# Patient Record
Sex: Female | Born: 1965
Health system: Southern US, Community
[De-identification: ages and names within clinical notes are randomized; demographics above are authoritative.]

## PROBLEM LIST (undated history)

## (undated) DIAGNOSIS — N840 Polyp of corpus uteri: Secondary | ICD-10-CM

## (undated) DIAGNOSIS — N816 Rectocele: Secondary | ICD-10-CM

## (undated) DIAGNOSIS — I10 Essential (primary) hypertension: Secondary | ICD-10-CM

## (undated) DIAGNOSIS — I471 Supraventricular tachycardia: Secondary | ICD-10-CM

## (undated) DIAGNOSIS — T4145XA Adverse effect of unspecified anesthetic, initial encounter: Secondary | ICD-10-CM

## (undated) DIAGNOSIS — T8859XA Other complications of anesthesia, initial encounter: Secondary | ICD-10-CM

## (undated) DIAGNOSIS — I4719 Other supraventricular tachycardia: Secondary | ICD-10-CM

## (undated) DIAGNOSIS — E785 Hyperlipidemia, unspecified: Secondary | ICD-10-CM

## (undated) DIAGNOSIS — Z9889 Other specified postprocedural states: Secondary | ICD-10-CM

## (undated) DIAGNOSIS — R112 Nausea with vomiting, unspecified: Secondary | ICD-10-CM

## (undated) DIAGNOSIS — I4891 Unspecified atrial fibrillation: Secondary | ICD-10-CM

## (undated) DIAGNOSIS — I639 Cerebral infarction, unspecified: Secondary | ICD-10-CM

## (undated) DIAGNOSIS — C801 Malignant (primary) neoplasm, unspecified: Secondary | ICD-10-CM

## (undated) HISTORY — PX: COLONOSCOPY: SHX174

## (undated) HISTORY — DX: Other supraventricular tachycardia: I47.19

## (undated) HISTORY — DX: Unspecified atrial fibrillation: I48.91

## (undated) HISTORY — PX: LEEP: SHX91

## (undated) HISTORY — DX: Rectocele: N81.6

## (undated) HISTORY — DX: Cerebral infarction, unspecified: I63.9

## (undated) HISTORY — DX: Polyp of corpus uteri: N84.0

## (undated) HISTORY — DX: Essential (primary) hypertension: I10

## (undated) HISTORY — DX: Supraventricular tachycardia: I47.1

## (undated) HISTORY — PX: TUBAL LIGATION: SHX77

## (undated) HISTORY — PX: HYSTEROSCOPY: SHX211

## (undated) HISTORY — DX: Hyperlipidemia, unspecified: E78.5

## (undated) HISTORY — PX: OTHER SURGICAL HISTORY: SHX169

## (undated) HISTORY — PX: WISDOM TOOTH EXTRACTION: SHX21

---

## 1998-04-06 ENCOUNTER — Ambulatory Visit (HOSPITAL_COMMUNITY): Admission: RE | Admit: 1998-04-06 | Discharge: 1998-04-06 | Payer: Self-pay | Admitting: Emergency Medicine

## 1998-08-11 ENCOUNTER — Other Ambulatory Visit: Admission: RE | Admit: 1998-08-11 | Discharge: 1998-08-11 | Payer: Self-pay | Admitting: Obstetrics and Gynecology

## 1999-03-10 ENCOUNTER — Other Ambulatory Visit: Admission: RE | Admit: 1999-03-10 | Discharge: 1999-03-10 | Payer: Self-pay | Admitting: Obstetrics and Gynecology

## 1999-07-14 ENCOUNTER — Encounter: Admission: RE | Admit: 1999-07-14 | Discharge: 1999-10-12 | Payer: Self-pay | Admitting: Obstetrics and Gynecology

## 1999-09-23 ENCOUNTER — Inpatient Hospital Stay (HOSPITAL_COMMUNITY): Admission: AD | Admit: 1999-09-23 | Discharge: 1999-09-26 | Payer: Self-pay | Admitting: Obstetrics and Gynecology

## 1999-09-28 ENCOUNTER — Encounter: Admission: RE | Admit: 1999-09-28 | Discharge: 1999-12-27 | Payer: Self-pay | Admitting: Obstetrics and Gynecology

## 1999-11-13 ENCOUNTER — Other Ambulatory Visit: Admission: RE | Admit: 1999-11-13 | Discharge: 1999-11-13 | Payer: Self-pay | Admitting: Obstetrics and Gynecology

## 2000-12-15 ENCOUNTER — Other Ambulatory Visit: Admission: RE | Admit: 2000-12-15 | Discharge: 2000-12-15 | Payer: Self-pay | Admitting: Obstetrics and Gynecology

## 2002-01-23 ENCOUNTER — Other Ambulatory Visit: Admission: RE | Admit: 2002-01-23 | Discharge: 2002-01-23 | Payer: Self-pay | Admitting: Obstetrics and Gynecology

## 2002-04-28 ENCOUNTER — Emergency Department (HOSPITAL_COMMUNITY): Admission: EM | Admit: 2002-04-28 | Discharge: 2002-04-29 | Payer: Self-pay | Admitting: Emergency Medicine

## 2003-03-27 ENCOUNTER — Other Ambulatory Visit: Admission: RE | Admit: 2003-03-27 | Discharge: 2003-03-27 | Payer: Self-pay | Admitting: Obstetrics and Gynecology

## 2003-09-09 ENCOUNTER — Ambulatory Visit (HOSPITAL_COMMUNITY): Admission: RE | Admit: 2003-09-09 | Discharge: 2003-09-09 | Payer: Self-pay | Admitting: Orthopedic Surgery

## 2004-04-23 ENCOUNTER — Other Ambulatory Visit: Admission: RE | Admit: 2004-04-23 | Discharge: 2004-04-23 | Payer: Self-pay | Admitting: Obstetrics and Gynecology

## 2004-05-19 ENCOUNTER — Encounter: Admission: RE | Admit: 2004-05-19 | Discharge: 2004-05-19 | Payer: Self-pay | Admitting: Surgery

## 2005-05-27 ENCOUNTER — Inpatient Hospital Stay (HOSPITAL_COMMUNITY): Admission: EM | Admit: 2005-05-27 | Discharge: 2005-05-27 | Payer: Self-pay | Admitting: Emergency Medicine

## 2005-05-27 ENCOUNTER — Encounter (INDEPENDENT_AMBULATORY_CARE_PROVIDER_SITE_OTHER): Payer: Self-pay | Admitting: Cardiology

## 2005-06-09 ENCOUNTER — Other Ambulatory Visit: Admission: RE | Admit: 2005-06-09 | Discharge: 2005-06-09 | Payer: Self-pay | Admitting: Obstetrics and Gynecology

## 2005-09-29 ENCOUNTER — Ambulatory Visit (HOSPITAL_COMMUNITY): Admission: RE | Admit: 2005-09-29 | Discharge: 2005-09-29 | Payer: Self-pay | Admitting: Orthopedic Surgery

## 2007-02-12 ENCOUNTER — Emergency Department (HOSPITAL_COMMUNITY): Admission: EM | Admit: 2007-02-12 | Discharge: 2007-02-12 | Payer: Self-pay | Admitting: Emergency Medicine

## 2007-05-23 ENCOUNTER — Ambulatory Visit: Payer: Self-pay | Admitting: Internal Medicine

## 2007-05-23 LAB — CONVERTED CEMR LAB
Basophils Absolute: 0 10*3/uL (ref 0.0–0.1)
Chloride: 104 meq/L (ref 96–112)
GFR calc non Af Amer: 73 mL/min
INR: 1 (ref 0.8–1.0)
Lymphocytes Relative: 39.7 % (ref 12.0–46.0)
MCHC: 33 g/dL (ref 30.0–36.0)
MCV: 87.5 fL (ref 78.0–100.0)
Monocytes Absolute: 0.4 10*3/uL (ref 0.1–1.0)
Neutro Abs: 3.8 10*3/uL (ref 1.4–7.7)
Potassium: 3.5 meq/L (ref 3.5–5.1)
RBC: 4.52 M/uL (ref 3.87–5.11)
RDW: 12.6 % (ref 11.5–14.6)
Sodium: 139 meq/L (ref 135–145)

## 2007-05-30 ENCOUNTER — Ambulatory Visit: Payer: Self-pay | Admitting: Internal Medicine

## 2007-05-30 ENCOUNTER — Ambulatory Visit (HOSPITAL_COMMUNITY): Admission: RE | Admit: 2007-05-30 | Discharge: 2007-05-30 | Payer: Self-pay | Admitting: Internal Medicine

## 2007-06-23 ENCOUNTER — Ambulatory Visit: Payer: Self-pay | Admitting: Internal Medicine

## 2007-07-27 ENCOUNTER — Ambulatory Visit: Payer: Self-pay | Admitting: Internal Medicine

## 2008-02-05 ENCOUNTER — Emergency Department (HOSPITAL_COMMUNITY): Admission: EM | Admit: 2008-02-05 | Discharge: 2008-02-05 | Payer: Self-pay | Admitting: Family Medicine

## 2008-03-07 ENCOUNTER — Ambulatory Visit: Payer: Self-pay | Admitting: Internal Medicine

## 2008-06-20 ENCOUNTER — Encounter (INDEPENDENT_AMBULATORY_CARE_PROVIDER_SITE_OTHER): Payer: Self-pay | Admitting: Obstetrics and Gynecology

## 2008-06-20 ENCOUNTER — Ambulatory Visit (HOSPITAL_COMMUNITY): Admission: RE | Admit: 2008-06-20 | Discharge: 2008-06-21 | Payer: Self-pay | Admitting: Obstetrics and Gynecology

## 2008-10-20 DIAGNOSIS — N840 Polyp of corpus uteri: Secondary | ICD-10-CM | POA: Insufficient documentation

## 2008-10-20 DIAGNOSIS — I10 Essential (primary) hypertension: Secondary | ICD-10-CM | POA: Insufficient documentation

## 2008-10-20 DIAGNOSIS — N816 Rectocele: Secondary | ICD-10-CM | POA: Insufficient documentation

## 2008-10-20 DIAGNOSIS — I480 Paroxysmal atrial fibrillation: Secondary | ICD-10-CM | POA: Insufficient documentation

## 2008-10-25 ENCOUNTER — Ambulatory Visit: Payer: Self-pay | Admitting: Internal Medicine

## 2009-04-09 ENCOUNTER — Emergency Department (HOSPITAL_COMMUNITY): Admission: EM | Admit: 2009-04-09 | Discharge: 2009-04-09 | Payer: Self-pay | Admitting: Emergency Medicine

## 2009-05-21 ENCOUNTER — Ambulatory Visit: Payer: Self-pay | Admitting: Internal Medicine

## 2009-07-04 ENCOUNTER — Telehealth (INDEPENDENT_AMBULATORY_CARE_PROVIDER_SITE_OTHER): Payer: Self-pay | Admitting: *Deleted

## 2010-03-22 ENCOUNTER — Emergency Department (HOSPITAL_COMMUNITY)
Admission: EM | Admit: 2010-03-22 | Discharge: 2010-03-22 | Payer: Self-pay | Source: Home / Self Care | Admitting: Family Medicine

## 2010-03-24 NOTE — Assessment & Plan Note (Signed)
Summary: f1y per pt call/lg  Medications Added FUROSEMIDE 40 MG TABS (FUROSEMIDE) Take one tablet by mouth daily. FISH OIL   OIL (FISH OIL) 2 tabs by mouth once daily NIACIN CR 500 MG CR-CAPS (NIACIN) 2 tabs once daily GLUCOSAMINE-CHONDROITIN   CAPS (GLUCOSAMINE-CHONDROIT-VIT C-MN) once daily      Allergies Added: NKDA  History of Present Illness: [name] ([name]) returns today for follow up of her atrial fibrillation.  She has a h/o atrial tachycardia and one documented episode of atrial fibrillation.  She is frustrated by her inability to lose weight.  She is about to change from night shift to day shift and is getting remarried.  No c/p or sob.  She notes peripheral edema.  She admits to dietary indiscretion with sodium.  Current Medications (verified): 1)  Aspirin Ec 325 Mg Tbec (Aspirin) .... Take One Tablet By Mouth Daily 2)  Furosemide 40 Mg Tabs (Furosemide) .... Take One Tablet By Mouth Daily. 3)  Metoprolol Tartrate 50 Mg Tabs (Metoprolol Tartrate) .... Take One Tablet By Mouth Once Daily 4)  Flecainide Acetate 50 Mg Tabs (Flecainide Acetate) .... Take One Tablet By Mouth Once Daily 5)  Fish Oil   Oil (Fish Oil) .... 2 Tabs By Mouth Once Daily 6)  Multivitamins   Tabs (Multiple Vitamin) .... Take One Tablet By Mouth Once Daily. 7)  Niacin Cr 500 Mg Cr-Caps (Niacin) .... 2 Tabs Once Daily 8)  Xanax 0.25 Mg Tabs (Alprazolam) .... As Needed 9)  Glucosamine-Chondroitin   Caps (Glucosamine-Chondroit-Vit C-Mn) .... Once Daily  Allergies (verified): No Known Drug Allergies  Past History:  Past Medical History: Last updated: 10/20/2008 Current Problems:  ATRIAL FIBRILLATION, HX OF (ICD-V12.59) HYPERTENSION (ICD-401.9) ENDOMETRIAL POLYP (ICD-621.0) RECTOCELE WITHOUT MENTION OF UTERINE PROLAPSE (WJX-914.78)    Past Surgical History: Last updated: 10/20/2008  DATE OF PROCEDURE:  06/20/2008   OPERATIVE PROCEDURES:  Laparoscopic bilateral tubal fulguration.   Hysteroscopy  with resection of endometrial polyp along with endometrial   curettings.  Posterior repair.  Colpopexy using an extra vaginal   approach.  An insertion of mesh for repair of the posterior vaginal wall   defect using the Pinnacle system.     SURGEON:  Juluis Mire, MD    DATE OF PROCEDURE:  05/30/2007   PROCEDURE PERFORMED:  Electrophysiologic study utilizing isoproterenol.     PHYSICIAN:  Doylene Canning. Ladona Ridgel, MD  RESULTS:  This invasive electrophysiologic study utilizing isoproterenol   demonstrates no inducible SVT despite an aggressive pacing protocol   utilizing programmed atrial and ventricular stimulation as well as rapid   atrial and ventricular pacing.  The patient will be treated going   forward with medical therapy.         Review of Systems  The patient denies chest pain, syncope, dyspnea on exertion, and peripheral edema.    Vital Signs:  Patient profile:   45 year old female Height:      67 inches Weight:      248 pounds BMI:     38.98 Pulse rate:   60 / minute BP sitting:   120 / 82  (left arm)  Vitals Entered By: Laurance Flatten CMA (May 21, 2009 11:28 AM)  Physical Exam  General:  Obese middle aged woman NAD. Head:  normocephalic and atraumatic Eyes:  PERRLA/EOM intact; conjunctiva and lids normal. Mouth:  Teeth, gums and palate normal. Oral mucosa normal. Neck:  Neck supple, no JVD. No masses, thyromegaly or abnormal cervical nodes. Lungs:  Clear bilaterally to auscultation with no wheezes, rales, or rhonchi. Heart:  RRR with normal S1 and S2.  PMI is not enlarged or laterally displaced. No  murmurs. Abdomen:  Bowel sounds positive; abdomen soft and non-tender without masses, organomegaly, or hernias noted. No hepatosplenomegaly. Msk:  Back normal, normal gait. Muscle strength and tone normal. Pulses:  pulses normal in all 4 extremities Extremities:  1+ peripheral edema bilaterally. Neurologic:  Alert and oriented x 3.   EKG  Procedure date:   05/21/2009  Findings:      Normal sinus rhythm with rate of:  60.  Impression & Recommendations:  Problem # 1:  ATRIAL FIBRILLATION, HX OF (ICD-V12.59) She appears to be maintaining NSR.  I have asked that she continue her current meds.  Her updated medication list for this problem includes:    Aspirin Ec 325 Mg Tbec (Aspirin) .Marland Kitchen... Take one tablet by mouth daily    Metoprolol Tartrate 50 Mg Tabs (Metoprolol tartrate) .Marland Kitchen... Take one tablet by mouth once daily    Flecainide Acetate 50 Mg Tabs (Flecainide acetate) .Marland Kitchen... Take one tablet by mouth once daily  Problem # 2:  HYPERTENSION (ICD-401.9) Her blood pressure is well controlled today.  A low sodium diet is recommended. Her updated medication list for this problem includes:    Aspirin Ec 325 Mg Tbec (Aspirin) .Marland Kitchen... Take one tablet by mouth daily    Furosemide 40 Mg Tabs (Furosemide) .Marland Kitchen... Take one tablet by mouth daily.    Metoprolol Tartrate 50 Mg Tabs (Metoprolol tartrate) .Marland Kitchen... Take one tablet by mouth once daily  Patient Instructions: 1)  Your physician recommends that you schedule a follow-up appointment in: 6 months with Dr Ladona Ridgel

## 2010-03-24 NOTE — Progress Notes (Signed)
   Faxed 12 lead over to [facility] [phone] [facility]  Jul 04, 2009 9:52 AM

## 2010-06-03 LAB — HCG, SERUM, QUALITATIVE: Preg, Serum: NEGATIVE

## 2010-06-03 LAB — CBC
HCT: 37.4 % (ref 36.0–46.0)
MCHC: 34.3 g/dL (ref 30.0–36.0)
MCV: 89.7 fL (ref 78.0–100.0)
Platelets: 219 10*3/uL (ref 150–400)
RBC: 4.17 MIL/uL (ref 3.87–5.11)
RDW: 13.3 % (ref 11.5–15.5)

## 2010-06-03 LAB — BASIC METABOLIC PANEL
BUN: 13 mg/dL (ref 6–23)
CO2: 29 mEq/L (ref 19–32)
Calcium: 8.8 mg/dL (ref 8.4–10.5)
Chloride: 102 mEq/L (ref 96–112)
Creatinine, Ser: 0.67 mg/dL (ref 0.4–1.2)
Glucose, Bld: 107 mg/dL — ABNORMAL HIGH (ref 70–99)

## 2010-06-24 ENCOUNTER — Encounter: Payer: Self-pay | Admitting: Physician Assistant

## 2010-06-24 ENCOUNTER — Ambulatory Visit (INDEPENDENT_AMBULATORY_CARE_PROVIDER_SITE_OTHER): Payer: 59 | Admitting: Physician Assistant

## 2010-06-24 VITALS — BP 128/72 | HR 64 | Resp 18 | Ht 67.0 in | Wt 256.8 lb

## 2010-06-24 DIAGNOSIS — R079 Chest pain, unspecified: Secondary | ICD-10-CM

## 2010-06-24 DIAGNOSIS — Z8679 Personal history of other diseases of the circulatory system: Secondary | ICD-10-CM

## 2010-06-24 DIAGNOSIS — R0602 Shortness of breath: Secondary | ICD-10-CM

## 2010-06-24 MED ORDER — FAMOTIDINE 20 MG PO TABS: 20.0000 mg | ORAL_TABLET | Freq: Two times a day (BID) | ORAL | Status: DC

## 2010-06-24 NOTE — Progress Notes (Signed)
History of Present Illness: Primary Electrophysiologist:  Dr. Lewayne Bunting  Cindy Faulkner is a 45 y.o. female with a h/o atrial tachycardia and one documented episode of atrial fibrillation.  She presents today for evaluation of dyspnea with exertion.  Over the last several months she has noticed increased dyspnea with exertion.  She probably describes NYHA class IIb symptoms.  She denies true orthopnea.  However, she has had some feelings of shortness of breath with lying flat.  She denies PND.  She feels that she's had increased pedal edema.  She denies syncope.  She has occasional lightheadedness that seems to be associated with her PVCs.  She does note an uncomfortable feeling in her chest.  This comes and goes.  She does not really notice anything that makes it worse.  She seems to notice more at rest.  She does not really note chest discomfort with exertion.  She also notes a fullness in her throat.  This is fairly constant.  It seems to be worse in the mornings.  It is not necessarily associated with exertion.  She has occasional right and left arm pain, but this is not associated with her other symptoms.  Past Medical History  Diagnosis Date  . Atrial fibrillation     a. echo 4/07: EF 60%  . Hypertension   . Endometrial polyp   . Rectocele     WITHOUT MENTION OF UTERINE PROLAPSE  . Atrial tachycardia     a. s/p EPS 4/09: no inducible SVT - med Tx continued    Past Surgical History  Procedure Date  . Hysteroscopy     Current Outpatient Prescriptions  Medication Sig Dispense Refill  . ALPRAZolam (XANAX) 0.25 MG tablet Take 0.25 mg by mouth at bedtime as needed.        Marland Kitchen aspirin 325 MG tablet Take 325 mg by mouth daily.        . calcium carbonate (CALCIUM 600) 600 MG TABS Take 600 mg by mouth daily.        . flecainide (TAMBOCOR) 50 MG tablet Take 50 mg by mouth daily.        . furosemide (LASIX) 40 MG tablet Take 40 mg by mouth daily.        . Glucosamine-Chondroitin (GLUCOSAMINE  CHONDR COMPLEX PO) Take 1 capsule by mouth daily.        . metoprolol (LOPRESSOR) 50 MG tablet Take 50 mg by mouth daily.        . Multiple Vitamin (MULTIVITAMIN) tablet Take 1 tablet by mouth daily.        . niacin (NIASPAN) 500 MG CR tablet Take 1,000 mg by mouth at bedtime.        . Omega-3 Fatty Acids (FISH OIL) 1000 MG CAPS Take 2 capsules by mouth daily.          No Known Allergies  History  Substance Use Topics  . Smoking status: Former Smoker -- 10 years  . Smokeless tobacco: Never Used  . Alcohol Use: No    Family History  Problem Relation Age of Onset  . Hypertension Father   . Diabetes Father   . Stroke Father   . Hypertension Mother   . Stroke Mother     ROS:  Please see the history of present illness.  She does note a weight gain of about 10 pounds over the last several months.  Actually, her current weight is much greater than what she has been in the past.  She denies fevers or chills.  She denies cough.  She denies dysphagia.  She does note slightly increased dyspepsia recently.  This is promptly relieved by taking TUMS.  She denies melena or hematochezia.  She denies dysuria.  She denies vomiting or diarrhea.  She has had some constipation.  No skin or hair changes.  All other systems reviewed and negative.  Vital Signs: BP 128/72  Pulse 64  Resp 18  Ht 5\' 7"  (1.702 m)  Wt 256 lb 12.8 oz (116.484 kg)  BMI 40.22 kg/m2  PHYSICAL EXAM: Well nourished, well developed, in no acute distress HEENT: normal Neck: no JVD Endocrine: No thyromegaly Vascular: No carotid bruits Cardiac:  normal S1, S2; RRR; no murmur, No gallops Lungs:  clear to auscultation bilaterally, no wheezing, rhonchi or rales Abd: soft, nontender, no hepatomegaly, No splenomegaly Ext: Trace, nonpitting bilateral edema Skin: warm and dry Neuro:  CNs 2-12 intact, no focal abnormalities noted Psych: Normal affect  EKG:  Normal sinus rhythm, heart rate 64, normal axis, no ischemic  changes  ASSESSMENT AND PLAN:

## 2010-06-24 NOTE — Assessment & Plan Note (Addendum)
Etiology not entirely clear.  She does not look particularly volume overloaded.  Some of her symptoms are slightly suggestive of heart failure.  I will arrange a 2-D echocardiogram to assess her LV function and structure as well as to look at her right heart pressures.  I will obtain a basic metabolic panel and BNP as well as a CBC and TSH.  I will also have her undergo a stress Myoview to rule out ischemia.  She will follow up in one month with either Dr. Ladona Ridgel or myself.  If workup is negative, I suspect her symptoms can all be explained by obesity and deconditioning.

## 2010-06-24 NOTE — Assessment & Plan Note (Signed)
As noted, proceed with stress Myoview.  I have asked her to start taking Pepcid 20 mg twice a day as she has had increased dyspepsia.

## 2010-06-24 NOTE — Assessment & Plan Note (Signed)
Currently maintaining normal sinus rhythm.  If testing for ischemic heart disease is positive, her flecainide will need to be discontinued.

## 2010-06-24 NOTE — Patient Instructions (Addendum)
Your physician has recommended you make the following change in your medication: PEPCID 20 MG 1 TAB TWICE DAILY  Your physician has requested that you have en exercise stress Myoview 786.50, 786.05. For further information please visit [email or web address] Please follow instruction sheet, as given.  Your physician has requested that you have an echocardiogram 786.05. Echocardiography is a painless test that uses sound waves to create images of your heart. It provides your doctor with information about the size and shape of your heart and how well your heart's chambers and valves are working. This procedure takes approximately one hour. There are no restrictions for this procedure.    Your physician recommends that you schedule a follow-up appointment in: 1 MONTH WITH EITHER SCOTT WEAVER, PA-C SAME DAY DR. Ladona Ridgel IS IN THE OFFICE OR DR. Ladona Ridgel

## 2010-06-25 LAB — BASIC METABOLIC PANEL
BUN: 15 mg/dL (ref 6–23)
CO2: 27 mEq/L (ref 19–32)
Chloride: 100 mEq/L (ref 96–112)
Potassium: 4 mEq/L (ref 3.5–5.1)
Sodium: 135 mEq/L (ref 135–145)

## 2010-06-25 LAB — CBC WITH DIFFERENTIAL/PLATELET
Eosinophils Absolute: 0.2 10*3/uL (ref 0.0–0.7)
HCT: 37.2 % (ref 36.0–46.0)
Hemoglobin: 12.8 g/dL (ref 12.0–15.0)
MCHC: 34.4 g/dL (ref 30.0–36.0)
MCV: 88.2 fl (ref 78.0–100.0)
Monocytes Absolute: 0.6 10*3/uL (ref 0.1–1.0)
Monocytes Relative: 7.3 % (ref 3.0–12.0)
RDW: 13.4 % (ref 11.5–14.6)

## 2010-06-26 LAB — BRAIN NATRIURETIC PEPTIDE: Pro B Natriuretic peptide (BNP): 77 pg/mL (ref 0.0–100.0)

## 2010-07-01 ENCOUNTER — Ambulatory Visit (HOSPITAL_BASED_OUTPATIENT_CLINIC_OR_DEPARTMENT_OTHER): Payer: 59 | Admitting: Radiology

## 2010-07-01 ENCOUNTER — Ambulatory Visit (HOSPITAL_COMMUNITY): Payer: 59 | Attending: Internal Medicine | Admitting: Radiology

## 2010-07-01 DIAGNOSIS — R0602 Shortness of breath: Secondary | ICD-10-CM

## 2010-07-01 DIAGNOSIS — R0789 Other chest pain: Secondary | ICD-10-CM

## 2010-07-01 DIAGNOSIS — R079 Chest pain, unspecified: Secondary | ICD-10-CM

## 2010-07-01 DIAGNOSIS — I059 Rheumatic mitral valve disease, unspecified: Secondary | ICD-10-CM | POA: Insufficient documentation

## 2010-07-01 MED ORDER — TECHNETIUM TC 99M TETROFOSMIN IV KIT
10.3000 | PACK | Freq: Once | INTRAVENOUS | Status: AC | PRN
  Administered 2010-07-01: 10 via INTRAVENOUS

## 2010-07-01 MED ORDER — TECHNETIUM TC 99M TETROFOSMIN IV KIT
33.0000 | PACK | Freq: Once | INTRAVENOUS | Status: AC | PRN
  Administered 2010-07-01: 33 via INTRAVENOUS

## 2010-07-01 NOTE — Progress Notes (Signed)
[facility] SITE 3 NUCLEAR MED [address] [place] [address] [phone]  Cardiology Nuclear Med Study  Cindy Faulkner is a 45 y.o. female [id number] [birth year]   Nuclear Med Background Indication for Stress Test:  Evaluation for Ischemia History:  '07 Echo:EF=60% Cardiac Risk Factors: History of Smoking, Hypertension and Obesity  Symptoms:  Chest Pressure with fullness in throat.  (last episode of chest discomfort is now, 4/10), DOE, Fatigue, Light-Headedness and Palpitations   Nuclear Pre-Procedure Caffeine/Decaff Intake:  None NPO After: 9:00pm   Lungs:  Clear.  O2 sat 98% on RA. IV 0.9% NS with Angio Cath:  20g  IV Site: R Wrist  IV Started by:  Stanton Kidney, EMT-P  Chest Size (in):  42 Cup Size: DD  Height: 5\' 7"  (1.702 m)  Weight:  250 lb (113.399 kg)  BMI:  Body mass index is 39.16 kg/(m^2). Tech Comments:  Metoprolol held > 24 hours, per patient.    Nuclear Med Study 1 or 2 day study: 1 day  Stress Test Type:  Stress  Reading MD: Marca Ancona, MD  Order Authorizing Provider:  Lewayne Bunting, MD  Resting Radionuclide: Technetium 55m Tetrofosmin  Resting Radionuclide Dose: 10.3 mCi   Stress Radionuclide:  Technetium 64m Tetrofosmin  Stress Radionuclide Dose: 33.0 mCi           Stress Protocol Rest HR: 70 Stress HR: 157  Rest BP: 131/93 Stress BP: 217/66  Exercise Time (min): 6:00 METS: 7.2   Predicted Max HR: 174 bpm % Max HR: 90.23 bpm Rate Pressure Product: 21308   Dose of Adenosine (mg):  n/a Dose of Lexiscan: n/a mg  Dose of Atropine (mg): n/a Dose of Dobutamine: n/a mcg/kg/min (at max HR)  Stress Test Technologist: Smiley Houseman, CMA-N  Nuclear Technologist:  Domenic Polite, CNMT     Rest Procedure:  Myocardial perfusion imaging was performed at rest 45 minutes following the intravenous administration of Technetium 46m Tetrofosmin. Rest ECG: No acute changes.  Stress Procedure:  The patient exercised for six minutes on the  treadmill utilizing the Bruce protocol.  The patient stopped due to dyspnea.  She denied any chest pain, but did c/o throat pressure, 6/10.  There were nonspecific ST-T wave changes.  She had a mild hypertensive response to exercise, 217/66.  Technetium 12m Tetrofosmin was injected at peak exercise and myocardial perfusion imaging was performed after a brief delay. Stress ECG: Insignificant upsloping ST segment depression.  QPS Raw Data Images:  Normal; no motion artifact; normal heart/lung ratio. Stress Images:  Normal homogeneous uptake in all areas of the myocardium. Rest Images:  Normal homogeneous uptake in all areas of the myocardium. Subtraction (SDS):  There is no evidence of scar or ischemia. Transient Ischemic Dilatation (Normal <1.22):  0.92 Lung/Heart Ratio (Normal <0.45):  0.28  Quantitative Gated Spect Images QGS EDV:  101 ml QGS ESV:  28 ml QGS cine images:  Normal Wall Motion QGS EF: 72%  Impression Exercise Capacity:  Below average BP Response:  Hypertensive blood pressure response. Clinical Symptoms:  Short of breath, throat pressure with exertion.  ECG Impression:  Insignificant upsloping ST segment depression. Comparison with Prior Nuclear Study: No previous nuclear study performed  Overall Impression:  Normal stress nuclear study.  [name] [employer]

## 2010-07-02 ENCOUNTER — Telehealth: Payer: Self-pay | Admitting: Internal Medicine

## 2010-07-02 NOTE — Telephone Encounter (Signed)
Pt is at physical therapy but she will keep a eye out on her phone so she can answer

## 2010-07-02 NOTE — Progress Notes (Signed)
COPY ROUTED TO DR. TAYLOR.Cindy Faulkner ° °

## 2010-07-03 ENCOUNTER — Telehealth: Payer: Self-pay | Admitting: Physician Assistant

## 2010-07-03 NOTE — Telephone Encounter (Signed)
Stress test is normal

## 2010-07-06 ENCOUNTER — Telehealth: Payer: Self-pay | Admitting: *Deleted

## 2010-07-06 NOTE — Telephone Encounter (Signed)
See phone note. Danielle Rankin

## 2010-07-07 NOTE — H&P (Signed)
NAMEMIRZA, KIDNEY                ACCOUNT NO.:  [id number]   MEDICAL RECORD NO.:  [id number]          PATIENT TYPE:  AMB   LOCATION:  SDC                           FACILITY:  [facility]   PHYSICIAN:  Juluis Mire, M.D.   DATE OF BIRTH:  08/16/1965   DATE OF ADMISSION:  06/20/2008  DATE OF DISCHARGE:                              HISTORY & PHYSICAL   This is a 45 year old gravida 2, para 1, abortus 1 female who presents  for laparoscopic bilateral tubal ligation.  Hysteroscopy, resection of  polyp.  Posterior repair using Pinnacle system.   In relation to present admission, first the patient had been having some  abnormal uterine bleeding, underwent a saline infusion ultrasound with a  finding of a small endometrial polyp.  We are going to proceed with  hysteroscopic resection.  She had a symptomatic rectocele that has  become increasingly symptomatic for her.  We had discussed the options  at the present time.  She would like to have posterior repair.  We are  going to do the Pinnacle system to hopefully give her support to the  uterus.  She is desirous of permanent sterilization.  Alternatives for  birth control have been discussed.  The potential irreversibility of  sterilization explained.  Failure rate of 1 in 200 described, the  failure can be in the form of ectopic pregnancy requiring further  surgical management.   The patient had described stress incontinence.  We did urodynamic  testing, could never demonstrate any urinary leakage at the present  time, so we are not going to proceed with a mid urethral sling.   In terms of allergies, she has no known drug allergies.   MEDICATIONS:  She is on low-dose aspirin daily, Toprol-XL 100 mg daily,  hydrochlorothiazide 25 mg a day.  She is on some supplementations.  Uses  Xanax as needed.   PAST MEDICAL HISTORY:  Significant in that she does have a history of  atrial fibrillation under active management, also a history of  hypertension.   PAST SURGICAL HISTORY:  She had a pilonidal cyst removed in 81.  Otherwise, she has had one vaginal delivery.   SOCIAL HISTORY:  Reveals no tobacco or alcohol use.   FAMILY HISTORY:  Father with a history of diabetes as well as  hypertension.  Mother with a history of hypertension.   REVIEW OF SYSTEMS:  Noncontributory.   PHYSICAL EXAMINATION:  VITAL SIGNS:  The patient is afebrile, stable  vital signs.  HEENT:  The patient is normocephalic.  Pupils equal, round, and reactive  to light and accommodation.  Extraocular movements were intact.  Sclerae  and conjunctivae are clear.  Oropharynx clear.  NECK:  Without thyromegaly.  BREASTS:  No discrete masses.  LUNGS:  Clear.  CARDIOVASCULAR:  Regular rate without murmurs or gallops.  ABDOMEN:  Benign.  No mass, organomegaly, or tenderness.  PELVIC:  Normal external genitalia.  Vaginal mucosa reveals a very  prominent rectocele, mild cystocele.  Uterus is well supported.  Cervix  unremarkable.  Uterus was of normal size, shape, and contour.  Adnexa  free of masses or tenderness.  EXTREMITIES:  Trace edema.  NEUROLOGIC:  Grossly within normal limits.   IMPRESSION:  1. Symptomatic rectocele.  2. Desirous of permanent sterilization.  3. Endometrial polyp.  4. Hypertension.  5. History of atrial fibrillation.   PLAN:  The patient to undergo laparoscopic bilateral tubal ligation  followed by hysteroscopy and posterior repair.  Risks of surgery have  been discussed including the risk of infection.  The risk of hemorrhage  could require transfusion with the risk of AIDS or hepatitis.  Risk of  injury to adjacent organs including bladder, bowel, ureters that could  require further exploratory surgery.  Risk of deep venous thrombosis  with pulmonary embolus.  With the Pinnacle system, the mesh can erode  requiring further surgical management.  We are passing this through the  sacrospinous ligaments and could have problems  with lower back  discomfort on a chronic basis.  The recurrence of pelvic relaxation in  other areas have been discussed.  Potential worsening of stress  incontinence that could require further surgical management explained.  The patient does understand indications, risks, and other alternatives.      Juluis Mire, M.D.  Electronically Signed     JSM/MEDQ  D:  06/20/2008  T:  06/20/2008  Job:  [id number]

## 2010-07-07 NOTE — Letter (Signed)
May 23, 2007    Corky Crafts, MD  [address]  [place], [place]  [address]   RE:  ZARINAH, OVIATT  MRN:  [id number]  /  DOB:  1965/03/23   Dear Vonna Kotyk:   Upon your kind referral, I had the pleasure of evaluating your patient  and I am pleased to offer my findings.  I saw Laurence Folz in the office  today.  Enclosed is a copy of my progress note that details my findings  and recommendations.   Thank you for referring Aram Beecham for EP evaluation.  As you know she  is a very pleasant middle-aged woman with a history of  tachypalpitations.  She works as a Engineer, civil (consulting) in the [employer].  The patient had carries a diagnosis of atrial fibrillation,  though her symptoms certainly are more suggestive of nonsustained atrial  tachycardia with very frequent PACs.  The patient has nicely enough  obtained multiple recordings and is here today with them.  She has never  had syncope.  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  She notes these still  start and stop suddenly.  Sometimes they last for long periods.  Sometimes they are short lived.  She has never had frank syncope.  Her  exam has been well characterized.  Her EKG is interesting in that it  demonstrates runs of nonsustained atrial tachycardia originating in what  appears to be high in the right atrium based on the positive P-waves and  the negative P-waves and AVF.   I discussed the treatment options with the patient including the risks,  benefits, goals and expectations of catheter ablation, as well as an  alternative of initiating flecainide therapy in  conjunction with a beta blocker.  She is considering her options and  will call us if she plans to proceed with ablation.   Thanks again for referring Ms. Amick for EP evaluation.    Sincerely,      Doylene Canning.  Ladona Ridgel, MD  Electronically Signed    GWT/MedQ  DD: 05/23/2007  DT: 05/24/2007  Job #: [id number]

## 2010-07-07 NOTE — Discharge Summary (Signed)
[name], Cindy Faulkner                ACCOUNT NO.:  [id number]   MEDICAL RECORD NO.:  [id number]          PATIENT TYPE:  OIB   LOCATION:  9312                          FACILITY:  [facility]   PHYSICIAN:  Juluis Mire, M.D.   DATE OF BIRTH:  [birth year]   DATE OF ADMISSION:  06/20/2008  DATE OF DISCHARGE:  06/21/2008                               DISCHARGE SUMMARY   ADMITTING DIAGNOSES:  1. Multipara, desires sterility.  2. Endometrial polyp.  3. Rectocele.   DISCHARGE DIAGNOSES:  1. Multipara, desires sterility.  2. Endometrial polyp.  3. Rectocele.   OPERATING PROCEDURE:  Laparoscopic bilateral tubal ligation.  Hysteroscopy, resection of polyp.  Posterior repair using the Pinnacle  System.   For complete history and physical, see dictated note.   COURSE IN THE HOSPITAL:  The patient underwent the above-noted surgery.  She did excellent, was discharged home on her first postop day.  At that  time, the vaginal pack had been removed.  Her Foley was discontinued.  She had no active vaginal bleeding.  Her postop hemoglobin was 11.1.  She was tolerating the diet.  Had no nausea or vomiting.   In terms of complications, none were encountered during her stay in the  hospital.  The patient returned home in stable condition.   DISPOSITION:  Routine postop instructions were given.  She is to avoid  heavy lifting, vaginal entry, or driving a car.  She is to watch for  signs of infection, nausea, vomiting, or active vaginal bleeding.   INSTRUCTIONS:  Signs of deep venous thrombosis and pulmonary embolus.  Will be discharged home on Tylox as needed for pain.  Plan to follow up  in the office in 1 week.      Juluis Mire, M.D.  Electronically Signed     JSM/MEDQ  D:  06/21/2008  T:  06/21/2008  Job:  [id number]

## 2010-07-07 NOTE — Assessment & Plan Note (Signed)
[facility]                         ELECTROPHYSIOLOGY OFFICE NOTE   Cindy Faulkner, Cindy Faulkner                       MRN:          [id number]  DATE:05/23/2007                            DOB:          April 05, 1965    [name] is referred today by Dr. Everette Rank for evaluation of  tachypalpitations and documented runs of atrial tachycardia.  She  carries a diagnosis of atrial fibrillation, although I do not see any of  this on her EKGs today.  The patient states her episodes of palpitations  start and stop suddenly.  She has never had frank syncope.  She does  occasionally get dizzy and have to sit down.  She she that these  episodes have been present now for over a year, and she has had up  titration of her beta blockers during this time.   CURRENT MEDICATIONS:  1. Metoprolol 100 mg of the long-acting version daily.  2. Aspirin 325.  3. Fish oil supplements.  4. HCTZ 25 a day.  5. Lasix 40 a day p.r.n.   SOCIAL HISTORY:  The patient works as a Engineer, civil (consulting).  She is widowed.  She has  one child.   FAMILY HISTORY:  Notable for atrial fibrillation.   REVIEW OF SYSTEMS:  As noted in the HPI.  She does have very mild  dyspnea with exertion.  Otherwise, no specific symptoms except as noted  above.  She denies any arthritic complaints.  Otherwise, her review of  systems was negative.   PHYSICAL EXAMINATION:  GENERAL:  She is a pleasant, well-appearing, 45-  year-old woman in no acute distress.  VITAL SIGNS:  Blood pressure was 136/88, pulse was 50 and irregular,  respirations were 18, temperature 98.  HEENT:  Normocephalic and atraumatic.  Pupils were equal and round.  Oropharynx was moist.  Sclerae were anicteric.  NECK:  No jugular distention.  No thyromegaly.  Trachea is midline.  Carotids 2+ and symmetric.  LUNGS:  Clear bilaterally to auscultation.  No wheezes, rales or rhonchi  were present.  There was no increased work of breathing.  CARDIOVASCULAR:   Regular rate rhythm with normal S1 and S2.  There was  an occasional premature beat noted.  The PMI was not enlarged, nor was  it laterally displaced.  ABDOMEN:  Soft, nontender, nondistended.  There is no organomegaly.  The  bowel sounds were present.  There was no rebound or guarding.  EXTREMITIES:  No cyanosis, clubbing or edema.  Pulse were 2+ and  symmetric.  NEUROLOGIC:  Alert and x3.  Cranial nerves intact.  Strength was 5/5 and  symmetric.  The EKG demonstrates sinus rhythm with frequent PACs.   IMPRESSION:  1. Symptomatic atrial tachycardia.  2. May be a diagnosis of atrial fibrillation, although this has not      been corroborated.  3. Hypertension.   DISCUSSION:  I have discussed treatment options with the patient.  The  patient may well have an atrial opathy making catheter ablation less  likely to be effective.  She may also have a left atrial tachycardia  which would be  more risky for consideration for ablation.  I have  discussed treatment options, specifically catheter ablation versus  initiation of flecainide therapy.  The patient would like to proceed  with ablation if at all possible because she would like to come off some  of her beta blocker and is not interested in additional antiarrhythmic  drug therapy at the present time.  This will be scheduled as early as  possible at a convenient time.     Doylene Canning. Ladona Ridgel, MD  Electronically Signed    GWT/MedQ  DD: 05/23/2007  DT: 05/24/2007  Job #: [id number]   cc:   Corky Crafts, MD  Tally Joe, M.D.

## 2010-07-07 NOTE — Assessment & Plan Note (Signed)
[facility]                         ELECTROPHYSIOLOGY OFFICE NOTE   SINA, SUMPTER                       MRN:          [id number]  DATE:06/23/2007                            DOB:          [birth year]    [name] returns today for follow-up.  She is very pleasant 45 year old  woman with a history of tachy palpitations in atrial tachycardia who had  been on beta blockers and felt fatigued and weak and was referred for EP  testing where she had no inducible SVT despite utilizing a very  aggressive pacing protocol which included isoproterenol. She returns  today for follow-up.  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 She notes that she has had a couple episodes of  palpitations and SVT.  She has  caught some on the monitor and is here  today for additional evaluation. She also states problems with  peripheral edema, though this has been under fairly good control with  oral HCTZ.   CURRENT MEDICATIONS:  1. Aspirin 325 a day.  2. HCTZ 25 a day.  3. Calcium.  4. Fish oil supplement.  5. Niacin 500 twice a day.  6. Lopressor 50 twice a day.   PHYSICAL EXAM:  She is a pleasant, well-appearing, 45 year old woman in  no acute distress.  Blood pressure was 126/77, the pulse was 61 and  regular, the respirations were 18. The weight was 247 pounds.  NECK:  Revealed no jugular venous distention.  LUNGS:  Clear bilaterally to auscultation.  No wheezes, rales or rhonchi  were present.  CARDIOVASCULAR:  Regular rate and rhythm with normal S1  and S2.  ABDOMEN:  Soft and nontender.  EXTREMITIES:  Demonstrated no cyanosis, clubbing or edema.  Pulses were  2+ and symmetric.   Review of her telemetry strips from [facility] demonstrated  bursts of atrial tachycardia as well as atrial bigeminy.  The rates of  these are typically about 150 beats per minute when she goes into it.   IMPRESSION:  1. Paroxysmal atrial  tachycardia.  2. Moderate obesity.  3. Dyslipidemia.  4. Peripheral edema.   DISCUSSION:  The patient today is given a prescription for flecainide 50  mg twice daily.  My expectation is that we may require more than this to  help control her palpitations but we will see her low-dose works for  her.  I will plan to see the patient back in the office in 6 weeks.     Doylene Canning. Ladona Ridgel, MD  Electronically Signed    GWT/MedQ  DD: 06/23/2007  DT: 06/23/2007  Job #: [id number]   cc:   Corky Crafts, MD  Tally Joe, M.D.

## 2010-07-07 NOTE — Op Note (Signed)
NAMELONNETTE, SHRODE                ACCOUNT NO.:  [id number]   MEDICAL RECORD NO.:  [id number]          PATIENT TYPE:  OIB   LOCATION:  2852                         FACILITY:  [facility]   PHYSICIAN:  Doylene Canning. Ladona Ridgel, MD    DATE OF BIRTH:  11-24-1965   DATE OF PROCEDURE:  05/30/2007  DATE OF DISCHARGE:                               OPERATIVE REPORT   PROCEDURE PERFORMED:  Electrophysiologic study utilizing isoproterenol.   INDICATIONS:  Symptomatic SVT.   INTRODUCTION:  The patient is a 45 year old woman with a history of  tachy palpitations and documented atrial tachycardia.  These episodes  start and stop suddenly, typically are nonsustained though she has had  sustained periods of heart racing.  She has been on medical therapy for  these with beta blockers and calcium blockers but would like, if at all  possible, to get off these medications and is now referred for catheter  ablation.   PROCEDURE:  After informed consent was obtained, the patient taken to  the diagnostic EP lab in a fasting state.  After the usual preparation  and draping, intravenous fentanyl and midazolam were given for sedation.  A 6-French flexible catheter was inserted percutaneously into the right  jugular vein and advanced to the coronary sinus.  A 7-French 20-pole  halo catheter was inserted percutaneously in the right femoral vein and  advanced to the right atrium.  A 5-French quadripolar catheter was  inserted percutaneously in the right femoral vein and advanced to the  His bundle region.  After measuring the basic intervals, rapid atrial  pacing was carried out from the coronary sinus in the high right atrium  and stepwise decreased down to 410 milliseconds where AV Wenckebach was  observed.  Additional decrements were carried out down to 210  milliseconds.  There was no inducible SVT.  Next, programmed atrial  stimulation was carried out from the coronary sinus and high right  atrium with base/cycle  length of 500 milliseconds in the S1-S2 interval  stepwise decreased down to 360 milliseconds where the AV node ERP was  observed.  During programmed atrial stimulation, there no AH jumps and  no echo beats.  There was no inducible SVT.  Additional programmed  atrial stimulation was carried out down to 290 milliseconds where atrial  refractoriness was observed.  At this point, isoproterenol was infused  at rates between 1-4 mcg per minute.  Initially, the His bundle catheter  was advanced into the right ventricle and pacing was carried out from  the right ventricle.  The VA Wenckebach cycle length on isoproterenol  was 300 milliseconds, and during rapid ventricular pacing, the atrial  activation was midline and decremental.  Programmed ventricular  stimulation was then carried out from the right ventricular apex at  base/cycle length of 500 milliseconds with the S1-S2 interval stepwise  decreased down to 270 milliseconds where ventricular refractoriness was  observed.  During programmed ventricular stimulation, there was no  inducible SVT and the atrial activation was again midline and  decremental.  Next, isoproterenol was continued and additional rapid  atrial pacing was  carried out and down to 200 milliseconds.  There were  no inducible arrhythmias.  There were rare PVC noted.  At this point,  the isoproterenol was discontinued and additional rapid atrial pacing  and programmed atrial stimulation was carried out, but despite an  aggressive pacing protocol, there was no inducible SVT.  At this point,  the catheters were removed, hemostasis was assured, and the patient was  returned to her room in satisfactory condition.   COMPLICATIONS:  There were no immediate procedure complications.   RESULTS:  This invasive electrophysiologic study utilizing isoproterenol  demonstrates no inducible SVT despite an aggressive pacing protocol  utilizing programmed atrial and ventricular stimulation as  well as rapid  atrial and ventricular pacing.  The patient will be treated going  forward with medical therapy.      Doylene Canning. Ladona Ridgel, MD  Electronically Signed     GWT/MEDQ  D:  05/30/2007  T:  05/30/2007  Job:  [id number]   cc:   Corky Crafts, MD

## 2010-07-07 NOTE — Op Note (Signed)
NAMETIJAH, HANE                ACCOUNT NO.:  [id number]   MEDICAL RECORD NO.:  [id number]          PATIENT TYPE:  OIB   LOCATION:  9312                          FACILITY:  [facility]   PHYSICIAN:  Juluis Mire, M.D.   DATE OF BIRTH:  07-27-1965   DATE OF PROCEDURE:  06/20/2008  DATE OF DISCHARGE:  06/21/2008                               OPERATIVE REPORT   PREOPERATIVE DIAGNOSES:  1. Multiparity, desires sterility.  2. Endometrial polyp.  3. Symptomatic rectocele.   POSTOPERATIVE DIAGNOSES:  1. Multiparity, desires sterility.  2. Endometrial polyp.  3. Symptomatic rectocele.   OPERATIVE PROCEDURES:  Laparoscopic bilateral tubal fulguration.  Hysteroscopy with resection of endometrial polyp along with endometrial  curettings.  Posterior repair.  Colpopexy using an extra vaginal  approach.  An insertion of mesh for repair of the posterior vaginal wall  defect using the Pinnacle system.   SURGEON:  Juluis Mire, MD   ANESTHESIA:  General endotracheal.   ESTIMATED BLOOD LOSS:  100-200 mL.   PACKS:  Vaginal pack.   DRAINS:  Urethral Foley.   INTRAOPERATIVE BLOOD PLACED:  None.   COMPLICATIONS:  None.   INDICATIONS:  Dictated in the history and physical.   PROCEDURE IN DETAIL:  The patient was taken to the OR, placed supine  position.  After satisfactory level of general endotracheal anesthesia  was obtained, the patient was placed in a dorsal lithotomy position  using the Allen stirrups.  The abdomen, perineum, and vagina were  prepped out of Betadine.  Bladder with in-and-out catheterization.  A  Hulka tenaculum was put in place and secured.  The patient was draped in  sterile field.  A subumbilical incision was made with a knife.  The  Veress needle was inserted to the abdominal cavity without difficulty.  Abdomen was insufflated with approximately 3 L of carbon dioxide.  The  operating laparoscope was introduced.  Visualization revealed no  evidence of injury to  adjacent organs.  Upper abdomen including the  liver tip and the gallbladder were clear.  I could not see the appendix.  I could see the cecum.  The appendix may have been retrocecal.  There is  no other abnormalities noted.  The uterus was upper limits of normal  size, irregular.  Adnexa unremarkable.  We did have one implant of  endometriosis in the cul-de-sac area.  Using the bipolar, both tubes  were coagulated for approximately a distance of 3 cm on each side.  Coagulation was continued until the resistance read 0.  We then re-  coagulated the same second segment of tube completely desiccating the  tube.  Coagulation did experienced out to the mesosalpinx.  At this  point in time, both tubes were adequately coagulated.  The abdomen was  deinflated with carbon dioxide.  Laparoscope was removed.  Subumbilical  incision was closed with interrupted subcuticular of 4-0 Vicryl.   The patient's legs were repositioned and the Hulka tenaculum was then  taken out.  The speculum was placed in the vaginal vault.  Cervix was  grasped with single-tooth tenaculum.  The  uterus sounded approximately 9  cm.  Cervix was serially dilated to a size 31 Pratt dilator.  Operative  hysteroscope was introduced.  Intrauterine cavity was distended using  sorbitol.  A left and posterior wall polyp was noted.  This was resected  and sent for pathological review.  Multiple endometrial biopsies were  also obtained along with curettings.  Total deficit was 25 mL.  No signs  of perforation or active vaginal bleeding.  The hysteroscope and single-  tooth tenaculum were then taken out.   At this point in time, the incision was made in the perineum and the  skin was dissected up to the vaginal opening and excised.  We then did  hydrodissection with 1% Xylocaine with epinephrine under the vaginal  mucosa posteriorly.  We then developed the space between the vaginal  mucosa and underlying rectum and perirectal fascia.   We dissected out to  the sacrospinous ligaments on each side.  I did not make any further  incisions in the vaginal mucosa in order not to expose much of the mesh.  At this point in time, the Pinnacle was brought in place.  We attached  the arms to the Capio system.  We identified the right sacrospinous  ligament.  We were able to using the Capio to insert the arm and bring  it through on that side on the left side.  Again, we identified the  sacrospinous ligament, inserted the mesh with the Capio.  However, the  suture pulled through the plastic dilator.  We were unable to advance  the mesh arm on that side.  We clipped off the needles on each side and  removed the Pinnacle system intact.  We did not have another Pinnacle  system, but they had the anterior mesh system that was brought in.  We  trimmed this up to look like the Pinnacle system and removed the lower  mesh arms.  We then again using the Capio, we were able to pass the  needle through the right and left sacrospinous ligaments and we were  able to advance the dilators in the mesh arms.  We then identified the  cervical ring.  We inserted 3 sutures of 0 Vicryl and these were  attached to the distal portion of the mesh and tied down.  We then  tightened up the arms with getting good elevation of the vaginal cuff as  well as the uterus.  We then trimmed the mesh at the vaginal opening,  secured it to the perineal body and we put sutures out laterally to make  sure the mesh laid flat.  It was in there nicely.  There was no active  bleeding.  We closed the remaining vaginal mucosa up to the introitus.  We rebuilt the perineal body with interrupted sutures of 2-0 Vicryl.  The skin was closed with running subcuticular of 2-0 Vicryl.  Vaginal  pack was put in place along with Foley, adequate clear urine output was  obtained.  The patient was taken out the dorsal lithotomy position.  Once alert and extubated, transferred to recovery  room in good  condition.  Sponge, instrument, and needle count was correct by  circulating nurse x2.      Juluis Mire, M.D.  Electronically Signed     JSM/MEDQ  D:  06/21/2008  T:  06/21/2008  Job:  [id number]

## 2010-07-07 NOTE — Assessment & Plan Note (Signed)
[facility]                         ELECTROPHYSIOLOGY OFFICE NOTE   KELLY, RANIERI                       MRN:          [id number]  DATE:03/07/2008                            DOB:          10-27-1965    [name] returns today for followup.  She is a very pleasant middle-  aged woman with a history of symptomatic palpitations and nonsustained  atrial tachycardia, which has been fairly severely symptomatic.  She  underwent EP study several months ago.  At that time, had no inducible-  sustained SVT.  We placed her on low-dose flecainide and her  palpitations are much improved.  She also continues on her beta-blocker.  She states that in the last 6 months, she has scarcely felt the [place] or  PVC.  She denies chest pain.  She is anxious today because her father is  ill and is in the hospital.   MEDICATIONS:  1. Aspirin 325 a day.  2. HCTZ 25 a day.  3. Calcium 600 a day.  4. Glucosamine twice daily.  5. Fish oil 1200 mg twice daily.  6. Multivitamin.  7. Niacin 500 twice a day.  8. Lopressor 50 twice daily.  9. Flecainide 50 a day.   PHYSICAL EXAMINATION:  GENERAL:  She is a pleasant well-appearing woman,  in no distress.  VITAL SIGNS:  Blood pressure today was 110/70, the pulse was 54 and  regular, the respirations were 18, and her weight was 229 pounds, which  is down 15 pounds from her visit back in June.  NECK:  No jugular venous distention.  LUNGS:  Clear bilaterally to auscultation.  No wheezes, rales, or  rhonchi are present.  CARDIOVASCULAR:  Regular rate and rhythm.  Normal S1 and S2.  ABDOMEN:  Soft, nontender.  EXTREMITIES:  No edema.   EKG demonstrates sinus bradycardia.   IMPRESSION:  1. Symptomatic premature atrial contractions and nonsustained atrial      tachycardia.  2. Hypertension.  3. Sinus bradycardia (asymptomatic).   DISCUSSION:  Overall, the patient is stable.  She is tolerating  combination of flecainide and  beta-blocker therapy very nicely.  She is  basically asymptomatic at this point.  I have  encouraged her to continue working on trying to lose weight and I will  see the patient back for EP followup rather in 1 year.     Doylene Canning. Ladona Ridgel, MD  Electronically Signed    GWT/MedQ  DD: 03/07/2008  DT: 03/08/2008  Job #: [id number]

## 2010-07-07 NOTE — Assessment & Plan Note (Signed)
[facility]                         ELECTROPHYSIOLOGY OFFICE NOTE   Cindy Faulkner, Cindy Faulkner                       MRN:          [id number]  DATE:07/27/2007                            DOB:          [birth year]    [name] is a 45 year old woman with a history of tachy palpitations  and atrial tachycardia which has been nonsustained but very symptomatic  who underwent EP study several months ago with the time she had no  inducible SVT.  The patient was seen by me back in early May, and  because of continued palpitations, she was placed on low-dose  flecainide.  Initially on 50 twice a day, she felt fatigued when taken  in conjunction with the Lopressor 50 twice a day, and we decreased  flecainide to 1 tablet daily.  She returns today for follow-up.  She has  had very little in way of palpitations since then.  She denies chest  pain.  She has very minimal dyspnea with exertion.  She does have  fatigue.   MEDICATIONS:  1. Aspirin 325 a day.  2. Hydrochlorothiazide 25 a day.  3. Calcium 600 twice daily.  4. Fish oil supplement.  5. Multiple vitamin.  6. Niacin 500 twice a day.  7. Lopressor 50 twice a day.  8. Flecainide 50 mg daily.   On exam, she is a pleasant well-appearing middle-aged woman in no  distress.  Blood pressure was 104/68, pulse 68 and regular, respirations  were 18.  Weight was 243 pounds.  NECK:  Revealed no jugular venous distention.  LUNGS:  Were clear bilaterally to auscultation.  No wheezes, rales or  rhonchi were present.  CARDIOVASCULAR EXAM:  Revealed a regular rate and rhythm.  Normal S1-S2.  EXTREMITIES:  Demonstrate no edema.   IMPRESSION:  1. Paroxysmal atrial tachycardia.  2. Hypertension.  3. Dyslipidemia.  4. Obesity.   DISCUSSION:  [name] appears to be stable with her present medical  regimen for her atrial tachycardia.  I have recommended that we continue  to watch her and see her back in the office in  several months.     Doylene Canning. Ladona Ridgel, MD  Electronically Signed   GWT/MedQ  DD: 07/27/2007  DT: 07/27/2007  Job #: [id number]

## 2010-07-10 NOTE — Consult Note (Signed)
Cindy Faulkner, Cindy Faulkner                ACCOUNT NO.:  [id number]   MEDICAL RECORD NO.:  [id number]          PATIENT TYPE:  INP   LOCATION:  1426                         FACILITY:  [facility]   PHYSICIAN:  Corky Crafts, MDDATE OF BIRTH:  21-Oct-1965   DATE OF CONSULTATION:  05/27/2005  DATE OF DISCHARGE:                                   CONSULTATION   PRIMARY CARE PHYSICIAN:  Tally Joe, M.D.   REASON FOR CONSULTATION:  Atrial fibrillation.   HISTORY OF PRESENT ILLNESS:  The patient is a 45 year old woman who had been  treated for PVCs in the past with a beta-blocker.  Last night, she noticed  an irregular heart beat.  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  She  tried to rest at home for a few hours, but the irregular rhythm would not go  away.  Her heart rate remained in the 80s to 100s.  She came to the  emergency room and received Cardizem IV.  She converted four hours after  that back to normal sinus rhythm.  She did not have any chest pain or  shortness of breath.  Currently, she is feeling well and in normal sinus  rhythm.   PAST MEDICAL HISTORY:  1.  Gestational diabetes that has completely resolved after pregnancy.  2.  Pregnancy-related hypertension which was related to her weight.  Once      she lost the weight, her blood pressure has normalized.   PAST SURGICAL HISTORY:  Pilonidal cyst removal.   CURRENT MEDICATIONS:  1.  Atenolol/hydrochlorothiazide 50/25 mg daily.  2.  Fish oil.  3.  Aspirin 81 mg daily.   ALLERGIES:  NO KNOWN DRUG ALLERGIES.   SOCIAL HISTORY:  The patient does not smoke.  She does not drink.  She does  not use any illegal drugs.  She works as a Engineer, civil (consulting), typically working night  shifts.  She only rarely drinks caffeine.   FAMILY HISTORY:  Mother had a CVA.  Multiple people have had atrial  fibrillation in her family.  Few have had congestive heart failure as well.   REVIEW OF SYSTEMS:  No recent fevers or chills.  No chest pain, no shortness  of breath.  No recent alcohol or caffeine use.  No rash.  No nausea or  vomiting.  No focal weakness.  Review of systems significant only for  palpitations, as described above.   PHYSICAL EXAMINATION:  VITAL SIGNS:  Heart rate 72 and regular, blood  pressure 136/79, and she is afebrile.  GENERAL:  The patient is awake and alert and in no apparent distress.  HEENT:  Head:  Normocephalic, atraumatic.  Eyes:  Extraocular movements  intact.  Mouth:  Oropharynx is clear.  NECK:  Supple.  No JVD, no carotid bruits.  CARDIOVASCULAR:  Regular rate and rhythm.  S1 and S2.  Soft early systolic  murmur.  LUNGS:  Clear to auscultation bilaterally.  ABDOMEN:  Soft, nontender, nondistended.  EXTREMITIES:  No pretibial pitting edema.  NEUROLOGIC:  No focal motor or sensory deficits.  SKIN:  No rash.  BACK:  No kyphosis, scoliosis.  PSYCHIATRIC:  Normal mood and affect.   LABORATORY DATA:  Potassium 3.2, creatinine 0.7.  Hematocrit 38.3.  TSH is  pending.  Cardiac markers negative at this point.  Troponin all less than  0.05.   EKG done last night in the emergency room showed atrial fibrillation with a  heart rate of 85.  No pathologic Q waves.  No ST-T wave changes.  Preliminary echocardiogram results showed normal left ventricular function.  Full results to follow.   MEDICAL DECISION MAKING:  A 45 year old with new onset atrial fibrillation.   PLAN:  1.  Cardiac.  She has converted spontaneously back to normal sinus rhythm.      I recommended that she continue her atenolol and hydrochlorothiazide at      current dosage.  The hydrochlorothiazide is because she has significant      lower extremity swelling when she is on her feet.  The atenolol was      initially intended for treatment of her PVCs.  She states that her blood      pressure has significantly improved since loosing weight.  She required      medications  at the time of her pregnancy but after loosing weight did      not require any medications.  2.  I discussed with her that the second issue regarding atrial fibrillation      is stroke prevention.  Given that she has had this borderline      hypertension, we did consider Coumadin.  The patient does prefer to use      aspirin 325 mg p.o. daily at this time.  I did explain to her that if      her atrial fibrillation recurs or becomes more frequent or if she does      develop diabetes or any type of left ventricular systolic dysfunction,      the urgency for Coumadin would be greater.  We will follow her blood      pressure closely.  If her blood pressure is in fact elevated      persistently, we will have to reconsider starting Coumadin.  3.  I will follow up with the patient in the office.  4.  We will also evaluate her cardiac structure with saline contrast study      to evaluate for any intracardiac shunting.      Corky Crafts, MD  Electronically Signed     JSV/MEDQ  D:  05/27/2005  T:  05/28/2005  Job:  [id number]

## 2010-07-10 NOTE — H&P (Signed)
Cindy Faulkner, Cindy Faulkner                ACCOUNT NO.:  [id number]   MEDICAL RECORD NO.:  [id number]          PATIENT TYPE:  INP   LOCATION:  0102                         [facility]ACILITY:  [facility]   PHYSICIAN:  Jackie Plum, M.D.DATE OF BIRTH:  June 13, 1965   DATE OF ADMISSION:  05/27/2005  DATE OF DISCHARGE:                                HISTORY & PHYSICAL   CHIEF COMPLAINT:  Palpitations.   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  She presents with a 1-day  history of palpitations.  She therefore came to the ED for further  evaluation.  At the emergency room, the patient was noted to be in atrial  fibrillation.  Cardizem drip was initiated and she subsequently converted to  sinus rhythm and we have been involved in her management and care in  consultation of workup and management of her new-onset atrial fibrillation.   According to the patient, she had been in her usual state of health until  sometime this evening.  Actually, she had gotten up and had gone to the gym  to do some elliptical exercises.  At the beginning of exercise, she felt a  little bit short-winded, but this was very transient.  Post exercise, later  in the afternoon, she went on a bike ride with her family and did not have  any other problems, except for continued  palpitations.  She is a Engineer, civil (consulting) and  she palpated her pulse and she felt that she was likely in atrial  fibrillation, because of irregularity of her rhythm, and therefore came to  the ED.  The patient denies any history of true chest pain.  She has some  fullness in her neck.  No dizziness.  No PND.  No orthopnea.  No syncope or  presyncope.  No other abdominal pain, nausea, vomiting or diaphoresis.   PAST MEDICAL HISTORY:  History of hypertension.  She had had some problems  with PVCs recently for which her primary Eldin Bonsell started her on atenolol  with significant improvement.  She  denies any previous history of heart  disease, diabetes or dyslipidemia.   FAMILY HISTORY:  Family history is positive for atrial fibrillation and  heart disease.   SOCIAL HISTORY:  The patient does not smoke cigarettes nor drink alcohol.   REVIEW OF SYSTEMS:  Review of systems as noted above, otherwise  unremarkable.   PHYSICAL EXAMINATION:  VITAL SIGNS:  BP 123/77, pulse 69, respirations 18,  temperature 98.1 degrees Fahrenheit.  GENERAL:  The patient was comfortable-looking, not in any acute  cardiopulmonary distress.  HEENT:  Normocephalic, atraumatic.  Pupils were equal, round and reactive.  __________ .  Oropharynx moist.  NECK:  Supple.  No JVD.  LUNGS:  Clear to auscultation.  CARDIAC:  Regular rate and rhythm.  No gallop and no murmur appreciated.  ABDOMEN:  Full, soft and nontender.  Bowel sounds present.  EXTREMITIES:  No cyanosis.  No edema.   LABORATORY AND ACCESSORY CLINICAL DATA:  Twelve-lead EKG has shows atrial  fibrillation.  WBC count 9.0, hemoglobin 12.9, hematocrit 38.3, MCV 86.5, platelet count  320,000.  Point-of-care myoglobin 92.6, CK-MB 3.2, point-of-care troponin I  less than 0.05.  Sodium 137, potassium 3.2.  CO2 26, glucose 104, BUN 16,  creatinine 0.7, calcium 8.6, total protein 6.3, albumin 3.7, AST 27, ALT 18,  alkaline phosphatase 40, total bilirubin 0.7.   X-ray of the chest:  No cardiomegaly.  No acute infiltrate.   IMPRESSION:  New-onset atrial fibrillation.  The patient has converted to  sinus rhythm.  The cause of her atrial fibrillation is unclear.  The patient  denies any previous medical/cardiovascular history.  She does not have any  symptoms of thyroid disease.  She does not smoke cigarettes.   PLAN:  Plan is to admit the patient to a telemetry bed.  She is currently in  sinus rhythm.  Her heart rate is about 69 on Cardizem 5 mg/hr and we will  switch her to p.o. Cardizem.  We will start her on aspirin and get a 2-D  echocardiogram  as well as TSH and cycle her enzymes to completion.  The  patient will be evaluated in the morning by Cardiology and a decision made  as to whether to do any further cardiac workup, i.e., stress test, if deemed  appropriate, as well as evaluation of her cause of the atrial fibrillation.  I will not anticoagulate this patient at this time.      Jackie Plum, M.D.  Electronically Signed     GO/MEDQ  D:  05/27/2005  T:  05/27/2005  Job:  [id number]

## 2010-07-29 ENCOUNTER — Ambulatory Visit (INDEPENDENT_AMBULATORY_CARE_PROVIDER_SITE_OTHER): Payer: 59 | Admitting: Internal Medicine

## 2010-07-29 ENCOUNTER — Encounter: Payer: Self-pay | Admitting: Internal Medicine

## 2010-07-29 DIAGNOSIS — R0602 Shortness of breath: Secondary | ICD-10-CM

## 2010-07-29 DIAGNOSIS — I10 Essential (primary) hypertension: Secondary | ICD-10-CM

## 2010-07-29 NOTE — Assessment & Plan Note (Signed)
Her symptoms have improved. Her echo and stress test are unremarkable. The patient is deconditioned and obese. I've asked the patient start walking on a daily basis. She states that she will try this.

## 2010-07-29 NOTE — Progress Notes (Signed)
HPI Cindy Faulkner returns today for followup. She is a 44 year old woman with a history of atrial arrhythmias and one documented episode of atrial fibrillation. She has hypertension. She's been controlled from her arrhythmia perspective with low-dose flecainide. The patient has unfortunately had problems with her weight. Over the last 5 years she has continued to gain weight on regular basis. She is now morbidly obese. Despite this she is able to get around and has tried to start walking more frequently. She recently was seen by Mr. Alben Spittle in our practice and had complaints of shortness of breath and a stress test and a echocardiogram were obtained demonstrating no evidence of ischemia with normal perfusion and normal left ventricular systolic function. No Known Allergies   Current Outpatient Prescriptions  Medication Sig Dispense Refill  . ALPRAZolam (XANAX) 0.25 MG tablet Take 0.25 mg by mouth at bedtime as needed.        Marland Kitchen aspirin 325 MG tablet Take 325 mg by mouth daily.        . calcium carbonate (CALCIUM 600) 600 MG TABS Take 600 mg by mouth daily.        . famotidine (PEPCID) 20 MG tablet Take 1 tablet (20 mg total) by mouth 2 (two) times daily.  60 tablet  11  . flecainide (TAMBOCOR) 50 MG tablet Take 50 mg by mouth daily.        . furosemide (LASIX) 40 MG tablet Take 40 mg by mouth daily.        . Glucosamine-Chondroitin (GLUCOSAMINE CHONDR COMPLEX PO) Take 1 capsule by mouth daily.        . LevOCARNitine (CARNITINE, L,) POWD by Does not apply route.        . metoprolol (LOPRESSOR) 50 MG tablet Take 50 mg by mouth daily.        . Multiple Vitamin (MULTIVITAMIN) tablet Take 1 tablet by mouth daily.        . niacin (NIASPAN) 500 MG CR tablet Take 1,000 mg by mouth at bedtime.        . Omega-3 Fatty Acids (FISH OIL) 1000 MG CAPS Take 2 capsules by mouth daily.           Past Medical History  Diagnosis Date  . Atrial fibrillation     a. echo 4/07: EF 60%  . Hypertension   . Endometrial  polyp   . Rectocele     WITHOUT MENTION OF UTERINE PROLAPSE  . Atrial tachycardia     a. s/p EPS 4/09: no inducible SVT - med Tx continued    ROS:   All systems reviewed and negative except as noted in the HPI.   Past Surgical History  Procedure Date  . Hysteroscopy      Family History  Problem Relation Age of Onset  . Hypertension Father   . Diabetes Father   . Stroke Father   . Hypertension Mother   . Stroke Mother      History   Social History  . Marital Status: Married    Spouse Name: N/A    Number of Children: N/A  . Years of Education: N/A   Occupational History  . Not on file.   Social History Main Topics  . Smoking status: Former Smoker -- 10 years  . Smokeless tobacco: Never Used  . Alcohol Use: No  . Drug Use: No  . Sexually Active: Not on file   Other Topics Concern  . Not on file   Social History Narrative  .  No narrative on file     BP 146/78  Pulse 75  Resp 18  Ht 5\' 7"  (1.702 m)  Wt 256 lb (116.121 kg)  BMI 40.10 kg/m2  Physical Exam:  Obese, well appearing NAD HEENT: Unremarkable Neck:  No JVD, no thyromegally Lymphatics:  No adenopathy Back:  No CVA tenderness Lungs:  Clear HEART:  Regular rate rhythm, no murmurs, no rubs, no clicks Abd:  positive bowel sounds, no organomegally, no rebound, no guarding Ext:  2 plus pulses, no edema, no cyanosis, no clubbing Skin:  No rashes no nodules Neuro:  CN II through XII intact, motor grossly intact  Assess/Plan:

## 2010-07-29 NOTE — Patient Instructions (Signed)
Your physician wants you to follow-up in: 12 months with Dr. Taylor. You will receive a reminder letter in the mail two months in advance. If you don't receive a letter, please call our office to schedule the follow-up appointment.    

## 2010-07-29 NOTE — Assessment & Plan Note (Signed)
Her blood pressure appears to be elevated today. I've asked that she reduce her sodium intake and reduce her weight and exercise daily.

## 2010-09-12 ENCOUNTER — Other Ambulatory Visit: Payer: Self-pay | Admitting: Internal Medicine

## 2010-11-27 ENCOUNTER — Emergency Department (HOSPITAL_COMMUNITY): Payer: 59

## 2010-11-27 ENCOUNTER — Emergency Department (HOSPITAL_COMMUNITY)
Admission: EM | Admit: 2010-11-27 | Discharge: 2010-11-27 | Disposition: A | Discharge status: Home / Self Care | Payer: 59 | Attending: Emergency Medicine | Admitting: Emergency Medicine

## 2010-11-27 DIAGNOSIS — R51 Headache: Secondary | ICD-10-CM | POA: Insufficient documentation

## 2010-11-27 DIAGNOSIS — I10 Essential (primary) hypertension: Secondary | ICD-10-CM | POA: Insufficient documentation

## 2010-11-27 DIAGNOSIS — M7989 Other specified soft tissue disorders: Secondary | ICD-10-CM | POA: Insufficient documentation

## 2010-11-27 DIAGNOSIS — Z79899 Other long term (current) drug therapy: Secondary | ICD-10-CM | POA: Insufficient documentation

## 2010-11-27 DIAGNOSIS — R609 Edema, unspecified: Secondary | ICD-10-CM | POA: Insufficient documentation

## 2010-11-27 LAB — COMPREHENSIVE METABOLIC PANEL
AST: 29 U/L (ref 0–37)
Alkaline Phosphatase: 52 U/L (ref 39–117)
CO2: 26 mEq/L (ref 19–32)
Chloride: 103 mEq/L (ref 96–112)
Creatinine, Ser: 0.56 mg/dL (ref 0.50–1.10)
GFR calc non Af Amer: >90 mL/min (ref >90)
Potassium: 3.9 mEq/L (ref 3.5–5.1)
Total Bilirubin: 0.3 mg/dL (ref 0.3–1.2)

## 2010-11-27 LAB — CBC
HCT: 37.1 % (ref 36.0–46.0)
Hemoglobin: 12.6 g/dL (ref 12.0–15.0)
MCHC: 34 g/dL (ref 30.0–36.0)
MCV: 85.1 fL (ref 78.0–100.0)
RDW: 13.5 % (ref 11.5–15.5)
WBC: 8.7 10*3/uL (ref 4.0–10.5)

## 2010-11-27 LAB — URINALYSIS, ROUTINE W REFLEX MICROSCOPIC
Bilirubin Urine: NEGATIVE
Glucose, UA: NEGATIVE mg/dL
Ketones, ur: NEGATIVE mg/dL
Protein, ur: NEGATIVE mg/dL
Urobilinogen, UA: 0.2 mg/dL (ref 0.0–1.0)

## 2010-11-27 LAB — POCT I-STAT TROPONIN I: Troponin i, poc: 0 ng/mL (ref 0.00–0.08)

## 2010-11-27 LAB — PRO B NATRIURETIC PEPTIDE: Pro B Natriuretic peptide (BNP): 109.6 pg/mL (ref 0–125)

## 2010-11-27 LAB — DIFFERENTIAL
Eosinophils Relative: 2 % (ref 0–5)
Lymphocytes Relative: 28 % (ref 12–46)
Lymphs Abs: 2.5 10*3/uL (ref 0.7–4.0)
Monocytes Absolute: 0.7 10*3/uL (ref 0.1–1.0)
Neutro Abs: 5.4 10*3/uL (ref 1.7–7.7)

## 2010-11-27 LAB — URINE MICROSCOPIC-ADD ON

## 2010-11-29 LAB — URINE CULTURE
Colony Count: >=100000
Culture  Setup Time: 201210052150

## 2010-11-30 ENCOUNTER — Telehealth: Payer: Self-pay | Admitting: Internal Medicine

## 2010-11-30 NOTE — Telephone Encounter (Signed)
For about 1 1/2 weeks she has been having girls take her pressure She went to [facility] ER  198/108 and they gave her Furosemide 40mg  and gave her Reglan 10mg IV and Toridol 30mg  IV and Benadryl 25mg  IV 160/80  She was told to call us She has been watching Sodium over the weekend and today her pressure is 148/80  They increased her Furosemide to 40mg  bid  She is going to continue to monitor her BP and I will discuss with Dr Ladona Ridgel on Thurs  If it starts to climb back up again I will have her come in and see the PA

## 2010-11-30 NOTE — Telephone Encounter (Signed)
Pt called. She said her BP has been elevated for about a week 150-180/88-102 She went to er 198/108 [place] and it was 160/80 before she left. She still had headache and nausea please call and let her know if she needs additional meds or what she needs to do

## 2010-12-08 ENCOUNTER — Other Ambulatory Visit: Payer: Self-pay | Admitting: Internal Medicine

## 2010-12-14 ENCOUNTER — Telehealth: Payer: Self-pay | Admitting: Internal Medicine

## 2010-12-14 NOTE — Telephone Encounter (Signed)
Pt needs refill of furosimide 40 mg, to take bid 90 day supply, [facility] [place]

## 2010-12-16 MED ORDER — FUROSEMIDE 40 MG PO TABS: 40.0000 mg | ORAL_TABLET | Freq: Two times a day (BID) | ORAL | Status: DC

## 2010-12-28 ENCOUNTER — Telehealth: Payer: Self-pay | Admitting: Internal Medicine

## 2010-12-28 NOTE — Telephone Encounter (Signed)
It had been put on hold and will refill for her

## 2010-12-28 NOTE — Telephone Encounter (Signed)
New message Pt said she has called [facility] about lasix rx  And they have told her it is not there.please call her back

## 2011-02-13 ENCOUNTER — Other Ambulatory Visit: Payer: Self-pay | Admitting: Internal Medicine

## 2011-04-19 ENCOUNTER — Other Ambulatory Visit: Payer: Self-pay

## 2011-04-19 ENCOUNTER — Other Ambulatory Visit: Payer: Self-pay | Admitting: Internal Medicine

## 2011-04-19 MED ORDER — FLECAINIDE ACETATE 50 MG PO TABS: 50.0000 mg | ORAL_TABLET | Freq: Every day | ORAL | Status: DC

## 2011-08-20 ENCOUNTER — Ambulatory Visit: Payer: 59 | Admitting: Internal Medicine

## 2011-09-25 ENCOUNTER — Encounter (HOSPITAL_COMMUNITY): Payer: Self-pay | Admitting: Pharmacist

## 2011-10-03 NOTE — H&P (Signed)
  Patient name  Cindy Faulkner, Cindy Faulkner DICTATION#  [id number] CSN# [id number]  [facility], MD 10/03/2011 11:01 AM

## 2011-10-04 ENCOUNTER — Encounter (HOSPITAL_COMMUNITY): Payer: Self-pay

## 2011-10-04 ENCOUNTER — Other Ambulatory Visit: Payer: Self-pay | Admitting: Internal Medicine

## 2011-10-04 ENCOUNTER — Encounter (HOSPITAL_COMMUNITY)
Admission: RE | Admit: 2011-10-04 | Discharge: 2011-10-04 | Disposition: A | Discharge status: Home / Self Care | Payer: 59 | Source: Ambulatory Visit | Attending: Obstetrics and Gynecology | Admitting: Obstetrics and Gynecology

## 2011-10-04 ENCOUNTER — Encounter: Payer: Self-pay | Admitting: *Deleted

## 2011-10-04 ENCOUNTER — Ambulatory Visit (INDEPENDENT_AMBULATORY_CARE_PROVIDER_SITE_OTHER): Payer: 59 | Admitting: Internal Medicine

## 2011-10-04 ENCOUNTER — Encounter: Payer: Self-pay | Admitting: Internal Medicine

## 2011-10-04 VITALS — BP 134/82 | HR 61 | Ht 67.0 in | Wt 250.8 lb

## 2011-10-04 DIAGNOSIS — Z8679 Personal history of other diseases of the circulatory system: Secondary | ICD-10-CM

## 2011-10-04 DIAGNOSIS — I10 Essential (primary) hypertension: Secondary | ICD-10-CM

## 2011-10-04 DIAGNOSIS — Z01818 Encounter for other preprocedural examination: Secondary | ICD-10-CM

## 2011-10-04 HISTORY — DX: Other complications of anesthesia, initial encounter: T88.59XA

## 2011-10-04 HISTORY — DX: Adverse effect of unspecified anesthetic, initial encounter: T41.45XA

## 2011-10-04 LAB — BASIC METABOLIC PANEL
BUN: 15 mg/dL (ref 6–23)
Creatinine, Ser: 0.62 mg/dL (ref 0.50–1.10)
GFR calc Af Amer: >90 mL/min (ref >90)
GFR calc non Af Amer: >90 mL/min (ref >90)
Glucose, Bld: 109 mg/dL — ABNORMAL HIGH (ref 70–99)
Potassium: 3.9 mEq/L (ref 3.5–5.1)

## 2011-10-04 LAB — CBC
HCT: 38.2 % (ref 36.0–46.0)
Hemoglobin: 12.7 g/dL (ref 12.0–15.0)
MCH: 29 pg (ref 26.0–34.0)
MCHC: 33.2 g/dL (ref 30.0–36.0)
MCV: 87.2 fL (ref 78.0–100.0)
RDW: 13.9 % (ref 11.5–15.5)

## 2011-10-04 NOTE — Progress Notes (Signed)
HPI Cindy Faulkner returns today for followup. She is a 47 year old woman with a history of hypertension and paroxysmal atrial fibrillation. She is continued to have problems with obesity. Her weight is down approximately 6 pounds since her visit a year ago. In the interim, she denies chest pain or shortness of breath. She does have intermittent palpitations. She denies peripheral edema. She is considering bariatric surgery. She is pending a gynecologic procedure. No Known Allergies   Current Outpatient Prescriptions  Medication Sig Dispense Refill  . ALPRAZolam (XANAX) 0.25 MG tablet Take 0.25 mg by mouth at bedtime as needed.        Marland Kitchen aspirin 325 MG tablet Take 325 mg by mouth daily.        . calcium carbonate (CALCIUM 600) 600 MG TABS Take 600 mg by mouth daily.        . Carnitine 250 MG CAPS Take 1 capsule by mouth daily.      . famotidine (PEPCID) 20 MG tablet Take 20 mg by mouth 2 (two) times daily as needed. For heartburn      . flecainide (TAMBOCOR) 50 MG tablet TAKE ONE TABLET BY MOUTH EVERY DAY  30 tablet  6  . furosemide (LASIX) 40 MG tablet Take 1 tablet (40 mg total) by mouth 2 (two) times daily.  180 tablet  1  . Glucosamine-Chondroitin (GLUCOSAMINE CHONDR COMPLEX PO) Take 1 capsule by mouth daily.       . metoprolol (LOPRESSOR) 50 MG tablet TAKE ONE TABLET BY MOUTH EVERY DAY  90 tablet  2  . Multiple Vitamin (MULTIVITAMIN) tablet Take 1 tablet by mouth daily.        . niacin (NIASPAN) 500 MG CR tablet Take 1,000 mg by mouth at bedtime.        . Omega-3 Fatty Acids (FISH OIL) 1000 MG CAPS Take 2 capsules by mouth daily.        . valACYclovir (VALTREX) 1000 MG tablet Take 1,000 mg by mouth 2 (two) times daily as needed. Take one tablet by mouth twice daily for three days for fever blisters      . DISCONTD: flecainide (TAMBOCOR) 50 MG tablet Take 1 tablet (50 mg total) by mouth daily.  30 tablet  4     Past Medical History  Diagnosis Date  . Atrial fibrillation     a. echo 4/07: EF  60%  . Hypertension   . Endometrial polyp   . Rectocele     WITHOUT MENTION OF UTERINE PROLAPSE  . Atrial tachycardia     a. s/p EPS 4/09: no inducible SVT - med Tx continued  . Complication of anesthesia     age 58yo, woke during procedure with GA, paralysed     ROS:   All systems reviewed and negative except as noted in the HPI.   Past Surgical History  Procedure Date  . Hysteroscopy   . Pylonidal cystect   . Leep      Family History  Problem Relation Age of Onset  . Hypertension Father   . Diabetes Father   . Stroke Father   . Hypertension Mother   . Stroke Mother      History   Social History  . Marital Status: Married    Spouse Name: N/A    Number of Children: N/A  . Years of Education: N/A   Occupational History  . Not on file.   Social History Main Topics  . Smoking status: Former Smoker -- 10 years  .  Smokeless tobacco: Never Used  . Alcohol Use: No  . Drug Use: No  . Sexually Active: Not on file   Other Topics Concern  . Not on file   Social History Narrative  . No narrative on file     BP 134/82  Pulse 61  Ht 5\' 7"  (1.702 m)  Wt 250 lb 12.8 oz (113.762 kg)  BMI 39.28 kg/m2  LMP 09/05/2011  Physical Exam:  Well appearing obese woman, NAD HEENT: Unremarkable Neck:  No JVD, no thyromegally Lungs:  Clear with no wheezes, rales, or rhonchi. HEART:  Regular rate rhythm, no murmurs, no rubs, no clicks Abd:  soft, positive bowel sounds, no organomegally, no rebound, no guarding Ext:  2 plus pulses, no edema, no cyanosis, no clubbing Skin:  No rashes no nodules Neuro:  CN II through XII intact, motor grossly intact  EKG Normal sinus rhythm normal axis and intervals.   Assess/Plan:

## 2011-10-04 NOTE — H&P (Signed)
NAME:  Cindy Faulkner, Cindy Faulkner                      ACCOUNT NO.:  MEDICAL RECORD NO.:  [id number]  LOCATION:                                 FACILITY:  PHYSICIAN:  Juluis Mire, M.D.   DATE OF BIRTH:  [birth year]  DATE OF ADMISSION: DATE OF DISCHARGE:                             HISTORY & PHYSICAL   Date of her surgery is October 08, 2011, at [facility].  HISTORY OF PRESENT ILLNESS:  The patient is a 46 year old, gravida 2, para 1 female, presents for hysteroscopic evaluation with planned resection of a polyp as well as dilation and curettage.  In relation to the present admission, the patient's cycles become very irregular every 21-33 days.  She would occasionally skip a period. Occasionally would have a heavy period with resultant cramping.  She has had a past history of a previous hysteroscopic resection of the polyp, underwent saline infusion ultrasound with finding of recurrent endometrial polyp and now presents for hysteroscopic resection.  ALLERGIES:  No known drug allergies.  MEDICATIONS:  Include Lasix 40 mg daily, flecainide 50 mg daily, Lopressor 50 mg daily, and aspirin supplementation.  PAST MEDICAL HISTORY:  She is being managed for hypertension on medication as noted.  Otherwise, usual childhood diseases.  Also, past history of atrial fibrillation under present management.  PAST SURGICAL HISTORY:  She had a pilonidal cyst removed in 1981.  She has had one vaginal delivery.  She had a previous laparoscopic bilateral tubal ligation with hysteroscopic resection of a polyp and posterior repair.  SOCIAL HISTORY:  Reveals no tobacco or alcohol use.  FAMILY HISTORY:  Father with a history of diabetes as well as hypertension.  Mother with a history of hypertension.  REVIEW OF SYSTEMS:  Noncontributory.  PHYSICAL EXAMINATION:  VITAL SIGNS:  The patient is afebrile with stable vital signs. HEENT:  The patient is normocephalic.  Pupils are equal, round, and reactive to  light and accommodation.  Extraocular movements are intact. Sclerae and conjunctivae are clear.  Oropharynx is clear. NECK:  Without thyromegaly. BREAST:  Not examined. LUNGS:  Clear. CARDIOVASCULAR:  Regular rhythm and rate without murmurs or gallops. ABDOMEN:  Benign.  No masses, organomegaly, or tenderness. PELVIC:  Normal external genitalia.  Vaginal mucosa is clear.  Cervix unremarkable.  Uterus normal size, shape, and contour.  Adnexa free of masses or tenderness. EXTREMITIES:  Trace edema. NEUROLOGIC:  Grossly normal limits.  IMPRESSION:  Endometrial polyp.  PLAN:  The patient will undergo cervical dilatation with hysteroscopic resection of the endometrial polyp.  We will also do endometrial curettings.  Ashby Dawes of the surgery and risk have been discussed including the risk of infection.  The risk of hemorrhage that could require transfusion with the risk of AIDS or hepatitis.  Excessive bleeding could require hysterectomy.  There is a risk of perforation leading to injury to adjacent organs, requiring further exploratory surgery.  Risk of deep venous thrombosis and pulmonary embolus.  The patient expressed to understand the potential risks and complications.     Juluis Mire, M.D.     JSM/MEDQ  D:  10/03/2011  T:  10/04/2011  Job:  [id number]

## 2011-10-04 NOTE — Patient Instructions (Addendum)
[address]  10/04/2011   Your procedure is scheduled on:  10/08/11  Enter through the Main Entrance of [facility] at 6 AM.  Pick up the phone at the desk and dial [phone].   Call this number if you have problems the morning of surgery: [phone]   Remember:   Do not eat food:After Midnight.  Do not drink clear liquids: After Midnight.  Take these medicines the morning of surgery with A SIP OF WATER: Blood Pressure Med., Cardiac med, and Pepcid   Do not wear jewelry, make-up or nail polish.  Do not wear lotions, powders, or perfumes. You may wear deodorant.  Do not shave 48 hours prior to surgery.  Do not bring valuables to the hospital.  Contacts, dentures or bridgework may not be worn into surgery.  Leave suitcase in the car. After surgery it may be brought to your room.  For patients admitted to the hospital, checkout time is 11:00 AM the day of discharge.   Patients discharged the day of surgery will not be allowed to drive home.  Name and phone number of your driver: husband   Dannielle Huh  Special Instructions: CHG wash as directed   Please read over the following fact sheets that you were given: Surgical Site Infection Prevention

## 2011-10-04 NOTE — Patient Instructions (Addendum)
Your physician wants you to follow-up in: 12 months with Dr. Taylor. You will receive a reminder letter in the mail two months in advance. If you don't receive a letter, please call our office to schedule the follow-up appointment.    

## 2011-10-04 NOTE — Assessment & Plan Note (Signed)
Her preoperative risk is quite low. I've encouraged her to proceed with surgery.

## 2011-10-04 NOTE — Assessment & Plan Note (Signed)
She is maintaining sinus rhythm very nicely on low-dose flecainide. No change in medical therapy.

## 2011-10-04 NOTE — Assessment & Plan Note (Signed)
Her blood pressure is fairly well controlled. She will maintain a low-sodium diet. She will continue her current medical therapy. I've encouraged her to continue to try and lose weight.

## 2011-10-04 NOTE — Pre-Procedure Instructions (Signed)
History and past cardiac testing results reviewed by Dr. Cristela Blue. No Anes.interview required. Patient is seeing Sharrell Ku today for routine EKG and office visit. Pt to ask him to send [place] copy of EKG and cardiac clearance.

## 2011-10-08 ENCOUNTER — Encounter (HOSPITAL_COMMUNITY): Payer: Self-pay | Admitting: *Deleted

## 2011-10-08 ENCOUNTER — Encounter (HOSPITAL_COMMUNITY)
Admission: RE | Disposition: A | Discharge status: Home / Self Care | Payer: Self-pay | Source: Ambulatory Visit | Attending: Obstetrics and Gynecology

## 2011-10-08 ENCOUNTER — Encounter (HOSPITAL_COMMUNITY): Payer: Self-pay | Admitting: Anesthesiology

## 2011-10-08 ENCOUNTER — Ambulatory Visit (HOSPITAL_COMMUNITY): Payer: 59 | Admitting: Anesthesiology

## 2011-10-08 ENCOUNTER — Ambulatory Visit (HOSPITAL_COMMUNITY)
Admission: RE | Admit: 2011-10-08 | Discharge: 2011-10-08 | Disposition: A | Discharge status: Home / Self Care | Payer: 59 | Source: Ambulatory Visit | Attending: Obstetrics and Gynecology | Admitting: Obstetrics and Gynecology

## 2011-10-08 DIAGNOSIS — Z01812 Encounter for preprocedural laboratory examination: Secondary | ICD-10-CM | POA: Insufficient documentation

## 2011-10-08 DIAGNOSIS — N840 Polyp of corpus uteri: Secondary | ICD-10-CM

## 2011-10-08 DIAGNOSIS — Z01818 Encounter for other preprocedural examination: Secondary | ICD-10-CM | POA: Insufficient documentation

## 2011-10-08 HISTORY — DX: Other specified postprocedural states: Z98.890

## 2011-10-08 HISTORY — DX: Other specified postprocedural states: R11.2

## 2011-10-08 LAB — HCG, SERUM, QUALITATIVE: Preg, Serum: NEGATIVE

## 2011-10-08 SURGERY — DILATATION & CURETTAGE/HYSTEROSCOPY WITH RESECTOCOPE
Anesthesia: General | Site: Uterus | Wound class: Clean Contaminated

## 2011-10-08 MED ORDER — SCOPOLAMINE 1 MG/3DAYS TD PT72
1.0000 | MEDICATED_PATCH | Freq: Once | TRANSDERMAL | Status: AC
  Administered 2011-10-08: 1.5 mg via TRANSDERMAL
  Administered 2011-10-08: 1 via TRANSDERMAL

## 2011-10-08 MED ORDER — LIDOCAINE-EPINEPHRINE (PF) 1 %-1:200000 IJ SOLN
INTRAMUSCULAR | Status: AC
  Filled 2011-10-08: qty 10

## 2011-10-08 MED ORDER — FENTANYL CITRATE 0.05 MG/ML IJ SOLN
INTRAMUSCULAR | Status: AC
  Filled 2011-10-08: qty 2

## 2011-10-08 MED ORDER — PROPOFOL 10 MG/ML IV EMUL
INTRAVENOUS | Status: AC
  Filled 2011-10-08: qty 20

## 2011-10-08 MED ORDER — PROMETHAZINE HCL 25 MG/ML IJ SOLN
6.2500 mg | INTRAMUSCULAR | Status: DC | PRN
  Administered 2011-10-08: 6.25 mg via INTRAVENOUS

## 2011-10-08 MED ORDER — LIDOCAINE HCL (CARDIAC) 20 MG/ML IV SOLN
INTRAVENOUS | Status: AC
  Filled 2011-10-08: qty 5

## 2011-10-08 MED ORDER — PANTOPRAZOLE SODIUM 40 MG PO TBEC
DELAYED_RELEASE_TABLET | ORAL | Status: AC
  Administered 2011-10-08: 40 mg via ORAL
  Filled 2011-10-08: qty 1

## 2011-10-08 MED ORDER — LACTATED RINGERS IV SOLN
INTRAVENOUS | Status: DC
  Administered 2011-10-08: 08:00:00 via INTRAVENOUS
  Administered 2011-10-08: 125 mL/h via INTRAVENOUS

## 2011-10-08 MED ORDER — DEXAMETHASONE SODIUM PHOSPHATE 4 MG/ML IJ SOLN
INTRAMUSCULAR | Status: DC | PRN
  Administered 2011-10-08: 4 mg via INTRAVENOUS

## 2011-10-08 MED ORDER — MIDAZOLAM HCL 2 MG/2ML IJ SOLN
INTRAMUSCULAR | Status: AC
  Filled 2011-10-08: qty 2

## 2011-10-08 MED ORDER — SCOPOLAMINE 1 MG/3DAYS TD PT72
MEDICATED_PATCH | TRANSDERMAL | Status: AC
  Administered 2011-10-08: 1.5 mg via TRANSDERMAL
  Filled 2011-10-08: qty 1

## 2011-10-08 MED ORDER — PHENYLEPHRINE HCL 10 MG/ML IJ SOLN
INTRAMUSCULAR | Status: DC | PRN
  Administered 2011-10-08 (×2): 80 ug via INTRAVENOUS

## 2011-10-08 MED ORDER — GLYCINE 1.5 % IR SOLN
Status: DC | PRN
  Administered 2011-10-08 (×2): 3000 mL

## 2011-10-08 MED ORDER — LIDOCAINE-EPINEPHRINE (PF) 1 %-1:200000 IJ SOLN
INTRAMUSCULAR | Status: DC | PRN
  Administered 2011-10-08: 30 mL

## 2011-10-08 MED ORDER — MIDAZOLAM HCL 5 MG/5ML IJ SOLN
INTRAMUSCULAR | Status: DC | PRN
  Administered 2011-10-08: 2 mg via INTRAVENOUS

## 2011-10-08 MED ORDER — DEXAMETHASONE SODIUM PHOSPHATE 10 MG/ML IJ SOLN
INTRAMUSCULAR | Status: AC
  Filled 2011-10-08: qty 1

## 2011-10-08 MED ORDER — GLYCOPYRROLATE 0.2 MG/ML IJ SOLN
INTRAMUSCULAR | Status: AC
  Filled 2011-10-08: qty 1

## 2011-10-08 MED ORDER — KETOROLAC TROMETHAMINE 30 MG/ML IJ SOLN: 15.0000 mg | Freq: Once | INTRAMUSCULAR | Status: DC | PRN

## 2011-10-08 MED ORDER — PROMETHAZINE HCL 25 MG/ML IJ SOLN
INTRAMUSCULAR | Status: AC
  Administered 2011-10-08: 6.25 mg via INTRAVENOUS
  Filled 2011-10-08: qty 1

## 2011-10-08 MED ORDER — PANTOPRAZOLE SODIUM 40 MG PO TBEC
40.0000 mg | DELAYED_RELEASE_TABLET | Freq: Once | ORAL | Status: AC
  Administered 2011-10-08: 40 mg via ORAL

## 2011-10-08 MED ORDER — LIDOCAINE HCL (CARDIAC) 20 MG/ML IV SOLN
INTRAVENOUS | Status: DC | PRN
  Administered 2011-10-08: 100 mg via INTRAVENOUS

## 2011-10-08 MED ORDER — ONDANSETRON HCL 4 MG/2ML IJ SOLN
INTRAMUSCULAR | Status: AC
  Filled 2011-10-08: qty 2

## 2011-10-08 MED ORDER — MEPERIDINE HCL 25 MG/ML IJ SOLN: 6.2500 mg | INTRAMUSCULAR | Status: DC | PRN

## 2011-10-08 MED ORDER — KETOROLAC TROMETHAMINE 30 MG/ML IJ SOLN
INTRAMUSCULAR | Status: AC
  Filled 2011-10-08: qty 1

## 2011-10-08 MED ORDER — ONDANSETRON HCL 4 MG/2ML IJ SOLN
INTRAMUSCULAR | Status: DC | PRN
  Administered 2011-10-08: 4 mg via INTRAVENOUS

## 2011-10-08 MED ORDER — PROPOFOL 10 MG/ML IV EMUL
INTRAVENOUS | Status: DC | PRN
  Administered 2011-10-08: 200 mg via INTRAVENOUS

## 2011-10-08 MED ORDER — GLYCOPYRROLATE 0.2 MG/ML IJ SOLN
INTRAMUSCULAR | Status: DC | PRN
  Administered 2011-10-08: 0.2 mg via INTRAVENOUS
  Administered 2011-10-08: 0.1 mg via INTRAVENOUS

## 2011-10-08 MED ORDER — KETOROLAC TROMETHAMINE 30 MG/ML IJ SOLN
INTRAMUSCULAR | Status: DC | PRN
  Administered 2011-10-08: 30 mg via INTRAVENOUS

## 2011-10-08 MED ORDER — FENTANYL CITRATE 0.05 MG/ML IJ SOLN: 25.0000 ug | INTRAMUSCULAR | Status: DC | PRN

## 2011-10-08 MED ORDER — FENTANYL CITRATE 0.05 MG/ML IJ SOLN
INTRAMUSCULAR | Status: DC | PRN
  Administered 2011-10-08: 50 ug via INTRAVENOUS
  Administered 2011-10-08: 25 ug via INTRAVENOUS
  Administered 2011-10-08: 50 ug via INTRAVENOUS

## 2011-10-08 MED ORDER — MIDAZOLAM HCL 2 MG/2ML IJ SOLN: 0.5000 mg | Freq: Once | INTRAMUSCULAR | Status: DC | PRN

## 2011-10-08 SURGICAL SUPPLY — 15 items
CANISTER SUCTION 2500CC (MISCELLANEOUS) ×2 IMPLANT
CATH ROBINSON RED A/P 16FR (CATHETERS) ×2 IMPLANT
CLOTH BEACON ORANGE TIMEOUT ST (SAFETY) ×2 IMPLANT
CONTAINER PREFILL 10% NBF 60ML (FORM) ×4 IMPLANT
DILATOR CANAL MILEX (MISCELLANEOUS) ×2 IMPLANT
ELECT REM PT RETURN 9FT ADLT (ELECTROSURGICAL) ×2
ELECTRODE REM PT RTRN 9FT ADLT (ELECTROSURGICAL) ×1 IMPLANT
GLOVE BIO SURGEON STRL SZ7 (GLOVE) ×4 IMPLANT
GOWN PREVENTION PLUS LG XLONG (DISPOSABLE) ×2 IMPLANT
GOWN STRL REIN XL XLG (GOWN DISPOSABLE) ×2 IMPLANT
LOOP ANGLED CUTTING 22FR (CUTTING LOOP) ×2 IMPLANT
NEEDLE SPNL 20GX3.5 QUINCKE YW (NEEDLE) ×2 IMPLANT
PACK HYSTEROSCOPY LF (CUSTOM PROCEDURE TRAY) ×2 IMPLANT
TOWEL OR 17X24 6PK STRL BLUE (TOWEL DISPOSABLE) ×4 IMPLANT
WATER STERILE IRR 1000ML POUR (IV SOLUTION) ×2 IMPLANT

## 2011-10-08 NOTE — H&P (Signed)
  History and physical exam unchanged 

## 2011-10-08 NOTE — Op Note (Signed)
Patient name   Cindy Faulkner, Cindy Faulkner DICTATION#  [id number] CSN# [id number]  [facility], MD 10/08/2011 7:59 AM

## 2011-10-08 NOTE — Anesthesia Postprocedure Evaluation (Signed)
  Anesthesia Post-op Note  Patient: Cindy Faulkner  Procedure(s) Performed: Procedure(s) (LRB): DILATATION & CURETTAGE/HYSTEROSCOPY WITH RESECTOCOPE (N/A)  Patient is awake and responsive. Pain and nausea are reasonably well controlled. Vital signs are stable and clinically acceptable. Oxygen saturation is clinically acceptable. There are no apparent anesthetic complications at this time. Patient is ready for discharge.

## 2011-10-08 NOTE — Transfer of Care (Signed)
Immediate Anesthesia Transfer of Care Note  Patient: Cindy Faulkner  Procedure(s) Performed: Procedure(s) (LRB): DILATATION & CURETTAGE/HYSTEROSCOPY WITH RESECTOCOPE (N/A)  Patient Location: PACU  Anesthesia Type: General  Level of Consciousness: awake, alert  and oriented  Airway & Oxygen Therapy: Patient Spontanous Breathing and Patient connected to nasal cannula oxygen  Post-op Assessment: Report given to PACU RN and Post -op Vital signs reviewed and stable  Post vital signs: Reviewed and stable  Complications: No apparent anesthesia complications

## 2011-10-08 NOTE — Brief Op Note (Signed)
10/08/2011  7:57 AM  PATIENT:  Laurell Roof  46 y.o. female  PRE-OPERATIVE DIAGNOSIS:  POLYP  POST-OPERATIVE DIAGNOSIS:  polyp  PROCEDURE:  Procedure(s) (LRB): DILATATION & CURETTAGE/HYSTEROSCOPY WITH RESECTOCOPE (N/A)  SURGEON:  Surgeon(s) and Role:    * Juluis Mire, MD - Primary  PHYSICIAN ASSISTANT:   ASSISTANTS: none   ANESTHESIA:   general and paracervical block  EBL:  Total I/O In: 1000 [I.V.:1000] Out: 50 [Urine:50]  BLOOD ADMINISTERED:none  DRAINS: none   LOCAL MEDICATIONS USED:  XYLOCAINE  and Amount: 20  ml  SPECIMEN:  Source of Specimen:  curretting and endometrial recections  DISPOSITION OF SPECIMEN:  PATHOLOGY  COUNTS:  YES  TOURNIQUET:  * No tourniquets in log *  DICTATION: .Other Dictation: Dictation Number [id number]  PLAN OF CARE: Discharge to home after PACU  PATIENT DISPOSITION:  PACU - hemodynamically stable.   Delay start of Pharmacological VTE agent (>24hrs) due to surgical blood loss or risk of bleeding: no

## 2011-10-08 NOTE — Anesthesia Preprocedure Evaluation (Signed)
Anesthesia Evaluation  Patient identified by MRN, date of birth, ID band Patient awake    Reviewed: Allergy & Precautions, H&P , Patient's Chart, lab work & pertinent test results, reviewed documented beta blocker date and time   History of Anesthesia Complications (+) PONV  Airway Mallampati: III TM Distance: >3 FB Neck ROM: full    Dental No notable dental hx.    Pulmonary neg pulmonary ROS, shortness of breath,  breath sounds clear to auscultation  Pulmonary exam normal       Cardiovascular Exercise Tolerance: Good hypertension, negative cardio ROS  + dysrhythmias Rhythm:regular Rate:Normal     Neuro/Psych negative neurological ROS  negative psych ROS   GI/Hepatic negative GI ROS, Neg liver ROS,   Endo/Other  negative endocrine ROSMorbid obesity  Renal/GU negative Renal ROS     Musculoskeletal   Abdominal   Peds  Hematology negative hematology ROS (+)   Anesthesia Other Findings PONV (postoperative nausea and vomiting)     Atrial fibrillation   a. echo 4/07: EF 60%    Hypertension     Endometrial polyp        Rectocele   WITHOUT MENTION OF UTERINE PROLAPSE Atrial tachycardia   a. s/p EPS 4/09: no inducible SVT - med Tx continued    Complication of anesthesia   age 46yo, woke during procedure with GA, paralysed  SVD (spontaneous vaginal delivery)      Reproductive/Obstetrics negative OB ROS                           Anesthesia Physical Anesthesia Plan  ASA: III  Anesthesia Plan: General LMA   Post-op Pain Management:    Induction:   Airway Management Planned:   Additional Equipment:   Intra-op Plan:   Post-operative Plan:   Informed Consent: I have reviewed the patients History and Physical, chart, labs and discussed the procedure including the risks, benefits and alternatives for the proposed anesthesia with the patient or authorized representative who has indicated  his/her understanding and acceptance.   Dental Advisory Given  Plan Discussed with: CRNA, Surgeon and Anesthesiologist  Anesthesia Plan Comments:         Anesthesia Quick Evaluation

## 2011-10-09 NOTE — Op Note (Signed)
Cindy Faulkner, Cindy Faulkner                 ACCOUNT NO.:  [id number]  MEDICAL RECORD NO.:  [id number]  LOCATION:  [facility]                          FACILITY:  [facility]  PHYSICIAN:  Juluis Mire, M.D.   DATE OF BIRTH:  1965/05/03  DATE OF PROCEDURE: DATE OF DISCHARGE:                              OPERATIVE REPORT   PREOPERATIVE DIAGNOSIS:  Endometrial polyp.  POSTOPERATIVE DIAGNOSIS:  Endometrial polyp.  PROCEDURE:  Paracervical block.  Cervical dilatation and curettage. Hysteroscopy, resection of endometrium.  SURGEON:  Juluis Mire, MD  ANESTHESIA:  Paracervical block and general anesthesia.  BLOOD LOSS:  Minimal.  PACKS AND DRAINS:  None.  INTRAOPERATIVE BLOOD PLACED:  None.  COMPLICATIONS:  None.  INDICATION:  Dictated in history and physical.  PROCEDURE:  The patient was taken to OR and placed in supine position. After satisfactory level of general anesthesia was obtained, the patient was placed in dorsal lithotomy position using Allen stirrups.  Perineum and vagina prepped out with Betadine and draped in sterile field.  A speculum was placed in the vaginal vault.  Cervix grasped with single- tooth tenaculum.  Uterus sounded to 9 cm.  Cervix serially dilated to a size 31 Pratt dilator.  Operative hysteroscope was introduced and intrauterine cavity was distended using glycine.  She was on __________. She had markedly shaggy endometrium. I could not delineate a distinct polyp; it may have been disrupted from the dilation.  We did multiple resections along the uterine wall.  These were all sent for pathological review.  At the end of procedure, we had no active bleeding.  No signs of perforation.  We then removed the hysteroscope, had endometrial curettings that were also sent to pathology.  At this point in time, speculum and single-tooth tenaculum removed.  The patient was taken out of the dorsal lithotomy position. Once alert, transferred to the recovery room in good condition.   Sponge, instrument, and needle count was correct by circulating nurse.     Juluis Mire, M.D.     JSM/MEDQ  D:  10/08/2011  T:  10/08/2011  Job:  [id number]

## 2011-11-16 ENCOUNTER — Other Ambulatory Visit: Payer: Self-pay | Admitting: Internal Medicine

## 2012-02-18 ENCOUNTER — Emergency Department (INDEPENDENT_AMBULATORY_CARE_PROVIDER_SITE_OTHER): Payer: 59

## 2012-02-18 ENCOUNTER — Emergency Department (HOSPITAL_COMMUNITY)
Admission: EM | Admit: 2012-02-18 | Discharge: 2012-02-18 | Disposition: A | Discharge status: Home / Self Care | Payer: 59 | Source: Home / Self Care

## 2012-02-18 ENCOUNTER — Encounter (HOSPITAL_COMMUNITY): Payer: Self-pay | Admitting: *Deleted

## 2012-02-18 DIAGNOSIS — J40 Bronchitis, not specified as acute or chronic: Secondary | ICD-10-CM

## 2012-02-18 MED ORDER — PREDNISONE 50 MG PO TABS: 50.0000 mg | ORAL_TABLET | Freq: Every day | ORAL | Status: DC

## 2012-02-18 MED ORDER — GUAIFENESIN-CODEINE 100-10 MG/5ML PO SYRP: 5.0000 mL | ORAL_SOLUTION | Freq: Three times a day (TID) | ORAL | Status: DC | PRN

## 2012-02-18 MED ORDER — ALBUTEROL SULFATE HFA 108 (90 BASE) MCG/ACT IN AERS: 2.0000 | INHALATION_SPRAY | Freq: Four times a day (QID) | RESPIRATORY_TRACT | Status: DC | PRN

## 2012-02-18 NOTE — ED Notes (Signed)
Started not feeling well this past Sunday; on Monday started with dry cough.  On Tues started with body aches, low-grade fevers, wheezing.  As of yesterday has had deep cough with green sputum.  Expiratory wheezing noted in left.

## 2012-02-18 NOTE — ED Provider Notes (Signed)
Cindy Faulkner is a 46 y.o. female who presents to Urgent Care today for cough, congestion, shortness of breath, and wheezing. Her symptoms started Sunday and have worsened.  She notes that the cough is productive.  She notes fevers and chills and muscle soreness earlier in the week. She's tried over-the-counter cough and cold medicine which have not helped much. Additionally she tried using some of her father-in-law's albuterol which has helped significantly. She has been unable to work for the last several days. 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      Patient did not take her losartan today  PMH reviewed. Atrial fibrillation, currently in normal sinus rhythm and hypertension no history of COPD or asthma History  Substance Use Topics  . Smoking status: Former Smoker -- 0.5 packs/day for 10 years    Types: Cigarettes    Quit date: 02/22/1998  . Smokeless tobacco: Never Used  . Alcohol Use: No   ROS as above Medications reviewed. No current facility-administered medications for this encounter.   Current Outpatient Prescriptions  Medication Sig Dispense Refill  . aspirin 325 MG tablet Take 325 mg by mouth daily.        . flecainide (TAMBOCOR) 50 MG tablet TAKE ONE TABLET BY MOUTH EVERY DAY  30 tablet  6  . furosemide (LASIX) 40 MG tablet Take 1 tablet (40 mg total) by mouth 2 (two) times daily.  180 tablet  1  . Glucosamine-Chondroitin (GLUCOSAMINE CHONDR COMPLEX PO) Take 1 capsule by mouth daily.       . metoprolol (LOPRESSOR) 50 MG tablet TAKE ONE TABLET BY MOUTH EVERY DAY  90 tablet  3  . Multiple Vitamin (MULTIVITAMIN) tablet Take 1 tablet by mouth daily.        Marland Kitchen albuterol (PROVENTIL HFA;VENTOLIN HFA) 108 (90 BASE) MCG/ACT inhaler Inhale 2 puffs into the lungs every 6 (six) hours as needed for wheezing.  1 Inhaler  2  . ALPRAZolam (XANAX) 0.25 MG tablet Take 0.25 mg by mouth at bedtime as needed.        . calcium carbonate (CALCIUM 600)  600 MG TABS Take 600 mg by mouth daily.        . Carnitine 250 MG CAPS Take 1 capsule by mouth daily.      . famotidine (PEPCID) 20 MG tablet Take 20 mg by mouth 2 (two) times daily as needed. For heartburn      . guaiFENesin-codeine (ROBITUSSIN AC) 100-10 MG/5ML syrup Take 5 mLs by mouth 3 (three) times daily as needed for cough.  120 mL  0  . niacin (NIASPAN) 500 MG CR tablet Take 1,000 mg by mouth at bedtime.        . Omega-3 Fatty Acids (FISH OIL) 1000 MG CAPS Take 2 capsules by mouth daily.        . predniSONE (DELTASONE) 50 MG tablet Take 1 tablet (50 mg total) by mouth daily.  5 tablet  0  . valACYclovir (VALTREX) 1000 MG tablet Take 1,000 mg by mouth 2 (two) times daily as needed. Take one tablet by mouth twice daily for three days for fever blisters        Exam:  BP 169/110  Pulse 76  Temp 98.2 F (36.8 C) (Oral)  Resp 20  SpO2 100%  LMP 01/31/2012 Gen: Well NAD HEENT: EOMI,  MMM Lungs: NL WOB. Ronchi BL. Wheeze BL. No rales.  Heart: RRR no MRG  Abd: NABS, NT, ND Exts: Non edematous BL  LE, warm and well perfused.   No results found for this or any previous visit (from the past 24 hour(s)). Dg Chest 2 View  02/18/2012  *RADIOLOGY REPORT*  Clinical Data: Cough and fever.  CHEST - 2 VIEW  Comparison: Chest x-ray 11/27/2010.  Findings: Lung volumes are normal.  No consolidative airspace disease.  No pleural effusions.  No pneumothorax.  No pulmonary nodule or mass noted.  Pulmonary vasculature and the cardiomediastinal silhouette are within normal limits.  IMPRESSION: 1. No radiographic evidence of acute cardiopulmonary disease.   Original Report Authenticated By: Trudie Reed, M.D.     Assessment and Plan: 46 y.o. female with viral bronchitis.  X-ray and vital signs are within normal limits with the exception of elevated blood pressure secondary to not taking her blood pressure medication today.  As she is wheezing and has benefited from albuterol I feel is reasonable to  treat like COPD exacerbation with prednisone, and albuterol. Additionally we'll treat symptomatic cough with codeine containing cough medication.   Discussed warning signs or symptoms. Please see discharge instructions. Patient expresses understanding.  Recommend patient resume her blood pressure medications.  Additionally recommended she followup with her primary care Dr. in the next several days especially if worsening.      Rodolph Bong, MD 02/18/12 [phone]

## 2012-02-18 NOTE — ED Provider Notes (Signed)
Medical screening examination/treatment/procedure(s) were performed by a resident physician and as supervising physician I was immediately available for consultation/collaboration.  Larena Ohnemus, M.D.   Mariaclara Spear C Mykiah Schmuck, MD 02/18/12 2146 

## 2012-02-22 ENCOUNTER — Other Ambulatory Visit: Payer: Self-pay | Admitting: Internal Medicine

## 2012-03-02 ENCOUNTER — Emergency Department (HOSPITAL_COMMUNITY)
Admission: EM | Admit: 2012-03-02 | Discharge: 2012-03-02 | Disposition: A | Discharge status: Home / Self Care | Payer: 59 | Source: Home / Self Care

## 2012-03-02 ENCOUNTER — Encounter (HOSPITAL_COMMUNITY): Payer: Self-pay | Admitting: *Deleted

## 2012-03-02 DIAGNOSIS — J069 Acute upper respiratory infection, unspecified: Secondary | ICD-10-CM

## 2012-03-02 LAB — POCT RAPID STREP A: Streptococcus, Group A Screen (Direct): NEGATIVE

## 2012-03-02 MED ORDER — ONDANSETRON HCL 4 MG PO TABS: 4.0000 mg | ORAL_TABLET | Freq: Four times a day (QID) | ORAL | Status: DC

## 2012-03-02 NOTE — ED Provider Notes (Signed)
History     CSN: [id number]  Arrival date & time 03/02/12  1027   None     Chief Complaint  Patient presents with  . 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 She is complaining of bodyaches, malaise nasal congestion, sore throat and cough. She has had intermittent symptoms since the week of Christmas. She denies fever. She has had waves of nausea but not currently. When she was in the urgent care during the week of Christmas she was administered prednisone and albuterol she states her breathing is much improved in the symptoms have essentially abated. She went to work this morning thinking that she would feel better after being up for a while but she continued to have malaise, cough, PND and bodyaches. Still no documented fever.   Past Medical History  Diagnosis Date  . Atrial fibrillation     a. echo 4/07: EF 60%  . Hypertension   . Endometrial polyp   . Rectocele     WITHOUT MENTION OF UTERINE PROLAPSE  . Atrial tachycardia     a. s/p EPS 4/09: no inducible SVT - med Tx continued  . Complication of anesthesia     age 60yo, woke during procedure with GA, paralysed   . PONV (postoperative nausea and vomiting)   . SVD (spontaneous vaginal delivery)     x 1    Past Surgical History  Procedure Date  . Hysteroscopy   . Pylonidal cystect   . Leep   . Wisdom tooth extraction   . Colonoscopy   . Tubal ligation     Family History  Problem Relation Age of Onset  . Hypertension Father   . Diabetes Father   . Stroke Father   . Hypertension Mother   . Stroke Mother     History  Substance Use Topics  . Smoking status: Former Smoker -- 0.5 packs/day for 10 years    Types: Cigarettes    Quit date: 02/22/1998  . Smokeless tobacco: Never Used  . Alcohol Use: No     OB History    Grav Para Term Preterm Abortions TAB SAB Ect Mult Living                  Review of Systems  Constitutional: Positive for activity change and fatigue. Negative for fever, chills and appetite change.  HENT: Positive for congestion, sore throat, rhinorrhea and postnasal drip. Negative for ear pain, facial swelling, neck pain and neck stiffness.   Eyes: Negative.   Respiratory: Negative.   Cardiovascular: Negative.   Gastrointestinal: Positive for nausea. Negative for vomiting.  Genitourinary: Negative.   Skin: Negative for pallor and rash.  Neurological: Negative.     Allergies  Review of patient's allergies indicates no known allergies.  Home Medications   Current Outpatient Rx  Name  Route  Sig  Dispense  Refill  . ALBUTEROL SULFATE HFA 108 (90 BASE) MCG/ACT IN AERS   Inhalation   Inhale 2 puffs into the lungs every 6 (six) hours as needed for wheezing.   1 Inhaler   2   . ALPRAZOLAM 0.25 MG PO TABS   Oral   Take 0.25 mg by mouth at bedtime as needed.           . ASPIRIN 325 MG PO TABS   Oral   Take 325 mg by  mouth daily.           Marland Kitchen CALCIUM CARBONATE 600 MG PO TABS   Oral   Take 600 mg by mouth daily.           Marland Kitchen CARNITINE 250 MG PO CAPS   Oral   Take 1 capsule by mouth daily.         Marland Kitchen FAMOTIDINE 20 MG PO TABS   Oral   Take 20 mg by mouth 2 (two) times daily as needed. For heartburn         . FLECAINIDE ACETATE 50 MG PO TABS      TAKE ONE TABLET BY MOUTH EVERY DAY   30 tablet   6   . FUROSEMIDE 40 MG PO TABS      TAKE ONE TABLET BY MOUTH TWICE DAILY   180 tablet   0   . GLUCOSAMINE CHONDR COMPLEX PO   Oral   Take 1 capsule by mouth daily.          . GUAIFENESIN-CODEINE 100-10 MG/5ML PO SYRP   Oral   Take 5 mLs by mouth 3 (three) times daily as needed for cough.   120 mL   0   . METOPROLOL TARTRATE 50 MG PO TABS      TAKE ONE TABLET BY MOUTH EVERY DAY   90 tablet   3   . ONE-DAILY MULTI VITAMINS PO TABS    Oral   Take 1 tablet by mouth daily.           Marland Kitchen NIACIN ER (ANTIHYPERLIPIDEMIC) 500 MG PO TBCR   Oral   Take 1,000 mg by mouth at bedtime.           Marland Kitchen FISH OIL 1000 MG PO CAPS   Oral   Take 2 capsules by mouth daily.           Marland Kitchen ONDANSETRON HCL 4 MG PO TABS   Oral   Take 1 tablet (4 mg total) by mouth every 6 (six) hours.   12 tablet   0   . PREDNISONE 50 MG PO TABS   Oral   Take 1 tablet (50 mg total) by mouth daily.   5 tablet   0   . VALACYCLOVIR HCL 1 G PO TABS   Oral   Take 1,000 mg by mouth 2 (two) times daily as needed. Take one tablet by mouth twice daily for three days for fever blisters           BP 143/73  Pulse 57  Temp 98.6 F (37 C) (Oral)  Resp 16  SpO2 100%  LMP 02/12/2012  Physical Exam  Constitutional: She is oriented to person, place, and time. She appears well-developed and well-nourished. No distress.  HENT:       Bilateral TMs are normal Oropharynx with minor erythema and clear PND but no exudates.  Neck: Normal range of motion. Neck supple.  Cardiovascular: Normal rate and regular rhythm.   Pulmonary/Chest: Effort normal and breath sounds normal. No respiratory distress. She has no wheezes. She has no rales.  Musculoskeletal: Normal range of motion. She exhibits no edema.  Lymphadenopathy:    She has no cervical adenopathy.  Neurological: She is alert and oriented to person, place, and time. She exhibits normal muscle tone.  Skin: Skin is warm and dry. No rash noted.  Psychiatric: She has a normal mood and affect.    ED Course  Procedures (including critical care time)   Labs  Reviewed  POCT RAPID STREP A ([facility] ONLY)   No results found.   1. URI (upper respiratory infection)       MDM  Instructions for URI Zofran 4 mg by mouth every 4-6 hours as needed for nausea. Home and rest. May take OTC medications for symptom relief. She is having no fever and primary symptoms are URI type symptoms with drainage and  congestion and malaise. 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 It is doubtful that she has the flu but certainly not impossible. Since she has had the symptoms for several days so likely Tamiflu would be of any benefit and she is not wanting to take it anyway. For any new symptoms problems or worsening, trouble breathing fevers increased malaise or vomiting and unable to hold down liquids may return. Results for orders placed during the hospital encounter of 03/02/12  POCT RAPID STREP A ([facility] ONLY)      Component Value Range   Streptococcus, Group A Screen (Direct) NEGATIVE  NEGATIVE           Hayden Rasmussen, NP 03/02/12 1127

## 2012-03-02 NOTE — ED Notes (Signed)
Pt  Seen  sev  Weeks  Ago ucc forURI     rx  Prednisone and  Albuterol  -  Continues  To  Have lingering  Symptoms     Now  Has   Body a aches  And  sorethroat

## 2012-03-02 NOTE — ED Provider Notes (Signed)
Medical screening examination/treatment/procedure(s) were performed by non-physician practitioner and as supervising physician I was immediately available for consultation/collaboration.  Leslee Home, M.D.   Reuben Likes, MD 03/02/12 [phone]

## 2012-06-24 ENCOUNTER — Other Ambulatory Visit: Payer: Self-pay | Admitting: Internal Medicine

## 2012-07-25 ENCOUNTER — Other Ambulatory Visit: Payer: Self-pay | Admitting: Internal Medicine

## 2012-07-31 ENCOUNTER — Other Ambulatory Visit: Payer: Self-pay

## 2012-07-31 MED ORDER — FLECAINIDE ACETATE 50 MG PO TABS: ORAL_TABLET | ORAL | Status: DC

## 2012-07-31 NOTE — Telephone Encounter (Signed)
..   Requested Prescriptions   Signed Prescriptions Disp Refills  . flecainide (TAMBOCOR) 50 MG tablet 30 tablet 3    Sig: TAKE ONE TABLET BY MOUTH ONCE DAILY    Authorizing Provider: Marinus Maw    Ordering User: Christella Hartigan, Hessie Varone Judie Petit

## 2012-10-04 ENCOUNTER — Encounter: Payer: Self-pay | Admitting: Internal Medicine

## 2012-10-04 ENCOUNTER — Ambulatory Visit (INDEPENDENT_AMBULATORY_CARE_PROVIDER_SITE_OTHER): Payer: 59 | Admitting: Internal Medicine

## 2012-10-04 VITALS — BP 152/96 | HR 59 | Ht 67.0 in | Wt 257.4 lb

## 2012-10-04 DIAGNOSIS — I10 Essential (primary) hypertension: Secondary | ICD-10-CM

## 2012-10-04 DIAGNOSIS — Z8679 Personal history of other diseases of the circulatory system: Secondary | ICD-10-CM

## 2012-10-04 DIAGNOSIS — R079 Chest pain, unspecified: Secondary | ICD-10-CM

## 2012-10-04 MED ORDER — METOPROLOL TARTRATE 50 MG PO TABS: 50.0000 mg | ORAL_TABLET | Freq: Every day | ORAL | Status: DC

## 2012-10-04 MED ORDER — FLECAINIDE ACETATE 50 MG PO TABS: ORAL_TABLET | ORAL | Status: DC

## 2012-10-04 MED ORDER — FUROSEMIDE 40 MG PO TABS: 40.0000 mg | ORAL_TABLET | Freq: Two times a day (BID) | ORAL | Status: DC

## 2012-10-04 NOTE — Assessment & Plan Note (Signed)
She is maintaining sinus rhythm very nicely.no change in medical therapy.

## 2012-10-04 NOTE — Assessment & Plan Note (Signed)
Her blood pressure is elevated today. She admits to dietary indiscretion. I've asked her to reduce her salt intake, and start exercising.

## 2012-10-04 NOTE — Progress Notes (Signed)
HPI Cindy Faulkner returns today for followup. She is a very pleasant 47 year old woman with a history of hypertension, obesity, paroxysmal atrial fibrillation and atrial tachycardia, and dyslipidemia. In the interim she has been stable. When I saw her last, she was considering having bariatric surgery. She has decided not to undergo this procedure. The patient is not to lose weight. She notes that her father recently passed away, increasing her stress. The patient's atrial arrhythmias have been well-controlled with very low-dose flecainide. No Known Allergies   Current Outpatient Prescriptions  Medication Sig Dispense Refill  . ALPRAZolam (XANAX) 0.25 MG tablet Take 0.25 mg by mouth at bedtime as needed.        Marland Kitchen aspirin 325 MG tablet Take 325 mg by mouth daily.        . famotidine (PEPCID) 20 MG tablet Take 20 mg by mouth 2 (two) times daily as needed. For heartburn      . flecainide (TAMBOCOR) 50 MG tablet TAKE ONE TABLET BY MOUTH ONCE DAILY  30 tablet  3  . furosemide (LASIX) 40 MG tablet TAKE ONE TABLET BY MOUTH TWICE DAILY  180 tablet  0  . Glucosamine-Chondroitin (GLUCOSAMINE CHONDR COMPLEX PO) Take 1 capsule by mouth daily.       . metoprolol (LOPRESSOR) 50 MG tablet TAKE ONE TABLET BY MOUTH EVERY DAY  90 tablet  3  . Multiple Vitamin (MULTIVITAMIN) tablet Take 1 tablet by mouth daily.        . niacin (NIASPAN) 500 MG CR tablet Take 1,000 mg by mouth at bedtime.        . Omega-3 Fatty Acids (FISH OIL) 1000 MG CAPS Take 2 capsules by mouth daily.        . valACYclovir (VALTREX) 1000 MG tablet Take 1,000 mg by mouth 2 (two) times daily as needed. Take one tablet by mouth twice daily for three days for fever blisters       No current facility-administered medications for this visit.     Past Medical History  Diagnosis Date  . Atrial fibrillation     a. echo 4/07: EF 60%  . Hypertension   . Endometrial polyp   . Rectocele     WITHOUT MENTION OF UTERINE PROLAPSE  . Atrial tachycardia     a. s/p EPS 4/09: no inducible SVT - med Tx continued  . Complication of anesthesia     age 62yo, woke during procedure with GA, paralysed   . PONV (postoperative nausea and vomiting)   . SVD (spontaneous vaginal delivery)     x 1    ROS:   All systems reviewed and negative except as noted in the HPI.   Past Surgical History  Procedure Laterality Date  . Hysteroscopy    . Pylonidal cystect    . Leep    . Wisdom tooth extraction    . Colonoscopy    . Tubal ligation       Family History  Problem Relation Age of Onset  . Hypertension Father   . Diabetes Father   . Stroke Father   . Hypertension Mother   . Stroke Mother      History   Social History  . Marital Status: Married    Spouse Name: N/A    Number of Children: N/A  . Years of Education: N/A   Occupational History  . Not on file.   Social History Main Topics  . Smoking status: Former Smoker -- 0.50 packs/day for 10 years  Types: Cigarettes    Quit date: 02/22/1998  . Smokeless tobacco: Never Used  . Alcohol Use: No  . Drug Use: No  . Sexual Activity: Not on file   Other Topics Concern  . Not on file   Social History Narrative  . No narrative on file     BP 152/96  Pulse 59  Ht 5\' 7"  (1.702 m)  Wt 257 lb 6.4 oz (116.756 kg)  BMI 40.31 kg/m2  Physical Exam:  Well appearing obese, middle-age woman, NAD HEENT: Unremarkable Neck:  7 cm JVD, no thyromegally Back:  No CVA tenderness Lungs:  Clear with no wheezes, rales, or rhonchi. HEART:  Regular rate rhythm, no murmurs, no rubs, no clicks Abd:  soft, positive bowel sounds, no organomegally, no rebound, no guarding Ext:  2 plus pulses, no edema, no cyanosis, no clubbing Skin:  No rashes no nodules Neuro:  CN II through XII intact, motor grossly intact  EKG - normal sinus rhythm   Assess/Plan:

## 2012-10-04 NOTE — Patient Instructions (Addendum)
Your physician wants you to follow-up in: 12 months with Dr. Taylor. You will receive a reminder letter in the mail two months in advance. If you don't receive a letter, please call our office to schedule the follow-up appointment.    

## 2012-10-19 ENCOUNTER — Other Ambulatory Visit: Payer: Self-pay | Admitting: Gastroenterology

## 2012-12-20 ENCOUNTER — Encounter: Payer: 59 | Attending: Physician Assistant | Admitting: *Deleted

## 2012-12-20 ENCOUNTER — Encounter: Payer: Self-pay | Admitting: *Deleted

## 2012-12-20 VITALS — Ht 67.0 in | Wt 257.0 lb

## 2012-12-20 DIAGNOSIS — E669 Obesity, unspecified: Secondary | ICD-10-CM | POA: Insufficient documentation

## 2012-12-20 DIAGNOSIS — Z713 Dietary counseling and surveillance: Secondary | ICD-10-CM | POA: Insufficient documentation

## 2012-12-20 NOTE — Progress Notes (Signed)
Medical Nutrition Therapy:  Appt start time: 0930 end time:  1030.  Assessment:  Primary concerns today: Cindy Faulkner is here for nutrition counseling pertaining to obesity.  She has yo-yoed with her weight her entire adult life.  She can gain and lose 30-50 pounds.  She goes on diets on and off and she is struggling with losing weight this time.  She isn't as physically active due to knee pain.  She has a strong family history of diabetes and heart disease.  Her cholesterol is slightly elevated and her HbA1c is normal.   She feels like this is the worst her weight has ever been.  She has central adiposity.  Her lowest wight was 186 through Weight Wat[employer] and her highest weight is current.   She does the food shopping and she shares cooking responsibility with her husband.  Her schedule has gotten off kilter lately.  They bake most of the time and steamed vegetables.  She skips meals: breakfast and she can go without lunch as well.  She feels out of control with her eating sometimes, but denies binging.  She admits to emotional eating.  She eats at the kitchen table each night.  They do watch tv while eating.  She would eat at the table in the break room if she's at work.  She eats while distracted.  She admits to eating very quickly.She has participated in [employer] and exercise programs, complimentary medicine supplements including metamucil.  She feels like she is eating less after taking the metamucil  Preferred Learning Style:   Visual  Hands on  Learning Readiness:   Ready  MEDICATIONS: see list   DIETARY INTAKE:  Usual eating pattern includes 1-3 meals and 1 snacks per day.  Everyday foods include protein, starch, sometimes fruits and vegetables.  Avoided foods include chips and soda.    24-hr recall:  B ( AM): protein drink with soy milk and banana.  Or protein bar .  Skips on days off Snk ( AM): not usually  L ( PM): deli sandwich or grilled chicken sandwich.  Sometimes salads.   Gets fries usally Snk ( PM): not usually D ( PM): chick-fil-a;  Usually meat, starch, and vegetable.  Sometimes hamburgers with pinto beans or salad Snk ( PM): maybe peanut brittle or pumpkin pie Beverages: stopped drinking soda 3 weeks.  Water with lemon; rarely tea.  Sometimes lemonade  Usual physical activity: none outside of ADLs  Estimated energy needs: 1800 calories 200 g carbohydrates 135 g protein 50 g fat  Nutritional Diagnosis:  [place]-3.4 Unintentional weight gain As related to mindless eating and limited physical activity.  As evidenced by BMI.    Intervention:  Nutrition counseling provided.  Encouraged patient to reject traditional diet mentality of "good" vs "bad" foods.  There are no good and bad foods, but rather food is fuel that we needs for our bodies.  When we don't get enough fuel, our bodies suffer the metabolic consequences.  Encouraged patient to eat whatever foods will satisfy them, regardless of their nutritional value.  We will discuss nutritional values of foods at a subsequent appointment.  Encouraged patient to honor their body's internal hunger and fullness cues.  Throughout the day, check in mentally and rate hunger.  Try not to eat when ravenous, but instead when slightly hungry.  Then choose food(s) that will be satisfying regardless of nutritional content.  Sit down to enjoy those foods.  Minimize distractions: turn off tv, put away books, work,  magazine,etc.  Make the meal last at least 20 minutes in order to give time to experience and register satiety.  Stop eating when full regardless of how much food is left on the plate.  Get more if still hungry.  The key is to honor fullness so throughout the meal, rate fullness factor and stop when comfortably full, but not stuffed.  Reminded patient that they can have any food they want, whenever they want, and however much they want.  Eventually the novelty will wear out and each food will be equal in terms of its emotional  appeal.  This will be a learning process and some days more food will be eaten, some days less.  The key is to honor hunger and fullness without any feelings of guilt.  Pay attention to what the internal cues are, rather than any external factors.   Teaching Method Utilized:  Visual Auditory   Barriers to learning/adherence to lifestyle change: none  Demonstrated degree of understanding via:  Teach Back   Monitoring/Evaluation:  Dietary intake, exercise, and body weight in 1 month(s).

## 2012-12-29 ENCOUNTER — Other Ambulatory Visit: Payer: Self-pay

## 2012-12-29 DIAGNOSIS — Z8679 Personal history of other diseases of the circulatory system: Secondary | ICD-10-CM

## 2012-12-29 MED ORDER — FLECAINIDE ACETATE 50 MG PO TABS: ORAL_TABLET | ORAL | Status: DC

## 2013-01-25 ENCOUNTER — Ambulatory Visit: Payer: 59 | Admitting: *Deleted

## 2013-02-07 ENCOUNTER — Ambulatory Visit: Payer: 59 | Admitting: *Deleted

## 2013-03-08 ENCOUNTER — Emergency Department (HOSPITAL_COMMUNITY)
Admission: EM | Admit: 2013-03-08 | Discharge: 2013-03-08 | Disposition: A | Discharge status: Home / Self Care | Payer: 59 | Source: Home / Self Care

## 2013-03-21 ENCOUNTER — Encounter: Payer: 59 | Attending: Physician Assistant | Admitting: *Deleted

## 2013-03-21 ENCOUNTER — Encounter: Payer: Self-pay | Admitting: *Deleted

## 2013-03-21 DIAGNOSIS — Z713 Dietary counseling and surveillance: Secondary | ICD-10-CM | POA: Insufficient documentation

## 2013-03-21 DIAGNOSIS — E669 Obesity, unspecified: Secondary | ICD-10-CM | POA: Insufficient documentation

## 2013-03-21 NOTE — Progress Notes (Signed)
Medical Nutrition Therapy:  Appt start time: 1230 end time:  1300.  Assessment:  Primary concerns today: Cindy Faulkner is here for nutrition care counseling pertaining to obesity.  She was seen in our clinic in October and is here to follow up.   We discussed intuitive eating last visit.   However, Cindy Faulkner has not been able to make much progress towards mindful eating.  She still skips meals, delays meals, and eats while distracted   Preferred Learning Style:   Visual   Learning Readiness:   Contemplating.  She wants to make changes, but making it actually happen has been difficult  MEDICATIONS: see list   DIETARY INTAKE:  Usual eating pattern includes 2-3 meals and 1-3 snacks per day.  Everyday foods include proteins, starches.  Avoided foods include none.    24-hr recall:  B ( AM): protein bar on way to work.  Will skip if not working Snk ( AM): cup of oatmeal  L ( PM): grilled cheese at home and some chips; at work might have grilled [place] sandwich or something from the cafeteria Snk ( PM): at work gets PB crackers.  Might get cookie and lemonade.  Might have yogurt or peanuts from the vending machine.  If at home: might get another sandwich D ( PM): fast food; meat and 2 sides; spaghetti; rotisserie chicken with salad or mashed potatoes and green beans; hamburger patties with steak fries Snk ( PM): occasionally might have danish; ice cream Beverages: 1 soda/day; water; sweet tea when she goes out to eat  Usual physical activity: none  Estimated energy needs: 1800 calories 200 g carbohydrates 135 g protein 50 g fat   Nutritional Diagnosis:  [place]-1.5 Disordered eating pattern As related to meal skipping and mindless eating.  As evidenced by dietary recall.    Intervention: Encouraged patient to reject traditional diet mentality of "good" vs "bad" foods.  There are no good and bad foods, but rather food is fuel that we needs for our bodies.  When we don't get enough fuel, our bodies  suffer the metabolic consequences.  Encouraged patient to eat whatever foods will satisfy them, regardless of their nutritional value.  We will discuss nutritional values of foods at a subsequent appointment.  Encouraged patient to honor their body's internal hunger and fullness cues.  Throughout the day, check in mentally and rate hunger.  Try not to eat when ravenous, but instead when slightly hungry.  Then choose food(s) that will be satisfying regardless of nutritional content.  Sit down to enjoy those foods.  Minimize distractions: turn off tv, put away books, work, Brewing technologist.  Make the meal last at least 20 minutes in order to give time to experience and register satiety.  Stop eating when full regardless of how much food is left on the plate.  Get more if still hungry.  The key is to honor fullness so throughout the meal, rate fullness factor and stop when comfortably full, but not stuffed.  Reminded patient that they can have any food they want, whenever they want, and however much they want.  Eventually the novelty will wear out and each food will be equal in terms of its emotional appeal.  This will be a learning process and some days more food will be eaten, some days less.  The key is to honor hunger and fullness without any feelings of guilt.  Pay attention to what the internal cues are, rather than any external factors.  Goals: take 30 minutes on  days off for self- walk, take bath, read, listen to music, etc.  Make self a priority On work days, don't work during lunch.  Take a break  Teaching Method Utilized: Auditory: she took notes   Barriers to learning/adherence to lifestyle change: making self a priority   Demonstrated degree of understanding via:  Teach Back   Monitoring/Evaluation:  Dietary intake, exercise, and body weight in 1 month(s).

## 2013-10-09 ENCOUNTER — Ambulatory Visit (INDEPENDENT_AMBULATORY_CARE_PROVIDER_SITE_OTHER): Payer: Commercial Managed Care - PPO | Admitting: General Surgery

## 2013-10-09 ENCOUNTER — Encounter (INDEPENDENT_AMBULATORY_CARE_PROVIDER_SITE_OTHER): Payer: Self-pay | Admitting: General Surgery

## 2013-10-09 VITALS — BP 126/76 | HR 80 | Temp 97.0°F | Ht 67.0 in | Wt 250.0 lb

## 2013-10-09 DIAGNOSIS — IMO0002 Reserved for concepts with insufficient information to code with codable children: Secondary | ICD-10-CM

## 2013-10-09 DIAGNOSIS — L02411 Cutaneous abscess of right axilla: Secondary | ICD-10-CM

## 2013-10-09 MED ORDER — MUPIROCIN CALCIUM 2 % EX CREA: 1.0000 "application " | TOPICAL_CREAM | Freq: Two times a day (BID) | CUTANEOUS | Status: DC

## 2013-10-09 NOTE — Progress Notes (Signed)
Patient ID: Cindy Faulkner, female   DOB: 1965-05-02, 48 y.o.   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 She has no prior history of soft tissue infections. She is referred by Dr. Kelton Pillar because of recurrent MRSA abscess right axilla. 6 weeks ago she developed 3 small boils in her right axilla but did not drain. Reportedly there was MRSA cultured from this. She took 2 weeks of Septra and they resolved. She now presents with her second episode of swelling and redness and tenderness. She says it was very painful yesterday. She was started on doxycycline yesterday. It drained some overnight she feels better today. Dr. Laurann Montana referred her for possible incision and drainage  Past history reveals atrial fibrillation followed Lovena Le. Hypertension. Mild obesity.Endometrial polyps. Medications include Valtrex, fish oil, Niaspan, Lopressor, glucosamine, Lasix, flecainide, Pepcid, aspirin, alprazolam.  10 system review of systems is performed and is noncontributory except as described above  Exam:  pleasant patient. No distress. Daughter is with her Gait normal. Head and neck reveal no acute inflammatory skin lesions. Normocephalic. Chest wall: Right axilla reveals some erythema and 3 small punctate wounds but no purulence or undrained abscess. There's little bit of induration. I probed this area there was no undrained pus. It was only slightly tender.  Assessment: Recurrent MRSA abscess right axilla, and significantly improved over last 24 hours. No evidence of undrained abscess at this point in time. Hopefully this will respond to local care and antibiotics  Plan: Continue doxycycline. She has a 2 week supply Warm soaks 3 times a day Dry gauze bandage Return to see me in 2 weeks. If this has not completely healed I may extend her antibiotics another 2 weeks She knows that if she develops swelling and abscess to call me and we will consider  incision and drainage. Hopefully this will not  be necessary. I did call in a prescription for Bactroban  ointment, applied to the nares twice a day to hopefully reduce the carrier state    [facility]. Dalbert Batman, M.D., [facility] General and Minimally invasive Surgery Breast and Colorectal Surgery Office:   [phone] Pager:   [phone]

## 2013-10-09 NOTE — Patient Instructions (Signed)
The recurrent abscess in your right axilla seems better today. There does not appear to be any undrained abscess.  Most likely, this will respond to antibiotics. There is a small chance that it could recur and require surgical intervention.  Apply warm soaks to this area 2 or 3 times a day. Where a dry gauze bandage to collect the drainage. Continue the doxycycline for 2 weeks.  Bactroban ointment. Apply a thin application to both nares twice a day. We sent this prescription to your pharmacy  Return to see Dr. Dalbert Batman in 2 weeks to make sure that this is getting better.

## 2013-10-18 ENCOUNTER — Telehealth (INDEPENDENT_AMBULATORY_CARE_PROVIDER_SITE_OTHER): Payer: Self-pay

## 2013-10-18 NOTE — Telephone Encounter (Signed)
V/M appt with DR. Dalbert Batman 10/22/13 5p

## 2013-10-22 ENCOUNTER — Encounter (INDEPENDENT_AMBULATORY_CARE_PROVIDER_SITE_OTHER): Payer: Commercial Managed Care - PPO | Admitting: General Surgery

## 2013-10-31 ENCOUNTER — Other Ambulatory Visit: Payer: Self-pay | Admitting: Internal Medicine

## 2013-10-31 ENCOUNTER — Encounter (INDEPENDENT_AMBULATORY_CARE_PROVIDER_SITE_OTHER): Payer: Commercial Managed Care - PPO | Admitting: General Surgery

## 2013-11-02 ENCOUNTER — Other Ambulatory Visit: Payer: Self-pay | Admitting: *Deleted

## 2013-11-02 DIAGNOSIS — I10 Essential (primary) hypertension: Secondary | ICD-10-CM

## 2013-11-02 MED ORDER — METOPROLOL TARTRATE 50 MG PO TABS: 50.0000 mg | ORAL_TABLET | Freq: Every day | ORAL | Status: DC

## 2013-11-09 ENCOUNTER — Encounter (INDEPENDENT_AMBULATORY_CARE_PROVIDER_SITE_OTHER): Payer: Commercial Managed Care - PPO | Admitting: General Surgery

## 2014-01-02 ENCOUNTER — Other Ambulatory Visit: Payer: Self-pay

## 2014-01-03 ENCOUNTER — Other Ambulatory Visit: Payer: Self-pay

## 2014-01-03 MED ORDER — FLECAINIDE ACETATE 50 MG PO TABS: ORAL_TABLET | ORAL | Status: DC

## 2014-01-04 ENCOUNTER — Other Ambulatory Visit: Payer: Self-pay | Admitting: *Deleted

## 2014-01-04 MED ORDER — FUROSEMIDE 40 MG PO TABS: 40.0000 mg | ORAL_TABLET | Freq: Two times a day (BID) | ORAL | Status: DC

## 2014-01-28 ENCOUNTER — Other Ambulatory Visit: Payer: Self-pay | Admitting: Internal Medicine

## 2014-02-06 ENCOUNTER — Other Ambulatory Visit: Payer: Self-pay

## 2014-02-06 MED ORDER — METOPROLOL TARTRATE 50 MG PO TABS: 50.0000 mg | ORAL_TABLET | Freq: Every day | ORAL | Status: DC

## 2014-02-08 ENCOUNTER — Encounter: Payer: Self-pay | Admitting: Physician Assistant

## 2014-02-08 MED ORDER — METOPROLOL TARTRATE 50 MG PO TABS: 50.0000 mg | ORAL_TABLET | Freq: Every day | ORAL | Status: DC

## 2014-02-12 ENCOUNTER — Encounter: Payer: Self-pay | Admitting: Internal Medicine

## 2014-03-06 ENCOUNTER — Other Ambulatory Visit: Payer: Self-pay | Admitting: Internal Medicine

## 2014-03-22 ENCOUNTER — Encounter: Payer: Self-pay | Admitting: Internal Medicine

## 2014-03-22 ENCOUNTER — Ambulatory Visit (INDEPENDENT_AMBULATORY_CARE_PROVIDER_SITE_OTHER): Payer: 59 | Admitting: Internal Medicine

## 2014-03-22 DIAGNOSIS — I471 Supraventricular tachycardia: Secondary | ICD-10-CM | POA: Insufficient documentation

## 2014-03-22 DIAGNOSIS — I4719 Other supraventricular tachycardia: Secondary | ICD-10-CM

## 2014-03-22 DIAGNOSIS — I10 Essential (primary) hypertension: Secondary | ICD-10-CM

## 2014-03-22 MED ORDER — FLECAINIDE ACETATE 50 MG PO TABS: ORAL_TABLET | ORAL | Status: DC

## 2014-03-22 MED ORDER — METOPROLOL TARTRATE 50 MG PO TABS: ORAL_TABLET | ORAL | Status: DC

## 2014-03-22 NOTE — Progress Notes (Signed)
HPI [name] returns today for followup. She is a very pleasant 49 year old woman with a history of hypertension, obesity, paroxysmal atrial fibrillation and atrial tachycardia, and dyslipidemia. In the interim she has been stable. When I saw her last, she was considering having bariatric surgery. However she reports that she has lost over 40 lbs on weight watchers. The patient's atrial arrhythmias have been well-controlled with very low-dose flecainide though off of her flecainide in the last week she may have had some increase in her palpitations. No Known Allergies   Current Outpatient Prescriptions  Medication Sig Dispense Refill  . ALPRAZolam (XANAX) 0.25 MG tablet Take 0.25 mg by mouth at bedtime as needed for sleep.     Marland Kitchen aspirin 325 MG tablet Take 325 mg by mouth daily.      . flecainide (TAMBOCOR) 50 MG tablet TAKE 1 TABLET BY MOUTH ONCE DAILY. NEED APPOINTMENT. (Patient taking differently: TAKE 1 TABLET BY MOUTH ONCE DAILY.) 15 tablet 0  . furosemide (LASIX) 40 MG tablet Take 1 tablet (40 mg total) by mouth 2 (two) times daily. 180 tablet 0  . Glucosamine-Chondroitin (GLUCOSAMINE CHONDR COMPLEX PO) Take 1 capsule by mouth daily.     . metoprolol (LOPRESSOR) 50 MG tablet TAKE 1 TABLET BY MOUTH ONCE DAILY. PATIENT NEEDS AN APPOINTMENT FOR FURTHER REFILLS (Patient taking differently: TAKE 1 TABLET BY MOUTH ONCE DAILY.) 15 tablet 0  . Multiple Vitamin (MULTIVITAMIN) tablet Take 1 tablet by mouth daily.      . mupirocin cream (BACTROBAN) 2 % Apply 1 application topically 2 (two) times daily. Apply both nares BID until gone 15 g 1  . Omega-3 Fatty Acids (FISH OIL) 1000 MG CAPS Take 2 capsules by mouth daily.      . pantoprazole (PROTONIX) 40 MG tablet Take 40 mg by mouth daily.    . valACYclovir (VALTREX) 1000 MG tablet Take 1,000 mg by mouth 2 (two) times daily as needed. Take one tablet by mouth twice daily for three days for fever blisters     No current facility-administered medications  for this visit.     Past Medical History  Diagnosis Date  . Atrial fibrillation     a. echo 4/07: EF 60%  . Hypertension   . Endometrial polyp   . Rectocele     WITHOUT MENTION OF UTERINE PROLAPSE  . Atrial tachycardia     a. s/p EPS 4/09: no inducible SVT - med Tx continued  . Complication of anesthesia     age 58yo, woke during procedure with GA, paralysed   . PONV (postoperative nausea and vomiting)   . SVD (spontaneous vaginal delivery)     x 1    ROS:   All systems reviewed and negative except as noted in the HPI.   Past Surgical History  Procedure Laterality Date  . Hysteroscopy    . Pylonidal cystect    . Leep    . Wisdom tooth extraction    . Colonoscopy    . Tubal ligation       Family History  Problem Relation Age of Onset  . Hypertension Father   . Diabetes Father   . Stroke Father   . Hypertension Mother   . Stroke Mother      History   Social History  . Marital Status: Married    Spouse Name: N/A    Number of Children: N/A  . Years of Education: N/A   Occupational History  . Not on file.   Social  History Main Topics  . Smoking status: Former Smoker -- 0.50 packs/day for 10 years    Types: Cigarettes    Quit date: 02/22/1998  . Smokeless tobacco: Never Used  . Alcohol Use: No  . Drug Use: No  . Sexual Activity: Not on file   Other Topics Concern  . Not on file   Social History Narrative     BP 156/90 mmHg  Pulse 79  Ht 5\' 7"  (1.702 m)  Wt 229 lb 2 oz (103.93 kg)  BMI 35.88 kg/m2  Physical Exam:  Well appearing obese, middle-age woman, NAD HEENT: Unremarkable Neck:  7 cm JVD, no thyromegally Back:  No CVA tenderness Lungs:  Clear with no wheezes, rales, or rhonchi. HEART:  Regular rate rhythm, no murmurs, no rubs, no clicks Abd:  soft, positive bowel sounds, no organomegally, no rebound, no guarding Ext:  2 plus pulses, no edema, no cyanosis, no clubbing Skin:  No rashes no nodules Neuro:  CN II through XII intact,  motor grossly intact  EKG - normal sinus rhythm   Assess/Plan:

## 2014-03-22 NOTE — Assessment & Plan Note (Signed)
Her symptoms remain mostly controlled on Flecainide. She will continue her current meds.

## 2014-03-22 NOTE — Patient Instructions (Signed)
Your physician has recommended you make the following change in your medication:  1) Restart metoprolol tartrate 50 mg one tablet by mouth once daily 2) Restart flecainide 50 mg one tablet by mouth once daily ** both refills have been sent to the pharmacy today  Your physician wants you to follow-up in: 1 year with Dr. Lovena Le. You will receive a reminder letter in the mail two months in advance. If you don't receive a letter, please call our office to schedule the follow-up appointment.

## 2014-03-22 NOTE — Assessment & Plan Note (Signed)
Her blood pressure is elevated today but she has been off of her meds for a week. We will restart her meds back. She will hopefully be able to lose more weight.

## 2014-03-22 NOTE — Assessment & Plan Note (Signed)
She has lost over 40 lbs and hopes to lose another 40. She will continue with her weight watchers which is working for her nicely.

## 2014-05-15 ENCOUNTER — Ambulatory Visit: Payer: 59 | Attending: Orthopedic Surgery

## 2014-05-15 DIAGNOSIS — M79672 Pain in left foot: Secondary | ICD-10-CM | POA: Diagnosis present

## 2014-05-15 DIAGNOSIS — R609 Edema, unspecified: Secondary | ICD-10-CM | POA: Insufficient documentation

## 2014-05-15 NOTE — Patient Instructions (Signed)

## 2014-05-15 NOTE — Therapy (Signed)
[place], [place], [address] Phone: [phone]   Fax:  [phone]  Physical Therapy Evaluation  Patient Details  Name: Cindy Faulkner MRN: [id number] Date of Birth: [birth year] Referring Provider:  Wylene Simmer, MD  Encounter Date: 05/15/2014      PT End of Session - 05/15/14 1454    Visit Number 1   Number of Visits 6   Date for PT Re-Evaluation 05/31/14   PT Start Time 0215   PT Stop Time 0254   PT Time Calculation (min) 39 min   Activity Tolerance Patient tolerated treatment well   Behavior During Therapy [facility] for tasks assessed/performed      Past Medical History  Diagnosis Date  . Atrial fibrillation     a. echo 4/07: EF 60%  . Hypertension   . Endometrial polyp   . Rectocele     WITHOUT MENTION OF UTERINE PROLAPSE  . Atrial tachycardia     a. s/p EPS 4/09: no inducible SVT - med Tx continued  . Complication of anesthesia     age 49yo, woke during procedure with GA, paralysed   . PONV (postoperative nausea and vomiting)   . SVD (spontaneous vaginal delivery)     x 1    Past Surgical History  Procedure Laterality Date  . Hysteroscopy    . Pylonidal cystect    . Leep    . Wisdom tooth extraction    . Colonoscopy    . Tubal ligation      There were no vitals filed for this visit.  Visit Diagnosis:  Pain in left foot - Plan: PT plan of care cert/re-cert  Edema - Plan: PT plan of care cert/re-cert      Subjective Assessment - 05/15/14 1416    Symptoms Burning and pins and needle. She receives laer monthly at chiropractor. X ray fine. Pulling into eversion gives pain   Pertinent History She reports no injury. She bega to ahve pain on dorsum of LT foot   Limitations Walking;Standing   How long can you sit comfortably? As needed   How long can you stand comfortably? varies   How long can you walk comfortably? varies   Diagnostic tests x ray negative   Patient Stated Goals  Eliminate pain.    Currently in Pain? No/denies   Multiple Pain Sites No            [facility] PT Assessment - 05/15/14 1421    Assessment   Medical Diagnosis acute Lt foot pain , anterior tibial pain   Onset Date --  > 6 months ago   Precautions   Precautions None   Restrictions   Weight Bearing Restrictions No   Balance Screen   Has the patient fallen in the past 6 months No   Has the patient had a decrease in activity level because of a fear of falling?  No   Prior Function   Level of Independence Independent with basic ADLs   ROM / Strength   AROM / PROM / Strength AROM;Strength   AROM   AROM Assessment Site Ankle   Right/Left Ankle Left;Right   Right Ankle Dorsiflexion 20   Right Ankle Plantar Flexion 60   Right Ankle Inversion 40   Right Ankle Eversion 20   Left Ankle Dorsiflexion 20   Left Ankle Plantar Flexion 60   Left Ankle Inversion 40   Strength   Strength Assessment Site Ankle   Right/Left Ankle Left;Right  Right Ankle Dorsiflexion 5/5   Right Ankle Plantar Flexion 5/5   Right Ankle Inversion 5/5   Right Ankle Eversion 5/5   Left Ankle Dorsiflexion 5/5   Left Ankle Plantar Flexion 5/5   Left Ankle Inversion 5/5   Left Ankle Eversion 5/5   Ambulation/Gait   Gait Comments Normal pattern and pace/speed. no antalgic gait        Ionto applied to dorsum of LT foot 1 cc of 4mg  /ml dexamethasone                    PT Education - 05/15/14 1454    Education provided Yes   Education Details POC, ionto precautions   Person(s) Educated Patient   Methods Explanation   Comprehension Verbalized understanding             PT Long Term Goals - 05/15/14 1457    PT LONG TERM GOAL #1   Title She will report none to max 1-2/10 pain in 10 days with walking at work   Time 3   Period Weeks   Status New               Plan - 05/15/14 1458    PT Treatment/Interventions Cryotherapy;Ultrasound;Patient/family education;Manual techniques          Problem List Patient Active Problem List   Diagnosis Date Noted  . Atrial tachycardia 03/22/2014  . Morbid obesity 03/22/2014  . Abscess of axilla, right 10/09/2013  . Endometrial polyp 10/08/2011    Class: Present on Admission  . Preoperative evaluation to rule out surgical contraindication 10/04/2011  . Chest pain, unspecified 06/24/2010  . Shortness of breath 06/24/2010  . Essential hypertension 10/20/2008  . RECTOCELE WITHOUT MENTION OF UTERINE PROLAPSE 10/20/2008  . ENDOMETRIAL POLYP 10/20/2008  . ATRIAL FIBRILLATION, HX OF 10/20/2008    Darrel Hoover PT 05/15/2014, 3:00 PM  [facility] [address] [place], [place], [address] Phone: [phone]   Fax:  [phone]

## 2014-05-21 ENCOUNTER — Ambulatory Visit: Payer: 59

## 2014-05-21 DIAGNOSIS — M79672 Pain in left foot: Secondary | ICD-10-CM | POA: Diagnosis not present

## 2014-05-21 NOTE — Telephone Encounter (Signed)
This encounter was created in error - please disregard.

## 2014-05-21 NOTE — Therapy (Signed)
[place], [place], [address] Phone: [phone]   Fax:  [phone]  Physical Therapy Treatment  Patient Details  Name: Cindy Faulkner MRN: [id number] Date of Birth: 1965/11/09 Referring Provider:  Lennie Odor, PA-C  Encounter Date: 05/21/2014      PT End of Session - 05/21/14 0751    Visit Number 2   Number of Visits 6   Date for PT Re-Evaluation 05/31/14   PT Start Time 0700   PT Stop Time 0710   PT Time Calculation (min) 10 min   Activity Tolerance Patient tolerated treatment well   Behavior During Therapy [facility] for tasks assessed/performed      Past Medical History  Diagnosis Date  . Atrial fibrillation     a. echo 4/07: EF 60%  . Hypertension   . Endometrial polyp   . Rectocele     WITHOUT MENTION OF UTERINE PROLAPSE  . Atrial tachycardia     a. s/p EPS 4/09: no inducible SVT - med Tx continued  . Complication of anesthesia     age 49yo, woke during procedure with GA, paralysed   . PONV (postoperative nausea and vomiting)   . SVD (spontaneous vaginal delivery)     x 1    Past Surgical History  Procedure Laterality Date  . Hysteroscopy    . Pylonidal cystect    . Leep    . Wisdom tooth extraction    . Colonoscopy    . Tubal ligation      There were no vitals filed for this visit.  Visit Diagnosis:  Pain in left foot      Subjective Assessment - 05/21/14 0715    Symptoms Did well with patch with 2 small spots skin irritation   Currently in Pain? Yes   Pain Score 2    Pain Location Foot   Pain Orientation Right   Pain Descriptors / Indicators Tender   Pain Type Acute pain   Pain Onset In the past 7 days   Pain Frequency Intermittent   Aggravating Factors  Walking   Effect of Pain on Daily Activities None   Multiple Pain Sites No                       [facility] Adult PT Treatment/Exercise - 05/21/14 0717    Modalities   Modalities Iontophoresis   Iontophoresis   Type of Iontophoresis Dexamethasone   Location foot   Dose 1cc   Time 4 hours with patch                     PT Long Term Goals - 05/15/14 1457    PT LONG TERM GOAL #1   Title She will report none to max 1-2/10 pain in 10 days with walking at work   Time 3   Period Weeks   Status New               Plan - 05/21/14 0752    Clinical Impression Statement Continue next 2 days with patch as long as skin in good shape   PT Next Visit Plan ionto   Consulted and Agree with Plan of Care Patient        Problem List Patient Active Problem List   Diagnosis Date Noted  . Atrial tachycardia 03/22/2014  . Morbid obesity 03/22/2014  . Abscess of axilla, right 10/09/2013  . Endometrial polyp 10/08/2011    Class: Present  on Admission  . Preoperative evaluation to rule out surgical contraindication 10/04/2011  . Chest pain, unspecified 06/24/2010  . Shortness of breath 06/24/2010  . Essential hypertension 10/20/2008  . RECTOCELE WITHOUT MENTION OF UTERINE PROLAPSE 10/20/2008  . ENDOMETRIAL POLYP 10/20/2008  . ATRIAL FIBRILLATION, HX OF 10/20/2008    Darrel Hoover PT 05/21/2014, 7:53 AM  [facility] [address] [place], [place], [address] Phone: [phone]   Fax:  [phone]

## 2014-05-21 NOTE — Patient Instructions (Signed)
Reviewed removal if irritation or in 4 hours and not to do long walk with patch in place

## 2014-05-22 ENCOUNTER — Ambulatory Visit: Payer: 59

## 2014-05-22 DIAGNOSIS — M79672 Pain in left foot: Secondary | ICD-10-CM

## 2014-05-22 NOTE — Therapy (Signed)
[place], [place], [address] Phone: [phone]   Fax:  [phone]  Physical Therapy Treatment  Patient Details  Name: Cindy Faulkner MRN: [id number] Date of Birth: 01/01/1966 Referring Provider:  Lennie Odor, PA-C  Encounter Date: 05/22/2014      PT End of Session - 05/22/14 0714    Visit Number 3   Number of Visits 6   Date for PT Re-Evaluation 05/31/14   PT Start Time 0700   PT Stop Time 0712   PT Time Calculation (min) 12 min   Activity Tolerance Patient tolerated treatment well   Behavior During Therapy [facility] for tasks assessed/performed      Past Medical History  Diagnosis Date  . Atrial fibrillation     a. echo 4/07: EF 60%  . Hypertension   . Endometrial polyp   . Rectocele     WITHOUT MENTION OF UTERINE PROLAPSE  . Atrial tachycardia     a. s/p EPS 4/09: no inducible SVT - med Tx continued  . Complication of anesthesia     age 49yo, woke during procedure with GA, paralysed   . PONV (postoperative nausea and vomiting)   . SVD (spontaneous vaginal delivery)     x 1    Past Surgical History  Procedure Laterality Date  . Hysteroscopy    . Pylonidal cystect    . Leep    . Wisdom tooth extraction    . Colonoscopy    . Tubal ligation      There were no vitals filed for this visit.  Visit Diagnosis:  Pain in left foot      Subjective Assessment - 05/22/14 0713    Symptoms Feesl great today . Even did a 50 min poer walk yesterday and was on feet for most of day   Pain Score 0-No pain   Multiple Pain Sites No                       [facility] Adult PT Treatment/Exercise - 05/22/14 0714    Iontophoresis   Type of Iontophoresis Dexamethasone   Location foot   Dose 1cc   Time 4 hours with patch                     PT Long Term Goals - 05/15/14 1457    PT LONG TERM GOAL #1   Title She will report none to max 1-2/10 pain in 10 days with walking at  work   Time 3   Period Weeks   Status New               Plan - 05/22/14 0714    Clinical Impression Statement Continue with ionto tomorrow nad 2-3x next week   PT Next Visit Plan ionto   Consulted and Agree with Plan of Care Patient        Problem List Patient Active Problem List   Diagnosis Date Noted  . Atrial tachycardia 03/22/2014  . Morbid obesity 03/22/2014  . Abscess of axilla, right 10/09/2013  . Endometrial polyp 10/08/2011    Class: Present on Admission  . Preoperative evaluation to rule out surgical contraindication 10/04/2011  . Chest pain, unspecified 06/24/2010  . Shortness of breath 06/24/2010  . Essential hypertension 10/20/2008  . RECTOCELE WITHOUT MENTION OF UTERINE PROLAPSE 10/20/2008  . ENDOMETRIAL POLYP 10/20/2008  . ATRIAL FIBRILLATION, HX OF 10/20/2008    Darrel Hoover PT 05/22/2014, 7:16  AM  [facility] [address] [place], [place], [address] Phone: [phone]   Fax:  [phone]

## 2014-05-23 ENCOUNTER — Ambulatory Visit: Payer: 59

## 2014-05-23 DIAGNOSIS — M79672 Pain in left foot: Secondary | ICD-10-CM | POA: Diagnosis not present

## 2014-05-23 NOTE — Patient Instructions (Signed)
Remove patch if irritating. 

## 2014-05-23 NOTE — Therapy (Signed)
[place], [place], [address] Phone: [phone]   Fax:  [phone]  Physical Therapy Treatment  Patient Details  Name: Cindy Faulkner MRN: [id number] Date of Birth: 15-Apr-1965 Referring Provider:  Lennie Odor, PA-C  Encounter Date: 05/23/2014      PT End of Session - 05/23/14 0707    Visit Number 4   Number of Visits 6   Date for PT Re-Evaluation 05/31/14   PT Start Time 0657   PT Stop Time 0708   PT Time Calculation (min) 11 min   Activity Tolerance Patient tolerated treatment well   Behavior During Therapy [facility] for tasks assessed/performed      Past Medical History  Diagnosis Date  . Atrial fibrillation     a. echo 4/07: EF 60%  . Hypertension   . Endometrial polyp   . Rectocele     WITHOUT MENTION OF UTERINE PROLAPSE  . Atrial tachycardia     a. s/p EPS 4/09: no inducible SVT - med Tx continued  . Complication of anesthesia     age 49yo, woke during procedure with GA, paralysed   . PONV (postoperative nausea and vomiting)   . SVD (spontaneous vaginal delivery)     x 1    Past Surgical History  Procedure Laterality Date  . Hysteroscopy    . Pylonidal cystect    . Leep    . Wisdom tooth extraction    . Colonoscopy    . Tubal ligation      There were no vitals filed for this visit.  Visit Diagnosis:  Pain in left foot      Subjective Assessment - 05/23/14 0707    Symptoms Skin has been fine(looks good without irritatiion)                       [facility] Adult PT Treatment/Exercise - 05/23/14 0705    Iontophoresis   Type of Iontophoresis Dexamethasone   Location foot   Dose 1cc   Time 4 hours with patch                     PT Long Term Goals - 05/15/14 1457    PT LONG TERM GOAL #1   Title She will report none to max 1-2/10 pain in 10 days with walking at work   Time 3   Period Weeks   Status New               Plan - 05/23/14 0708     Clinical Impression Statement If she continues to have progress with pian will discharge after next 2 visits, if not will add [place] and cont ionto four extra visits   PT Next Visit Plan ionto        Problem List Patient Active Problem List   Diagnosis Date Noted  . Atrial tachycardia 03/22/2014  . Morbid obesity 03/22/2014  . Abscess of axilla, right 10/09/2013  . Endometrial polyp 10/08/2011    Class: Present on Admission  . Preoperative evaluation to rule out surgical contraindication 10/04/2011  . Chest pain, unspecified 06/24/2010  . Shortness of breath 06/24/2010  . Essential hypertension 10/20/2008  . RECTOCELE WITHOUT MENTION OF UTERINE PROLAPSE 10/20/2008  . ENDOMETRIAL POLYP 10/20/2008  . ATRIAL FIBRILLATION, HX OF 10/20/2008    Darrel Hoover PT 05/23/2014, 7:10 AM  [facility] [address] [place], [place], [address] Phone: [phone]  Fax:  [phone]

## 2014-05-28 ENCOUNTER — Ambulatory Visit: Payer: 59

## 2014-05-29 ENCOUNTER — Ambulatory Visit: Payer: 59 | Attending: Orthopedic Surgery

## 2014-05-29 DIAGNOSIS — R609 Edema, unspecified: Secondary | ICD-10-CM | POA: Diagnosis not present

## 2014-05-29 DIAGNOSIS — M79672 Pain in left foot: Secondary | ICD-10-CM | POA: Diagnosis present

## 2014-05-29 NOTE — Patient Instructions (Signed)
Removal of patch if irritating or painful

## 2014-05-29 NOTE — Therapy (Signed)
[place], [place], [address] Phone: [phone]   Fax:  [phone]  Physical Therapy Treatment  Patient Details  Name: Cindy Faulkner MRN: [id number] Date of Birth: 1965/12/19 Referring Provider:  Lennie Odor, PA-C  Encounter Date: 05/29/2014      PT End of Session - 05/29/14 0723    Visit Number 5   Number of Visits 6   Date for PT Re-Evaluation 05/31/14   PT Start Time 0706   PT Stop Time 0716   PT Time Calculation (min) 10 min   Activity Tolerance Patient tolerated treatment well   Behavior During Therapy [facility] for tasks assessed/performed      Past Medical History  Diagnosis Date  . Atrial fibrillation     a. echo 4/07: EF 60%  . Hypertension   . Endometrial polyp   . Rectocele     WITHOUT MENTION OF UTERINE PROLAPSE  . Atrial tachycardia     a. s/p EPS 4/09: no inducible SVT - med Tx continued  . Complication of anesthesia     age 49yo, woke during procedure with GA, paralysed   . PONV (postoperative nausea and vomiting)   . SVD (spontaneous vaginal delivery)     x 1    Past Surgical History  Procedure Laterality Date  . Hysteroscopy    . Pylonidal cystect    . Leep    . Wisdom tooth extraction    . Colonoscopy    . Tubal ligation      There were no vitals filed for this visit.  Visit Diagnosis:  Pain in left foot      Subjective Assessment - 05/29/14 0722    Subjective Tender from work all day yesterday but still better                       [facility] Adult PT Treatment/Exercise - 05/29/14 0711    Iontophoresis   Type of Iontophoresis Dexamethasone   Location foot   Dose 1cc   Time 4 hours with patch                     PT Long Term Goals - 05/15/14 1457    PT LONG TERM GOAL #1   Title She will report none to max 1-2/10 pain in 10 days with walking at work   Time 3   Period Weeks   Status New               Plan - 05/29/14  0723    Clinical Impression Statement finish ionto tomorrow and assess benefit   PT Next Visit Plan ionto   Consulted and Agree with Plan of Care Patient        Problem List Patient Active Problem List   Diagnosis Date Noted  . Atrial tachycardia 03/22/2014  . Morbid obesity 03/22/2014  . Abscess of axilla, right 10/09/2013  . Endometrial polyp 10/08/2011    Class: Present on Admission  . Preoperative evaluation to rule out surgical contraindication 10/04/2011  . Chest pain, unspecified 06/24/2010  . Shortness of breath 06/24/2010  . Essential hypertension 10/20/2008  . RECTOCELE WITHOUT MENTION OF UTERINE PROLAPSE 10/20/2008  . ENDOMETRIAL POLYP 10/20/2008  . ATRIAL FIBRILLATION, HX OF 10/20/2008    Darrel Hoover PT 05/29/2014, 7:24 AM  [facility] [address] [place], [place], [address] Phone: [phone]   Fax:  [phone]

## 2014-05-30 ENCOUNTER — Ambulatory Visit: Payer: 59

## 2014-06-05 ENCOUNTER — Ambulatory Visit: Payer: 59

## 2014-06-05 DIAGNOSIS — M79672 Pain in left foot: Secondary | ICD-10-CM

## 2014-06-05 NOTE — Therapy (Signed)
[place], [place], [address] Phone: [phone]   Fax:  [phone]  Physical Therapy Treatment  Patient Details  Name: Cindy Faulkner MRN: [id number] Date of Birth: Dec 08, 1965 Referring Provider:  Lennie Odor, PA-C  Encounter Date: 06/05/2014      PT End of Session - 06/05/14 0752    Visit Number 6   Number of Visits 7   Date for PT Re-Evaluation 06/07/14   PT Start Time 0715   PT Stop Time 0725   PT Time Calculation (min) 10 min   Activity Tolerance Patient tolerated treatment well   Behavior During Therapy [facility] for tasks assessed/performed      Past Medical History  Diagnosis Date  . Atrial fibrillation     a. echo 4/07: EF 60%  . Hypertension   . Endometrial polyp   . Rectocele     WITHOUT MENTION OF UTERINE PROLAPSE  . Atrial tachycardia     a. s/p EPS 4/09: no inducible SVT - med Tx continued  . Complication of anesthesia     age 49yo, woke during procedure with GA, paralysed   . PONV (postoperative nausea and vomiting)   . SVD (spontaneous vaginal delivery)     x 1    Past Surgical History  Procedure Laterality Date  . Hysteroscopy    . Pylonidal cystect    . Leep    . Wisdom tooth extraction    . Colonoscopy    . Tubal ligation      There were no vitals filed for this visit.  Visit Diagnosis:  Pain in left foot      Subjective Assessment - 06/05/14 0749    Subjective PAin bad yesterday with work . Thing its from pressure of TEDS and shoes because when not working foot feels fine.    Currently in Pain? Yes   Pain Score 2    Multiple Pain Sites No                       [facility] Adult PT Treatment/Exercise - 06/05/14 0751    Iontophoresis   Type of Iontophoresis Dexamethasone   Location foot   Dose 1cc   Time 4 hours with patch                     PT Long Term Goals - 05/15/14 1457    PT LONG TERM GOAL #1   Title She will report none  to max 1-2/10 pain in 10 days with walking at work   Time 3   Period Weeks   Status New               Plan - 06/05/14 0753    Clinical Impression Statement Continue ionto x1 then return to MD. as she s doing better but still with times of significant pain at work   PT Next Visit Plan ionto   Consulted and Agree with Plan of Care Patient        Problem List Patient Active Problem List   Diagnosis Date Noted  . Atrial tachycardia 03/22/2014  . Morbid obesity 03/22/2014  . Abscess of axilla, right 10/09/2013  . Endometrial polyp 10/08/2011    Class: Present on Admission  . Preoperative evaluation to rule out surgical contraindication 10/04/2011  . Chest pain, unspecified 06/24/2010  . Shortness of breath 06/24/2010  . Essential hypertension 10/20/2008  . RECTOCELE WITHOUT MENTION OF UTERINE PROLAPSE 10/20/2008  .  ENDOMETRIAL POLYP 10/20/2008  . ATRIAL FIBRILLATION, HX OF 10/20/2008    Darrel Hoover PT 06/05/2014, 7:54 AM  [facility] [address] [place], [place], [address] Phone: [phone]   Fax:  [phone]

## 2014-06-05 NOTE — Patient Instructions (Signed)
Suggested she  Go to [employer] for assessment of gait  and footwear suggestions to support foot with high arch.

## 2014-06-07 ENCOUNTER — Ambulatory Visit: Payer: 59

## 2014-06-07 DIAGNOSIS — M79672 Pain in left foot: Secondary | ICD-10-CM | POA: Diagnosis not present

## 2014-06-07 NOTE — Therapy (Addendum)
[place], [place], [address] Phone: [phone]   Fax:  [phone]  Physical Therapy Treatment  Patient Details  Name: Cindy Faulkner MRN: [id number] Date of Birth: [birth year] Referring Provider:  Lennie Odor, PA-C  Encounter Date: 06/07/2014      PT End of Session - 06/07/14 0739    Visit Number 7   Number of Visits 7   PT Start Time 0700   PT Stop Time 0726   PT Time Calculation (min) 26 min   Activity Tolerance Patient tolerated treatment well   Behavior During Therapy [facility] for tasks assessed/performed      Past Medical History  Diagnosis Date  . Atrial fibrillation     a. echo 4/07: EF 60%  . Hypertension   . Endometrial polyp   . Rectocele     WITHOUT MENTION OF UTERINE PROLAPSE  . Atrial tachycardia     a. s/p EPS 4/09: no inducible SVT - med Tx continued  . Complication of anesthesia     age 49yo, woke during procedure with GA, paralysed   . PONV (postoperative nausea and vomiting)   . SVD (spontaneous vaginal delivery)     x 1    Past Surgical History  Procedure Laterality Date  . Hysteroscopy    . Pylonidal cystect    . Leep    . Wisdom tooth extraction    . Colonoscopy    . Tubal ligation      There were no vitals filed for this visit.  Visit Diagnosis:  Pain in left foot      Subjective Assessment - 06/07/14 0734    Subjective Pain still primarily at work. No pain today.    Currently in Pain? No/denies   Multiple Pain Sites No        Kineseotape for lift of medial foot. Ionto patch 1cc for application later. Discussed POC and use of tape. We agreed to be on hold for now and she will contact me if tape helpful.  She will see MD in next week or 2.                        PT Education - 06/07/14 0739    Education provided Yes   Education Details taping, ionto removal   Person(s) Educated Patient   Methods Explanation;Demonstration;Tactile  cues;Verbal cues   Comprehension Verbalized understanding             PT Long Term Goals - 06/07/14 0741    PT LONG TERM GOAL #1   Title She will report none to max 1-2/10 pain in 10 days with walking at work   Status Not Met               Plan - 06/07/14 0740    Clinical Impression Statement She will try tape, change of lacing and last ionto for next week and see MD. She will contact me if she feels tape of benefit. Keep on hold for now   Consulted and Agree with Plan of Care Patient        Problem List Patient Active Problem List   Diagnosis Date Noted  . Atrial tachycardia 03/22/2014  . Morbid obesity 03/22/2014  . Abscess of axilla, right 10/09/2013  . Endometrial polyp 10/08/2011    Class: Present on Admission  . Preoperative evaluation to rule out surgical contraindication 10/04/2011  . Chest pain, unspecified 06/24/2010  . Shortness of  breath 06/24/2010  . Essential hypertension 10/20/2008  . RECTOCELE WITHOUT MENTION OF UTERINE PROLAPSE 10/20/2008  . ENDOMETRIAL POLYP 10/20/2008  . ATRIAL FIBRILLATION, HX OF 10/20/2008    Darrel Hoover PT 06/07/2014, 7:41 AM  [facility] [address] [place], [place], [address] Phone: [phone]   Fax:  [phone]     PHYSICAL THERAPY DISCHARGE SUMMARY  Visits from Start of Care: 7  Current functional level related to goals / functional outcomes: She was having pain with prolonged time on feet at work She was to speak with MD and did not return   Remaining deficits: Pain with prolonged time on feet at work   Education / Equipment: HEP Plan: Patient agrees to discharge.  Patient goals were partially met. Patient is being discharged due to the physician's request.  ?????  She did not return after MD visit Darrel Hoover ,PT  10/28/13          4:26 PM

## 2014-06-07 NOTE — Patient Instructions (Signed)
Cindy Faulkner was instructed in application of kineseotape with lift of metatarsal with spiral pull medial to lateral with DF . She was issued extra pieces of tape to apply in next few days . I changed the lacing of LT shoe to avoid direct pressure to area of complaint.   She was issued the ionto patch to apply tonight with medication patch and saline in container to apply tonight.

## 2014-08-09 ENCOUNTER — Other Ambulatory Visit: Payer: Self-pay | Admitting: Obstetrics and Gynecology

## 2014-08-12 LAB — CYTOLOGY - PAP

## 2015-01-02 ENCOUNTER — Other Ambulatory Visit (HOSPITAL_COMMUNITY): Payer: Self-pay | Admitting: Specialist

## 2015-01-02 DIAGNOSIS — M7541 Impingement syndrome of right shoulder: Secondary | ICD-10-CM

## 2015-01-03 ENCOUNTER — Ambulatory Visit (HOSPITAL_COMMUNITY)
Admission: RE | Admit: 2015-01-03 | Discharge: 2015-01-03 | Disposition: A | Discharge status: Home / Self Care | Payer: 59 | Source: Ambulatory Visit | Attending: Specialist | Admitting: Specialist

## 2015-01-03 DIAGNOSIS — M7541 Impingement syndrome of right shoulder: Secondary | ICD-10-CM

## 2015-01-03 DIAGNOSIS — M25511 Pain in right shoulder: Secondary | ICD-10-CM | POA: Insufficient documentation

## 2015-01-03 DIAGNOSIS — M75101 Unspecified rotator cuff tear or rupture of right shoulder, not specified as traumatic: Secondary | ICD-10-CM | POA: Diagnosis not present

## 2015-01-13 ENCOUNTER — Other Ambulatory Visit (HOSPITAL_COMMUNITY): Payer: 59

## 2015-02-10 ENCOUNTER — Other Ambulatory Visit: Payer: Self-pay | Admitting: Internal Medicine

## 2015-03-18 ENCOUNTER — Ambulatory Visit (HOSPITAL_BASED_OUTPATIENT_CLINIC_OR_DEPARTMENT_OTHER): Payer: 59 | Attending: Family Medicine | Admitting: Radiology

## 2015-03-18 VITALS — Ht 67.0 in | Wt 227.0 lb

## 2015-03-18 DIAGNOSIS — R5383 Other fatigue: Secondary | ICD-10-CM | POA: Insufficient documentation

## 2015-03-18 DIAGNOSIS — R0683 Snoring: Secondary | ICD-10-CM | POA: Diagnosis not present

## 2015-03-18 DIAGNOSIS — I10 Essential (primary) hypertension: Secondary | ICD-10-CM | POA: Insufficient documentation

## 2015-03-18 DIAGNOSIS — G4733 Obstructive sleep apnea (adult) (pediatric): Secondary | ICD-10-CM | POA: Diagnosis not present

## 2015-03-18 DIAGNOSIS — G4719 Other hypersomnia: Secondary | ICD-10-CM | POA: Diagnosis not present

## 2015-03-22 DIAGNOSIS — G4733 Obstructive sleep apnea (adult) (pediatric): Secondary | ICD-10-CM | POA: Diagnosis not present

## 2015-03-22 NOTE — Progress Notes (Signed)
   Patient Name: Cindy Faulkner, Cindy Faulkner Date: 03/18/2015 Gender: Female D.O.B: 08/14/1965 Age (years): 49 Referring Provider: Kelton Pillar Height (inches): 76 Interpreting Physician: Baird Lyons MD, ABSM Weight (lbs): 227 RPSGT: Carolin Coy BMI: 36 MRN: [id number] Neck Size: 14.50 CLINICAL INFORMATION Sleep Study Type: NPSG Indication for sleep study: Excessive Daytime Sleepiness, Fatigue, Hypertension, Snoring Epworth Sleepiness Score: 16  SLEEP STUDY TECHNIQUE As per the AASM Manual for the Scoring of Sleep and Associated Events v2.3 (April 2016) with a hypopnea requiring 4% desaturations. The channels recorded and monitored were frontal, central and occipital EEG, electrooculogram (EOG), submentalis EMG (chin), nasal and oral airflow, thoracic and abdominal wall motion, anterior tibialis EMG, snore microphone, electrocardiogram, and pulse oximetry.  MEDICATIONS Patient's medications include: charted for review. Medications self-administered by patient during sleep study : No sleep medicine administered.  SLEEP ARCHITECTURE The study was initiated at 10:22:12 PM and ended at 5:03:01 AM. Sleep onset time was 6.5 minutes and the sleep efficiency was 89.6%. The total sleep time was 359.0 minutes. Stage REM latency was 68.5 minutes. The patient spent 10.03% of the night in stage N1 sleep, 74.37% in stage N2 sleep, 0.00% in stage N3 and 15.60% in REM. Alpha intrusion was absent. Supine sleep was 12.57%. Wake after sleep onset 35.4 minutes  RESPIRATORY PARAMETERS The overall apnea/hypopnea index (AHI) was 10.9 per hour. There were 1 total apneas, including 1 obstructive, 0 central and 0 mixed apneas. There were 64 hypopneas and 35 RERAs. The AHI during Stage REM sleep was 2.1 per hour. AHI while supine was 38.6 per hour. The mean oxygen saturation was 95.26%. The minimum SpO2 during sleep was 86.00%. Moderate snoring was noted during this study.  CARDIAC DATA The 2 lead  EKG demonstrated sinus rhythm. The mean heart rate was 58.81 beats per minute. Other EKG findings include: None.  LEG MOVEMENT DATA The total PLMS were 85 with a resulting PLMS index of 14.21. Associated arousal with leg movement index was 0.5   IMPRESSIONS - Mild obstructive sleep apnea occurred during this study (AHI = 10.9/h). - No significant central sleep apnea occurred during this study (CAI = 0.0/h). - Mild oxygen desaturation was noted during this study (Min O2 = 86.00%). - The patient snored with Moderate snoring volume. - No cardiac abnormalities were noted during this study. - Mild periodic limb movements of sleep occurred during the study. No significant associated arousals.  DIAGNOSIS - Obstructive Sleep Apnea (327.23 [G47.33 ICD-10])  RECOMMENDATIONS - Therapeutic CPAP titration to determine optimal pressure required to alleviate sleep disordered breathing. An oral appliance might be an alternative. - Positional therapy avoiding supine position during sleep. - Avoid alcohol, sedatives and other CNS depressants that may worsen sleep apnea and disrupt normal sleep architecture. - Sleep hygiene should be reviewed to assess factors that may improve sleep quality. - Weight management and regular exercise should be initiated or continued if appropriate.  Deneise Lever Diplomate, American Board of Sleep Medicine  ELECTRONICALLY SIGNED ON:  03/22/2015, 11:51 AM [place] PH: [phone]   FX: [phone] [place]

## 2015-04-23 ENCOUNTER — Ambulatory Visit: Payer: 59 | Admitting: Internal Medicine

## 2015-05-07 DIAGNOSIS — Z1231 Encounter for screening mammogram for malignant neoplasm of breast: Secondary | ICD-10-CM | POA: Diagnosis not present

## 2015-05-20 ENCOUNTER — Other Ambulatory Visit: Payer: Self-pay | Admitting: Internal Medicine

## 2015-05-21 MED FILL — METOPROLOL TARTRATE 50 MG T: 50 | 90 days supply | Qty: 90 | Fill #0

## 2015-05-21 MED FILL — FLECAINIDE ACETATE 50 MG TA: 50 | 90 days supply | Qty: 90 | Fill #0

## 2015-05-23 DIAGNOSIS — M25561 Pain in right knee: Secondary | ICD-10-CM | POA: Diagnosis not present

## 2015-05-23 DIAGNOSIS — M25562 Pain in left knee: Secondary | ICD-10-CM | POA: Diagnosis not present

## 2015-05-23 DIAGNOSIS — M170 Bilateral primary osteoarthritis of knee: Secondary | ICD-10-CM | POA: Diagnosis not present

## 2015-05-28 ENCOUNTER — Encounter: Payer: Self-pay | Admitting: Internal Medicine

## 2015-05-28 ENCOUNTER — Ambulatory Visit (INDEPENDENT_AMBULATORY_CARE_PROVIDER_SITE_OTHER): Payer: 59 | Admitting: Internal Medicine

## 2015-05-28 VITALS — BP 146/84 | HR 54 | Ht 67.0 in | Wt 244.2 lb

## 2015-05-28 DIAGNOSIS — I471 Supraventricular tachycardia: Secondary | ICD-10-CM | POA: Diagnosis not present

## 2015-05-28 NOTE — Patient Instructions (Signed)
Medication Instructions:  Your physician recommends that you continue on your current medications as directed. Please refer to the Current Medication list given to you today.   Labwork: None ordered   Testing/Procedures: None ordered   Follow-Up: None ordered Your physician wants you to follow-up in: 12 months with Dr Knox Saliva will receive a reminder letter in the mail two months in advance. If you don't receive a letter, please call our office to schedule the follow-up appointment.   Any Other Special Instructions Will Be Listed Below (If Applicable).     If you need a refill on your cardiac medications before your next appointment, please call your pharmacy.

## 2015-05-28 NOTE — Progress Notes (Signed)
HPI Cindy Faulkner returns today for followup. She is a very pleasant 50 year old woman with a history of hypertension, obesity, paroxysmal atrial fibrillation and atrial tachycardia, and dyslipidemia. In the interim she has been stable. Unfortunately, she has gained much of her weight back and when she did her HTN and palpitations returned and she is now back on her flecainide and her beta blocker.  No Known Allergies   Current Outpatient Prescriptions  Medication Sig Dispense Refill  . ALPRAZolam (XANAX) 0.25 MG tablet Take 0.25 mg by mouth at bedtime as needed for sleep.     Marland Kitchen aspirin 325 MG tablet Take 325 mg by mouth daily.      . flecainide (TAMBOCOR) 50 MG tablet TAKE 1 TABLET BY MOUTH ONCE DAILY 90 tablet 3  . furosemide (LASIX) 40 MG tablet TAKE 1 TABLET BY MOUTH 2 TIMES DAILY. 180 tablet 0  . Glucosamine-Chondroitin (GLUCOSAMINE CHONDR COMPLEX PO) Take 1 capsule by mouth daily.     . metoprolol (LOPRESSOR) 50 MG tablet TAKE ONE TABLET BY MOUTH ONCE DAILY 90 tablet 3  . Multiple Vitamin (MULTIVITAMIN) tablet Take 1 tablet by mouth daily.      . Omega-3 Fatty Acids (FISH OIL) 1000 MG CAPS Take 2 capsules by mouth daily.      . valACYclovir (VALTREX) 1000 MG tablet Take 1,000 mg by mouth 2 (two) times daily as needed. Take one tablet by mouth twice daily for three days for fever blisters     No current facility-administered medications for this visit.     Past Medical History  Diagnosis Date  . Atrial fibrillation ([place])     a. echo 4/07: EF 60%  . Hypertension   . Endometrial polyp   . Rectocele     WITHOUT MENTION OF UTERINE PROLAPSE  . Atrial tachycardia ([place])     a. s/p EPS 4/09: no inducible SVT - med Tx continued  . Complication of anesthesia     age 16yo, woke during procedure with GA, paralysed   . PONV (postoperative nausea and vomiting)   . SVD (spontaneous vaginal delivery)     x 1    ROS:   All systems reviewed and negative except as noted in the HPI.   Past  Surgical History  Procedure Laterality Date  . Hysteroscopy    . Pylonidal cystect    . Leep    . Wisdom tooth extraction    . Colonoscopy    . Tubal ligation       Family History  Problem Relation Age of Onset  . Hypertension Father   . Diabetes Father   . Stroke Father   . Hypertension Mother   . Stroke Mother      Social History   Social History  . Marital Status: Married    Spouse Name: N/A  . Number of Children: N/A  . Years of Education: N/A   Occupational History  . Not on file.   Social History Main Topics  . Smoking status: Former Smoker -- 0.50 packs/day for 10 years    Types: Cigarettes    Quit date: 02/22/1998  . Smokeless tobacco: Never Used  . Alcohol Use: No  . Drug Use: No  . Sexual Activity: Not on file   Other Topics Concern  . Not on file   Social History Narrative     BP 146/84 mmHg  Pulse 54  Ht 5\' 7"  (1.702 m)  Wt 244 lb 3.2 oz (110.768 kg)  BMI 38.24 kg/m2  Physical Exam:  Well appearing obese, middle-age woman, NAD HEENT: Unremarkable Neck:  7 cm JVD, no thyromegally Back:  No CVA tenderness Lungs:  Clear with no wheezes, rales, or rhonchi. HEART:  Regular rate rhythm, no murmurs, no rubs, no clicks Abd:  soft, positive bowel sounds, no organomegally, no rebound, no guarding Ext:  2 plus pulses, no edema, no cyanosis, no clubbing Skin:  No rashes no nodules Neuro:  CN II through XII intact, motor grossly intact  EKG - normal sinus rhythm   Assess/Plan: 1. Atrial tachy - she is doing well now that she is back on flecainide. She will continue her current meds and hopefully will be able to get off of her flecainide when she loses her weight. 2. HTN - her blood pressure has been reasonably well controlled. Hopefully she will come off of her metoprolol when she loses her weight 3. Obesity - we discussed the importance of weight loss. She has started back weight watchers.  Mikle Bosworth.D.

## 2015-07-10 DIAGNOSIS — H5213 Myopia, bilateral: Secondary | ICD-10-CM | POA: Diagnosis not present

## 2015-07-24 ENCOUNTER — Ambulatory Visit (INDEPENDENT_AMBULATORY_CARE_PROVIDER_SITE_OTHER): Payer: 59 | Admitting: Internal Medicine

## 2015-07-24 ENCOUNTER — Encounter: Payer: Self-pay | Admitting: Internal Medicine

## 2015-07-24 VITALS — BP 124/68 | HR 58 | Ht 67.5 in | Wt 247.8 lb

## 2015-07-24 DIAGNOSIS — I480 Paroxysmal atrial fibrillation: Secondary | ICD-10-CM

## 2015-07-24 DIAGNOSIS — G4733 Obstructive sleep apnea (adult) (pediatric): Secondary | ICD-10-CM | POA: Diagnosis not present

## 2015-07-24 MED ORDER — FLECAINIDE ACETATE 50 MG PO TABS: 50.0000 mg | ORAL_TABLET | Freq: Every day | ORAL | Status: DC

## 2015-07-24 NOTE — Patient Instructions (Signed)
Order- new DME, new CPAP,  Auto 5-15,  Mask of choice, humidifier, supplies, AirView    Dx OSA  Please call as needed

## 2015-07-24 NOTE — Assessment & Plan Note (Signed)
She describes significant weight change from year to year and may become a candidate for bariatric counseling.

## 2015-07-24 NOTE — Assessment & Plan Note (Signed)
We discussed the medical issues of untreated sleep apnea, importance of weight management, responsibility to drive sleep safely, basic sleep hygiene, treatment options. With her score, she could be appropriate for either CPAP or an oral appliance. She really wants to get control of the daytime tiredness and chooses to start with CPAP. Plan-new start CPAP auto 5-15

## 2015-07-24 NOTE — Progress Notes (Signed)
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 Complicating medical problems include HBP, A. fib, morbid obesity NPSG 03/18/15 for Dr Lady Deutscher- AHI 10.9/ hr, desaturation to 86%, body weight 227 pounds Anesthesia had told her that she desaturated significantly during the GYN procedure. She has been aware of loud snoring. Often wakes exhausted. Little caffeine. No ENT surgery. Weight fluctuates a lot, currently up 4050 pounds. Thyroid function okay. Episode of paroxysmal atrial fibrillation resolved with Cardizem and no recurrence on medication. ENT surgery-none. Some rhinorrhea and spring pollen season. Works dayshift  Prior to Admission medications   Medication Sig Start Date End Date Taking? Authorizing Provider  ALPRAZolam Duanne Moron) 0.25 MG tablet Take 0.25 mg by mouth at bedtime as needed for sleep.    Yes Historical Provider, MD  aspirin 325 MG tablet Take 325 mg by mouth daily.     Yes Historical Provider, MD  Biotin 1000 MCG tablet Take 1,000 mcg by mouth daily.   Yes Historical Provider, MD  cholecalciferol (VITAMIN D) 1000 units tablet Take 2,000 Units by mouth daily.   Yes Historical Provider, MD  furosemide (LASIX) 40 MG tablet TAKE 1 TABLET BY MOUTH 2 TIMES DAILY. Patient taking differently: TAKE 1 TABLET BY MOUTH daily 02/11/15  Yes Evans Lance, MD  Glucosamine-Chondroitin (GLUCOSAMINE CHONDR COMPLEX PO) Take 1 capsule by mouth daily.    Yes Historical Provider, MD  metoprolol (LOPRESSOR) 50 MG tablet TAKE ONE TABLET BY MOUTH ONCE DAILY 05/21/15  Yes Evans Lance, MD  Multiple Vitamin (MULTIVITAMIN) tablet Take 1 tablet by mouth daily.     Yes Historical Provider, MD  Omega-3 Fatty Acids (FISH OIL) 1000 MG CAPS Take 2 capsules by mouth daily.     Yes Historical Provider, MD  flecainide (TAMBOCOR) 50 MG tablet Take 1 tablet (50 mg total) by mouth daily. 07/24/15   Deneise Lever, MD  valACYclovir (VALTREX) 1000 MG  tablet Take 1,000 mg by mouth 2 (two) times daily as needed. Reported on 07/24/2015    Historical Provider, MD   Past Medical History  Diagnosis Date  . Atrial fibrillation ([place])     a. echo 4/07: EF 60%  . Hypertension   . Endometrial polyp   . Rectocele     WITHOUT MENTION OF UTERINE PROLAPSE  . Atrial tachycardia ([place])     a. s/p EPS 4/09: no inducible SVT - med Tx continued  . Complication of anesthesia     age 35yo, woke during procedure with GA, paralysed   . PONV (postoperative nausea and vomiting)   . SVD (spontaneous vaginal delivery)     x 1   Past Surgical History  Procedure Laterality Date  . Hysteroscopy    . Pylonidal cystect    . Leep    . Wisdom tooth extraction    . Colonoscopy    . Tubal ligation     Family History  Problem Relation Age of Onset  . Hypertension Father   . Diabetes Father   . Stroke Father   . Hypertension Mother   . Stroke Mother    Social History   Social History  . Marital Status: Married    Spouse Name: N/A  . Number of Children: N/A  . Years of Education: N/A   Occupational History  . Not on file.   Social History Main Topics  . Smoking status: Former Smoker -- 0.50 packs/day for 10 years    Types: Cigarettes  Quit date: 02/22/1998  . Smokeless tobacco: Never Used  . Alcohol Use: No  . Drug Use: No  . Sexual Activity: Not on file   Other Topics Concern  . Not on file   Social History Narrative   ROS-see HPI   Negative unless "+" Constitutional:    weight loss, night sweats, fevers, chills, fatigue, lassitude. HEENT:    headaches, difficulty swallowing, tooth/dental problems, sore throat,       sneezing, itching, ear ache, nasal congestion, post nasal drip, snoring CV:    chest pain, orthopnea, PND, swelling in lower extremities, anasarca,                                                    dizziness, palpitations Resp:   shortness of breath with exertion or at rest.                productive cough,    non-productive cough, coughing up of blood.              change in color of mucus.  wheezing.   Skin:    rash or lesions. GI:  No-   heartburn, indigestion, abdominal pain, nausea, vomiting, diarrhea,                 change in bowel habits, loss of appetite GU: dysuria, change in color of urine, no urgency or frequency.   flank pain. MS:   joint pain, stiffness, decreased range of motion, back pain. Neuro-     nothing unusual Psych:  change in mood or affect.  depression or anxiety.   memory loss.  OBJ- Physical Exam General- Alert, Oriented, Affect-appropriate, Distress- none acute, + Overweight Skin- rash-none, lesions- none, excoriation- none Lymphadenopathy- none Head- atraumatic            Eyes- Gross vision intact, PERRLA, conjunctivae and secretions clear            Ears- Hearing, canals-normal            Nose- Clear, no-Septal dev, mucus, polyps, erosion, perforation             Throat- Mallampati III , mucosa clear , drainage- none, tonsils- atrophic Neck- flexible , trachea midline, no stridor , thyroid nl, carotid no bruit Chest - symmetrical excursion , unlabored           Heart/CV- RRR , no murmur , no gallop  , no rub, nl s1 s2                           - JVD- none , edema- none, stasis changes- none, varices- none           Lung- clear to P&A, wheeze- none, cough- none , dullness-none, rub- none           Chest wall-  Abd-  Br/ Gen/ Rectal- Not done, not indicated Extrem- cyanosis- none, clubbing, none, atrophy- none, strength- nl Neuro- grossly intact to observation

## 2015-07-24 NOTE — Assessment & Plan Note (Signed)
Regular sinus rhythm by examination at this visit. Historically she has been well-controlled on maintenance medication. Common association of obstructive sleep apnea with atrial fibrillation is recognized.

## 2015-08-08 DIAGNOSIS — G4733 Obstructive sleep apnea (adult) (pediatric): Secondary | ICD-10-CM | POA: Diagnosis not present

## 2015-08-27 MED FILL — METOPROLOL TARTRATE 50 MG T: 50 | 90 days supply | Qty: 90 | Fill #1

## 2015-08-27 MED FILL — FLECAINIDE ACETATE 50 MG TA: 50 | 90 days supply | Qty: 90 | Fill #1

## 2015-09-07 DIAGNOSIS — G4733 Obstructive sleep apnea (adult) (pediatric): Secondary | ICD-10-CM | POA: Diagnosis not present

## 2015-10-01 DIAGNOSIS — Z01419 Encounter for gynecological examination (general) (routine) without abnormal findings: Secondary | ICD-10-CM | POA: Diagnosis not present

## 2015-10-01 DIAGNOSIS — Z6839 Body mass index (BMI) 39.0-39.9, adult: Secondary | ICD-10-CM | POA: Diagnosis not present

## 2015-10-01 DIAGNOSIS — N951 Menopausal and female climacteric states: Secondary | ICD-10-CM | POA: Diagnosis not present

## 2015-10-01 MED FILL — GABAPENTIN 100 MG CAPSULE: 100 | 30 days supply | Qty: 30 | Fill #0

## 2015-10-03 ENCOUNTER — Other Ambulatory Visit: Payer: Self-pay | Admitting: Internal Medicine

## 2015-10-03 ENCOUNTER — Other Ambulatory Visit: Payer: Self-pay | Admitting: *Deleted

## 2015-10-03 MED ORDER — FUROSEMIDE 40 MG PO TABS: 40.0000 mg | ORAL_TABLET | Freq: Two times a day (BID) | ORAL | 3 refills | Status: DC

## 2015-10-03 MED FILL — FUROSEMIDE 40 MG TABLET: 40 | 90 days supply | Qty: 180 | Fill #0

## 2015-10-03 MED FILL — ALPRAZolam 0.25 MG TABS: 0.25 | 10 days supply | Qty: 30 | Fill #0

## 2015-10-08 DIAGNOSIS — G4733 Obstructive sleep apnea (adult) (pediatric): Secondary | ICD-10-CM | POA: Diagnosis not present

## 2015-10-09 ENCOUNTER — Telehealth: Payer: Self-pay | Admitting: Internal Medicine

## 2015-10-09 NOTE — Telephone Encounter (Signed)
Spoke with Joellen Jersey to get a work in [employer]. Per Joellen Jersey can do 8/14 put in 11am slot but pt needs to arrive at 11:15. Called pt and appt scheduled. Nothing further needed

## 2015-10-15 DIAGNOSIS — M170 Bilateral primary osteoarthritis of knee: Secondary | ICD-10-CM | POA: Diagnosis not present

## 2015-10-15 DIAGNOSIS — M1711 Unilateral primary osteoarthritis, right knee: Secondary | ICD-10-CM | POA: Diagnosis not present

## 2015-10-15 DIAGNOSIS — M1712 Unilateral primary osteoarthritis, left knee: Secondary | ICD-10-CM | POA: Diagnosis not present

## 2015-10-21 MED FILL — FLUCONAZOLE 150 MG TABLET: 150 | 9 days supply | Qty: 6 | Fill #0

## 2015-10-21 MED FILL — NYSTATIN/TRIAMCINOLONE CRM: 100000-0.1 | 10 days supply | Qty: 30 | Fill #0

## 2015-10-29 ENCOUNTER — Ambulatory Visit: Payer: 59 | Admitting: Internal Medicine

## 2015-11-06 ENCOUNTER — Encounter: Payer: Self-pay | Admitting: Internal Medicine

## 2015-11-06 ENCOUNTER — Ambulatory Visit: Payer: 59 | Admitting: Internal Medicine

## 2015-11-07 ENCOUNTER — Encounter: Payer: Self-pay | Admitting: Internal Medicine

## 2015-11-07 ENCOUNTER — Ambulatory Visit (INDEPENDENT_AMBULATORY_CARE_PROVIDER_SITE_OTHER): Payer: 59 | Admitting: Internal Medicine

## 2015-11-07 ENCOUNTER — Telehealth: Payer: Self-pay | Admitting: Internal Medicine

## 2015-11-07 VITALS — BP 122/68 | HR 58 | Ht 67.5 in | Wt 257.4 lb

## 2015-11-07 DIAGNOSIS — I480 Paroxysmal atrial fibrillation: Secondary | ICD-10-CM | POA: Diagnosis not present

## 2015-11-07 DIAGNOSIS — G4733 Obstructive sleep apnea (adult) (pediatric): Secondary | ICD-10-CM

## 2015-11-07 NOTE — Patient Instructions (Signed)
Order- [facility]- continue CPAP auto -15, please help with mask fit. Consider smaller size, or try alternative design with chin strap (mask of choice), humidifier, supplies, AirView,    Dx OSA   Please call as needed

## 2015-11-07 NOTE — Assessment & Plan Note (Signed)
Download indicates good compliance and control at first follow-up. She is comfortable with pressure sore continue auto 5-15/[facility]. Quality of life is improving with better sleep. We need to emphasize mask comfort and we discussed how to work with DME company on this.

## 2015-11-07 NOTE — Assessment & Plan Note (Signed)
Sinus rhythm based on exam

## 2015-11-07 NOTE — Telephone Encounter (Signed)
Per CY, pt being added on to end of clinic today.  Pt aware, chan scheduling appt.  Nothing further needed.

## 2015-11-07 NOTE — Progress Notes (Signed)
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 Complicating medical problems include HBP, A. fib, morbid obesity NPSG 03/18/15 for Dr Lady Deutscher- AHI 10.9/ hr, desaturation to 86%, body weight 227 pounds Anesthesia had told her that she desaturated significantly during the GYN procedure. She has been aware of loud snoring. Often wakes exhausted. Little caffeine. No ENT surgery. Weight fluctuates a lot, currently up 4050 pounds. Thyroid function okay. Episode of paroxysmal atrial fibrillation resolved with Cardizem and no recurrence on medication. ENT surgery-none. Some rhinorrhea and spring pollen season. 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 DL attached. Pt states she wears CPAP nightly for about 6 hours. Pt has full face mask and having trouble with it-getting use to it. Otherwise sleeping much better.  CPAP auto 5-15 Download indicates 86% 4 hour compliance with AHI at 3.5. Pressures are comfortable. She is still getting use to keeping mask on and we discussed comfort issues. Sleep quality is getting better.  ROS-see HPI   Negative unless "+" Constitutional:    weight loss, night sweats, fevers, chills, fatigue, lassitude. HEENT:    headaches, difficulty swallowing, tooth/dental problems, sore throat,       sneezing, itching, ear ache, nasal congestion, post nasal drip, snoring CV:    chest pain, orthopnea, PND, swelling in lower extremities, anasarca,                                                    dizziness, palpitations Resp:   shortness of breath with exertion or at rest.                productive cough,   non-productive cough, coughing up of blood.              change in color of mucus.  wheezing.   Skin:    rash or lesions. GI:  No-   heartburn, indigestion, abdominal pain, nausea, vomiting, diarrhea,                 change in bowel habits, loss of  appetite GU: dysuria, change in color of urine, no urgency or frequency.  MS:   joint pain, stiffness, decreased range of motion, back pain. Neuro-     nothing unusual Psych:  change in mood or affect.  depression or anxiety.   memory loss.  OBJ- Physical Exam General- Alert, Oriented, Affect-appropriate, Distress- none acute, + Overweight Skin- rash-none, lesions- none, excoriation- none Lymphadenopathy- none Head- atraumatic            Eyes- Gross vision intact, PERRLA, conjunctivae and secretions clear            Ears- Hearing, canals-normal            Nose- Clear, no-Septal dev, mucus, polyps, erosion, perforation             Throat- Mallampati III , mucosa clear , drainage- none, tonsils- atrophic Neck- flexible , trachea midline, no stridor , thyroid nl, carotid no bruit Chest - symmetrical excursion , unlabored           Heart/CV- RRR , no murmur , no gallop  , no rub, nl s1 s2                           -  JVD- none , edema- none, stasis changes- none, varices- none           Lung- clear to P&A, wheeze- none, cough- none , dullness-none, rub- none           Chest wall-  Abd-  Br/ Gen/ Rectal- Not done, not indicated Extrem- cyanosis- none, clubbing, none, atrophy- none, strength- nl Neuro- grossly intact to observation

## 2015-11-08 DIAGNOSIS — G4733 Obstructive sleep apnea (adult) (pediatric): Secondary | ICD-10-CM | POA: Diagnosis not present

## 2015-11-12 DIAGNOSIS — N950 Postmenopausal bleeding: Secondary | ICD-10-CM | POA: Diagnosis not present

## 2015-12-02 MED FILL — FLECAINIDE ACETATE 50 MG TA: 50 | 90 days supply | Qty: 90 | Fill #2

## 2015-12-02 MED FILL — METOPROLOL TARTRATE 50 MG T: 50 | 90 days supply | Qty: 90 | Fill #2

## 2015-12-03 DIAGNOSIS — M170 Bilateral primary osteoarthritis of knee: Secondary | ICD-10-CM | POA: Diagnosis not present

## 2015-12-04 ENCOUNTER — Ambulatory Visit: Payer: 59 | Admitting: Internal Medicine

## 2015-12-05 ENCOUNTER — Encounter (HOSPITAL_COMMUNITY): Payer: Self-pay | Admitting: *Deleted

## 2015-12-05 ENCOUNTER — Inpatient Hospital Stay (HOSPITAL_COMMUNITY)
Admission: EM | Admit: 2015-12-05 | Discharge: 2015-12-06 | DRG: 064 | Disposition: A | Discharge status: Home / Self Care | Payer: 59 | Attending: Internal Medicine | Admitting: Internal Medicine

## 2015-12-05 ENCOUNTER — Emergency Department (HOSPITAL_COMMUNITY): Payer: 59

## 2015-12-05 DIAGNOSIS — Z79899 Other long term (current) drug therapy: Secondary | ICD-10-CM

## 2015-12-05 DIAGNOSIS — G9340 Encephalopathy, unspecified: Secondary | ICD-10-CM | POA: Diagnosis not present

## 2015-12-05 DIAGNOSIS — I6340 Cerebral infarction due to embolism of unspecified cerebral artery: Principal | ICD-10-CM | POA: Diagnosis present

## 2015-12-05 DIAGNOSIS — R29818 Other symptoms and signs involving the nervous system: Secondary | ICD-10-CM | POA: Diagnosis not present

## 2015-12-05 DIAGNOSIS — I081 Rheumatic disorders of both mitral and tricuspid valves: Secondary | ICD-10-CM | POA: Diagnosis present

## 2015-12-05 DIAGNOSIS — I6789 Other cerebrovascular disease: Secondary | ICD-10-CM | POA: Diagnosis not present

## 2015-12-05 DIAGNOSIS — Z833 Family history of diabetes mellitus: Secondary | ICD-10-CM

## 2015-12-05 DIAGNOSIS — Z8249 Family history of ischemic heart disease and other diseases of the circulatory system: Secondary | ICD-10-CM

## 2015-12-05 DIAGNOSIS — R410 Disorientation, unspecified: Secondary | ICD-10-CM | POA: Diagnosis present

## 2015-12-05 DIAGNOSIS — I10 Essential (primary) hypertension: Secondary | ICD-10-CM | POA: Diagnosis not present

## 2015-12-05 DIAGNOSIS — I6330 Cerebral infarction due to thrombosis of unspecified cerebral artery: Secondary | ICD-10-CM | POA: Insufficient documentation

## 2015-12-05 DIAGNOSIS — Z823 Family history of stroke: Secondary | ICD-10-CM | POA: Diagnosis not present

## 2015-12-05 DIAGNOSIS — G4733 Obstructive sleep apnea (adult) (pediatric): Secondary | ICD-10-CM | POA: Diagnosis present

## 2015-12-05 DIAGNOSIS — I471 Supraventricular tachycardia: Secondary | ICD-10-CM | POA: Diagnosis present

## 2015-12-05 DIAGNOSIS — I639 Cerebral infarction, unspecified: Secondary | ICD-10-CM | POA: Diagnosis present

## 2015-12-05 DIAGNOSIS — Z87891 Personal history of nicotine dependence: Secondary | ICD-10-CM

## 2015-12-05 DIAGNOSIS — R411 Anterograde amnesia: Secondary | ICD-10-CM

## 2015-12-05 DIAGNOSIS — Z7982 Long term (current) use of aspirin: Secondary | ICD-10-CM | POA: Diagnosis not present

## 2015-12-05 DIAGNOSIS — E785 Hyperlipidemia, unspecified: Secondary | ICD-10-CM | POA: Diagnosis present

## 2015-12-05 DIAGNOSIS — I63412 Cerebral infarction due to embolism of left middle cerebral artery: Secondary | ICD-10-CM | POA: Diagnosis not present

## 2015-12-05 DIAGNOSIS — I480 Paroxysmal atrial fibrillation: Secondary | ICD-10-CM | POA: Diagnosis present

## 2015-12-05 LAB — PROTIME-INR
INR: 0.88
Prothrombin Time: 11.9 seconds (ref 11.4–15.2)

## 2015-12-05 LAB — URINALYSIS, ROUTINE W REFLEX MICROSCOPIC
BILIRUBIN URINE: NEGATIVE
Glucose, UA: NEGATIVE mg/dL
KETONES UR: NEGATIVE mg/dL
Leukocytes, UA: NEGATIVE
NITRITE: NEGATIVE
PROTEIN: NEGATIVE mg/dL
Specific Gravity, Urine: 1.026 (ref 1.005–1.030)
pH: 6 (ref 5.0–8.0)

## 2015-12-05 LAB — DIFFERENTIAL
BASOS ABS: 0 10*3/uL (ref 0.0–0.1)
BASOS PCT: 0 %
Eosinophils Absolute: 0.2 10*3/uL (ref 0.0–0.7)
Eosinophils Relative: 2 %
LYMPHS PCT: 52 %
Lymphs Abs: 4 10*3/uL (ref 0.7–4.0)
MONO ABS: 0.8 10*3/uL (ref 0.1–1.0)
MONOS PCT: 11 %
NEUTROS ABS: 2.6 10*3/uL (ref 1.7–7.7)
Neutrophils Relative %: 35 %

## 2015-12-05 LAB — CBC
HEMATOCRIT: 40.1 % (ref 36.0–46.0)
Hemoglobin: 13.4 g/dL (ref 12.0–15.0)
MCH: 29.5 pg (ref 26.0–34.0)
MCHC: 33.4 g/dL (ref 30.0–36.0)
MCV: 88.3 fL (ref 78.0–100.0)
Platelets: 275 10*3/uL (ref 150–400)
RBC: 4.54 MIL/uL (ref 3.87–5.11)
RDW: 13.6 % (ref 11.5–15.5)
WBC: 7.6 10*3/uL (ref 4.0–10.5)

## 2015-12-05 LAB — I-STAT TROPONIN, ED: Troponin i, poc: 0.01 ng/mL (ref 0.00–0.08)

## 2015-12-05 LAB — I-STAT CHEM 8, ED
BUN: 24 mg/dL — AB (ref 6–20)
CALCIUM ION: 1.14 mmol/L — AB (ref 1.15–1.40)
CHLORIDE: 103 mmol/L (ref 101–111)
CREATININE: 0.8 mg/dL (ref 0.44–1.00)
GLUCOSE: 104 mg/dL — AB (ref 65–99)
HCT: 40 % (ref 36.0–46.0)
Hemoglobin: 13.6 g/dL (ref 12.0–15.0)
POTASSIUM: 4.3 mmol/L (ref 3.5–5.1)
Sodium: 140 mmol/L (ref 135–145)
TCO2: 30 mmol/L (ref 0–100)

## 2015-12-05 LAB — COMPREHENSIVE METABOLIC PANEL
ALK PHOS: 51 U/L (ref 38–126)
ALT: 42 U/L (ref 14–54)
AST: 34 U/L (ref 15–41)
Albumin: 4.3 g/dL (ref 3.5–5.0)
Anion gap: 7 (ref 5–15)
BUN: 20 mg/dL (ref 6–20)
CALCIUM: 9.2 mg/dL (ref 8.9–10.3)
CHLORIDE: 104 mmol/L (ref 101–111)
CO2: 26 mmol/L (ref 22–32)
CREATININE: 0.78 mg/dL (ref 0.44–1.00)
GFR calc non Af Amer: >60 mL/min (ref >60)
Glucose, Bld: 103 mg/dL — ABNORMAL HIGH (ref 65–99)
Potassium: 3.9 mmol/L (ref 3.5–5.1)
SODIUM: 137 mmol/L (ref 135–145)
Total Bilirubin: 0.6 mg/dL (ref 0.3–1.2)
Total Protein: 7.5 g/dL (ref 6.5–8.1)

## 2015-12-05 LAB — RAPID URINE DRUG SCREEN, HOSP PERFORMED
AMPHETAMINES: NOT DETECTED
BARBITURATES: NOT DETECTED
BENZODIAZEPINES: NOT DETECTED
Cocaine: NOT DETECTED
Opiates: NOT DETECTED
TETRAHYDROCANNABINOL: NOT DETECTED

## 2015-12-05 LAB — URINE MICROSCOPIC-ADD ON: WBC UA: NONE SEEN WBC/hpf (ref 0–5)

## 2015-12-05 LAB — ETHANOL: Alcohol, Ethyl (B): <5 mg/dL (ref <5)

## 2015-12-05 LAB — APTT: APTT: 28 s (ref 24–36)

## 2015-12-05 MED ORDER — STROKE: EARLY STAGES OF RECOVERY BOOK
Freq: Once | Status: AC
  Administered 2015-12-06
  Filled 2015-12-05: qty 1

## 2015-12-05 MED ORDER — SODIUM CHLORIDE 0.9 % IV SOLN
INTRAVENOUS | Status: DC
  Administered 2015-12-05: via INTRAVENOUS

## 2015-12-05 MED ORDER — ASPIRIN 300 MG RE SUPP: 300.0000 mg | Freq: Every day | RECTAL | Status: DC

## 2015-12-05 MED ORDER — ENOXAPARIN SODIUM 40 MG/0.4ML ~~LOC~~ SOLN
40.0000 mg | Freq: Every day | SUBCUTANEOUS | Status: DC
  Administered 2015-12-06: 40 mg via SUBCUTANEOUS
  Filled 2015-12-05: qty 0.4

## 2015-12-05 MED ORDER — ASPIRIN 325 MG PO TABS
325.0000 mg | ORAL_TABLET | Freq: Every day | ORAL | Status: DC
  Administered 2015-12-06: 325 mg via ORAL
  Filled 2015-12-05: qty 1

## 2015-12-05 MED ORDER — OMEGA-3-ACID ETHYL ESTERS 1 G PO CAPS
2000.0000 mg | ORAL_CAPSULE | Freq: Every day | ORAL | Status: DC
  Administered 2015-12-06: 2000 mg via ORAL
  Filled 2015-12-05: qty 2

## 2015-12-05 MED ORDER — ASPIRIN 81 MG PO CHEW
324.0000 mg | CHEWABLE_TABLET | Freq: Once | ORAL | Status: AC
  Administered 2015-12-05: 324 mg via ORAL
  Filled 2015-12-05: qty 4

## 2015-12-05 MED ORDER — ATORVASTATIN CALCIUM 80 MG PO TABS: 80.0000 mg | ORAL_TABLET | Freq: Every day | ORAL | Status: DC

## 2015-12-05 MED ORDER — METOPROLOL TARTRATE 25 MG PO TABS
50.0000 mg | ORAL_TABLET | Freq: Every day | ORAL | Status: DC
Start: 2015-12-06 — End: 2015-12-06
  Administered 2015-12-06: 50 mg via ORAL
  Filled 2015-12-05: qty 2

## 2015-12-05 MED ORDER — FLECAINIDE ACETATE 50 MG PO TABS
50.0000 mg | ORAL_TABLET | Freq: Every day | ORAL | Status: DC
  Administered 2015-12-06: 50 mg via ORAL
  Filled 2015-12-05: qty 1

## 2015-12-05 MED ORDER — STROKE: EARLY STAGES OF RECOVERY BOOK
Freq: Once | Status: AC
  Filled 2015-12-05: qty 1

## 2015-12-05 NOTE — ED Notes (Signed)
50 YO F with hx AFIB and HTN presents with memory loss, inappropriate behavior (laughing) and facial droop. LSW 1815. NIH 0, next neuro check due at 2130. Passed swallow screen.   [facility] RN [phone]

## 2015-12-05 NOTE — H&P (Signed)
History and Physical    Cindy Faulkner [id number] DOB: 02-03-1966 DOA: 12/05/2015  PCP: Lennie Odor PA-C  Patient coming from: [facility].  Chief Complaint: Confusion.  HPI: Cindy Faulkner is a 50 y.o. female with atrial tachycardia, hypertension, sleep apnea was noticed by patient's colleagues at work today that the patient was confused and talking inappropriately. Patient also was noticed to have periods of laughing. This was more pronounced around 6:20 PM and patient was taken to the ER and ER physician also in addition noticed that patient had mild left blurred vision. Patient was brought to the ER at [facility] as a code stroke. Neurologist Dr. Shon Hale evaluated the patient and since patient's NIH score was 0 TPA was not given. Patient's symptoms gradually resolved. Patient did not have any weakness of extremities or difficulty swallowing. Denies any chest pain shortness of breath. MRI brain done shows left internal capsule stroke and patient is being admitted for further management. Mild time I examined the patient patient has come back to baseline.  ED Course: MRI brain shows left internal capsule stroke and CT head was unremarkable. Patient passed swallow evaluation.  Review of Systems: As per HPI, rest all negative.   Past Medical History:  Diagnosis Date  . Atrial fibrillation ([place])    a. echo 4/07: EF 60%  . Atrial tachycardia ([place])    a. s/p EPS 4/09: no inducible SVT - med Tx continued  . Complication of anesthesia    age 18yo, woke during procedure with GA, paralysed   . Endometrial polyp   . Hypertension   . PONV (postoperative nausea and vomiting)   . Rectocele    WITHOUT MENTION OF UTERINE PROLAPSE  . SVD (spontaneous vaginal delivery)    x 1    Past Surgical History:  Procedure Laterality Date  . COLONOSCOPY    . HYSTEROSCOPY    . LEEP    . Pylonidal cystect    . TUBAL LIGATION    . WISDOM TOOTH EXTRACTION       reports that she  quit smoking about 17 years ago. Her smoking use included Cigarettes. She has a 5.00 pack-year smoking history. She has never used smokeless tobacco. She reports that she does not drink alcohol or use drugs.  No Known Allergies  Family History  Problem Relation Age of Onset  . Hypertension Father   . Diabetes Father   . Stroke Father   . Hypertension Mother   . Stroke Mother     Prior to Admission medications   Medication Sig Start Date End Date Taking? Authorizing Provider  ALPRAZolam Duanne Moron) 0.25 MG tablet Take 0.25 mg by mouth at bedtime as needed for sleep.    Yes Historical Provider, MD  aspirin 325 MG tablet Take 325 mg by mouth daily.     Yes Historical Provider, MD  Biotin 1000 MCG tablet Take 1,000 mcg by mouth daily.   Yes Historical Provider, MD  cholecalciferol (VITAMIN D) 1000 units tablet Take 2,000 Units by mouth daily.   Yes Historical Provider, MD  flecainide (TAMBOCOR) 50 MG tablet Take 1 tablet (50 mg total) by mouth daily. 07/24/15  Yes Deneise Lever, MD  furosemide (LASIX) 40 MG tablet Take 1 tablet (40 mg total) by mouth 2 (two) times daily. Patient taking differently: Take 40 mg by mouth daily.  10/03/15  Yes Evans Lance, MD  Glucosamine-Chondroitin (GLUCOSAMINE CHONDR COMPLEX PO) Take 2 capsules by mouth daily.    Yes  Historical Provider, MD  metoprolol (LOPRESSOR) 50 MG tablet TAKE ONE TABLET BY MOUTH ONCE DAILY 05/21/15  Yes Evans Lance, MD  Multiple Vitamin (MULTIVITAMIN) tablet Take 2 tablets by mouth daily.    Yes Historical Provider, MD  Omega-3 Fatty Acids (FISH OIL) 1000 MG CAPS Take 2 capsules by mouth daily.     Yes Historical Provider, MD  Turmeric (CURCUMIN 95) 500 MG CAPS Take 1 tablet by mouth daily.    Yes Historical Provider, MD    Physical Exam: Vitals:   12/05/15 2015 12/05/15 2045 12/05/15 2215 12/05/15 2300  BP: 161/77 153/77 176/79 172/81  Pulse:      Resp: 14 16 22 16   Temp:      SpO2:      Weight:      Height:           Constitutional: Moderately built and nourished. Vitals:   12/05/15 2015 12/05/15 2045 12/05/15 2215 12/05/15 2300  BP: 161/77 153/77 176/79 172/81  Pulse:      Resp: 14 16 22 16   Temp:      SpO2:      Weight:      Height:       Eyes: Anicteric. No pallor. ENMT: No discharge from the ears eyes nose or mouth. Neck: No mass felt. No neck rigidity. No carotid bruit. Respiratory: No rhonchi or crepitations. Cardiovascular: S1-S2 heard. No murmurs appreciated. Abdomen: Soft nontender bowel sounds present. Musculoskeletal: No edema. No joint effusion. Skin: No rash. Skin appears warm. Neurologic: Alert awake oriented to time place and person. Moves all extremities 5 x 5. No facial asymmetry tongue is midline, PERRLA positive. Psychiatric: Appears normal normal affect.   Labs on Admission: I have personally reviewed following labs and imaging studies  CBC:  Recent Labs Lab 12/05/15 1840 12/05/15 1852  WBC 7.6  --   NEUTROABS 2.6  --   HGB 13.4 13.6  HCT 40.1 40.0  MCV 88.3  --   PLT 275  --    Basic Metabolic Panel:  Recent Labs Lab 12/05/15 1840 12/05/15 1852  NA 137 140  K 3.9 4.3  CL 104 103  CO2 26  --   GLUCOSE 103* 104*  BUN 20 24*  CREATININE 0.78 0.80  CALCIUM 9.2  --    GFR: Estimated Creatinine Clearance: 107.8 mL/min (by C-G formula based on SCr of 0.8 mg/dL). Liver Function Tests:  Recent Labs Lab 12/05/15 1840  AST 34  ALT 42  ALKPHOS 51  BILITOT 0.6  PROT 7.5  ALBUMIN 4.3   No results for input(s): LIPASE, AMYLASE in the last 168 hours. No results for input(s): AMMONIA in the last 168 hours. Coagulation Profile:  Recent Labs Lab 12/05/15 1840  INR 0.88   Cardiac Enzymes: No results for input(s): CKTOTAL, CKMB, CKMBINDEX, TROPONINI in the last 168 hours. BNP (last 3 results) No results for input(s): PROBNP in the last 8760 hours. HbA1C: No results for input(s): HGBA1C in the last 72 hours. CBG: No results for input(s):  GLUCAP in the last 168 hours. Lipid Profile: No results for input(s): CHOL, HDL, LDLCALC, TRIG, CHOLHDL, LDLDIRECT in the last 72 hours. Thyroid Function Tests: No results for input(s): TSH, T4TOTAL, FREET4, T3FREE, THYROIDAB in the last 72 hours. Anemia Panel: No results for input(s): VITAMINB12, FOLATE, FERRITIN, TIBC, IRON, RETICCTPCT in the last 72 hours. Urine analysis:    Component Value Date/Time   COLORURINE YELLOW 12/05/2015 2033   APPEARANCEUR CLOUDY (A) 12/05/2015 2033  LABSPEC 1.026 12/05/2015 2033   PHURINE 6.0 12/05/2015 2033   GLUCOSEU NEGATIVE 12/05/2015 2033   HGBUR MODERATE (A) 12/05/2015 2033   BILIRUBINUR NEGATIVE 12/05/2015 2033   [place] 12/05/2015 2033   PROTEINUR NEGATIVE 12/05/2015 2033   UROBILINOGEN 0.2 11/27/2010 1122   NITRITE NEGATIVE 12/05/2015 2033   LEUKOCYTESUR NEGATIVE 12/05/2015 2033   Sepsis Labs: @LABRCNTIP (procalcitonin:4,lacticidven:4) )No results found for this or any previous visit (from the past 240 hour(s)).   Radiological Exams on Admission: Mr Brain Wo Contrast  Result Date: 12/05/2015 CLINICAL DATA:  Code stroke.  Altered mental status. EXAM: MRI HEAD WITHOUT CONTRAST MRA HEAD WITHOUT CONTRAST TECHNIQUE: Multiplanar, multiecho pulse sequences of the brain and surrounding structures were obtained without intravenous contrast. Angiographic images of the head were obtained using MRA technique without contrast. COMPARISON:  None. FINDINGS: MRI HEAD FINDINGS Brain: There is a small focus of diffusion restriction at the genu of the left internal capsule. The midline structures are normal. There is mild scattered hyperintense T2-weighted signal within the subcortical white matter, possibly indicating early chronic microvascular ischemia, although these findings may also be seen in normal patients of this age. No mass lesion or midline shift. No hydrocephalus or extra-axial fluid collection. Vascular: Major intracranial arterial and  venous sinus flow voids are preserved. No evidence of chronic microhemorrhage or amyloid angiopathy. Skull and upper cervical spine: The visualized skull base, calvarium, upper cervical spine and extracranial soft tissues are normal. Sinuses/Orbits: No fluid levels or advanced mucosal thickening. No mastoid effusion. Normal orbits. MRA HEAD FINDINGS Intracranial internal carotid arteries: Mild narrowing of the distal cervical left ICA. There is moderate stenosis of the left ICA terminus and mild narrowing of the proximal left MCA. The right ICA is normal. Anterior cerebral arteries: Absent left A1 segment, a congenital variant. Normal right ACA. Normal A2 segments bilaterally. Middle cerebral arteries: As above, mild narrowing of the proximal left MCA. Posterior communicating arteries: Present on the right. Absent on the left. Posterior cerebral arteries: Normal. Basilar artery: Normal. Vertebral arteries: Codominant. Normal. Superior cerebellar arteries: Normal. Anterior inferior cerebellar arteries: Not clearly visualized, which is not uncommon. Posterior inferior cerebellar arteries: Normal. IMPRESSION: 1. Small acute infarct of the genu of the left internal capsule. No associated hemorrhage or mass effect. 2. Mild-to-moderate narrowing of the left internal carotid artery clinoid segment and carotid terminus, with mild narrowing of the proximal right MCA. 3. Mild narrowing of the distal cervical left ICA. These results will be called to the ordering clinician or representative by the Radiologist Assistant, and communication documented in the PACS or zVision Dashboard. Electronically Signed   By: Ulyses Jarred M.D.   On: 12/05/2015 22:03   Mr Jodene Nam Headm  Result Date: 12/05/2015 CLINICAL DATA:  Code stroke.  Altered mental status. EXAM: MRI HEAD WITHOUT CONTRAST MRA HEAD WITHOUT CONTRAST TECHNIQUE: Multiplanar, multiecho pulse sequences of the brain and surrounding structures were obtained without intravenous  contrast. Angiographic images of the head were obtained using MRA technique without contrast. COMPARISON:  None. FINDINGS: MRI HEAD FINDINGS Brain: There is a small focus of diffusion restriction at the genu of the left internal capsule. The midline structures are normal. There is mild scattered hyperintense T2-weighted signal within the subcortical white matter, possibly indicating early chronic microvascular ischemia, although these findings may also be seen in normal patients of this age. No mass lesion or midline shift. No hydrocephalus or extra-axial fluid collection. Vascular: Major intracranial arterial and venous sinus flow voids are preserved. No evidence of  chronic microhemorrhage or amyloid angiopathy. Skull and upper cervical spine: The visualized skull base, calvarium, upper cervical spine and extracranial soft tissues are normal. Sinuses/Orbits: No fluid levels or advanced mucosal thickening. No mastoid effusion. Normal orbits. MRA HEAD FINDINGS Intracranial internal carotid arteries: Mild narrowing of the distal cervical left ICA. There is moderate stenosis of the left ICA terminus and mild narrowing of the proximal left MCA. The right ICA is normal. Anterior cerebral arteries: Absent left A1 segment, a congenital variant. Normal right ACA. Normal A2 segments bilaterally. Middle cerebral arteries: As above, mild narrowing of the proximal left MCA. Posterior communicating arteries: Present on the right. Absent on the left. Posterior cerebral arteries: Normal. Basilar artery: Normal. Vertebral arteries: Codominant. Normal. Superior cerebellar arteries: Normal. Anterior inferior cerebellar arteries: Not clearly visualized, which is not uncommon. Posterior inferior cerebellar arteries: Normal. IMPRESSION: 1. Small acute infarct of the genu of the left internal capsule. No associated hemorrhage or mass effect. 2. Mild-to-moderate narrowing of the left internal carotid artery clinoid segment and carotid  terminus, with mild narrowing of the proximal right MCA. 3. Mild narrowing of the distal cervical left ICA. These results will be called to the ordering clinician or representative by the Radiologist Assistant, and communication documented in the PACS or zVision Dashboard. Electronically Signed   By: Ulyses Jarred M.D.   On: 12/05/2015 22:03   Ct Head Code Stroke W/o Cm  Result Date: 12/05/2015 CLINICAL DATA:  Code stroke.  Sudden onset altered mental status. EXAM: CT HEAD WITHOUT CONTRAST TECHNIQUE: Contiguous axial images were obtained from the base of the skull through the vertex without intravenous contrast. COMPARISON:  None. FINDINGS: Brain: No mass lesion, intraparenchymal hemorrhage or extra-axial collection. No evidence of acute cortical infarct. Brain parenchyma and CSF-containing spaces are normal for age. Vascular: No hyperdense vessel or unexpected calcification. Skull: Normal visualized skull base, calvarium and extracranial soft tissues. Sinuses/Orbits: No sinus fluid levels or advanced mucosal thickening. No mastoid effusion. Normal orbits. ASPECTS [facility] Stroke Program Early CT Score) - Ganglionic level infarction (caudate, lentiform nuclei, internal capsule, insula, M1-M3 cortex): 7 - Supraganglionic infarction (M4-M6 cortex): 3 Total score (0-10 with 10 being normal): 10 IMPRESSION: 1. No acute intracranial abnormality. 2. ASPECTS is 10. These results were called by telephone at the time of interpretation on 12/05/2015 at 6:56 pm to Dr. Isla Pence , who verbally acknowledged these results. Electronically Signed   By: Ulyses Jarred M.D.   On: 12/05/2015 18:57    EKG: Independently reviewed. Normal sinus rhythm with atrial premature complexes.  Assessment/Plan Principal Problem:   Cerebral thrombosis with cerebral infarction Active Problems:   Essential hypertension   Atrial tachycardia (HCC)   Obstructive sleep apnea   Stroke (cerebrum) ([place])    1. Stroke involving the  left internal capsule - patient is back to baseline at this time. Appears weak but no focal deficits. Patient passed swallow. Discussed with Dr. Shon Hale, neurologist, patient will be placed on Lipitor 80 mg along with aspirin. Check hemoglobin A1c and lipid panel carried Doppler 2-D echo and closely monitored in telemetry for arrhythmias. Get physical therapy consult. 2. Hypertension - continue metoprolol and allow for permissive hypertension. 3. History of atrial tachycardia - continue flecainide and closely monitored in telemetry for arrhythmias. 4. Sleep apnea on CPAP.  Since patient has multiple comorbidities and generally appearing weak I think patient will require physical therapy consult and inpatient stay.   DVT prophylaxis: Lovenox. Code Status: Full code.  Family Communication: Patient's husband.  Disposition Plan: Home.  Consults called: Neurology.  Admission status: Inpatient. Likely stay 2 days.    Rise Patience MD [facility] Pager [phone].  If 7PM-7AM, please contact night-coverage [email or web address] Password [email or web address]  12/05/2015, 11:47 PM

## 2015-12-05 NOTE — ED Provider Notes (Signed)
[name] is a 50 year old female with past medical history significant for A. fib, hypertension, obstructive sleep apnea who presents for evaluation of possible stroke after transfer from [place] [facility].  She was having difficulty remembering things, word finding, and had new facial droop.  On arrival code stroke was initiated. CT  Head demonstrates no acute intracranial abnormalities. MRI Brain shows acute infarct of left internal capsule, mild to moderate narrowing of left internal carotid.  Stroke labs ordered, unremarkable.  Admitted to hospitalist with neurology following.   Elveria Rising, MD 12/06/15 [place] Liu, MD 12/06/15 1440

## 2015-12-05 NOTE — ED Provider Notes (Signed)
I saw and evaluated the patient, reviewed the resident's note and I agree with the findings and plan.   EKG Interpretation  Date/Time:  Friday December 05 2015 18:32:49 EDT Ventricular Rate:  63 PR Interval:    QRS Duration: 100 QT Interval:  419 QTC Calculation: 429 R Axis:   52 Text Interpretation:  Sinus rhythm Atrial premature complex Confirmed by HAVILAND MD, JULIE ([id number]) on 12/05/2015 7:00:01 PM       CRITICAL CARE Performed by: Forde Dandy   Total critical care time: 35 minutes  Critical care time was exclusive of separately billable procedures and treating other patients.  Critical care was necessary to treat or prevent imminent or life-threatening deterioration.  Critical care was time spent personally by me on the following activities: development of treatment plan with patient and/or surrogate as well as nursing, discussions with consultants, evaluation of patient's response to treatment, examination of patient, obtaining history from patient or surrogate, ordering and performing treatments and interventions, ordering and review of laboratory studies, ordering and review of radiographic studies, pulse oximetry and re-evaluation of patient's condition.   50 year old Female who presents as code stroke. History of atrial fibrillation and HTN. 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 The patient suddenly developed expressive aphasia, difficulty remembering things appropriately, and with left facial droop that was noticed by a coworker 30 minutes prior to initial ED evaluation at [facility] at about 6:20PM. Code stroke was activated there, and she initially had a negative CT head. ED provider at [facility] have spoken with Dr. Leonel Ramsay from neurology who recommended transfer to [facility] for stroke evaluation. She appeared to have some resolving deficits during this time. Spoke with Dr. Shon Hale on patient arrival. Patient with some memory deficit and inappropriate speech but no facial  droop. Remainder of her neurological exam is intact. Dr. Shon Hale requesting admission to hospitalist service for TIA workup vs other neuro process and MRI. Given resolving deficits did not think tpa warranted.  Admitted to hospitalist service.   Forde Dandy, MD 12/05/15 [phone]

## 2015-12-05 NOTE — Consult Note (Signed)
Neurology Consult Note  Reason for Consultation: CODE STROKE  Requesting provider: Isla Pence, MD  CC: "I can't remember"  HPI: This is a 50 year old right-handed woman who is transferred from [facility] emergency Department as a code stroke. History is obtained from the patient though she does have some difficulty recalling events of this evening. History is therefore supplemented by chart review and discussion with the transferring medics.  The patient is an ICU nurse at [employer]. She was at work today when she suddenly developed difficulty with her memory. The patient remembers being at work and working on the nursing assignments for the ICU tonight. After this, however, she has very patchy recollection of subsequent events. She remembers at one point trying to tell a family where their loved one had been transferred and having difficulty remembering and relating which room. At about 1820 her colleagues in the ICU noticed that she was having difficulty with her memory. They also reported that she was laughing inappropriately and noticed a left facial droop. She was taken to the emergency department at [facility] for evaluation. On her exam, the ED physician noted that she was slow to respond but able to answer questions correctly. It was also noted that she was moving all 4 extremities well with "a little blurry vision in her left eye." CODE STROKE was activated and the patient was taken for an emergent CT scan of the head which was interpreted as showing no acute abnormalities. She was then transferred to [facility] emergency department for neurologic evaluation as a CODE STROKE.   At this time, the patient reports that she feels better. She still notes that she is having some difficulty with her memory. She also reports a "pinching" sensation in the left cheek. She denies headache, double vision, blurred vision, vision loss, trouble swallowing, trouble talking, weakness, and  numbness. She continues to have limited recollection of the events of the afternoon. She has no prior history of stroke or transient neurologic deficits.  PMH: Reviewed with patient, as follows: Past Medical History:  Diagnosis Date  . Atrial fibrillation ([place])    a. echo 4/07: EF 60%  . Atrial tachycardia ([place])    a. s/p EPS 4/09: no inducible SVT - med Tx continued  . Complication of anesthesia    age 80yo, woke during procedure with GA, paralysed   . Endometrial polyp   . Hypertension   . PONV (postoperative nausea and vomiting)   . Rectocele    WITHOUT MENTION OF UTERINE PROLAPSE  . SVD (spontaneous vaginal delivery)    x 1    PSH:  Past Surgical History:  Procedure Laterality Date  . COLONOSCOPY    . HYSTEROSCOPY    . LEEP    . Pylonidal cystect    . TUBAL LIGATION    . WISDOM TOOTH EXTRACTION      Family history: Reviewed with patient, as follows: Family History  Problem Relation Age of Onset  . Hypertension Father   . Diabetes Father   . Stroke Father   . Hypertension Mother   . Stroke Mother   Of note, her mother stroke occurred at the age of 56.  Social history: Reviewed with patient, as noted: Social History   Social History  . Marital status: Married    Spouse name: N/A  . Number of children: N/A  . Years of education: N/A   Occupational History  . Not on file.   Social History Main Topics  .  Smoking status: Former Smoker    Packs/day: 0.50    Years: 10.00    Types: Cigarettes    Quit date: 02/22/1998  . Smokeless tobacco: Never Used  . Alcohol use No  . Drug use: No  . Sexual activity: Not on file   Other Topics Concern  . Not on file   Social History Narrative  . No narrative on file    Current inpatient meds:  Current Facility-Administered Medications  Medication Dose Route Frequency Provider Last Rate Last Dose  .  stroke: mapping our early stages of recovery book   Does not apply Once Darrel Reach, MD      . aspirin  chewable tablet 324 mg  324 mg Oral Once Isla Pence, MD      . aspirin suppository 300 mg  300 mg Rectal Daily Darrel Reach, MD       Or  . aspirin tablet 325 mg  325 mg Oral Daily Darrel Reach, MD       Current Outpatient Prescriptions  Medication Sig Dispense Refill  . ALPRAZolam (XANAX) 0.25 MG tablet Take 0.25 mg by mouth at bedtime as needed for sleep.     Marland Kitchen aspirin 325 MG tablet Take 325 mg by mouth daily.      . Biotin 1000 MCG tablet Take 1,000 mcg by mouth daily.    . cholecalciferol (VITAMIN D) 1000 units tablet Take 2,000 Units by mouth daily.    . flecainide (TAMBOCOR) 50 MG tablet Take 1 tablet (50 mg total) by mouth daily. 90 tablet 3  . furosemide (LASIX) 40 MG tablet Take 1 tablet (40 mg total) by mouth 2 (two) times daily. (Patient taking differently: Take 40 mg by mouth daily. ) 180 tablet 3  . Glucosamine-Chondroitin (GLUCOSAMINE CHONDR COMPLEX PO) Take 1 capsule by mouth daily.     . metoprolol (LOPRESSOR) 50 MG tablet TAKE ONE TABLET BY MOUTH ONCE DAILY 90 tablet 3  . Multiple Vitamin (MULTIVITAMIN) tablet Take 1 tablet by mouth daily.      . Omega-3 Fatty Acids (FISH OIL) 1000 MG CAPS Take 2 capsules by mouth daily.      . Turmeric (CURCUMIN 95) 500 MG CAPS Take by mouth.    . valACYclovir (VALTREX) 1000 MG tablet Take 1,000 mg by mouth 2 (two) times daily as needed. Reported on 07/24/2015      Allergies: No Known Allergies  ROS: As per HPI. A full 14-point review of systems was performed and is otherwise unremarkable.   PE:  BP 167/100 (BP Location: Left Arm)   Pulse 65   Temp 98.7 F (37.1 C)   Resp 13   Ht 5\' 7"  (1.702 m)   Wt 115.2 kg (254 lb)   SpO2 100%   BMI 39.78 kg/m   General: WDWN, no acute distress. AAO x4. Speech clear, no dysarthria. No aphasia. Follows commands briskly. She is able to spell "world" backwards without difficulty. She made two errors with serial 7s. Spontaneous recall is 0/3 at 3 minutes and got 2/3 with cues.  Spontaneous recall was 2/3 at 10 minutes and she got the third with a cue. Affect is bright with congruent mood. Comportment is normal.  HEENT: Normocephalic. Neck supple without LAD. MMM, OP clear. Dentition good. Sclerae anicteric. No conjunctival injection.  CV: Regular, no murmur. Carotid pulses full and symmetric, no bruits. Distal pulses 2+ and symmetric.  Lungs: CTAB.  Abdomen: Soft, non-distended, non-tender. Bowel sounds present x4.  Extremities: No  C/C/E. Neuro:  CN: Pupils are equal and round. They are symmetrically reactive from 3-->2 mm. EOMI without nystagmus. No reported diplopia. Facial sensation is intact to light touch. Face is symmetric at rest with normal strength and mobility. Hearing is intact to conversational voice. Palate elevates symmetrically and uvula is midline. Voice is normal in tone, pitch and quality. Bilateral SCM and trapezii are 5/5. Tongue is midline with normal bulk and mobility.  Motor: Normal bulk, tone, and strength. No tremor or other abnormal movements. No drift.  Sensation: Intact to light touch, pinprick.Vibration is mildly reduced at both great toes. Joint position is moderately reduced at both great toes.  DTRs: 2+, symmetric. Toes downgoing bilaterally. No pathologic reflexes.  Coordination: Finger-to-nose and heel-to-shin are without dysmetria. Finger taps are normal in amplitude and speed, no decrement.    Labs:  Lab Results  Component Value Date   WBC 7.6 12/05/2015   HGB 13.6 12/05/2015   HCT 40.0 12/05/2015   PLT 275 12/05/2015   GLUCOSE 104 (H) 12/05/2015   ALT 42 12/05/2015   AST 34 12/05/2015   NA 140 12/05/2015   K 4.3 12/05/2015   CL 103 12/05/2015   CREATININE 0.80 12/05/2015   BUN 24 (H) 12/05/2015   CO2 26 12/05/2015   TSH 0.59 06/24/2010   INR 0.88 12/05/2015   Troponin 0.01 Serum ethanol less than 5 mg/dL Urine drug screen pending Urinalysis pending  Imaging:  I have personally and independently reviewed the CT scan  of the head without contrast from today. There appears to be some subtle asymmetric hypodensity involving the right thalamus and hypothalamus, best seen on axial images 12-15. There appears to be some mild focal atrophy in the cerebellum.  Assessment and Plan:  1. Acute encephalopathy: Presentation consisted of some inappropriate laughter with impaired memory and possible left facial droop. On examination, she continues to have some problems with memory (both encoding and retrieval) and sustained attention. Memory is most affected. CT scan appears to show some subtle hypodensity in the right thalamus and hypothalamus which could explain these symptoms, but this could also represent artifact on the scan. The most likely consideration this case would be acute ischemia with other less likely considerations including seizure or occult mass lesion. Recommend MRI scan of the brain without contrast to further characterize. I will proceed with ischemic evaluation including MRI brain, MRA head, carotid Dopplers, fasting lipids, and hemoglobin A1c. She is taking aspirin 325 mg at home and this can be continued. Recommendations will depend upon results of the MRI scan. For now, allow permissive hypertension, treating only systolic blood pressures greater than [id number] or diastolic blood pressures greater than 110. Avoid sedating medications as much as possible.  This was discussed with the patient, her husband, and her daughter. They are in agreement with the plan as noted. They were given the opportunity to ask questions and these were addressed to their satisfaction.  My findings and impression were also discussed with the ED attending, Dr. Oleta Mouse, at the time of my consultation.

## 2015-12-05 NOTE — ED Triage Notes (Signed)
Pt at work in ICU developed facial droop, could not remember things that happened, when asked questions pt would just laugh, brought immediately to ED

## 2015-12-05 NOTE — ED Provider Notes (Signed)
[place] DEPT Provider Note   CSN: [id number] Arrival date & time: 12/05/15  1832     History   Chief Complaint Chief Complaint  Patient presents with  . Facial Droop    HPI Cindy Faulkner is a 50 y.o. female.  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  Her co-worker said that she noticed that pt had a facial droop and was not remembering things appropriately.  She could not remember where she was or who the president was.  Sx have improved now.      Past Medical History:  Diagnosis Date  . Atrial fibrillation ([place])    a. echo 4/07: EF 60%  . Atrial tachycardia ([place])    a. s/p EPS 4/09: no inducible SVT - med Tx continued  . Complication of anesthesia    age 52yo, woke during procedure with GA, paralysed   . Endometrial polyp   . Hypertension   . PONV (postoperative nausea and vomiting)   . Rectocele    WITHOUT MENTION OF UTERINE PROLAPSE  . SVD (spontaneous vaginal delivery)    x 1    Patient Active Problem List   Diagnosis Date Noted  . Obstructive sleep apnea 07/24/2015  . Atrial tachycardia ([place]) 03/22/2014  . Morbid obesity ([place]) 03/22/2014  . Abscess of axilla, right 10/09/2013  . Preoperative evaluation to rule out surgical contraindication 10/04/2011  . Chest pain, unspecified 06/24/2010  . Shortness of breath 06/24/2010  . Essential hypertension 10/20/2008  . RECTOCELE WITHOUT MENTION OF UTERINE PROLAPSE 10/20/2008  . ENDOMETRIAL POLYP 10/20/2008  . AF (paroxysmal atrial fibrillation) ([place]) 10/20/2008    Past Surgical History:  Procedure Laterality Date  . COLONOSCOPY    . HYSTEROSCOPY    . LEEP    . Pylonidal cystect    . TUBAL LIGATION    . WISDOM TOOTH EXTRACTION      OB History    No data available       Home Medications    Prior to Admission medications   Medication Sig Start Date End Date Taking? Authorizing Provider  ALPRAZolam Duanne Moron) 0.25 MG tablet Take 0.25 mg by mouth at bedtime as needed for sleep.     Historical  Provider, MD  aspirin 325 MG tablet Take 325 mg by mouth daily.      Historical Provider, MD  Biotin 1000 MCG tablet Take 1,000 mcg by mouth daily.    Historical Provider, MD  cholecalciferol (VITAMIN D) 1000 units tablet Take 2,000 Units by mouth daily.    Historical Provider, MD  flecainide (TAMBOCOR) 50 MG tablet Take 1 tablet (50 mg total) by mouth daily. 07/24/15   Deneise Lever, MD  furosemide (LASIX) 40 MG tablet Take 1 tablet (40 mg total) by mouth 2 (two) times daily. Patient taking differently: Take 40 mg by mouth daily.  10/03/15   Evans Lance, MD  Glucosamine-Chondroitin (GLUCOSAMINE CHONDR COMPLEX PO) Take 1 capsule by mouth daily.     Historical Provider, MD  metoprolol (LOPRESSOR) 50 MG tablet TAKE ONE TABLET BY MOUTH ONCE DAILY 05/21/15   Evans Lance, MD  Multiple Vitamin (MULTIVITAMIN) tablet Take 1 tablet by mouth daily.      Historical Provider, MD  Omega-3 Fatty Acids (FISH OIL) 1000 MG CAPS Take 2 capsules by mouth daily.      Historical Provider, MD  Turmeric (CURCUMIN 95) 500 MG CAPS Take by mouth.    Historical Provider, MD  valACYclovir (VALTREX) 1000 MG tablet Take  1,000 mg by mouth 2 (two) times daily as needed. Reported on 07/24/2015    Historical Provider, MD    Family History Family History  Problem Relation Age of Onset  . Hypertension Father   . Diabetes Father   . Stroke Father   . Hypertension Mother   . Stroke Mother     Social History Social History  Substance Use Topics  . Smoking status: Former Smoker    Packs/day: 0.50    Years: 10.00    Types: Cigarettes    Quit date: 02/22/1998  . Smokeless tobacco: Never Used  . Alcohol use No     Allergies   Review of patient's allergies indicates no known allergies.   Review of Systems Review of Systems  Neurological: Positive for facial asymmetry and speech difficulty.  All other systems reviewed and are negative.    Physical Exam Updated Vital Signs BP 171/73 (BP Location: Left Arm)    Pulse 72   Temp 98.7 F (37.1 C)   Resp 16   Ht 5\' 7"  (1.702 m)   Wt 254 lb (115.2 kg)   SpO2 98%   BMI 39.78 kg/m   Physical Exam  Constitutional: She appears well-developed and well-nourished.  HENT:  Head: Normocephalic and atraumatic.  Right Ear: External ear normal.  Left Ear: External ear normal.  Nose: Nose normal.  Mouth/Throat: Oropharynx is clear and moist.  Eyes: Conjunctivae and EOM are normal. Pupils are equal, round, and reactive to light.  Neck: Normal range of motion. Neck supple.  Cardiovascular: Normal rate, regular rhythm, normal heart sounds and intact distal pulses.   Pulmonary/Chest: Effort normal and breath sounds normal.  Abdominal: Soft. Bowel sounds are normal.  Musculoskeletal: Normal range of motion.  Neurological: She is alert.  Pt is slow to respond, but does answer questions correctly now.  She is moving all 4 extremities well.  She does have a little blurry vision in her left eye.  Skin: Skin is warm.  Psychiatric: She has a normal mood and affect. Her behavior is normal. Judgment and thought content normal.  Nursing note and vitals reviewed.    ED Treatments / Results  Labs (all labs ordered are listed, but only abnormal results are displayed) Labs Reviewed  I-STAT CHEM 8, ED - Abnormal; Notable for the following:       Result Value   BUN 24 (*)    Glucose, Bld 104 (*)    Calcium, Ion 1.14 (*)    All other components within normal limits  CBC  DIFFERENTIAL  ETHANOL  PROTIME-INR  APTT  COMPREHENSIVE METABOLIC PANEL  RAPID URINE DRUG SCREEN, HOSP PERFORMED  URINALYSIS, ROUTINE W REFLEX MICROSCOPIC (NOT AT [facility])  Randolm Idol, ED    EKG  EKG Interpretation  Date/Time:  Friday December 05 2015 18:32:49 EDT Ventricular Rate:  63 PR Interval:    QRS Duration: 100 QT Interval:  419 QTC Calculation: 429 R Axis:   52 Text Interpretation:  Sinus rhythm Atrial premature complex Confirmed by Izaah Westman MD, Tyris Eliot ([id number]) on  12/05/2015 7:00:01 PM       Radiology Ct Head Code Stroke W/o Cm  Result Date: 12/05/2015 CLINICAL DATA:  Code stroke.  Sudden onset altered mental status. EXAM: CT HEAD WITHOUT CONTRAST TECHNIQUE: Contiguous axial images were obtained from the base of the skull through the vertex without intravenous contrast. COMPARISON:  None. FINDINGS: Brain: No mass lesion, intraparenchymal hemorrhage or extra-axial collection. No evidence of acute cortical infarct. Brain parenchyma and CSF-containing  spaces are normal for age. Vascular: No hyperdense vessel or unexpected calcification. Skull: Normal visualized skull base, calvarium and extracranial soft tissues. Sinuses/Orbits: No sinus fluid levels or advanced mucosal thickening. No mastoid effusion. Normal orbits. ASPECTS [facility] Stroke Program Early CT Score) - Ganglionic level infarction (caudate, lentiform nuclei, internal capsule, insula, M1-M3 cortex): 7 - Supraganglionic infarction (M4-M6 cortex): 3 Total score (0-10 with 10 being normal): 10 IMPRESSION: 1. No acute intracranial abnormality. 2. ASPECTS is 10. These results were called by telephone at the time of interpretation on 12/05/2015 at 6:56 pm to Dr. Isla Pence , who verbally acknowledged these results. Electronically Signed   By: Ulyses Jarred M.D.   On: 12/05/2015 18:57    Procedures Procedures (including critical care time)  Medications Ordered in ED Medications - No data to display   Initial Impression / Assessment and Plan / ED Course  I have reviewed the triage vital signs and the nursing notes.  Pertinent labs & imaging results that were available during my care of the patient were reviewed by me and considered in my medical decision making (see chart for details).  Clinical Course   Code stroke was called due to initial sx.  Pt d/w Dr. Leonel Ramsay (neurology) who recommended pt be transferred to [facility] for a neurologist to evaluate.  Pt d/w Dr. Oleta Mouse in the ED who will accept pt  for transfer.    Final Clinical Impressions(s) / ED Diagnoses   Final diagnoses:  Cerebrovascular accident (CVA), unspecified mechanism ([place])    New Prescriptions New Prescriptions   No medications on file     Isla Pence, MD 12/06/15 Curly Rim

## 2015-12-05 NOTE — ED Notes (Signed)
Received report from [facility], neuro MD at bedside

## 2015-12-06 ENCOUNTER — Inpatient Hospital Stay (HOSPITAL_COMMUNITY): Payer: 59

## 2015-12-06 DIAGNOSIS — I63412 Cerebral infarction due to embolism of left middle cerebral artery: Secondary | ICD-10-CM

## 2015-12-06 DIAGNOSIS — I480 Paroxysmal atrial fibrillation: Secondary | ICD-10-CM

## 2015-12-06 DIAGNOSIS — I6789 Other cerebrovascular disease: Secondary | ICD-10-CM

## 2015-12-06 LAB — CBC
HEMATOCRIT: 37.8 % (ref 36.0–46.0)
HEMOGLOBIN: 12.5 g/dL (ref 12.0–15.0)
MCH: 28.9 pg (ref 26.0–34.0)
MCHC: 33.1 g/dL (ref 30.0–36.0)
MCV: 87.5 fL (ref 78.0–100.0)
Platelets: 198 10*3/uL (ref 150–400)
RBC: 4.32 MIL/uL (ref 3.87–5.11)
RDW: 13.2 % (ref 11.5–15.5)
WBC: 6.3 10*3/uL (ref 4.0–10.5)

## 2015-12-06 LAB — COMPREHENSIVE METABOLIC PANEL
ALBUMIN: 3.4 g/dL — AB (ref 3.5–5.0)
ALK PHOS: 37 U/L — AB (ref 38–126)
ALT: 34 U/L (ref 14–54)
ANION GAP: 6 (ref 5–15)
AST: 29 U/L (ref 15–41)
BILIRUBIN TOTAL: 0.5 mg/dL (ref 0.3–1.2)
BUN: 14 mg/dL (ref 6–20)
CALCIUM: 8.7 mg/dL — AB (ref 8.9–10.3)
CO2: 26 mmol/L (ref 22–32)
Chloride: 104 mmol/L (ref 101–111)
Creatinine, Ser: 0.73 mg/dL (ref 0.44–1.00)
GFR calc Af Amer: >60 mL/min (ref >60)
GFR calc non Af Amer: >60 mL/min (ref >60)
GLUCOSE: 110 mg/dL — AB (ref 65–99)
Potassium: 3.8 mmol/L (ref 3.5–5.1)
SODIUM: 136 mmol/L (ref 135–145)
Total Protein: 6 g/dL — ABNORMAL LOW (ref 6.5–8.1)

## 2015-12-06 LAB — LIPID PANEL
CHOL/HDL RATIO: 5.3 ratio
CHOLESTEROL: 224 mg/dL — AB (ref 0–200)
HDL: 42 mg/dL (ref >40)
LDL CALC: 159 mg/dL — AB (ref 0–99)
TRIGLYCERIDES: 116 mg/dL (ref <150)
VLDL: 23 mg/dL (ref 0–40)

## 2015-12-06 LAB — ECHOCARDIOGRAM COMPLETE
HEIGHTINCHES: 67 in
Weight: 4064 oz

## 2015-12-06 MED ORDER — ATORVASTATIN CALCIUM 40 MG PO TABS
40.0000 mg | ORAL_TABLET | Freq: Every day | ORAL | Status: DC
  Administered 2015-12-06: 40 mg via ORAL
  Filled 2015-12-06: qty 1

## 2015-12-06 MED ORDER — RIVAROXABAN 20 MG PO TABS
20.0000 mg | ORAL_TABLET | Freq: Every day | ORAL | Status: DC
  Administered 2015-12-06: 20 mg via ORAL
  Filled 2015-12-06: qty 1

## 2015-12-06 MED ORDER — ACETAMINOPHEN 325 MG PO TABS
650.0000 mg | ORAL_TABLET | Freq: Once | ORAL | Status: AC
  Administered 2015-12-06: 650 mg via ORAL

## 2015-12-06 MED ORDER — ATORVASTATIN CALCIUM 40 MG PO TABS: 40.0000 mg | ORAL_TABLET | Freq: Every day | ORAL | 0 refills | Status: DC

## 2015-12-06 MED ORDER — RIVAROXABAN (XARELTO) EDUCATION KIT FOR AFIB PATIENTS
PACK | Freq: Once | Status: AC
  Administered 2015-12-06: 17:00:00
  Filled 2015-12-06: qty 1

## 2015-12-06 MED ORDER — RIVAROXABAN 20 MG PO TABS: 20.0000 mg | ORAL_TABLET | Freq: Every day | ORAL | 0 refills | Status: DC

## 2015-12-06 MED ORDER — RIVAROXABAN (XARELTO) EDUCATION KIT FOR AFIB PATIENTS: 1.0000 | PACK | Freq: Once | 0 refills | Status: AC

## 2015-12-06 MED ORDER — INFLUENZA VAC SPLIT QUAD 0.5 ML IM SUSY: 0.5000 mL | PREFILLED_SYRINGE | INTRAMUSCULAR | Status: DC

## 2015-12-06 NOTE — Progress Notes (Signed)
VASCULAR LAB PRELIMINARY  PRELIMINARY  PRELIMINARY  PRELIMINARY  Carotid duplex completed.    Preliminary report:  1-39% ICA plaquing.  Vertebral artery flow is antegrade.  Cindy Faulkner, RVT 12/06/2015, 5:06 PM

## 2015-12-06 NOTE — Progress Notes (Signed)
STROKE TEAM PROGRESS NOTE   HISTORY OF PRESENT ILLNESS (per record) This is a 50 year old right-handed woman who is transferred from [facility] emergency Department as a code stroke. History is obtained from the patient though she does have some difficulty recalling events of this evening. History is therefore supplemented by chart review and discussion with the transferring medics.  The patient is an ICU nurse at [employer]. She was at work today when she suddenly developed difficulty with her memory. The patient remembers being at work and working on the nursing assignments for the ICU tonight. After this, however, she has very patchy recollection of subsequent events. She remembers at one point trying to tell a family where their loved one had been transferred and having difficulty remembering and relating which room. At about 1820 her colleagues in the ICU noticed that she was having difficulty with her memory. They also reported that she was laughing inappropriately and noticed a left facial droop. She was taken to the emergency department at [facility] for evaluation. On her exam, the ED physician noted that she was slow to respond but able to answer questions correctly. It was also noted that she was moving all 4 extremities well with "a little blurry vision in her left eye." CODE STROKE was activated and the patient was taken for an emergent CT scan of the head which was interpreted as showing no acute abnormalities. She was then transferred to [facility] emergency department for neurologic evaluation as a CODE STROKE.   At this time, the patient reports that she feels better. She still notes that she is having some difficulty with her memory. She also reports a "pinching" sensation in the left cheek. She denies headache, double vision, blurred vision, vision loss, trouble swallowing, trouble talking, weakness, and numbness. She continues to have limited recollection of the events of the  afternoon. She has no prior history of stroke or transient neurologic deficits.   SUBJECTIVE (INTERVAL HISTORY) Her mom and daughters are at the bedside.  Overall she feels her condition is completely resolved. She is ICU nurse and follows with Dr. Lovena Le for PAF and atrial tachycardia. Currently on ASA PTA. She felt her speech is at her baseline now.    OBJECTIVE Temp:  [97.5 F (36.4 C)-98.7 F (37.1 C)] 98.4 F (36.9 C) (10/14 1352) Pulse Rate:  [51-79] 54 (10/14 1352) Cardiac Rhythm: Normal sinus rhythm (10/14 0740) Resp:  [13-22] 20 (10/14 1352) BP: (153-186)/(67-100) 181/73 (10/14 1352) SpO2:  [96 %-100 %] 97 % (10/14 1352) Weight:  [115.2 kg (254 lb)] 115.2 kg (254 lb) (10/13 1832)  CBC:   Recent Labs Lab 12/05/15 1840 12/05/15 1852 12/06/15 0226  WBC 7.6  --  6.3  NEUTROABS 2.6  --   --   HGB 13.4 13.6 12.5  HCT 40.1 40.0 37.8  MCV 88.3  --  87.5  PLT 275  --  99991111    Basic Metabolic Panel:   Recent Labs Lab 12/05/15 1840 12/05/15 1852 12/06/15 0226  NA 137 140 136  K 3.9 4.3 3.8  CL 104 103 104  CO2 26  --  26  GLUCOSE 103* 104* 110*  BUN 20 24* 14  CREATININE 0.78 0.80 0.73  CALCIUM 9.2  --  8.7*    Lipid Panel:     Component Value Date/Time   CHOL 224 (H) 12/06/2015 0226   TRIG 116 12/06/2015 0226   HDL 42 12/06/2015 0226   CHOLHDL 5.3 12/06/2015 0226  VLDL 23 12/06/2015 0226   LDLCALC 159 (H) 12/06/2015 0226   HgbA1c: No results found for: HGBA1C Urine Drug Screen:     Component Value Date/Time   LABOPIA NONE DETECTED 12/05/2015 2033   COCAINSCRNUR NONE DETECTED 12/05/2015 2033   LABBENZ NONE DETECTED 12/05/2015 2033   AMPHETMU NONE DETECTED 12/05/2015 2033   THCU NONE DETECTED 12/05/2015 2033   LABBARB NONE DETECTED 12/05/2015 2033      IMAGING I have personally reviewed the radiological images below and agree with the radiology interpretations.  Mri and Mra Head 12/05/2015 1. Small acute infarct of the genu of the left  internal capsule. No associated hemorrhage or mass effect.  2. Mild-to-moderate narrowing of the left internal carotid artery clinoid segment and carotid terminus, with mild narrowing of the proximal right MCA.  3. Mild narrowing of the distal cervical left ICA.   Ct Head Code Stroke W/o Cm 12/05/2015 1. No acute intracranial abnormality.  2. ASPECTS is 10.   CUS pending  TTE pending   PHYSICAL EXAM  Temp:  [97.5 F (36.4 C)-99 F (37.2 C)] 99 F (37.2 C) (10/14 1500) Pulse Rate:  [51-79] 77 (10/14 1500) Resp:  [13-22] 20 (10/14 1352) BP: (153-186)/(66-100) 167/66 (10/14 1500) SpO2:  [96 %-100 %] 97 % (10/14 1500) Weight:  [254 lb (115.2 kg)] 254 lb (115.2 kg) (10/13 1832)  General - Well nourished, well developed, in no apparent distress.  Ophthalmologic - Sharp disc margins OU.   Cardiovascular - Regular rate and rhythm.  Mental Status -  Level of arousal and orientation to time, place, and person were intact. Language including expression, naming, repetition, comprehension was assessed and found intact. Occasional word finding difficulty. Fund of Knowledge was assessed and was intact.  Cranial Nerves II - XII - II - Visual field intact OU. III, IV, VI - Extraocular movements intact. V - Facial sensation intact bilaterally. VII - Facial movement intact bilaterally. VIII - Hearing & vestibular intact bilaterally. X - Palate elevates symmetrically. XI - Chin turning & shoulder shrug intact bilaterally. XII - Tongue protrusion intact.  Motor Strength - The patient's strength was normal in all extremities and pronator drift was absent.  Bulk was normal and fasciculations were absent.   Motor Tone - Muscle tone was assessed at the neck and appendages and was normal.  Reflexes - The patient's reflexes were 1+ in all extremities and she had no pathological reflexes.  Sensory - Light touch, temperature/pinprick were assessed and were symmetrical.    Coordination - The  patient had normal movements in the hands and feet with no ataxia or dysmetria.  Tremor was absent.  Gait and Station - deferred as she is in lunch.   ASSESSMENT/PLAN Ms. ARTINA SOSNA is a 50 y.o. female with history of paroxysmal atrial fibrillation, and hypertension presenting with memory problems, left facial droop, blurred vision in her left eye, and an abnormal sensation in her left cheek.  She did not receive IV t-PA due to mild deficits.  Stroke: left IC genu infarct - embolic secondary to atrial fibrillation not on AC.  Resultant  No deficit   MRI - Small acute infarct of the genu of the left internal capsule.  MRA - mild to moderate left MCA stenosis.  Carotid Doppler - pending  2D Echo - pending  LDL - 159  HgbA1c pending  VTE prophylaxis - Lovenox Diet Heart Room service appropriate? Yes; Fluid consistency: Thin  aspirin 325 mg daily prior to admission, now  on aspirin 325 mg daily. Patient is in agreement for DOAC. She prefer Xarelto due to once daily dosing. Due to the small size of the infarct, it is okay to start Xarelto today.  Patient counseled to be compliant with her antithrombotic medications  Ongoing aggressive stroke risk factor management  Therapy recommendations: No needs identified by PT or OT.  Disposition: Pending  Paroxysmal A. Fib  On aspirin PTA  Patient is in agreement for DOAC. She prefer Xarelto due to once daily dosing. Due to the small size of the infarct, it is okay to start Xarelto today.  On metoprolol for rate control  Continue follow-up with cardiology Dr. Lovena Le  Hypertension  Blood pressure runs high.  Permissive hypertension (OK if < 180/105) but gradually normalize in 5-7 days  Long-term BP goal normotensive  Hyperlipidemia  Home meds:  No lipid lowering medications prior to admission.  LDL 159, goal < 70  Now on Lipitor 40 mg daily  Continue statin at discharge  Other Stroke Risk Factors  Former smoker -  quit in 2000.  Obesity, Body mass index is 39.78 kg/m., recommend weight loss, diet and exercise as appropriate   Family hx stroke (mother and father)  OSA  Other [facility] day # 1  Rosalin Hawking, MD PhD Stroke Neurology 12/06/2015 4:33 PM   To contact Stroke Continuity provider, please refer to [email or web address] After hours, contact General Neurology

## 2015-12-06 NOTE — Progress Notes (Signed)
Cindy Faulkner is a transfer from [facility]. 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 While at work today her colleagues noted patient having difficulty with her memory. NIHSS 0. Pt to be admitted for TIA evaluation.

## 2015-12-06 NOTE — Progress Notes (Signed)
Occupational Therapy Evaluation Patient Details Name: Cindy Faulkner MRN: [id number] DOB: 1965-04-14 Today's Date: 12/06/2015    History of Present Illness 50 y.o. female with atrial tachycardia, hypertension, sleep apnea was noticed by patient's colleagues at work today that the patient was confused and talking inappropriately. Patient also was noticed to have periods of laughing. MRI brain shows left internal capsule stroke    Clinical Impression   Patient presents at independent level with ADLs, no OT needs, will sign off.    Follow Up Recommendations  No OT follow up    Equipment Recommendations  None recommended by OT    Recommendations for Other Services       Precautions / Restrictions Precautions Precautions: None Restrictions Weight Bearing Restrictions: No      Mobility Bed Mobility Overal bed mobility: Independent                Transfers Overall transfer level: Independent Equipment used: None             General transfer comment: ambulation in room to/from bathroom and sink    Balance Overall balance assessment: No apparent balance deficits (not formally assessed)                                          ADL Overall ADL's : Independent                                       General ADL Comments: Patient practiced don/doff gown and socks, ambulation to bathroom, groom at sink during evaluation at independent level.     Vision Vision Assessment?: No apparent visual deficits   Perception     Praxis      Pertinent Vitals/Pain Pain Assessment: No/denies pain     Hand Dominance Right   Extremity/Trunk Assessment Upper Extremity Assessment Upper Extremity Assessment: Overall WFL for tasks assessed   Lower Extremity Assessment Lower Extremity Assessment: Defer to PT evaluation       Communication Communication Communication: No difficulties   Cognition Arousal/Alertness:  Awake/alert Behavior During Therapy: WFL for tasks assessed/performed Overall Cognitive Status: Within Functional Limits for tasks assessed                     General Comments       Exercises       Shoulder Instructions      Home Living Family/patient expects to be discharged to:: Private residence Living Arrangements: Spouse/significant other;Children Available Help at Discharge: Family Type of Home: House Home Access: Stairs to enter Technical brewer of Steps: 6-8 Entrance Stairs-Rails: Right Home Layout: Two level;Able to live on main level with bedroom/bathroom     Bathroom Shower/Tub: Occupational psychologist: Standard (comfort height)     Home Equipment: None          Prior Functioning/Environment Level of Independence: Independent        Comments: works as Warden/ranger at [employer] List: Other (comment) (still feels her "thinking" is not as sharp as usual)   OT Treatment/Interventions:      OT Goals(Current goals can be found in the care plan section) Acute Rehab OT Goals Patient Stated Goal: none stated OT Goal Formulation: All assessment and education  complete, DC therapy  OT Frequency:     Barriers to D/C:            Co-evaluation              End of Session Nurse Communication: Mobility status  Activity Tolerance: Patient tolerated treatment well Patient left: in bed;with call bell/phone within reach;with bed alarm set;with family/visitor present   Time: [id number] OT Time Calculation (min): 15 min Charges:  OT General Charges $OT Visit: 1 Procedure OT Evaluation $OT Eval Low Complexity: 1 Procedure G-Codes:    Dayshon Roback A 12/27/15, 10:39 AM

## 2015-12-06 NOTE — Progress Notes (Signed)
Patient is being d/c home. D/c instructions given and patient verbalized understanding. Patient wheeled out of unit by staff.

## 2015-12-06 NOTE — Progress Notes (Signed)
  Echocardiogram 2D Echocardiogram has been performed.  Cindy Faulkner 12/06/2015, 3:41 PM

## 2015-12-06 NOTE — Discharge Summary (Signed)
Physician Discharge Summary  Cindy Faulkner ZLD:[id number] DOB: 22-Dec-1965 DOA: 12/05/2015  PCP: REDMON,NOELLE, PA-C  Admit date: 12/05/2015 Discharge date: 12/06/2015  Admitted From: home Disposition:  home  Recommendations for Outpatient Follow-up:  1. Follow up with PCP in 1-2 weeks 2. Patient is being discharged on Xarelto for secondary stroke prevention. 3. Follow up with neurology in 2 months.  Home Health:none Equipment/Devices:none   Discharge Condition:Fair CODE STATUS: Full code Diet recommendation: Heart Healthy    Discharge Diagnoses:  Principal Problem:   Cerebral thrombosis with cerebral infarction   Active Problems:   Essential hypertension   Atrial tachycardia (HCC)   Obstructive sleep apnea  Paroxysmal atrial fibrillation   Hyperlipidemia   Brief narrative/history of present illness 50 year old female with history of atrial tachycardia, paroxysmal A. fib on aspirin, obstructive sleep apnea, hypertension who was at work when her coworkers noticed patient to be confused and talking inappropriately with episodes of laughters. She was sent to the ED where she complained of mild left blurred vision. She was brought to [facility] ED as a code stroke. Neuro hospitalists examined and since NIH score was 0, no TPA was given. Symptoms gradually resolved. Stat head CT was negative for acute findings. MRI brain showed left internal capsule stroke and patient admitted for further management.  Hospital course Acute left internal capsule genuine infarct Suspect embolic secondary to atrial fibrillation. Patient on full dose aspirin as outpatient. No further residual symptoms. Reports strong family history of stroke. MRA shows mild to moderate left MCA stenosis. Carotid Doppler with nonsignificant stenosis on preliminary read (1-39%). 2-D echo shows normal EF with no wall motion abnormality. LDL of 159. A1c pending and should be followed as outpatient. Her CHADS2vasc  score now is 4. Anticoagulation option discussed by stroke team with patient and she  agrees to start Xarelto due to once a day dosing. Patient will be discharged on oral Xarelto 20 mg daily. Also started on Lipitor for hyperlipidemia. No PT/OT needs since patient is fully functional back to baseline..  Discussed extensively on diet adherence, weight loss and medication adherence including secondary stroke prevention. Risks and benefits of NOAC discussed. Patient should follow-up with neurology as outpatient. Ambulate a referral made. Plan discussed with Dr Erlinda Hong in detail.  Hyperlipidemia Start on Lipitor 40 mg daily.  Essential hypertension Blood pressure elevated, allow permissive hypertension. Resume home blood pressure medication. Adjust medications as outpatient.  Obstructive sleep apnea  Family communication: Husband and mother at bedside  Consults: Neurology  Disposition: Patient stable for discharge home with outpatient follow-up.  Discharge Instructions     Medication List    STOP taking these medications   aspirin 325 MG tablet     TAKE these medications   ALPRAZolam 0.25 MG tablet Commonly known as:  XANAX Take 0.25 mg by mouth at bedtime as needed for sleep.   atorvastatin 40 MG tablet Commonly known as:  LIPITOR Take 1 tablet (40 mg total) by mouth daily at 6 PM.   Biotin 1000 MCG tablet Take 1,000 mcg by mouth daily.   cholecalciferol 1000 units tablet Commonly known as:  VITAMIN D Take 2,000 Units by mouth daily.   CURCUMIN 95 500 MG Caps Generic drug:  Turmeric Take 1 tablet by mouth daily.   Fish Oil 1000 MG Caps Take 2 capsules by mouth daily.   flecainide 50 MG tablet Commonly known as:  TAMBOCOR Take 1 tablet (50 mg total) by mouth daily.   furosemide 40 MG tablet Commonly  known as:  LASIX Take 1 tablet (40 mg total) by mouth 2 (two) times daily. What changed:  when to take this   GLUCOSAMINE CHONDR COMPLEX PO Take 2 capsules by  mouth daily.   metoprolol 50 MG tablet Commonly known as:  LOPRESSOR TAKE ONE TABLET BY MOUTH ONCE DAILY   multivitamin tablet Take 2 tablets by mouth daily.   rivaroxaban 20 MG Tabs tablet Commonly known as:  XARELTO Take 1 tablet (20 mg total) by mouth daily.   rivaroxaban Kit Commonly known as:  XARELTO 1 kit by Does not apply route once.       No Known Allergies    Procedures/Studies: Mr Brain Wo Contrast  Result Date: 12/05/2015 CLINICAL DATA:  Code stroke.  Altered mental status. EXAM: MRI HEAD WITHOUT CONTRAST MRA HEAD WITHOUT CONTRAST TECHNIQUE: Multiplanar, multiecho pulse sequences of the brain and surrounding structures were obtained without intravenous contrast. Angiographic images of the head were obtained using MRA technique without contrast. COMPARISON:  None. FINDINGS: MRI HEAD FINDINGS Brain: There is a small focus of diffusion restriction at the genu of the left internal capsule. The midline structures are normal. There is mild scattered hyperintense T2-weighted signal within the subcortical white matter, possibly indicating early chronic microvascular ischemia, although these findings may also be seen in normal patients of this age. No mass lesion or midline shift. No hydrocephalus or extra-axial fluid collection. Vascular: Major intracranial arterial and venous sinus flow voids are preserved. No evidence of chronic microhemorrhage or amyloid angiopathy. Skull and upper cervical spine: The visualized skull base, calvarium, upper cervical spine and extracranial soft tissues are normal. Sinuses/Orbits: No fluid levels or advanced mucosal thickening. No mastoid effusion. Normal orbits. MRA HEAD FINDINGS Intracranial internal carotid arteries: Mild narrowing of the distal cervical left ICA. There is moderate stenosis of the left ICA terminus and mild narrowing of the proximal left MCA. The right ICA is normal. Anterior cerebral arteries: Absent left A1 segment, a congenital  variant. Normal right ACA. Normal A2 segments bilaterally. Middle cerebral arteries: As above, mild narrowing of the proximal left MCA. Posterior communicating arteries: Present on the right. Absent on the left. Posterior cerebral arteries: Normal. Basilar artery: Normal. Vertebral arteries: Codominant. Normal. Superior cerebellar arteries: Normal. Anterior inferior cerebellar arteries: Not clearly visualized, which is not uncommon. Posterior inferior cerebellar arteries: Normal. IMPRESSION: 1. Small acute infarct of the genu of the left internal capsule. No associated hemorrhage or mass effect. 2. Mild-to-moderate narrowing of the left internal carotid artery clinoid segment and carotid terminus, with mild narrowing of the proximal right MCA. 3. Mild narrowing of the distal cervical left ICA. These results will be called to the ordering clinician or representative by the Radiologist Assistant, and communication documented in the PACS or zVision Dashboard. Electronically Signed   By: Ulyses Jarred M.D.   On: 12/05/2015 22:03   Mr Jodene Nam Headm  Result Date: 12/05/2015 CLINICAL DATA:  Code stroke.  Altered mental status. EXAM: MRI HEAD WITHOUT CONTRAST MRA HEAD WITHOUT CONTRAST TECHNIQUE: Multiplanar, multiecho pulse sequences of the brain and surrounding structures were obtained without intravenous contrast. Angiographic images of the head were obtained using MRA technique without contrast. COMPARISON:  None. FINDINGS: MRI HEAD FINDINGS Brain: There is a small focus of diffusion restriction at the genu of the left internal capsule. The midline structures are normal. There is mild scattered hyperintense T2-weighted signal within the subcortical white matter, possibly indicating early chronic microvascular ischemia, although these findings may also be seen in normal  patients of this age. No mass lesion or midline shift. No hydrocephalus or extra-axial fluid collection. Vascular: Major intracranial arterial and venous  sinus flow voids are preserved. No evidence of chronic microhemorrhage or amyloid angiopathy. Skull and upper cervical spine: The visualized skull base, calvarium, upper cervical spine and extracranial soft tissues are normal. Sinuses/Orbits: No fluid levels or advanced mucosal thickening. No mastoid effusion. Normal orbits. MRA HEAD FINDINGS Intracranial internal carotid arteries: Mild narrowing of the distal cervical left ICA. There is moderate stenosis of the left ICA terminus and mild narrowing of the proximal left MCA. The right ICA is normal. Anterior cerebral arteries: Absent left A1 segment, a congenital variant. Normal right ACA. Normal A2 segments bilaterally. Middle cerebral arteries: As above, mild narrowing of the proximal left MCA. Posterior communicating arteries: Present on the right. Absent on the left. Posterior cerebral arteries: Normal. Basilar artery: Normal. Vertebral arteries: Codominant. Normal. Superior cerebellar arteries: Normal. Anterior inferior cerebellar arteries: Not clearly visualized, which is not uncommon. Posterior inferior cerebellar arteries: Normal. IMPRESSION: 1. Small acute infarct of the genu of the left internal capsule. No associated hemorrhage or mass effect. 2. Mild-to-moderate narrowing of the left internal carotid artery clinoid segment and carotid terminus, with mild narrowing of the proximal right MCA. 3. Mild narrowing of the distal cervical left ICA. These results will be called to the ordering clinician or representative by the Radiologist Assistant, and communication documented in the PACS or zVision Dashboard. Electronically Signed   By: Ulyses Jarred M.D.   On: 12/05/2015 22:03   Ct Head Code Stroke W/o Cm  Result Date: 12/05/2015 CLINICAL DATA:  Code stroke.  Sudden onset altered mental status. EXAM: CT HEAD WITHOUT CONTRAST TECHNIQUE: Contiguous axial images were obtained from the base of the skull through the vertex without intravenous contrast.  COMPARISON:  None. FINDINGS: Brain: No mass lesion, intraparenchymal hemorrhage or extra-axial collection. No evidence of acute cortical infarct. Brain parenchyma and CSF-containing spaces are normal for age. Vascular: No hyperdense vessel or unexpected calcification. Skull: Normal visualized skull base, calvarium and extracranial soft tissues. Sinuses/Orbits: No sinus fluid levels or advanced mucosal thickening. No mastoid effusion. Normal orbits. ASPECTS [facility] Stroke Program Early CT Score) - Ganglionic level infarction (caudate, lentiform nuclei, internal capsule, insula, M1-M3 cortex): 7 - Supraganglionic infarction (M4-M6 cortex): 3 Total score (0-10 with 10 being normal): 10 IMPRESSION: 1. No acute intracranial abnormality. 2. ASPECTS is 10. These results were called by telephone at the time of interpretation on 12/05/2015 at 6:56 pm to Dr. Isla Pence , who verbally acknowledged these results. Electronically Signed   By: Ulyses Jarred M.D.   On: 12/05/2015 18:57    2-D echo Study Conclusions  - Left ventricle: The cavity size was normal. Wall thickness was   normal. Systolic function was normal. The estimated ejection   fraction was in the range of 60% to 65%. - Mitral valve: There was mild regurgitation. - Left atrium: The atrium was moderately dilated. - Atrial septum: No defect or patent foramen ovale was identified. - Tricuspid valve: There was mild regurgitation.  Subjective: Denies further symptoms.  Discharge Exam: Vitals:   12/06/15 1352 12/06/15 1500  BP: (!) 181/73 (!) 167/66  Pulse: (!) 54 77  Resp: 20   Temp: 98.4 F (36.9 C) 99 F (37.2 C)   Vitals:   12/06/15 0953 12/06/15 1234 12/06/15 1352 12/06/15 1500  BP: (!) 169/78 (!) 158/78 (!) 181/73 (!) 167/66  Pulse: 60 (!) 59 (!) 54 77  Resp:  16  20   Temp: 98.1 F (36.7 C) 98 F (36.7 C) 98.4 F (36.9 C) 99 F (37.2 C)  TempSrc:  Oral Oral Oral  SpO2: 99% 96% 97% 97%  Weight:      Height:         General: Pt is alert, awake, not in acute distress Cardiovascular: RRR, S1/S2 +, no rubs, no gallops Respiratory: CTA bilaterally, no wheezing, no rhonchi Abdominal: Soft, NT, ND, bowel sounds + Extremities: no edema, no cyanosis    The results of significant diagnostics from this hospitalization (including imaging, microbiology, ancillary and laboratory) are listed below for reference.     Microbiology: No results found for this or any previous visit (from the past 240 hour(s)).   Labs: BNP (last 3 results) No results for input(s): BNP in the last 8760 hours. Basic Metabolic Panel:  Recent Labs Lab 12/05/15 1840 12/05/15 1852 12/06/15 0226  NA 137 140 136  K 3.9 4.3 3.8  CL 104 103 104  CO2 26  --  26  GLUCOSE 103* 104* 110*  BUN 20 24* 14  CREATININE 0.78 0.80 0.73  CALCIUM 9.2  --  8.7*   Liver Function Tests:  Recent Labs Lab 12/05/15 1840 12/06/15 0226  AST 34 29  ALT 42 34  ALKPHOS 51 37*  BILITOT 0.6 0.5  PROT 7.5 6.0*  ALBUMIN 4.3 3.4*   No results for input(s): LIPASE, AMYLASE in the last 168 hours. No results for input(s): AMMONIA in the last 168 hours. CBC:  Recent Labs Lab 12/05/15 1840 12/05/15 1852 12/06/15 0226  WBC 7.6  --  6.3  NEUTROABS 2.6  --   --   HGB 13.4 13.6 12.5  HCT 40.1 40.0 37.8  MCV 88.3  --  87.5  PLT 275  --  198   Cardiac Enzymes: No results for input(s): CKTOTAL, CKMB, CKMBINDEX, TROPONINI in the last 168 hours. BNP: Invalid input(s): POCBNP CBG: No results for input(s): GLUCAP in the last 168 hours. D-Dimer No results for input(s): DDIMER in the last 72 hours. Hgb A1c No results for input(s): HGBA1C in the last 72 hours. Lipid Profile  Recent Labs  12/06/15 0226  CHOL 224*  HDL 42  LDLCALC 159*  TRIG 116  CHOLHDL 5.3   Thyroid function studies No results for input(s): TSH, T4TOTAL, T3FREE, THYROIDAB in the last 72 hours.  Invalid input(s): FREET3 Anemia work up No results for input(s):  VITAMINB12, FOLATE, FERRITIN, TIBC, IRON, RETICCTPCT in the last 72 hours. Urinalysis    Component Value Date/Time   COLORURINE YELLOW 12/05/2015 2033   APPEARANCEUR CLOUDY (A) 12/05/2015 2033   LABSPEC 1.026 12/05/2015 2033   PHURINE 6.0 12/05/2015 2033   GLUCOSEU NEGATIVE 12/05/2015 2033   HGBUR MODERATE (A) 12/05/2015 2033   BILIRUBINUR NEGATIVE 12/05/2015 2033   [place] 12/05/2015 2033   PROTEINUR NEGATIVE 12/05/2015 2033   UROBILINOGEN 0.2 11/27/2010 1122   NITRITE NEGATIVE 12/05/2015 2033   LEUKOCYTESUR NEGATIVE 12/05/2015 2033   Sepsis Labs Invalid input(s): PROCALCITONIN,  WBC,  LACTICIDVEN Microbiology No results found for this or any previous visit (from the past 240 hour(s)).   Time coordinating discharge: <30 minutes  SIGNED:   Louellen Molder, MD  [facility] 12/06/2015, 5:23 PM Pager   If 7PM-7AM, please contact night-coverage [email or web address] Password [email or web address]

## 2015-12-06 NOTE — Discharge Instructions (Signed)
Stroke Prevention Some medical conditions and behaviors are associated with an increased chance of having a stroke. You may prevent a stroke by making healthy choices and managing medical conditions. HOW CAN I REDUCE MY RISK OF HAVING A STROKE?   Stay physically active. Get at least 30 minutes of activity on most or all days.  Do not smoke. It may also be helpful to avoid exposure to secondhand smoke.  Limit alcohol use. Moderate alcohol use is considered to be:  No more than 2 drinks per day for men.  No more than 1 drink per day for nonpregnant women.  Eat healthy foods. This involves:  Eating 5 or more servings of fruits and vegetables a day.  Making dietary changes that address high blood pressure (hypertension), high cholesterol, diabetes, or obesity.  Manage your cholesterol levels.  Making food choices that are high in fiber and low in saturated fat, trans fat, and cholesterol may control cholesterol levels.  Take any prescribed medicines to control cholesterol as directed by your health care provider.  Manage your diabetes.  Controlling your carbohydrate and sugar intake is recommended to manage diabetes.  Take any prescribed medicines to control diabetes as directed by your health care provider.  Control your hypertension.  Making food choices that are low in salt (sodium), saturated fat, trans fat, and cholesterol is recommended to manage hypertension.  Ask your health care provider if you need treatment to lower your blood pressure. Take any prescribed medicines to control hypertension as directed by your health care provider.  If you are 64-7 years of age, have your blood pressure checked every 3-5 years. If you are 41 years of age or older, have your blood pressure checked every year.  Maintain a healthy weight.  Reducing calorie intake and making food choices that are low in sodium, saturated fat, trans fat, and cholesterol are recommended to manage  weight.  Stop drug abuse.  Avoid taking birth control pills.  Talk to your health care provider about the risks of taking birth control pills if you are over 23 years old, smoke, get migraines, or have ever had a blood clot.  Get evaluated for sleep disorders (sleep apnea).  Talk to your health care provider about getting a sleep evaluation if you snore a lot or have excessive sleepiness.  Take medicines only as directed by your health care provider.  For some people, aspirin or blood thinners (anticoagulants) are helpful in reducing the risk of forming abnormal blood clots that can lead to stroke. If you have the irregular heart rhythm of atrial fibrillation, you should be on a blood thinner unless there is a good reason you cannot take them.  Understand all your medicine instructions.  Make sure that other conditions (such as anemia or atherosclerosis) are addressed. SEEK IMMEDIATE MEDICAL CARE IF:   You have sudden weakness or numbness of the face, arm, or leg, especially on one side of the body.  Your face or eyelid droops to one side.  You have sudden confusion.  You have trouble speaking (aphasia) or understanding.  You have sudden trouble seeing in one or both eyes.  You have sudden trouble walking.  You have dizziness.  You have a loss of balance or coordination.  You have a sudden, severe headache with no known cause.  You have new chest pain or an irregular heartbeat. Any of these symptoms may represent a serious problem that is an emergency. Do not wait to see if the symptoms will  go away. Get medical help at once. Call your local emergency services (911 in [place] Do not drive yourself to the hospital.   This information is not intended to replace advice given to you by your health care provider. Make sure you discuss any questions you have with your health care provider.   Document Released: 03/18/2004 Document Revised: 03/01/2014 Document Reviewed:  08/11/2012 Elsevier Interactive Patient Education 2016 Elsevier Inc.   Rivaroxaban oral tablets What is this medicine? RIVAROXABAN (ri va ROX a ban) is an anticoagulant (blood thinner). It is used to treat blood clots in the lungs or in the veins. It is also used after knee or hip surgeries to prevent blood clots. It is also used to lower the chance of stroke in people with a medical condition called atrial fibrillation. This medicine may be used for other purposes; ask your health care provider or pharmacist if you have questions. What should I tell my health care provider before I take this medicine? They need to know if you have any of these conditions: -bleeding disorders -bleeding in the brain -blood in your stools (black or tarry stools) or if you have blood in your vomit -history of stomach bleeding -kidney disease -liver disease -low blood counts, like low white cell, platelet, or red cell counts -recent or planned spinal or epidural procedure -take medicines that treat or prevent blood clots -an unusual or allergic reaction to rivaroxaban, other medicines, foods, dyes, or preservatives -pregnant or trying to get pregnant -breast-feeding How should I use this medicine? Take this medicine by mouth with a glass of water. Follow the directions on the prescription label. Take your medicine at regular intervals. Do not take it more often than directed. Do not stop taking except on your doctor's advice. Stopping this medicine may increase your risk of a blood clot. Be sure to refill your prescription before you run out of medicine. If you are taking this medicine after hip or knee replacement surgery, take it with or without food. If you are taking this medicine for atrial fibrillation, take it with your evening meal. If you are taking this medicine to treat blood clots, take it with food at the same time each day. If you are unable to swallow your tablet, you may crush the tablet and mix  it in applesauce. Then, immediately eat the applesauce. You should eat more food right after you eat the applesauce containing the crushed tablet. Talk to your pediatrician regarding the use of this medicine in children. Special care may be needed. Overdosage: If you think you have taken too much of this medicine contact a poison control center or emergency room at once. NOTE: This medicine is only for you. Do not share this medicine with others. What if I miss a dose? If you take your medicine once a day and miss a dose, take the missed dose as soon as you remember. If you take your medicine twice a day and miss a dose, take the missed dose immediately. In this instance, 2 tablets may be taken at the same time. The next day you should take 1 tablet twice a day as directed. What may interact with this medicine? -aspirin and aspirin-like medicines -certain antibiotics like erythromycin, azithromycin, and clarithromycin -certain medicines for fungal infections like ketoconazole and itraconazole -certain medicines for irregular heart beat like amiodarone, quinidine, dronedarone -certain medicines for seizures like carbamazepine, phenytoin -certain medicines that treat or prevent blood clots like warfarin, enoxaparin, and dalteparin -conivaptan -diltiazem -felodipine -  indinavir -lopinavir; ritonavir -NSAIDS, medicines for pain and inflammation, like ibuprofen or naproxen -ranolazine -rifampin -ritonavir -St. John's wort -verapamil This list may not describe all possible interactions. Give your health care provider a list of all the medicines, herbs, non-prescription drugs, or dietary supplements you use. Also tell them if you smoke, drink alcohol, or use illegal drugs. Some items may interact with your medicine. What should I watch for while using this medicine? Visit your doctor or health care professional for regular checks on your progress. Your condition will be monitored carefully while  you are receiving this medicine. Notify your doctor or health care professional and seek emergency treatment if you develop breathing problems; changes in vision; chest pain; severe, sudden headache; pain, swelling, warmth in the leg; trouble speaking; sudden numbness or weakness of the face, arm, or leg. These can be signs that your condition has gotten worse. If you are going to have surgery, tell your doctor or health care professional that you are taking this medicine. Tell your health care professional that you use this medicine before you have a spinal or epidural procedure. Sometimes people who take this medicine have bleeding problems around the spine when they have a spinal or epidural procedure. This bleeding is very rare. If you have a spinal or epidural procedure while on this medicine, call your health care professional immediately if you have back pain, numbness or tingling (especially in your legs and feet), muscle weakness, paralysis, or loss of bladder or bowel control. Avoid sports and activities that might cause injury while you are using this medicine. Severe falls or injuries can cause unseen bleeding. Be careful when using sharp tools or knives. Consider using an Copy. Take special care brushing or flossing your teeth. Report any injuries, bruising, or red spots on the skin to your doctor or health care professional. What side effects may I notice from receiving this medicine? Side effects that you should report to your doctor or health care professional as soon as possible: -allergic reactions like skin rash, itching or hives, swelling of the face, lips, or tongue -back pain -redness, blistering, peeling or loosening of the skin, including inside the mouth -signs and symptoms of bleeding such as bloody or black, tarry stools; red or dark-brown urine; spitting up blood or brown material that looks like coffee grounds; red spots on the skin; unusual bruising or bleeding from  the eye, gums, or nose Side effects that usually do not require medical attention (Report these to your doctor or health care professional if they continue or are bothersome.): -dizziness -muscle pain This list may not describe all possible side effects. Call your doctor for medical advice about side effects. You may report side effects to FDA at 1-800-FDA-1088. Where should I keep my medicine? Keep out of the reach of children. Store at room temperature between 15 and 30 degrees C (59 and 86 degrees F). Throw away any unused medicine after the expiration date. NOTE: This sheet is a summary. It may not cover all possible information. If you have questions about this medicine, talk to your doctor, pharmacist, or health care provider.    2016, Elsevier/Gold Standard. (2014-02-06 12:45:34)

## 2015-12-06 NOTE — Progress Notes (Signed)
PT Cancellation Note  Patient Details Name: KASELYNN BARG MRN: [id number] DOB: September 07, 1965   Cancelled Treatment:    Reason Eval/Treat Not Completed: PT screened, no needs identified, will sign off. Pt is independent with all functional mobility. She reports her cognitive and language skills are all that seem to be affected.    Lorriane Shire 12/06/2015, 11:31 AM

## 2015-12-07 ENCOUNTER — Other Ambulatory Visit: Payer: Self-pay | Admitting: Neurology

## 2015-12-07 DIAGNOSIS — I6330 Cerebral infarction due to thrombosis of unspecified cerebral artery: Secondary | ICD-10-CM

## 2015-12-07 LAB — HEMOGLOBIN A1C
HEMOGLOBIN A1C: 5.8 % — AB (ref 4.8–5.6)
Mean Plasma Glucose: 120 mg/dL

## 2015-12-08 DIAGNOSIS — G4733 Obstructive sleep apnea (adult) (pediatric): Secondary | ICD-10-CM | POA: Diagnosis not present

## 2015-12-08 LAB — VAS US CAROTID
LCCAPDIAS: 19 cm/s
LCCAPSYS: 122 cm/s
LEFT ECA DIAS: -5 cm/s
LEFT VERTEBRAL DIAS: 20 cm/s
Left CCA dist dias: -14 cm/s
Left CCA dist sys: -67 cm/s
Left ICA dist dias: -22 cm/s
Left ICA dist sys: -57 cm/s
Left ICA prox dias: -16 cm/s
Left ICA prox sys: -53 cm/s
RCCAPDIAS: 12 cm/s
RIGHT ECA DIAS: -16 cm/s
RIGHT VERTEBRAL DIAS: 12 cm/s
Right CCA prox sys: 81 cm/s
Right cca dist sys: -107 cm/s

## 2015-12-08 LAB — CBG MONITORING, ED: Glucose-Capillary: 105 mg/dL — ABNORMAL HIGH (ref 65–99)

## 2015-12-11 ENCOUNTER — Telehealth: Payer: Self-pay | Admitting: Internal Medicine

## 2015-12-11 DIAGNOSIS — G4730 Sleep apnea, unspecified: Secondary | ICD-10-CM | POA: Diagnosis not present

## 2015-12-11 DIAGNOSIS — I10 Essential (primary) hypertension: Secondary | ICD-10-CM | POA: Diagnosis not present

## 2015-12-11 DIAGNOSIS — I639 Cerebral infarction, unspecified: Secondary | ICD-10-CM | POA: Diagnosis not present

## 2015-12-11 DIAGNOSIS — E7800 Pure hypercholesterolemia, unspecified: Secondary | ICD-10-CM | POA: Diagnosis not present

## 2015-12-11 DIAGNOSIS — M1990 Unspecified osteoarthritis, unspecified site: Secondary | ICD-10-CM | POA: Diagnosis not present

## 2015-12-11 DIAGNOSIS — I480 Paroxysmal atrial fibrillation: Secondary | ICD-10-CM | POA: Diagnosis not present

## 2015-12-11 NOTE — Telephone Encounter (Signed)
Patient states that she is due for another colonoscopy and is requesting to see Dr. Carlean Purl. Records placed on Dr. Celesta Aver desk for review.

## 2015-12-17 NOTE — Telephone Encounter (Signed)
Dr. Carlean Purl reviewed records and has accepted patient. Recall Colon has been entered for 08/2019. Left message for patient to return my call.

## 2016-01-08 DIAGNOSIS — G4733 Obstructive sleep apnea (adult) (pediatric): Secondary | ICD-10-CM | POA: Diagnosis not present

## 2016-01-22 MED FILL — ATORVASTATIN 40 MG TABLET: 40 | 90 days supply | Qty: 90 | Fill #0

## 2016-01-23 MED FILL — XARELTO 20 MG TABLET: 20 | 90 days supply | Qty: 90 | Fill #0

## 2016-02-07 DIAGNOSIS — G4733 Obstructive sleep apnea (adult) (pediatric): Secondary | ICD-10-CM | POA: Diagnosis not present

## 2016-02-10 DIAGNOSIS — G4733 Obstructive sleep apnea (adult) (pediatric): Secondary | ICD-10-CM | POA: Diagnosis not present

## 2016-02-18 ENCOUNTER — Other Ambulatory Visit (INDEPENDENT_AMBULATORY_CARE_PROVIDER_SITE_OTHER): Payer: 59

## 2016-02-18 ENCOUNTER — Ambulatory Visit (INDEPENDENT_AMBULATORY_CARE_PROVIDER_SITE_OTHER): Payer: 59 | Admitting: Neurology

## 2016-02-18 ENCOUNTER — Encounter: Payer: Self-pay | Admitting: Neurology

## 2016-02-18 VITALS — BP 136/88 | HR 99 | Ht 67.0 in | Wt 272.6 lb

## 2016-02-18 DIAGNOSIS — R5383 Other fatigue: Secondary | ICD-10-CM

## 2016-02-18 DIAGNOSIS — F320 Major depressive disorder, single episode, mild: Secondary | ICD-10-CM

## 2016-02-18 DIAGNOSIS — I6310 Cerebral infarction due to embolism of unspecified precerebral artery: Secondary | ICD-10-CM

## 2016-02-18 LAB — TSH: TSH: 0.59 u[IU]/mL (ref 0.35–4.50)

## 2016-02-18 LAB — VITAMIN B12: VITAMIN B 12: 400 pg/mL (ref 211–911)

## 2016-02-18 LAB — VITAMIN D 25 HYDROXY (VIT D DEFICIENCY, FRACTURES): VITD: 30.02 ng/mL (ref 30.00–100.00)

## 2016-02-18 MED ORDER — CITALOPRAM HYDROBROMIDE 10 MG PO TABS: 10.0000 mg | ORAL_TABLET | Freq: Every day | ORAL | 11 refills | Status: DC

## 2016-02-18 MED FILL — CITALOPRAM HBR 10 MG TABLET: 10 | 30 days supply | Qty: 30 | Fill #0

## 2016-02-18 NOTE — Patient Instructions (Signed)
1. Bloodwork for TSH, B12, vitamin D 2. Start citalopram 10mg  daily 3. Continue all your other medications, it is very important to control BP, cholesterol 4. If symptoms change, go to ER immediately 5. Follow-up in 6 months, call for any changes

## 2016-02-18 NOTE — Progress Notes (Signed)
NEUROLOGY CONSULTATION NOTE  Cindy Faulkner MRN: [id number] DOB: [birth year]  Referring provider: Lennie Odor, PA-C Primary care provider: Lennie Odor, PA-C  Reason for consult:  Stroke in October 2017  Thank you for your kind referral of Cindy Faulkner for consultation of the above symptoms. Although her history is well known to you, please allow me to reiterate it for the purpose of our medical record. The patient was accompanied to the clinic by her daughter who also provides collateral information. Records and images were personally reviewed where available.  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 They noticed a left facial droop and convinced her to go to the ER. On exam, she was noted to be slow to respond but able to answer questions correctly. No focal weakness seen. She reported "a little blurry vision in her left eye." She was transferred to [facility] where MRI/MRA brain without contrast was done which I personally reviewed, there was a small acute infarct in the genu of the left internal capsule. There was mild to moderate narrowing of the left ICA clinoid segment and carotid terminus, with mild narrowing of the proximal right MCA, mild narrowing of the distal cervical left ICA. Echo showed normal EF 60-65%, left atrium moderately dilated. Carotid doppler showed intimal thickening CCA, mild soft plaque at the origin of bilateral ICA with 1-39% stenosis. Lipid panel showed an LDL of 159, total cholesterol 224. HbA1c 5.8. Stroke was felt to be embolic from atrial fibrillation, she was started on  Xarelto. Over the course of her stay, she started feeling better except for still some memory difficulties. She denied any associated headache, dizziness, diplopia, vision loss, dysarthria/dyspahgia, focal numbness/tingling/weakness.   The first week home she was very physically tired and wanted to sleep all the time. She started feeling better the second week, and was back to work the third week. She however has noticed that she is very tired once she gets home. Her daughter has noticed this as well. Her husband has told her of short term memory loss, she thinks she is okay. She denies any difficulties at work. She denies getting lost driving. She is slacking on bill payments which she is usually on top of, stating "if they get paid, they get paid, I just don't care." No missed medications. Her daughter has noticed similar changes at home as well, she can tell a big difference, stating her mother is "not the same person at all." She used to be very social, now she just sits and has people come talk to her. Her daughter would tell her something 2 days prior and she would have no recollection of this. She used to be very active on her day off, but now would stay all day instead of do errands. She states that she "does not care about anything." For instance, her family had to strongly encourage her to put up their tree, which she usually did without prompting. She only decorated it halfway through and "did not care to finish it." Recently she has been tossing and turning in sleep, feels rested with her CPAP but states  she is always tired. She has gained 80 lbs in the past 1.5 years. She has occasional dizziness with spinning sensation on head turn to the left when lying down. She usually waits a minute and the dizziness passes, no nausea/vomiting, vision changes, neck/back pain, bowel/bladder dysfunction. There is a family history of stroke in both parents. Her mother and 3 siblings had memory issues. She denies any  head injuries, tobacco or alcohol intake.   PAST MEDICAL HISTORY: Past Medical History:  Diagnosis Date  . Atrial fibrillation ([place])    a. echo 4/07: EF 60%  . Atrial tachycardia ([place])    a. s/p EPS 4/09: no inducible SVT - med Tx continued  . Complication of anesthesia    age 28yo, woke during procedure with GA, paralysed   . Endometrial polyp   . Hypertension   . PONV (postoperative nausea and vomiting)   . Rectocele    WITHOUT MENTION OF UTERINE PROLAPSE  . SVD (spontaneous vaginal delivery)    x 1    PAST SURGICAL HISTORY: Past Surgical History:  Procedure Laterality Date  . COLONOSCOPY    . HYSTEROSCOPY    . LEEP    . Pylonidal cystect    . TUBAL LIGATION    . WISDOM TOOTH EXTRACTION      MEDICATIONS: Current Outpatient Prescriptions on File Prior to Visit  Medication Sig Dispense Refill  . ALPRAZolam (XANAX) 0.25 MG tablet Take 0.25 mg by mouth at bedtime as needed for sleep.     Marland Kitchen atorvastatin (LIPITOR) 40 MG tablet Take 1 tablet (40 mg total) by mouth daily at 6 PM. 30 tablet 0  . Biotin 1000 MCG tablet Take 1,000 mcg by mouth daily.    . cholecalciferol (VITAMIN D) 1000 units tablet Take 2,000 Units by mouth daily.    . flecainide (TAMBOCOR) 50 MG tablet Take 1 tablet (50 mg total) by mouth daily. 90 tablet 3  . furosemide (LASIX) 40 MG tablet Take 1 tablet (40 mg total) by mouth 2 (two) times daily. (Patient taking differently: Take 40 mg by mouth daily. ) 180 tablet 3  . Glucosamine-Chondroitin (GLUCOSAMINE CHONDR COMPLEX PO) Take 2 capsules by mouth daily.     . metoprolol (LOPRESSOR) 50 MG tablet TAKE ONE TABLET BY MOUTH ONCE DAILY 90 tablet 3  . Multiple Vitamin (MULTIVITAMIN) tablet Take 2 tablets by mouth daily.     . Omega-3 Fatty Acids (FISH OIL) 1000 MG CAPS Take 2 capsules by mouth daily.      . rivaroxaban (XARELTO) 20 MG TABS tablet Take 1 tablet (20 mg total) by mouth daily. 30 tablet 0  . Turmeric (CURCUMIN 95) 500 MG CAPS Take 1 tablet by mouth  daily.      No current facility-administered medications on file prior to visit.     ALLERGIES: No Known Allergies  FAMILY HISTORY: Family History  Problem Relation Age of Onset  . Hypertension Father   . Diabetes Father   . Stroke Father   . Hypertension Mother   . Stroke Mother     SOCIAL HISTORY: Social History   Social History  . Marital status: Married    Spouse name: N/A  . Number of children: N/A  . Years of education: N/A   Occupational History  . Not on file.   Social History Main Topics  . Smoking status: Former Smoker    Packs/day: 0.50    Years: 10.00    Types: Cigarettes    Quit date: 02/22/1998  .  Smokeless tobacco: Never Used  . Alcohol use No  . Drug use: No  . Sexual activity: Not on file   Other Topics Concern  . Not on file   Social History Narrative  . No narrative on file    REVIEW OF SYSTEMS: Constitutional: No fevers, chills, or sweats, no generalized fatigue, change in appetite Eyes: No visual changes, double vision, eye pain Ear, nose and throat: No hearing loss, ear pain, nasal congestion, sore throat Cardiovascular: No chest pain, palpitations Respiratory:  No shortness of breath at rest or with exertion, wheezes GastrointestinaI: No nausea, vomiting, diarrhea, abdominal pain, fecal incontinence Genitourinary:  No dysuria, urinary retention or frequency Musculoskeletal:  No neck pain, back pain Integumentary: No rash, pruritus, skin lesions Neurological: as above Psychiatric: No depression, insomnia, anxiety Endocrine: No palpitations, fatigue, diaphoresis, mood swings, change in appetite, change in weight, increased thirst Hematologic/Lymphatic:  No anemia, purpura, petechiae. Allergic/Immunologic: no itchy/runny eyes, nasal congestion, recent allergic reactions, rashes  PHYSICAL EXAM: Vitals:   02/18/16 1043  BP: 136/88  Pulse: 99   General: No acute distress Head:  Normocephalic/atraumatic Eyes: Fundoscopic exam shows  bilateral sharp discs, no vessel changes, exudates, or hemorrhages Neck: supple, no paraspinal tenderness, full range of motion Back: No paraspinal tenderness Heart: regular rate and rhythm Lungs: Clear to auscultation bilaterally. Vascular: No carotid bruits. Skin/Extremities: No rash, no edema Neurological Exam: Mental status: alert and oriented to person, place, and time, no dysarthria or aphasia, Fund of knowledge is appropriate.  Remote memory intact.  Attention and concentration are normal.    Able to name objects and repeat phrases.  Montreal Cognitive Assessment  02/18/2016  Visuospatial/ Executive (0/5) 5  Naming (0/3) 3  Attention: Read list of digits (0/2) 2  Attention: Read list of letters (0/1) 1  Attention: Serial 7 subtraction starting at 100 (0/3) 3  Language: Repeat phrase (0/2) 2  Language : Fluency (0/1) 0  Abstraction (0/2) 2  Delayed Recall (0/5) 3  Orientation (0/6) 6  Total 27   Cranial nerves: CN I: not tested CN II: pupils equal, round and reactive to light, visual fields intact, fundi unremarkable. CN III, IV, VI:  full range of motion, no nystagmus, no ptosis CN V: facial sensation intact CN VII: upper and lower face symmetric CN VIII: hearing intact to finger rub CN IX, X: gag intact, uvula midline CN XI: sternocleidomastoid and trapezius muscles intact CN XII: tongue midline Bulk & Tone: normal, no fasciculations. Motor: 5/5 throughout with no pronator drift. Sensation: intact to light touch, cold, pin, vibration and joint position sense.  No extinction to double simultaneous stimulation.  Romberg test negative Deep Tendon Reflexes: +2 throughout, no ankle clonus Plantar responses: downgoing bilaterally Cerebellar: no incoordination on finger to nose, heel to shin. No dysdiadochokinesia Gait: narrow-based and steady, able to tandem walk adequately. Tremor: none  IMPRESSION: This is a pleasant 50 year old right-handed woman with a history of  hypertension, paroxysmal atrial fibrillation, in her usual state of health until 12/05/15 when she started acting oddly at work. She has minimal recollection of this. She was found to have a small acute infarct in the genu of the left internal capsule. There was mild to moderate narrowing of the left ICA clinoid segment and carotid terminus, with mild narrowing of the proximal right MCA, mild narrowing of the distal cervical left ICA. Echo showed normal EF 60-65%, left atrium moderately dilated. Carotid doppler showed intimal thickening CCA, mild soft plaque at the origin of  bilateral ICA with 1-39% stenosis. Stroke was felt to be embolic from atrial fibrillation, she is now on Xarelto. We discussed control of vascular risk factors. Her neurological exam today is normal, MOCA score normal 27/30. We discussed memory changes, as well as personality changes noted by her daughter. Symptoms suggestive of post-stroke depression, we discussed that a third of stroke patients can experience this. She is agreeable to starting citalopram 10mg  daily, side effects were discussed. She reports fatigue, check TSH, B12, vitamin D levels. She knows to go to the ER for any changes in symptoms and will follow-up in 6 months.   Thank you for allowing me to participate in the care of this patient. Please do not hesitate to call for any questions or concerns.   Ellouise Newer, M.D.  CC: Lennie Odor, PA-C

## 2016-02-26 ENCOUNTER — Encounter: Payer: Self-pay | Admitting: Neurology

## 2016-02-27 ENCOUNTER — Telehealth: Payer: Self-pay

## 2016-02-27 NOTE — Telephone Encounter (Signed)
Patient notified

## 2016-02-27 NOTE — Telephone Encounter (Signed)
-----   Message from Cameron Sprang, MD sent at 02/26/2016  1:12 PM EST ----- Pls let her know thyroid, B12, and vitamin D levels are normal. Thanks

## 2016-03-05 MED FILL — FLECAINIDE ACETATE 50 MG TA: 50 | 90 days supply | Qty: 90 | Fill #3

## 2016-03-05 MED FILL — METOPROLOL TARTRATE 50 MG T: 50 | 90 days supply | Qty: 90 | Fill #3

## 2016-03-17 DIAGNOSIS — Z8673 Personal history of transient ischemic attack (TIA), and cerebral infarction without residual deficits: Secondary | ICD-10-CM | POA: Diagnosis not present

## 2016-03-17 DIAGNOSIS — E7800 Pure hypercholesterolemia, unspecified: Secondary | ICD-10-CM | POA: Diagnosis not present

## 2016-03-17 DIAGNOSIS — I480 Paroxysmal atrial fibrillation: Secondary | ICD-10-CM | POA: Diagnosis not present

## 2016-03-17 DIAGNOSIS — M1990 Unspecified osteoarthritis, unspecified site: Secondary | ICD-10-CM | POA: Diagnosis not present

## 2016-03-17 DIAGNOSIS — I10 Essential (primary) hypertension: Secondary | ICD-10-CM | POA: Diagnosis not present

## 2016-03-17 DIAGNOSIS — F329 Major depressive disorder, single episode, unspecified: Secondary | ICD-10-CM | POA: Diagnosis not present

## 2016-03-17 MED FILL — CITALOPRAM HBR 20 MG TABLET: 20 | 90 days supply | Qty: 90 | Fill #0

## 2016-04-28 DIAGNOSIS — M170 Bilateral primary osteoarthritis of knee: Secondary | ICD-10-CM | POA: Diagnosis not present

## 2016-04-28 MED FILL — XARELTO 20 MG TABLET: 20 | 90 days supply | Qty: 90 | Fill #0

## 2016-04-28 MED FILL — ATORVASTATIN 40 MG TABLET: 40 | 90 days supply | Qty: 90 | Fill #0

## 2016-04-28 MED FILL — FUROSEMIDE 40 MG TABLET: 40 | 90 days supply | Qty: 180 | Fill #1

## 2016-05-05 ENCOUNTER — Ambulatory Visit: Payer: 59 | Admitting: Internal Medicine

## 2016-05-18 DIAGNOSIS — Z1231 Encounter for screening mammogram for malignant neoplasm of breast: Secondary | ICD-10-CM | POA: Diagnosis not present

## 2016-05-18 DIAGNOSIS — G4733 Obstructive sleep apnea (adult) (pediatric): Secondary | ICD-10-CM | POA: Diagnosis not present

## 2016-06-03 ENCOUNTER — Other Ambulatory Visit: Payer: Self-pay | Admitting: Internal Medicine

## 2016-06-04 ENCOUNTER — Other Ambulatory Visit: Payer: Self-pay | Admitting: Internal Medicine

## 2016-06-04 MED FILL — FLECAINIDE ACETATE 50 MG TA: 50 | 90 days supply | Qty: 90 | Fill #0

## 2016-06-04 MED FILL — METOPROLOL TARTRATE 50 MG T: 50 | 90 days supply | Qty: 90 | Fill #0

## 2016-06-21 MED FILL — CITALOPRAM HBR 20 MG TABLET: 20 | 90 days supply | Qty: 90 | Fill #1

## 2016-06-22 ENCOUNTER — Encounter: Payer: Self-pay | Admitting: Internal Medicine

## 2016-06-23 ENCOUNTER — Ambulatory Visit (INDEPENDENT_AMBULATORY_CARE_PROVIDER_SITE_OTHER): Payer: 59 | Admitting: Internal Medicine

## 2016-06-23 ENCOUNTER — Encounter: Payer: Self-pay | Admitting: Internal Medicine

## 2016-06-23 VITALS — BP 130/80 | HR 61 | Resp 16 | Ht 66.0 in | Wt 277.6 lb

## 2016-06-23 DIAGNOSIS — I480 Paroxysmal atrial fibrillation: Secondary | ICD-10-CM | POA: Diagnosis not present

## 2016-06-23 DIAGNOSIS — I6330 Cerebral infarction due to thrombosis of unspecified cerebral artery: Secondary | ICD-10-CM | POA: Diagnosis not present

## 2016-06-23 DIAGNOSIS — G4733 Obstructive sleep apnea (adult) (pediatric): Secondary | ICD-10-CM

## 2016-06-23 DIAGNOSIS — M170 Bilateral primary osteoarthritis of knee: Secondary | ICD-10-CM | POA: Diagnosis not present

## 2016-06-23 NOTE — Progress Notes (Signed)
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 DL attached. Pt states she wears CPAP nightly for about 6 hours. Pt has full face mask and having trouble with it-getting use to it. Otherwise sleeping much better.  CPAP auto 5-15 Download indicates 86% 4 hour compliance with AHI at 3.5. Pressures are comfortable. She is still getting use to keeping mask on and we discussed comfort issues. Sleep quality is getting better.  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 She is pleased with the way she feels with CPAP "definitely better off". Pressure is good. No residual after small CVA. We discussed possibility of a nasal mask with chinstrap.  ROS-see HPI   + = pos Constitutional:    weight loss, night sweats, fevers, chills, fatigue, lassitude. HEENT:    headaches, difficulty swallowing, tooth/dental problems, sore throat,       sneezing, itching, ear ache, nasal congestion, post nasal drip, snoring CV:    chest pain, orthopnea, PND, swelling in lower extremities, anasarca,                                                    dizziness, palpitations Resp:   shortness of breath with exertion or at rest.                productive cough,   non-productive cough, coughing up of blood.              change in color of mucus.  wheezing.   Skin:    rash or lesions. GI:  No-   heartburn, indigestion, abdominal pain, nausea, vomiting,  diarrhea,                 change in bowel habits, loss of appetite GU: dysuria, change in color of urine, no urgency or frequency.  MS:   joint pain, stiffness, decreased range of motion, back pain. Neuro-     nothing unusual Psych:  change in mood or affect.  depression or anxiety.   memory loss.  OBJ- Physical Exam General- Alert, Oriented, Affect-appropriate/cheerful, Distress- none acute, + Overweight Skin- rash-none, lesions- none, excoriation- none Lymphadenopathy- none Head- atraumatic            Eyes- Gross vision intact, PERRLA, conjunctivae and secretions clear            Ears- Hearing, canals-normal            Nose- Clear, no-Septal dev, mucus, polyps, erosion, perforation             Throat- Mallampati III , mucosa clear , drainage- none, tonsils- atrophic Neck- flexible , trachea midline, no stridor , thyroid nl, carotid no bruit Chest - symmetrical excursion , unlabored           Heart/CV- RRR , no murmur , no gallop  , no rub, nl s1 s2                           -  JVD- none , edema- none, stasis changes- none, varices- none           Lung- clear to P&A, wheeze- none, cough- none , dullness-none, rub- none           Chest wall-  Abd-  Br/ Gen/ Rectal- Not done, not indicated Extrem- cyanosis- none, clubbing, none, atrophy- none, strength- nl Neuro- grossly intact to observation

## 2016-06-23 NOTE — Patient Instructions (Signed)
Order- [facility]- ok to continue CPAP auto 5-15, mask of choice, humidifier, supplies, AirView   Dx OSA                         Add chin strap if needed   Please call if we can help

## 2016-06-27 NOTE — Assessment & Plan Note (Signed)
She continues to benefit and download confirms excellent compliance and control. She may want to try a nasal mask with chinstrap as an alternative as discussed.

## 2016-06-27 NOTE — Assessment & Plan Note (Signed)
Rhythm at this exam is regular consistent with normal sinus rhythm

## 2016-06-27 NOTE — Assessment & Plan Note (Signed)
She says there is now very little residual

## 2016-07-14 DIAGNOSIS — H5213 Myopia, bilateral: Secondary | ICD-10-CM | POA: Diagnosis not present

## 2016-08-02 MED FILL — ATORVASTATIN 40 MG TABLET: 40 | 90 days supply | Qty: 90 | Fill #1

## 2016-08-04 MED FILL — XARELTO 20 MG TABLET: 20 | 90 days supply | Qty: 90 | Fill #1

## 2016-08-18 ENCOUNTER — Ambulatory Visit (INDEPENDENT_AMBULATORY_CARE_PROVIDER_SITE_OTHER): Payer: 59 | Admitting: Neurology

## 2016-08-18 ENCOUNTER — Encounter: Payer: Self-pay | Admitting: Neurology

## 2016-08-18 VITALS — BP 136/80 | HR 70 | Ht 67.0 in | Wt 286.0 lb

## 2016-08-18 DIAGNOSIS — F320 Major depressive disorder, single episode, mild: Secondary | ICD-10-CM | POA: Diagnosis not present

## 2016-08-18 DIAGNOSIS — I6310 Cerebral infarction due to embolism of unspecified precerebral artery: Secondary | ICD-10-CM

## 2016-08-18 NOTE — Patient Instructions (Signed)
1. Continue all your medications 2. For any significant change in symptoms, go to ER immediately 3. Follow-up in 1 year, call for any changes

## 2016-08-18 NOTE — Progress Notes (Signed)
NEUROLOGY FOLLOW UP OFFICE NOTE  Cindy Faulkner [id number] 06/24/1965  HISTORY OF PRESENT ILLNESS: I had the pleasure of seeing Cindy Faulkner in follow-up in the neurology clinic on 08/18/2016.  The patient was last seen 6 months ago after a left internal capsule stroke in October 2017 felt due to atrial fibrillation, now on Xarelto. She was reporting memory and personality changes and was started on citalopram. She thinks she is back to baseline except her energy level is still not fully back. She wants to do more things. Citalopram dose was increased by her PCP last March. She is back to work without difficulties, memory is better. She denies any focal numbness/tingling/weakness. She has occasional minor headaches. She has vertigo when turning her head to the left in bed. She had some knee pain that is better after injections. She denies any falls.   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 They noticed a left facial droop and convinced her to go to the ER. On exam, she was noted to be slow to respond but able to answer questions correctly. No focal weakness seen. She reported "a little blurry vision in her left eye." She was transferred to [facility] where MRI/MRA brain without contrast was done which I personally reviewed, there was a small acute infarct in the genu of the left internal capsule. There was mild to moderate narrowing of the left ICA clinoid segment and carotid  terminus, with mild narrowing of the proximal right MCA, mild narrowing of the distal cervical left ICA. Echo showed normal EF 60-65%, left atrium moderately dilated. Carotid doppler showed intimal thickening CCA, mild soft plaque at the origin of bilateral ICA with 1-39% stenosis. Lipid panel showed an LDL of 159, total cholesterol 224. HbA1c 5.8. Stroke was felt to be embolic from atrial fibrillation, she was started on Xarelto. Over the course of her stay, she started feeling better except for still some memory difficulties. She denied any associated headache, dizziness, diplopia, vision loss, dysarthria/dyspahgia, focal numbness/tingling/weakness.   The first week home she was very physically tired and wanted to sleep all the time. She started feeling better the second week, and was back to work the third week. She however has noticed that she is very tired once she gets home. Her daughter has noticed this as well. Her husband has told her of short term memory loss, she thinks she is okay. She denies any difficulties at work. She denies getting lost driving. She is slacking on bill payments which she is usually on top of, stating "if they get paid, they get paid, I just don't care." No missed medications. Her daughter has noticed similar changes at home as well, she can tell a big difference, stating her mother is "not the same person at all." She used to be very social, now she just sits and has people come talk to her. Her daughter would tell her something 2 days prior and she  would have no recollection of this. She used to be very active on her day off, but now would stay all day instead of do errands. She states that she "does not care about anything." For instance, her family had to strongly encourage her to put up their tree, which she usually did without prompting. She only decorated it halfway through and "did not care to finish it." Recently she has been tossing and turning in sleep, feels rested  with her CPAP but states she is always tired. She has gained 80 lbs in the past 1.5 years. She has occasional dizziness with spinning sensation on head turn to the left when lying down. She usually waits a minute and the dizziness passes, no nausea/vomiting, vision changes, neck/back pain, bowel/bladder dysfunction. There is a family history of stroke in both parents. Her mother and 3 siblings had memory issues. She denies any head injuries, tobacco or alcohol intake.   PAST MEDICAL HISTORY: Past Medical History:  Diagnosis Date  . Atrial fibrillation ([place])    a. echo 4/07: EF 60%  . Atrial tachycardia ([place])    a. s/p EPS 4/09: no inducible SVT - med Tx continued  . Complication of anesthesia    age 51yo, woke during procedure with GA, paralysed   . Endometrial polyp   . Hypertension   . PONV (postoperative nausea and vomiting)   . Rectocele    WITHOUT MENTION OF UTERINE PROLAPSE  . SVD (spontaneous vaginal delivery)    x 1    MEDICATIONS: Current Outpatient Prescriptions on File Prior to Visit  Medication Sig Dispense Refill  . ALPRAZolam (XANAX) 0.25 MG tablet Take 0.25 mg by mouth at bedtime as needed for sleep.     Marland Kitchen atorvastatin (LIPITOR) 40 MG tablet Take 1 tablet (40 mg total) by mouth daily at 6 PM. 30 tablet 0  . Biotin 1000 MCG tablet Take 1,000 mcg by mouth daily.    . cholecalciferol (VITAMIN D) 1000 units tablet Take 2,000 Units by mouth daily.    . citalopram (CELEXA) 20 MG tablet Take 20 mg by mouth daily.    . flecainide (TAMBOCOR) 50 MG tablet Take 1 tablet (50 mg total) by mouth daily. 90 tablet 3  . furosemide (LASIX) 40 MG tablet Take 1 tablet (40 mg total) by mouth 2 (two) times daily. (Patient taking differently: Take 40 mg by mouth daily. ) 180 tablet 3  . Glucosamine-Chondroitin (GLUCOSAMINE CHONDR COMPLEX PO) Take 2 capsules by mouth daily.     . metoprolol (LOPRESSOR) 50 MG tablet Take 1 tablet (50 mg total) by mouth daily. *Please call and schedule a one year  follow up appointment* 90 tablet 0  . Multiple Vitamin (MULTIVITAMIN) tablet Take 2 tablets by mouth daily.     . Omega-3 Fatty Acids (FISH OIL) 1000 MG CAPS Take 2 capsules by mouth daily.      . rivaroxaban (XARELTO) 20 MG TABS tablet Take 1 tablet (20 mg total) by mouth daily. 30 tablet 0  . Turmeric (CURCUMIN 95) 500 MG CAPS Take 3 capsules by mouth daily.      No current facility-administered medications on file prior to visit.     ALLERGIES: No Known Allergies  FAMILY HISTORY: Family History  Problem Relation Age of Onset  . Hypertension Father   . Diabetes Father   . Stroke Father   . Hypertension Mother   . Stroke Mother     SOCIAL HISTORY: Social History   Social History  .  Marital status: Married    Spouse name: N/A  . Number of children: N/A  . Years of education: N/A   Occupational History  . Not on file.   Social History Main Topics  . Smoking status: Former Smoker    Packs/day: 0.50    Years: 10.00    Types: Cigarettes    Quit date: 02/22/1998  . Smokeless tobacco: Never Used  . Alcohol use No  . Drug use: No  . Sexual activity: Not on file   Other Topics Concern  . Not on file   Social History Narrative  . No narrative on file    REVIEW OF SYSTEMS: Constitutional: No fevers, chills, or sweats, no generalized fatigue, change in appetite Eyes: No visual changes, double vision, eye pain Ear, nose and throat: No hearing loss, ear pain, nasal congestion, sore throat Cardiovascular: No chest pain, palpitations Respiratory:  No shortness of breath at rest or with exertion, wheezes GastrointestinaI: No nausea, vomiting, diarrhea, abdominal pain, fecal incontinence Genitourinary:  No dysuria, urinary retention or frequency Musculoskeletal:  No neck pain, back pain Integumentary: No rash, pruritus, skin lesions Neurological: as above Psychiatric: No depression, insomnia, anxiety Endocrine: No palpitations, fatigue, diaphoresis, mood swings, change in  appetite, change in weight, increased thirst Hematologic/Lymphatic:  No anemia, purpura, petechiae. Allergic/Immunologic: no itchy/runny eyes, nasal congestion, recent allergic reactions, rashes  PHYSICAL EXAM: Vitals:   08/18/16 1001  BP: 136/80  Pulse: 70   General: No acute distress Head:  Normocephalic/atraumatic Neck: supple, no paraspinal tenderness, full range of motion Heart:  Regular rate and rhythm Lungs:  Clear to auscultation bilaterally Back: No paraspinal tenderness Skin/Extremities: No rash, no edema Neurological Exam: alert and oriented to person, place, and time. No aphasia or dysarthria. Fund of knowledge is appropriate.  Recent and remote memory are intact. 2/3 delayed recall.  Attention and concentration are normal.    Able to name objects and repeat phrases. Cranial nerves: Pupils equal, round, reactive to light.  Extraocular movements intact with no nystagmus. Visual fields full. Facial sensation intact. No facial asymmetry. Tongue, uvula, palate midline.  Motor: Bulk and tone normal, muscle strength 5/5 throughout with no pronator drift.  Sensation to light touch intact.  No extinction to double simultaneous stimulation.  Deep tendon reflexes 2+ throughout, toes downgoing.  Finger to nose testing intact.  Gait narrow-based and steady, able to tandem walk adequately.  Romberg negative.  IMPRESSION: This is a pleasant 51 yo RH woman with a history of hypertension, paroxysmal atrial fibrillation, in her usual state of health until 12/05/15 when she started acting oddly at work. She has minimal recollection of this. She was found to have a small acute infarct in the genu of the left internal capsule. There was mild to moderate narrowing of the left ICA clinoid segment and carotid terminus, with mild narrowing of the proximal right MCA, mild narrowing of the distal cervical left ICA. Echo showed normal EF 60-65%, left atrium moderately dilated. Carotid doppler showed intimal  thickening CCA, mild soft plaque at the origin of bilateral ICA with 1-39% stenosis. Stroke was felt to be embolic from atrial fibrillation, she is now on Xarelto. She had memory and cognitive changes suggestive of post-stroke depression, and is doing much better on citalopram 20mg  daily. Her neurological exam is normal. We again discussed secondary stroke prevention with control of vascular risk factors. She knows to go to the ER for any changes in symptoms and will follow-up in 1 year.  Thank you for  allowing me to participate in her care.  Please do not hesitate to call for any questions or concerns.  The duration of this appointment visit was 15 minutes of face-to-face time with the patient.  Greater than 50% of this time was spent in counseling, explanation of diagnosis, planning of further management, and coordination of care.   Ellouise Newer, M.D.   CC: Lennie Odor, PA-C

## 2016-08-23 DIAGNOSIS — F320 Major depressive disorder, single episode, mild: Secondary | ICD-10-CM | POA: Insufficient documentation

## 2016-08-30 DIAGNOSIS — G4733 Obstructive sleep apnea (adult) (pediatric): Secondary | ICD-10-CM | POA: Diagnosis not present

## 2016-09-03 ENCOUNTER — Other Ambulatory Visit: Payer: Self-pay | Admitting: Internal Medicine

## 2016-09-03 MED FILL — FLECAINIDE ACETATE 50 MG TA: 50 | 30 days supply | Qty: 30 | Fill #0

## 2016-09-03 MED FILL — METOPROLOL TARTRATE 50 MG T: 50 | 30 days supply | Qty: 30 | Fill #0

## 2016-09-14 ENCOUNTER — Ambulatory Visit (INDEPENDENT_AMBULATORY_CARE_PROVIDER_SITE_OTHER): Payer: 59 | Admitting: Internal Medicine

## 2016-09-14 ENCOUNTER — Encounter: Payer: Self-pay | Admitting: Internal Medicine

## 2016-09-14 VITALS — BP 132/86 | HR 56 | Ht 67.0 in | Wt 288.0 lb

## 2016-09-14 DIAGNOSIS — I471 Supraventricular tachycardia: Secondary | ICD-10-CM

## 2016-09-14 MED ORDER — FLECAINIDE ACETATE 50 MG PO TABS: 50.0000 mg | ORAL_TABLET | Freq: Every day | ORAL | 3 refills | Status: DC

## 2016-09-14 MED ORDER — METOPROLOL TARTRATE 50 MG PO TABS: 50.0000 mg | ORAL_TABLET | Freq: Every day | ORAL | 3 refills | Status: DC

## 2016-09-14 NOTE — Patient Instructions (Signed)
Medication Instructions:  Your physician recommends that you continue on your current medications as directed. Please refer to the Current Medication list given to you today.   Labwork: None ordered.   Testing/Procedures: None ordered.   Follow-Up: Your physician wants you to follow-up in: one year follow up with Dr. Lovena Le.   You will receive a reminder letter in the mail two months in advance. If you don't receive a letter, please call our office to schedule the follow-up appointment.   Any Other Special Instructions Will Be Listed Below (If Applicable).     If you need a refill on your cardiac medications before your next appointment, please call your pharmacy.

## 2016-09-14 NOTE — Progress Notes (Signed)
HPI Cindy Faulkner returns today for followup. She is a very pleasant 51 year old woman with a history of hypertension, obesity, paroxysmal atrial fibrillation and atrial tachycardia, and dyslipidemia. In the interim she has been stable. Unfortunately, she has gained much of her weight back and when she did her HTN and palpitations returned and she is now back on her flecainide and her beta blocker. She had a mild stroke with no residual several months ago. Her CHADSVASC score was one but now with weight gain and development of hypertension and stroke is 4 and she is on Xarelto.  She has had some anxiety and depression. No Known Allergies   Current Outpatient Prescriptions  Medication Sig Dispense Refill  . ALPRAZolam (XANAX) 0.25 MG tablet Take 0.25 mg by mouth at bedtime as needed for sleep.     Marland Kitchen atorvastatin (LIPITOR) 40 MG tablet Take 1 tablet (40 mg total) by mouth daily at 6 PM. 30 tablet 0  . Biotin 1000 MCG tablet Take 1,000 mcg by mouth daily.    . cholecalciferol (VITAMIN D) 1000 units tablet Take 2,000 Units by mouth daily.    . citalopram (CELEXA) 20 MG tablet Take 20 mg by mouth daily.    . flecainide (TAMBOCOR) 50 MG tablet Take 1 tablet (50 mg total) by mouth daily. 90 tablet 3  . furosemide (LASIX) 40 MG tablet Take 40 mg by mouth daily.    . Glucosamine-Chondroitin (GLUCOSAMINE CHONDR COMPLEX PO) Take 2 capsules by mouth daily.     . metoprolol tartrate (LOPRESSOR) 50 MG tablet Take 1 tablet (50 mg total) by mouth daily. 90 tablet 3  . Multiple Vitamin (MULTIVITAMIN) tablet Take 2 tablets by mouth daily.     . Omega-3 Fatty Acids (FISH OIL) 1000 MG CAPS Take 2 capsules by mouth daily.      . rivaroxaban (XARELTO) 20 MG TABS tablet Take 1 tablet (20 mg total) by mouth daily. 30 tablet 0  . Turmeric (CURCUMIN 95) 500 MG CAPS Take 3 capsules by mouth daily.      No current facility-administered medications for this visit.      Past Medical History:  Diagnosis Date  . Atrial  fibrillation ([place])    a. echo 4/07: EF 60%  . Atrial tachycardia ([place])    a. s/p EPS 4/09: no inducible SVT - med Tx continued  . Complication of anesthesia    age 58yo, woke during procedure with GA, paralysed   . Endometrial polyp   . Hypertension   . PONV (postoperative nausea and vomiting)   . Rectocele    WITHOUT MENTION OF UTERINE PROLAPSE  . SVD (spontaneous vaginal delivery)    x 1    ROS:   All systems reviewed and negative except as noted in the HPI.   Past Surgical History:  Procedure Laterality Date  . COLONOSCOPY    . HYSTEROSCOPY    . LEEP    . Pylonidal cystect    . TUBAL LIGATION    . WISDOM TOOTH EXTRACTION       Family History  Problem Relation Age of Onset  . Hypertension Father   . Diabetes Father   . Stroke Father   . Hypertension Mother   . Stroke Mother      Social History   Social History  . Marital status: Married    Spouse name: N/A  . Number of children: N/A  . Years of education: N/A   Occupational History  . Not on file.  Social History Main Topics  . Smoking status: Former Smoker    Packs/day: 0.50    Years: 10.00    Types: Cigarettes    Quit date: 02/22/1998  . Smokeless tobacco: Never Used  . Alcohol use No  . Drug use: No  . Sexual activity: Not on file   Other Topics Concern  . Not on file   Social History Narrative  . No narrative on file     BP 132/86   Pulse (!) 56   Ht 5\' 7"  (1.702 m)   Wt 288 lb (130.6 kg)   SpO2 97%   BMI 45.11 kg/m   Physical Exam:  Well appearing obese, middle-age woman, NAD HEENT: Unremarkable Neck:  7 cm JVD, no thyromegally Back:  No CVA tenderness Lungs:  Clear with no wheezes, rales, or rhonchi. HEART:  Regular rate rhythm, no murmurs, no rubs, no clicks Abd:  soft, positive bowel sounds, no organomegally, no rebound, no guarding Ext:  2 plus pulses, no edema, no cyanosis, no clubbing Skin:  No rashes no nodules Neuro:  CN II through XII intact, motor grossly  intact  EKG - normal sinus rhythm   Assess/Plan: 1. Atrial tachy/fibrillation - she is doing well now that she is back on flecainide. She will continue her current meds including xarelto. 2. HTN - her blood pressure has been reasonably well controlled. She will continue her current meds 3. Obesity - we discussed the importance of weight loss.   Cindy Faulkner.D.

## 2016-09-15 DIAGNOSIS — F329 Major depressive disorder, single episode, unspecified: Secondary | ICD-10-CM | POA: Diagnosis not present

## 2016-09-15 DIAGNOSIS — I10 Essential (primary) hypertension: Secondary | ICD-10-CM | POA: Diagnosis not present

## 2016-09-15 DIAGNOSIS — E669 Obesity, unspecified: Secondary | ICD-10-CM | POA: Diagnosis not present

## 2016-09-15 DIAGNOSIS — Z8673 Personal history of transient ischemic attack (TIA), and cerebral infarction without residual deficits: Secondary | ICD-10-CM | POA: Diagnosis not present

## 2016-09-15 DIAGNOSIS — E7800 Pure hypercholesterolemia, unspecified: Secondary | ICD-10-CM | POA: Diagnosis not present

## 2016-09-15 MED FILL — CITALOPRAM HBR 20 MG TABLET: 20 | 90 days supply | Qty: 90 | Fill #0

## 2016-09-27 MED FILL — FLECAINIDE ACETATE 50 MG TA: 50 | 90 days supply | Qty: 90 | Fill #0

## 2016-09-27 MED FILL — METOPROLOL TARTRATE 50 MG T: 50 | 90 days supply | Qty: 90 | Fill #0

## 2016-09-29 DIAGNOSIS — R7301 Impaired fasting glucose: Secondary | ICD-10-CM | POA: Diagnosis not present

## 2016-09-29 DIAGNOSIS — R739 Hyperglycemia, unspecified: Secondary | ICD-10-CM | POA: Diagnosis not present

## 2016-10-28 ENCOUNTER — Other Ambulatory Visit: Payer: Self-pay | Admitting: Internal Medicine

## 2016-10-28 DIAGNOSIS — Z01419 Encounter for gynecological examination (general) (routine) without abnormal findings: Secondary | ICD-10-CM | POA: Diagnosis not present

## 2016-10-28 DIAGNOSIS — Z6841 Body Mass Index (BMI) 40.0 and over, adult: Secondary | ICD-10-CM | POA: Diagnosis not present

## 2016-10-28 MED FILL — FUROSEMIDE 40 MG TAB: 40 | 90 days supply | Qty: 180 | Fill #0

## 2016-10-28 MED FILL — XARELTO 20 MG TABLET: 20 | 90 days supply | Qty: 90 | Fill #0

## 2016-10-28 MED FILL — ATORVASTATIN 40 MG TABLET: 40 | 90 days supply | Qty: 90 | Fill #0

## 2016-10-28 NOTE — Telephone Encounter (Signed)
Per patient she takes 40 mg furosemide by mouth daily.

## 2016-10-28 NOTE — Telephone Encounter (Signed)
Should this be qd or bid? Pharmacy is requesting bid but current med list has qd. Looks like the change on her med list occurred at her last office visit with Dr Lovena Le but I do not see where he ordered for her to reduce her dose. Please advise. Thanks, MI

## 2016-12-08 DIAGNOSIS — G4733 Obstructive sleep apnea (adult) (pediatric): Secondary | ICD-10-CM | POA: Diagnosis not present

## 2016-12-24 MED FILL — CITALOPRAM HBR 20 MG TABLET: 20 | 90 days supply | Qty: 90 | Fill #1

## 2016-12-29 MED FILL — METOPROLOL TARTRATE 50 MG T: 50 | 90 days supply | Qty: 90 | Fill #1

## 2016-12-29 MED FILL — FLECAINIDE ACETATE 50 MG TA: 50 | 90 days supply | Qty: 90 | Fill #1

## 2017-01-20 MED FILL — ATORVASTATIN 40 MG TABLET: 40 | 90 days supply | Qty: 90 | Fill #1

## 2017-01-20 MED FILL — XARELTO 20 MG TABLET: 20 | 90 days supply | Qty: 90 | Fill #1

## 2017-01-20 MED FILL — FUROSEMIDE 40 MG TABS: 40 | 90 days supply | Qty: 180 | Fill #1

## 2017-01-26 DIAGNOSIS — M545 Low back pain: Secondary | ICD-10-CM | POA: Diagnosis not present

## 2017-02-04 DIAGNOSIS — M5417 Radiculopathy, lumbosacral region: Secondary | ICD-10-CM | POA: Diagnosis not present

## 2017-02-04 DIAGNOSIS — M5137 Other intervertebral disc degeneration, lumbosacral region: Secondary | ICD-10-CM | POA: Diagnosis not present

## 2017-02-04 DIAGNOSIS — M5416 Radiculopathy, lumbar region: Secondary | ICD-10-CM | POA: Diagnosis not present

## 2017-02-04 DIAGNOSIS — M5136 Other intervertebral disc degeneration, lumbar region: Secondary | ICD-10-CM | POA: Diagnosis not present

## 2017-02-04 MED FILL — ALPRAZolam 0.25 MG TABS: 0.25 | 10 days supply | Qty: 30 | Fill #0

## 2017-02-09 DIAGNOSIS — M545 Low back pain: Secondary | ICD-10-CM | POA: Diagnosis not present

## 2017-02-09 MED FILL — predniSONE 50 MG TABS: 50 | 7 days supply | Qty: 7 | Fill #0

## 2017-02-09 MED FILL — OXYCOD/ACETAMINOPHEN 5-325M: 5-325 | 7 days supply | Qty: 21 | Fill #0

## 2017-02-12 DIAGNOSIS — M545 Low back pain: Secondary | ICD-10-CM | POA: Diagnosis not present

## 2017-02-18 DIAGNOSIS — M545 Low back pain: Secondary | ICD-10-CM | POA: Diagnosis not present

## 2017-02-18 MED FILL — OXYCOD/ACETAMINOPHEN 5-325M: 5-325 | 7 days supply | Qty: 21 | Fill #0

## 2017-02-23 MED FILL — OXYCOD/ACETAMINOPHEN 5-325M: 5-325 | 7 days supply | Qty: 28 | Fill #0

## 2017-02-23 MED FILL — ALPRAZolam 0.25 MG TABS: 0.25 | 10 days supply | Qty: 30 | Fill #1

## 2017-02-23 MED FILL — DEXAMETHASONE 4 MG TABLET: 4 | 7 days supply | Qty: 7 | Fill #0

## 2017-02-28 ENCOUNTER — Emergency Department (HOSPITAL_COMMUNITY): Payer: 59

## 2017-02-28 ENCOUNTER — Other Ambulatory Visit: Payer: Self-pay

## 2017-02-28 ENCOUNTER — Emergency Department (HOSPITAL_COMMUNITY)
Admission: EM | Admit: 2017-02-28 | Discharge: 2017-02-28 | Disposition: A | Discharge status: Home / Self Care | Payer: 59 | Attending: Emergency Medicine | Admitting: Emergency Medicine

## 2017-02-28 ENCOUNTER — Encounter (HOSPITAL_COMMUNITY): Payer: Self-pay | Admitting: Emergency Medicine

## 2017-02-28 DIAGNOSIS — Z87891 Personal history of nicotine dependence: Secondary | ICD-10-CM | POA: Diagnosis not present

## 2017-02-28 DIAGNOSIS — Z7901 Long term (current) use of anticoagulants: Secondary | ICD-10-CM | POA: Diagnosis not present

## 2017-02-28 DIAGNOSIS — Z79899 Other long term (current) drug therapy: Secondary | ICD-10-CM | POA: Diagnosis not present

## 2017-02-28 DIAGNOSIS — Z8673 Personal history of transient ischemic attack (TIA), and cerebral infarction without residual deficits: Secondary | ICD-10-CM | POA: Insufficient documentation

## 2017-02-28 DIAGNOSIS — I10 Essential (primary) hypertension: Secondary | ICD-10-CM | POA: Diagnosis not present

## 2017-02-28 DIAGNOSIS — C7951 Secondary malignant neoplasm of bone: Secondary | ICD-10-CM | POA: Insufficient documentation

## 2017-02-28 DIAGNOSIS — M5442 Lumbago with sciatica, left side: Secondary | ICD-10-CM | POA: Diagnosis not present

## 2017-02-28 DIAGNOSIS — M545 Low back pain: Secondary | ICD-10-CM | POA: Diagnosis present

## 2017-02-28 DIAGNOSIS — R0602 Shortness of breath: Secondary | ICD-10-CM | POA: Diagnosis not present

## 2017-02-28 DIAGNOSIS — R222 Localized swelling, mass and lump, trunk: Secondary | ICD-10-CM | POA: Diagnosis not present

## 2017-02-28 LAB — COMPREHENSIVE METABOLIC PANEL
ALBUMIN: 3.2 g/dL — AB (ref 3.5–5.0)
ALK PHOS: 87 U/L (ref 38–126)
ALT: 24 U/L (ref 14–54)
AST: 24 U/L (ref 15–41)
Anion gap: 11 (ref 5–15)
BUN: 16 mg/dL (ref 6–20)
CHLORIDE: 98 mmol/L — AB (ref 101–111)
CO2: 23 mmol/L (ref 22–32)
Calcium: 8.9 mg/dL (ref 8.9–10.3)
Creatinine, Ser: 0.76 mg/dL (ref 0.44–1.00)
GFR calc Af Amer: >60 mL/min (ref >60)
GFR calc non Af Amer: >60 mL/min (ref >60)
GLUCOSE: 266 mg/dL — AB (ref 65–99)
Potassium: 4.2 mmol/L (ref 3.5–5.1)
SODIUM: 132 mmol/L — AB (ref 135–145)
Total Bilirubin: 0.5 mg/dL (ref 0.3–1.2)
Total Protein: 7 g/dL (ref 6.5–8.1)

## 2017-02-28 LAB — CBC WITH DIFFERENTIAL/PLATELET
BASOS PCT: 0 %
Basophils Absolute: 0 10*3/uL (ref 0.0–0.1)
EOS ABS: 0 10*3/uL (ref 0.0–0.7)
EOS PCT: 0 %
HCT: 37.8 % (ref 36.0–46.0)
Hemoglobin: 12.2 g/dL (ref 12.0–15.0)
Lymphocytes Relative: 28 %
Lymphs Abs: 3.7 10*3/uL (ref 0.7–4.0)
MCH: 27.4 pg (ref 26.0–34.0)
MCHC: 32.3 g/dL (ref 30.0–36.0)
MCV: 84.8 fL (ref 78.0–100.0)
MONOS PCT: 9 %
Monocytes Absolute: 1.2 10*3/uL — ABNORMAL HIGH (ref 0.1–1.0)
NEUTROS PCT: 63 %
Neutro Abs: 8.2 10*3/uL — ABNORMAL HIGH (ref 1.7–7.7)
PLATELETS: 364 10*3/uL (ref 150–400)
RBC: 4.46 MIL/uL (ref 3.87–5.11)
RDW: 14 % (ref 11.5–15.5)
WBC: 13.1 10*3/uL — AB (ref 4.0–10.5)

## 2017-02-28 MED ORDER — OXYCODONE HCL 5 MG PO TABS: 5.0000 mg | ORAL_TABLET | Freq: Four times a day (QID) | ORAL | 0 refills | Status: DC | PRN

## 2017-02-28 MED ORDER — HYDROMORPHONE HCL 1 MG/ML IJ SOLN
1.0000 mg | Freq: Once | INTRAMUSCULAR | Status: AC
  Administered 2017-02-28: 1 mg via INTRAVENOUS
  Filled 2017-02-28: qty 1

## 2017-02-28 MED ORDER — KETOROLAC TROMETHAMINE 30 MG/ML IJ SOLN
30.0000 mg | Freq: Once | INTRAMUSCULAR | Status: AC
  Administered 2017-02-28: 30 mg via INTRAVENOUS
  Filled 2017-02-28: qty 1

## 2017-02-28 MED ORDER — DIAZEPAM 5 MG/ML IJ SOLN
5.0000 mg | Freq: Once | INTRAMUSCULAR | Status: AC
  Administered 2017-02-28: 5 mg via INTRAVENOUS
  Filled 2017-02-28: qty 2

## 2017-02-28 MED FILL — HYDROmorphone HCL 2 MG TABS: 2 | 10 days supply | Qty: 80 | Fill #0

## 2017-02-28 MED FILL — GABAPENTIN 300 MG CAPS: 300 | 30 days supply | Qty: 90 | Fill #0

## 2017-02-28 NOTE — Discharge Instructions (Signed)
Return to the ER if your symptoms worsen, your pain is uncontrolled, or you have difficulty urinating or incontinence.  Please take pain medication as directed.

## 2017-02-28 NOTE — ED Triage Notes (Signed)
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  Pt has an appointment today at 4 w/ a neuro Dr.

## 2017-02-28 NOTE — ED Provider Notes (Signed)
[name] EMERGENCY DEPARTMENT Provider Note   CSN: [id number] Arrival date & time: 02/28/17  0235     History   Chief Complaint Chief Complaint  Patient presents with  . Back Pain    HPI Cindy Faulkner is a 52 y.o. female.  Patient presents to the ED with a chief complaint of low back pain.  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  She has involvement of the S1 and S2 nerve roots.  She reports that she has been trying to cover her pain with percocet prescribed by her doctor.  She states that this is no longer cutting it.  She states that she is scheduled to see neurosurgery this afternoon.  Additionally, she reports that she is having some signs of urinary retention, in that she has to push harder to empty her bladder.  She also reports that it takes more effort to move her left leg.  The left leg weakness is new x 3 days.   The history is provided by the patient. No language interpreter was used.    Past Medical History:  Diagnosis Date  . Atrial fibrillation ([place])    a. echo 4/07: EF 60%  . Atrial tachycardia ([place])    a. s/p EPS 4/09: no inducible SVT - med Tx continued  . Complication of anesthesia    age 22yo, woke during procedure with GA, paralysed   . Endometrial polyp   . Hypertension   . PONV (postoperative nausea and vomiting)   . Rectocele    WITHOUT MENTION OF UTERINE PROLAPSE  . SVD (spontaneous vaginal delivery)    x 1    Patient Active Problem List   Diagnosis Date Noted  . Mild single current episode of major depressive disorder ([place]) 08/23/2016  . Cerebral thrombosis with cerebral infarction 12/05/2015  . Stroke (cerebrum) ([place]) 12/05/2015  . Obstructive sleep apnea 07/24/2015  . Atrial tachycardia ([place]) 03/22/2014  . Morbid obesity ([place]) 03/22/2014  . Abscess of axilla, right 10/09/2013  . Preoperative evaluation to rule out surgical contraindication 10/04/2011  . Chest pain,  unspecified 06/24/2010  . Shortness of breath 06/24/2010  . Essential hypertension 10/20/2008  . RECTOCELE WITHOUT MENTION OF UTERINE PROLAPSE 10/20/2008  . ENDOMETRIAL POLYP 10/20/2008  . AF (paroxysmal atrial fibrillation) ([place]) 10/20/2008    Past Surgical History:  Procedure Laterality Date  . COLONOSCOPY    . HYSTEROSCOPY    . LEEP    . Pylonidal cystect    . TUBAL LIGATION    . WISDOM TOOTH EXTRACTION      OB History    No data available       Home Medications    Prior to Admission medications   Medication Sig Start Date End Date Taking? Authorizing Provider  ALPRAZolam Duanne Moron) 0.25 MG tablet Take 0.25 mg by mouth at bedtime as needed for sleep.     [provider]  atorvastatin (LIPITOR) 40 MG tablet Take 1 tablet (40 mg total) by mouth daily at 6 PM. 12/06/15   Dhungel, Nishant, MD  Biotin 1000 MCG tablet Take 1,000 mcg by mouth daily.    [provider]  cholecalciferol (VITAMIN D) 1000 units tablet Take 2,000 Units by mouth daily.    [provider]  citalopram (CELEXA) 20 MG tablet Take 20 mg by mouth daily.    [provider]  flecainide (TAMBOCOR) 50 MG tablet Take 1 tablet (50 mg total) by mouth daily.  09/14/16   Evans Lance, MD  furosemide (LASIX) 40 MG tablet Take 40 mg by mouth daily.    [provider]  furosemide (LASIX) 40 MG tablet TAKE 1 TABLET BY MOUTH TWICE A DAY 10/28/16   Evans Lance, MD  Glucosamine-Chondroitin (GLUCOSAMINE CHONDR COMPLEX PO) Take 2 capsules by mouth daily.     [provider]  metoprolol tartrate (LOPRESSOR) 50 MG tablet Take 1 tablet (50 mg total) by mouth daily. 09/14/16   Evans Lance, MD  Multiple Vitamin (MULTIVITAMIN) tablet Take 2 tablets by mouth daily.     [provider]  Omega-3 Fatty Acids (FISH OIL) 1000 MG CAPS Take 2 capsules by mouth daily.      [provider]  rivaroxaban (XARELTO) 20 MG TABS tablet Take 1 tablet (20 mg total) by mouth  daily. 12/06/15   Dhungel, Nishant, MD  Turmeric (CURCUMIN 95) 500 MG CAPS Take 3 capsules by mouth daily.     [provider]    Family History Family History  Problem Relation Age of Onset  . Hypertension Father   . Diabetes Father   . Stroke Father   . Hypertension Mother   . Stroke Mother     Social History Social History   Tobacco Use  . Smoking status: Former Smoker    Packs/day: 0.50    Years: 10.00    Pack years: 5.00    Types: Cigarettes    Last attempt to quit: 02/22/1998    Years since quitting: 19.0  . Smokeless tobacco: Never Used  Substance Use Topics  . Alcohol use: No  . Drug use: No     Allergies   Patient has no known allergies.   Review of Systems Review of Systems  All other systems reviewed and are negative.    Physical Exam Updated Vital Signs BP (!) 192/106 (BP Location: Right Arm)   Pulse 87   Temp 98.7 F (37.1 C) (Oral)   Resp 16   Ht 5\' 7"  (1.702 m)   Wt 113.4 kg (250 lb)   SpO2 99%   BMI 39.16 kg/m   Physical Exam Physical Exam  Constitutional: Pt appears well-developed and well-nourished. No distress.  HENT:  Head: Normocephalic and atraumatic.  Mouth/Throat: Oropharynx is clear and moist. No oropharyngeal exudate.  Eyes: Conjunctivae are normal.  Neck: Normal range of motion. Neck supple.  No meningismus Cardiovascular: Normal rate, regular rhythm and intact distal pulses.   Pulmonary/Chest: Effort normal and breath sounds normal. No respiratory distress. Pt has no wheezes.  Abdominal: Pt exhibits no distension Musculoskeletal:  Moderate lumbar paraspinal muscle tenderness to palpation, no bony CTLS spine tenderness, deformity, step-off, or crepitus Lymphadenopathy: Pt has no cervical adenopathy.  Neurological: Pt is alert and oriented Speech is clear and goal oriented, follows commands Normal 5/5 strength in upper and lower extremities bilaterally including dorsiflexion and plantar flexion, strong and equal  grip strength Sensation intact Great toe extension intact Moves extremities without ataxia, coordination intact Ankle and knee jerk reflexes intact and symmetrical  Normal gait Normal balance No Clonus Skin: Skin is warm and dry. No rash noted. Pt is not diaphoretic. No erythema.  Psychiatric: Pt has a normal mood and affect. Behavior is normal.  Nursing note and vitals reviewed.   ED Treatments / Results  Labs (all labs ordered are listed, but only abnormal results are displayed) Labs Reviewed - No data to display  EKG  EKG Interpretation None  Radiology No results found.  Procedures Procedures (including critical care time)  Medications Ordered in ED Medications  HYDROmorphone (DILAUDID) injection 1 mg (not administered)     Initial Impression / Assessment and Plan / ED Course  I have reviewed the triage vital signs and the nursing notes.  Pertinent labs & imaging results that were available during my care of the patient were reviewed by me and considered in my medical decision making (see chart for details).     Patient with recently diagnosed metastatic lesion in sacrum.  She has follow-up with neurosurgery today.  She is here for pain control.    Seen by and discussed with Dr. Venora Maples.  No new symptoms today requiring emergent intervention.  Will attempt to achieve better pain control and plan to have patient follow-up with neurosurgery today.  4:54 AM 7/10 pain after (2) 1 mg rounds of dilaudid and 5 mg of valium.  5:51 AM Pain is now at a 4.  Patient feels comfortable with discharge.  She has ambulated in the ED.  Bladder scan was <250 ml, no evidence of retention.    Patient has follow-up with Dr. Ronnald Ramp from neurosurgery today.  She will return if her symptoms worsen.     Final Clinical Impressions(s) / ED Diagnoses   Final diagnoses:  Acute left-sided low back pain with left-sided sciatica    ED Discharge Orders        Ordered    oxyCODONE  (ROXICODONE) 5 MG immediate release tablet  Every 6 hours PRN     02/28/17 0551       Montine Circle, PA-C 02/28/17 4469    Jola Schmidt, MD 02/28/17 [phone]

## 2017-02-28 NOTE — ED Notes (Signed)
ED Provider at bedside. 

## 2017-03-01 ENCOUNTER — Telehealth: Payer: Self-pay | Admitting: Hematology and Oncology

## 2017-03-01 ENCOUNTER — Telehealth: Payer: Self-pay | Admitting: *Deleted

## 2017-03-01 NOTE — Telephone Encounter (Signed)
Left message to see if she can come see Dr Alvy Bimler on Wednesday @ 1100.

## 2017-03-01 NOTE — Telephone Encounter (Signed)
Spoke with patient regarding appointment for 03/02/17 per NG

## 2017-03-02 ENCOUNTER — Encounter: Payer: Self-pay | Admitting: Hematology and Oncology

## 2017-03-02 ENCOUNTER — Inpatient Hospital Stay: Payer: 59 | Attending: Hematology and Oncology | Admitting: Hematology and Oncology

## 2017-03-02 DIAGNOSIS — R1011 Right upper quadrant pain: Secondary | ICD-10-CM | POA: Diagnosis not present

## 2017-03-02 DIAGNOSIS — Z87891 Personal history of nicotine dependence: Secondary | ICD-10-CM | POA: Insufficient documentation

## 2017-03-02 DIAGNOSIS — Z801 Family history of malignant neoplasm of trachea, bronchus and lung: Secondary | ICD-10-CM | POA: Insufficient documentation

## 2017-03-02 DIAGNOSIS — C539 Malignant neoplasm of cervix uteri, unspecified: Secondary | ICD-10-CM | POA: Diagnosis not present

## 2017-03-02 DIAGNOSIS — I4891 Unspecified atrial fibrillation: Secondary | ICD-10-CM | POA: Diagnosis not present

## 2017-03-02 DIAGNOSIS — Z7901 Long term (current) use of anticoagulants: Secondary | ICD-10-CM | POA: Insufficient documentation

## 2017-03-02 DIAGNOSIS — G893 Neoplasm related pain (acute) (chronic): Secondary | ICD-10-CM | POA: Insufficient documentation

## 2017-03-02 DIAGNOSIS — C787 Secondary malignant neoplasm of liver and intrahepatic bile duct: Secondary | ICD-10-CM | POA: Insufficient documentation

## 2017-03-02 DIAGNOSIS — M533 Sacrococcygeal disorders, not elsewhere classified: Secondary | ICD-10-CM

## 2017-03-02 DIAGNOSIS — R1907 Generalized intra-abdominal and pelvic swelling, mass and lump: Secondary | ICD-10-CM | POA: Insufficient documentation

## 2017-03-02 DIAGNOSIS — R300 Dysuria: Secondary | ICD-10-CM | POA: Insufficient documentation

## 2017-03-02 DIAGNOSIS — R222 Localized swelling, mass and lump, trunk: Secondary | ICD-10-CM

## 2017-03-02 DIAGNOSIS — Z808 Family history of malignant neoplasm of other organs or systems: Secondary | ICD-10-CM | POA: Diagnosis not present

## 2017-03-02 DIAGNOSIS — R1900 Intra-abdominal and pelvic swelling, mass and lump, unspecified site: Secondary | ICD-10-CM | POA: Diagnosis not present

## 2017-03-02 MED ORDER — MORPHINE SULFATE ER 30 MG PO TBCR: 30.0000 mg | EXTENDED_RELEASE_TABLET | Freq: Three times a day (TID) | ORAL | 0 refills | Status: DC

## 2017-03-02 MED FILL — MORPHINE SULF ER 30 MG TAB: 30 | 30 days supply | Qty: 90 | Fill #0

## 2017-03-02 NOTE — Assessment & Plan Note (Signed)
She has severe, uncontrolled cancer pain. We discussed pain management I will start her on MS Contin 30 mg 3 times a day.  She would continue to use her oxycodone or Dilaudid for breakthrough pain medicine I warned her about risk of nausea, sedation and constipation. She can continue to take ibuprofen as needed

## 2017-03-02 NOTE — Progress Notes (Signed)
[facility] CONSULT NOTE  Patient Care Team: Cleda Mccreedy as PCP - General (Nurse Practitioner) Arvella Nigh, MD as Obstetrician (Obstetrics and Gynecology) Evans Lance, MD as Consulting Physician (Cardiology)  CHIEF COMPLAINTS/PURPOSE OF CONSULTATION:  Left presacral mass, worrisome for malignancy, uncontrolled cancer pain  HISTORY OF PRESENTING ILLNESS:  Cindy Faulkner 52 y.o. female is seen urgently due to newly diagnosed presacral mass Her husband and daughter are present. Her symptoms began approximately 6 weeks ago with lower sacroiliac pain radiating down to the left leg.  The pain is intermittent in nature but has gotten progressively more intense and more persistent.  At worst, she rated her pain at 10 out of 10. She was prescribed Percocet and Dilaudid that did not seem to help with the pain She needs to take multiple doses frequently to get the pain under reasonably controlled. She also had mild urinary retention. At first, she saw a chiropractor for approximately 1 month with no relief. She was subsequently referred to see an orthopedic surgeon who prescribed injection that did not help.  Ultimately, he prescribed 50 mg of prednisone for 5 days which also did not help. MRI was performed initially without contrast and subsequently with contrast which I have reviewed personally which showed large presacral mass with bony invasion. She has severe, uncontrolled pain culminating to an ER visit for pain management. She is being referred here urgently for further evaluation She complained of mild weakness and difficulties with walking because of this pain. She has tried ibuprofen and Flexeril along with heat pad and multiple other ways to try to help with the pain without relief The patient has history of abnormal Pap smears status post LEEP procedure She denies further abnormal Pap smear since then She has history of atrial fibrillation and was on medications  for it. A year ago, she developed acute confusion and dysarthria and was diagnosed with stroke without residual permanent deficit. She is on chronic anticoagulation therapy for this   She denies new lymphadenopathy.  Her appetite is stable, no abnormal weight loss She is up-to-date with screening modalities.  MEDICAL HISTORY:  Past Medical History:  Diagnosis Date  . Atrial fibrillation ([place])    a. echo 4/07: EF 60%  . Atrial tachycardia ([place])    a. s/p EPS 4/09: no inducible SVT - med Tx continued  . Complication of anesthesia    age 38yo, woke during procedure with GA, paralysed   . Endometrial polyp   . Hyperlipidemia   . Hypertension   . PONV (postoperative nausea and vomiting)   . Rectocele    WITHOUT MENTION OF UTERINE PROLAPSE  . Stroke ([place])   . SVD (spontaneous vaginal delivery)    x 1    SURGICAL HISTORY: Past Surgical History:  Procedure Laterality Date  . COLONOSCOPY    . HYSTEROSCOPY    . LEEP    . Pylonidal cystect    . TUBAL LIGATION    . WISDOM TOOTH EXTRACTION      SOCIAL HISTORY: Social History   Socioeconomic History  . Marital status: Married    Spouse name: Kasandra Knudsen  . Number of children: 1  . Years of education: Not on file  . Highest education level: Not on file  Social Needs  . Financial resource strain: Not on file  . Food insecurity - worry: Not on file  . Food insecurity - inability: Not on file  . Transportation needs - medical: Not on file  .  Transportation needs - non-medical: Not on file  Occupational History  . Occupation: nurse  Tobacco Use  . Smoking status: Former Smoker    Packs/day: 0.50    Years: 10.00    Pack years: 5.00    Types: Cigarettes    Last attempt to quit: 02/22/1998    Years since quitting: 19.0  . Smokeless tobacco: Never Used  Substance and Sexual Activity  . Alcohol use: No  . Drug use: No  . Sexual activity: Not on file  Other Topics Concern  . Not on file  Social History Narrative  . Not on file     FAMILY HISTORY: Family History  Problem Relation Age of Onset  . Hypertension Father   . Diabetes Father   . Stroke Father   . Hypertension Mother   . Stroke Mother   . Cancer Mother        cervical ca  . Cancer Sister 34       uterine ca  . Cancer Maternal Grandmother        lung ca    ALLERGIES:  has No Known Allergies.  MEDICATIONS:  Current Outpatient Medications  Medication Sig Dispense Refill  . gabapentin (NEURONTIN) 300 MG capsule Take 300 mg by mouth 3 (three) times daily.  1  . HYDROmorphone (DILAUDID) 2 MG tablet Take 1 tablet by mouth every 4 (four) hours as needed for pain.  0  . ALPRAZolam (XANAX) 0.25 MG tablet Take 0.25 mg by mouth at bedtime as needed for sleep.     Marland Kitchen atorvastatin (LIPITOR) 40 MG tablet Take 1 tablet (40 mg total) by mouth daily at 6 PM. 30 tablet 0  . Biotin 1000 MCG tablet Take 1,000 mcg by mouth daily.    . cholecalciferol (VITAMIN D) 1000 units tablet Take 2,000 Units by mouth daily.    . citalopram (CELEXA) 20 MG tablet Take 20 mg by mouth daily.    . flecainide (TAMBOCOR) 50 MG tablet Take 1 tablet (50 mg total) by mouth daily. 90 tablet 3  . furosemide (LASIX) 40 MG tablet Take 40 mg by mouth daily.    . Glucosamine-Chondroitin (GLUCOSAMINE CHONDR COMPLEX PO) Take 2 capsules by mouth daily.     . metoprolol tartrate (LOPRESSOR) 50 MG tablet Take 1 tablet (50 mg total) by mouth daily. 90 tablet 3  . morphine (MS CONTIN) 30 MG 12 hr tablet Take 1 tablet (30 mg total) by mouth every 8 (eight) hours. 90 tablet 0  . Multiple Vitamin (MULTIVITAMIN) tablet Take 2 tablets by mouth daily.     . Omega-3 Fatty Acids (FISH OIL) 1000 MG CAPS Take 2 capsules by mouth daily.      Marland Kitchen oxyCODONE (ROXICODONE) 5 MG immediate release tablet Take 1 tablet (5 mg total) by mouth every 6 (six) hours as needed for severe pain. 10 tablet 0  . rivaroxaban (XARELTO) 20 MG TABS tablet Take 1 tablet (20 mg total) by mouth daily. 30 tablet 0  . Turmeric (CURCUMIN  95) 500 MG CAPS Take 3 capsules by mouth daily.      No current facility-administered medications for this visit.     REVIEW OF SYSTEMS:   Constitutional: Denies fevers, chills or abnormal night sweats Eyes: Denies blurriness of vision, double vision or watery eyes Ears, nose, mouth, throat, and face: Denies mucositis or sore throat Respiratory: Denies cough, dyspnea or wheezes Cardiovascular: Denies palpitation, chest discomfort or lower extremity swelling Gastrointestinal:  Denies nausea, heartburn or change in bowel  habits Skin: Denies abnormal skin rashes Lymphatics: Denies new lymphadenopathy or easy bruising Behavioral/Psych: Mood is stable, no new changes  All other systems were reviewed with the patient and are negative.  PHYSICAL EXAMINATION: ECOG PERFORMANCE STATUS: 2 - Symptomatic, <50% confined to bed  Vitals:   03/02/17 1127  BP: 128/78  Pulse: 73  Resp: 18  Temp: 98 F (36.7 C)  SpO2: 97%   Filed Weights   03/02/17 1127  Weight: 278 lb 14.4 oz (126.5 kg)    GENERAL:alert, no distress and comfortable.  She is in obvious distress from pain SKIN: skin color, texture, turgor are normal, no rashes or significant lesions EYES: normal, conjunctiva are pink and non-injected, sclera clear OROPHARYNX:no exudate, no erythema and lips, buccal mucosa, and tongue normal  NECK: supple, thyroid normal size, non-tender, without nodularity LYMPH:  no palpable lymphadenopathy in the cervical, axillary or inguinal LUNGS: clear to auscultation and percussion with normal breathing effort HEART: regular rate & rhythm and no murmurs and no lower extremity edema ABDOMEN:abdomen soft, non-tender and normal bowel sounds Musculoskeletal:no cyanosis of digits and no clubbing.  She have limitation of movement on the left lower extremity due to pain.   PSYCH: alert & oriented x 3 with fluent speech NEURO: no focal motor/sensory deficits.  Full neurological exam is difficult due to pain.   Her gait is unstable due to pain  LABORATORY DATA:  I have reviewed the data as listed Lab Results  Component Value Date   WBC 13.1 (H) 02/28/2017   HGB 12.2 02/28/2017   HCT 37.8 02/28/2017   MCV 84.8 02/28/2017   PLT 364 02/28/2017   Recent Labs    02/28/17 0359  NA 132*  K 4.2  CL 98*  CO2 23  GLUCOSE 266*  BUN 16  CREATININE 0.76  CALCIUM 8.9  GFRNONAA >60  GFRAA >60  PROT 7.0  ALBUMIN 3.2*  AST 24  ALT 24  ALKPHOS 87  BILITOT 0.5    RADIOGRAPHIC STUDIES: I have personally reviewed her outside MRI I have personally reviewed the radiological images as listed and agreed with the findings in the report. Dg Chest 2 View  Result Date: 02/28/2017 CLINICAL DATA:  52 y/o  F; shortness of breath. EXAM: CHEST  2 VIEW COMPARISON:  02/18/2012 chest radiograph FINDINGS: Stable heart size and mediastinal contours are within normal limits. Minor atelectasis in lung bases. No consolidation. The visualized skeletal structures are unremarkable. IMPRESSION: Minor atelectasis in lung bases.  No consolidation. Electronically Signed   By: Kristine Garbe M.D.   On: 02/28/2017 04:39    ASSESSMENT & PLAN:  Sacral mass I have reviewed her MRI imaging myself. She has abnormal left pre-sacral mass with bony involvement of the left side. It is definitely encasing to the nerve root. Differential diagnosis could be possible diagnosis of lymphoma versus multiple myeloma or other potential causes. The patient has history of abnormal Pap smear.  Cervical cancer with localized disease is a possibility I recommend CT scan of the chest, abdomen and pelvis with contrast for further evaluation. If CT scan is unrevealing, PET/CT scan can be considered I would reviewed the imaging study with the radiologist to determine the best course of action and the best approach to get tissue biopsy We will bring her back next week for further management.  Cancer associated pain She has severe,  uncontrolled cancer pain. We discussed pain management I will start her on MS Contin 30 mg 3 times a day.  She  would continue to use her oxycodone or Dilaudid for breakthrough pain medicine I warned her about risk of nausea, sedation and constipation. She can continue to take ibuprofen as needed  Orders Placed This Encounter  Procedures  . CT ABDOMEN PELVIS W CONTRAST    Standing Status:   Future    Standing Expiration Date:   03/02/2018    Order Specific Question:   If indicated for the ordered procedure, I authorize the administration of contrast media per Radiology protocol    Answer:   Yes    Order Specific Question:   Preferred imaging location?    Answer:   [facility]    Order Specific Question:   Radiology Contrast Protocol - do NOT remove file path    Answer:   [email or web address]    Order Specific Question:   Reason for Exam additional comments    Answer:   pelvic mass    Order Specific Question:   Is patient pregnant?    Answer:   No  . CT CHEST W CONTRAST    Standing Status:   Future    Standing Expiration Date:   03/02/2018    Order Specific Question:   If indicated for the ordered procedure, I authorize the administration of contrast media per Radiology protocol    Answer:   Yes    Order Specific Question:   Preferred imaging location?    Answer:   [facility]    Order Specific Question:   Radiology Contrast Protocol - do NOT remove file path    Answer:   [email or web address]    Order Specific Question:   Is patient pregnant?    Answer:   No    All questions were answered. The patient knows to call the clinic with any problems, questions or concerns. I spent 60 minutes counseling the patient face to face. The total time spent in the appointment was 80 minutes and more than 50% was on counseling.     [name], MD 03/02/2017 3:15 PM

## 2017-03-02 NOTE — Assessment & Plan Note (Signed)
I have reviewed her MRI imaging myself. She has abnormal left pre-sacral mass with bony involvement of the left side. It is definitely encasing to the nerve root. Differential diagnosis could be possible diagnosis of lymphoma versus multiple myeloma or other potential causes. The patient has history of abnormal Pap smear.  Cervical cancer with localized disease is a possibility I recommend CT scan of the chest, abdomen and pelvis with contrast for further evaluation. If CT scan is unrevealing, PET/CT scan can be considered I would reviewed the imaging study with the radiologist to determine the best course of action and the best approach to get tissue biopsy We will bring her back next week for further management.

## 2017-03-03 ENCOUNTER — Telehealth: Payer: Self-pay

## 2017-03-03 ENCOUNTER — Other Ambulatory Visit: Payer: Self-pay | Admitting: Hematology and Oncology

## 2017-03-03 ENCOUNTER — Ambulatory Visit
Admission: RE | Admit: 2017-03-03 | Discharge: 2017-03-03 | Disposition: A | Discharge status: Home / Self Care | Payer: Self-pay | Source: Ambulatory Visit | Attending: Hematology and Oncology | Admitting: Hematology and Oncology

## 2017-03-03 ENCOUNTER — Ambulatory Visit (HOSPITAL_COMMUNITY)
Admission: RE | Admit: 2017-03-03 | Discharge: 2017-03-03 | Disposition: A | Discharge status: Home / Self Care | Payer: 59 | Source: Ambulatory Visit | Attending: Hematology and Oncology | Admitting: Hematology and Oncology

## 2017-03-03 DIAGNOSIS — C801 Malignant (primary) neoplasm, unspecified: Secondary | ICD-10-CM

## 2017-03-03 DIAGNOSIS — C787 Secondary malignant neoplasm of liver and intrahepatic bile duct: Secondary | ICD-10-CM | POA: Diagnosis not present

## 2017-03-03 DIAGNOSIS — R1907 Generalized intra-abdominal and pelvic swelling, mass and lump: Secondary | ICD-10-CM | POA: Insufficient documentation

## 2017-03-03 DIAGNOSIS — R918 Other nonspecific abnormal finding of lung field: Secondary | ICD-10-CM | POA: Diagnosis not present

## 2017-03-03 DIAGNOSIS — N200 Calculus of kidney: Secondary | ICD-10-CM | POA: Diagnosis not present

## 2017-03-03 MED ORDER — IOPAMIDOL (ISOVUE-300) INJECTION 61%
100.0000 mL | Freq: Once | INTRAVENOUS | Status: AC
  Administered 2017-03-03: 100 mL via INTRAVENOUS

## 2017-03-03 MED ORDER — IOPAMIDOL (ISOVUE-300) INJECTION 61%
INTRAVENOUS | Status: AC
  Filled 2017-03-03: qty 100

## 2017-03-03 NOTE — Telephone Encounter (Signed)
Called and left message asking if liver biopsy could be done earlier at [facility]. It is now scheduled for 1/21. Dr. Alvy Bimler mentioned this when she was in the office.

## 2017-03-03 NOTE — Telephone Encounter (Signed)
Called patient and informed that Dr. Lurlean Leyden ordered a ultrasound biopsy of the liver, expected date 03-07-17. Instructed to stop Xarelto 2 days prior to biopsy. May resume Xarelto the day after procedure. Instructed to call for questions.

## 2017-03-04 ENCOUNTER — Telehealth: Payer: Self-pay | Admitting: Hematology and Oncology

## 2017-03-04 NOTE — Telephone Encounter (Signed)
Scheduled appt per 1/09 los - Patient is aware of appt date and time.  

## 2017-03-07 MED FILL — ALPRAZolam 0.25 MG TABS: 0.25 | 10 days supply | Qty: 30 | Fill #2

## 2017-03-08 ENCOUNTER — Other Ambulatory Visit: Payer: Self-pay | Admitting: Radiology

## 2017-03-09 ENCOUNTER — Ambulatory Visit
Admission: RE | Admit: 2017-03-09 | Discharge: 2017-03-09 | Disposition: A | Discharge status: Home / Self Care | Payer: 59 | Source: Ambulatory Visit | Attending: Hematology and Oncology | Admitting: Hematology and Oncology

## 2017-03-09 DIAGNOSIS — C787 Secondary malignant neoplasm of liver and intrahepatic bile duct: Secondary | ICD-10-CM | POA: Diagnosis not present

## 2017-03-09 DIAGNOSIS — K7689 Other specified diseases of liver: Secondary | ICD-10-CM | POA: Diagnosis not present

## 2017-03-09 DIAGNOSIS — C801 Malignant (primary) neoplasm, unspecified: Secondary | ICD-10-CM | POA: Insufficient documentation

## 2017-03-09 LAB — APTT: APTT: 32 s (ref 24–36)

## 2017-03-09 LAB — PROTIME-INR
INR: 1.08
Prothrombin Time: 13.9 seconds (ref 11.4–15.2)

## 2017-03-09 LAB — CBC
HCT: 33.9 % — ABNORMAL LOW (ref 35.0–47.0)
Hemoglobin: 11.2 g/dL — ABNORMAL LOW (ref 12.0–16.0)
MCH: 27.6 pg (ref 26.0–34.0)
MCHC: 33 g/dL (ref 32.0–36.0)
MCV: 83.6 fL (ref 80.0–100.0)
PLATELETS: 362 10*3/uL (ref 150–440)
RBC: 4.06 MIL/uL (ref 3.80–5.20)
RDW: 14.6 % — AB (ref 11.5–14.5)
WBC: 12 10*3/uL — AB (ref 3.6–11.0)

## 2017-03-09 MED ORDER — ONDANSETRON HCL 4 MG/2ML IJ SOLN
4.0000 mg | Freq: Once | INTRAMUSCULAR | Status: AC
  Administered 2017-03-09: 4 mg via INTRAVENOUS
  Filled 2017-03-09: qty 2

## 2017-03-09 MED ORDER — MORPHINE SULFATE ER 30 MG PO TBCR
30.0000 mg | EXTENDED_RELEASE_TABLET | Freq: Once | ORAL | Status: AC
Start: 2017-03-09 — End: 2017-03-09
  Administered 2017-03-09: 30 mg via ORAL
  Filled 2017-03-09: qty 1

## 2017-03-09 MED ORDER — HYDROCODONE-ACETAMINOPHEN 5-325 MG PO TABS: 1.0000 | ORAL_TABLET | ORAL | Status: DC | PRN

## 2017-03-09 MED ORDER — ONDANSETRON HCL 4 MG/2ML IJ SOLN
INTRAMUSCULAR | Status: AC
  Filled 2017-03-09: qty 2

## 2017-03-09 MED ORDER — FENTANYL CITRATE (PF) 100 MCG/2ML IJ SOLN
INTRAMUSCULAR | Status: AC
  Filled 2017-03-09: qty 4

## 2017-03-09 MED ORDER — MIDAZOLAM HCL 5 MG/5ML IJ SOLN
INTRAMUSCULAR | Status: AC
  Filled 2017-03-09: qty 5

## 2017-03-09 MED ORDER — SODIUM CHLORIDE 0.9 % IV SOLN
INTRAVENOUS | Status: DC
  Administered 2017-03-09: 1000 mL via INTRAVENOUS

## 2017-03-09 MED ORDER — FENTANYL CITRATE (PF) 100 MCG/2ML IJ SOLN
INTRAMUSCULAR | Status: AC | PRN
  Administered 2017-03-09: 50 ug via INTRAVENOUS
  Administered 2017-03-09: 25 ug via INTRAVENOUS

## 2017-03-09 MED ORDER — MIDAZOLAM HCL 5 MG/5ML IJ SOLN
INTRAMUSCULAR | Status: AC | PRN
  Administered 2017-03-09 (×4): 0.5 mg via INTRAVENOUS

## 2017-03-09 NOTE — Procedures (Signed)
  Procedure: [place] core liver lesion R  18g x3 EBL:   minimal Complications:  none immediate  See full dictation in [facility].  Dillard Cannon MD Main # [phone] Pager  [phone]

## 2017-03-11 ENCOUNTER — Inpatient Hospital Stay: Payer: 59 | Admitting: Hematology and Oncology

## 2017-03-11 ENCOUNTER — Encounter: Payer: Self-pay | Admitting: Hematology and Oncology

## 2017-03-11 ENCOUNTER — Inpatient Hospital Stay: Payer: 59

## 2017-03-11 ENCOUNTER — Ambulatory Visit: Payer: 59 | Admitting: Hematology and Oncology

## 2017-03-11 ENCOUNTER — Other Ambulatory Visit: Payer: Self-pay | Admitting: *Deleted

## 2017-03-11 VITALS — BP 113/56 | HR 84 | Temp 98.3°F | Resp 18 | Ht 67.0 in | Wt 280.3 lb

## 2017-03-11 DIAGNOSIS — R1011 Right upper quadrant pain: Secondary | ICD-10-CM

## 2017-03-11 DIAGNOSIS — R1907 Generalized intra-abdominal and pelvic swelling, mass and lump: Secondary | ICD-10-CM | POA: Diagnosis not present

## 2017-03-11 DIAGNOSIS — C801 Malignant (primary) neoplasm, unspecified: Secondary | ICD-10-CM | POA: Insufficient documentation

## 2017-03-11 DIAGNOSIS — C539 Malignant neoplasm of cervix uteri, unspecified: Secondary | ICD-10-CM | POA: Diagnosis not present

## 2017-03-11 DIAGNOSIS — C787 Secondary malignant neoplasm of liver and intrahepatic bile duct: Secondary | ICD-10-CM | POA: Diagnosis not present

## 2017-03-11 DIAGNOSIS — R300 Dysuria: Secondary | ICD-10-CM

## 2017-03-11 DIAGNOSIS — R1900 Intra-abdominal and pelvic swelling, mass and lump, unspecified site: Secondary | ICD-10-CM | POA: Diagnosis not present

## 2017-03-11 DIAGNOSIS — G893 Neoplasm related pain (acute) (chronic): Secondary | ICD-10-CM

## 2017-03-11 DIAGNOSIS — I4891 Unspecified atrial fibrillation: Secondary | ICD-10-CM | POA: Diagnosis not present

## 2017-03-11 DIAGNOSIS — Z7901 Long term (current) use of anticoagulants: Secondary | ICD-10-CM | POA: Diagnosis not present

## 2017-03-11 DIAGNOSIS — B029 Zoster without complications: Secondary | ICD-10-CM | POA: Insufficient documentation

## 2017-03-11 MED ORDER — VALACYCLOVIR HCL 1 G PO TABS: 1000.0000 mg | ORAL_TABLET | Freq: Three times a day (TID) | ORAL | 0 refills | Status: DC

## 2017-03-11 MED ORDER — MORPHINE SULFATE ER 30 MG PO TBCR: 30.0000 mg | EXTENDED_RELEASE_TABLET | Freq: Three times a day (TID) | ORAL | 0 refills | Status: DC

## 2017-03-11 MED ORDER — HYDROMORPHONE HCL 4 MG PO TABS: 4.0000 mg | ORAL_TABLET | ORAL | 0 refills | Status: DC | PRN

## 2017-03-11 MED ORDER — CIPROFLOXACIN HCL 250 MG PO TABS: 250.0000 mg | ORAL_TABLET | Freq: Two times a day (BID) | ORAL | 0 refills | Status: DC

## 2017-03-11 MED FILL — HYDROmorphone HCL 4 MG TABS: 4 | 15 days supply | Qty: 90 | Fill #0

## 2017-03-11 MED FILL — CIPROFLOXACIN HCL 250 MG TA: 250 | 7 days supply | Qty: 14 | Fill #0

## 2017-03-11 MED FILL — valACYclovir HCL 1 GM TABS: 1 | 7 days supply | Qty: 21 | Fill #0

## 2017-03-11 NOTE — Assessment & Plan Note (Signed)
Preliminary biopsy confirmed squamous cell carcinoma, likely originating from probable cervical cancer, given prior history of abnormal Pap smear in the cone biopsy I have spoken with the pathologist to add additional tissue stains to confirmed GYN primary We discussed briefly about the plan of care for port placement in future infusional chemotherapy I have not finalized the chemotherapy plan until final pathology result is available. In the meantime, we will continue supportive care

## 2017-03-11 NOTE — Assessment & Plan Note (Signed)
She has right upper quadrant pain likely due to liver metastasis Her most recent sweats and fever could be related to tumor fever We will monitor closely

## 2017-03-11 NOTE — Progress Notes (Signed)
[place] OFFICE PROGRESS NOTE  Patient Care Team: Cleda Mccreedy as PCP - General (Nurse Practitioner) Arvella Nigh, MD as Obstetrician (Obstetrics and Gynecology) Evans Lance, MD as Consulting Physician (Cardiology)  SUMMARY OF ONCOLOGIC HISTORY:   Cervical cancer ([place])   03/03/2017 Imaging    1. Multifocal liver metastasis. 2. Left posterior pelvic mass extending into the obturator foramen measures up to 7 cm. Suspicious for malignancy. This should be easily amendable to percutaneous tissue sampling under image guidance. 3. No lytic bone lesions identified of myeloma.      03/09/2017 Procedure    Technically successful ultrasound-guided core liver lesion biopsy       INTERVAL HISTORY: Please see below for problem oriented charting. She returns to discuss recent test results In the meantime, she developed herpes zoster affecting the tip of her nose. Last night, she have significant night sweats and chills with temperature of 104.0 She has sensation with difficulties of urination She complained of right upper quadrant pain In general, her left pelvic pain has improved with current prescription MS Contin and Dilaudid as needed Family members noted that she is mildly sedated She denies constipation or nausea  REVIEW OF SYSTEMS:   Eyes: Denies blurriness of vision Ears, nose, mouth, throat, and face: Denies mucositis or sore throat Respiratory: Denies cough, dyspnea or wheezes Cardiovascular: Denies palpitation, chest discomfort or lower extremity swelling Gastrointestinal:  Denies nausea, heartburn or change in bowel habits Lymphatics: Denies new lymphadenopathy or easy bruising Neurological:Denies numbness, tingling or new weaknesses Behavioral/Psych: Mood is stable, no new changes  All other systems were reviewed with the patient and are negative.  I have reviewed the past medical history, past surgical history, social history and family history  with the patient and they are unchanged from previous note.  ALLERGIES:  has No Known Allergies.  MEDICATIONS:  Current Outpatient Medications  Medication Sig Dispense Refill  . ALPRAZolam (XANAX) 0.25 MG tablet Take 0.25 mg by mouth at bedtime as needed for sleep.     Marland Kitchen atorvastatin (LIPITOR) 40 MG tablet Take 1 tablet (40 mg total) by mouth daily at 6 PM. 30 tablet 0  . Biotin 1000 MCG tablet Take 1,000 mcg by mouth daily.    . cholecalciferol (VITAMIN D) 1000 units tablet Take 2,000 Units by mouth daily.    . ciprofloxacin (CIPRO) 250 MG tablet Take 1 tablet (250 mg total) by mouth 2 (two) times daily. 14 tablet 0  . citalopram (CELEXA) 20 MG tablet Take 20 mg by mouth daily.    . flecainide (TAMBOCOR) 50 MG tablet Take 1 tablet (50 mg total) by mouth daily. 90 tablet 3  . furosemide (LASIX) 40 MG tablet Take 40 mg by mouth daily.    Marland Kitchen gabapentin (NEURONTIN) 300 MG capsule Take 300 mg by mouth 3 (three) times daily.  1  . Glucosamine-Chondroitin (GLUCOSAMINE CHONDR COMPLEX PO) Take 2 capsules by mouth daily.     Marland Kitchen HYDROmorphone (DILAUDID) 4 MG tablet Take 1 tablet (4 mg total) by mouth every 4 (four) hours as needed. 90 tablet 0  . metoprolol tartrate (LOPRESSOR) 50 MG tablet Take 1 tablet (50 mg total) by mouth daily. 90 tablet 3  . morphine (MS CONTIN) 30 MG 12 hr tablet Take 1 tablet (30 mg total) by mouth every 8 (eight) hours. 90 tablet 0  . Multiple Vitamin (MULTIVITAMIN) tablet Take 2 tablets by mouth daily.     . Omega-3 Fatty Acids (FISH OIL) 1000 MG CAPS  Take 2 capsules by mouth daily.      Marland Kitchen oxyCODONE (ROXICODONE) 5 MG immediate release tablet Take 1 tablet (5 mg total) by mouth every 6 (six) hours as needed for severe pain. 10 tablet 0  . rivaroxaban (XARELTO) 20 MG TABS tablet Take 1 tablet (20 mg total) by mouth daily. 30 tablet 0  . Turmeric (CURCUMIN 95) 500 MG CAPS Take 3 capsules by mouth daily.     . valACYclovir (VALTREX) 1000 MG tablet Take 1 tablet (1,000 mg total)  by mouth 3 (three) times daily. 21 tablet 0   No current facility-administered medications for this visit.     PHYSICAL EXAMINATION: ECOG PERFORMANCE STATUS: 2 - Symptomatic, <50% confined to bed  Vitals:   03/11/17 1444  BP: (!) 113/56  Pulse: 84  Resp: 18  Temp: 98.3 F (36.8 C)  SpO2: 100%   Filed Weights   03/11/17 1444  Weight: 280 lb 4.8 oz (127.1 kg)    GENERAL:alert, no distress and comfortable SKIN: Noted skin breakout at the tip of her nose consistent with herpes zoster EYES: normal, Conjunctiva are pink and non-injected, sclera clear OROPHARYNX:no exudate, no erythema and lips, buccal mucosa, and tongue normal  NECK: supple, thyroid normal size, non-tender, without nodularity LYMPH:  no palpable lymphadenopathy in the cervical, axillary or inguinal LUNGS: clear to auscultation and percussion with normal breathing effort HEART: regular rate & rhythm and no murmurs and no lower extremity edema ABDOMEN:abdomen soft, non-tender and normal bowel sounds Musculoskeletal:no cyanosis of digits and no clubbing  NEURO: alert & oriented x 3 with fluent speech, no focal motor/sensory deficits  LABORATORY DATA:  I have reviewed the data as listed    Component Value Date/Time   NA 132 (L) 02/28/2017 0359   K 4.2 02/28/2017 0359   CL 98 (L) 02/28/2017 0359   CO2 23 02/28/2017 0359   GLUCOSE 266 (H) 02/28/2017 0359   BUN 16 02/28/2017 0359   CREATININE 0.76 02/28/2017 0359   CALCIUM 8.9 02/28/2017 0359   PROT 7.0 02/28/2017 0359   ALBUMIN 3.2 (L) 02/28/2017 0359   AST 24 02/28/2017 0359   ALT 24 02/28/2017 0359   ALKPHOS 87 02/28/2017 0359   BILITOT 0.5 02/28/2017 0359   GFRNONAA >60 02/28/2017 0359   GFRAA >60 02/28/2017 0359    No results found for: SPEP, UPEP  Lab Results  Component Value Date   WBC 12.0 (H) 03/09/2017   NEUTROABS 8.2 (H) 02/28/2017   HGB 11.2 (L) 03/09/2017   HCT 33.9 (L) 03/09/2017   MCV 83.6 03/09/2017   PLT 362 03/09/2017       Chemistry      Component Value Date/Time   NA 132 (L) 02/28/2017 0359   K 4.2 02/28/2017 0359   CL 98 (L) 02/28/2017 0359   CO2 23 02/28/2017 0359   BUN 16 02/28/2017 0359   CREATININE 0.76 02/28/2017 0359      Component Value Date/Time   CALCIUM 8.9 02/28/2017 0359   ALKPHOS 87 02/28/2017 0359   AST 24 02/28/2017 0359   ALT 24 02/28/2017 0359   BILITOT 0.5 02/28/2017 0359       RADIOGRAPHIC STUDIES: I have reviewed the imaging study with the patient and family I have personally reviewed the radiological images as listed and agreed with the findings in the report. Dg Chest 2 View  Result Date: 02/28/2017 CLINICAL DATA:  52 y/o  F; shortness of breath. EXAM: CHEST  2 VIEW COMPARISON:  02/18/2012 chest radiograph  FINDINGS: Stable heart size and mediastinal contours are within normal limits. Minor atelectasis in lung bases. No consolidation. The visualized skeletal structures are unremarkable. IMPRESSION: Minor atelectasis in lung bases.  No consolidation. Electronically Signed   By: Kristine Garbe M.D.   On: 02/28/2017 04:39   Ct Chest W Contrast  Result Date: 03/03/2017 CLINICAL DATA:  Generalized intra-abdominal and pelvic swelling. Mass and lump. EXAM: CT CHEST, ABDOMEN, AND PELVIS WITH CONTRAST TECHNIQUE: Multidetector CT imaging of the chest, abdomen and pelvis was performed following the standard protocol during bolus administration of intravenous contrast. CONTRAST:  <See Chart> ISOVUE-300 IOPAMIDOL (ISOVUE-300) INJECTION 61% COMPARISON:  None. FINDINGS: CT CHEST FINDINGS Cardiovascular: Normal heart size.  No pericardial effusion. Mediastinum/Nodes: The trachea is patent and midline. Normal appearance of the esophagus. No enlarged mediastinal or hilar lymph nodes. No axillary or supraclavicular adenopathy. Lungs/Pleura: No pleural effusions. Platelike atelectasis versus scar identified within the right middle lobe, right lower lobe and lingula. No suspicious pulmonary  nodules or masses. Musculoskeletal: Degenerative disc disease identified within the thoracic spine. CT ABDOMEN PELVIS FINDINGS Hepatobiliary: Diffuse hepatic steatosis. Peripherally enhancing lesion within the lateral segment of left lobe of liver measures 2.3 cm, image 56 of series 2. Anterior dome of liver lesion measures 1.8 cm, image 42 of series 2. There are at least scratch set multiple lesions within the right lobe of liver noted. The largest is in segment 7 measuring 10.3 by 5.2 x 5.9 cm, image 59 of series 2. The gallbladder appears normal. Pancreas: Unremarkable. No pancreatic ductal dilatation or surrounding inflammatory changes. Spleen: Normal in size without focal abnormality. Adrenals/Urinary Tract: The adrenal glands are normal. Small nonobstructing right renal calculi noted within the interpolar kidney. No mass or hydronephrosis. Urinary bladder appears normal. Stomach/Bowel: Stomach is within normal limits. Appendix appears normal. No evidence of bowel wall thickening, distention, or inflammatory changes. Vascular/Lymphatic: No significant vascular findings are present. No enlarged abdominal or pelvic lymph nodes. Reproductive: Uterus and bilateral adnexa are unremarkable. Other: Left posterior pelvic mass extending into the obturator foramen measures 7 by 4.9 cm, image 115 of series 2. No surrounding bone changes identified. Musculoskeletal: No aggressive lytic or sclerotic bone lesions. IMPRESSION: 1. Multifocal liver metastasis. 2. Left posterior pelvic mass extending into the obturator foramen measures up to 7 cm. Suspicious for malignancy. This should be easily amendable to percutaneous tissue sampling under image guidance. 3. No lytic bone lesions identified of myeloma. Electronically Signed   By: Kerby Moors M.D.   On: 03/03/2017 11:21   Ct Abdomen Pelvis W Contrast  Result Date: 03/03/2017 CLINICAL DATA:  Generalized intra-abdominal and pelvic swelling. Mass and lump. EXAM: CT CHEST,  ABDOMEN, AND PELVIS WITH CONTRAST TECHNIQUE: Multidetector CT imaging of the chest, abdomen and pelvis was performed following the standard protocol during bolus administration of intravenous contrast. CONTRAST:  <See Chart> ISOVUE-300 IOPAMIDOL (ISOVUE-300) INJECTION 61% COMPARISON:  None. FINDINGS: CT CHEST FINDINGS Cardiovascular: Normal heart size.  No pericardial effusion. Mediastinum/Nodes: The trachea is patent and midline. Normal appearance of the esophagus. No enlarged mediastinal or hilar lymph nodes. No axillary or supraclavicular adenopathy. Lungs/Pleura: No pleural effusions. Platelike atelectasis versus scar identified within the right middle lobe, right lower lobe and lingula. No suspicious pulmonary nodules or masses. Musculoskeletal: Degenerative disc disease identified within the thoracic spine. CT ABDOMEN PELVIS FINDINGS Hepatobiliary: Diffuse hepatic steatosis. Peripherally enhancing lesion within the lateral segment of left lobe of liver measures 2.3 cm, image 56 of series 2. Anterior dome of liver lesion measures  1.8 cm, image 42 of series 2. There are at least scratch set multiple lesions within the right lobe of liver noted. The largest is in segment 7 measuring 10.3 by 5.2 x 5.9 cm, image 59 of series 2. The gallbladder appears normal. Pancreas: Unremarkable. No pancreatic ductal dilatation or surrounding inflammatory changes. Spleen: Normal in size without focal abnormality. Adrenals/Urinary Tract: The adrenal glands are normal. Small nonobstructing right renal calculi noted within the interpolar kidney. No mass or hydronephrosis. Urinary bladder appears normal. Stomach/Bowel: Stomach is within normal limits. Appendix appears normal. No evidence of bowel wall thickening, distention, or inflammatory changes. Vascular/Lymphatic: No significant vascular findings are present. No enlarged abdominal or pelvic lymph nodes. Reproductive: Uterus and bilateral adnexa are unremarkable. Other: Left  posterior pelvic mass extending into the obturator foramen measures 7 by 4.9 cm, image 115 of series 2. No surrounding bone changes identified. Musculoskeletal: No aggressive lytic or sclerotic bone lesions. IMPRESSION: 1. Multifocal liver metastasis. 2. Left posterior pelvic mass extending into the obturator foramen measures up to 7 cm. Suspicious for malignancy. This should be easily amendable to percutaneous tissue sampling under image guidance. 3. No lytic bone lesions identified of myeloma. Electronically Signed   By: Kerby Moors M.D.   On: 03/03/2017 11:21   US Biopsy (liver)  Result Date: 03/09/2017 CLINICAL DATA:  Lytic presacral lesion. Multiple liver lesions suggesting metastatic disease. EXAM: ULTRASOUND GUIDED CORE BIOPSY OF LIVER LESION MEDICATIONS: Intravenous Fentanyl and Versed were administered as conscious sedation during continuous monitoring of the patient's level of consciousness and physiological / cardiorespiratory status by the radiology RN, with a total moderate sedation time of 13 minutes. PROCEDURE: The procedure, risks, benefits, and alternatives were explained to the patient. Questions regarding the procedure were encouraged and answered. The patient understands and consents to the procedure. Survey ultrasound of the liver was performed. A representative lesion corresponding to CT findings was localized. Skin entry site was determined and marked. The operative field was prepped with chlorhexidine in a sterile fashion, and a sterile drape was applied covering the operative field. A sterile gown and sterile gloves were used for the procedure. Local anesthesia was provided with 1% Lidocaine. Under real-time ultrasound guidance, a 17 gauge trocar needle was advanced to the margin of the lesion. Once needle tip position was confirmed, coaxial 18-gauge core biopsy samples were obtained, submitted in formalin to surgical pathology. The guide needle was removed. Postprocedure scans show  no hemorrhage or other apparent complication. The patient tolerated the procedure well. COMPLICATIONS: None. FINDINGS: Hypoechoic liver lesions corresponding to CT findings were localized. Representative core biopsy samples obtained as above. IMPRESSION: 1. Technically successful ultrasound-guided core liver lesion biopsy Electronically Signed   By: Lucrezia Europe M.D.   On: 03/09/2017 17:01   Mr [employer] Spine  Result Date: 03/03/2017 This examination belongs to an outside facility and is stored here for comparison purposes only.  Contact the originating outside institution for any associated report or interpretation.   ASSESSMENT & PLAN:  Cervical cancer ([place]) Preliminary biopsy confirmed squamous cell carcinoma, likely originating from probable cervical cancer, given prior history of abnormal Pap smear in the cone biopsy I have spoken with the pathologist to add additional tissue stains to confirmed GYN primary We discussed briefly about the plan of care for port placement in future infusional chemotherapy I have not finalized the chemotherapy plan until final pathology result is available. In the meantime, we will continue supportive care  Metastasis to liver [facility]) She has right  upper quadrant pain likely due to liver metastasis Her most recent sweats and fever could be related to tumor fever We will monitor closely  Dysuria She has mild fever, dysuria and chills I will order urinalysis and urine culture We will start her on ciprofloxacin for empiric antibiotic treatment  Cancer associated pain Her pain is well controlled but family members noted mild sedation I recommend reducing the frequency of MS Contin to twice a day and reduce the dose of Dilaudid  I would reassess pain control in her next visit  Herpes zoster She has new onset of shingles affecting the tips of her nose I will start her on Valtrex and reassess in the next visit   Orders Placed This Encounter  Procedures   . Urine Culture    Standing Status:   Future    Number of Occurrences:   1    Standing Expiration Date:   04/15/2018  . IR FLUORO GUIDE PORT INSERTION RIGHT    Standing Status:   Future    Standing Expiration Date:   05/10/2018    Order Specific Question:   Reason for Exam (SYMPTOM  OR DIAGNOSIS REQUIRED)    Answer:   need port for chemo    Order Specific Question:   Preferred Imaging Location?    Answer:   [facility]  . Urinalysis, Microscopic - [facility]    Standing Status:   Future    Number of Occurrences:   1    Standing Expiration Date:   04/15/2018   All questions were answered. The patient knows to call the clinic with any problems, questions or concerns. No barriers to learning was detected. I spent 25 minutes counseling the patient face to face. The total time spent in the appointment was 40 minutes and more than 50% was on counseling and review of test results     [name], MD 03/11/2017 7:15 PM

## 2017-03-11 NOTE — Assessment & Plan Note (Signed)
Her pain is well controlled but family members noted mild sedation I recommend reducing the frequency of MS Contin to twice a day and reduce the dose of Dilaudid  I would reassess pain control in her next visit

## 2017-03-11 NOTE — Assessment & Plan Note (Signed)
She has new onset of shingles affecting the tips of her nose I will start her on Valtrex and reassess in the next visit

## 2017-03-11 NOTE — Assessment & Plan Note (Signed)
She has mild fever, dysuria and chills I will order urinalysis and urine culture We will start her on ciprofloxacin for empiric antibiotic treatment

## 2017-03-12 LAB — URINE CULTURE: Culture: NO GROWTH

## 2017-03-14 ENCOUNTER — Telehealth: Payer: Self-pay | Admitting: Hematology and Oncology

## 2017-03-14 ENCOUNTER — Ambulatory Visit (HOSPITAL_COMMUNITY): Payer: 59

## 2017-03-14 ENCOUNTER — Telehealth: Payer: Self-pay | Admitting: *Deleted

## 2017-03-14 ENCOUNTER — Ambulatory Visit: Payer: 59 | Admitting: Hematology and Oncology

## 2017-03-14 ENCOUNTER — Other Ambulatory Visit: Payer: Self-pay | Admitting: Pathology

## 2017-03-14 LAB — SURGICAL PATHOLOGY

## 2017-03-14 NOTE — Telephone Encounter (Signed)
Returned call after voicemail was left - spoke with patient and confirmed appts.

## 2017-03-14 NOTE — Telephone Encounter (Signed)
-----   Message from [name], MD sent at 03/14/2017  8:12 AM EST ----- Regarding: urine culture no growth She can stop antibiotics Urine culture showed no growth ----- Message ----- From: Interface, Lab In [facility] Sent: 03/12/2017   2:55 PM To: [name], MD

## 2017-03-14 NOTE — Telephone Encounter (Signed)
Notified of message below

## 2017-03-15 ENCOUNTER — Telehealth: Payer: Self-pay

## 2017-03-15 ENCOUNTER — Other Ambulatory Visit: Payer: Self-pay | Admitting: Hematology and Oncology

## 2017-03-15 ENCOUNTER — Other Ambulatory Visit: Payer: 59

## 2017-03-15 NOTE — Telephone Encounter (Signed)
Called back per Dr. Alvy Bimler. Reminded to hold Xarelto for 24 hours with port placement. Verbalized understanding.   She sounded sleepy on the phone. Denies being sedated or sleepy with medications. Instructed to contact doctor who prescribed Xanax if she needs refill. Dr. Alvy Bimler will talk with her regarding Xanax Rx during her appt on 1/28.

## 2017-03-15 NOTE — Telephone Encounter (Signed)
Called and left message requesting Xanax Rx called into pharmacy. States she usually gets it from another doctor. Requesting 0.5 mg Xanax Rx.

## 2017-03-16 ENCOUNTER — Telehealth: Payer: Self-pay | Admitting: Hematology and Oncology

## 2017-03-16 ENCOUNTER — Telehealth: Payer: Self-pay | Admitting: Gynecologic Oncology

## 2017-03-16 NOTE — Telephone Encounter (Signed)
03/16/17 called and spoke with patient @ 4:05 pm and confirmed that FMLA paperwork was successfully faxed to [employer] @ [phone] on 03/10/17 @ 11:48 am.  Patient requested copy for personal records.

## 2017-03-16 NOTE — Telephone Encounter (Signed)
Spoke to patients daughter regarding upcoming January appointments per 1/23 sch message.

## 2017-03-16 NOTE — Telephone Encounter (Signed)
Called to inform patient of appt with Dr. Denman George this Friday at 9:30am.  Discussed what could be expected including a pelvic examination with possible biopsies if warranted.  Patient verbalizing understanding.  She states currently she is in moderate pain and is laying at home on a heating pad.  She is taking MS Contin and dilaudid during the day which offers some relief.  All questions answered.  Advised to call for any needs prior to her appt.

## 2017-03-18 ENCOUNTER — Other Ambulatory Visit: Payer: Self-pay | Admitting: Hematology and Oncology

## 2017-03-18 ENCOUNTER — Inpatient Hospital Stay (HOSPITAL_BASED_OUTPATIENT_CLINIC_OR_DEPARTMENT_OTHER): Payer: 59 | Admitting: Medical

## 2017-03-18 ENCOUNTER — Inpatient Hospital Stay: Payer: 59 | Admitting: Gynecologic Oncology

## 2017-03-18 ENCOUNTER — Encounter: Payer: Self-pay | Admitting: Gynecologic Oncology

## 2017-03-18 VITALS — BP 142/92 | HR 93 | Temp 100.2°F | Resp 20 | Ht 67.0 in | Wt 270.0 lb

## 2017-03-18 VITALS — BP 142/92 | HR 93 | Temp 100.2°F | Resp 20 | Wt 270.4 lb

## 2017-03-18 DIAGNOSIS — Z8742 Personal history of other diseases of the female genital tract: Secondary | ICD-10-CM | POA: Diagnosis not present

## 2017-03-18 DIAGNOSIS — G893 Neoplasm related pain (acute) (chronic): Secondary | ICD-10-CM | POA: Diagnosis not present

## 2017-03-18 DIAGNOSIS — Z7901 Long term (current) use of anticoagulants: Secondary | ICD-10-CM | POA: Diagnosis not present

## 2017-03-18 DIAGNOSIS — R300 Dysuria: Secondary | ICD-10-CM | POA: Diagnosis not present

## 2017-03-18 DIAGNOSIS — Z87898 Personal history of other specified conditions: Secondary | ICD-10-CM | POA: Diagnosis not present

## 2017-03-18 DIAGNOSIS — C787 Secondary malignant neoplasm of liver and intrahepatic bile duct: Secondary | ICD-10-CM | POA: Insufficient documentation

## 2017-03-18 DIAGNOSIS — C799 Secondary malignant neoplasm of unspecified site: Secondary | ICD-10-CM

## 2017-03-18 DIAGNOSIS — C801 Malignant (primary) neoplasm, unspecified: Secondary | ICD-10-CM

## 2017-03-18 DIAGNOSIS — R1900 Intra-abdominal and pelvic swelling, mass and lump, unspecified site: Secondary | ICD-10-CM | POA: Diagnosis not present

## 2017-03-18 DIAGNOSIS — C539 Malignant neoplasm of cervix uteri, unspecified: Secondary | ICD-10-CM

## 2017-03-18 DIAGNOSIS — R1011 Right upper quadrant pain: Secondary | ICD-10-CM | POA: Diagnosis not present

## 2017-03-18 DIAGNOSIS — N950 Postmenopausal bleeding: Secondary | ICD-10-CM | POA: Diagnosis not present

## 2017-03-18 DIAGNOSIS — I4891 Unspecified atrial fibrillation: Secondary | ICD-10-CM | POA: Diagnosis not present

## 2017-03-18 DIAGNOSIS — R1907 Generalized intra-abdominal and pelvic swelling, mass and lump: Secondary | ICD-10-CM | POA: Diagnosis not present

## 2017-03-18 DIAGNOSIS — IMO0002 Reserved for concepts with insufficient information to code with codable children: Secondary | ICD-10-CM

## 2017-03-18 MED ORDER — MORPHINE SULFATE ER 60 MG PO TBCR: 60.0000 mg | EXTENDED_RELEASE_TABLET | Freq: Three times a day (TID) | ORAL | 0 refills | Status: DC

## 2017-03-18 MED ORDER — HYDROMORPHONE HCL 4 MG PO TABS: 4.0000 mg | ORAL_TABLET | ORAL | 0 refills | Status: DC | PRN

## 2017-03-18 MED FILL — MORPHINE SULF 60 MG TAB SA: 60 | 30 days supply | Qty: 90 | Fill #0

## 2017-03-18 NOTE — Progress Notes (Signed)
Consult Note: Gyn-Onc  Consult was requested by Dr. Alvy Bimler for the evaluation of Cindy Faulkner 52 y.o. female  CC:  Chief Complaint  Patient presents with  . Metastatic squamous cell carcinoma (HCC)    Assessment/Plan:  Cindy Faulkner  is a 52 y.o.  year old with metastatic SCC of unknown origin.  Her cervix, vagina, anus and vulva are grossly normal. We will follow-up her biopsies to ensure there is no occult process, though I do not suspect this and informed the patient as such. If confirmed negative, this cancer should be described as of unknown origin and not ascribed to a gynecologic primary.  She has intractable pain originating from the left pelvic/obturator mass from likely nerve involvement. Refilled short acting pain meds but clearly needs optimization of regimen. Will send to Symptom management clinic today to address. Recommend considering palliative RT to pelvis to control symptoms.   No further follow-up with me necessary unless biopsies show occult gyn malignancy.  HPI: Cindy Faulkner is a 52 year old P1 who is seen in consultation at the request of Dr. Alvy Bimler for squamous cell carcinoma of unknown primary.  The patient has a 12-week history of back and pelvic pain.  She initially saw her chiropractor for this and then a neurosurgeon.  Imaging identified left obturator mass infiltrating the bone and this prompted a CT scan of the abdomen and pelvis which was performed on March 03, 2017 this revealed multifocal liver metastases with the largest measuring 10.3 cm.  There was also a left posterior pelvic mass extending into the obturator foramen measuring 7 x 4.9 cm.  The pelvic organs are grossly normal otherwise.  There was no clear source of primary.  The patient reports a remote history of an abnormal Pap smear approximately 20 years ago for which she was treated with a cone biopsy.  She had annual Pap smears following this which it will be normal.  She has had  approximately 3 episodes of postmenopausal bleeding the most recent of which was approximately 2 years ago.  At each time this was treated with a diagnostic hysteroscopy and sampling at all times confirmed benign polyp.  She is developing intractable pain in the pelvis and back and shooting down into the labia.  She is tearful from this she cannot sleep from this and is taking for Dilaudid every 4 hours to attempt to control the pain and MS Contin 30 mg 3 times a day.  She is also taking gabapentin for the pain.    Current Meds:  Outpatient Encounter Medications as of 03/18/2017  Medication Sig  . ALPRAZolam (XANAX) 0.25 MG tablet Take 0.25 mg by mouth 3 (three) times daily as needed for sleep.   Marland Kitchen atorvastatin (LIPITOR) 40 MG tablet Take 1 tablet (40 mg total) by mouth daily at 6 PM.  . Biotin 1000 MCG tablet Take 1,000 mcg by mouth daily.  . cholecalciferol (VITAMIN D) 1000 units tablet Take 2,000 Units by mouth daily.  . ciprofloxacin (CIPRO) 250 MG tablet Take 1 tablet (250 mg total) by mouth 2 (two) times daily.  . citalopram (CELEXA) 20 MG tablet Take 20 mg by mouth daily.  . flecainide (TAMBOCOR) 50 MG tablet Take 1 tablet (50 mg total) by mouth daily.  . furosemide (LASIX) 40 MG tablet Take 40 mg by mouth daily.  Marland Kitchen gabapentin (NEURONTIN) 300 MG capsule Take 300 mg by mouth 3 (three) times daily.  . Glucosamine-Chondroitin (GLUCOSAMINE CHONDR COMPLEX PO) Take 2  capsules by mouth daily.   Marland Kitchen HYDROmorphone (DILAUDID) 4 MG tablet Take 1 tablet (4 mg total) by mouth every 4 (four) hours as needed.  . metoprolol tartrate (LOPRESSOR) 50 MG tablet Take 1 tablet (50 mg total) by mouth daily.  Marland Kitchen morphine (MS CONTIN) 30 MG 12 hr tablet Take 1 tablet (30 mg total) by mouth every 8 (eight) hours.  . Multiple Vitamin (MULTIVITAMIN) tablet Take 2 tablets by mouth daily.   . Omega-3 Fatty Acids (FISH OIL) 1000 MG CAPS Take 2 capsules by mouth daily.    Marland Kitchen oxyCODONE (ROXICODONE) 5 MG immediate release  tablet Take 1 tablet (5 mg total) by mouth every 6 (six) hours as needed for severe pain.  . rivaroxaban (XARELTO) 20 MG TABS tablet Take 1 tablet (20 mg total) by mouth daily.  . Turmeric (CURCUMIN 95) 500 MG CAPS Take 3 capsules by mouth daily.   . valACYclovir (VALTREX) 1000 MG tablet Take 1 tablet (1,000 mg total) by mouth 3 (three) times daily.  . [DISCONTINUED] HYDROmorphone (DILAUDID) 4 MG tablet Take 1 tablet (4 mg total) by mouth every 4 (four) hours as needed.   No facility-administered encounter medications on file as of 03/18/2017.     Allergy: No Known Allergies  Social Hx:   Social History   Socioeconomic History  . Marital status: Married    Spouse name: Kasandra Knudsen  . Number of children: 1  . Years of education: Not on file  . Highest education level: Not on file  Social Needs  . Financial resource strain: Not on file  . Food insecurity - worry: Not on file  . Food insecurity - inability: Not on file  . Transportation needs - medical: Not on file  . Transportation needs - non-medical: Not on file  Occupational History  . Occupation: nurse  Tobacco Use  . Smoking status: Former Smoker    Packs/day: 0.50    Years: 10.00    Pack years: 5.00    Types: Cigarettes    Last attempt to quit: 02/22/1998    Years since quitting: 19.0  . Smokeless tobacco: Never Used  Substance and Sexual Activity  . Alcohol use: No  . Drug use: No  . Sexual activity: Not on file  Other Topics Concern  . Not on file  Social History Narrative  . Not on file    Past Surgical Hx:  Past Surgical History:  Procedure Laterality Date  . COLONOSCOPY    . HYSTEROSCOPY    . LEEP    . Pylonidal cystect    . TUBAL LIGATION    . WISDOM TOOTH EXTRACTION      Past Medical Hx:  Past Medical History:  Diagnosis Date  . Atrial fibrillation ([place])    a. echo 4/07: EF 60%  . Atrial tachycardia ([place])    a. s/p EPS 4/09: no inducible SVT - med Tx continued  . Complication of anesthesia    age  32yo, woke during procedure with GA, paralysed   . Endometrial polyp   . Hyperlipidemia   . Hypertension   . PONV (postoperative nausea and vomiting)   . Rectocele    WITHOUT MENTION OF UTERINE PROLAPSE  . Stroke ([place])   . SVD (spontaneous vaginal delivery)    x 1    Past Gynecological History:  SVD x 1. [facility] for abnormal pap 20 years ago with normal paps since. hysteroscopies nad D&C for postmenopausal bleeding x 3 (benign). Patient's last menstrual period was 02/12/2012.  Family Hx:  Family History  Problem Relation Age of Onset  . Hypertension Father   . Diabetes Father   . Stroke Father   . Hypertension Mother   . Stroke Mother   . Cancer Mother        cervical ca  . Cancer Sister 71       uterine ca  . Cancer Maternal Grandmother        lung ca    Review of Systems:  Constitutional  + severe  pain  ENT Normal appearing ears and nares bilaterally Skin/Breast  No rash, sores, jaundice, itching, dryness Cardiovascular  No chest pain, shortness of breath, or edema  Pulmonary  No cough or wheeze.  Gastro Intestinal  + severe abdominal and pelvic pain Genito Urinary  No frequency, urgency, dysuria, no bleeding Musculo Skeletal  No myalgia, arthralgia, joint swelling  Neurologic  + severe pain in back and neurologic shooting pain in to vulva. Psychology  +depression, + anxiety, + insomnia.   Vitals:  Blood pressure (!) 142/92, pulse 93, temperature 100.2 F (37.9 C), temperature source Oral, resp. rate 20, weight 270 lb 6.4 oz (122.7 kg), last menstrual period 02/12/2012, SpO2 95 %.  Physical Exam: WD in NAD Neck  Supple NROM, without any enlargements.  Lymph Node Survey No cervical supraclavicular or inguinal adenopathy Cardiovascular  Pulse normal rate, regularity and rhythm. S1 and S2 normal.  Lungs  Clear to auscultation bilateraly, without wheezes/crackles/rhonchi. Good air movement.  Skin  No rash/lesions/breakdown  Psychiatry  Alert and  oriented to person, place, and time  Abdomen  Normoactive bowel sounds, abdomen soft, non-tender and obese without evidence of hernia.  Back No CVA tenderness Genito Urinary  Vulva/vagina: Normal external female genitalia.  No lesions. No discharge or bleeding.  Bladder/urethra:  No lesions or masses, well supported bladder  Vagina: normal, no lesions  Cervix: Normal appearing, no lesions.  Uterus:  Small, mobile, no parametrial involvement or nodularity.  Adnexa: no masses. Rectal  Good tone, no masses no cul de sac nodularity. Anus visibly and palpably normal. Extremities  No bilateral cyanosis, clubbing or edema.  PROCEDURE NOTE: Preop Dx: SCC of unknown primary, hx of abnormal pap, hx of postmenopausal bleeding Postop Dx: same Procedure: endometrial, endocervical and cervical biopsy Surgeon: Everitt Amber Complications: none Specimens: 1/ endometrial bx, 2/ endocervical curette 3/ ectocervical biopsy Procedure: The patient provided verbal consent and timeout was performed the cervix was grasped with a tenaculum and the Allis finder was inserted into the cervix to dilate the cervix.  The endometrial Pipelle was then delivered to the uterine fundus and a scant amount of endometrial tissue was retrieved and sent for pathology.  The Kevorkian curette was then used to sample the endocervical canal and this was also sent for pathology.  A Kevorkian biopsy forcep was used to sample a random piece of ectocervix.  Patient tolerated procedure well.  EBL minimal.   Donaciano Eva, MD  03/18/2017, 10:36 AM

## 2017-03-18 NOTE — Progress Notes (Signed)
Pt arrived to [facility] from [place] [facility] for evaluation of increased lower back and left hip/thigh pain.  Pt had just completed getting cervical and vaginal biopsies and pain escalated during and after that.  Pt more comfortable after having rested a bit, but overall her pain has been getting difficult to manage. She is on MSContin 30 mg q8 hours and dilaudid 4 mg every 4 hours prn.  She is taking the dilaudid every 4 hours ATC.  Pt states that the pain was so bad yesterday that she was taking the MSContin more often than every 8 hrs-taking every 6 or even more often.  Sandi Mealy, PA made aware of pain control issues in the face of large tumor burden

## 2017-03-18 NOTE — Progress Notes (Signed)
START OFF PATHWAY REGIMEN - [Other Dx]   [id number]:Carboplatin + Paclitaxel (5/175) q21 Days:   A cycle is every 21 days:     Paclitaxel      Carboplatin   **Always confirm dose/schedule in your pharmacy ordering system**  Patient Characteristics: Intent of Therapy: Non-Curative / Palliative Intent, Discussed with Patient 

## 2017-03-21 ENCOUNTER — Ambulatory Visit
Admit: 2017-03-21 | Discharge: 2017-03-21 | Disposition: A | Discharge status: Home / Self Care | Payer: 59 | Attending: Radiation Oncology | Admitting: Radiation Oncology

## 2017-03-21 ENCOUNTER — Telehealth: Payer: Self-pay | Admitting: *Deleted

## 2017-03-21 ENCOUNTER — Other Ambulatory Visit: Payer: Self-pay

## 2017-03-21 ENCOUNTER — Inpatient Hospital Stay: Payer: 59

## 2017-03-21 ENCOUNTER — Ambulatory Visit: Payer: 59 | Admitting: Hematology and Oncology

## 2017-03-21 ENCOUNTER — Inpatient Hospital Stay (HOSPITAL_COMMUNITY)
Admission: AD | Admit: 2017-03-21 | Discharge: 2017-04-01 | DRG: 948 | Disposition: A | Discharge status: Home / Self Care | Payer: 59 | Source: Ambulatory Visit | Attending: Internal Medicine | Admitting: Internal Medicine

## 2017-03-21 ENCOUNTER — Encounter (HOSPITAL_COMMUNITY): Payer: Self-pay | Admitting: *Deleted

## 2017-03-21 ENCOUNTER — Inpatient Hospital Stay: Payer: 59 | Admitting: Hematology and Oncology

## 2017-03-21 DIAGNOSIS — Z515 Encounter for palliative care: Secondary | ICD-10-CM | POA: Diagnosis not present

## 2017-03-21 DIAGNOSIS — I471 Supraventricular tachycardia: Secondary | ICD-10-CM | POA: Diagnosis present

## 2017-03-21 DIAGNOSIS — I4891 Unspecified atrial fibrillation: Secondary | ICD-10-CM | POA: Diagnosis present

## 2017-03-21 DIAGNOSIS — C787 Secondary malignant neoplasm of liver and intrahepatic bile duct: Secondary | ICD-10-CM

## 2017-03-21 DIAGNOSIS — F329 Major depressive disorder, single episode, unspecified: Secondary | ICD-10-CM | POA: Diagnosis present

## 2017-03-21 DIAGNOSIS — G4733 Obstructive sleep apnea (adult) (pediatric): Secondary | ICD-10-CM | POA: Diagnosis present

## 2017-03-21 DIAGNOSIS — Z66 Do not resuscitate: Secondary | ICD-10-CM | POA: Diagnosis present

## 2017-03-21 DIAGNOSIS — Z87891 Personal history of nicotine dependence: Secondary | ICD-10-CM

## 2017-03-21 DIAGNOSIS — R269 Unspecified abnormalities of gait and mobility: Secondary | ICD-10-CM | POA: Diagnosis not present

## 2017-03-21 DIAGNOSIS — R1900 Intra-abdominal and pelvic swelling, mass and lump, unspecified site: Secondary | ICD-10-CM | POA: Diagnosis present

## 2017-03-21 DIAGNOSIS — I6330 Cerebral infarction due to thrombosis of unspecified cerebral artery: Secondary | ICD-10-CM

## 2017-03-21 DIAGNOSIS — T380X5A Adverse effect of glucocorticoids and synthetic analogues, initial encounter: Secondary | ICD-10-CM | POA: Diagnosis present

## 2017-03-21 DIAGNOSIS — R410 Disorientation, unspecified: Secondary | ICD-10-CM | POA: Diagnosis not present

## 2017-03-21 DIAGNOSIS — X16XXXA Contact with hot heating appliances, radiators and pipes, initial encounter: Secondary | ICD-10-CM | POA: Diagnosis present

## 2017-03-21 DIAGNOSIS — R918 Other nonspecific abnormal finding of lung field: Secondary | ICD-10-CM | POA: Diagnosis not present

## 2017-03-21 DIAGNOSIS — I10 Essential (primary) hypertension: Secondary | ICD-10-CM | POA: Diagnosis present

## 2017-03-21 DIAGNOSIS — G4700 Insomnia, unspecified: Secondary | ICD-10-CM | POA: Diagnosis not present

## 2017-03-21 DIAGNOSIS — C801 Malignant (primary) neoplasm, unspecified: Principal | ICD-10-CM

## 2017-03-21 DIAGNOSIS — M533 Sacrococcygeal disorders, not elsewhere classified: Secondary | ICD-10-CM

## 2017-03-21 DIAGNOSIS — Z7901 Long term (current) use of anticoagulants: Secondary | ICD-10-CM | POA: Diagnosis not present

## 2017-03-21 DIAGNOSIS — R222 Localized swelling, mass and lump, trunk: Secondary | ICD-10-CM | POA: Diagnosis not present

## 2017-03-21 DIAGNOSIS — D72829 Elevated white blood cell count, unspecified: Secondary | ICD-10-CM | POA: Diagnosis present

## 2017-03-21 DIAGNOSIS — F419 Anxiety disorder, unspecified: Secondary | ICD-10-CM | POA: Diagnosis present

## 2017-03-21 DIAGNOSIS — Z808 Family history of malignant neoplasm of other organs or systems: Secondary | ICD-10-CM | POA: Diagnosis not present

## 2017-03-21 DIAGNOSIS — Z6841 Body Mass Index (BMI) 40.0 and over, adult: Secondary | ICD-10-CM | POA: Diagnosis not present

## 2017-03-21 DIAGNOSIS — Z79899 Other long term (current) drug therapy: Secondary | ICD-10-CM

## 2017-03-21 DIAGNOSIS — G893 Neoplasm related pain (acute) (chronic): Principal | ICD-10-CM | POA: Diagnosis present

## 2017-03-21 DIAGNOSIS — Z923 Personal history of irradiation: Secondary | ICD-10-CM

## 2017-03-21 DIAGNOSIS — Z7189 Other specified counseling: Secondary | ICD-10-CM

## 2017-03-21 DIAGNOSIS — R443 Hallucinations, unspecified: Secondary | ICD-10-CM | POA: Diagnosis not present

## 2017-03-21 DIAGNOSIS — K5903 Drug induced constipation: Secondary | ICD-10-CM | POA: Diagnosis present

## 2017-03-21 DIAGNOSIS — T40605A Adverse effect of unspecified narcotics, initial encounter: Secondary | ICD-10-CM | POA: Diagnosis present

## 2017-03-21 DIAGNOSIS — T2125XA Burn of second degree of buttock, initial encounter: Secondary | ICD-10-CM | POA: Diagnosis present

## 2017-03-21 DIAGNOSIS — K5909 Other constipation: Secondary | ICD-10-CM | POA: Diagnosis not present

## 2017-03-21 DIAGNOSIS — R110 Nausea: Secondary | ICD-10-CM | POA: Diagnosis present

## 2017-03-21 DIAGNOSIS — C7951 Secondary malignant neoplasm of bone: Secondary | ICD-10-CM | POA: Diagnosis present

## 2017-03-21 DIAGNOSIS — C539 Malignant neoplasm of cervix uteri, unspecified: Secondary | ICD-10-CM | POA: Diagnosis not present

## 2017-03-21 DIAGNOSIS — R402414 Glasgow coma scale score 13-15, 24 hours or more after hospital admission: Secondary | ICD-10-CM | POA: Diagnosis present

## 2017-03-21 DIAGNOSIS — Z8673 Personal history of transient ischemic attack (TIA), and cerebral infarction without residual deficits: Secondary | ICD-10-CM

## 2017-03-21 DIAGNOSIS — R531 Weakness: Secondary | ICD-10-CM | POA: Diagnosis present

## 2017-03-21 DIAGNOSIS — E785 Hyperlipidemia, unspecified: Secondary | ICD-10-CM | POA: Diagnosis present

## 2017-03-21 DIAGNOSIS — C4492 Squamous cell carcinoma of skin, unspecified: Secondary | ICD-10-CM | POA: Diagnosis not present

## 2017-03-21 DIAGNOSIS — R001 Bradycardia, unspecified: Secondary | ICD-10-CM | POA: Diagnosis not present

## 2017-03-21 DIAGNOSIS — Z452 Encounter for adjustment and management of vascular access device: Secondary | ICD-10-CM | POA: Diagnosis not present

## 2017-03-21 DIAGNOSIS — R339 Retention of urine, unspecified: Secondary | ICD-10-CM | POA: Diagnosis present

## 2017-03-21 MED ORDER — ONDANSETRON HCL 4 MG/2ML IJ SOLN
4.0000 mg | Freq: Three times a day (TID) | INTRAMUSCULAR | Status: DC | PRN
  Administered 2017-03-25: 4 mg via INTRAVENOUS
  Filled 2017-03-21: qty 2

## 2017-03-21 MED ORDER — SENNA 8.6 MG PO TABS
1.0000 | ORAL_TABLET | Freq: Two times a day (BID) | ORAL | Status: DC
  Administered 2017-03-21 – 2017-03-22 (×2): 8.6 mg via ORAL
  Filled 2017-03-21 (×2): qty 1

## 2017-03-21 MED ORDER — SODIUM CHLORIDE 0.9 % IV SOLN
INTRAVENOUS | Status: DC
  Administered 2017-03-21 – 2017-04-01 (×9): via INTRAVENOUS

## 2017-03-21 MED ORDER — LIDOCAINE 5 % EX PTCH
1.0000 | MEDICATED_PATCH | CUTANEOUS | Status: DC
  Administered 2017-03-21 – 2017-03-31 (×11): 1 via TRANSDERMAL
  Filled 2017-03-21 (×12): qty 1

## 2017-03-21 MED ORDER — DIPHENHYDRAMINE HCL 50 MG/ML IJ SOLN: 12.5000 mg | Freq: Four times a day (QID) | INTRAMUSCULAR | Status: DC | PRN

## 2017-03-21 MED ORDER — ONDANSETRON HCL 4 MG PO TABS: 4.0000 mg | ORAL_TABLET | Freq: Three times a day (TID) | ORAL | Status: DC | PRN

## 2017-03-21 MED ORDER — ALPRAZOLAM 0.25 MG PO TABS
0.2500 mg | ORAL_TABLET | Freq: Three times a day (TID) | ORAL | Status: DC | PRN
  Administered 2017-03-21 – 2017-03-23 (×4): 0.25 mg via ORAL
  Filled 2017-03-21 (×4): qty 1

## 2017-03-21 MED ORDER — HYDROMORPHONE HCL 4 MG PO TABS
4.0000 mg | ORAL_TABLET | ORAL | Status: DC | PRN
  Administered 2017-03-21: 4 mg via ORAL
  Filled 2017-03-21: qty 1

## 2017-03-21 MED ORDER — SODIUM CHLORIDE 0.9% FLUSH: 9.0000 mL | INTRAVENOUS | Status: DC | PRN

## 2017-03-21 MED ORDER — ALPRAZOLAM 0.5 MG PO TABS: 0.5000 mg | ORAL_TABLET | Freq: Every evening | ORAL | Status: DC | PRN

## 2017-03-21 MED ORDER — DEXAMETHASONE 4 MG PO TABS
4.0000 mg | ORAL_TABLET | Freq: Three times a day (TID) | ORAL | Status: DC
  Administered 2017-03-21 – 2017-03-25 (×12): 4 mg via ORAL
  Filled 2017-03-21 (×13): qty 1

## 2017-03-21 MED ORDER — ADULT MULTIVITAMIN W/MINERALS CH
1.0000 | ORAL_TABLET | Freq: Every day | ORAL | Status: DC
  Administered 2017-03-21 – 2017-04-01 (×12): 1 via ORAL
  Filled 2017-03-21 (×12): qty 1

## 2017-03-21 MED ORDER — HYDROMORPHONE 1 MG/ML IV SOLN
Freq: Every day | INTRAVENOUS | Status: DC
  Administered 2017-03-21: 6 mg via INTRAVENOUS
  Administered 2017-03-21: 7.5 mg via INTRAVENOUS
  Administered 2017-03-21: 18:00:00 via INTRAVENOUS
  Administered 2017-03-22: 6 mg via INTRAVENOUS
  Filled 2017-03-21: qty 25

## 2017-03-21 MED ORDER — FLECAINIDE ACETATE 50 MG PO TABS
50.0000 mg | ORAL_TABLET | Freq: Every day | ORAL | Status: DC
  Administered 2017-03-21 – 2017-04-01 (×12): 50 mg via ORAL
  Filled 2017-03-21 (×12): qty 1

## 2017-03-21 MED ORDER — ONDANSETRON 4 MG PO TBDP: 4.0000 mg | ORAL_TABLET | Freq: Three times a day (TID) | ORAL | Status: DC | PRN

## 2017-03-21 MED ORDER — MORPHINE SULFATE (PF) 10 MG/ML IV SOLN: 10.0000 mg | INTRAVENOUS | Status: DC | PRN

## 2017-03-21 MED ORDER — SODIUM CHLORIDE 0.9 % IV SOLN
8.0000 mg | Freq: Three times a day (TID) | INTRAVENOUS | Status: DC | PRN
  Filled 2017-03-21: qty 4

## 2017-03-21 MED ORDER — POLYETHYLENE GLYCOL 3350 17 G PO PACK
17.0000 g | PACK | Freq: Every day | ORAL | Status: DC
  Administered 2017-03-21 – 2017-03-31 (×9): 17 g via ORAL
  Filled 2017-03-21 (×10): qty 1

## 2017-03-21 MED ORDER — RIVAROXABAN 20 MG PO TABS
20.0000 mg | ORAL_TABLET | Freq: Every day | ORAL | Status: DC
  Administered 2017-03-21: 20 mg via ORAL
  Filled 2017-03-21: qty 1

## 2017-03-21 MED ORDER — ONDANSETRON HCL 4 MG/2ML IJ SOLN: 4.0000 mg | Freq: Four times a day (QID) | INTRAMUSCULAR | Status: DC | PRN

## 2017-03-21 MED ORDER — GABAPENTIN 300 MG PO CAPS
300.0000 mg | ORAL_CAPSULE | Freq: Three times a day (TID) | ORAL | Status: DC
  Administered 2017-03-21 – 2017-03-30 (×29): 300 mg via ORAL
  Filled 2017-03-21 (×29): qty 1

## 2017-03-21 MED ORDER — ACETAMINOPHEN 325 MG PO TABS
650.0000 mg | ORAL_TABLET | ORAL | Status: DC | PRN
  Administered 2017-03-21 – 2017-03-27 (×2): 650 mg via ORAL
  Filled 2017-03-21 (×2): qty 2

## 2017-03-21 MED ORDER — MORPHINE SULFATE (PF) 10 MG/ML IV SOLN
10.0000 mg | INTRAVENOUS | Status: DC | PRN
  Administered 2017-03-21 (×2): 10 mg via INTRAVENOUS
  Filled 2017-03-21 (×2): qty 1

## 2017-03-21 MED ORDER — METOPROLOL TARTRATE 50 MG PO TABS
50.0000 mg | ORAL_TABLET | Freq: Every day | ORAL | Status: DC
  Administered 2017-03-21: 50 mg via ORAL
  Filled 2017-03-21: qty 1

## 2017-03-21 MED ORDER — DIPHENHYDRAMINE HCL 12.5 MG/5ML PO ELIX
12.5000 mg | ORAL_SOLUTION | Freq: Four times a day (QID) | ORAL | Status: DC | PRN
  Administered 2017-03-22: 12.5 mg via ORAL
  Filled 2017-03-21: qty 5

## 2017-03-21 MED ORDER — CITALOPRAM HYDROBROMIDE 20 MG PO TABS
20.0000 mg | ORAL_TABLET | Freq: Every day | ORAL | Status: DC
  Administered 2017-03-21 – 2017-04-01 (×12): 20 mg via ORAL
  Filled 2017-03-21 (×12): qty 1

## 2017-03-21 MED ORDER — NALOXONE HCL 0.4 MG/ML IJ SOLN: 0.4000 mg | INTRAMUSCULAR | Status: DC | PRN

## 2017-03-21 MED ORDER — HYDROMORPHONE HCL 1 MG/ML IJ SOLN
1.0000 mg | INTRAMUSCULAR | Status: DC | PRN
  Administered 2017-03-21 – 2017-03-23 (×9): 1 mg via INTRAVENOUS
  Filled 2017-03-21 (×5): qty 1

## 2017-03-21 MED ORDER — FUROSEMIDE 40 MG PO TABS
40.0000 mg | ORAL_TABLET | Freq: Every day | ORAL | Status: DC
  Administered 2017-03-22 – 2017-04-01 (×11): 40 mg via ORAL
  Filled 2017-03-21 (×12): qty 1

## 2017-03-21 MED ORDER — MORPHINE SULFATE ER 30 MG PO TBCR
60.0000 mg | EXTENDED_RELEASE_TABLET | Freq: Three times a day (TID) | ORAL | Status: DC
  Administered 2017-03-21 – 2017-03-23 (×6): 60 mg via ORAL
  Filled 2017-03-21 (×6): qty 2

## 2017-03-21 NOTE — Progress Notes (Signed)
[facility] Progress Note   Cindy Faulkner [id number] 27-Jan-1966 52 y.o.  Cindy Faulkner is managed by Dr. [name]  Actively treated with chemotherapy: no   Assessment: Plan:    Metastasis to liver of unknown origin [facility])  Neoplasm related pain - Plan: morphine (MS CONTIN) 60 MG 12 hr tablet   Metastasis to the liver of unknown origin: Patient has a follow-up appointment Dr. [name] on 03/21/2017.  Neoplastic-related pain: The patient is currently on MS Contin 30 mg p.o. 3 times daily and is taking Dilaudid 4 mg every 4 hours as needed for pain.  Additionally the patient is taking gabapentin 300 mg p.o. 3 times daily.  Despite this she continues to have significant abdominal pain.  Plans are to increase her MS Contin to 60 mg.  I have instructed the patient to attempt to decrease her Dilaudid for breakthrough pain.  I also expressed to them that I am hopeful that she can transition from 3 times daily dosing of MS Contin to twice daily dosing.  Please see After Visit Summary for patient specific instructions.  Future Appointments  Date Time Provider [place]  03/25/2017  7:30 AM [facility] ROOM [facility] None  03/25/2017  9:30 AM [facility] 1 [facility] [place]  08/18/2017 10:00 AM Cameron Sprang, MD [facility] None    No orders of the defined types were placed in this encounter.      Subjective:   Patient ID:  Cindy Faulkner is a 52 y.o. (DOB 1965-06-22) female.  Chief Complaint:  Chief Complaint  Patient presents with  . Pain    HPI Cindy Faulkner is a 52 year old female with a diagnosis of metastatic disease to the liver of unknown primary.  A preliminary biopsy confirmed that the patient has a squamous cell carcinoma likely originating from a probable cervical cancer.  The patient was last seen by Dr. [name] on 03/11/2017 and presents to the office today for pain management.  She was seen earlier today in Gyn-Onc.  She has been taking MS Contin 30 mg  every 8 hours and has been taking Dilaudid 4 mg every 4 hours for pain.  She has been taking both of these regularly.  Additionally she has been taking gabapentin 300 mg p.o. 3 times daily.  Despite this her pain is poorly controlled.  She has been having right upper abdominal pain and has right posterior hip pain.  She is agreeable to go up on her MS Contin dose and attempt to decrease her dosing from every 8 hours to every 12 hours as well as to attempt to decrease her use of Dilaudid.  She has urinary frequency and a sensation that she is not emptying her bladder completely secondary to pressure from a mass on her bladder.  She has to strain to urinate.  She denies dysuria, cloudy or foul-smelling urine.  She is scheduled to see Dr. Alvy Bimler on 03/21/2017.  She is using MiraLAX and stool softeners regularly.  Medications: I have reviewed the patient's current medications.  Allergies: No Known Allergies  Past Medical History:  Diagnosis Date  . Atrial fibrillation ([place])    a. echo 4/07: EF 60%  . Atrial tachycardia ([place])    a. s/p EPS 4/09: no inducible SVT - med Tx continued  . Complication of anesthesia    age 52yo, woke during procedure with GA, paralysed   . Endometrial polyp   . Hyperlipidemia   . Hypertension   .  PONV (postoperative nausea and vomiting)   . Rectocele    WITHOUT MENTION OF UTERINE PROLAPSE  . Stroke ([place])   . SVD (spontaneous vaginal delivery)    x 1    Past Surgical History:  Procedure Laterality Date  . COLONOSCOPY    . HYSTEROSCOPY    . LEEP    . Pylonidal cystect    . TUBAL LIGATION    . WISDOM TOOTH EXTRACTION      Family History  Problem Relation Age of Onset  . Hypertension Father   . Diabetes Father   . Stroke Father   . Hypertension Mother   . Stroke Mother   . Cancer Mother        cervical ca  . Cancer Sister 51       uterine ca  . Cancer Maternal Grandmother        lung ca    Social History   Socioeconomic History  . Marital  status: Married    Spouse name: Kasandra Knudsen  . Number of children: 1  . Years of education: Not on file  . Highest education level: Not on file  Social Needs  . Financial resource strain: Not on file  . Food insecurity - worry: Not on file  . Food insecurity - inability: Not on file  . Transportation needs - medical: Not on file  . Transportation needs - non-medical: Not on file  Occupational History  . Occupation: nurse  Tobacco Use  . Smoking status: Former Smoker    Packs/day: 0.50    Years: 10.00    Pack years: 5.00    Types: Cigarettes    Last attempt to quit: 02/22/1998    Years since quitting: 19.0  . Smokeless tobacco: Never Used  Substance and Sexual Activity  . Alcohol use: No  . Drug use: No  . Sexual activity: Not on file  Other Topics Concern  . Not on file  Social History Narrative  . Not on file    Past Medical History, Surgical history, Social history, and Family history were reviewed and updated as appropriate.   Please see review of systems for further details on the patient's review from today.   Review of Systems:  Review of Systems  Constitutional: Positive for activity change.  Gastrointestinal: Positive for abdominal pain. Negative for constipation, diarrhea, nausea and vomiting.  Genitourinary: Positive for decreased urine volume and frequency.  Musculoskeletal: Positive for arthralgias.    Objective:   Physical Exam:  BP (!) 142/92 (BP Location: Left Arm, Patient Position: Sitting)   Pulse 93   Temp 100.2 F (37.9 C)   Resp 20   Ht 5\' 7"  (1.702 m)   Wt 270 lb (122.5 kg)   LMP 02/12/2012   SpO2 95%   BMI 42.29 kg/m  ECOG: 1  Physical Exam  Constitutional: No distress.  HENT:  Head: Normocephalic and atraumatic.  Cardiovascular: Normal rate, regular rhythm and normal heart sounds. Exam reveals no gallop and no friction rub.  No murmur heard. Pulmonary/Chest: Effort normal and breath sounds normal. No respiratory distress. She has no  wheezes. She has no rales.  Abdominal: Soft. Bowel sounds are normal. She exhibits no distension. There is tenderness (Bilateral lower pelvis). There is no rebound and no guarding.  Musculoskeletal: She exhibits no tenderness or deformity.  Neurological: She is alert. Coordination normal.  Skin: Skin is warm and dry. She is not diaphoretic.    Lab Review:     Component Value Date/Time  NA 132 (L) 02/28/2017 0359   K 4.2 02/28/2017 0359   CL 98 (L) 02/28/2017 0359   CO2 23 02/28/2017 0359   GLUCOSE 266 (H) 02/28/2017 0359   BUN 16 02/28/2017 0359   CREATININE 0.76 02/28/2017 0359   CALCIUM 8.9 02/28/2017 0359   PROT 7.0 02/28/2017 0359   ALBUMIN 3.2 (L) 02/28/2017 0359   AST 24 02/28/2017 0359   ALT 24 02/28/2017 0359   ALKPHOS 87 02/28/2017 0359   BILITOT 0.5 02/28/2017 0359   GFRNONAA >60 02/28/2017 0359   GFRAA >60 02/28/2017 0359       Component Value Date/Time   WBC 12.0 (H) 03/09/2017 1034   RBC 4.06 03/09/2017 1034   HGB 11.2 (L) 03/09/2017 1034   HCT 33.9 (L) 03/09/2017 1034   PLT 362 03/09/2017 1034   MCV 83.6 03/09/2017 1034   MCH 27.6 03/09/2017 1034   MCHC 33.0 03/09/2017 1034   RDW 14.6 (H) 03/09/2017 1034   LYMPHSABS 3.7 02/28/2017 0359   MONOABS 1.2 (H) 02/28/2017 0359   EOSABS 0.0 02/28/2017 0359   BASOSABS 0.0 02/28/2017 0359   -------------------------------  Imaging from last 24 hours (if applicable):  Radiology interpretation: Dg Chest 2 View  Result Date: 02/28/2017 CLINICAL DATA:  52 y/o  F; shortness of breath. EXAM: CHEST  2 VIEW COMPARISON:  02/18/2012 chest radiograph FINDINGS: Stable heart size and mediastinal contours are within normal limits. Minor atelectasis in lung bases. No consolidation. The visualized skeletal structures are unremarkable. IMPRESSION: Minor atelectasis in lung bases.  No consolidation. Electronically Signed   By: Kristine Garbe M.D.   On: 02/28/2017 04:39   Ct Chest W Contrast  Result Date:  03/03/2017 CLINICAL DATA:  Generalized intra-abdominal and pelvic swelling. Mass and lump. EXAM: CT CHEST, ABDOMEN, AND PELVIS WITH CONTRAST TECHNIQUE: Multidetector CT imaging of the chest, abdomen and pelvis was performed following the standard protocol during bolus administration of intravenous contrast. CONTRAST:  <See Chart> ISOVUE-300 IOPAMIDOL (ISOVUE-300) INJECTION 61% COMPARISON:  None. FINDINGS: CT CHEST FINDINGS Cardiovascular: Normal heart size.  No pericardial effusion. Mediastinum/Nodes: The trachea is patent and midline. Normal appearance of the esophagus. No enlarged mediastinal or hilar lymph nodes. No axillary or supraclavicular adenopathy. Lungs/Pleura: No pleural effusions. Platelike atelectasis versus scar identified within the right middle lobe, right lower lobe and lingula. No suspicious pulmonary nodules or masses. Musculoskeletal: Degenerative disc disease identified within the thoracic spine. CT ABDOMEN PELVIS FINDINGS Hepatobiliary: Diffuse hepatic steatosis. Peripherally enhancing lesion within the lateral segment of left lobe of liver measures 2.3 cm, image 56 of series 2. Anterior dome of liver lesion measures 1.8 cm, image 42 of series 2. There are at least scratch set multiple lesions within the right lobe of liver noted. The largest is in segment 7 measuring 10.3 by 5.2 x 5.9 cm, image 59 of series 2. The gallbladder appears normal. Pancreas: Unremarkable. No pancreatic ductal dilatation or surrounding inflammatory changes. Spleen: Normal in size without focal abnormality. Adrenals/Urinary Tract: The adrenal glands are normal. Small nonobstructing right renal calculi noted within the interpolar kidney. No mass or hydronephrosis. Urinary bladder appears normal. Stomach/Bowel: Stomach is within normal limits. Appendix appears normal. No evidence of bowel wall thickening, distention, or inflammatory changes. Vascular/Lymphatic: No significant vascular findings are present. No enlarged  abdominal or pelvic lymph nodes. Reproductive: Uterus and bilateral adnexa are unremarkable. Other: Left posterior pelvic mass extending into the obturator foramen measures 7 by 4.9 cm, image 115 of series 2. No surrounding bone changes identified. Musculoskeletal:  No aggressive lytic or sclerotic bone lesions. IMPRESSION: 1. Multifocal liver metastasis. 2. Left posterior pelvic mass extending into the obturator foramen measures up to 7 cm. Suspicious for malignancy. This should be easily amendable to percutaneous tissue sampling under image guidance. 3. No lytic bone lesions identified of myeloma. Electronically Signed   By: Kerby Moors M.D.   On: 03/03/2017 11:21   Ct Abdomen Pelvis W Contrast  Result Date: 03/03/2017 CLINICAL DATA:  Generalized intra-abdominal and pelvic swelling. Mass and lump. EXAM: CT CHEST, ABDOMEN, AND PELVIS WITH CONTRAST TECHNIQUE: Multidetector CT imaging of the chest, abdomen and pelvis was performed following the standard protocol during bolus administration of intravenous contrast. CONTRAST:  <See Chart> ISOVUE-300 IOPAMIDOL (ISOVUE-300) INJECTION 61% COMPARISON:  None. FINDINGS: CT CHEST FINDINGS Cardiovascular: Normal heart size.  No pericardial effusion. Mediastinum/Nodes: The trachea is patent and midline. Normal appearance of the esophagus. No enlarged mediastinal or hilar lymph nodes. No axillary or supraclavicular adenopathy. Lungs/Pleura: No pleural effusions. Platelike atelectasis versus scar identified within the right middle lobe, right lower lobe and lingula. No suspicious pulmonary nodules or masses. Musculoskeletal: Degenerative disc disease identified within the thoracic spine. CT ABDOMEN PELVIS FINDINGS Hepatobiliary: Diffuse hepatic steatosis. Peripherally enhancing lesion within the lateral segment of left lobe of liver measures 2.3 cm, image 56 of series 2. Anterior dome of liver lesion measures 1.8 cm, image 42 of series 2. There are at least scratch set  multiple lesions within the right lobe of liver noted. The largest is in segment 7 measuring 10.3 by 5.2 x 5.9 cm, image 59 of series 2. The gallbladder appears normal. Pancreas: Unremarkable. No pancreatic ductal dilatation or surrounding inflammatory changes. Spleen: Normal in size without focal abnormality. Adrenals/Urinary Tract: The adrenal glands are normal. Small nonobstructing right renal calculi noted within the interpolar kidney. No mass or hydronephrosis. Urinary bladder appears normal. Stomach/Bowel: Stomach is within normal limits. Appendix appears normal. No evidence of bowel wall thickening, distention, or inflammatory changes. Vascular/Lymphatic: No significant vascular findings are present. No enlarged abdominal or pelvic lymph nodes. Reproductive: Uterus and bilateral adnexa are unremarkable. Other: Left posterior pelvic mass extending into the obturator foramen measures 7 by 4.9 cm, image 115 of series 2. No surrounding bone changes identified. Musculoskeletal: No aggressive lytic or sclerotic bone lesions. IMPRESSION: 1. Multifocal liver metastasis. 2. Left posterior pelvic mass extending into the obturator foramen measures up to 7 cm. Suspicious for malignancy. This should be easily amendable to percutaneous tissue sampling under image guidance. 3. No lytic bone lesions identified of myeloma. Electronically Signed   By: Kerby Moors M.D.   On: 03/03/2017 11:21   US Biopsy (liver)  Result Date: 03/09/2017 CLINICAL DATA:  Lytic presacral lesion. Multiple liver lesions suggesting metastatic disease. EXAM: ULTRASOUND GUIDED CORE BIOPSY OF LIVER LESION MEDICATIONS: Intravenous Fentanyl and Versed were administered as conscious sedation during continuous monitoring of the patient's level of consciousness and physiological / cardiorespiratory status by the radiology RN, with a total moderate sedation time of 13 minutes. PROCEDURE: The procedure, risks, benefits, and alternatives were explained to  the patient. Questions regarding the procedure were encouraged and answered. The patient understands and consents to the procedure. Survey ultrasound of the liver was performed. A representative lesion corresponding to CT findings was localized. Skin entry site was determined and marked. The operative field was prepped with chlorhexidine in a sterile fashion, and a sterile drape was applied covering the operative field. A sterile gown and sterile gloves were used for the procedure. Local  anesthesia was provided with 1% Lidocaine. Under real-time ultrasound guidance, a 17 gauge trocar needle was advanced to the margin of the lesion. Once needle tip position was confirmed, coaxial 18-gauge core biopsy samples were obtained, submitted in formalin to surgical pathology. The guide needle was removed. Postprocedure scans show no hemorrhage or other apparent complication. The patient tolerated the procedure well. COMPLICATIONS: None. FINDINGS: Hypoechoic liver lesions corresponding to CT findings were localized. Representative core biopsy samples obtained as above. IMPRESSION: 1. Technically successful ultrasound-guided core liver lesion biopsy Electronically Signed   By: Lucrezia Europe M.D.   On: 03/09/2017 17:01

## 2017-03-21 NOTE — Telephone Encounter (Signed)
[facility] pathology notified of request below. Need faxed form.

## 2017-03-21 NOTE — Progress Notes (Addendum)
  Radiation Oncology         [phone] ________________________________  Name: Cindy Faulkner MRN: [id number]  Date: 03/21/2017  DOB: December 16, 1965  SIMULATION AND TREATMENT PLANNING NOTE    ICD-10-CM   1. Metastasis to liver of unknown origin ([place]) C78.7    C80.1     DIAGNOSIS:  52 yo woman with painful left pelvic metastasis from unknown primary malignancy.    NARRATIVE:  The patient was brought to the [facility].  Identity was confirmed.  All relevant records and images related to the planned course of therapy were reviewed.  The patient freely provided informed written consent to proceed with treatment after reviewing the details related to the planned course of therapy. The consent form was witnessed and verified by the simulation staff.  Then, the patient was set-up in a stable reproducible  supine position for radiation therapy.  CT images were obtained.  Surface markings were placed.  The CT images were loaded into the planning software.  Then the target and avoidance structures were contoured.  Treatment planning then occurred.  The radiation prescription was entered and confirmed.  Then, I designed and supervised the construction of a total of 5 medically necessary complex treatment devices.  I have requested : Isodose Plan.    PLAN:  The patient will receive 30 Gy in 10 fractions.  ________________________________  Sheral Apley Tammi Klippel, M.D.

## 2017-03-21 NOTE — Telephone Encounter (Signed)
Order faxed to [facility] pathology

## 2017-03-21 NOTE — Progress Notes (Signed)
This encounter was created in error - please disregard.

## 2017-03-21 NOTE — Consult Note (Signed)
Consultation Note Date: 03/21/2017   Patient Name: Cindy Faulkner  DOB: 1965-03-20  MRN: [id number]  Age / Sex: 52 y.o., female  PCP: Lennie Odor, PA-C Referring Physician: [name], MD  Reason for Consultation: Pain control  HPI/Patient Profile: 51 y.o. female   admitted on 03/21/2017     Clinical Assessment and Goals of Care:  Ms Norrington is a 52 yo with newly diagnosed metastatic squamous cell carcinoma. Ms Titsworth has been having back and pelvic pain for the past 12 weeks, MRI showed a mass in pelvis, CT showing liver metastases, left posterior pelvic mass, liver biopsy with invasive squamous cell carcinoma. Patient has been admitted from medical oncology after her appointment for uncontrolled pain, fever, weakness, anxiety. A palliative consult has been requested for pain and non pain symptom management.   I met with Ms Skaff, I introduced myself and palliative care as follows: Palliative medicine is specialized medical care for people living with serious illness. It focuses on providing relief from the symptoms and stress of a serious illness. The goal is to improve quality of life for both the patient and the family.  We discussed about symptom relief in detail, offered active listening and supportive care see below, thank you for the consult   NEXT OF KIN  husband, mother, daughter, niece.   SUMMARY OF RECOMMENDATIONS    1. PAIN: Agree with current dose of scheduled MS Contin 60 mg PO TID+ PO PRN Dilaudid. For rescue/ break through, will opioid rotate from 10 mg IV Morphine PRN to Dilaudid IV PRN and monitor. Agree with Dr Alvy Bimler, low threshold to start bolus only Dilaudid PCA, should the patient require multiple dosages of break through IV Dilaudid. Agree with adjuvants such as prednisone and Neurontin. Add Lidoderm patch, add heating pad PRN and monitor. Appreciate rad onc input.   2. CONSTIPATION:  Agree with current bowel regimen: Miralax+Sennokot. Consider Dulcolax rectal suppository/enema if no results in the next 24 hours or so.   3. ANXIETY: Agree with Xanax and Celexa, add 0.5 mg PO scheduled Xanax HS and continue to monitor.   4. NAUSEA: continue Zofran made available both PO and IV PRN and monitor.   Thank you for the consult, PMT to closely follow along.   Code Status/Advance Care Planning:  Full code    Symptom Management:    as above  Palliative Prophylaxis:   Bowel Regimen  Additional Recommendations (Limitations, Scope, Preferences):  Full Scope Treatment  Psycho-social/Spiritual:   Desire for further Chaplaincy support:no  Additional Recommendations: Caregiving  Support/Resources  Prognosis:   Unable to determine  Discharge Planning: To Be Determined      Primary Diagnoses: Present on Admission: . Metastasis to liver ([place]) . Sacral mass . Cancer of unknown origin ([place]) . Cancer associated pain . Cervical cancer ([place])   I have reviewed the medical record, interviewed the patient and family, and examined the patient. The following aspects are pertinent.  Past Medical History:  Diagnosis Date  . Atrial fibrillation ([place])  a. echo 4/07: EF 60%  . Atrial tachycardia ([place])    a. s/p EPS 4/09: no inducible SVT - med Tx continued  . Complication of anesthesia    age 45yo, woke during procedure with GA, paralysed   . Endometrial polyp   . Hyperlipidemia   . Hypertension   . PONV (postoperative nausea and vomiting)   . Rectocele    WITHOUT MENTION OF UTERINE PROLAPSE  . Stroke ([place])   . SVD (spontaneous vaginal delivery)    x 1   Social History   Socioeconomic History  . Marital status: Married    Spouse name: Kasandra Knudsen  . Number of children: 1  . Years of education: None  . Highest education level: None  Social Needs  . Financial resource strain: None  . Food insecurity - worry: None  . Food insecurity - inability: None  .  Transportation needs - medical: None  . Transportation needs - non-medical: None  Occupational History  . Occupation: nurse  Tobacco Use  . Smoking status: Former Smoker    Packs/day: 0.50    Years: 10.00    Pack years: 5.00    Types: Cigarettes    Last attempt to quit: 02/22/1998    Years since quitting: 19.0  . Smokeless tobacco: Never Used  Substance and Sexual Activity  . Alcohol use: No  . Drug use: No  . Sexual activity: None  Other Topics Concern  . None  Social History Narrative  . None   Family History  Problem Relation Age of Onset  . Hypertension Father   . Diabetes Father   . Stroke Father   . Hypertension Mother   . Stroke Mother   . Cancer Mother        cervical ca  . Cancer Sister 36       uterine ca  . Cancer Maternal Grandmother        lung ca   Scheduled Meds: . citalopram  20 mg Oral Daily  . dexamethasone  4 mg Oral Q8H  . flecainide  50 mg Oral Daily  . furosemide  40 mg Oral Daily  . gabapentin  300 mg Oral TID  . lidocaine  1 patch Transdermal Q24H  . metoprolol tartrate  50 mg Oral Daily  . morphine  60 mg Oral Q8H  . multivitamin with minerals  1 tablet Oral Daily  . polyethylene glycol  17 g Oral Daily  . rivaroxaban  20 mg Oral Q breakfast  . senna  1 tablet Oral BID   Continuous Infusions: . sodium chloride 50 mL/hr at 03/21/17 1119  . ondansetron (ZOFRAN) IV     PRN Meds:.acetaminophen, ALPRAZolam, ALPRAZolam, HYDROmorphone (DILAUDID) injection, HYDROmorphone, ondansetron **OR** ondansetron **OR** ondansetron (ZOFRAN) IV **OR** ondansetron (ZOFRAN) IV Medications Prior to Admission:  Prior to Admission medications   Medication Sig Start Date End Date Taking? Authorizing Provider  acetaminophen (TYLENOL) 500 MG tablet Take 1,000 mg by mouth every 6 (six) hours as needed for mild pain.   Yes [provider]  ALPRAZolam (XANAX) 0.25 MG tablet Take 0.25 mg by mouth 3 (three) times daily as needed for sleep.    Yes [provider]  atorvastatin (LIPITOR) 40 MG tablet Take 1 tablet (40 mg total) by mouth daily at 6 PM. 12/06/15  Yes Dhungel, Nishant, MD  Biotin 1000 MCG tablet Take 1,000 mcg by mouth daily.   Yes [provider]  cholecalciferol (VITAMIN D) 1000 units tablet Take 2,000 Units by  mouth daily.   Yes [provider]  citalopram (CELEXA) 20 MG tablet Take 20 mg by mouth daily.   Yes [provider]  flecainide (TAMBOCOR) 50 MG tablet Take 1 tablet (50 mg total) by mouth daily. 09/14/16  Yes Evans Lance, MD  furosemide (LASIX) 40 MG tablet Take 40 mg by mouth daily.   Yes [provider]  gabapentin (NEURONTIN) 300 MG capsule Take 300 mg by mouth 3 (three) times daily. 02/28/17  Yes [provider]  Glucosamine-Chondroitin (GLUCOSAMINE CHONDR COMPLEX PO) Take 2 capsules by mouth daily.    Yes [provider]  HYDROmorphone (DILAUDID) 4 MG tablet Take 1 tablet (4 mg total) by mouth every 4 (four) hours as needed. 03/18/17  Yes Everitt Amber, MD  metoprolol tartrate (LOPRESSOR) 50 MG tablet Take 1 tablet (50 mg total) by mouth daily. 09/14/16  Yes Evans Lance, MD  morphine (MS CONTIN) 60 MG 12 hr tablet Take 1 tablet (60 mg total) by mouth every 8 (eight) hours. 03/18/17  Yes Harle Stanford., PA-C  Multiple Vitamin (MULTIVITAMIN) tablet Take 2 tablets by mouth daily.    Yes [provider]  Omega-3 Fatty Acids (FISH OIL) 1000 MG CAPS Take 2 capsules by mouth daily.     Yes [provider]  oxymetazoline (AFRIN) 0.05 % nasal spray Place 2 sprays into both nostrils 2 (two) times daily as needed for congestion.   Yes [provider]  rivaroxaban (XARELTO) 20 MG TABS tablet Take 1 tablet (20 mg total) by mouth daily. 12/06/15  Yes Dhungel, Nishant, MD  Turmeric (CURCUMIN 95) 500 MG CAPS Take 3 capsules by mouth daily.    Yes [provider]  valACYclovir (VALTREX) 1000 MG tablet Take 1 tablet (1,000 mg total) by mouth 3  (three) times daily. 03/11/17  Yes [name], MD   No Known Allergies Review of Systems +pain in lower back/sacral area  Physical Exam Awake alert resting in bed Feels sweaty, some what clammy, has had a fever for the past 2 days Clear breath sounds Abdomen soft, not tender Has pain in lower back, across sacral area No edema Awake alert non focal Moist oral mucosa  Vital Signs: BP (!) 148/77 (BP Location: Left Arm)   Pulse 81   Temp (!) 101.8 F (38.8 C) (Oral)   Resp 18   Ht 5' 7" (1.702 m)   Wt 122.5 kg (270 lb)   LMP 02/12/2012   SpO2 97%   BMI 42.29 kg/m      Pain Score: 4    SpO2: SpO2: 97 % O2 Device:SpO2: 97 % O2 Flow Rate: .   IO: Intake/output summary: No intake or output data in the 24 hours ending 03/21/17 1340  LBM:   2 days ago PPS 40% Baseline Weight: Weight: 122.5 kg (270 lb) Most recent weight: Weight: 122.5 kg (270 lb)     Palliative Assessment/Data:   Flowsheet Rows     Most Recent Value  Intake Tab  Referral Department  Hospitalist  Unit at Time of Referral  Oncology Unit  Palliative Care Primary Diagnosis  Cancer  Palliative Care Type  New Palliative care  Reason for referral  Pain  Date first seen by Palliative Care  03/21/17  Clinical Assessment  Palliative Performance Scale Score  40%  Pain Max last 24 hours  6  Pain Min Last 24 hours  4  Dyspnea Max Last 24 Hours  4  Dyspnea Min Last 24 hours  3  Nausea Max Last 24 Hours  6  Nausea Min Last 24 Hours  4  Psychosocial & Spiritual Assessment  Palliative Care Outcomes  Patient/Family meeting held?  Yes  Who was at the meeting?  patient, husband, daughter, niece, mother       Time In:  17 Time Out:  1500 Time Total:  26    Greater than 50%  of this time was spent counseling and coordinating care related to the above assessment and plan.  Signed by: Loistine Chance, MD  [phone]  Please contact Palliative Medicine Team phone at [phone] for questions and concerns.  For  individual provider: See Shea Evans

## 2017-03-21 NOTE — Progress Notes (Signed)
Phoned Irfa, RN caring for patient. Explained radiation transporters will be up at 1445 to transport patient down for a 1500 simulation. Irfa, RN committed to premedicating her with patient medication at 1430. Irfa, RN confirms the patient has no precautions and saline infusing.

## 2017-03-21 NOTE — H&P (Signed)
[facility] ADMISSION NOTE  Patient Care Team: Cleda Mccreedy as PCP - General (Nurse Practitioner) Arvella Nigh, MD as Obstetrician (Obstetrics and Gynecology) Evans Lance, MD as Consulting Physician (Cardiology)  CHIEF COMPLAINTS/PURPOSE OF ADMISSION Severe, uncontrolled cancer pain  HISTORY OF PRESENTING ILLNESS:  Cindy Faulkner 52 y.o. female is admitted for management of severe, uncontrolled cancer pain Summary of oncologic history as follows:   Cancer of unknown origin ([place])   03/03/2017 Imaging    1. Multifocal liver metastasis. 2. Left posterior pelvic mass extending into the obturator foramen measures up to 7 cm. Suspicious for malignancy. This should be easily amendable to percutaneous tissue sampling under image guidance. 3. No lytic bone lesions identified of myeloma.      03/09/2017 Procedure    Technically successful ultrasound-guided core liver lesion biopsy      03/09/2017 Pathology Results    A. LIVER LESION, RIGHT LOBE; ULTRASOUND-GUIDED BIOPSY:  - METASTATIC SQUAMOUS CELL CARCINOMA, SEE COMMENT.   Comment: Metastatic carcinoma is immunoreactive for p40, p16, p53 and pancytokeratin while CK7, CK20, and TTF1 are negative. Significance of p16 positivity depends on the site of origin and the pattern of immunoreactivity in this material is non-specific. Possible sites of origin include gynecologic, anal, lung, and head/neck. Correlation with physical exam and additional imaging is required.       The patient was seen by GYN oncologist last week for evaluation of anogenital region to determine the source of squamous cell carcinoma. She was noted to be in severe pain and was seen at symptom management clinic. The dose of MS Contin was increased to 60 mg 3 times a day along with Dilaudid for breakthrough According to the patient and family members, she needed to take multiple breakthrough pain medicine every 2-3 hours and still her pain is not  well controlled. This morning, her family members did not give her any pain medicine in anticipation for discussion about plan of care. They have concerns that she might be too sedated if she take her pain medicine. The patient also complained of urinary retention.  She had mild constipation, last bowel movement was 2 days ago.  She denies nausea.  Overall, the patient is very miserable with pain and was unable to sit still.  She rated her pain as severe, centered around her genital region radiating down to her leg.  She also have intermittent right upper quadrant pain.  MEDICAL HISTORY:  Past Medical History:  Diagnosis Date  . Atrial fibrillation ([place])    a. echo 4/07: EF 60%  . Atrial tachycardia ([place])    a. s/p EPS 4/09: no inducible SVT - med Tx continued  . Complication of anesthesia    age 39yo, woke during procedure with GA, paralysed   . Endometrial polyp   . Hyperlipidemia   . Hypertension   . PONV (postoperative nausea and vomiting)   . Rectocele    WITHOUT MENTION OF UTERINE PROLAPSE  . Stroke ([place])   . SVD (spontaneous vaginal delivery)    x 1    SURGICAL HISTORY: Past Surgical History:  Procedure Laterality Date  . COLONOSCOPY    . HYSTEROSCOPY    . LEEP    . Pylonidal cystect    . TUBAL LIGATION    . WISDOM TOOTH EXTRACTION      SOCIAL HISTORY: Social History   Socioeconomic History  . Marital status: Married    Spouse name: Kasandra Knudsen  . Number of children: 1  .  Years of education: Not on file  . Highest education level: Not on file  Social Needs  . Financial resource strain: Not on file  . Food insecurity - worry: Not on file  . Food insecurity - inability: Not on file  . Transportation needs - medical: Not on file  . Transportation needs - non-medical: Not on file  Occupational History  . Occupation: nurse  Tobacco Use  . Smoking status: Former Smoker    Packs/day: 0.50    Years: 10.00    Pack years: 5.00    Types: Cigarettes    Last attempt to  quit: 02/22/1998    Years since quitting: 19.0  . Smokeless tobacco: Never Used  Substance and Sexual Activity  . Alcohol use: No  . Drug use: No  . Sexual activity: Not on file  Other Topics Concern  . Not on file  Social History Narrative  . Not on file    FAMILY HISTORY: Family History  Problem Relation Age of Onset  . Hypertension Father   . Diabetes Father   . Stroke Father   . Hypertension Mother   . Stroke Mother   . Cancer Mother        cervical ca  . Cancer Sister 72       uterine ca  . Cancer Maternal Grandmother        lung ca    ALLERGIES:  has No Known Allergies.  MEDICATIONS:  Current Facility-Administered Medications  Medication Dose Route Frequency Provider Last Rate Last Dose  . 0.9 %  sodium chloride infusion   Intravenous Continuous Alvy Bimler, Quin Mcpherson, MD      . acetaminophen (TYLENOL) tablet 650 mg  650 mg Oral Q4H PRN Alvy Bimler, Mayfield Schoene, MD      . ALPRAZolam Duanne Moron) tablet 0.25 mg  0.25 mg Oral TID PRN Alvy Bimler, Joshuah Minella, MD      . citalopram (CELEXA) tablet 20 mg  20 mg Oral Daily Gabryella Murfin, MD      . flecainide (TAMBOCOR) tablet 50 mg  50 mg Oral Daily Kelise Kuch, MD      . furosemide (LASIX) tablet 40 mg  40 mg Oral Daily Valen Mascaro, MD      . gabapentin (NEURONTIN) capsule 300 mg  300 mg Oral TID Alvy Bimler, Errin Whitelaw, MD      . HYDROmorphone (DILAUDID) tablet 4 mg  4 mg Oral Q2H PRN Alvy Bimler, Milo Schreier, MD      . metoprolol tartrate (LOPRESSOR) tablet 50 mg  50 mg Oral Daily Abdi Husak, MD      . morphine (MS CONTIN) 12 hr tablet 60 mg  60 mg Oral Q8H Teniola Tseng, MD      . Morphine Sulfate (PF) SOLN 10 mg  10 mg Intravenous Q1H PRN Alvy Bimler, Dewight Catino, MD      . multivitamin tablet 1 tablet  1 tablet Oral Daily Yasmene Salomone, MD      . ondansetron (ZOFRAN) tablet 4-8 mg  4-8 mg Oral Q8H PRN Alvy Bimler, Kassius Battiste, MD       Or  . ondansetron (ZOFRAN-ODT) disintegrating tablet 4-8 mg  4-8 mg Oral Q8H PRN Alvy Bimler, Jannette Cotham, MD       Or  . ondansetron (ZOFRAN) injection 4 mg  4 mg Intravenous Q8H PRN  Oline Belk, MD       Or  . ondansetron (ZOFRAN) 8 mg in sodium chloride 0.9 % 50 mL IVPB  8 mg Intravenous Q8H PRN Alvy Bimler, Latanza Pfefferkorn, MD      . polyethylene  glycol (MIRALAX / GLYCOLAX) packet 17 g  17 g Oral Daily Alvy Bimler, Agustin Swatek, MD      . rivaroxaban (XARELTO) tablet 20 mg  20 mg Oral Daily Cordarrel Stiefel, MD      . senna (SENOKOT) tablet 8.6 mg  1 tablet Oral BID Alvy Bimler, Sandrina Heaton, MD        REVIEW OF SYSTEMS:   Constitutional: Denies fevers, chills or abnormal night sweats Eyes: Denies blurriness of vision, double vision or watery eyes Ears, nose, mouth, throat, and face: Denies mucositis or sore throat Respiratory: Denies cough, dyspnea or wheezes Cardiovascular: Denies palpitation, chest discomfort or lower extremity swelling Skin: Denies abnormal skin rashes Lymphatics: Denies new lymphadenopathy or easy bruising Neurological:Denies numbness, tingling or new weaknesses Behavioral/Psych: Mood is stable, no new changes  All other systems were reviewed with the patient and are negative.  PHYSICAL EXAMINATION: ECOG PERFORMANCE STATUS: 2 - Symptomatic, <50% confined to bed  Vitals:   03/21/17 1040  BP: (!) 148/77  Pulse: 81  Resp: 18  Temp: (!) 101.8 F (38.8 C)  SpO2: 97%   Filed Weights   03/21/17 1040  Weight: 270 lb (122.5 kg)    GENERAL:alert, in severe distress from uncontrolled pain. SKIN: skin color, texture, turgor are normal, no rashes or significant lesions EYES: normal, conjunctiva are pink and non-injected, sclera clear OROPHARYNX:no exudate, no erythema and lips, buccal mucosa, and tongue normal  NECK: supple, thyroid normal size, non-tender, without nodularity LYMPH:  no palpable lymphadenopathy in the cervical, axillary or inguinal LUNGS: clear to auscultation and percussion with normal breathing effort HEART: regular rate & rhythm and no murmurs and no lower extremity edema ABDOMEN:abdomen soft, non-tender and normal bowel sounds Musculoskeletal:no cyanosis of digits  and no clubbing  PSYCH: alert & oriented x 3 with fluent speech NEURO: no focal motor/sensory deficits  LABORATORY DATA:  I have reviewed the data as listed Lab Results  Component Value Date   WBC 12.0 (H) 03/09/2017   HGB 11.2 (L) 03/09/2017   HCT 33.9 (L) 03/09/2017   MCV 83.6 03/09/2017   PLT 362 03/09/2017   Recent Labs    02/28/17 0359  NA 132*  K 4.2  CL 98*  CO2 23  GLUCOSE 266*  BUN 16  CREATININE 0.76  CALCIUM 8.9  GFRNONAA >60  GFRAA >60  PROT 7.0  ALBUMIN 3.2*  AST 24  ALT 24  ALKPHOS 87  BILITOT 0.5    RADIOGRAPHIC STUDIES: I have personally reviewed the radiological images as listed and agreed with the findings in the report. Dg Chest 2 View  Result Date: 02/28/2017 CLINICAL DATA:  52 y/o  F; shortness of breath. EXAM: CHEST  2 VIEW COMPARISON:  02/18/2012 chest radiograph FINDINGS: Stable heart size and mediastinal contours are within normal limits. Minor atelectasis in lung bases. No consolidation. The visualized skeletal structures are unremarkable. IMPRESSION: Minor atelectasis in lung bases.  No consolidation. Electronically Signed   By: Kristine Garbe M.D.   On: 02/28/2017 04:39   Ct Chest W Contrast  Result Date: 03/03/2017 CLINICAL DATA:  Generalized intra-abdominal and pelvic swelling. Mass and lump. EXAM: CT CHEST, ABDOMEN, AND PELVIS WITH CONTRAST TECHNIQUE: Multidetector CT imaging of the chest, abdomen and pelvis was performed following the standard protocol during bolus administration of intravenous contrast. CONTRAST:  <See Chart> ISOVUE-300 IOPAMIDOL (ISOVUE-300) INJECTION 61% COMPARISON:  None. FINDINGS: CT CHEST FINDINGS Cardiovascular: Normal heart size.  No pericardial effusion. Mediastinum/Nodes: The trachea is patent and midline. Normal appearance of the esophagus.  No enlarged mediastinal or hilar lymph nodes. No axillary or supraclavicular adenopathy. Lungs/Pleura: No pleural effusions. Platelike atelectasis versus scar  identified within the right middle lobe, right lower lobe and lingula. No suspicious pulmonary nodules or masses. Musculoskeletal: Degenerative disc disease identified within the thoracic spine. CT ABDOMEN PELVIS FINDINGS Hepatobiliary: Diffuse hepatic steatosis. Peripherally enhancing lesion within the lateral segment of left lobe of liver measures 2.3 cm, image 56 of series 2. Anterior dome of liver lesion measures 1.8 cm, image 42 of series 2. There are at least scratch set multiple lesions within the right lobe of liver noted. The largest is in segment 7 measuring 10.3 by 5.2 x 5.9 cm, image 59 of series 2. The gallbladder appears normal. Pancreas: Unremarkable. No pancreatic ductal dilatation or surrounding inflammatory changes. Spleen: Normal in size without focal abnormality. Adrenals/Urinary Tract: The adrenal glands are normal. Small nonobstructing right renal calculi noted within the interpolar kidney. No mass or hydronephrosis. Urinary bladder appears normal. Stomach/Bowel: Stomach is within normal limits. Appendix appears normal. No evidence of bowel wall thickening, distention, or inflammatory changes. Vascular/Lymphatic: No significant vascular findings are present. No enlarged abdominal or pelvic lymph nodes. Reproductive: Uterus and bilateral adnexa are unremarkable. Other: Left posterior pelvic mass extending into the obturator foramen measures 7 by 4.9 cm, image 115 of series 2. No surrounding bone changes identified. Musculoskeletal: No aggressive lytic or sclerotic bone lesions. IMPRESSION: 1. Multifocal liver metastasis. 2. Left posterior pelvic mass extending into the obturator foramen measures up to 7 cm. Suspicious for malignancy. This should be easily amendable to percutaneous tissue sampling under image guidance. 3. No lytic bone lesions identified of myeloma. Electronically Signed   By: Kerby Moors M.D.   On: 03/03/2017 11:21   Ct Abdomen Pelvis W Contrast  Result Date:  03/03/2017 CLINICAL DATA:  Generalized intra-abdominal and pelvic swelling. Mass and lump. EXAM: CT CHEST, ABDOMEN, AND PELVIS WITH CONTRAST TECHNIQUE: Multidetector CT imaging of the chest, abdomen and pelvis was performed following the standard protocol during bolus administration of intravenous contrast. CONTRAST:  <See Chart> ISOVUE-300 IOPAMIDOL (ISOVUE-300) INJECTION 61% COMPARISON:  None. FINDINGS: CT CHEST FINDINGS Cardiovascular: Normal heart size.  No pericardial effusion. Mediastinum/Nodes: The trachea is patent and midline. Normal appearance of the esophagus. No enlarged mediastinal or hilar lymph nodes. No axillary or supraclavicular adenopathy. Lungs/Pleura: No pleural effusions. Platelike atelectasis versus scar identified within the right middle lobe, right lower lobe and lingula. No suspicious pulmonary nodules or masses. Musculoskeletal: Degenerative disc disease identified within the thoracic spine. CT ABDOMEN PELVIS FINDINGS Hepatobiliary: Diffuse hepatic steatosis. Peripherally enhancing lesion within the lateral segment of left lobe of liver measures 2.3 cm, image 56 of series 2. Anterior dome of liver lesion measures 1.8 cm, image 42 of series 2. There are at least scratch set multiple lesions within the right lobe of liver noted. The largest is in segment 7 measuring 10.3 by 5.2 x 5.9 cm, image 59 of series 2. The gallbladder appears normal. Pancreas: Unremarkable. No pancreatic ductal dilatation or surrounding inflammatory changes. Spleen: Normal in size without focal abnormality. Adrenals/Urinary Tract: The adrenal glands are normal. Small nonobstructing right renal calculi noted within the interpolar kidney. No mass or hydronephrosis. Urinary bladder appears normal. Stomach/Bowel: Stomach is within normal limits. Appendix appears normal. No evidence of bowel wall thickening, distention, or inflammatory changes. Vascular/Lymphatic: No significant vascular findings are present. No enlarged  abdominal or pelvic lymph nodes. Reproductive: Uterus and bilateral adnexa are unremarkable. Other: Left posterior pelvic mass extending into  the obturator foramen measures 7 by 4.9 cm, image 115 of series 2. No surrounding bone changes identified. Musculoskeletal: No aggressive lytic or sclerotic bone lesions. IMPRESSION: 1. Multifocal liver metastasis. 2. Left posterior pelvic mass extending into the obturator foramen measures up to 7 cm. Suspicious for malignancy. This should be easily amendable to percutaneous tissue sampling under image guidance. 3. No lytic bone lesions identified of myeloma. Electronically Signed   By: Kerby Moors M.D.   On: 03/03/2017 11:21   US Biopsy (liver)  Result Date: 03/09/2017 CLINICAL DATA:  Lytic presacral lesion. Multiple liver lesions suggesting metastatic disease. EXAM: ULTRASOUND GUIDED CORE BIOPSY OF LIVER LESION MEDICATIONS: Intravenous Fentanyl and Versed were administered as conscious sedation during continuous monitoring of the patient's level of consciousness and physiological / cardiorespiratory status by the radiology RN, with a total moderate sedation time of 13 minutes. PROCEDURE: The procedure, risks, benefits, and alternatives were explained to the patient. Questions regarding the procedure were encouraged and answered. The patient understands and consents to the procedure. Survey ultrasound of the liver was performed. A representative lesion corresponding to CT findings was localized. Skin entry site was determined and marked. The operative field was prepped with chlorhexidine in a sterile fashion, and a sterile drape was applied covering the operative field. A sterile gown and sterile gloves were used for the procedure. Local anesthesia was provided with 1% Lidocaine. Under real-time ultrasound guidance, a 17 gauge trocar needle was advanced to the margin of the lesion. Once needle tip position was confirmed, coaxial 18-gauge core biopsy samples were  obtained, submitted in formalin to surgical pathology. The guide needle was removed. Postprocedure scans show no hemorrhage or other apparent complication. The patient tolerated the procedure well. COMPLICATIONS: None. FINDINGS: Hypoechoic liver lesions corresponding to CT findings were localized. Representative core biopsy samples obtained as above. IMPRESSION: 1. Technically successful ultrasound-guided core liver lesion biopsy Electronically Signed   By: Lucrezia Europe M.D.   On: 03/09/2017 17:01    ASSESSMENT & PLAN:  Metastatic squamous cell carcinoma of unknown origin, with metastatic disease to the sacral region and liver We have brief discussion about plan of care. To treat her metastatic cancer, I plan to administer systemic chemotherapy with carboplatin and Taxol She is arrange for port placement this week I would cancel her chemo education class today I will consult pathologist to send her tissue to [facility] for further evaluation to determine the source of her cancer.  Severe, uncontrolled cancer pain I will initiate steroid treatment, keeping her morphine sulfate at 60 mg 3 times a day along with Dilaudid for breakthrough pain medicine I will also give her intravenous pain medicine I will consider starting her on patient-controlled analgesia if she needed multiple breakthrough pain medicine I will consult palliative care service for assistance in pain management I have consulted radiation oncologist for urgent evaluation for palliative radiation therapy  Opiate-induced constipation I will start her on regular dose  DVT prophylaxis She is currently on Xarelto We will hold Xarelto in anticipation for future port placement on Wednesday  CODE STATUS Full code  Goals of care discussion The primary goals of care is to get her pain well controlled in this hospitalization.  She has failed outpatient therapy  All questions were answered. The patient knows to call the clinic with  any problems, questions or concerns.   [name], MD 03/21/2017 11:06 AM

## 2017-03-21 NOTE — Consult Note (Addendum)
Radiation Oncology         [phone] ________________________________  Initial inpatient Consultation  Name: Cindy Faulkner MRN: [id number]  Date of Service: 03/21/2017 DOB: 1966-01-15  DP:OEUMPN, Noelle, PA-C  No ref. provider found   REFERRING PHYSICIAN: No ref. provider found  DIAGNOSIS: The primary encounter diagnosis was Sacral mass. Diagnoses of Cancer associated pain, Metastasis to liver [facility]), Cancer of unknown origin [facility]), and Other constipation were also pertinent to this visit.    ICD-10-CM   1. Sacral mass R22.2 ALPRAZolam (XANAX) tablet 0.25 mg    citalopram (CELEXA) tablet 20 mg    flecainide (TAMBOCOR) tablet 50 mg    furosemide (LASIX) tablet 40 mg    gabapentin (NEURONTIN) capsule 300 mg    HYDROmorphone (DILAUDID) tablet 4 mg    metoprolol tartrate (LOPRESSOR) tablet 50 mg    morphine (MS CONTIN) 12 hr tablet 60 mg    multivitamin with minerals tablet 1 tablet    rivaroxaban (XARELTO) tablet 20 mg    acetaminophen (TYLENOL) tablet 650 mg    ondansetron (ZOFRAN) tablet 4-8 mg    ondansetron (ZOFRAN-ODT) disintegrating tablet 4-8 mg    ondansetron (ZOFRAN) injection 4 mg    ondansetron (ZOFRAN) 8 mg in sodium chloride 0.9 % 50 mL IVPB    0.9 %  sodium chloride infusion    senna (SENOKOT) tablet 8.6 mg    DISCONTINUED: Morphine Sulfate (PF) SOLN 10 mg    DISCONTINUED: Morphine Sulfate (PF) SOLN 10 mg    DISCONTINUED: Morphine Sulfate (PF) SOLN 10 mg  2. Cancer associated pain G89.3 ALPRAZolam (XANAX) tablet 0.25 mg    citalopram (CELEXA) tablet 20 mg    flecainide (TAMBOCOR) tablet 50 mg    furosemide (LASIX) tablet 40 mg    gabapentin (NEURONTIN) capsule 300 mg    HYDROmorphone (DILAUDID) tablet 4 mg    metoprolol tartrate (LOPRESSOR) tablet 50 mg    morphine (MS CONTIN) 12 hr tablet 60 mg    multivitamin with minerals tablet 1 tablet    rivaroxaban (XARELTO) tablet 20 mg    acetaminophen (TYLENOL) tablet 650 mg    ondansetron (ZOFRAN) tablet 4-8 mg      ondansetron (ZOFRAN-ODT) disintegrating tablet 4-8 mg    ondansetron (ZOFRAN) injection 4 mg    ondansetron (ZOFRAN) 8 mg in sodium chloride 0.9 % 50 mL IVPB    0.9 %  sodium chloride infusion    senna (SENOKOT) tablet 8.6 mg    polyethylene glycol (MIRALAX / GLYCOLAX) packet 17 g    dexamethasone (DECADRON) tablet 4 mg    DISCONTINUED: Morphine Sulfate (PF) SOLN 10 mg    DISCONTINUED: Morphine Sulfate (PF) SOLN 10 mg    DISCONTINUED: Morphine Sulfate (PF) SOLN 10 mg  3. Metastasis to liver (HCC) C78.7 ALPRAZolam (XANAX) tablet 0.25 mg    citalopram (CELEXA) tablet 20 mg    flecainide (TAMBOCOR) tablet 50 mg    furosemide (LASIX) tablet 40 mg    gabapentin (NEURONTIN) capsule 300 mg    HYDROmorphone (DILAUDID) tablet 4 mg    metoprolol tartrate (LOPRESSOR) tablet 50 mg    morphine (MS CONTIN) 12 hr tablet 60 mg    multivitamin with minerals tablet 1 tablet    rivaroxaban (XARELTO) tablet 20 mg    acetaminophen (TYLENOL) tablet 650 mg    ondansetron (ZOFRAN) tablet 4-8 mg    ondansetron (ZOFRAN-ODT) disintegrating tablet 4-8 mg    ondansetron (ZOFRAN) injection 4 mg    ondansetron (ZOFRAN) 8 mg  in sodium chloride 0.9 % 50 mL IVPB    0.9 %  sodium chloride infusion    senna (SENOKOT) tablet 8.6 mg    polyethylene glycol (MIRALAX / GLYCOLAX) packet 17 g    DISCONTINUED: Morphine Sulfate (PF) SOLN 10 mg    DISCONTINUED: Morphine Sulfate (PF) SOLN 10 mg    DISCONTINUED: Morphine Sulfate (PF) SOLN 10 mg  4. Cancer of unknown origin ([place]) C80.1 ALPRAZolam (XANAX) tablet 0.25 mg    citalopram (CELEXA) tablet 20 mg    flecainide (TAMBOCOR) tablet 50 mg    furosemide (LASIX) tablet 40 mg    gabapentin (NEURONTIN) capsule 300 mg    HYDROmorphone (DILAUDID) tablet 4 mg    metoprolol tartrate (LOPRESSOR) tablet 50 mg    morphine (MS CONTIN) 12 hr tablet 60 mg    multivitamin with minerals tablet 1 tablet    rivaroxaban (XARELTO) tablet 20 mg    acetaminophen (TYLENOL) tablet 650 mg     ondansetron (ZOFRAN) tablet 4-8 mg    ondansetron (ZOFRAN-ODT) disintegrating tablet 4-8 mg    ondansetron (ZOFRAN) injection 4 mg    ondansetron (ZOFRAN) 8 mg in sodium chloride 0.9 % 50 mL IVPB    0.9 %  sodium chloride infusion    senna (SENOKOT) tablet 8.6 mg    polyethylene glycol (MIRALAX / GLYCOLAX) packet 17 g    DISCONTINUED: Morphine Sulfate (PF) SOLN 10 mg    DISCONTINUED: Morphine Sulfate (PF) SOLN 10 mg    DISCONTINUED: Morphine Sulfate (PF) SOLN 10 mg  5. Other constipation K59.09 polyethylene glycol (MIRALAX / GLYCOLAX) packet 17 g    HISTORY OF PRESENT ILLNESS: Cindy Faulkner is a 52 y.o. female seen at the request of Dr. Alvy Bimler for a newly diagnosed metastatic squamous cell carcinoma.  Patient presented initially after 12-week history of back and pelvic pain and was seen by chiropractic and then by neurosurgery.  An MRI identified a mass within the pelvis along the left obturator space infiltrating the bone, and a CT scan of the abdomen and pelvis on 03/03/2017 revealed multifocal liver metastases with the largest measuring 10.3 cm as well as a left posterior pelvic mass extending into the obturator foramen measuring 7 x 4.9 cm. She underwent CT chest that day as well  The remainder of the pelvic organs were grossly normal.  There was not a source revealing platelike atelectasis versus scar in the right middle lobe, right lower lobe, and lingula without pulmonary nodules or masses otherwise seen.  She did undergo a liver biopsy on 03/09/2017 that revealed invasive squamous cell carcinoma.She has undergone additional workup by GYN oncology, and ECC, cervical biopsy, and endometrial biopsy are all negative.  She was seen today for a work in visit due to progressive pain despite long-acting morphine and p.o. Dilaudid, she was admitted for pain control, and we are asked to see the patient to consider palliative radiotherapy to her pelvic mass.  PREVIOUS RADIATION THERAPY: No  PAST  MEDICAL HISTORY:  Past Medical History:  Diagnosis Date  . Atrial fibrillation ([place])    a. echo 4/07: EF 60%  . Atrial tachycardia ([place])    a. s/p EPS 4/09: no inducible SVT - med Tx continued  . Complication of anesthesia    age 37yo, woke during procedure with GA, paralysed   . Endometrial polyp   . Hyperlipidemia   . Hypertension   . PONV (postoperative nausea and vomiting)   . Rectocele    WITHOUT MENTION OF UTERINE  PROLAPSE  . Stroke ([place])   . SVD (spontaneous vaginal delivery)    x 1      PAST SURGICAL HISTORY: Past Surgical History:  Procedure Laterality Date  . COLONOSCOPY    . HYSTEROSCOPY    . LEEP    . Pylonidal cystect    . TUBAL LIGATION    . WISDOM TOOTH EXTRACTION      FAMILY HISTORY:  Family History  Problem Relation Age of Onset  . Hypertension Father   . Diabetes Father   . Stroke Father   . Hypertension Mother   . Stroke Mother   . Cancer Mother        cervical ca  . Cancer Sister 57       uterine ca  . Cancer Maternal Grandmother        lung ca    SOCIAL HISTORY:  Social History   Socioeconomic History  . Marital status: Married    Spouse name: Kasandra Knudsen  . Number of children: 1  . Years of education: Not on file  . Highest education level: Not on file  Social Needs  . Financial resource strain: Not on file  . Food insecurity - worry: Not on file  . Food insecurity - inability: Not on file  . Transportation needs - medical: Not on file  . Transportation needs - non-medical: Not on file  Occupational History  . Occupation: nurse  Tobacco Use  . Smoking status: Former Smoker    Packs/day: 0.50    Years: 10.00    Pack years: 5.00    Types: Cigarettes    Last attempt to quit: 02/22/1998    Years since quitting: 19.0  . Smokeless tobacco: Never Used  Substance and Sexual Activity  . Alcohol use: No  . Drug use: No  . Sexual activity: Not on file  Other Topics Concern  . Not on file  Social History Narrative  . Not on file     ALLERGIES: Patient has no known allergies.  MEDICATIONS:  Current Facility-Administered Medications  Medication Dose Route Frequency Provider Last Rate Last Dose  . 0.9 %  sodium chloride infusion   Intravenous Continuous Alvy Bimler, Ni, MD 50 mL/hr at 03/21/17 1119    . acetaminophen (TYLENOL) tablet 650 mg  650 mg Oral Q4H PRN [name], MD   650 mg at 03/21/17 1219  . ALPRAZolam (XANAX) tablet 0.25 mg  0.25 mg Oral TID PRN [name], MD      . ALPRAZolam Duanne Moron) tablet 0.5 mg  0.5 mg Oral QHS PRN Loistine Chance, MD      . citalopram (CELEXA) tablet 20 mg  20 mg Oral Daily Alvy Bimler, Ni, MD   20 mg at 03/21/17 1217  . dexamethasone (DECADRON) tablet 4 mg  4 mg Oral Q8H Gorsuch, Ni, MD      . flecainide (TAMBOCOR) tablet 50 mg  50 mg Oral Daily Gorsuch, Ni, MD      . furosemide (LASIX) tablet 40 mg  40 mg Oral Daily Gorsuch, Ni, MD      . gabapentin (NEURONTIN) capsule 300 mg  300 mg Oral TID Alvy Bimler, Ni, MD      . HYDROmorphone (DILAUDID) injection 1 mg  1 mg Intravenous Q1H PRN Loistine Chance, MD   1 mg at 03/21/17 1437  . HYDROmorphone (DILAUDID) tablet 4 mg  4 mg Oral Q2H PRN [name], MD   4 mg at 03/21/17 1218  . lidocaine (LIDODERM) 5 % 1 patch  1  patch Transdermal Q24H Loistine Chance, MD      . metoprolol tartrate (LOPRESSOR) tablet 50 mg  50 mg Oral Daily Alvy Bimler, Ni, MD   50 mg at 03/21/17 1217  . morphine (MS CONTIN) 12 hr tablet 60 mg  60 mg Oral Q8H Gorsuch, Ni, MD   60 mg at 03/21/17 1436  . multivitamin with minerals tablet 1 tablet  1 tablet Oral Daily [name], MD   1 tablet at 03/21/17 1217  . ondansetron (ZOFRAN) tablet 4-8 mg  4-8 mg Oral Q8H PRN Alvy Bimler, Ni, MD       Or  . ondansetron (ZOFRAN-ODT) disintegrating tablet 4-8 mg  4-8 mg Oral Q8H PRN Alvy Bimler, Ni, MD       Or  . ondansetron (ZOFRAN) injection 4 mg  4 mg Intravenous Q8H PRN Gorsuch, Ni, MD       Or  . ondansetron (ZOFRAN) 8 mg in sodium chloride 0.9 % 50 mL IVPB  8 mg Intravenous Q8H PRN Gorsuch, Ni, MD       . polyethylene glycol (MIRALAX / GLYCOLAX) packet 17 g  17 g Oral Daily Alvy Bimler, Ni, MD   17 g at 03/21/17 1217  . rivaroxaban (XARELTO) tablet 20 mg  20 mg Oral Q breakfast Alvy Bimler, Ni, MD   20 mg at 03/21/17 1218  . senna (SENOKOT) tablet 8.6 mg  1 tablet Oral BID Alvy Bimler, Ni, MD        REVIEW OF SYSTEMS:  On review of systems, the patient reports that she is doing well overall now that she's had pain medication. She denies any chest pain, shortness of breath, cough, fevers, chills, night sweats, unintended weight changes. She denies any bowel or bladder disturbances.  She has had several episodes of nausea without emesis.  She describes her pain is constant in the low left pelvis, and worsened by sitting or standing for long periods of time. She denies any new musculoskeletal or joint aches or pains. A complete review of systems is obtained and is otherwise negative.    PHYSICAL EXAM:  Wt Readings from Last 3 Encounters:  03/21/17 270 lb (122.5 kg)  03/21/17 270 lb 11.2 oz (122.8 kg)  03/18/17 270 lb (122.5 kg)   Temp Readings from Last 3 Encounters:  03/21/17 (!) 101.8 F (38.8 C) (Oral)  03/21/17 (!) 100.7 F (38.2 C) (Oral)  03/18/17 100.2 F (37.9 C)   BP Readings from Last 3 Encounters:  03/21/17 (!) 148/77  03/21/17 (!) 143/115  03/18/17 (!) 142/92   Pulse Readings from Last 3 Encounters:  03/21/17 81  03/21/17 95  03/18/17 93   Pain Assessment Pain Score: 4 /10  In general this is a well appearing Caucasian female in no acute distress. She is alert and oriented x4 and appropriate throughout the examination. HEENT reveals that the patient is normocephalic, atraumatic. EOMs are intact. PERRLA. Skin is intact without any evidence of gross lesions. Cardiopulmonary assessment is negative for acute distress and she exhibits normal effort.    KPS = 40  100 - Normal; no complaints; no evidence of disease. 90   - Able to carry on normal activity; minor signs or symptoms  of disease. 80   - Normal activity with effort; some signs or symptoms of disease. 61   - Cares for self; unable to carry on normal activity or to do active work. 60   - Requires occasional assistance, but is able to care for most of his personal needs. 50   -  Requires considerable assistance and frequent medical care. 27   - Disabled; requires special care and assistance. 72   - Severely disabled; hospital admission is indicated although death not imminent. 2   - Very sick; hospital admission necessary; active supportive treatment necessary. 10   - Moribund; fatal processes progressing rapidly. 0     - Dead  Karnofsky DA, Abelmann [place], Craver LS and Burchenal [facility] [phone]) The use of the nitrogen mustards in the palliative treatment of carcinoma: with particular reference to bronchogenic carcinoma Cancer 1 [id number]  LABORATORY DATA:  Lab Results  Component Value Date   WBC 12.0 (H) 03/09/2017   HGB 11.2 (L) 03/09/2017   HCT 33.9 (L) 03/09/2017   MCV 83.6 03/09/2017   PLT 362 03/09/2017   Lab Results  Component Value Date   NA 132 (L) 02/28/2017   K 4.2 02/28/2017   CL 98 (L) 02/28/2017   CO2 23 02/28/2017   Lab Results  Component Value Date   ALT 24 02/28/2017   AST 24 02/28/2017   ALKPHOS 87 02/28/2017   BILITOT 0.5 02/28/2017     RADIOGRAPHY: Dg Chest 2 View  Result Date: 02/28/2017 CLINICAL DATA:  52 y/o  F; shortness of breath. EXAM: CHEST  2 VIEW COMPARISON:  02/18/2012 chest radiograph FINDINGS: Stable heart size and mediastinal contours are within normal limits. Minor atelectasis in lung bases. No consolidation. The visualized skeletal structures are unremarkable. IMPRESSION: Minor atelectasis in lung bases.  No consolidation. Electronically Signed   By: Kristine Garbe M.D.   On: 02/28/2017 04:39   Ct Chest W Contrast  Result Date: 03/03/2017 CLINICAL DATA:  Generalized intra-abdominal and pelvic swelling. Mass and lump. EXAM: CT CHEST, ABDOMEN, AND PELVIS WITH  CONTRAST TECHNIQUE: Multidetector CT imaging of the chest, abdomen and pelvis was performed following the standard protocol during bolus administration of intravenous contrast. CONTRAST:  <See Chart> ISOVUE-300 IOPAMIDOL (ISOVUE-300) INJECTION 61% COMPARISON:  None. FINDINGS: CT CHEST FINDINGS Cardiovascular: Normal heart size.  No pericardial effusion. Mediastinum/Nodes: The trachea is patent and midline. Normal appearance of the esophagus. No enlarged mediastinal or hilar lymph nodes. No axillary or supraclavicular adenopathy. Lungs/Pleura: No pleural effusions. Platelike atelectasis versus scar identified within the right middle lobe, right lower lobe and lingula. No suspicious pulmonary nodules or masses. Musculoskeletal: Degenerative disc disease identified within the thoracic spine. CT ABDOMEN PELVIS FINDINGS Hepatobiliary: Diffuse hepatic steatosis. Peripherally enhancing lesion within the lateral segment of left lobe of liver measures 2.3 cm, image 56 of series 2. Anterior dome of liver lesion measures 1.8 cm, image 42 of series 2. There are at least scratch set multiple lesions within the right lobe of liver noted. The largest is in segment 7 measuring 10.3 by 5.2 x 5.9 cm, image 59 of series 2. The gallbladder appears normal. Pancreas: Unremarkable. No pancreatic ductal dilatation or surrounding inflammatory changes. Spleen: Normal in size without focal abnormality. Adrenals/Urinary Tract: The adrenal glands are normal. Small nonobstructing right renal calculi noted within the interpolar kidney. No mass or hydronephrosis. Urinary bladder appears normal. Stomach/Bowel: Stomach is within normal limits. Appendix appears normal. No evidence of bowel wall thickening, distention, or inflammatory changes. Vascular/Lymphatic: No significant vascular findings are present. No enlarged abdominal or pelvic lymph nodes. Reproductive: Uterus and bilateral adnexa are unremarkable. Other: Left posterior pelvic mass  extending into the obturator foramen measures 7 by 4.9 cm, image 115 of series 2. No surrounding bone changes identified. Musculoskeletal: No aggressive lytic or sclerotic bone lesions. IMPRESSION: 1. Multifocal liver  metastasis. 2. Left posterior pelvic mass extending into the obturator foramen measures up to 7 cm. Suspicious for malignancy. This should be easily amendable to percutaneous tissue sampling under image guidance. 3. No lytic bone lesions identified of myeloma. Electronically Signed   By: Kerby Moors M.D.   On: 03/03/2017 11:21   Ct Abdomen Pelvis W Contrast  Result Date: 03/03/2017 CLINICAL DATA:  Generalized intra-abdominal and pelvic swelling. Mass and lump. EXAM: CT CHEST, ABDOMEN, AND PELVIS WITH CONTRAST TECHNIQUE: Multidetector CT imaging of the chest, abdomen and pelvis was performed following the standard protocol during bolus administration of intravenous contrast. CONTRAST:  <See Chart> ISOVUE-300 IOPAMIDOL (ISOVUE-300) INJECTION 61% COMPARISON:  None. FINDINGS: CT CHEST FINDINGS Cardiovascular: Normal heart size.  No pericardial effusion. Mediastinum/Nodes: The trachea is patent and midline. Normal appearance of the esophagus. No enlarged mediastinal or hilar lymph nodes. No axillary or supraclavicular adenopathy. Lungs/Pleura: No pleural effusions. Platelike atelectasis versus scar identified within the right middle lobe, right lower lobe and lingula. No suspicious pulmonary nodules or masses. Musculoskeletal: Degenerative disc disease identified within the thoracic spine. CT ABDOMEN PELVIS FINDINGS Hepatobiliary: Diffuse hepatic steatosis. Peripherally enhancing lesion within the lateral segment of left lobe of liver measures 2.3 cm, image 56 of series 2. Anterior dome of liver lesion measures 1.8 cm, image 42 of series 2. There are at least scratch set multiple lesions within the right lobe of liver noted. The largest is in segment 7 measuring 10.3 by 5.2 x 5.9 cm, image 59 of  series 2. The gallbladder appears normal. Pancreas: Unremarkable. No pancreatic ductal dilatation or surrounding inflammatory changes. Spleen: Normal in size without focal abnormality. Adrenals/Urinary Tract: The adrenal glands are normal. Small nonobstructing right renal calculi noted within the interpolar kidney. No mass or hydronephrosis. Urinary bladder appears normal. Stomach/Bowel: Stomach is within normal limits. Appendix appears normal. No evidence of bowel wall thickening, distention, or inflammatory changes. Vascular/Lymphatic: No significant vascular findings are present. No enlarged abdominal or pelvic lymph nodes. Reproductive: Uterus and bilateral adnexa are unremarkable. Other: Left posterior pelvic mass extending into the obturator foramen measures 7 by 4.9 cm, image 115 of series 2. No surrounding bone changes identified. Musculoskeletal: No aggressive lytic or sclerotic bone lesions. IMPRESSION: 1. Multifocal liver metastasis. 2. Left posterior pelvic mass extending into the obturator foramen measures up to 7 cm. Suspicious for malignancy. This should be easily amendable to percutaneous tissue sampling under image guidance. 3. No lytic bone lesions identified of myeloma. Electronically Signed   By: Kerby Moors M.D.   On: 03/03/2017 11:21   US Biopsy (liver)  Result Date: 03/09/2017 CLINICAL DATA:  Lytic presacral lesion. Multiple liver lesions suggesting metastatic disease. EXAM: ULTRASOUND GUIDED CORE BIOPSY OF LIVER LESION MEDICATIONS: Intravenous Fentanyl and Versed were administered as conscious sedation during continuous monitoring of the patient's level of consciousness and physiological / cardiorespiratory status by the radiology RN, with a total moderate sedation time of 13 minutes. PROCEDURE: The procedure, risks, benefits, and alternatives were explained to the patient. Questions regarding the procedure were encouraged and answered. The patient understands and consents to the  procedure. Survey ultrasound of the liver was performed. A representative lesion corresponding to CT findings was localized. Skin entry site was determined and marked. The operative field was prepped with chlorhexidine in a sterile fashion, and a sterile drape was applied covering the operative field. A sterile gown and sterile gloves were used for the procedure. Local anesthesia was provided with 1% Lidocaine. Under real-time ultrasound guidance, a  35 gauge trocar needle was advanced to the margin of the lesion. Once needle tip position was confirmed, coaxial 18-gauge core biopsy samples were obtained, submitted in formalin to surgical pathology. The guide needle was removed. Postprocedure scans show no hemorrhage or other apparent complication. The patient tolerated the procedure well. COMPLICATIONS: None. FINDINGS: Hypoechoic liver lesions corresponding to CT findings were localized. Representative core biopsy samples obtained as above. IMPRESSION: 1. Technically successful ultrasound-guided core liver lesion biopsy Electronically Signed   By: Lucrezia Europe M.D.   On: 03/09/2017 17:01      IMPRESSION/PLAN: 1. 52 y.o. woman with squamous cell carcinoma of unknown primary.  Dr. Tammi Klippel has reviewed her imaging studies, and has recommended a course of palliative radiotherapy.  She is not experiencing continuous pain in the right upper quadrant and in fact is only had one episode of this.  We can consider treating the liver with palliative radiotherapy as well however will hold off on this site due to the infrequent nature of discomfort as well as wanting to avoid toxicity that could impact her ability to tolerate systemic therapy.  She is in agreement with this plan and we have discussed her 10 fractions to the left pelvis to total 30 Gy.  We will plan with simulation this afternoon.  Consent is obtained.  We anticipate beginning treatment on Wednesday this week.  She is also going to proceed with Taxol and  carboplatin once she is medically stabilized and pain under control.  In a visit lasting 70 minutes, greater than 50% of the time was spent face to face discussing her case and reviewing her films, and in floor time coordinating the patient's care.     Carola Rhine, [facility]   Page Me

## 2017-03-22 ENCOUNTER — Telehealth: Payer: Self-pay | Admitting: Hematology and Oncology

## 2017-03-22 DIAGNOSIS — Z515 Encounter for palliative care: Secondary | ICD-10-CM

## 2017-03-22 DIAGNOSIS — R410 Disorientation, unspecified: Secondary | ICD-10-CM

## 2017-03-22 DIAGNOSIS — G893 Neoplasm related pain (acute) (chronic): Principal | ICD-10-CM

## 2017-03-22 DIAGNOSIS — K5909 Other constipation: Secondary | ICD-10-CM

## 2017-03-22 LAB — CBC WITH DIFFERENTIAL/PLATELET
Basophils Absolute: 0 10*3/uL (ref 0.0–0.1)
Basophils Relative: 0 %
Eosinophils Absolute: 0 10*3/uL (ref 0.0–0.7)
Eosinophils Relative: 0 %
HCT: 29.4 % — ABNORMAL LOW (ref 36.0–46.0)
Hemoglobin: 9.8 g/dL — ABNORMAL LOW (ref 12.0–15.0)
Lymphocytes Relative: 19 %
Lymphs Abs: 2 10*3/uL (ref 0.7–4.0)
MCH: 27.7 pg (ref 26.0–34.0)
MCHC: 33.3 g/dL (ref 30.0–36.0)
MCV: 83.1 fL (ref 78.0–100.0)
Monocytes Absolute: 0.3 10*3/uL (ref 0.1–1.0)
Monocytes Relative: 3 %
Neutro Abs: 8 10*3/uL — ABNORMAL HIGH (ref 1.7–7.7)
Neutrophils Relative %: 78 %
Platelets: 548 10*3/uL — ABNORMAL HIGH (ref 150–400)
RBC: 3.54 MIL/uL — ABNORMAL LOW (ref 3.87–5.11)
RDW: 14.3 % (ref 11.5–15.5)
WBC: 10.3 10*3/uL (ref 4.0–10.5)

## 2017-03-22 LAB — COMPREHENSIVE METABOLIC PANEL WITH GFR
ALT: 25 U/L (ref 14–54)
AST: 30 U/L (ref 15–41)
Albumin: 2.7 g/dL — ABNORMAL LOW (ref 3.5–5.0)
Alkaline Phosphatase: 81 U/L (ref 38–126)
Anion gap: 8 (ref 5–15)
BUN: 11 mg/dL (ref 6–20)
CO2: 29 mmol/L (ref 22–32)
Calcium: 8.9 mg/dL (ref 8.9–10.3)
Chloride: 98 mmol/L — ABNORMAL LOW (ref 101–111)
Creatinine, Ser: 0.48 mg/dL (ref 0.44–1.00)
GFR calc Af Amer: >60 mL/min
GFR calc non Af Amer: >60 mL/min
Glucose, Bld: 251 mg/dL — ABNORMAL HIGH (ref 65–99)
Potassium: 4.1 mmol/L (ref 3.5–5.1)
Sodium: 135 mmol/L (ref 135–145)
Total Bilirubin: 0.2 mg/dL — ABNORMAL LOW (ref 0.3–1.2)
Total Protein: 7.1 g/dL (ref 6.5–8.1)

## 2017-03-22 LAB — HIV ANTIBODY (ROUTINE TESTING W REFLEX): HIV Screen 4th Generation wRfx: NONREACTIVE

## 2017-03-22 MED ORDER — SENNA 8.6 MG PO TABS
2.0000 | ORAL_TABLET | Freq: Two times a day (BID) | ORAL | Status: DC
  Administered 2017-03-22 – 2017-04-01 (×16): 17.2 mg via ORAL
  Filled 2017-03-22 (×19): qty 2

## 2017-03-22 MED ORDER — HYDROMORPHONE 1 MG/ML IV SOLN
Freq: Every day | INTRAVENOUS | Status: DC
  Administered 2017-03-23: 11.93 mg via INTRAVENOUS
  Administered 2017-03-23: 19.5 mg via INTRAVENOUS
  Filled 2017-03-22: qty 25

## 2017-03-22 MED ORDER — HYDROMORPHONE 1 MG/ML IV SOLN
Freq: Every day | INTRAVENOUS | Status: DC
  Administered 2017-03-22: 13:00:00 via INTRAVENOUS
  Filled 2017-03-22: qty 25

## 2017-03-22 MED ORDER — ENOXAPARIN SODIUM 40 MG/0.4ML ~~LOC~~ SOLN
40.0000 mg | SUBCUTANEOUS | Status: DC
  Administered 2017-03-23: 40 mg via SUBCUTANEOUS
  Filled 2017-03-22: qty 0.4

## 2017-03-22 MED ORDER — HYDROMORPHONE 1 MG/ML IV SOLN
Freq: Every day | INTRAVENOUS | Status: DC
  Administered 2017-03-22: 4.5 mg via INTRAVENOUS

## 2017-03-22 MED ORDER — ALPRAZOLAM 0.25 MG PO TABS
0.2500 mg | ORAL_TABLET | Freq: Every evening | ORAL | Status: DC | PRN
  Administered 2017-03-22: 0.25 mg via ORAL
  Filled 2017-03-22: qty 1

## 2017-03-22 MED ORDER — ENOXAPARIN SODIUM 40 MG/0.4ML ~~LOC~~ SOLN: 40.0000 mg | SUBCUTANEOUS | Status: DC

## 2017-03-22 MED ORDER — FENTANYL CITRATE (PF) 100 MCG/2ML IJ SOLN
50.0000 ug | INTRAMUSCULAR | Status: DC | PRN
  Administered 2017-03-22 – 2017-03-28 (×5): 50 ug via INTRAVENOUS
  Filled 2017-03-22 (×5): qty 2

## 2017-03-22 MED ORDER — DEXTROSE 5 % IV SOLN
3.0000 g | INTRAVENOUS | Status: AC
  Administered 2017-03-23: 3 g via INTRAVENOUS
  Filled 2017-03-22: qty 3

## 2017-03-22 NOTE — Progress Notes (Signed)
Referring Physician(s): Gorsuch,N  Supervising Physician: Daryll Brod  Patient Status:  [name]  Chief Complaint: Metastatic squamous cell cancer, unknown origin   Subjective: Pt familiar to IR service from recent liver lesion biopsy on 03/09/17. She has a hx of squamous cell cancer of unknown origin. Request now received from oncology for port a cath placement for chemotherapy. She was originally on our OP schedule for 2/1. She is just back from pelvic radiation therapy.   Past Medical History:  Diagnosis Date  . Atrial fibrillation ([place])    a. echo 4/07: EF 60%  . Atrial tachycardia ([place])    a. s/p EPS 4/09: no inducible SVT - med Tx continued  . Complication of anesthesia    age 35yo, woke during procedure with GA, paralysed   . Endometrial polyp   . Hyperlipidemia   . Hypertension   . PONV (postoperative nausea and vomiting)   . Rectocele    WITHOUT MENTION OF UTERINE PROLAPSE  . Stroke ([place])   . SVD (spontaneous vaginal delivery)    x 1   Past Surgical History:  Procedure Laterality Date  . COLONOSCOPY    . HYSTEROSCOPY    . LEEP    . Pylonidal cystect    . TUBAL LIGATION    . WISDOM TOOTH EXTRACTION       Allergies: Patient has no known allergies.  Medications: Prior to Admission medications   Medication Sig Start Date End Date Taking? Authorizing Provider  acetaminophen (TYLENOL) 500 MG tablet Take 1,000 mg by mouth every 6 (six) hours as needed for mild pain.   Yes [provider]  ALPRAZolam (XANAX) 0.25 MG tablet Take 0.25 mg by mouth 3 (three) times daily as needed for sleep.    Yes [provider]  atorvastatin (LIPITOR) 40 MG tablet Take 1 tablet (40 mg total) by mouth daily at 6 PM. 12/06/15  Yes Dhungel, Nishant, MD  Biotin 1000 MCG tablet Take 1,000 mcg by mouth daily.   Yes [provider]  cholecalciferol (VITAMIN D) 1000 units tablet Take 2,000 Units by mouth daily.   Yes [provider]    citalopram (CELEXA) 20 MG tablet Take 20 mg by mouth daily.   Yes [provider]  flecainide (TAMBOCOR) 50 MG tablet Take 1 tablet (50 mg total) by mouth daily. 09/14/16  Yes Evans Lance, MD  furosemide (LASIX) 40 MG tablet Take 40 mg by mouth daily.   Yes [provider]  gabapentin (NEURONTIN) 300 MG capsule Take 300 mg by mouth 3 (three) times daily. 02/28/17  Yes [provider]  Glucosamine-Chondroitin (GLUCOSAMINE CHONDR COMPLEX PO) Take 2 capsules by mouth daily.    Yes [provider]  HYDROmorphone (DILAUDID) 4 MG tablet Take 1 tablet (4 mg total) by mouth every 4 (four) hours as needed. 03/18/17  Yes Everitt Amber, MD  metoprolol tartrate (LOPRESSOR) 50 MG tablet Take 1 tablet (50 mg total) by mouth daily. 09/14/16  Yes Evans Lance, MD  morphine (MS CONTIN) 60 MG 12 hr tablet Take 1 tablet (60 mg total) by mouth every 8 (eight) hours. 03/18/17  Yes Harle Stanford., PA-C  Multiple Vitamin (MULTIVITAMIN) tablet Take 2 tablets by mouth daily.    Yes [provider]  Omega-3 Fatty Acids (FISH OIL) 1000 MG CAPS Take 2 capsules by mouth daily.     Yes [provider]  oxymetazoline (AFRIN) 0.05 % nasal spray Place 2 sprays into both nostrils 2 (  two) times daily as needed for congestion.   Yes [provider]  rivaroxaban (XARELTO) 20 MG TABS tablet Take 1 tablet (20 mg total) by mouth daily. 12/06/15  Yes Dhungel, Nishant, MD  Turmeric (CURCUMIN 95) 500 MG CAPS Take 3 capsules by mouth daily.    Yes [provider]  valACYclovir (VALTREX) 1000 MG tablet Take 1 tablet (1,000 mg total) by mouth 3 (three) times daily. 03/11/17  Yes [name], MD     Vital Signs: BP 135/75 (BP Location: Right Arm)   Pulse (!) 57   Temp 97.8 F (36.6 C) (Oral)   Resp 13   Ht 5\' 7"  (1.702 m)   Wt 270 lb 1 oz (122.5 kg)   LMP 02/12/2012   SpO2 97%   BMI 42.30 kg/m   Physical Exam awake/alert; chest- CTA bilat ; heart- RRR; abd-  soft,+BS, some pelvic tenderness noted; no LE edema  Imaging: No results found.  Labs:  CBC: Recent Labs    02/28/17 0359 03/09/17 1034 03/22/17 0406  WBC 13.1* 12.0* 10.3  HGB 12.2 11.2* 9.8*  HCT 37.8 33.9* 29.4*  PLT 364 362 548*    COAGS: Recent Labs    03/09/17 1034  INR 1.08  APTT 32    BMP: Recent Labs    02/28/17 0359 03/22/17 0406  NA 132* 135  K 4.2 4.1  CL 98* 98*  CO2 23 29  GLUCOSE 266* 251*  BUN 16 11  CALCIUM 8.9 8.9  CREATININE 0.76 0.48  GFRNONAA >60 >60  GFRAA >60 >60    LIVER FUNCTION TESTS: Recent Labs    02/28/17 0359 03/22/17 0406  BILITOT 0.5 0.2*  AST 24 30  ALT 24 25  ALKPHOS 87 81  PROT 7.0 7.1  ALBUMIN 3.2* 2.7*    Assessment and Plan: Pt with hx of squamous cell cancer of unknown origin. Request now received from oncology for port a cath placement for chemotherapy. Risks and benefits discussed with the patient/family including, but not limited to bleeding, infection, pneumothorax, or fibrin sheath development and need for additional procedures.All of the patient's questions were answered, patient is agreeable to proceed.Consent signed and in chart. Procedure tent scheduled for 1/30. Will hold lovenox dose until after port placed.      Electronically Signed: D. Rowe Robert, PA-C 03/22/2017, 2:33 PM   I spent a total of 25 minutes at the the patient's bedside AND on the patient's hospital floor or unit, greater than 50% of which was counseling/coordinating care for port a cath placement    Patient ID: Cindy Faulkner, female   DOB: Oct 26, 1965, 52 y.o.   MRN: [id number]

## 2017-03-22 NOTE — Progress Notes (Signed)
   03/22/17 1200  Clinical Encounter Type  Visited With Patient and family together  Visit Type Follow-up;Psychological support;Spiritual support  Referral From Palliative care team  Consult/Referral To Chaplain  Spiritual Encounters  Spiritual Needs Brochure;Emotional;Other (Comment) (Spiritual Care conversation/Support/ Advance Directive )  Stress Factors  Patient Stress Factors Health changes;Major life changes  Family Stress Factors Health changes;Major life changes  Advance Directives (For Healthcare)  Does Patient Have a Medical Advance Directive? Yes  Does patient want to make changes to medical advance directive? Yes (Inpatient - patient requests chaplain consult to change a medical advance directive)  Type of Advance Directive [place];Living will  Copy of [place] in Chart? Yes  Copy of Living Will in Chart? Yes   I was contacted by the Palliative Care doctor stating that the patient had requested to complete an Advance directive.  I went over the paperwork with the patient and the document was completed. The patient named her husband and her mother as her Healthcare Agents. A copy was placed in her chart, and the original was given back to the patient.  The patient understands her condition and wants to be prepared for the future. She made her wishes clear to her family and completed a Living Will.    Please, contact Spiritual Care for further assistance.   Chaplain Shanon Ace M.Div., [facility]

## 2017-03-22 NOTE — Telephone Encounter (Signed)
Return for no new orders per 1/28 los.  °

## 2017-03-22 NOTE — Progress Notes (Signed)
   03/22/17 1000  Clinical Encounter Type  Visited With Patient and family together  Visit Type Follow-up;Psychological support  Referral From Patient  Consult/Referral To Chaplain  Spiritual Encounters  Spiritual Needs Emotional;Other (Comment) (Spiritual Care conversation/Support)  Stress Factors  Patient Stress Factors None identified  Family Stress Factors Health changes;Major life changes   I followed up with Cindy Faulkner due to previous support given since her illness. The patient was on the phone, but stated that it was ok to bring in the prayer shawl and neck pillow that I brought her.  The patient's mother was tearful, but hopeful that God is going to heal Cindy Faulkner. They are very spiritual people and are gaining their hope from their spirituality.  Please, contact Spiritual Care for further assistance.  Chaplain Shanon Ace M.Div., [facility]

## 2017-03-22 NOTE — Progress Notes (Signed)
Daily Progress Note   Patient Name: Cindy Faulkner       Date: 03/22/2017 DOB: 11/15/1965  Age: 52 y.o. MRN#: [id number] Attending Physician: [name], MD Primary Care Physician: Lennie Odor, PA-C Admit Date: 03/21/2017  Reason for Consultation/Follow-up: Non pain symptom management and Pain control  Subjective: I met today with [name], her mother, and her husband Kasandra Knudsen) and her mother Lelan Pons).  We discussed her clinical course over the past several months and the continued pain she has had in her lower extremity.  She reports that pain is currently 8 out of 10 in her lower back predominantly on the left side and radiating down her lower back into her sacrum and left leg.  She does have partial relief since starting PCA.  Her pain up until a little while ago has been 5-6 out of 10 but did get a little bit worse this morning.  She reports plan to start radiation therapy later today.  We also discussed the period of confusion that she had last night.  We reviewed her medication list in detail.  Length of Stay: 1  Current Medications: Scheduled Meds:  . citalopram  20 mg Oral Daily  . dexamethasone  4 mg Oral Q8H  . enoxaparin (LOVENOX) injection  40 mg Subcutaneous Q24H  . flecainide  50 mg Oral Daily  . furosemide  40 mg Oral Daily  . gabapentin  300 mg Oral TID  . [START ON 03/23/2017] HYDROmorphone   Intravenous Daily  . lidocaine  1 patch Transdermal Q24H  . morphine  60 mg Oral Q8H  . multivitamin with minerals  1 tablet Oral Daily  . polyethylene glycol  17 g Oral Daily  . senna  2 tablet Oral BID    Continuous Infusions: . sodium chloride 50 mL/hr at 03/21/17 1119  . ondansetron (ZOFRAN) IV      PRN Meds: acetaminophen, ALPRAZolam, ALPRAZolam, diphenhydrAMINE **OR**  diphenhydrAMINE, HYDROmorphone (DILAUDID) injection, naloxone **AND** sodium chloride flush, ondansetron **OR** ondansetron **OR** ondansetron (ZOFRAN) IV **OR** ondansetron (ZOFRAN) IV  Physical Exam         General: Alert, awake, in no acute distress.  HEENT: No bruits, no goiter, no JVD Heart: Regular rate and rhythm. No murmur appreciated. Lungs: Good air movement, clear Abdomen: Soft, nontender, nondistended, positive bowel sounds.  Ext: No significant edema Skin: Warm and dry Neuro: Grossly intact, nonfocal.   Vital Signs: BP 125/65 (BP Location: Right Arm)   Pulse (!) 58   Temp 97.8 F (36.6 C) (Oral)   Resp 10   Ht _0  (1.702 m)   Wt 122.5 kg (270 lb 1 oz)   LMP 02/12/2012   SpO2 94%   BMI 42.30 kg/m  SpO2: SpO2: 94 % O2 Device: O2 Device: Nasal Cannula O2 Flow Rate: O2 Flow Rate (L/min): 2 L/min  Intake/output summary:   Intake/Output Summary (Last 24 hours) at 03/22/2017 1124 Last data filed at 03/22/2017 0857 Gross per 24 hour  Intake 1006.67 ml  Output 250 ml  Net 756.67 ml   LBM: Last BM Date: 03/21/17 Baseline Weight: Weight: 122.5 kg (270 lb) Most recent weight: Weight: 122.5 kg (270 lb 1 oz)       Palliative Assessment/Data:    Flowsheet Rows     Most Recent Value  Intake Tab  Referral Department  Hospitalist  Unit at Time of Referral  Oncology Unit  Palliative Care Primary Diagnosis  Cancer  Date Notified  03/21/17  Palliative Care Type  New Palliative care  Reason for referral  Pain  Date of Admission  03/21/17  Date first seen by Palliative Care  03/21/17  # of days Palliative referral response time  0 Day(s)  # of days IP prior to Palliative referral  0  Clinical Assessment  Palliative Performance Scale Score  40%  Pain Max last 24 hours  6  Pain Min Last 24 hours  4  Dyspnea Max Last 24 Hours  4  Dyspnea Min Last 24 hours  3  Nausea Max Last 24 Hours  6  Nausea Min Last 24 Hours  4  Psychosocial & Spiritual Assessment    Palliative Care Outcomes  Patient/Family meeting held?  Yes  Who was at the meeting?  patient, husband, daughter, niece, mother       Patient Active Problem List   Diagnosis Date Noted  . Cervical cancer ([place]) 03/21/2017  . Metastasis to liver of unknown origin ([place]) 03/18/2017  . Dysuria 03/11/2017  . Herpes zoster 03/11/2017  . Cancer of unknown origin ([place]) 03/11/2017  . Metastasis to liver ([place]) 03/03/2017  . Sacral mass 03/02/2017  . Cancer associated pain 03/02/2017  . Mild single current episode of major depressive disorder ([place]) 08/23/2016  . Cerebral thrombosis with cerebral infarction 12/05/2015  . Stroke (cerebrum) ([place]) 12/05/2015  . Obstructive sleep apnea 07/24/2015  . Morbid obesity ([place]) 03/22/2014  . Preoperative evaluation to rule out surgical contraindication 10/04/2011  . Chest pain, unspecified 06/24/2010  . Shortness of breath 06/24/2010  . Essential hypertension 10/20/2008  . RECTOCELE WITHOUT MENTION OF UTERINE PROLAPSE 10/20/2008  . ENDOMETRIAL POLYP 10/20/2008  . AF (paroxysmal atrial fibrillation) ([place]) 10/20/2008    Palliative Care Assessment & Plan   Patient Profile: 52 year old female with newly diagnosed metastatic squamous cell cancer  Assessment: Patient Active Problem List   Diagnosis Date Noted  . Cervical cancer ([place]) 03/21/2017  . Metastasis to liver of unknown origin ([place]) 03/18/2017  . Dysuria 03/11/2017  . Herpes zoster 03/11/2017  . Cancer of unknown origin ([place]) 03/11/2017  . Metastasis to liver ([place]) 03/03/2017  . Sacral mass 03/02/2017  . Cancer associated pain 03/02/2017  . Mild single current episode of major depressive disorder ([place]) 08/23/2016  . Cerebral thrombosis with cerebral infarction 12/05/2015  . Stroke (cerebrum) ([place]) 12/05/2015  . Obstructive sleep  apnea 07/24/2015  . Morbid obesity ([place]) 03/22/2014  . Preoperative evaluation to rule out surgical contraindication 10/04/2011  . Chest pain, unspecified  06/24/2010  . Shortness of breath 06/24/2010  . Essential hypertension 10/20/2008  . RECTOCELE WITHOUT MENTION OF UTERINE PROLAPSE 10/20/2008  . ENDOMETRIAL POLYP 10/20/2008  . AF (paroxysmal atrial fibrillation) ([place]) 10/20/2008    Recommendations/Plan:  Pain: She is currently on a Dilaudid PCA with 1.5 mg bolus dose with 15-minute lockout.  No basal rate.  She has used approximately 22 mg of Dilaudid in the last 24 hours and 9 mg in the last 12.  We discussed plan for slightly decreasing her PCA dose to 1 mg while maintaining 15-minute lockout as she has been averaging a little less than 51m per hour.  I called and checked on her later this evening and she was having increase in pain following radiation so we re-increase this back up to 1.5 mg with a 15-minute lockout.  She is also getting morphine 60 mg 3 times daily.  Constipation: Increase senna to 2 tabs twice daily.  Continue MiraLAX.  Confusion: Likely multifactorial.  We discussed possible etiologies including several medications that could be contributing.  We planned for trial of lower dose of scheduled PM Xanax to see if this is beneficial.  I also advised her to have her CPAP brought in tonight.  If continues to have confusion, would consider transitioning off gabapentin or may be rotating morphine to another medication.  She would like to fill out paperwork naming her husband is her sAmbulance person  I placed a consult to spiritual care to help facilitate this.  Goals of Care and Additional Recommendations:  Limitations on Scope of Treatment: Full Scope Treatment  Code Status:    Code Status Orders  (From admission, onward)        Start     Ordered   03/21/17 1029  Full code  Continuous     03/21/17 1031    Code Status History    Date Active Date Inactive Code Status Order ID Comments User Context   12/05/2015 23:46 12/06/2015 21:30 Full Code [id number] KRise Patience MD ED    Advance Directive  Documentation     Most Recent Value  Type of Advance Directive  [facility]care [facility]  Pre-existing out of facility DNR order (yellow form or pink MOST form)  No data  "MOST" Form in Place?  No data       Prognosis:   Unable to determine  Discharge Planning:  To Be Determined  Care plan was discussed with Patient, husband, mother  Thank you for allowing the Palliative Medicine Team to assist in the care of this patient.   Total Time 40 Prolonged Time Billed No      Greater than 50%  of this time was spent counseling and coordinating care related to the above assessment and plan.  GMicheline Rough MD  Please contact Palliative Medicine Team phone at [phone] questions and concerns.

## 2017-03-22 NOTE — Progress Notes (Signed)
Inpatient Diabetes Program Recommendations  AACE/ADA: New Consensus Statement on Inpatient Glycemic Control (2015)  Target Ranges:  Prepandial:   less than 140 mg/dL      Peak postprandial:   less than 180 mg/dL (1-2 hours)      Critically ill patients:  140 - 180 mg/dL   Lab Results  Component Value Date   GLUCAP 105 (H) 12/05/2015   HGBA1C 5.8 (H) 12/06/2015    Review of Glycemic Control  Diabetes history: None Outpatient Diabetes medications: None Current orders for Inpatient glycemic control: None  FBS this am - 251 On Decadron at present. Has + family hx DM. Per HgbA1C - 5.8% is pre-diabetes.   Inpatient Diabetes Program Recommendations:     Check CBGs with meals. If > 180 mg/dL, add Novolog 0-9 units tidwc.  Will follow while inpatient.  Thank you. Lorenda Peck, RD, LDN, CDE Inpatient Diabetes Coordinator [phone]

## 2017-03-22 NOTE — Care Management Note (Signed)
Case Management Note  Patient Details  Name: Cindy Faulkner MRN: [id number] Date of Birth: 1965-11-01  Subjective/Objective:    52 yo admitted with Mets to liver for pain control.                Action/Plan: From home with spouse.  Expected Discharge Date:                  Expected Discharge Plan:  Home/Self Care  In-House Referral:     Discharge planning Services  CM Consult  Post Acute Care Choice:    Choice offered to:     DME Arranged:    DME Agency:     HH Arranged:    HH Agency:     Status of Service:  In process, will continue to follow  If discussed at Long Length of Stay Meetings, dates discussed:    Additional CommentsLynnell Catalan, RN 03/22/2017, 11:46 AM  [phone]

## 2017-03-22 NOTE — Progress Notes (Signed)
Cindy Faulkner [facility]   DOB:11-30-1965   EN#:[id number]    Subjective: The patient experienced a brief episode of hallucination/confusion last night.  Overall, she slept well.  Noted occasional bouts of bradycardia.  Pain control is stable.  She denies difficulty with urination  Objective:  Vitals:   03/21/17 2310 03/22/17 0503  BP:  125/65  Pulse:  (!) 58  Resp: (!) 9 10  Temp:  97.8 F (36.6 C)  SpO2: 95% 94%     Intake/Output Summary (Last 24 hours) at 03/22/2017 0932 Last data filed at 03/22/2017 0857 Gross per 24 hour  Intake 1006.67 ml  Output 250 ml  Net 756.67 ml    GENERAL:alert, no distress and comfortable SKIN: skin color, texture, turgor are normal, no rashes or significant lesions Musculoskeletal:no cyanosis of digits and no clubbing  NEURO: alert & oriented x 3 with fluent speech, no focal motor/sensory deficits   Labs:  Lab Results  Component Value Date   WBC 10.3 03/22/2017   HGB 9.8 (L) 03/22/2017   HCT 29.4 (L) 03/22/2017   MCV 83.1 03/22/2017   PLT 548 (H) 03/22/2017   NEUTROABS 8.0 (H) 03/22/2017    Lab Results  Component Value Date   NA 135 03/22/2017   K 4.1 03/22/2017   CL 98 (L) 03/22/2017   CO2 29 03/22/2017    Assessment & Plan:  Metastatic squamous cell carcinoma of unknown origin, with metastatic disease to the sacral region and liver We have brief discussion about plan of care. To treat her metastatic cancer, I plan to administer systemic chemotherapy with carboplatin and Taxol I will consult IR for port placement this week I will consult pathologist to send her tissue to [facility] for further evaluation to determine the source of her cancer.  Severe, uncontrolled cancer pain Appreciate palliative care consult Continue PCA without continuous infusion  Hopefully, she can start radiation soon  Opiate-induced constipation I will start her on regular dose  DVT prophylaxis I will hold Xarelto Switch her to subcu  Lovenox  Intermittent confusion Likely induced by medication We discussed the importance of good sleep hygiene  CODE STATUS Full code  Goals of care discussion The primary goals of care is to get her pain well controlled in this hospitalization.  She has failed outpatient therapy  Discharge planning Unlikely the next 2 days until she is off PCA and pain is reasonably controlled without side effects  [name], MD 03/22/2017  9:32 AM

## 2017-03-23 ENCOUNTER — Inpatient Hospital Stay (HOSPITAL_COMMUNITY): Payer: 59

## 2017-03-23 ENCOUNTER — Encounter (HOSPITAL_COMMUNITY): Payer: Self-pay | Admitting: Interventional Radiology

## 2017-03-23 ENCOUNTER — Ambulatory Visit
Admit: 2017-03-23 | Discharge: 2017-03-23 | Disposition: A | Discharge status: Home / Self Care | Payer: 59 | Attending: Radiation Oncology | Admitting: Radiation Oncology

## 2017-03-23 DIAGNOSIS — G4700 Insomnia, unspecified: Secondary | ICD-10-CM

## 2017-03-23 DIAGNOSIS — C539 Malignant neoplasm of cervix uteri, unspecified: Secondary | ICD-10-CM

## 2017-03-23 HISTORY — PX: IR FLUORO GUIDE PORT INSERTION RIGHT: IMG5741

## 2017-03-23 HISTORY — PX: IR US GUIDE VASC ACCESS RIGHT: IMG2390

## 2017-03-23 LAB — CBC WITH DIFFERENTIAL/PLATELET
BASOS PCT: 0 %
Basophils Absolute: 0 10*3/uL (ref 0.0–0.1)
EOS ABS: 0 10*3/uL (ref 0.0–0.7)
EOS PCT: 0 %
HCT: 30.4 % — ABNORMAL LOW (ref 36.0–46.0)
HEMOGLOBIN: 9.7 g/dL — AB (ref 12.0–15.0)
Lymphocytes Relative: 12 %
Lymphs Abs: 2 10*3/uL (ref 0.7–4.0)
MCH: 26.9 pg (ref 26.0–34.0)
MCHC: 31.9 g/dL (ref 30.0–36.0)
MCV: 84.2 fL (ref 78.0–100.0)
Monocytes Absolute: 1 10*3/uL (ref 0.1–1.0)
Monocytes Relative: 6 %
NEUTROS PCT: 82 %
Neutro Abs: 13.9 10*3/uL — ABNORMAL HIGH (ref 1.7–7.7)
PLATELETS: 650 10*3/uL — AB (ref 150–400)
RBC: 3.61 MIL/uL — ABNORMAL LOW (ref 3.87–5.11)
RDW: 14.4 % (ref 11.5–15.5)
WBC: 17 10*3/uL — ABNORMAL HIGH (ref 4.0–10.5)

## 2017-03-23 MED ORDER — GADOBENATE DIMEGLUMINE 529 MG/ML IV SOLN
20.0000 mL | Freq: Once | INTRAVENOUS | Status: AC | PRN
  Administered 2017-03-23: 20 mL via INTRAVENOUS

## 2017-03-23 MED ORDER — HEPARIN SOD (PORK) LOCK FLUSH 100 UNIT/ML IV SOLN
INTRAVENOUS | Status: AC
  Filled 2017-03-23: qty 5

## 2017-03-23 MED ORDER — MIDAZOLAM HCL 2 MG/2ML IJ SOLN
INTRAMUSCULAR | Status: AC | PRN
  Administered 2017-03-23: 1 mg via INTRAVENOUS
  Administered 2017-03-23: 2 mg via INTRAVENOUS
  Administered 2017-03-23 (×2): 1 mg via INTRAVENOUS

## 2017-03-23 MED ORDER — LIDOCAINE-EPINEPHRINE (PF) 2 %-1:200000 IJ SOLN
INTRAMUSCULAR | Status: AC
  Filled 2017-03-23: qty 20

## 2017-03-23 MED ORDER — FENTANYL CITRATE (PF) 100 MCG/2ML IJ SOLN
INTRAMUSCULAR | Status: AC | PRN
  Administered 2017-03-23 (×2): 50 ug via INTRAVENOUS

## 2017-03-23 MED ORDER — FENTANYL CITRATE (PF) 100 MCG/2ML IJ SOLN
INTRAMUSCULAR | Status: AC
  Filled 2017-03-23: qty 4

## 2017-03-23 MED ORDER — ONDANSETRON HCL 4 MG/2ML IJ SOLN
INTRAMUSCULAR | Status: AC | PRN
  Administered 2017-03-23: 4 mg via INTRAVENOUS

## 2017-03-23 MED ORDER — LIDOCAINE-EPINEPHRINE (PF) 1 %-1:200000 IJ SOLN
INTRAMUSCULAR | Status: AC | PRN
  Administered 2017-03-23: 20 mL

## 2017-03-23 MED ORDER — LIDOCAINE HCL 1 % IJ SOLN
INTRAMUSCULAR | Status: AC | PRN
  Administered 2017-03-23: 10 mL

## 2017-03-23 MED ORDER — HYDROMORPHONE 1 MG/ML IV SOLN
Freq: Every day | INTRAVENOUS | Status: AC
  Administered 2017-03-23: 23:00:00 via INTRAVENOUS
  Administered 2017-03-24: 7.08 mg via INTRAVENOUS
  Administered 2017-03-24: 11:00:00 via INTRAVENOUS
  Administered 2017-03-24: 14.45 mg via INTRAVENOUS
  Filled 2017-03-23 (×4): qty 25

## 2017-03-23 MED ORDER — OLANZAPINE 5 MG PO TABS
5.0000 mg | ORAL_TABLET | Freq: Every day | ORAL | Status: DC
  Administered 2017-03-23 – 2017-03-31 (×9): 5 mg via ORAL
  Filled 2017-03-23 (×9): qty 1

## 2017-03-23 MED ORDER — MIDAZOLAM HCL 2 MG/2ML IJ SOLN
INTRAMUSCULAR | Status: AC
  Filled 2017-03-23: qty 6

## 2017-03-23 MED ORDER — LIDOCAINE HCL 1 % IJ SOLN
INTRAMUSCULAR | Status: AC
  Filled 2017-03-23: qty 20

## 2017-03-23 MED ORDER — ONDANSETRON HCL 4 MG/2ML IJ SOLN
INTRAMUSCULAR | Status: AC
  Filled 2017-03-23: qty 2

## 2017-03-23 NOTE — Procedures (Signed)
Met sq cell ca  S/p RT IJ POWER PORT  Tip svcra No comp Stable EBL 0 Ready for use

## 2017-03-23 NOTE — Progress Notes (Signed)
Initial Nutrition Assessment  DOCUMENTATION CODES:   Morbid obesity  INTERVENTION:   Diet advancement per MD Will monitor for nutritional needs  NUTRITION DIAGNOSIS:   Increased nutrient needs related to cancer and cancer related treatments as evidenced by estimated needs.  GOAL:   Patient will meet greater than or equal to 90% of their needs  MONITOR:   Diet advancement, Weight trends, Labs, I & O's  REASON FOR ASSESSMENT:   Malnutrition Screening Tool    ASSESSMENT:    52 y.o. female is admitted for management of severe, uncontrolled cancer pain. Pt with cervical cancer.   Pt currently NPO. Yesterday pt was on a regular diet and consuming 100% of most meals. Pt's main complaint is pain. Pt to have port placed for chemotherapy.  Per chart review, pt has lost 18 lb since July 2018 (6% wt loss x 6 months, insignificant for time frame). Will continue to monitor for nutrition needs as pt begins cancer treatments.  Medications: Decadron tablet every 8 hours, Lasix tablet daily,  Multivitamin with minerals daily, IV Zofran PRN, Miralax packet daily, Senokot tablet BID,  Labs reviewed:   NUTRITION - FOCUSED PHYSICAL EXAM:  Nutrition focused physical exam shows no sign of depletion of muscle mass or body fat.  Diet Order:  Diet NPO time specified Except for: Sips with Meds  EDUCATION NEEDS:   Not appropriate for education at this time  Skin:  Skin Assessment: Reviewed RN Assessment  Last BM:  1/29  Height:   Ht Readings from Last 1 Encounters:  03/21/17 5\' 7"  (1.702 m)    Weight:   Wt Readings from Last 1 Encounters:  03/22/17 270 lb 1 oz (122.5 kg)    Ideal Body Weight:  61.4 kg  BMI:  Body mass index is 42.3 kg/m.  Estimated Nutritional Needs:   Kcal:  1941-7408  Protein:  85-95g  Fluid:  2.3L/day  Clayton Bibles, MS, RD, LDN [place] Dietitian Pager: [phone] After Hours Pager: [phone]

## 2017-03-23 NOTE — Progress Notes (Signed)
Daily Progress Note   Patient Name: Cindy Faulkner       Date: 03/23/2017 DOB: 10-11-1965  Age: 52 y.o. MRN#: [id number] Attending Physician: [name], MD Primary Care Physician: Lennie Odor, PA-C Admit Date: 03/21/2017  Reason for Consultation/Follow-up: Non pain symptom management and Pain control  Subjective: I met today with Cindy Faulkner, her husband Cindy Faulkner), and her daughter Cindy Faulkner).  We reviewed her pain and she reports that pain did improve yesterday following one-time dose of fentanyl and then continuation of PCA at initial setting of 1.5 mg with a 15-minute lockout.  She reports that her pain is fairly well-controlled today.  We discussed family is concerned about another period of significant confusion last night as well as her slight confusion the she experiences at times throughout the day.  We talked about how this is likely multifactorial in origin and we reviewed her medication list in detail.  Discussed overall goal is to continue to do her best to control her pain while working to get radiation completed to see if this allows [place] to work to titrate down her overall opioids.  We also discussed her poor sleep and how this may be related to being in the hospital, uncontrolled pain, and use of steroids (which remain necessary in order to better control her pain).  Family expressed better understanding of situation following our conversation.  Reviewed changes to care plan in detail.  See below.  Length of Stay: 2  Current Medications: Scheduled Meds:  . fentaNYL      . heparin lock flush      . lidocaine      . lidocaine-EPINEPHrine      . midazolam      . ondansetron      . citalopram  20 mg Oral Daily  . dexamethasone  4 mg Oral Q8H  . enoxaparin (LOVENOX) injection   40 mg Subcutaneous Q24H  . flecainide  50 mg Oral Daily  . furosemide  40 mg Oral Daily  . gabapentin  300 mg Oral TID  . [START ON 03/24/2017] HYDROmorphone   Intravenous Daily  . lidocaine  1 patch Transdermal Q24H  . multivitamin with minerals  1 tablet Oral Daily  . polyethylene glycol  17 g Oral Daily  . senna  2 tablet Oral BID  Continuous Infusions: . sodium chloride 50 mL/hr at 03/22/17 2327  .  ceFAZolin (ANCEF) IV    . ondansetron (ZOFRAN) IV      PRN Meds: acetaminophen, ALPRAZolam, diphenhydrAMINE **OR** diphenhydrAMINE, fentaNYL (SUBLIMAZE) injection, naloxone **AND** sodium chloride flush, ondansetron **OR** ondansetron **OR** ondansetron (ZOFRAN) IV **OR** ondansetron (ZOFRAN) IV  Physical Exam         General: Alert, awake, in no acute distress.  HEENT: No bruits, no goiter, no JVD Heart: Regular rate and rhythm. No murmur appreciated. Lungs: Good air movement, clear Abdomen: Soft, nontender, nondistended, positive bowel sounds.  Ext: No significant edema Skin: Warm and dry Neuro: Grossly intact, nonfocal.   Vital Signs: BP (!) 144/61 (BP Location: Right Arm)   Pulse 69   Temp 98.2 F (36.8 C) (Oral)   Resp 17   Ht '5\' 7"'  (1.702 m)   Wt 122.5 kg (270 lb 1 oz)   LMP 02/12/2012   SpO2 97%   BMI 42.30 kg/m  SpO2: SpO2: 97 % O2 Device: O2 Device: Nasal Cannula O2 Flow Rate: O2 Flow Rate (L/min): 2 L/min  Intake/output summary:   Intake/Output Summary (Last 24 hours) at 03/23/2017 1442 Last data filed at 03/23/2017 1019 Gross per 24 hour  Intake 1703.33 ml  Output -  Net 1703.33 ml   LBM: Last BM Date: 03/22/17 Baseline Weight: Weight: 122.5 kg (270 lb) Most recent weight: Weight: 122.5 kg (270 lb 1 oz)       Palliative Assessment/Data:    Flowsheet Rows     Most Recent Value  Intake Tab  Referral Department  Hospitalist  Unit at Time of Referral  Oncology Unit  Palliative Care Primary Diagnosis  Cancer  Date Notified  03/21/17    Palliative Care Type  New Palliative care  Reason for referral  Pain  Date of Admission  03/21/17  Date first seen by Palliative Care  03/21/17  # of days Palliative referral response time  0 Day(s)  # of days IP prior to Palliative referral  0  Clinical Assessment  Palliative Performance Scale Score  40%  Pain Max last 24 hours  6  Pain Min Last 24 hours  4  Dyspnea Max Last 24 Hours  4  Dyspnea Min Last 24 hours  3  Nausea Max Last 24 Hours  6  Nausea Min Last 24 Hours  4  Psychosocial & Spiritual Assessment  Palliative Care Outcomes  Patient/Family meeting held?  Yes  Who was at the meeting?  patient, husband, daughter, niece, mother       Patient Active Problem List   Diagnosis Date Noted  . Cervical cancer ([place]) 03/21/2017  . Metastasis to liver of unknown origin ([place]) 03/18/2017  . Dysuria 03/11/2017  . Herpes zoster 03/11/2017  . Cancer of unknown origin ([place]) 03/11/2017  . Metastasis to liver ([place]) 03/03/2017  . Sacral mass 03/02/2017  . Cancer associated pain 03/02/2017  . Mild single current episode of major depressive disorder ([place]) 08/23/2016  . Cerebral thrombosis with cerebral infarction 12/05/2015  . Stroke (cerebrum) ([place]) 12/05/2015  . Obstructive sleep apnea 07/24/2015  . Morbid obesity ([place]) 03/22/2014  . Preoperative evaluation to rule out surgical contraindication 10/04/2011  . Chest pain, unspecified 06/24/2010  . Shortness of breath 06/24/2010  . Essential hypertension 10/20/2008  . RECTOCELE WITHOUT MENTION OF UTERINE PROLAPSE 10/20/2008  . ENDOMETRIAL POLYP 10/20/2008  . AF (paroxysmal atrial fibrillation) ([place]) 10/20/2008    Palliative Care Assessment & Plan   Patient Profile: 52 year old  female with newly diagnosed metastatic squamous cell cancer  Assessment: Patient Active Problem List   Diagnosis Date Noted  . Cervical cancer ([place]) 03/21/2017  . Metastasis to liver of unknown origin ([place]) 03/18/2017  . Dysuria 03/11/2017  . Herpes  zoster 03/11/2017  . Cancer of unknown origin ([place]) 03/11/2017  . Metastasis to liver ([place]) 03/03/2017  . Sacral mass 03/02/2017  . Cancer associated pain 03/02/2017  . Mild single current episode of major depressive disorder ([place]) 08/23/2016  . Cerebral thrombosis with cerebral infarction 12/05/2015  . Stroke (cerebrum) ([place]) 12/05/2015  . Obstructive sleep apnea 07/24/2015  . Morbid obesity ([place]) 03/22/2014  . Preoperative evaluation to rule out surgical contraindication 10/04/2011  . Chest pain, unspecified 06/24/2010  . Shortness of breath 06/24/2010  . Essential hypertension 10/20/2008  . RECTOCELE WITHOUT MENTION OF UTERINE PROLAPSE 10/20/2008  . ENDOMETRIAL POLYP 10/20/2008  . AF (paroxysmal atrial fibrillation) ([place]) 10/20/2008    Recommendations/Plan:  Pain: Significant increase in her pain following radiation treatment yesterday.  I reviewed her PCA and she has used 49 mg of Dilaudid in the last 24 hours.  Additionally, she is still receiving MS Contin 60 mg three times daily.  Overall this converts to approximately 58 mg of IV Dilaudid in the last 24 hours.  She has been experiencing some confusion, and discussed rotation off of MS Contin to Dilaudid with utilization of basal rate on PCA.  I will plan for starting Dilaudid 1 mg basal rate while continuing current PCA setting of 1.5 mg with 15-minute lockout.  I am hopeful this will better control her pain while also minimizing the amount of opioid medications she is exposed to to see if this helps with confusion she has been having at night.  We did discuss again that overall goal is to get through radiation treatments with the hope that this will decrease her pain overall and allow her to work to titrate down her overall opioid needs.  I did leave an additional order for fentanyl 50 mcg as needed in case her pain becomes uncontrolled again following radiation.  Constipation: Continue senna 2 tabs twice daily.  Continue  MiraLAX.  Confusion: Likely multifactorial.  Dr. Alvy Bimler and I discussed possible etiologies including medications that could be contributing.  Also, she is currently on steroids to see if this is helpful with her pain.  This is potentially contributing to confusion as well as well as poor sleep she has been experiencing.  Discussed options with her and her family and will plan for discontinuation of scheduled evening Xanax in favor of Zyprexa 5 mg nightly.  We will also plan to simplify her opioid regimen to strictly Dilaudid as above.  Appreciate spiritual care facilitating completion of new advance directive paperwork.  Goals of Care and Additional Recommendations:  Limitations on Scope of Treatment: Full Scope Treatment  Code Status:    Code Status Orders  (From admission, onward)        Start     Ordered   03/21/17 1029  Full code  Continuous     03/21/17 1031    Code Status History    Date Active Date Inactive Code Status Order ID Comments User Context   12/05/2015 23:46 12/06/2015 21:30 Full Code [id number]  Rise Patience, MD ED    Advance Directive Documentation     Most Recent Value  Type of Advance Directive  Healthcare Power of Attorney, Living will  Pre-existing out of facility DNR order (yellow form or pink  MOST form)  No data  "MOST" Form in Place?  No data       Prognosis:   Unable to determine  Discharge Planning:  To Be Determined  Care plan was discussed with Patient, husband, daughter  Thank you for allowing the Palliative Medicine Team to assist in the care of this patient.   Total Time 40 Prolonged Time Billed No      Greater than 50%  of this time was spent counseling and coordinating care related to the above assessment and plan.  Micheline Rough, MD  Please contact Palliative Medicine Team phone at [phone] for questions and concerns.

## 2017-03-23 NOTE — Discharge Instructions (Signed)
Moderate Conscious Sedation, Adult, Care After °These instructions provide you with information about caring for yourself after your procedure. Your health care provider may also give you more specific instructions. Your treatment has been planned according to current medical practices, but problems sometimes occur. Call your health care provider if you have any problems or questions after your procedure. °What can I expect after the procedure? °After your procedure, it is common: °· To feel sleepy for several hours. °· To feel clumsy and have poor balance for several hours. °· To have poor judgment for several hours. °· To vomit if you eat too soon. ° °Follow these instructions at home: °For at least 24 hours after the procedure: ° °· Do not: °? Participate in activities where you could fall or become injured. °? Drive. °? Use heavy machinery. °? Drink alcohol. °? Take sleeping pills or medicines that cause drowsiness. °? Make important decisions or sign legal documents. °? Take care of children on your own. °· Rest. °Eating and drinking °· Follow the diet recommended by your health care provider. °· If you vomit: °? Drink water, juice, or soup when you can drink without vomiting. °? Make sure you have little or no nausea before eating solid foods. °General instructions °· Have a responsible adult stay with you until you are awake and alert. °· Take over-the-counter and prescription medicines only as told by your health care provider. °· If you smoke, do not smoke without supervision. °· Keep all follow-up visits as told by your health care provider. This is important. °Contact a health care provider if: °· You keep feeling nauseous or you keep vomiting. °· You feel light-headed. °· You develop a rash. °· You have a fever. °Get help right away if: °· You have trouble breathing. °This information is not intended to replace advice given to you by your health care provider. Make sure you discuss any questions you have  with your health care provider. °Document Released: 11/29/2012 Document Revised: 07/14/2015 Document Reviewed: 05/31/2015 °Elsevier Interactive Patient Education © 2018 [employer] °Implanted Port Home Guide °An implanted port is a type of central line that is placed under the skin. Central lines are used to provide IV access when treatment or nutrition needs to be given through a person’s veins. Implanted ports are used for long-term IV access. An implanted port may be placed because: °· You need IV medicine that would be irritating to the small veins in your hands or arms. °· You need long-term IV medicines, such as antibiotics. °· You need IV nutrition for a long period. °· You need frequent blood draws for lab tests. °· You need dialysis. ° °Implanted ports are usually placed in the chest area, but they can also be placed in the upper arm, the abdomen, or the leg. An implanted port has two main parts: °· Reservoir. The reservoir is round and will appear as a small, raised area under your skin. The reservoir is the part where a needle is inserted to give medicines or draw blood. °· Catheter. The catheter is a thin, flexible tube that extends from the reservoir. The catheter is placed into a large vein. Medicine that is inserted into the reservoir goes into the catheter and then into the vein. ° °How will I care for my incision site? °Do not get the incision site wet. Bathe or shower as directed by your health care provider. °How is my port accessed? °Special steps must be taken to access the port: °·   Before the port is accessed, a numbing cream can be placed on the skin. This helps numb the skin over the port site. °· Your health care provider uses a sterile technique to access the port. °? Your health care provider must put on a mask and sterile gloves. °? The skin over your port is cleaned carefully with an antiseptic and allowed to dry. °? The port is gently pinched between sterile gloves, and a needle is  inserted into the port. °· Only "non-coring" port needles should be used to access the port. Once the port is accessed, a blood return should be checked. This helps ensure that the port is in the vein and is not clogged. °· If your port needs to remain accessed for a constant infusion, a clear (transparent) bandage will be placed over the needle site. The bandage and needle will need to be changed every week, or as directed by your health care provider. °· Keep the bandage covering the needle clean and dry. Do not get it wet. Follow your health care provider’s instructions on how to take a shower or bath while the port is accessed. °· If your port does not need to stay accessed, no bandage is needed over the port. ° °What is flushing? °Flushing helps keep the port from getting clogged. Follow your health care provider’s instructions on how and when to flush the port. Ports are usually flushed with saline solution or a medicine called heparin. The need for flushing will depend on how the port is used. °· If the port is used for intermittent medicines or blood draws, the port will need to be flushed: °? After medicines have been given. °? After blood has been drawn. °? As part of routine maintenance. °· If a constant infusion is running, the port may not need to be flushed. ° °How long will my port stay implanted? °The port can stay in for as long as your health care provider thinks it is needed. When it is time for the port to come out, surgery will be done to remove it. The procedure is similar to the one performed when the port was put in. °When should I seek immediate medical care? °When you have an implanted port, you should seek immediate medical care if: °· You notice a bad smell coming from the incision site. °· You have swelling, redness, or drainage at the incision site. °· You have more swelling or pain at the port site or the surrounding area. °· You have a fever that is not controlled with  medicine. ° °This information is not intended to replace advice given to you by your health care provider. Make sure you discuss any questions you have with your health care provider. °Document Released: 02/08/2005 Document Revised: 07/17/2015 Document Reviewed: 10/16/2012 °Elsevier Interactive Patient Education © 2017 Elsevier Inc. ° °

## 2017-03-23 NOTE — Progress Notes (Signed)
Cindy Faulkner   DOB:06-20-1965   EL#:[id number]    Subjective: Multiple family members are present in the room.  Her daughter, husband and her mother were there.  According to family member, she was placed on CPAP machine last night.  It was noted that the patient received 1 dose of IV fentanyl with good pain relief last night. She woke up around 5 AM, again with confusion/hallucination.  The patient attempted to rip off her IV to leave the hospital but the family members were able to reorient her.    Her daughter voiced significant concerns about her medications,  "something needs to be done about this" because she is concerned that each bout of confusion/hallucination will cause brain damage, especially in view of prior history of stroke.  Her husband asked numerous questions in regards to other form of pain management such as injection or surgery to numb the sacral mass that caused pain.  The patient is noted to have started radiation therapy.  He is also asking about questions in regards to imaging of the brain to rule out brain metastasis.  The patient denies focal neurological deficits.  She denies difficulties with urination.  She has loose bowel movement with laxatives.  Her pain is reasonably controlled this morning.  Objective:  Vitals:   03/23/17 0244 03/23/17 0622  BP:  (!) 172/88  Pulse:  68  Resp: 12 13  Temp:  98 F (36.7 C)  SpO2: 99% 98%     Intake/Output Summary (Last 24 hours) at 03/23/2017 2585 Last data filed at 03/23/2017 2778 Gross per 24 hour  Intake 480 ml  Output -  Net 480 ml    GENERAL:alert, no distress and comfortable NEURO: alert & oriented x 3 with fluent speech, no focal motor/sensory deficits   Labs:  Lab Results  Component Value Date   WBC 17.0 (H) 03/23/2017   HGB 9.7 (L) 03/23/2017   HCT 30.4 (L) 03/23/2017   MCV 84.2 03/23/2017   PLT 650 (H) 03/23/2017   NEUTROABS 13.9 (H) 03/23/2017    Lab Results  Component Value Date   NA 135 03/22/2017    K 4.1 03/22/2017   CL 98 (L) 03/22/2017   CO2 29 03/22/2017    Assessment & Plan:   Metastatic squamous cell carcinoma of unknown origin, with metastatic disease to the sacral region and liver We have brief discussion about plan of care. To treat her metastatic cancer, I plan to administer systemic chemotherapy with carboplatin and Taxol. IR is consulted to place port, scheduled for today I have consulted pathologist to send her tissue to [facility] further evaluation to determine the source of her cancer.  Severe, uncontrolled cancer pain Continue PCA without continuous infusion  I attempted to address all her family's questions in regards to pain management. I really believe that she needs time to allow radiation to take effect to control her pain.  Appreciate palliative care assistance in pain management  Opiate-induced constipation, resolved Continue regular laxatives  DVT prophylaxis I will hold Xareltox, until after port placement  Intermittent confusion Likely induced by medication I will request MRI imaging of her brain to exclude brain metastasis  CODE STATUS Full code  Goals of care discussion The primary goals of care is to get her pain well controlled in this hospitalization.She has failed outpatient therapy  Discharge planning Unlikely the next 2 days until she is off PCA and pain is reasonably controlled without side effects  [name], MD 03/23/2017  8:22  AM

## 2017-03-23 NOTE — Progress Notes (Signed)
   03/23/17 1200  Clinical Encounter Type  Visited With Patient and family together  Visit Type Follow-up;Psychological support;Spiritual support  Referral From Patient  Consult/Referral To Chaplain  Spiritual Encounters  Spiritual Needs Emotional;Prayer;Other (Comment) (Spiritual Care conversation/Support)  Stress Factors  Patient Stress Factors Health changes;Major life changes;Other (Comment) (Lack of sleep)  Family Stress Factors Health changes;Major life changes;Other (Comment) (Lack of sleep)   I followed up with Cindy Faulkner per previous visit. Cindy Faulkner stated that she was tired today and that she hasn't been getting sleep. Cindy Faulkner states that her delirium during the nights bothers her. She is trying to cope with the feeling of loss of agency and control.  She requested prayer and I prayed at the bedside.   I will continue to follow up.   Please, contact Spiritual Care for further assistance.   Chaplain Shanon Ace M.Div., [facility]

## 2017-03-24 ENCOUNTER — Telehealth: Payer: Self-pay | Admitting: Hematology and Oncology

## 2017-03-24 ENCOUNTER — Ambulatory Visit: Payer: 59 | Admitting: Gynecologic Oncology

## 2017-03-24 ENCOUNTER — Other Ambulatory Visit: Payer: Self-pay | Admitting: Hematology and Oncology

## 2017-03-24 ENCOUNTER — Ambulatory Visit
Admit: 2017-03-24 | Discharge: 2017-03-24 | Disposition: A | Discharge status: Home / Self Care | Payer: 59 | Attending: Radiation Oncology | Admitting: Radiation Oncology

## 2017-03-24 DIAGNOSIS — R222 Localized swelling, mass and lump, trunk: Secondary | ICD-10-CM

## 2017-03-24 DIAGNOSIS — R531 Weakness: Secondary | ICD-10-CM

## 2017-03-24 DIAGNOSIS — R269 Unspecified abnormalities of gait and mobility: Secondary | ICD-10-CM

## 2017-03-24 MED ORDER — HYDROMORPHONE 1 MG/ML IV SOLN
Freq: Every day | INTRAVENOUS | Status: DC
  Administered 2017-03-24: 18:00:00 via INTRAVENOUS
  Administered 2017-03-25: 13.95 mg via INTRAVENOUS
  Administered 2017-03-25: 14:00:00 via INTRAVENOUS
  Administered 2017-03-25: 20.15 mg via INTRAVENOUS
  Administered 2017-03-25: 12.9 mg via INTRAVENOUS
  Administered 2017-03-25: 06:00:00 via INTRAVENOUS
  Administered 2017-03-26: 6.45 mg via INTRAVENOUS
  Administered 2017-03-26: 7.5 mg via INTRAVENOUS
  Administered 2017-03-26 (×3): via INTRAVENOUS
  Administered 2017-03-26: 8.57 mg via INTRAVENOUS
  Administered 2017-03-27: 3.69 mg via INTRAVENOUS
  Administered 2017-03-27: 12:00:00 via INTRAVENOUS
  Administered 2017-03-27: 14.5 mg via INTRAVENOUS
  Administered 2017-03-27: 02:00:00 via INTRAVENOUS
  Filled 2017-03-24 (×7): qty 25

## 2017-03-24 MED ORDER — SODIUM CHLORIDE 0.9% FLUSH: 10.0000 mL | INTRAVENOUS | Status: DC | PRN

## 2017-03-24 MED ORDER — RIVAROXABAN 20 MG PO TABS
20.0000 mg | ORAL_TABLET | Freq: Every day | ORAL | Status: DC
  Administered 2017-03-24 – 2017-04-01 (×9): 20 mg via ORAL
  Filled 2017-03-24 (×9): qty 1

## 2017-03-24 NOTE — Progress Notes (Signed)
[facility]   DOB:Mar 20, 1965   DD#:[id number]    Subjective: The patient continues to have daily confusion episode, typically in the morning when she wakes up around 5 AM.  The patient believes she was in the TV show.  Family member was able to reorient the patient.  The patient had MRI of the brain done last night and port placement yesterday.  Her prescription pain medicine was modified per discussion with palliative care service. Today, she rated her pain at around 6 out of 10, reasonably controlled.  She was able to walk but she felt that her gait has been unstable. She denies nausea or constipation.  Objective:  Vitals:   03/24/17 0515 03/24/17 0557  BP: (!) 171/80   Pulse: 82   Resp: 13 11  Temp: 97.7 F (36.5 C)   SpO2: 96% 98%     Intake/Output Summary (Last 24 hours) at 03/24/2017 0819 Last data filed at 03/23/2017 1800 Gross per 24 hour  Intake 1859.43 ml  Output -  Net 1859.43 ml    GENERAL:alert, no distress and comfortable SKIN: skin color, texture, turgor are normal, no rashes or significant lesions EYES: normal, Conjunctiva are pink and non-injected, sclera clear OROPHARYNX:no exudate, no erythema and lips, buccal mucosa, and tongue normal  NECK: supple, thyroid normal size, non-tender, without nodularity LYMPH:  no palpable lymphadenopathy in the cervical, axillary or inguinal LUNGS: clear to auscultation and percussion with normal breathing effort HEART: regular rate & rhythm and no murmurs and no lower extremity edema ABDOMEN:abdomen soft, non-tender and normal bowel sounds Musculoskeletal:no cyanosis of digits and no clubbing  NEURO: alert & oriented x 3 with fluent speech, no focal motor/sensory deficits   Labs:  Lab Results  Component Value Date   WBC 17.0 (H) 03/23/2017   HGB 9.7 (L) 03/23/2017   HCT 30.4 (L) 03/23/2017   MCV 84.2 03/23/2017   PLT 650 (H) 03/23/2017   NEUTROABS 13.9 (H) 03/23/2017    Lab Results  Component Value Date   NA 135  03/22/2017   K 4.1 03/22/2017   CL 98 (L) 03/22/2017   CO2 29 03/22/2017    Studies:  Mr Jeri Cos WC Contrast  Result Date: 03/23/2017 CLINICAL DATA:  52 y/o F; metastatic squamous cell carcinoma for staging. Patient with confusion. EXAM: MRI HEAD WITHOUT AND WITH CONTRAST TECHNIQUE: Multiplanar, multiecho pulse sequences of the brain and surrounding structures were obtained without and with intravenous contrast. CONTRAST:  28mL MULTIHANCE GADOBENATE DIMEGLUMINE 529 MG/ML IV SOLN COMPARISON:  12/05/2015 MRI head FINDINGS: Brain: Motion degradation on multiple sequences. No acute infarction, hemorrhage, hydrocephalus, extra-axial collection or mass lesion. Fewnonspecific foci of T2 FLAIR hyperintense signal abnormality in subcortical and periventricular white matter are compatible withmildchronic microvascular ischemic changes for age. Mildbrain parenchymal volume loss. Small chronic lacunar infarct within genu of left internal capsule. No abnormal enhancement of the brain. Vascular: Normal flow voids. Skull and upper cervical spine: Normal marrow signal. Sinuses/Orbits: Right maxillary sinus mucous retention cyst. Otherwise negative. Other: None. IMPRESSION: 1. No intracranial metastasis identified. 2. Mild chronic microvascular ischemic changes and mild parenchymal volume loss of the brain. 3. Small chronic lacunar infarct within genu of left internal capsule. Electronically Signed   By: Kristine Garbe M.D.   On: 03/23/2017 22:10   Ir US Guide Vasc Access Right  Result Date: 03/23/2017 CLINICAL DATA:  METASTATIC SQUAMOUS CELL CARCINOMA EXAM: RIGHT INTERNAL JUGULAR SINGLE LUMEN POWER PORT CATHETER INSERTION Date:  03/23/2017 03/23/2017 3:41 pm Radiologist:  Jerilynn Mages.  Daryll Brod, MD Guidance:  Ultrasound and fluoroscopic MEDICATIONS: Ancef 3 g; The antibiotic was administered within an appropriate time interval prior to skin puncture. ANESTHESIA/SEDATION: Versed 2.0 mg IV; Fentanyl 100 mcg IV; Moderate  Sedation Time:  35 minutes The patient was continuously monitored during the procedure by the interventional radiology nurse under my direct supervision. FLUOROSCOPY TIME:  One minutes, 12 seconds (22 mGy) COMPLICATIONS: None immediate. CONTRAST:  None. PROCEDURE: Informed consent was obtained from the patient following explanation of the procedure, risks, benefits and alternatives. The patient understands, agrees and consents for the procedure. All questions were addressed. A time out was performed. Maximal barrier sterile technique utilized including caps, mask, sterile gowns, sterile gloves, large sterile drape, hand hygiene, and 2% chlorhexidine scrub. Under sterile conditions and local anesthesia, right internal jugular micropuncture venous access was performed. Access was performed with ultrasound. Images were obtained for documentation. A guide wire was inserted followed by a transitional dilator. This allowed insertion of a guide wire and catheter into the IVC. Measurements were obtained from the SVC / RA junction back to the right IJ venotomy site. In the right infraclavicular chest, a subcutaneous pocket was created over the second anterior rib. This was done under sterile conditions and local anesthesia. 1% lidocaine with epinephrine was utilized for this. A 2.5 cm incision was made in the skin. Blunt dissection was performed to create a subcutaneous pocket over the right pectoralis major muscle. The pocket was flushed with saline vigorously. There was adequate hemostasis. The port catheter was assembled and checked for leakage. The port catheter was secured in the pocket with two retention sutures. The tubing was tunneled subcutaneously to the right venotomy site and inserted into the SVC/RA junction through a valved peel-away sheath. Position was confirmed with fluoroscopy. Images were obtained for documentation. The patient tolerated the procedure well. No immediate complications. Incisions were closed  in a two layer fashion with 4 - 0 Vicryl suture. Dermabond was applied to the skin. The port catheter was accessed, blood was aspirated followed by saline and heparin flushes. Needle was removed. A dry sterile dressing was applied. IMPRESSION: Ultrasound and fluoroscopically guided right internal jugular single lumen power port catheter insertion. Tip in the SVC/RA junction. Catheter ready for use. Electronically Signed   By: Jerilynn Mages.  Shick M.D.   On: 03/23/2017 15:48   Ir Fluoro Guide Port Insertion Right  Result Date: 03/23/2017 CLINICAL DATA:  METASTATIC SQUAMOUS CELL CARCINOMA EXAM: RIGHT INTERNAL JUGULAR SINGLE LUMEN POWER PORT CATHETER INSERTION Date:  03/23/2017 03/23/2017 3:41 pm Radiologist:  Jerilynn Mages. Daryll Brod, MD Guidance:  Ultrasound and fluoroscopic MEDICATIONS: Ancef 3 g; The antibiotic was administered within an appropriate time interval prior to skin puncture. ANESTHESIA/SEDATION: Versed 2.0 mg IV; Fentanyl 100 mcg IV; Moderate Sedation Time:  35 minutes The patient was continuously monitored during the procedure by the interventional radiology nurse under my direct supervision. FLUOROSCOPY TIME:  One minutes, 12 seconds (22 mGy) COMPLICATIONS: None immediate. CONTRAST:  None. PROCEDURE: Informed consent was obtained from the patient following explanation of the procedure, risks, benefits and alternatives. The patient understands, agrees and consents for the procedure. All questions were addressed. A time out was performed. Maximal barrier sterile technique utilized including caps, mask, sterile gowns, sterile gloves, large sterile drape, hand hygiene, and 2% chlorhexidine scrub. Under sterile conditions and local anesthesia, right internal jugular micropuncture venous access was performed. Access was performed with ultrasound. Images were obtained for documentation. A guide wire was inserted followed by a transitional dilator.  This allowed insertion of a guide wire and catheter into the IVC. Measurements  were obtained from the SVC / RA junction back to the right IJ venotomy site. In the right infraclavicular chest, a subcutaneous pocket was created over the second anterior rib. This was done under sterile conditions and local anesthesia. 1% lidocaine with epinephrine was utilized for this. A 2.5 cm incision was made in the skin. Blunt dissection was performed to create a subcutaneous pocket over the right pectoralis major muscle. The pocket was flushed with saline vigorously. There was adequate hemostasis. The port catheter was assembled and checked for leakage. The port catheter was secured in the pocket with two retention sutures. The tubing was tunneled subcutaneously to the right venotomy site and inserted into the SVC/RA junction through a valved peel-away sheath. Position was confirmed with fluoroscopy. Images were obtained for documentation. The patient tolerated the procedure well. No immediate complications. Incisions were closed in a two layer fashion with 4 - 0 Vicryl suture. Dermabond was applied to the skin. The port catheter was accessed, blood was aspirated followed by saline and heparin flushes. Needle was removed. A dry sterile dressing was applied. IMPRESSION: Ultrasound and fluoroscopically guided right internal jugular single lumen power port catheter insertion. Tip in the SVC/RA junction. Catheter ready for use. Electronically Signed   By: Jerilynn Mages.  Shick M.D.   On: 03/23/2017 15:48    Assessment & Plan:   Metastatic squamous cell carcinoma of unknown origin, with metastatic disease to the sacral region and liver We have brief discussion about plan of care. To treat her metastatic cancer, I plan to administer systemic chemotherapy with carboplatin and Taxol in the outpatient setting if possible. I have consulted pathologist to send her tissue to [facility] further evaluation to determine the source of her cancer.  Based on recent publication/data published at [facility], there is no benefit  of waiting for results of the additional testing.  Combination treatment with carboplatin and Taxol is the standard of care  Severe, uncontrolled cancer pain I attempted to address all her family's questions in regards to pain management. I really believe that she needs time to allow radiation to take effect to control her pain.  Appreciate palliative care assistance in pain management  Opiate-induced constipation, resolved Continue regular laxatives  DVT prophylaxis We will resume Xarelto  Intermittent confusion Likely induced by medication MRI imaging of her brain excluded brain metastasis  CODE STATUS Full code  Weakness and gait instability I will consult PT for assessment and evaluation while she is hospitalized  Goals of care discussion The primary goals of care is to get her pain well controlled in this hospitalization.She has failed outpatient therapy  Discharge planning Unlikely the next 2 days until she is off PCA and pain is reasonably controlled without side effects   [name], MD 03/24/2017  8:19 AM

## 2017-03-24 NOTE — Telephone Encounter (Signed)
Spoke to patient regarding upcoming February appointments per 1/31 sch message.

## 2017-03-24 NOTE — Progress Notes (Signed)
Daily Progress Note   Patient Name: PEGI MILAZZO       Date: 03/24/2017 DOB: 02/19/1966  Age: 52 y.o. MRN#: [id number] Attending Physician: [name], MD Primary Care Physician: Lennie Odor, PA-C Admit Date: 03/21/2017  Reason for Consultation/Follow-up: Non pain symptom management and Pain control  Subjective: I met today with [name], her husband Kasandra Knudsen), and her daughter [name]).  We reviewed her pain and she reports that it has been better controlled over the last 24 hours.  We discussed another period of significant confusion last night, but she and family seem much more at peace with this as she was easily redirected/reoriented.    Reviewed changes to care plan in detail.  See below.  Length of Stay: 3  Current Medications: Scheduled Meds:  . citalopram  20 mg Oral Daily  . dexamethasone  4 mg Oral Q8H  . flecainide  50 mg Oral Daily  . furosemide  40 mg Oral Daily  . gabapentin  300 mg Oral TID  . HYDROmorphone   Intravenous Daily  . lidocaine  1 patch Transdermal Q24H  . multivitamin with minerals  1 tablet Oral Daily  . OLANZapine  5 mg Oral QHS  . polyethylene glycol  17 g Oral Daily  . rivaroxaban  20 mg Oral Daily  . senna  2 tablet Oral BID    Continuous Infusions: . sodium chloride 50 mL/hr at 03/23/17 2105  . ondansetron (ZOFRAN) IV      PRN Meds: acetaminophen, ALPRAZolam, diphenhydrAMINE **OR** diphenhydrAMINE, fentaNYL (SUBLIMAZE) injection, naloxone **AND** sodium chloride flush, ondansetron **OR** ondansetron **OR** ondansetron (ZOFRAN) IV **OR** ondansetron (ZOFRAN) IV  Physical Exam         General: Alert, awake, in no acute distress.  HEENT: No bruits, no goiter, no JVD Heart: Regular rate and rhythm. No murmur appreciated. Lungs:  Good air movement, clear Abdomen: Soft, nontender, nondistended, positive bowel sounds.  Ext: No significant edema Skin: Warm and dry Neuro: Grossly intact, nonfocal.   Vital Signs: BP (!) 167/90 (BP Location: Right Arm)   Pulse 71   Temp 98 F (36.7 C) (Oral)   Resp 15   Ht '5\' 7"'  (1.702 m)   Wt 122.5 kg (270 lb 1 oz)   LMP 02/12/2012   SpO2 97%   BMI 42.30 kg/m  SpO2: SpO2: 97 % O2 Device: O2 Device: Not Delivered O2 Flow Rate: O2 Flow Rate (L/min): 2 L/min  Intake/output summary:   Intake/Output Summary (Last 24 hours) at 03/24/2017 1556 Last data filed at 03/24/2017 0900 Gross per 24 hour  Intake 766.1 ml  Output -  Net 766.1 ml   LBM: Last BM Date: 03/22/17 Baseline Weight: Weight: 122.5 kg (270 lb) Most recent weight: Weight: 122.5 kg (270 lb 1 oz)       Palliative Assessment/Data:    Flowsheet Rows     Most Recent Value  Intake Tab  Referral Department  Hospitalist  Unit at Time of Referral  Oncology Unit  Palliative Care Primary Diagnosis  Cancer  Date Notified  03/21/17  Palliative Care Type  New Palliative care  Reason for referral  Pain  Date of Admission  03/21/17  Date first seen by Palliative Care  03/21/17  # of days Palliative referral response time  0 Day(s)  # of days IP prior to Palliative referral  0  Clinical Assessment  Palliative Performance Scale Score  40%  Pain Max last 24 hours  6  Pain Min Last 24 hours  4  Dyspnea Max Last 24 Hours  4  Dyspnea Min Last 24 hours  3  Nausea Max Last 24 Hours  6  Nausea Min Last 24 Hours  4  Psychosocial & Spiritual Assessment  Palliative Care Outcomes  Patient/Family meeting held?  Yes  Who was at the meeting?  patient, husband, daughter, niece, mother       Patient Active Problem List   Diagnosis Date Noted  . Metastasis to liver of unknown origin ([place]) 03/18/2017  . Dysuria 03/11/2017  . Herpes zoster 03/11/2017  . Cancer of unknown origin ([place]) 03/11/2017  . Metastasis to liver  ([place]) 03/03/2017  . Sacral mass 03/02/2017  . Cancer associated pain 03/02/2017  . Mild single current episode of major depressive disorder ([place]) 08/23/2016  . Cerebral thrombosis with cerebral infarction 12/05/2015  . Stroke (cerebrum) ([place]) 12/05/2015  . Obstructive sleep apnea 07/24/2015  . Morbid obesity ([place]) 03/22/2014  . Preoperative evaluation to rule out surgical contraindication 10/04/2011  . Chest pain, unspecified 06/24/2010  . Shortness of breath 06/24/2010  . Essential hypertension 10/20/2008  . RECTOCELE WITHOUT MENTION OF UTERINE PROLAPSE 10/20/2008  . ENDOMETRIAL POLYP 10/20/2008  . AF (paroxysmal atrial fibrillation) ([place]) 10/20/2008    Palliative Care Assessment & Plan   Patient Profile: 52 year old female with newly diagnosed metastatic squamous cell cancer  Assessment: Patient Active Problem List   Diagnosis Date Noted  . Metastasis to liver of unknown origin ([place]) 03/18/2017  . Dysuria 03/11/2017  . Herpes zoster 03/11/2017  . Cancer of unknown origin ([place]) 03/11/2017  . Metastasis to liver ([place]) 03/03/2017  . Sacral mass 03/02/2017  . Cancer associated pain 03/02/2017  . Mild single current episode of major depressive disorder ([place]) 08/23/2016  . Cerebral thrombosis with cerebral infarction 12/05/2015  . Stroke (cerebrum) ([place]) 12/05/2015  . Obstructive sleep apnea 07/24/2015  . Morbid obesity ([place]) 03/22/2014  . Preoperative evaluation to rule out surgical contraindication 10/04/2011  . Chest pain, unspecified 06/24/2010  . Shortness of breath 06/24/2010  . Essential hypertension 10/20/2008  . RECTOCELE WITHOUT MENTION OF UTERINE PROLAPSE 10/20/2008  . ENDOMETRIAL POLYP 10/20/2008  . AF (paroxysmal atrial fibrillation) ([place]) 10/20/2008    Recommendations/Plan:  Pain: Continues to have increase in pain following radiation treatments.  I reviewed her PCA and she has used 63 mg  of Dilaudid in the last 24 hours.  Will increase Dilaudid 1.5 mg basal  rate while decreaing PCA setting to 1 mg with 15-minute lockout.  I am hopeful this will better control her pain while allowing her better sleep as she reports waking in pain last night.  We did discuss again that overall goal is to get through radiation treatments with the hope that this will decrease her pain overall and allow her to work to titrate down her overall opioid needs.  I did leave an additional order for fentanyl 50 mcg as needed in case her pain becomes uncontrolled again following radiation.  Constipation: Continue senna 2 tabs twice daily.  Continue MiraLAX.  Confusion: Likely multifactorial.  Still on steroids for pain.  This is potentially contributing to confusion as well as well as poor sleep she has been experiencing, but I think that she needs to continue for now.  Appreciate spiritual care facilitating completion of new advance directive paperwork.  Goals of Care and Additional Recommendations:  Limitations on Scope of Treatment: Full Scope Treatment  Code Status:    Code Status Orders  (From admission, onward)        Start     Ordered   03/21/17 1029  Full code  Continuous     03/21/17 1031    Code Status History    Date Active Date Inactive Code Status Order ID Comments User Context   12/05/2015 23:46 12/06/2015 21:30 Full Code [id number]  Rise Patience, MD ED    Advance Directive Documentation     Most Recent Value  Type of Advance Directive  Healthcare Power of [place], Living will  Pre-existing out of facility DNR order (yellow form or pink MOST form)  No data  "MOST" Form in Place?  No data       Prognosis:   Unable to determine  Discharge Planning:  To Be Determined  Care plan was discussed with Patient, husband, daughter  Thank you for allowing the Palliative Medicine Team to assist in the care of this patient.   Total Time 40 Prolonged Time Billed No      Greater than 50%  of this time was spent counseling and coordinating  care related to the above assessment and plan.  Micheline Rough, MD  Please contact Palliative Medicine Team phone at [phone] for questions and concerns.

## 2017-03-25 ENCOUNTER — Ambulatory Visit (HOSPITAL_COMMUNITY): Payer: 59

## 2017-03-25 ENCOUNTER — Other Ambulatory Visit (HOSPITAL_COMMUNITY): Payer: 59

## 2017-03-25 ENCOUNTER — Telehealth: Payer: Self-pay

## 2017-03-25 ENCOUNTER — Ambulatory Visit
Admit: 2017-03-25 | Discharge: 2017-03-25 | Disposition: A | Discharge status: Home / Self Care | Payer: 59 | Attending: Radiation Oncology | Admitting: Radiation Oncology

## 2017-03-25 DIAGNOSIS — Z7901 Long term (current) use of anticoagulants: Secondary | ICD-10-CM

## 2017-03-25 MED ORDER — DEXAMETHASONE 4 MG PO TABS
4.0000 mg | ORAL_TABLET | Freq: Two times a day (BID) | ORAL | Status: DC
  Administered 2017-03-25 – 2017-03-26 (×2): 4 mg via ORAL
  Filled 2017-03-25 (×2): qty 1

## 2017-03-25 NOTE — Progress Notes (Signed)
Cindy Faulkner   DOB:05-28-1965   QV#:[id number]    Subjective: The patient continues to have intermittent confusion especially during nighttime.  Her pain is rated at 4-6 out of 10, improved compared to yesterday's pain rating.  She denies constipation.  Objective:  Vitals:   03/25/17 0547 03/25/17 0634  BP:  (!) 157/101  Pulse:  80  Resp: 10 16  Temp:  97.7 F (36.5 C)  SpO2: 97% 97%     Intake/Output Summary (Last 24 hours) at 03/25/2017 0851 Last data filed at 03/24/2017 0900 Gross per 24 hour  Intake 370 ml  Output -  Net 370 ml    GENERAL:alert, no distress and comfortable NEURO: alert & oriented x 3 with fluent speech, no focal motor/sensory deficits   Labs:  Lab Results  Component Value Date   WBC 17.0 (H) 03/23/2017   HGB 9.7 (L) 03/23/2017   HCT 30.4 (L) 03/23/2017   MCV 84.2 03/23/2017   PLT 650 (H) 03/23/2017   NEUTROABS 13.9 (H) 03/23/2017    Lab Results  Component Value Date   NA 135 03/22/2017   K 4.1 03/22/2017   CL 98 (L) 03/22/2017   CO2 29 03/22/2017    Studies:  Mr Jeri Cos PP Contrast  Result Date: 03/23/2017 CLINICAL DATA:  52 y/o F; metastatic squamous cell carcinoma for staging. Patient with confusion. EXAM: MRI HEAD WITHOUT AND WITH CONTRAST TECHNIQUE: Multiplanar, multiecho pulse sequences of the brain and surrounding structures were obtained without and with intravenous contrast. CONTRAST:  26mL MULTIHANCE GADOBENATE DIMEGLUMINE 529 MG/ML IV SOLN COMPARISON:  12/05/2015 MRI head FINDINGS: Brain: Motion degradation on multiple sequences. No acute infarction, hemorrhage, hydrocephalus, extra-axial collection or mass lesion. Fewnonspecific foci of T2 FLAIR hyperintense signal abnormality in subcortical and periventricular white matter are compatible withmildchronic microvascular ischemic changes for age. Mildbrain parenchymal volume loss. Small chronic lacunar infarct within genu of left internal capsule. No abnormal enhancement of the brain. Vascular:  Normal flow voids. Skull and upper cervical spine: Normal marrow signal. Sinuses/Orbits: Right maxillary sinus mucous retention cyst. Otherwise negative. Other: None. IMPRESSION: 1. No intracranial metastasis identified. 2. Mild chronic microvascular ischemic changes and mild parenchymal volume loss of the brain. 3. Small chronic lacunar infarct within genu of left internal capsule. Electronically Signed   By: Kristine Garbe M.D.   On: 03/23/2017 22:10   Ir US Guide Vasc Access Right  Result Date: 03/23/2017 CLINICAL DATA:  METASTATIC SQUAMOUS CELL CARCINOMA EXAM: RIGHT INTERNAL JUGULAR SINGLE LUMEN POWER PORT CATHETER INSERTION Date:  03/23/2017 03/23/2017 3:41 pm Radiologist:  Jerilynn Mages. Daryll Brod, MD Guidance:  Ultrasound and fluoroscopic MEDICATIONS: Ancef 3 g; The antibiotic was administered within an appropriate time interval prior to skin puncture. ANESTHESIA/SEDATION: Versed 2.0 mg IV; Fentanyl 100 mcg IV; Moderate Sedation Time:  35 minutes The patient was continuously monitored during the procedure by the interventional radiology nurse under my direct supervision. FLUOROSCOPY TIME:  One minutes, 12 seconds (22 mGy) COMPLICATIONS: None immediate. CONTRAST:  None. PROCEDURE: Informed consent was obtained from the patient following explanation of the procedure, risks, benefits and alternatives. The patient understands, agrees and consents for the procedure. All questions were addressed. A time out was performed. Maximal barrier sterile technique utilized including caps, mask, sterile gowns, sterile gloves, large sterile drape, hand hygiene, and 2% chlorhexidine scrub. Under sterile conditions and local anesthesia, right internal jugular micropuncture venous access was performed. Access was performed with ultrasound. Images were obtained for documentation. A guide wire was inserted followed by  a transitional dilator. This allowed insertion of a guide wire and catheter into the IVC. Measurements were  obtained from the SVC / RA junction back to the right IJ venotomy site. In the right infraclavicular chest, a subcutaneous pocket was created over the second anterior rib. This was done under sterile conditions and local anesthesia. 1% lidocaine with epinephrine was utilized for this. A 2.5 cm incision was made in the skin. Blunt dissection was performed to create a subcutaneous pocket over the right pectoralis major muscle. The pocket was flushed with saline vigorously. There was adequate hemostasis. The port catheter was assembled and checked for leakage. The port catheter was secured in the pocket with two retention sutures. The tubing was tunneled subcutaneously to the right venotomy site and inserted into the SVC/RA junction through a valved peel-away sheath. Position was confirmed with fluoroscopy. Images were obtained for documentation. The patient tolerated the procedure well. No immediate complications. Incisions were closed in a two layer fashion with 4 - 0 Vicryl suture. Dermabond was applied to the skin. The port catheter was accessed, blood was aspirated followed by saline and heparin flushes. Needle was removed. A dry sterile dressing was applied. IMPRESSION: Ultrasound and fluoroscopically guided right internal jugular single lumen power port catheter insertion. Tip in the SVC/RA junction. Catheter ready for use. Electronically Signed   By: Jerilynn Mages.  Shick M.D.   On: 03/23/2017 15:48   Ir Fluoro Guide Port Insertion Right  Result Date: 03/23/2017 CLINICAL DATA:  METASTATIC SQUAMOUS CELL CARCINOMA EXAM: RIGHT INTERNAL JUGULAR SINGLE LUMEN POWER PORT CATHETER INSERTION Date:  03/23/2017 03/23/2017 3:41 pm Radiologist:  Jerilynn Mages. Daryll Brod, MD Guidance:  Ultrasound and fluoroscopic MEDICATIONS: Ancef 3 g; The antibiotic was administered within an appropriate time interval prior to skin puncture. ANESTHESIA/SEDATION: Versed 2.0 mg IV; Fentanyl 100 mcg IV; Moderate Sedation Time:  35 minutes The patient was  continuously monitored during the procedure by the interventional radiology nurse under my direct supervision. FLUOROSCOPY TIME:  One minutes, 12 seconds (22 mGy) COMPLICATIONS: None immediate. CONTRAST:  None. PROCEDURE: Informed consent was obtained from the patient following explanation of the procedure, risks, benefits and alternatives. The patient understands, agrees and consents for the procedure. All questions were addressed. A time out was performed. Maximal barrier sterile technique utilized including caps, mask, sterile gowns, sterile gloves, large sterile drape, hand hygiene, and 2% chlorhexidine scrub. Under sterile conditions and local anesthesia, right internal jugular micropuncture venous access was performed. Access was performed with ultrasound. Images were obtained for documentation. A guide wire was inserted followed by a transitional dilator. This allowed insertion of a guide wire and catheter into the IVC. Measurements were obtained from the SVC / RA junction back to the right IJ venotomy site. In the right infraclavicular chest, a subcutaneous pocket was created over the second anterior rib. This was done under sterile conditions and local anesthesia. 1% lidocaine with epinephrine was utilized for this. A 2.5 cm incision was made in the skin. Blunt dissection was performed to create a subcutaneous pocket over the right pectoralis major muscle. The pocket was flushed with saline vigorously. There was adequate hemostasis. The port catheter was assembled and checked for leakage. The port catheter was secured in the pocket with two retention sutures. The tubing was tunneled subcutaneously to the right venotomy site and inserted into the SVC/RA junction through a valved peel-away sheath. Position was confirmed with fluoroscopy. Images were obtained for documentation. The patient tolerated the procedure well. No immediate complications. Incisions were closed  in a two layer fashion with 4 - 0 Vicryl  suture. Dermabond was applied to the skin. The port catheter was accessed, blood was aspirated followed by saline and heparin flushes. Needle was removed. A dry sterile dressing was applied. IMPRESSION: Ultrasound and fluoroscopically guided right internal jugular single lumen power port catheter insertion. Tip in the SVC/RA junction. Catheter ready for use. Electronically Signed   By: Jerilynn Mages.  Shick M.D.   On: 03/23/2017 15:48    Assessment & Plan:   Metastatic squamous cell carcinoma of unknown origin, with metastatic disease to the sacral region and liver We have brief discussion about plan of care. To treat her metastatic cancer, I plan to administer systemic chemotherapy with carboplatin and Taxol in the outpatient setting on 2/12 Ihave consultedpathologist to send her tissue to [facility] further evaluation to determine the source of her cancer.  Based on recent publication/data published at [facility], there is no benefit of waiting for results of the additional testing.  Combination treatment with carboplatin and Taxol is the standard of care  Severe, uncontrolled cancer pain I attempted to address all her family's questions in regards to pain management. I really believe that she needs time to allow radiation to take effect to control her pain. Appreciate palliative care assistance in pain management Overall, her pain is better controlled.  I recommend reducing dexamethasone to twice a day to be given in the morning and afternoon only to avoid effect of steroids on her sleep  Opiate-induced constipation, resolved Continue regular laxatives  DVT prophylaxis We will resume Xarelto  Intermittent confusion Likely induced by medication MRI imaging of her brain excluded brain metastasis  CODE STATUS Full code  Weakness and gait instability I will consult PT for assessment and evaluation while she is hospitalized  Goals of care discussion The primary goals of care is to get  her pain well controlled in this hospitalization.She has failed outpatient therapy  Discharge planning Unlikely the next 2 days until she is off PCA and pain is reasonably controlled without side effects I will consult hospitalist to take over her care over the weekend.   [name], MD 03/25/2017  8:51 AM

## 2017-03-25 NOTE — Progress Notes (Signed)
I spoke with Dr. Allyson Sabal to take over the patient's care since I will be away tomorrow

## 2017-03-25 NOTE — Evaluation (Signed)
Physical Therapy Evaluation Patient Details Name: ILO BEAMON MRN: [id number] DOB: 1966/02/12 Today's Date: 03/25/2017   History of Present Illness  52 yo female Metastatic squamous cell carcinoma of unknown origin, with metastatic disease to the sacral region and liver, history of stroke left internal capsule stroke  Clinical Impression  The patient presents with slight decreased control of the left leg during ambulation. Recommended that patient wear a shoe that fits to the foot(sneaker). Will reassess  Gait  without  The IV pole and steps next visit.   Pt admitted with above diagnosis. Pt currently with functional limitations due to the deficits listed below (see PT Problem List).  Pt will benefit from skilled PT to increase their independence and safety with mobility to allow discharge to the venue listed below.       Follow Up Recommendations No PT follow up    Equipment Recommendations  None recommended by PT    Recommendations for Other Services       Precautions / Restrictions Precautions Precautions: Fall      Mobility  Bed Mobility Overal bed mobility: Needs Assistance;Independent                Transfers Overall transfer level: Needs assistance Equipment used: None Transfers: Sit to/from Stand Sit to Stand: Supervision         General transfer comment: patient moves quickly, cues for safety  Ambulation/Gait Ambulation/Gait assistance: Supervision Ambulation Distance (Feet): 400 Feet   Gait Pattern/deviations: Step-through pattern;Staggering left     General Gait Details: patient  pushed IV pole, noted decreased left foot clearance, at times, left leg adducts slightly. Patient also had on slide on shoes that could contribute to shuffle of the left foot.   Stairs            Wheelchair Mobility    Modified Rankin (Stroke Patients Only)       Balance Overall balance assessment: Independent;Needs assistance Sitting-balance support: Feet  supported;No upper extremity supported Sitting balance-Leahy Scale: Normal     Standing balance support: During functional activity;No upper extremity supported Standing balance-Leahy Scale: Fair                               Pertinent Vitals/Pain Pain Assessment: 0-10 Pain Score: 6  Pain Location: left sacral, sciatic area Pain Descriptors / Indicators: Discomfort Pain Intervention(s): Monitored during session;PCA encouraged    Home Living Family/patient expects to be discharged to:: Private residence Living Arrangements: Spouse/significant other;Children Available Help at Discharge: Family Type of Home: House Home Access: Stairs to enter Entrance Stairs-Rails: Right Entrance Stairs-Number of Steps: 6 Home Layout: Two level;Able to live on main level with bedroom/bathroom Home Equipment: None      Prior Function                 Hand Dominance        Extremity/Trunk Assessment   Upper Extremity Assessment Upper Extremity Assessment: Overall WFL for tasks assessed    Lower Extremity Assessment Lower Extremity Assessment: LLE deficits/detail LLE Deficits / Details: eversion 2+/5, dorsiflexion 3/5, abduction 4/5, decr. light touch great toe and foot. LLE Sensation: decreased light touch    Cervical / Trunk Assessment Cervical / Trunk Assessment: Normal  Communication   Communication: Expressive difficulties(at times  says incorrect words in context.)  Cognition Arousal/Alertness: Awake/alert Behavior During Therapy: WFL for tasks assessed/performed Overall Cognitive Status: Impaired/Different from baseline Area of Impairment: Orientation;Safety/judgement  Orientation Level: Time                    General Comments      Exercises     Assessment/Plan    PT Assessment Patient needs continued PT services  PT Problem List Decreased strength;Decreased balance;Decreased mobility;Decreased safety awareness        PT Treatment Interventions Gait training;Stair training    PT Goals (Current goals can be found in the Care Plan section)  Acute Rehab PT Goals Patient Stated Goal: to walk more,  PT Goal Formulation: With patient/family Time For Goal Achievement: 04/01/17 Potential to Achieve Goals: Good    Frequency Min 3X/week   Barriers to discharge        Co-evaluation               AM-PAC PT "6 Clicks" Daily Activity  Outcome Measure Difficulty turning over in bed (including adjusting bedclothes, sheets and blankets)?: None Difficulty moving from lying on back to sitting on the side of the bed? : None Difficulty sitting down on and standing up from a chair with arms (e.g., wheelchair, bedside commode, etc,.)?: A Little Help needed moving to and from a bed to chair (including a wheelchair)?: A Little Help needed walking in hospital room?: A Little Help needed climbing 3-5 steps with a railing? : A Little 6 Click Score: 20    End of Session   Activity Tolerance: Patient tolerated treatment well Patient left: in chair;with call bell/phone within reach;with family/visitor present Nurse Communication: Mobility status PT Visit Diagnosis: Unsteadiness on feet (R26.81)    Time: 3154-0086 PT Time Calculation (min) (ACUTE ONLY): 16 min   Charges:   PT Evaluation $PT Eval Low Complexity: 1 Low     PT G CodesTresa Endo PT [phone]   Claretha Cooper 03/25/2017, 1:08 PM

## 2017-03-25 NOTE — Telephone Encounter (Signed)
Requested office notes faxed to [facility].

## 2017-03-25 NOTE — Progress Notes (Signed)
Daily Progress Note   Patient Name: Cindy Faulkner       Date: 03/25/2017 DOB: 13-Sep-1965  Age: 52 y.o. MRN#: [id number] Attending Physician: [name], MD Primary Care Physician: Lennie Odor, PA-C Admit Date: 03/21/2017  Reason for Consultation/Follow-up: Non pain symptom management and Pain control  Subjective: I met today with [name] and her daughter [name]).  We reviewed her pain and she reports that it has been better controlled over the last 24 hours.  We discussed another period of significant confusion last night, but she and family seem much more at peace with this as she was easily redirected/reoriented.    Reviewed changes to care plan in detail.  See below.  Length of Stay: 4  Current Medications: Scheduled Meds:  . citalopram  20 mg Oral Daily  . dexamethasone  4 mg Oral BID  . flecainide  50 mg Oral Daily  . furosemide  40 mg Oral Daily  . gabapentin  300 mg Oral TID  . HYDROmorphone   Intravenous Daily  . lidocaine  1 patch Transdermal Q24H  . multivitamin with minerals  1 tablet Oral Daily  . OLANZapine  5 mg Oral QHS  . polyethylene glycol  17 g Oral Daily  . rivaroxaban  20 mg Oral Daily  . senna  2 tablet Oral BID    Continuous Infusions: . sodium chloride 50 mL/hr at 03/23/17 2105  . ondansetron (ZOFRAN) IV      PRN Meds: acetaminophen, ALPRAZolam, diphenhydrAMINE **OR** diphenhydrAMINE, fentaNYL (SUBLIMAZE) injection, naloxone **AND** sodium chloride flush, ondansetron **OR** ondansetron **OR** ondansetron (ZOFRAN) IV **OR** ondansetron (ZOFRAN) IV, sodium chloride flush  Physical Exam         General: Alert, awake, in no acute distress.  HEENT: No bruits, no goiter, no JVD Heart: Regular rate and rhythm. No murmur appreciated. Lungs:  Good air movement, clear Abdomen: Soft, nontender, nondistended, positive bowel sounds.  Ext: No significant edema Skin: Warm and dry Neuro: Grossly intact, nonfocal.   Vital Signs: BP (!) 175/92 (BP Location: Right Arm)   Pulse 78   Temp 99.6 F (37.6 C) (Oral)   Resp 18   Ht '5\' 7"'  (1.702 m)   Wt 122.5 kg (270 lb 1 oz)   LMP 02/12/2012   SpO2 96%   BMI 42.30 kg/m  SpO2: SpO2: 96 % O2 Device: O2 Device: Not Delivered O2 Flow Rate: O2 Flow Rate (L/min): 2 L/min  Intake/output summary:   Intake/Output Summary (Last 24 hours) at 03/25/2017 2247 Last data filed at 03/25/2017 2129 Gross per 24 hour  Intake 5516 ml  Output -  Net 5516 ml   LBM: Last BM Date: 03/22/17 Baseline Weight: Weight: 122.5 kg (270 lb) Most recent weight: Weight: 122.5 kg (270 lb 1 oz)       Palliative Assessment/Data:    Flowsheet Rows     Most Recent Value  Intake Tab  Referral Department  Hospitalist  Unit at Time of Referral  Oncology Unit  Palliative Care Primary Diagnosis  Cancer  Date Notified  03/21/17  Palliative Care Type  New Palliative care  Reason for referral  Pain  Date of Admission  03/21/17  Date first seen by Palliative Care  03/21/17  # of days Palliative referral response time  0 Day(s)  # of days IP prior to Palliative referral  0  Clinical Assessment  Palliative Performance Scale Score  40%  Pain Max last 24 hours  6  Pain Min Last 24 hours  4  Dyspnea Max Last 24 Hours  4  Dyspnea Min Last 24 hours  3  Nausea Max Last 24 Hours  6  Nausea Min Last 24 Hours  4  Psychosocial & Spiritual Assessment  Palliative Care Outcomes  Patient/Family meeting held?  Yes  Who was at the meeting?  patient, husband, daughter, niece, mother       Patient Active Problem List   Diagnosis Date Noted  . Metastasis to liver of unknown origin ([place]) 03/18/2017  . Dysuria 03/11/2017  . Herpes zoster 03/11/2017  . Cancer of unknown origin ([place]) 03/11/2017  . Metastasis to liver ([place])  03/03/2017  . Sacral mass 03/02/2017  . Cancer associated pain 03/02/2017  . Mild single current episode of major depressive disorder ([place]) 08/23/2016  . Cerebral thrombosis with cerebral infarction 12/05/2015  . Stroke (cerebrum) ([place]) 12/05/2015  . Obstructive sleep apnea 07/24/2015  . Morbid obesity ([place]) 03/22/2014  . Preoperative evaluation to rule out surgical contraindication 10/04/2011  . Chest pain, unspecified 06/24/2010  . Shortness of breath 06/24/2010  . Essential hypertension 10/20/2008  . RECTOCELE WITHOUT MENTION OF UTERINE PROLAPSE 10/20/2008  . ENDOMETRIAL POLYP 10/20/2008  . AF (paroxysmal atrial fibrillation) ([place]) 10/20/2008    Palliative Care Assessment & Plan   Patient Profile: 52 year old female with newly diagnosed metastatic squamous cell cancer  Assessment: Patient Active Problem List   Diagnosis Date Noted  . Metastasis to liver of unknown origin ([place]) 03/18/2017  . Dysuria 03/11/2017  . Herpes zoster 03/11/2017  . Cancer of unknown origin ([place]) 03/11/2017  . Metastasis to liver ([place]) 03/03/2017  . Sacral mass 03/02/2017  . Cancer associated pain 03/02/2017  . Mild single current episode of major depressive disorder ([place]) 08/23/2016  . Cerebral thrombosis with cerebral infarction 12/05/2015  . Stroke (cerebrum) ([place]) 12/05/2015  . Obstructive sleep apnea 07/24/2015  . Morbid obesity ([place]) 03/22/2014  . Preoperative evaluation to rule out surgical contraindication 10/04/2011  . Chest pain, unspecified 06/24/2010  . Shortness of breath 06/24/2010  . Essential hypertension 10/20/2008  . RECTOCELE WITHOUT MENTION OF UTERINE PROLAPSE 10/20/2008  . ENDOMETRIAL POLYP 10/20/2008  . AF (paroxysmal atrial fibrillation) ([place]) 10/20/2008    Recommendations/Plan:  Pain: Continues to have increase in pain following radiation treatments.  Doing better overall with increase in basal rate.  I  reviewed her PCA and she has used 58 mg of Dilaudid in the last 24  hours (down slightly from 83m prior 24 hours).  Continue Dilaudid 1.5 mg basal rate and PCA setting of 1 mg with 15-minute lockout.  We did discuss again that overall goal is to get through radiation treatments with the hope that this will decrease her pain overall and allow her to work to titrate down her overall opioid needs.  Consider interventional pain management (Dr.Paul Harkins) to eval if she does not continue to improve.  I did leave an additional order for fentanyl 50 mcg as needed in case her pain becomes uncontrolled again following radiation.  Constipation: Continue senna 2 tabs twice daily.  Continue MiraLAX.  Confusion: Likely multifactorial.  Still on steroids for pain.  This is potentially contributing to confusion as well as well as poor sleep she has been experiencing, agree with starting to wean.  Appreciate spiritual care facilitating completion of new advance directive paperwork.  Goals of Care and Additional Recommendations:  Limitations on Scope of Treatment: Full Scope Treatment  Code Status:    Code Status Orders  (From admission, onward)        Start     Ordered   03/21/17 1029  Full code  Continuous     03/21/17 1031    Code Status History    Date Active Date Inactive Code Status Order ID Comments User Context   12/05/2015 23:46 12/06/2015 21:30 Full Code [id number] KRise Patience MD ED    Advance Directive Documentation     Most Recent Value  Type of Advance Directive  Healthcare Power of [facility] will  Pre-existing out of facility DNR order (yellow form or pink MOST form)  No data  "MOST" Form in Place?  No data       Prognosis:   Unable to determine  Discharge Planning:  To Be Determined  Care plan was discussed with Patient, daughter  Thank you for allowing the Palliative Medicine Team to assist in the care of this patient.   Total Time 20 Prolonged Time Billed No      Greater than 50%  of this time was spent  counseling and coordinating care related to the above assessment and plan.  GMicheline Rough MD  Please contact Palliative Medicine Team phone at [phone] questions and concerns.

## 2017-03-25 NOTE — Progress Notes (Signed)
   03/25/17 1500  Clinical Encounter Type  Visited With Patient  Visit Type Follow-up;Psychological support;Spiritual support  Referral From Patient  Consult/Referral To Chaplain  Spiritual Encounters  Spiritual Needs Emotional;Other (Comment) (Spiritual Care Conversation/Support)  Stress Factors  Patient Stress Factors Health changes;Major life changes   I visited with [name] as a follow-up from our previous conversations. Cindy Faulkner stated that she feels better today and that she can tell when she starts to get confused now.  She is very appreciative of the support.  Please, contact Spiritual Care for further assistance.   [place] M.Div, [facility]

## 2017-03-25 NOTE — Progress Notes (Signed)
[facility] Therapy Treatment Record Phone [phone]   Radiation Therapy was administered to Cindy Faulkner on: 03/25/2017  12:22 PM and was treatment # 4 out of a planned course of 10 treatments.  Radiation Treatment  1). Beam Photons 10-19 MeV  2). Brachytherapy None  3). Stereotactic Radiosurgery None  4). Other Radiation None     [name], Omara Alcon, RT (T)

## 2017-03-26 MED ORDER — DEXAMETHASONE 4 MG PO TABS
4.0000 mg | ORAL_TABLET | Freq: Two times a day (BID) | ORAL | Status: DC
  Administered 2017-03-26 – 2017-04-01 (×12): 4 mg via ORAL
  Filled 2017-03-26 (×12): qty 1

## 2017-03-26 NOTE — Progress Notes (Addendum)
Patient ID: Cindy Faulkner, female   DOB: [birth year], 52 y.o.   MRN: [id number]  PROGRESS NOTE    Cindy Faulkner  RFF:[id number] DOB: 1965-03-09 DOA: 03/21/2017  PCP: Lennie Odor, PA-C   Brief Narrative:  52 year old female with metastatic squamous cell carcinoma of unknown origin, mets to sacral and liver region, admitted for pain management.  Assessment & Plan:   Active Problems:   Sacral mass / Cancer associated pain / Metastasis to liver [facility]) / Cancer of unknown origin [facility]) - oncology to assume care on Monday - Continue current pain management efforts with PCA. Decadron     Atrial fibrillation - CHADS vasc score 4 - On AC with xarelto - Rate controlled with flecainide     CVA - Continue xarelto    DVT prophylaxis: xarelto  Code Status: full code  Family Communication: mother at bedside Disposition Plan: once cleared by primary team    Consultants:   None  Procedures:   None  Antimicrobials:   None   Subjective: Feels little better.  Objective: Vitals:   03/25/17 0634 03/25/17 1622 03/25/17 2128 03/26/17 0448  BP: (!) 157/101 (!) 177/93 (!) 175/92 (!) 163/89  Pulse: 80 79 78 87  Resp: 16 20 18 16   Temp: 97.7 F (36.5 C) 98.3 F (36.8 C) 99.6 F (37.6 C) (!) 97.5 F (36.4 C)  TempSrc: Oral Oral Oral Oral  SpO2: 97% 97% 96% 94%  Weight:      Height:        Intake/Output Summary (Last 24 hours) at 03/26/2017 0933 Last data filed at 03/26/2017 0451 Gross per 24 hour  Intake 6116 ml  Output -  Net 6116 ml   Filed Weights   03/21/17 1040 03/22/17 0503  Weight: 122.5 kg (270 lb) 122.5 kg (270 lb 1 oz)    Examination:  General exam: Appears calm and comfortable  Respiratory system: Clear to auscultation. Respiratory effort normal. Cardiovascular system: S1 & S2 heard, RRR.  Gastrointestinal system: Abdomen is nondistended, soft and nontender. No organomegaly or masses felt. Normal bowel sounds heard. Central nervous system: No focal  neurological deficits. Extremities: Symmetric 5 x 5 power. Skin: No rashes, lesions or ulcers Psychiatry: Judgement and insight appear normal. Mood & affect appropriate.   Data Reviewed: I have personally reviewed following labs and imaging studies  CBC: Recent Labs  Lab 03/22/17 0406 03/23/17 0402  WBC 10.3 17.0*  NEUTROABS 8.0* 13.9*  HGB 9.8* 9.7*  HCT 29.4* 30.4*  MCV 83.1 84.2  PLT 548* 357*   Basic Metabolic Panel: Recent Labs  Lab 03/22/17 0406  NA 135  K 4.1  CL 98*  CO2 29  GLUCOSE 251*  BUN 11  CREATININE 0.48  CALCIUM 8.9   GFR: Estimated Creatinine Clearance: 110.4 mL/min (by C-G formula based on SCr of 0.48 mg/dL). Liver Function Tests: Recent Labs  Lab 03/22/17 0406  AST 30  ALT 25  ALKPHOS 81  BILITOT 0.2*  PROT 7.1  ALBUMIN 2.7*   No results for input(s): LIPASE, AMYLASE in the last 168 hours. No results for input(s): AMMONIA in the last 168 hours. Coagulation Profile: No results for input(s): INR, PROTIME in the last 168 hours. Cardiac Enzymes: No results for input(s): CKTOTAL, CKMB, CKMBINDEX, TROPONINI in the last 168 hours. BNP (last 3 results) No results for input(s): PROBNP in the last 8760 hours. HbA1C: No results for input(s): HGBA1C in the last 72 hours. CBG: No results for input(s): GLUCAP in the last  168 hours. Lipid Profile: No results for input(s): CHOL, HDL, LDLCALC, TRIG, CHOLHDL, LDLDIRECT in the last 72 hours. Thyroid Function Tests: No results for input(s): TSH, T4TOTAL, FREET4, T3FREE, THYROIDAB in the last 72 hours. Anemia Panel: No results for input(s): VITAMINB12, FOLATE, FERRITIN, TIBC, IRON, RETICCTPCT in the last 72 hours. Urine analysis:    Component Value Date/Time   COLORURINE YELLOW 12/05/2015 2033   APPEARANCEUR CLOUDY (A) 12/05/2015 2033   LABSPEC 1.026 12/05/2015 2033   PHURINE 6.0 12/05/2015 2033   GLUCOSEU NEGATIVE 12/05/2015 2033   HGBUR MODERATE (A) 12/05/2015 2033   BILIRUBINUR NEGATIVE  12/05/2015 2033   KETONESUR NEGATIVE 12/05/2015 2033   PROTEINUR NEGATIVE 12/05/2015 2033   UROBILINOGEN 0.2 11/27/2010 1122   NITRITE NEGATIVE 12/05/2015 2033   LEUKOCYTESUR NEGATIVE 12/05/2015 2033   Sepsis Labs: @LABRCNTIP (procalcitonin:4,lacticidven:4)   )No results found for this or any previous visit (from the past 240 hour(s)).    Radiology Studies: Mr Jeri Cos BJ Contrast  Result Date: 03/23/2017 CLINICAL DATA:  52 y/o F; metastatic squamous cell carcinoma for staging. Patient with confusion. EXAM: MRI HEAD WITHOUT AND WITH CONTRAST TECHNIQUE: Multiplanar, multiecho pulse sequences of the brain and surrounding structures were obtained without and with intravenous contrast. CONTRAST:  43mL MULTIHANCE GADOBENATE DIMEGLUMINE 529 MG/ML IV SOLN COMPARISON:  12/05/2015 MRI head FINDINGS: Brain: Motion degradation on multiple sequences. No acute infarction, hemorrhage, hydrocephalus, extra-axial collection or mass lesion. Fewnonspecific foci of T2 FLAIR hyperintense signal abnormality in subcortical and periventricular white matter are compatible withmildchronic microvascular ischemic changes for age. Mildbrain parenchymal volume loss. Small chronic lacunar infarct within genu of left internal capsule. No abnormal enhancement of the brain. Vascular: Normal flow voids. Skull and upper cervical spine: Normal marrow signal. Sinuses/Orbits: Right maxillary sinus mucous retention cyst. Otherwise negative. Other: None. IMPRESSION: 1. No intracranial metastasis identified. 2. Mild chronic microvascular ischemic changes and mild parenchymal volume loss of the brain. 3. Small chronic lacunar infarct within genu of left internal capsule. Electronically Signed   By: Kristine Garbe M.D.   On: 03/23/2017 22:10   Ir US Guide Vasc Access Right  Result Date: 03/23/2017 CLINICAL DATA:  METASTATIC SQUAMOUS CELL CARCINOMA EXAM: RIGHT INTERNAL JUGULAR SINGLE LUMEN POWER PORT CATHETER INSERTION Date:   03/23/2017 03/23/2017 3:41 pm Radiologist:  Jerilynn Mages. Daryll Brod, MD Guidance:  Ultrasound and fluoroscopic MEDICATIONS: Ancef 3 g; The antibiotic was administered within an appropriate time interval prior to skin puncture. ANESTHESIA/SEDATION: Versed 2.0 mg IV; Fentanyl 100 mcg IV; Moderate Sedation Time:  35 minutes The patient was continuously monitored during the procedure by the interventional radiology nurse under my direct supervision. FLUOROSCOPY TIME:  One minutes, 12 seconds (22 mGy) COMPLICATIONS: None immediate. CONTRAST:  None. PROCEDURE: Informed consent was obtained from the patient following explanation of the procedure, risks, benefits and alternatives. The patient understands, agrees and consents for the procedure. All questions were addressed. A time out was performed. Maximal barrier sterile technique utilized including caps, mask, sterile gowns, sterile gloves, large sterile drape, hand hygiene, and 2% chlorhexidine scrub. Under sterile conditions and local anesthesia, right internal jugular micropuncture venous access was performed. Access was performed with ultrasound. Images were obtained for documentation. A guide wire was inserted followed by a transitional dilator. This allowed insertion of a guide wire and catheter into the IVC. Measurements were obtained from the SVC / RA junction back to the right IJ venotomy site. In the right infraclavicular chest, a subcutaneous pocket was created over the second anterior rib. This was done  under sterile conditions and local anesthesia. 1% lidocaine with epinephrine was utilized for this. A 2.5 cm incision was made in the skin. Blunt dissection was performed to create a subcutaneous pocket over the right pectoralis major muscle. The pocket was flushed with saline vigorously. There was adequate hemostasis. The port catheter was assembled and checked for leakage. The port catheter was secured in the pocket with two retention sutures. The tubing was tunneled  subcutaneously to the right venotomy site and inserted into the SVC/RA junction through a valved peel-away sheath. Position was confirmed with fluoroscopy. Images were obtained for documentation. The patient tolerated the procedure well. No immediate complications. Incisions were closed in a two layer fashion with 4 - 0 Vicryl suture. Dermabond was applied to the skin. The port catheter was accessed, blood was aspirated followed by saline and heparin flushes. Needle was removed. A dry sterile dressing was applied. IMPRESSION: Ultrasound and fluoroscopically guided right internal jugular single lumen power port catheter insertion. Tip in the SVC/RA junction. Catheter ready for use. Electronically Signed   By: Jerilynn Mages.  Shick M.D.   On: 03/23/2017 15:48   Ir Fluoro Guide Port Insertion Right  Result Date: 03/23/2017 CLINICAL DATA:  METASTATIC SQUAMOUS CELL CARCINOMA EXAM: RIGHT INTERNAL JUGULAR SINGLE LUMEN POWER PORT CATHETER INSERTION Date:  03/23/2017 03/23/2017 3:41 pm Radiologist:  Jerilynn Mages. Daryll Brod, MD Guidance:  Ultrasound and fluoroscopic MEDICATIONS: Ancef 3 g; The antibiotic was administered within an appropriate time interval prior to skin puncture. ANESTHESIA/SEDATION: Versed 2.0 mg IV; Fentanyl 100 mcg IV; Moderate Sedation Time:  35 minutes The patient was continuously monitored during the procedure by the interventional radiology nurse under my direct supervision. FLUOROSCOPY TIME:  One minutes, 12 seconds (22 mGy) COMPLICATIONS: None immediate. CONTRAST:  None. PROCEDURE: Informed consent was obtained from the patient following explanation of the procedure, risks, benefits and alternatives. The patient understands, agrees and consents for the procedure. All questions were addressed. A time out was performed. Maximal barrier sterile technique utilized including caps, mask, sterile gowns, sterile gloves, large sterile drape, hand hygiene, and 2% chlorhexidine scrub. Under sterile conditions and local  anesthesia, right internal jugular micropuncture venous access was performed. Access was performed with ultrasound. Images were obtained for documentation. A guide wire was inserted followed by a transitional dilator. This allowed insertion of a guide wire and catheter into the IVC. Measurements were obtained from the SVC / RA junction back to the right IJ venotomy site. In the right infraclavicular chest, a subcutaneous pocket was created over the second anterior rib. This was done under sterile conditions and local anesthesia. 1% lidocaine with epinephrine was utilized for this. A 2.5 cm incision was made in the skin. Blunt dissection was performed to create a subcutaneous pocket over the right pectoralis major muscle. The pocket was flushed with saline vigorously. There was adequate hemostasis. The port catheter was assembled and checked for leakage. The port catheter was secured in the pocket with two retention sutures. The tubing was tunneled subcutaneously to the right venotomy site and inserted into the SVC/RA junction through a valved peel-away sheath. Position was confirmed with fluoroscopy. Images were obtained for documentation. The patient tolerated the procedure well. No immediate complications. Incisions were closed in a two layer fashion with 4 - 0 Vicryl suture. Dermabond was applied to the skin. The port catheter was accessed, blood was aspirated followed by saline and heparin flushes. Needle was removed. A dry sterile dressing was applied. IMPRESSION: Ultrasound and fluoroscopically guided right internal jugular single  lumen power port catheter insertion. Tip in the SVC/RA junction. Catheter ready for use. Electronically Signed   By: Jerilynn Mages.  Shick M.D.   On: 03/23/2017 15:48        Scheduled Meds: . citalopram  20 mg Oral Daily  . dexamethasone  4 mg Oral BID  . flecainide  50 mg Oral Daily  . furosemide  40 mg Oral Daily  . gabapentin  300 mg Oral TID  . HYDROmorphone   Intravenous Daily    . lidocaine  1 patch Transdermal Q24H  . multivitamin with minerals  1 tablet Oral Daily  . OLANZapine  5 mg Oral QHS  . polyethylene glycol  17 g Oral Daily  . rivaroxaban  20 mg Oral Daily  . senna  2 tablet Oral BID   Continuous Infusions: . sodium chloride 50 mL/hr at 03/23/17 2105  . ondansetron (ZOFRAN) IV       LOS: 5 days    Time spent: 25 minutes  Greater than 50% of the time spent on counseling and coordinating the care.   Leisa Lenz, MD [facility] Pager [phone]  If 7PM-7AM, please contact night-coverage [email or web address] Password [facility] 03/26/2017, 9:33 AM

## 2017-03-26 NOTE — Progress Notes (Signed)
Daily Progress Note   Patient Name: Cindy Faulkner       Date: 03/26/2017 DOB: 1965/09/28  Age: 52 y.o. MRN#: [id number] Attending Physician: Robbie Lis, MD Primary Care Physician: Lennie Odor, PA-C Admit Date: 03/21/2017  Reason for Consultation/Follow-up: Non pain symptom management and Pain control  Subjective: I met today with [name] and her family.  We reviewed her pain and she reports that it has been better controlled over the last 24 hours.    Reviewed changes to care plan in detail.  See below.  Length of Stay: 5  Current Medications: Scheduled Meds:  . citalopram  20 mg Oral Daily  . dexamethasone  4 mg Oral BID  . flecainide  50 mg Oral Daily  . furosemide  40 mg Oral Daily  . gabapentin  300 mg Oral TID  . HYDROmorphone   Intravenous Daily  . lidocaine  1 patch Transdermal Q24H  . multivitamin with minerals  1 tablet Oral Daily  . OLANZapine  5 mg Oral QHS  . polyethylene glycol  17 g Oral Daily  . rivaroxaban  20 mg Oral Daily  . senna  2 tablet Oral BID    Continuous Infusions: . sodium chloride 50 mL/hr at 03/26/17 1206  . ondansetron (ZOFRAN) IV      PRN Meds: acetaminophen, ALPRAZolam, diphenhydrAMINE **OR** diphenhydrAMINE, fentaNYL (SUBLIMAZE) injection, naloxone **AND** sodium chloride flush, ondansetron **OR** ondansetron **OR** ondansetron (ZOFRAN) IV **OR** ondansetron (ZOFRAN) IV, sodium chloride flush  Physical Exam         General: Alert, awake, in no acute distress.  HEENT: No bruits, no goiter, no JVD Heart: Regular rate and rhythm. No murmur appreciated. Lungs: Good air movement, clear Abdomen: Soft, nontender, nondistended, positive bowel sounds.  Ext: No significant edema Skin: Warm and dry Neuro: Grossly intact,  nonfocal.   Vital Signs: BP 140/69 (BP Location: Right Arm)   Pulse 74   Temp 98.9 F (37.2 C) (Oral)   Resp 16   Ht 5' 7" (1.702 m)   Wt 122.5 kg (270 lb 1 oz)   LMP 02/12/2012   SpO2 93%   BMI 42.30 kg/m  SpO2: SpO2: 93 % O2 Device: O2 Device: Nasal Cannula O2 Flow Rate: O2 Flow Rate (L/min): 2 L/min  Intake/output summary:   Intake/Output Summary (Last  24 hours) at 03/26/2017 2133 Last data filed at 03/26/2017 1959 Gross per 24 hour  Intake 1795 ml  Output -  Net 1795 ml   LBM: Last BM Date: 03/26/17 Baseline Weight: Weight: 122.5 kg (270 lb) Most recent weight: Weight: 122.5 kg (270 lb 1 oz)       Palliative Assessment/Data:    Flowsheet Rows     Most Recent Value  Intake Tab  Referral Department  Hospitalist  Unit at Time of Referral  Oncology Unit  Palliative Care Primary Diagnosis  Cancer  Date Notified  03/21/17  Palliative Care Type  New Palliative care  Reason for referral  Pain  Date of Admission  03/21/17  Date first seen by Palliative Care  03/21/17  # of days Palliative referral response time  0 Day(s)  # of days IP prior to Palliative referral  0  Clinical Assessment  Palliative Performance Scale Score  40%  Pain Max last 24 hours  6  Pain Min Last 24 hours  4  Dyspnea Max Last 24 Hours  4  Dyspnea Min Last 24 hours  3  Nausea Max Last 24 Hours  6  Nausea Min Last 24 Hours  4  Psychosocial & Spiritual Assessment  Palliative Care Outcomes  Patient/Family meeting held?  Yes  Who was at the meeting?  patient, husband, daughter, niece, mother       Patient Active Problem List   Diagnosis Date Noted  . Metastasis to liver of unknown origin (HCC) 03/18/2017  . Dysuria 03/11/2017  . Herpes zoster 03/11/2017  . Cancer of unknown origin (HCC) 03/11/2017  . Metastasis to liver (HCC) 03/03/2017  . Sacral mass 03/02/2017  . Cancer associated pain 03/02/2017  . Mild single current episode of major depressive disorder (HCC) 08/23/2016  . Cerebral  thrombosis with cerebral infarction 12/05/2015  . Stroke (cerebrum) (HCC) 12/05/2015  . Obstructive sleep apnea 07/24/2015  . Morbid obesity (HCC) 03/22/2014  . Preoperative evaluation to rule out surgical contraindication 10/04/2011  . Chest pain, unspecified 06/24/2010  . Shortness of breath 06/24/2010  . Essential hypertension 10/20/2008  . RECTOCELE WITHOUT MENTION OF UTERINE PROLAPSE 10/20/2008  . ENDOMETRIAL POLYP 10/20/2008  . AF (paroxysmal atrial fibrillation) (HCC) 10/20/2008    Palliative Care Assessment & Plan   Patient Profile: 51-year-old female with newly diagnosed metastatic squamous cell cancer  Assessment: Patient Active Problem List   Diagnosis Date Noted  . Metastasis to liver of unknown origin (HCC) 03/18/2017  . Dysuria 03/11/2017  . Herpes zoster 03/11/2017  . Cancer of unknown origin (HCC) 03/11/2017  . Metastasis to liver (HCC) 03/03/2017  . Sacral mass 03/02/2017  . Cancer associated pain 03/02/2017  . Mild single current episode of major depressive disorder (HCC) 08/23/2016  . Cerebral thrombosis with cerebral infarction 12/05/2015  . Stroke (cerebrum) (HCC) 12/05/2015  . Obstructive sleep apnea 07/24/2015  . Morbid obesity (HCC) 03/22/2014  . Preoperative evaluation to rule out surgical contraindication 10/04/2011  . Chest pain, unspecified 06/24/2010  . Shortness of breath 06/24/2010  . Essential hypertension 10/20/2008  . RECTOCELE WITHOUT MENTION OF UTERINE PROLAPSE 10/20/2008  . ENDOMETRIAL POLYP 10/20/2008  . AF (paroxysmal atrial fibrillation) (HCC) 10/20/2008    Recommendations/Plan:  Pain: Reports pain improved today.  Doing better overall with increase in basal rate.  I reviewed her PCA and she has used 47 mg of Dilaudid in the last 24 hours (down from 58mg prior 24 hours).  Continue Dilaudid 1.5 mg basal rate and PCA setting   of 1 mg with 15-minute lockout.  We did discuss again that overall goal is to get through radiation treatments  with the hope that this will decrease her pain overall and allow her to work to titrate down her overall opioid needs.  Plan to begin working down on PCA dosing tomorrow.  Consider interventional pain management (Dr.Paul Harkins) to eval if she does not continue to improve.  I did leave an additional order for fentanyl 50 mcg as needed in case her pain becomes uncontrolled again following radiation.  Constipation: Continue senna 2 tabs twice daily.  Continue MiraLAX.  Confusion: Likely multifactorial.  Still on steroids for pain.  This is potentially contributing to confusion as well as well as poor sleep she has been experiencing.  Updated timing of steroids to 10AM and 4PM.  Appreciate spiritual care facilitating completion of new advance directive paperwork.  Goals of Care and Additional Recommendations:  Limitations on Scope of Treatment: Full Scope Treatment  Code Status:    Code Status Orders  (From admission, onward)        Start     Ordered   03/21/17 1029  Full code  Continuous     03/21/17 1031    Code Status History    Date Active Date Inactive Code Status Order ID Comments User Context   12/05/2015 23:46 12/06/2015 21:30 Full Code [id number]  Kakrakandy, Arshad N, MD ED    Advance Directive Documentation     Most Recent Value  Type of Advance Directive  Healthcare Power of Attorney, Living will  Pre-existing out of facility DNR order (yellow form or pink MOST form)  No data  "MOST" Form in Place?  No data       Prognosis:   Unable to determine  Discharge Planning:  To Be Determined  Care plan was discussed with Patient, daughter, mother, husband  Thank you for allowing the Palliative Medicine Team to assist in the care of this patient.   Total Time 30 Prolonged Time Billed No      Greater than 50%  of this time was spent counseling and coordinating care related to the above assessment and plan.   , MD  Please contact Palliative Medicine  Team phone at [phone] for questions and concerns.             

## 2017-03-27 MED ORDER — HYDROMORPHONE 1 MG/ML IV SOLN
Freq: Every day | INTRAVENOUS | Status: DC
  Administered 2017-03-27: 6.51 mg via INTRAVENOUS
  Administered 2017-03-27: 13.5 mg via INTRAVENOUS
  Administered 2017-03-27: 19:00:00 via INTRAVENOUS
  Administered 2017-03-28: 25 mg via INTRAVENOUS
  Administered 2017-03-28: 6.52 mg via INTRAVENOUS
  Filled 2017-03-27 (×2): qty 25

## 2017-03-27 NOTE — Progress Notes (Signed)
Patient ID: Cindy Faulkner, female   DOB: [birth year], 52 y.o.   MRN: [id number]  PROGRESS NOTE    Cindy Faulkner  PJA:[id number] DOB: Jun 23, 1965 DOA: 03/21/2017  PCP: Cindy Odor, PA-C   Brief Narrative:  52 year old female with metastatic squamous cell carcinoma of unknown origin, mets to sacral and liver region, admitted for pain management.  Assessment & Plan:   Active Problems:   Sacral mass / Cancer associated pain / Metastasis to liver (HCC) / Cancer of unknown origin (HCC) - Continue pain management efforts     Atrial fibrillation - CHADS vasc score 4 - On AC with xarelto - Rate controlled with flecainide     CVA - Continue xarelto    DVT prophylaxis: xarelto  Code Status: full code  Family Communication: husband, mother at bedside  Disposition Plan: per primary    Consultants:   None  Procedures:   None  Antimicrobials:   None   Subjective: No overnight events.  Objective: Vitals:   03/26/17 1433 03/26/17 1459 03/26/17 1958 03/27/17 0434  BP: 133/77  140/69 (!) 179/91  Pulse: 81  74 64  Resp: 16 16 16 16   Temp: 98.6 F (37 C)  98.9 F (37.2 C) 97.7 F (36.5 C)  TempSrc: Oral  Oral Oral  SpO2: 99% 99% 93% 97%  Weight:      Height:        Intake/Output Summary (Last 24 hours) at 03/27/2017 1250 Last data filed at 03/27/2017 0801 Gross per 24 hour  Intake 1595 ml  Output -  Net 1595 ml   Filed Weights   03/21/17 1040 03/22/17 0503  Weight: 122.5 kg (270 lb) 122.5 kg (270 lb 1 oz)    Physical Exam  Constitutional: Appears well-developed and well-nourished. No distress.  CVS: RRR, S1/S2 + Pulmonary: Effort and breath sounds normal, no stridor, rhonchi, wheezes, rales.  Abdominal: Soft. BS +,  no distension, tenderness, rebound or guarding.  Musculoskeletal: Normal range of motion. No edema and no tenderness.  Lymphadenopathy: No lymphadenopathy noted, cervical, inguinal. Neuro: Alert. Normal reflexes, muscle tone coordination. No  cranial nerve deficit. Skin: Skin is warm and dry. No rash noted. Not diaphoretic. No erythema. No pallor.  Psychiatric: Normal mood and affect. Behavior, judgment, thought content normal.    Data Reviewed: I have personally reviewed following labs and imaging studies  CBC: Recent Labs  Lab 03/22/17 0406 03/23/17 0402  WBC 10.3 17.0*  NEUTROABS 8.0* 13.9*  HGB 9.8* 9.7*  HCT 29.4* 30.4*  MCV 83.1 84.2  PLT 548* 341*   Basic Metabolic Panel: Recent Labs  Lab 03/22/17 0406  NA 135  K 4.1  CL 98*  CO2 29  GLUCOSE 251*  BUN 11  CREATININE 0.48  CALCIUM 8.9   GFR: Estimated Creatinine Clearance: 110.4 mL/min (by C-G formula based on SCr of 0.48 mg/dL). Liver Function Tests: Recent Labs  Lab 03/22/17 0406  AST 30  ALT 25  ALKPHOS 81  BILITOT 0.2*  PROT 7.1  ALBUMIN 2.7*   No results for input(s): LIPASE, AMYLASE in the last 168 hours. No results for input(s): AMMONIA in the last 168 hours. Coagulation Profile: No results for input(s): INR, PROTIME in the last 168 hours. Cardiac Enzymes: No results for input(s): CKTOTAL, CKMB, CKMBINDEX, TROPONINI in the last 168 hours. BNP (last 3 results) No results for input(s): PROBNP in the last 8760 hours. HbA1C: No results for input(s): HGBA1C in the last 72 hours. CBG: No results for input(s):  GLUCAP in the last 168 hours. Lipid Profile: No results for input(s): CHOL, HDL, LDLCALC, TRIG, CHOLHDL, LDLDIRECT in the last 72 hours. Thyroid Function Tests: No results for input(s): TSH, T4TOTAL, FREET4, T3FREE, THYROIDAB in the last 72 hours. Anemia Panel: No results for input(s): VITAMINB12, FOLATE, FERRITIN, TIBC, IRON, RETICCTPCT in the last 72 hours. Urine analysis:    Component Value Date/Time   COLORURINE YELLOW 12/05/2015 2033   APPEARANCEUR CLOUDY (A) 12/05/2015 2033   LABSPEC 1.026 12/05/2015 2033   PHURINE 6.0 12/05/2015 2033   GLUCOSEU NEGATIVE 12/05/2015 2033   HGBUR MODERATE (A) 12/05/2015 2033    BILIRUBINUR NEGATIVE 12/05/2015 2033   KETONESUR NEGATIVE 12/05/2015 2033   PROTEINUR NEGATIVE 12/05/2015 2033   UROBILINOGEN 0.2 11/27/2010 1122   NITRITE NEGATIVE 12/05/2015 2033   LEUKOCYTESUR NEGATIVE 12/05/2015 2033   Sepsis Labs: @LABRCNTIP (procalcitonin:4,lacticidven:4)   )No results found for this or any previous visit (from the past 240 hour(s)).    Radiology Studies: Mr Jeri Cos VE Contrast  Result Date: 03/23/2017 CLINICAL DATA:  52 y/o F; metastatic squamous cell carcinoma for staging. Patient with confusion. EXAM: MRI HEAD WITHOUT AND WITH CONTRAST TECHNIQUE: Multiplanar, multiecho pulse sequences of the brain and surrounding structures were obtained without and with intravenous contrast. CONTRAST:  86mL MULTIHANCE GADOBENATE DIMEGLUMINE 529 MG/ML IV SOLN COMPARISON:  12/05/2015 MRI head FINDINGS: Brain: Motion degradation on multiple sequences. No acute infarction, hemorrhage, hydrocephalus, extra-axial collection or mass lesion. Fewnonspecific foci of T2 FLAIR hyperintense signal abnormality in subcortical and periventricular white matter are compatible withmildchronic microvascular ischemic changes for age. Mildbrain parenchymal volume loss. Small chronic lacunar infarct within genu of left internal capsule. No abnormal enhancement of the brain. Vascular: Normal flow voids. Skull and upper cervical spine: Normal marrow signal. Sinuses/Orbits: Right maxillary sinus mucous retention cyst. Otherwise negative. Other: None. IMPRESSION: 1. No intracranial metastasis identified. 2. Mild chronic microvascular ischemic changes and mild parenchymal volume loss of the brain. 3. Small chronic lacunar infarct within genu of left internal capsule. Electronically Signed   By: Kristine Garbe M.D.   On: 03/23/2017 22:10   Ir US Guide Vasc Access Right  Result Date: 03/23/2017 CLINICAL DATA:  METASTATIC SQUAMOUS CELL CARCINOMA EXAM: RIGHT INTERNAL JUGULAR SINGLE LUMEN POWER PORT CATHETER  INSERTION Date:  03/23/2017 03/23/2017 3:41 pm Radiologist:  Jerilynn Mages. Daryll Brod, MD Guidance:  Ultrasound and fluoroscopic MEDICATIONS: Ancef 3 g; The antibiotic was administered within an appropriate time interval prior to skin puncture. ANESTHESIA/SEDATION: Versed 2.0 mg IV; Fentanyl 100 mcg IV; Moderate Sedation Time:  35 minutes The patient was continuously monitored during the procedure by the interventional radiology nurse under my direct supervision. FLUOROSCOPY TIME:  One minutes, 12 seconds (22 mGy) COMPLICATIONS: None immediate. CONTRAST:  None. PROCEDURE: Informed consent was obtained from the patient following explanation of the procedure, risks, benefits and alternatives. The patient understands, agrees and consents for the procedure. All questions were addressed. A time out was performed. Maximal barrier sterile technique utilized including caps, mask, sterile gowns, sterile gloves, large sterile drape, hand hygiene, and 2% chlorhexidine scrub. Under sterile conditions and local anesthesia, right internal jugular micropuncture venous access was performed. Access was performed with ultrasound. Images were obtained for documentation. A guide wire was inserted followed by a transitional dilator. This allowed insertion of a guide wire and catheter into the IVC. Measurements were obtained from the SVC / RA junction back to the right IJ venotomy site. In the right infraclavicular chest, a subcutaneous pocket was created over the second anterior  rib. This was done under sterile conditions and local anesthesia. 1% lidocaine with epinephrine was utilized for this. A 2.5 cm incision was made in the skin. Blunt dissection was performed to create a subcutaneous pocket over the right pectoralis major muscle. The pocket was flushed with saline vigorously. There was adequate hemostasis. The port catheter was assembled and checked for leakage. The port catheter was secured in the pocket with two retention sutures. The  tubing was tunneled subcutaneously to the right venotomy site and inserted into the SVC/RA junction through a valved peel-away sheath. Position was confirmed with fluoroscopy. Images were obtained for documentation. The patient tolerated the procedure well. No immediate complications. Incisions were closed in a two layer fashion with 4 - 0 Vicryl suture. Dermabond was applied to the skin. The port catheter was accessed, blood was aspirated followed by saline and heparin flushes. Needle was removed. A dry sterile dressing was applied. IMPRESSION: Ultrasound and fluoroscopically guided right internal jugular single lumen power port catheter insertion. Tip in the SVC/RA junction. Catheter ready for use. Electronically Signed   By: Jerilynn Mages.  Shick M.D.   On: 03/23/2017 15:48   Ir Fluoro Guide Port Insertion Right  Result Date: 03/23/2017 CLINICAL DATA:  METASTATIC SQUAMOUS CELL CARCINOMA EXAM: RIGHT INTERNAL JUGULAR SINGLE LUMEN POWER PORT CATHETER INSERTION Date:  03/23/2017 03/23/2017 3:41 pm Radiologist:  Jerilynn Mages. Daryll Brod, MD Guidance:  Ultrasound and fluoroscopic MEDICATIONS: Ancef 3 g; The antibiotic was administered within an appropriate time interval prior to skin puncture. ANESTHESIA/SEDATION: Versed 2.0 mg IV; Fentanyl 100 mcg IV; Moderate Sedation Time:  35 minutes The patient was continuously monitored during the procedure by the interventional radiology nurse under my direct supervision. FLUOROSCOPY TIME:  One minutes, 12 seconds (22 mGy) COMPLICATIONS: None immediate. CONTRAST:  None. PROCEDURE: Informed consent was obtained from the patient following explanation of the procedure, risks, benefits and alternatives. The patient understands, agrees and consents for the procedure. All questions were addressed. A time out was performed. Maximal barrier sterile technique utilized including caps, mask, sterile gowns, sterile gloves, large sterile drape, hand hygiene, and 2% chlorhexidine scrub. Under sterile conditions  and local anesthesia, right internal jugular micropuncture venous access was performed. Access was performed with ultrasound. Images were obtained for documentation. A guide wire was inserted followed by a transitional dilator. This allowed insertion of a guide wire and catheter into the IVC. Measurements were obtained from the SVC / RA junction back to the right IJ venotomy site. In the right infraclavicular chest, a subcutaneous pocket was created over the second anterior rib. This was done under sterile conditions and local anesthesia. 1% lidocaine with epinephrine was utilized for this. A 2.5 cm incision was made in the skin. Blunt dissection was performed to create a subcutaneous pocket over the right pectoralis major muscle. The pocket was flushed with saline vigorously. There was adequate hemostasis. The port catheter was assembled and checked for leakage. The port catheter was secured in the pocket with two retention sutures. The tubing was tunneled subcutaneously to the right venotomy site and inserted into the SVC/RA junction through a valved peel-away sheath. Position was confirmed with fluoroscopy. Images were obtained for documentation. The patient tolerated the procedure well. No immediate complications. Incisions were closed in a two layer fashion with 4 - 0 Vicryl suture. Dermabond was applied to the skin. The port catheter was accessed, blood was aspirated followed by saline and heparin flushes. Needle was removed. A dry sterile dressing was applied. IMPRESSION: Ultrasound and fluoroscopically guided  right internal jugular single lumen power port catheter insertion. Tip in the SVC/RA junction. Catheter ready for use. Electronically Signed   By: Jerilynn Mages.  Shick M.D.   On: 03/23/2017 15:48        Scheduled Meds: . citalopram  20 mg Oral Daily  . dexamethasone  4 mg Oral BID  . flecainide  50 mg Oral Daily  . furosemide  40 mg Oral Daily  . gabapentin  300 mg Oral TID  . HYDROmorphone    Intravenous Daily  . lidocaine  1 patch Transdermal Q24H  . multivitamin with minerals  1 tablet Oral Daily  . OLANZapine  5 mg Oral QHS  . polyethylene glycol  17 g Oral Daily  . rivaroxaban  20 mg Oral Daily  . senna  2 tablet Oral BID   Continuous Infusions: . sodium chloride 50 mL/hr at 03/27/17 0810  . ondansetron (ZOFRAN) IV       LOS: 6 days    Time spent: 15 minutes  Greater than 50% of the time spent on counseling and coordinating the care.   Leisa Lenz, MD [facility] Pager [phone]  If 7PM-7AM, please contact night-coverage [email or web address] Password [email or web address] 03/27/2017, 12:50 PM

## 2017-03-27 NOTE — Progress Notes (Signed)
Daily Progress Note   Patient Name: Cindy Faulkner       Date: 03/27/2017 DOB: 10-30-1965  Age: 52 y.o. MRN#: [id number] Attending Physician: Robbie Lis, MD Primary Care Physician: Lennie Odor, PA-C Admit Date: 03/21/2017  Reason for Consultation/Follow-up: Non pain symptom management and Pain control  Subjective: I met today with [name] and her family.  We reviewed her pain and she reports that it has been better controlled over the last 24 hours.  Discussed plan to begin weaning of her PCA.    We also talked some about her hopes regarding treatment moving forward.  She is very appreciative of care she has received and reports having a good relationship with Dr. Alvy Bimler.  At the same time, she and her husband are both troubled about the fact that she has been deemed not to be a candidate for any surgical interventions and report that they may want to pursue a second opinion at tertiary care center regarding possibility of surgical intervention.  They were clear that they are not unhappy with care or care plan, but she may want to have another opinion "for peace of mind that no stone was left unturned."  Reviewed changes to care plan in detail.  See below.  Length of Stay: 6  Current Medications: Scheduled Meds:  . citalopram  20 mg Oral Daily  . dexamethasone  4 mg Oral BID  . flecainide  50 mg Oral Daily  . furosemide  40 mg Oral Daily  . gabapentin  300 mg Oral TID  . [START ON 03/28/2017] HYDROmorphone   Intravenous Daily  . lidocaine  1 patch Transdermal Q24H  . multivitamin with minerals  1 tablet Oral Daily  . OLANZapine  5 mg Oral QHS  . polyethylene glycol  17 g Oral Daily  . rivaroxaban  20 mg Oral Daily  . senna  2 tablet Oral BID    Continuous Infusions: . sodium  chloride 50 mL/hr at 03/27/17 0810  . ondansetron (ZOFRAN) IV      PRN Meds: acetaminophen, ALPRAZolam, diphenhydrAMINE **OR** diphenhydrAMINE, fentaNYL (SUBLIMAZE) injection, naloxone **AND** sodium chloride flush, ondansetron **OR** ondansetron **OR** ondansetron (ZOFRAN) IV **OR** ondansetron (ZOFRAN) IV, sodium chloride flush  Physical Exam         General: Alert, awake, in no acute distress.  HEENT: No bruits, no goiter, no JVD Heart: Regular rate and rhythm. No murmur appreciated. Lungs: Good air movement, clear Abdomen: Soft, nontender, nondistended, positive bowel sounds.  Ext: No significant edema Skin: Warm and dry Neuro: Grossly intact, nonfocal.   Vital Signs: BP (!) 147/79 (BP Location: Right Arm)   Pulse 96   Temp 99.9 F (37.7 C) (Oral)   Resp 16   Ht _0  (1.702 m)   Wt 122.5 kg (270 lb 1 oz)   LMP 02/12/2012   SpO2 95%   BMI 42.30 kg/m  SpO2: SpO2: 95 % O2 Device: O2 Device: Not Delivered O2 Flow Rate: O2 Flow Rate (L/min): 2 L/min  Intake/output summary:   Intake/Output Summary (Last 24 hours) at 03/27/2017 1649 Last data filed at 03/27/2017 0801 Gross per 24 hour  Intake 1595 ml  Output -  Net 1595 ml   LBM: Last BM Date: 03/26/17 Baseline Weight: Weight: 122.5 kg (270 lb) Most recent weight: Weight: 122.5 kg (270 lb 1 oz)       Palliative Assessment/Data:    Flowsheet Rows     Most Recent Value  Intake Tab  Referral Department  Hospitalist  Unit at Time of Referral  Oncology Unit  Palliative Care Primary Diagnosis  Cancer  Date Notified  03/21/17  Palliative Care Type  New Palliative care  Reason for referral  Pain  Date of Admission  03/21/17  Date first seen by Palliative Care  03/21/17  # of days Palliative referral response time  0 Day(s)  # of days IP prior to Palliative referral  0  Clinical Assessment  Palliative Performance Scale Score  40%  Pain Max last 24 hours  6  Pain Min Last 24 hours  4  Dyspnea Max Last 24 Hours  4   Dyspnea Min Last 24 hours  3  Nausea Max Last 24 Hours  6  Nausea Min Last 24 Hours  4  Psychosocial & Spiritual Assessment  Palliative Care Outcomes  Patient/Family meeting held?  Yes  Who was at the meeting?  patient, husband, daughter, niece, mother       Patient Active Problem List   Diagnosis Date Noted  . Metastasis to liver of unknown origin ([place]) 03/18/2017  . Dysuria 03/11/2017  . Herpes zoster 03/11/2017  . Cancer of unknown origin ([place]) 03/11/2017  . Metastasis to liver ([place]) 03/03/2017  . Sacral mass 03/02/2017  . Cancer associated pain 03/02/2017  . Mild single current episode of major depressive disorder ([place]) 08/23/2016  . Cerebral thrombosis with cerebral infarction 12/05/2015  . Stroke (cerebrum) ([place]) 12/05/2015  . Obstructive sleep apnea 07/24/2015  . Morbid obesity ([place]) 03/22/2014  . Preoperative evaluation to rule out surgical contraindication 10/04/2011  . Chest pain, unspecified 06/24/2010  . Shortness of breath 06/24/2010  . Essential hypertension 10/20/2008  . RECTOCELE WITHOUT MENTION OF UTERINE PROLAPSE 10/20/2008  . ENDOMETRIAL POLYP 10/20/2008  . AF (paroxysmal atrial fibrillation) ([place]) 10/20/2008    Palliative Care Assessment & Plan   Patient Profile: 52 year old female with newly diagnosed metastatic squamous cell cancer  Assessment: Patient Active Problem List   Diagnosis Date Noted  . Metastasis to liver of unknown origin ([place]) 03/18/2017  . Dysuria 03/11/2017  . Herpes zoster 03/11/2017  . Cancer of unknown origin ([place]) 03/11/2017  . Metastasis to liver ([place]) 03/03/2017  . Sacral mass 03/02/2017  . Cancer associated pain 03/02/2017  . Mild single current episode of major depressive disorder ([place]) 08/23/2016  . Cerebral thrombosis  with cerebral infarction 12/05/2015  . Stroke (cerebrum) ([place]) 12/05/2015  . Obstructive sleep apnea 07/24/2015  . Morbid obesity ([place]) 03/22/2014  . Preoperative evaluation to rule out surgical  contraindication 10/04/2011  . Chest pain, unspecified 06/24/2010  . Shortness of breath 06/24/2010  . Essential hypertension 10/20/2008  . RECTOCELE WITHOUT MENTION OF UTERINE PROLAPSE 10/20/2008  . ENDOMETRIAL POLYP 10/20/2008  . AF (paroxysmal atrial fibrillation) ([place]) 10/20/2008    Recommendations/Plan:  Pain: Reports pain controlled today.  I reviewed her PCA and she has used 57 mg of Dilaudid in the last 24 hours.  We discussed plan for beginning to wean PCA in order to begin working toward regimen for discharge.  Decrease Dilaudid to 1 mg for basal rate and PCA setting to 0.75 mg with 15-minute lockout.  Consider interventional pain management (Dr.Paul Harkins) to eval if she does not continue to improve.  I did leave an additional order for fentanyl 50 mcg as needed in case her pain becomes uncontrolled again with radiation.  Constipation: Continue senna 2 tabs twice daily.  Continue MiraLAX.  Confusion: Likely multifactorial.  Still on steroids for pain but confusion appears to have improved with change of timing of steroids to 10AM and 4PM.  Appreciate spiritual care facilitating completion of new advance directive paperwork.  Goals of Care and Additional Recommendations:  Limitations on Scope of Treatment: Full Scope Treatment   Patient reports understanding that she has been told that she is not a candidate for surgery.  She and her husband are both interested in possibly having a second opinion regarding potential for surgical intervention.  Code Status:    Code Status Orders  (From admission, onward)        Start     Ordered   03/21/17 1029  Full code  Continuous     03/21/17 1031    Code Status History    Date Active Date Inactive Code Status Order ID Comments User Context   12/05/2015 23:46 12/06/2015 21:30 Full Code [id number]  Rise Patience, MD ED    Advance Directive Documentation     Most Recent Value  Type of Advance Directive  Healthcare Power  of Attorney, Living will  Pre-existing out of facility DNR order (yellow form or pink MOST form)  No data  "MOST" Form in Place?  No data       Prognosis:   Unable to determine  Discharge Planning:  To Be Determined  Care plan was discussed with Patient and husband  Thank you for allowing the Palliative Medicine Team to assist in the care of this patient.   Total Time 40 Prolonged Time Billed No      Greater than 50%  of this time was spent counseling and coordinating care related to the above assessment and plan.  Micheline Rough, MD  Please contact Palliative Medicine Team phone at [phone] for questions and concerns.

## 2017-03-28 ENCOUNTER — Ambulatory Visit
Admit: 2017-03-28 | Discharge: 2017-03-28 | Disposition: A | Discharge status: Home / Self Care | Payer: 59 | Attending: Radiation Oncology | Admitting: Radiation Oncology

## 2017-03-28 MED ORDER — HYDROMORPHONE 1 MG/ML IV SOLN
INTRAVENOUS | Status: DC
  Administered 2017-03-28: 23:00:00 via INTRAVENOUS
  Administered 2017-03-28: 8.62 mg via INTRAVENOUS
  Administered 2017-03-29: 5.12 mg via INTRAVENOUS
  Administered 2017-03-29: 6.67 mg via INTRAVENOUS
  Administered 2017-03-29: 2.26 mg via INTRAVENOUS
  Administered 2017-03-29: 25 mg via INTRAVENOUS
  Administered 2017-03-29: 6.92 mg via INTRAVENOUS
  Administered 2017-03-30: 4.78 mg via INTRAVENOUS
  Administered 2017-03-30: 6.07 mg via INTRAVENOUS
  Administered 2017-03-30: 2.67 mg via INTRAVENOUS
  Filled 2017-03-28 (×3): qty 25

## 2017-03-28 NOTE — Progress Notes (Signed)
Physical Therapy Treatment Patient Details Name: Cindy Faulkner MRN: [id number] DOB: [birth year] Today's Date: 03/28/2017    History of Present Illness 52 yo female Metastatic squamous cell carcinoma of unknown origin, with metastatic disease to the sacral region and liver, history of stroke left internal capsule stroke    PT Comments    Patient progressing with increased distance, but noted L foot catching twice minguard for safety.  Used cane with improved safety and slower pace.  Will continue to benefit from skilled PT in the acute setting prior to d/c home.    Follow Up Recommendations  No PT follow up     Equipment Recommendations  None recommended by PT    Recommendations for Other Services       Precautions / Restrictions Precautions Precautions: Fall    Mobility  Bed Mobility Overal bed mobility: Independent                Transfers Overall transfer level: Independent                  Ambulation/Gait Ambulation/Gait assistance: Supervision;Min guard Ambulation Distance (Feet): 800 Feet(400') Assistive device: Straight cane Gait Pattern/deviations: Step-through pattern;Decreased dorsiflexion - left;Steppage     General Gait Details: initially not using IV pole, after first 400' started catching L foot minguard for safety; wearing tennis shoes; used cane last lap and pt with slower pace, cues for technique and improved L foot clearance and heel strike   Stairs Stairs: Yes   Stair Management: One rail Left;Step to pattern;Forwards Number of Stairs: 4(x 2) General stair comments: assist for safety on stairs, noted weakness L foot so ascend with R and descend with L one rail, initially facing forwards, demo sideways to descend and pt practiced with improved controlled lowering  Wheelchair Mobility    Modified Rankin (Stroke Patients Only)       Balance     Sitting balance-Leahy Scale: Good     Standing balance support: During functional  activity Standing balance-Leahy Scale: Good Standing balance comment: leaning over in standing to plug in IV pole                            Cognition Arousal/Alertness: Awake/alert Behavior During Therapy: WFL for tasks assessed/performed Overall Cognitive Status: Impaired/Different from baseline Area of Impairment: Attention                   Current Attention Level: Selective     Safety/Judgement: Decreased awareness of deficits;Decreased awareness of safety            Exercises      General Comments General comments (skin integrity, edema, etc.): Discussed best to use cane on uneven terrain and in crowded situations for safety      Pertinent Vitals/Pain Pain Assessment: Faces Faces Pain Scale: Hurts little more Pain Location: left sacral, sciatic area Pain Descriptors / Indicators: Discomfort Pain Intervention(s): Monitored during session;Repositioned    Home Living                      Prior Function            PT Goals (current goals can now be found in the care plan section) Progress towards PT goals: Progressing toward goals    Frequency    Min 3X/week      PT Plan Current plan remains appropriate    Co-evaluation  AM-PAC PT "6 Clicks" Daily Activity  Outcome Measure  Difficulty turning over in bed (including adjusting bedclothes, sheets and blankets)?: None Difficulty moving from lying on back to sitting on the side of the bed? : None Difficulty sitting down on and standing up from a chair with arms (e.g., wheelchair, bedside commode, etc,.)?: A Little Help needed moving to and from a bed to chair (including a wheelchair)?: A Little Help needed walking in hospital room?: A Little Help needed climbing 3-5 steps with a railing? : A Little 6 Click Score: 20    End of Session   Activity Tolerance: Patient tolerated treatment well Patient left: in bed;with call bell/phone within reach   PT Visit  Diagnosis: Unsteadiness on feet (R26.81);Muscle weakness (generalized) (M62.81)     Time: 9528-4132 PT Time Calculation (min) (ACUTE ONLY): 33 min  Charges:  $Gait Training: 23-37 mins                    G CodesMagda Kiel, [place] [phone] 03/28/2017    Reginia Naas 03/28/2017, 3:28 PM

## 2017-03-28 NOTE — Progress Notes (Addendum)
Pt ambulating in hall with family, tolerating well. Pt has had 5/10 radiation tx. Family at bedside. Rutha has a very positive attitude. Incentive spirometer given to pt.

## 2017-03-28 NOTE — Progress Notes (Signed)
Daily Progress Note   Patient Name: Cindy Faulkner       Date: 03/28/2017 DOB: [birth year]  Age: 53 y.o. MRN#: [id number] Attending Physician: Robbie Lis, MD Primary Care Physician: Lennie Odor, PA-C Admit Date: 03/21/2017  Reason for Consultation/Follow-up: Non pain symptom management and Pain control  Subjective: I met today with Cindy Faulkner and her daughter.  We reviewed her pain and she reports that it has been controlled over the last 24 hours.  Discussed plan for weaning of her PCA.    Reviewed changes to care plan in detail.  See below.  Length of Stay: 7  Current Medications: Scheduled Meds:  . citalopram  20 mg Oral Daily  . dexamethasone  4 mg Oral BID  . flecainide  50 mg Oral Daily  . furosemide  40 mg Oral Daily  . gabapentin  300 mg Oral TID  . HYDROmorphone   Intravenous Q4H  . lidocaine  1 patch Transdermal Q24H  . multivitamin with minerals  1 tablet Oral Daily  . OLANZapine  5 mg Oral QHS  . polyethylene glycol  17 g Oral Daily  . rivaroxaban  20 mg Oral Daily  . senna  2 tablet Oral BID    Continuous Infusions: . sodium chloride 50 mL/hr at 03/27/17 0810  . ondansetron (ZOFRAN) IV      PRN Meds: acetaminophen, ALPRAZolam, diphenhydrAMINE **OR** diphenhydrAMINE, fentaNYL (SUBLIMAZE) injection, naloxone **AND** sodium chloride flush, ondansetron **OR** ondansetron **OR** ondansetron (ZOFRAN) IV **OR** ondansetron (ZOFRAN) IV, sodium chloride flush  Physical Exam         General: Alert, awake, in no acute distress.  HEENT: No bruits, no goiter, no JVD Heart: Regular rate and rhythm. No murmur appreciated. Lungs: Good air movement, clear Abdomen: Soft, nontender, nondistended, positive bowel sounds.  Ext: No significant edema Skin: Warm and  dry Neuro: Grossly intact, nonfocal.   Vital Signs: BP (!) 148/74 (BP Location: Right Arm)   Pulse 61   Temp 98.3 F (36.8 C) (Oral)   Resp (!) 22   Ht _0  (1.702 m)   Wt 122.5 kg (270 lb 1 oz)   LMP 02/12/2012   SpO2 98%   BMI 42.30 kg/m  SpO2: SpO2: 98 % O2 Device: O2 Device: Not Delivered O2 Flow Rate: O2 Flow Rate (L/min): 2  L/min  Intake/output summary:   Intake/Output Summary (Last 24 hours) at 03/28/2017 2150 Last data filed at 03/28/2017 1900 Gross per 24 hour  Intake 4486.67 ml  Output -  Net 4486.67 ml   LBM: Last BM Date: 03/27/17 Baseline Weight: Weight: 122.5 kg (270 lb) Most recent weight: Weight: 122.5 kg (270 lb 1 oz)       Palliative Assessment/Data:    Flowsheet Rows     Most Recent Value  Intake Tab  Referral Department  Hospitalist  Unit at Time of Referral  Oncology Unit  Palliative Care Primary Diagnosis  Cancer  Date Notified  03/21/17  Palliative Care Type  New Palliative care  Reason for referral  Pain  Date of Admission  03/21/17  Date first seen by Palliative Care  03/21/17  # of days Palliative referral response time  0 Day(s)  # of days IP prior to Palliative referral  0  Clinical Assessment  Palliative Performance Scale Score  40%  Pain Max last 24 hours  6  Pain Min Last 24 hours  4  Dyspnea Max Last 24 Hours  4  Dyspnea Min Last 24 hours  3  Nausea Max Last 24 Hours  6  Nausea Min Last 24 Hours  4  Psychosocial & Spiritual Assessment  Palliative Care Outcomes  Patient/Family meeting held?  Yes  Who was at the meeting?  patient, husband, daughter, niece, mother       Patient Active Problem List   Diagnosis Date Noted  . Metastasis to liver of unknown origin ([place]) 03/18/2017  . Dysuria 03/11/2017  . Herpes zoster 03/11/2017  . Cancer of unknown origin ([place]) 03/11/2017  . Metastasis to liver ([place]) 03/03/2017  . Sacral mass 03/02/2017  . Cancer associated pain 03/02/2017  . Mild single current episode of major  depressive disorder ([place]) 08/23/2016  . Cerebral thrombosis with cerebral infarction 12/05/2015  . Stroke (cerebrum) ([place]) 12/05/2015  . Obstructive sleep apnea 07/24/2015  . Morbid obesity ([place]) 03/22/2014  . Preoperative evaluation to rule out surgical contraindication 10/04/2011  . Chest pain, unspecified 06/24/2010  . Shortness of breath 06/24/2010  . Essential hypertension 10/20/2008  . RECTOCELE WITHOUT MENTION OF UTERINE PROLAPSE 10/20/2008  . ENDOMETRIAL POLYP 10/20/2008  . AF (paroxysmal atrial fibrillation) ([place]) 10/20/2008    Palliative Care Assessment & Plan   Patient Profile: 52 year old female with newly diagnosed metastatic squamous cell cancer  Assessment: Patient Active Problem List   Diagnosis Date Noted  . Metastasis to liver of unknown origin ([place]) 03/18/2017  . Dysuria 03/11/2017  . Herpes zoster 03/11/2017  . Cancer of unknown origin ([place]) 03/11/2017  . Metastasis to liver ([place]) 03/03/2017  . Sacral mass 03/02/2017  . Cancer associated pain 03/02/2017  . Mild single current episode of major depressive disorder ([place]) 08/23/2016  . Cerebral thrombosis with cerebral infarction 12/05/2015  . Stroke (cerebrum) ([place]) 12/05/2015  . Obstructive sleep apnea 07/24/2015  . Morbid obesity ([place]) 03/22/2014  . Preoperative evaluation to rule out surgical contraindication 10/04/2011  . Chest pain, unspecified 06/24/2010  . Shortness of breath 06/24/2010  . Essential hypertension 10/20/2008  . RECTOCELE WITHOUT MENTION OF UTERINE PROLAPSE 10/20/2008  . ENDOMETRIAL POLYP 10/20/2008  . AF (paroxysmal atrial fibrillation) ([place]) 10/20/2008    Recommendations/Plan:  Pain: Reports pain controlled today.  I reviewed her PCA and she has used 47 mg of Dilaudid in the last 24 hours.  We discussed plan for continuing to wean PCA in order to begin working toward regimen  for discharge.  Decrease Dilaudid to 0.75 mg for basal rate and PCA setting to 0.5 mg with 15-minute lockout.   Consider interventional pain management (Dr.Paul Harkins) to eval if she does not continue to improve.  I did leave an additional order for fentanyl 50 mcg as needed in case her pain becomes uncontrolled again with radiation.  Constipation: Continue senna 2 tabs twice daily.  Continue MiraLAX.  Confusion: Likely multifactorial.  Still on steroids for pain but confusion appears to have improved with change of timing of steroids to 10AM and 4PM.  Appreciate spiritual care facilitating completion of new advance directive paperwork.  Goals of Care and Additional Recommendations:  Limitations on Scope of Treatment: Full Scope Treatment   Patient reports understanding that she has been told that she is not a candidate for surgery.  She discussed being interested in possibly having a second opinion regarding potential for surgical intervention.  Code Status:    Code Status Orders  (From admission, onward)        Start     Ordered   03/21/17 1029  Full code  Continuous     03/21/17 1031    Code Status History    Date Active Date Inactive Code Status Order ID Comments User Context   12/05/2015 23:46 12/06/2015 21:30 Full Code [id number]  Rise Patience, MD ED    Advance Directive Documentation     Most Recent Value  Type of Advance Directive  Healthcare Power of Attorney, Living will  Pre-existing out of facility DNR order (yellow form or pink MOST form)  No data  "MOST" Form in Place?  No data       Prognosis:   Unable to determine  Discharge Planning:  To Be Determined  Care plan was discussed with Patient and daughter  Thank you for allowing the Palliative Medicine Team to assist in the care of this patient.   Total Time 30 Prolonged Time Billed No      Greater than 50%  of this time was spent counseling and coordinating care related to the above assessment and plan.  Micheline Rough, MD  Please contact Palliative Medicine Team phone at [phone] for questions  and concerns.

## 2017-03-28 NOTE — Progress Notes (Signed)
Cindy Faulkner   DOB:[birth year]   EU#:[id number]    Subjective: She appears to be responding well to treatment.  Pain control has improved.  She has less confusion.  She continues to complain of discomfort in the sacral region that feels like spasm.  She denies urinary retention or difficulties with bowel movement.  No focal neurological deficit  Objective:  Vitals:   03/27/17 2216 03/28/17 0548  BP: (!) 130/95 (!) 172/87  Pulse: 64 68  Resp: 16 20  Temp: (!) 97.5 F (36.4 C) 97.6 F (36.4 C)  SpO2: 97% 96%     Intake/Output Summary (Last 24 hours) at 03/28/2017 1411 Last data filed at 03/28/2017 1345 Gross per 24 hour  Intake 600 ml  Output -  Net 600 ml    GENERAL:alert, no distress and comfortable NEURO: alert & oriented x 3 with fluent speech, no focal motor/sensory deficits   Labs:  Lab Results  Component Value Date   WBC 17.0 (H) 03/23/2017   HGB 9.7 (L) 03/23/2017   HCT 30.4 (L) 03/23/2017   MCV 84.2 03/23/2017   PLT 650 (H) 03/23/2017   NEUTROABS 13.9 (H) 03/23/2017    Lab Results  Component Value Date   NA 135 03/22/2017   K 4.1 03/22/2017   CL 98 (L) 03/22/2017   CO2 29 03/22/2017   Assessment & Plan:   Metastatic squamous cell carcinoma of unknown origin, with metastatic disease to the sacral region and liver We have brief discussion about plan of care. To treat her metastatic cancer, I plan to administer systemic chemotherapy with carboplatin and Taxolin the outpatient setting on 2/12 Ihave consultedpathologist to send her tissue to [place] further evaluation to determine the source of her cancer.Based on recent publication/data published at [facility], there is no benefit of waiting for results of the additional testing. Combination treatment with carboplatin and Taxol is thestandard of care The patient inquired about second opinion regarding surgery.  I try my best to explain to the patient, with stage IV disease, surgical resection of all known  metastatic disease sites is not recommended and does not make sense.  She is also inquiring about a MRI as soon as radiation is completed to see if her tumor has shrunk I told her, in general, we do not repeat imaging study for at least 6 weeks after radiation due to lack of change if we repeat the imaging study too soon. The patient inquired about second opinion elsewhere because she does not want chemotherapy unless she knew that treatment now is already working.   Overall, I felt that she has poor understanding about management of stage IV disease, despite numerous discussiosn in the past She tried her best to write down the answer to her questions I recommend another meeting with her family to discuss goals of care and general approach to treatment As of right now, she is scheduled to finish her radiation next week.  She is also scheduled to start chemotherapy next week.  If she is not comfortable to proceed with the treatment as scheduled and wanted a second opinion, I would recommend direct hospital transfer next week to a tertiary referring center for further management.  Severe, uncontrolled cancer pain Pain control has improved.  I appreciate palliative care service to assist in pain management.  Opiate-induced constipation, resolved Continue regular laxatives  DVT prophylaxis On Xarelto  Intermittent confusion Likely induced by medication MRI imaging of her brain excludedbrain metastasis  CODE STATUS Full code  Weakness and  gait instability Continue PT for assessment and evaluation while she is hospitalized  Goals of care discussion The primary goals of care is to get her pain well controlled in this hospitalization.She has failed outpatient therapy  Discharge planning The patient has brought many questions since last time I saw her.  For now, I would recommend her to remain under the hospitalist care.  It is not clear to me that the patient would want to continue  treatment with me eventually as she has verbalized her desires for second opinion  [name], MD 03/28/2017  2:11 PM

## 2017-03-28 NOTE — Progress Notes (Signed)
Onc prefers [facility] primary, will take over from 2/5.  Thank you Leisa Lenz

## 2017-03-29 ENCOUNTER — Ambulatory Visit
Admit: 2017-03-29 | Discharge: 2017-03-29 | Disposition: A | Discharge status: Home / Self Care | Payer: 59 | Attending: Radiation Oncology | Admitting: Radiation Oncology

## 2017-03-29 NOTE — Progress Notes (Signed)
Cindy Faulkner   DOB:[birth year]   TM#:[id number]    Subjective: I met with the patient and her daughter today.  The patient reported no confusion last night.  She has good pain control even despite reduction in PCA.  She denies urinary retention or constipation.  She is able to walk with physical therapy on the cane  Objective:  Vitals:   03/28/17 2040 03/29/17 0446  BP: (!) 148/74 (!) 151/99  Pulse: 61 62  Resp:  20  Temp: 98.3 F (36.8 C) 97.9 F (36.6 C)  SpO2:  98%     Intake/Output Summary (Last 24 hours) at 03/29/2017 0901 Last data filed at 03/29/2017 0308 Gross per 24 hour  Intake 4391.67 ml  Output -  Net 4391.67 ml    GENERAL:alert, no distress and comfortable NEURO: alert & oriented x 3 with fluent speech, no focal motor/sensory deficits   Labs:  Lab Results  Component Value Date   WBC 17.0 (H) 03/23/2017   HGB 9.7 (L) 03/23/2017   HCT 30.4 (L) 03/23/2017   MCV 84.2 03/23/2017   PLT 650 (H) 03/23/2017   NEUTROABS 13.9 (H) 03/23/2017    Lab Results  Component Value Date   NA 135 03/22/2017   K 4.1 03/22/2017   CL 98 (L) 03/22/2017   CO2 29 03/22/2017    Assessment & Plan:   Metastatic squamous cell carcinoma of unknown origin, with metastatic disease to the sacral region and liver We have brief discussion about plan of care. To treat her metastatic cancer, I plan to administer systemic chemotherapy with carboplatin and Taxolin the outpatient settingon 2/12 Ihave consultedpathologist to send her tissue to [place] further evaluation to determine the source of her cancer.Based on recent publication/data published at [facility], there is no benefit of waiting for results of the additional testing. Combination treatment with carboplatin and Taxol is thestandard of care The patient inquired about second opinion regarding surgery.  I try my best to explain to the patient, with stage IV disease, surgical resection of all known metastatic disease sites is not  recommended.  Ultimately, we agreed to consult general surgery for discussion about the role of surgical resection  Severe, uncontrolled cancer pain Pain control has improved.  I appreciate palliative care service to assist in pain management.  Opiate-induced constipation, resolved Continue regular laxatives  DVT prophylaxis On Xarelto  Intermittent confusion, resolving Likely induced by medication MRI imaging of her brain excludedbrain metastasis  CODE STATUS Full code  Weakness and gait instability Continue PT for assessment and evaluation while she is hospitalized  Goals of care discussion The primary goals of care is to get her pain well controlled in this hospitalization.She has failed outpatient therapy  Discharge planning I am hopeful she can be discharged within the next few days and to complete her final treatment next week in the outpatient setting  [name], MD 03/29/2017  9:01 AM

## 2017-03-29 NOTE — Progress Notes (Signed)
Patient ID: Cindy Faulkner, female   DOB: May 09, 1965, 52 y.o.   MRN: [id number]  PROGRESS NOTE    Cindy Faulkner  NKN:[id number] DOB: 1965/05/19 DOA: 03/21/2017  PCP: Lennie Odor, PA-C   Brief Narrative:  52 year old female with metastatic squamous cell carcinoma of unknown origin, mets to sacral and liver region, admitted for pain management.  Assessment & Plan:   Active Problems:   Sacral mass / Cancer associated pain / Metastasis to liver [facility]) / Cancer of unknown origin ([place]) - Continue pain management efforts  - Appreciate oncology following  - Appreciate palliative following as well  - Radiation therapy through next Monday     Atrial fibrillation - CHADS vasc score 4 - On AC with xarelto - Rate controlled with flecainide     CVA - Continue xarelto    DVT prophylaxis: xarelto  Code Status: full code  Family Communication: mother, daughter at bedside  Disposition Plan: hme once pain controlled    Consultants:   Oncology  PCT  Radiation oncology   Procedures:   RT  Antimicrobials:   None   Subjective: Pain controlled this am.  Objective: Vitals:   03/28/17 2040 03/28/17 2055 03/28/17 2057 03/29/17 0446  BP: (!) 148/74   (!) 151/99  Pulse: 61   62  Resp: 18 (!) 22 (!) 22 20  Temp: 98.3 F (36.8 C)   97.9 F (36.6 C)  TempSrc: Oral   Oral  SpO2: 98% 98%  98%  Weight:      Height:        Intake/Output Summary (Last 24 hours) at 03/29/2017 0955 Last data filed at 03/29/2017 0939 Gross per 24 hour  Intake 4631.67 ml  Output -  Net 4631.67 ml   Filed Weights   03/21/17 1040 03/22/17 0503  Weight: 122.5 kg (270 lb) 122.5 kg (270 lb 1 oz)    Physical Exam  Constitutional: Appears well-developed and well-nourished. No distress.  CVS: RRR, S1/S2 + Pulmonary: Effort and breath sounds normal, no stridor, rhonchi, wheezes, rales.  Abdominal: Soft. BS +,  no distension, tenderness, rebound or guarding.  Musculoskeletal: Normal range of motion. No  edema and no tenderness.  Lymphadenopathy: No lymphadenopathy noted, cervical, inguinal. Neuro: Alert. Normal reflexes, muscle tone coordination. No cranial nerve deficit. Skin: Skin is warm and dry. No rash noted. Not diaphoretic. No erythema. No pallor.  Psychiatric: Normal mood and affect. Behavior, judgment, thought content normal.     Data Reviewed: I have personally reviewed following labs and imaging studies  CBC: Recent Labs  Lab 03/23/17 0402  WBC 17.0*  NEUTROABS 13.9*  HGB 9.7*  HCT 30.4*  MCV 84.2  PLT 379*   Basic Metabolic Panel: No results for input(s): NA, K, CL, CO2, GLUCOSE, BUN, CREATININE, CALCIUM, MG, PHOS in the last 168 hours. GFR: Estimated Creatinine Clearance: 110.4 mL/min (by C-G formula based on SCr of 0.48 mg/dL). Liver Function Tests: No results for input(s): AST, ALT, ALKPHOS, BILITOT, PROT, ALBUMIN in the last 168 hours. No results for input(s): LIPASE, AMYLASE in the last 168 hours. No results for input(s): AMMONIA in the last 168 hours. Coagulation Profile: No results for input(s): INR, PROTIME in the last 168 hours. Cardiac Enzymes: No results for input(s): CKTOTAL, CKMB, CKMBINDEX, TROPONINI in the last 168 hours. BNP (last 3 results) No results for input(s): PROBNP in the last 8760 hours. HbA1C: No results for input(s): HGBA1C in the last 72 hours. CBG: No results for input(s): GLUCAP in the  last 168 hours. Lipid Profile: No results for input(s): CHOL, HDL, LDLCALC, TRIG, CHOLHDL, LDLDIRECT in the last 72 hours. Thyroid Function Tests: No results for input(s): TSH, T4TOTAL, FREET4, T3FREE, THYROIDAB in the last 72 hours. Anemia Panel: No results for input(s): VITAMINB12, FOLATE, FERRITIN, TIBC, IRON, RETICCTPCT in the last 72 hours. Urine analysis:    Component Value Date/Time   COLORURINE YELLOW 12/05/2015 2033   APPEARANCEUR CLOUDY (A) 12/05/2015 2033   LABSPEC 1.026 12/05/2015 2033   PHURINE 6.0 12/05/2015 2033   GLUCOSEU  NEGATIVE 12/05/2015 2033   HGBUR MODERATE (A) 12/05/2015 2033   BILIRUBINUR NEGATIVE 12/05/2015 2033   KETONESUR NEGATIVE 12/05/2015 2033   PROTEINUR NEGATIVE 12/05/2015 2033   UROBILINOGEN 0.2 11/27/2010 1122   NITRITE NEGATIVE 12/05/2015 2033   LEUKOCYTESUR NEGATIVE 12/05/2015 2033   Sepsis Labs: @LABRCNTIP (procalcitonin:4,lacticidven:4)   )No results found for this or any previous visit (from the past 240 hour(s)).    Radiology Studies: No results found.      Scheduled Meds: . citalopram  20 mg Oral Daily  . dexamethasone  4 mg Oral BID  . flecainide  50 mg Oral Daily  . furosemide  40 mg Oral Daily  . gabapentin  300 mg Oral TID  . HYDROmorphone   Intravenous Q4H  . lidocaine  1 patch Transdermal Q24H  . multivitamin with minerals  1 tablet Oral Daily  . OLANZapine  5 mg Oral QHS  . polyethylene glycol  17 g Oral Daily  . rivaroxaban  20 mg Oral Daily  . senna  2 tablet Oral BID   Continuous Infusions: . sodium chloride 50 mL/hr at 03/27/17 0810  . ondansetron (ZOFRAN) IV       LOS: 8 days    Time spent: 25 minutes  Greater than 50% of the time spent on counseling and coordinating the care.   Leisa Lenz, MD [facility] Pager [phone]  If 7PM-7AM, please contact night-coverage [email or web address] Password [email or web address] 03/29/2017, 9:55 AM

## 2017-03-29 NOTE — Progress Notes (Signed)
[facility] Therapy Treatment Record Phone [phone]   Radiation Therapy was administered to Cindy Faulkner on: 03/29/2017  9:12 AM and was treatment # 6 out of a planned course of 10 treatments.  Radiation Treatment  1). Beam photons with 6-10 energy  2). Brachytherapy None  3). Stereotactic Radiosurgery None  4). Other Radiation None     Jacques Earthly, RT (T)

## 2017-03-29 NOTE — Progress Notes (Signed)
Daily Progress Note   Patient Name: Cindy Faulkner       Date: 03/29/2017 DOB: 02/08/1966  Age: 52 y.o. MRN#: [id number] Attending Physician: Robbie Lis, MD Primary Care Physician: Lennie Odor, PA-C Admit Date: 03/21/2017  Reason for Consultation/Follow-up: Non pain symptom management and Pain control  Subjective: I met today with [name] and her mother .  We reviewed her pain and she reports that it has been controlled over the last 24 hours.  Discussed plan for weaning of her PCA.    Reviewed changes to care plan in detail.  See below.  Length of Stay: 8  Current Medications: Scheduled Meds:  . citalopram  20 mg Oral Daily  . dexamethasone  4 mg Oral BID  . flecainide  50 mg Oral Daily  . furosemide  40 mg Oral Daily  . gabapentin  300 mg Oral TID  . HYDROmorphone   Intravenous Q4H  . lidocaine  1 patch Transdermal Q24H  . multivitamin with minerals  1 tablet Oral Daily  . OLANZapine  5 mg Oral QHS  . polyethylene glycol  17 g Oral Daily  . rivaroxaban  20 mg Oral Daily  . senna  2 tablet Oral BID    Continuous Infusions: . sodium chloride 50 mL/hr at 03/29/17 1812  . ondansetron (ZOFRAN) IV      PRN Meds: acetaminophen, ALPRAZolam, diphenhydrAMINE **OR** diphenhydrAMINE, fentaNYL (SUBLIMAZE) injection, naloxone **AND** sodium chloride flush, ondansetron **OR** ondansetron **OR** ondansetron (ZOFRAN) IV **OR** ondansetron (ZOFRAN) IV, sodium chloride flush  Physical Exam         General: Alert, awake, in no acute distress.  HEENT: No bruits, no goiter, no JVD Heart: Regular rate and rhythm. No murmur appreciated. Lungs: Good air movement, clear Abdomen: Soft, nontender, nondistended, positive bowel sounds.  Ext: No significant edema Skin: Warm and dry Neuro:  Grossly intact, nonfocal.   Vital Signs: BP 139/76 (BP Location: Left Arm)   Pulse 97   Temp 99.2 F (37.3 C) (Oral)   Resp 18   Ht _0  (1.702 m)   Wt 122.5 kg (270 lb 1 oz)   LMP 02/12/2012   SpO2 97%   BMI 42.30 kg/m  SpO2: SpO2: (on palliative care) O2 Device: O2 Device: Not Delivered O2 Flow Rate: O2 Flow Rate (L/min): (on  palliative care)  Intake/output summary:   Intake/Output Summary (Last 24 hours) at 03/29/2017 2030 Last data filed at 03/29/2017 1818 Gross per 24 hour  Intake 745 ml  Output -  Net 745 ml   LBM: Last BM Date: 03/29/17 Baseline Weight: Weight: 122.5 kg (270 lb) Most recent weight: Weight: 122.5 kg (270 lb 1 oz)       Palliative Assessment/Data:    Flowsheet Rows     Most Recent Value  Intake Tab  Referral Department  Hospitalist  Unit at Time of Referral  Oncology Unit  Palliative Care Primary Diagnosis  Cancer  Date Notified  03/21/17  Palliative Care Type  New Palliative care  Reason for referral  Pain  Date of Admission  03/21/17  Date first seen by Palliative Care  03/21/17  # of days Palliative referral response time  0 Day(s)  # of days IP prior to Palliative referral  0  Clinical Assessment  Palliative Performance Scale Score  40%  Pain Max last 24 hours  6  Pain Min Last 24 hours  4  Dyspnea Max Last 24 Hours  4  Dyspnea Min Last 24 hours  3  Nausea Max Last 24 Hours  6  Nausea Min Last 24 Hours  4  Psychosocial & Spiritual Assessment  Palliative Care Outcomes  Patient/Family meeting held?  Yes  Who was at the meeting?  patient, husband, daughter, niece, mother       Patient Active Problem List   Diagnosis Date Noted  . Metastasis to liver of unknown origin ([place]) 03/18/2017  . Dysuria 03/11/2017  . Herpes zoster 03/11/2017  . Cancer of unknown origin ([place]) 03/11/2017  . Metastasis to liver ([place]) 03/03/2017  . Sacral mass 03/02/2017  . Cancer associated pain 03/02/2017  . Mild single current episode of major  depressive disorder ([place]) 08/23/2016  . Cerebral thrombosis with cerebral infarction 12/05/2015  . Stroke (cerebrum) ([place]) 12/05/2015  . Obstructive sleep apnea 07/24/2015  . Morbid obesity ([place]) 03/22/2014  . Preoperative evaluation to rule out surgical contraindication 10/04/2011  . Chest pain, unspecified 06/24/2010  . Shortness of breath 06/24/2010  . Essential hypertension 10/20/2008  . RECTOCELE WITHOUT MENTION OF UTERINE PROLAPSE 10/20/2008  . ENDOMETRIAL POLYP 10/20/2008  . AF (paroxysmal atrial fibrillation) ([place]) 10/20/2008    Palliative Care Assessment & Plan   Patient Profile: 52 year old female with newly diagnosed metastatic squamous cell cancer  Assessment: Patient Active Problem List   Diagnosis Date Noted  . Metastasis to liver of unknown origin ([place]) 03/18/2017  . Dysuria 03/11/2017  . Herpes zoster 03/11/2017  . Cancer of unknown origin ([place]) 03/11/2017  . Metastasis to liver ([place]) 03/03/2017  . Sacral mass 03/02/2017  . Cancer associated pain 03/02/2017  . Mild single current episode of major depressive disorder ([place]) 08/23/2016  . Cerebral thrombosis with cerebral infarction 12/05/2015  . Stroke (cerebrum) ([place]) 12/05/2015  . Obstructive sleep apnea 07/24/2015  . Morbid obesity ([place]) 03/22/2014  . Preoperative evaluation to rule out surgical contraindication 10/04/2011  . Chest pain, unspecified 06/24/2010  . Shortness of breath 06/24/2010  . Essential hypertension 10/20/2008  . RECTOCELE WITHOUT MENTION OF UTERINE PROLAPSE 10/20/2008  . ENDOMETRIAL POLYP 10/20/2008  . AF (paroxysmal atrial fibrillation) ([place]) 10/20/2008    Recommendations/Plan:  Pain: Reports pain controlled today.  I reviewed her PCA and she has used 27 mg of Dilaudid in the last 24 hours.  We discussed plan for continuing to wean PCA in order to begin working toward  regimen for discharge. To remain on  Dilaudid to 0.75 mg for basal rate and PCA setting to 0.5 mg with 15-minute  lockout.  Consider interventional pain management (Dr.Paul Harkins) to eval if she does not continue to improve. Patient has been using her bolus doses frequently today due to pain in her L buttock area which she describes as sharp like a knife in her perineal area.    PRN fentanyl 50 mcg as needed in case her pain becomes uncontrolled again with radiation.  Constipation: Continue senna 2 tabs twice daily.  Continue MiraLAX.  Confusion: improving. Likely multifactorial.    Appreciate spiritual care facilitating completion of new advance directive paperwork.  Goals of Care and Additional Recommendations:  Limitations on Scope of Treatment: Full Scope Treatment  Patient is appreciative of surgery input, await further recommendations. Code Status:    Code Status Orders  (From admission, onward)        Start     Ordered   03/21/17 1029  Full code  Continuous     03/21/17 1031    Code Status History    Date Active Date Inactive Code Status Order ID Comments User Context   12/05/2015 23:46 12/06/2015 21:30 Full Code [id number]  Rise Patience, MD ED    Advance Directive Documentation     Most Recent Value  Type of Advance Directive  Healthcare Power of Attorney, Living will  Pre-existing out of facility DNR order (yellow form or pink MOST form)  No data  "MOST" Form in Place?  No data       Prognosis:   Unable to determine  Discharge Planning:  To Be Determined  Care plan was discussed with Patient and mother and RN.   Thank you for allowing the Palliative Medicine Team to assist in the care of this patient.   Total Time 35 Prolonged Time Billed No      Greater than 50%  of this time was spent counseling and coordinating care related to the above assessment and plan.  Loistine Chance, MD [phone]  Please contact Palliative Medicine Team phone at [phone] for questions and concerns.

## 2017-03-29 NOTE — Consult Note (Signed)
Reason for Consult:  Metastatic squamous cell carcinoma of uncertain etiology.  With metastasis to the sacrum and liver regions.  Chief complaint: Back pain  Referring Physician: Dr. Maurice Small Redmon, Barth Kirks, PA-C as PCP - General (Nurse Practitioner) Arvella Nigh, MD as Obstetrician (Obstetrics and Gynecology) Evans Lance, MD as Consulting Physician (Cardiology)   Cindy Faulkner is an 52 y.o. female.   HPI: Patient is well known to everyone at [facility].  She has been a Doctor, hospital here for many years.  She started having back pain back in January and was seen by orthopedic service.  She was found to have what was thought to be a metastatic cancer with a presacral mass on the left.  She has been seen by orthopedics, GYN and is currently being followed by Dr. Alvy Bimler from oncology.  Patient reports intermittently progressive and more intense/persistent pain, not relieved by Percocet and Dilaudid at home.  Mild urinary retention She presented to the ED for admission most recently on 03/21/17.  Patient's been seen and evaluated by GYN oncology, and has undergone biopsies in order to obtain possible origin of her cancer.  To date there is not a firm source of her primary cancer.  She has  metastasis to the sacral region and the liver.  Dr. Alvy Bimler has attempted to explain; that for end-stage for disease surgical resection of an unknown metastatic site was not recommended. The patient and family are asking about the role of possible surgical resection.  She is currently on a PCA pump for pain control.  She is being seen by Palliative Care Medicine, and Dr. Micheline Rough.  He is managing her pain medications currently.  She is also being treated for constipation and confusion.  Patient has also heard from Dr. Domingo Cocking that she is not a candidate for surgery but continues to ask for a second opinion.  Currently she has a left posterior pelvic mass extending into the obturator perforated  foramen edging up to 7cm suspicious for malignancy found on a CT 03/03/17.  She also has multifocal liver metastasis noted on CT the largest is 10 cm x 5.2 cm x 5.9 cm.  The gallbladder appeared normal.  CT of the lungs and chest was unremarkable.  There were no lytic bone lesions identified myeloma. 1. Endometrium, biopsy - PREDOMINANTLY MUCUS WITH RARE STRIPS OF BENIGN GLANDULAR EPITHELIUM AND STROMA 2. Cervix, biopsy, endocervix - BENIGN SQUAMOUS MUCOSA - NO DYSPLASIA OR MALIGNANCY IDENTIFIED 3. Cervix, biopsy, ectocervix - BENIGN SQUAMOUS MUCOSA - NO DYSPLASIA OR MALIGNANCY IDENTIFIED  She had a core liver lesion in the right lobe biopsy obtained on 1/16.  This showed metastatic squamous cell carcinoma.  Possible sites of origin including gynecologic, anal and lung, head and neck.  Metastatic carcinoma is immunoreactive for p40, p16, p53 and pancytokeratin while CK7, CK20, and TTF1 are negative. Significance of p16 positivity depends on the site of origin and the pattern of immunoreactivity in this material is non-specific.  MRI of the brain on 03/23/17, showed no intracranial metastasis, chronic microvascular ischemic changes and mild parenchymal volume loss of the brain.  Small chronic lacunar infarcts within genu of left internal capsule  She remains afebrile blood pressure is somewhat elevated.  Labs show an elevated WBC of 17,000.  Hemoglobin and hematocrit are stable at 9.7/30.  Platelets are normal.  Glucose was elevated at 251.  The remainder of the CMP is essentially normal.  HIV is nonreactive. She is on day 6  of radiation therapy, and she has 6 more treatments for this.  She is supposed to see Dr. Alvy Bimler next week to start chemotherapy.  What the patient would really like is be able to resect both the left pelvic tumor and the hepatic tumor.  We are asked to see for a second opinion.  Past Medical History:  Diagnosis Date  . Atrial fibrillation ([place])    a. echo 4/07: EF 60%  .  Atrial tachycardia ([place])    a. s/p EPS 4/09: no inducible SVT - med Tx continued  . Complication of anesthesia    age 64yo, woke during procedure with GA, paralysed   . Endometrial polyp   . Hyperlipidemia   . Hypertension   . PONV (postoperative nausea and vomiting)   . Rectocele    WITHOUT MENTION OF UTERINE PROLAPSE  . Stroke ([place])   . SVD (spontaneous vaginal delivery)    x 1    Past Surgical History:  Procedure Laterality Date  . COLONOSCOPY    . HYSTEROSCOPY    . IR FLUORO GUIDE PORT INSERTION RIGHT  03/23/2017  . IR US GUIDE VASC ACCESS RIGHT  03/23/2017  . LEEP    . Pylonidal cystect    . TUBAL LIGATION    . WISDOM TOOTH EXTRACTION      Family History  Problem Relation Age of Onset  . Hypertension Father   . Diabetes Father   . Stroke Father   . Hypertension Mother   . Stroke Mother   . Cancer Mother        cervical ca  . Cancer Sister 42       uterine ca  . Cancer Maternal Grandmother        lung ca    Social History:  reports that she quit smoking about 19 years ago. Her smoking use included cigarettes. She has a 5.00 pack-year smoking history. she has never used smokeless tobacco. She reports that she does not drink alcohol or use drugs.  Allergies: No Known Allergies  Medications:  Prior to Admission:  Medications Prior to Admission  Medication Sig Dispense Refill Last Dose  . acetaminophen (TYLENOL) 500 MG tablet Take 1,000 mg by mouth every 6 (six) hours as needed for mild pain.   03/21/2017 at Unknown time  . ALPRAZolam (XANAX) 0.25 MG tablet Take 0.25 mg by mouth 3 (three) times daily as needed for sleep.    03/20/2017 at Unknown time  . atorvastatin (LIPITOR) 40 MG tablet Take 1 tablet (40 mg total) by mouth daily at 6 PM. 30 tablet 0 03/20/2017 at Unknown time  . Biotin 1000 MCG tablet Take 1,000 mcg by mouth daily.   03/20/2017 at Unknown time  . cholecalciferol (VITAMIN D) 1000 units tablet Take 2,000 Units by mouth daily.   03/20/2017 at Unknown  time  . citalopram (CELEXA) 20 MG tablet Take 20 mg by mouth daily.   03/20/2017 at Unknown time  . flecainide (TAMBOCOR) 50 MG tablet Take 1 tablet (50 mg total) by mouth daily. 90 tablet 3 03/20/2017 at Unknown time  . furosemide (LASIX) 40 MG tablet Take 40 mg by mouth daily.   03/20/2017 at Unknown time  . gabapentin (NEURONTIN) 300 MG capsule Take 300 mg by mouth 3 (three) times daily.  1 03/21/2017 at Unknown time  . Glucosamine-Chondroitin (GLUCOSAMINE CHONDR COMPLEX PO) Take 2 capsules by mouth daily.    03/20/2017 at Unknown time  . HYDROmorphone (DILAUDID) 4 MG tablet Take 1 tablet (4  mg total) by mouth every 4 (four) hours as needed. 90 tablet 0 03/21/2017 at Unknown time  . metoprolol tartrate (LOPRESSOR) 50 MG tablet Take 1 tablet (50 mg total) by mouth daily. 90 tablet 3 03/20/2017 at 0800  . morphine (MS CONTIN) 60 MG 12 hr tablet Take 1 tablet (60 mg total) by mouth every 8 (eight) hours. 90 tablet 0 03/21/2017 at Unknown time  . Multiple Vitamin (MULTIVITAMIN) tablet Take 2 tablets by mouth daily.    03/20/2017 at Unknown time  . Omega-3 Fatty Acids (FISH OIL) 1000 MG CAPS Take 2 capsules by mouth daily.     03/20/2017 at Unknown time  . oxymetazoline (AFRIN) 0.05 % nasal spray Place 2 sprays into both nostrils 2 (two) times daily as needed for congestion.   Past Week at Unknown time  . rivaroxaban (XARELTO) 20 MG TABS tablet Take 1 tablet (20 mg total) by mouth daily. 30 tablet 0 03/20/2017 at 0800  . Turmeric (CURCUMIN 95) 500 MG CAPS Take 3 capsules by mouth daily.    03/20/2017 at Unknown time  . valACYclovir (VALTREX) 1000 MG tablet Take 1 tablet (1,000 mg total) by mouth 3 (three) times daily. 21 tablet 0 03/20/2017 at Unknown time  . [DISCONTINUED] ciprofloxacin (CIPRO) 250 MG tablet Take 1 tablet (250 mg total) by mouth 2 (two) times daily. (Patient not taking: Reported on 03/18/2017) 14 tablet 0 Not Taking   Scheduled: . citalopram  20 mg Oral Daily  . dexamethasone  4 mg Oral BID  .  flecainide  50 mg Oral Daily  . furosemide  40 mg Oral Daily  . gabapentin  300 mg Oral TID  . HYDROmorphone   Intravenous Q4H  . lidocaine  1 patch Transdermal Q24H  . multivitamin with minerals  1 tablet Oral Daily  . OLANZapine  5 mg Oral QHS  . polyethylene glycol  17 g Oral Daily  . rivaroxaban  20 mg Oral Daily  . senna  2 tablet Oral BID   Continuous: . sodium chloride 50 mL/hr at 03/27/17 0810  . ondansetron (ZOFRAN) IV     Anti-infectives (From admission, onward)   Start     Dose/Rate Route Frequency Ordered Stop   03/23/17 1400  ceFAZolin (ANCEF) 3 g in dextrose 5 % 50 mL IVPB     3 g 130 mL/hr over 30 Minutes Intravenous To Radiology 03/22/17 1458 03/23/17 1515      No results found for this or any previous visit (from the past 48 hour(s)).  No results found.  Review of Systems  Constitutional: Negative.  Negative for chills, diaphoresis, fever, malaise/fatigue and weight loss.       She actually gained weight starting about 2 years ago after stroke with depression.  On Celexa.  No change since onset of symptoms first noticed in Nov 2018.    HENT: Negative.   Eyes: Negative.   Respiratory: Positive for shortness of breath. Negative for cough, hemoptysis, sputum production and wheezing.        She has sleep apnea and uses a CPAP machine.  This is been ongoing for the last 2 years.  She does notice some dyspnea on exertion currently with walking up an incline.  Cardiovascular: Positive for palpitations (She had some PAF, occasional PAC, she can tell when she is in AF, none for some years.). Negative for orthopnea, claudication, leg swelling and PND.  Gastrointestinal: Positive for constipation and heartburn (Occasionally). Negative for abdominal pain, blood in stool, diarrhea, melena,  nausea and vomiting.  Genitourinary:       She has had trouble at times with voiding.  This seems to be better now.  Musculoskeletal: Positive for back pain.  Skin: Negative.    Neurological: Negative for dizziness, tingling, tremors, sensory change, speech change, focal weakness, seizures, loss of consciousness and headaches.       She notes some memory issues especially short-term much of which she is attributing to her pain medications.  Endo/Heme/Allergies: Negative.   Psychiatric/Behavioral: Positive for depression.   Blood pressure 139/76, pulse 97, temperature 99.2 F (37.3 C), temperature source Oral, resp. rate 18, height _0  (1.702 m), weight 122.5 kg (270 lb 1 oz), last menstrual period 02/12/2012, SpO2 97 %. Physical Exam  Constitutional: She is oriented to person, place, and time. She appears well-developed and well-nourished. No distress.  Body mass index is 42.3 kg/m.   HENT:  Head: Normocephalic and atraumatic.  Mouth/Throat: Oropharynx is clear and moist. No oropharyngeal exudate.  Eyes: Conjunctivae and EOM are normal. Right eye exhibits no discharge. Left eye exhibits no discharge. No scleral icterus.  Pupils are equal  Neck: Normal range of motion. Neck supple. No JVD present. No tracheal deviation present. No thyromegaly present.  Cardiovascular: Normal rate, regular rhythm, normal heart sounds and intact distal pulses.  No murmur heard. Respiratory: Effort normal and breath sounds normal. No respiratory distress. She has no wheezes. She has no rales. She exhibits no tenderness.  GI: Soft. Bowel sounds are normal. She exhibits no distension and no mass. There is no tenderness. There is no rebound and no guarding.  Musculoskeletal: She exhibits no edema or tenderness.  Lymphadenopathy:    She has no cervical adenopathy.  Neurological: She is alert and oriented to person, place, and time. No cranial nerve deficit.  Skin: Skin is warm and dry. No rash noted. She is not diaphoretic. No erythema. No pallor.  Psychiatric: She has a normal mood and affect. Her behavior is normal. Judgment and thought content normal.     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  Etiology unknown. History of atrial tachycardia -previously on Xarelto History of CVA without residual currently. Sleep apnea with CPAP Hypertension Hyperlipidemia BMI 42  Plan: This is obviously a very complicated issue.  A pathology diagnosis of the tumor origin is still pending.  Patient is currently in the middle of radiation therapy, and hospitalized for ongoing pain control; still on PCA.  She is to initiate chemotherapy next week.    We are asked to see about possible resection of both the pelvic and liver metastasis.  Dr. Johney Maine will review this with Dr. Barry Dienes.  This may also be reviewed by the tumor board.  Currently Dr. Alvy Bimler and Dr. Kirstie Mirza opinion would predominate.  Clifford Benninger 03/29/2017, 2:08 PM

## 2017-03-30 ENCOUNTER — Ambulatory Visit
Admit: 2017-03-30 | Discharge: 2017-03-30 | Disposition: A | Discharge status: Home / Self Care | Payer: 59 | Attending: Radiation Oncology | Admitting: Radiation Oncology

## 2017-03-30 DIAGNOSIS — C787 Secondary malignant neoplasm of liver and intrahepatic bile duct: Secondary | ICD-10-CM

## 2017-03-30 DIAGNOSIS — C801 Malignant (primary) neoplasm, unspecified: Secondary | ICD-10-CM

## 2017-03-30 MED ORDER — SILVER SULFADIAZINE 1 % EX CREA
TOPICAL_CREAM | Freq: Two times a day (BID) | CUTANEOUS | Status: DC
  Administered 2017-03-31 – 2017-04-01 (×3): via TOPICAL
  Filled 2017-03-30: qty 50

## 2017-03-30 MED ORDER — HYDROMORPHONE 1 MG/ML IV SOLN
INTRAVENOUS | Status: DC
  Administered 2017-03-30: 6.17 mg via INTRAVENOUS
  Administered 2017-03-30: 6.69 mg via INTRAVENOUS
  Administered 2017-03-31: 0 mg via INTRAVENOUS
  Administered 2017-03-31: 4.78 mg via INTRAVENOUS
  Administered 2017-03-31: 4.48 mg via INTRAVENOUS
  Administered 2017-03-31: 04:00:00 via INTRAVENOUS
  Administered 2017-03-31: 5.53 mg via INTRAVENOUS
  Filled 2017-03-30: qty 25

## 2017-03-30 NOTE — Progress Notes (Signed)
Nutrition Follow-up  DOCUMENTATION CODES:   Morbid obesity  INTERVENTION:   Will continue to monitor for nutritional needs.  NUTRITION DIAGNOSIS:   Increased nutrient needs related to cancer and cancer related treatments as evidenced by estimated needs.  Ongoing.  GOAL:   Patient will meet greater than or equal to 90% of their needs  Meeting, eating well.  MONITOR:   PO intake, Weight trends, Labs, I & O's  ASSESSMENT:    52 y.o. female is admitted for management of severe, uncontrolled cancer pain. Pt with metastatic cancer of unknown origin.  Pt continues to eat very well. PO intakes of 100% across the board. No weight loss during this admission. No need identified at this time.   Pt receiving radiation therapy and has plans to start chemotherapy soon per MD notes.  Labs reviewed. Medications: Decadron tablet BID, Lasix tablet daily, Multivitamin with minerals daily, Miralax packet daily, Senokot tablet BID   Diet Order:  Diet regular Room service appropriate? Yes; Fluid consistency: Thin  EDUCATION NEEDS:   Not appropriate for education at this time  Skin:  Skin Assessment: Reviewed RN Assessment  Last BM:  2/5  Height:   Ht Readings from Last 1 Encounters:  03/21/17 5\' 7"  (1.702 m)    Weight:   Wt Readings from Last 1 Encounters:  03/22/17 270 lb 1 oz (122.5 kg)    Ideal Body Weight:  61.4 kg  BMI:  Body mass index is 42.3 kg/m.  Estimated Nutritional Needs:   Kcal:  1638-4536  Protein:  85-95g  Fluid:  2.3L/day  Clayton Bibles, MS, RD, LDN [place] Dietitian Pager: [phone] After Hours Pager: [phone]

## 2017-03-30 NOTE — Progress Notes (Addendum)
I met pt in the hallway as she was walking and told me why she is a pt. Pt's husband, mother and a couple staff members were bedside of pt when I arrived to her room.  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 She is concerned that there are questions yet to be answered regarding the origin of her cancer. Pt's family and I talked about her faith and I learned pt and her husband enjoy singing. Pt's husband is also a Company secretary. Pt appreciated the visit and prayer. Please page if additional support is needed as pt continues to learn more about her cancer, managing her pain and plan of care moving forward. [place], MDiv   03/30/17 0900  Clinical Encounter Type  Visited With Patient and family together

## 2017-03-30 NOTE — Progress Notes (Signed)
[facility]  [address] [place], [place] [address] Phone: [phone]  FAX: [phone]      Cindy Faulkner [id number] Feb 09, 1966  CARE TEAM:  PCP: Lennie Odor, PA-C  Outpatient Care Team: Patient Care Team: Cleda Mccreedy as PCP - General (Nurse Practitioner) Arvella Nigh, MD as Obstetrician (Obstetrics and Gynecology) Evans Lance, MD as Consulting Physician (Cardiology) Serena Colonel, RN as [facility]  Inpatient Treatment Team: Treatment Team: Attending Provider: Marene Lenz, MD; Student Nurse: Larina Bras, Student-RN; Registered Nurse: Terrial Rhodes, RN; Attending Physician: [name], MD; Attending Physician: Robbie Lis, MD; Rounding Team: Joycelyn Das, MD; Consulting Physician: Nolon Nations, MD; Consulting Physician: Stark Klein, MD; Technician: Lucas Mallow, NT   Problem List:   Principal Problem:   Metastatic squamous cell carcinoma involving liver & sacrum with unknown primary site  Active Problems:   Obstructive sleep apnea   Sacral mass   Cancer associated pain   Metastasis to liver ([place])   Cancer of unknown origin ([place])   Metastasis to liver of unknown origin ([place])      * No surgery found *     Assessment  Metastatic squamous cell carcinoma with unknown primary.  Metastases to both lobes of liver and left obturator foramens/sacrum.  Plan:  We discussed her case at GI tumor board with my colleagues in surgical oncology/HBP surgery (Dr. Barry Dienes) as well as colorectal surgery (Dr. Marcello Moores and Dr. Dema Severin).  Discussed with radiation and medical oncology.  Radiology, genetics, etc.  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  Unfortunate situation  I do not see any appropriate options for surgical intervention at this time.  She has  metastatic disease in multiple locations.  She would be best treated with systemic chemotherapy to help control disease and spread.  Focal radiation or other interventions of the sacral/obturator region.  To resect this at this point would require hip disarticulation.  Consultation with orthopedic oncology as well as colorectal surgery and gynecologic surgery.  Outside the comfort level of my colorectal colleagues and myself.  Not felt to be appropriate for gynecological oncologic intervention according to Dr. Denman George.  It is reasonable to consider a second opinion at some point, but I would continue with the plan done by medical and radiation oncology.  Give this a chance to help see if there is response and control of disease.  Prognosis guarded but she is optimistic and feeling much better with the steroids and radiation to help palliate against the severe pelvic pain.  Let us give "Plan A" a shot with close follow-up before looking at plans B, C, D, etc.  It may come to that given the unusual nature and significant tumor burden.  But surgery first would not improve survival and would delay chemoradiation therapy, affecting prognosis.  I strongly suspect that medical & radiation oncology have discussed with their regional & national colleagues as well.  Liver resection not a good idea as she has bilobar disease.  That is not the most pressing issue right now compared to her large pelvic mass.  Could consider revisiting this if other areas resolved or treated, but would hold off for now.  Reasonable to update and revisit in the future  -VTE prophylaxis- SCDs, etc -mobilize as tolerated to help recovery  90 minutes spent in review, evaluation, examination, counseling, and coordination of care.  More than 50% of that time was spent in counseling.  I updated the patient's status to the patient and spouse.  Recommendations were made.  Questions were answered.  They expressed understanding &  appreciation.   Adin Hector, M.D., F.A.C.S. Gastrointestinal and Minimally Invasive Surgery [facility] [address], [place], [place] [address] [phone] Main / Paging   03/30/2017    Subjective: (Chief complaint)  Left leg tires with walking but pain much less with the help of steroids and radiation and PCA.  Trying to transition to longer acting oral Dilaudid with intermittent Dilaudid medication since other narcotics not going well  Objective:  Vital signs:  Vitals:   03/29/17 1359 03/29/17 2056 03/30/17 0515 03/30/17 1306  BP: 139/76 126/69 (!) 153/78 124/73  Pulse: 97 60 62 72  Resp: 18 18 18 16   Temp: 99.2 F (37.3 C) 98 F (36.7 C) 97.8 F (36.6 C) 98.6 F (37 C)  TempSrc: Oral Oral Oral Oral  SpO2: 97% 97% 96% 96%  Weight:      Height:        Last BM Date: 03/29/17  Intake/Output   Yesterday:  02/05 0701 - 02/06 0700 In: 720 [P.O.:720] Out: -  This shift:  No intake/output data recorded.  Bowel function:  Flatus: YES  BM:  YES  Drain: (No drain)   Physical Exam:  General: Pt awake/alert/oriented x4 in no acute distress Eyes: PERRL, normal EOM.  Sclera clear.  No icterus Neuro: CN II-XII intact w/o focal sensory/motor deficits. Lymph: No head/neck/groin lymphadenopathy Psych:  No delerium/psychosis/paranoia HENT: Normocephalic, Mucus membranes moist.  No thrush Neck: Supple, No tracheal deviation Chest: No chest wall pain w good excursion CV:  Pulses intact.  Regular rhythm MS: Normal AROM mjr joints.  No obvious deformity  Abdomen: Soft.  Nondistended.  Nontender.  No evidence of peritonitis.  No incarcerated hernias.  Ext:  No deformity.  No mjr edema.  No cyanosis Skin: No petechiae / purpura  Results:   Labs: No results found for this or any previous visit (from the past 48 hour(s)).  Imaging / Studies: No results found.  Medications / Allergies: per  chart  Antibiotics: Anti-infectives (From admission, onward)   Start     Dose/Rate Route Frequency Ordered Stop   03/23/17 1400  ceFAZolin (ANCEF) 3 g in dextrose 5 % 50 mL IVPB     3 g 130 mL/hr over 30 Minutes Intravenous To Radiology 03/22/17 1458 03/23/17 1515        Note: Portions of this report may have been transcribed using voice recognition software. Every effort was made to ensure accuracy; however, inadvertent computerized transcription errors may be present.   Any transcriptional errors that result from this process are unintentional.     Adin Hector, M.D., F.A.C.S. Gastrointestinal and Minimally Invasive Surgery [facility] [address] [place], [place] [address] [phone] Main / Paging   03/30/2017

## 2017-03-30 NOTE — Progress Notes (Signed)
Physical Therapy Treatment Patient Details Name: Cindy Faulkner MRN: [id number] DOB: [birth year] Today's Date: 03/30/2017    History of Present Illness 52 yo female Metastatic squamous cell carcinoma of unknown origin, with metastatic disease to the sacral region and liver, history of stroke left internal capsule stroke    PT Comments    Pt highly motivated and is able to get herself to and from bathroom pushing her own IV pole.  Assisted with amb in hallway, pt shows improvement with her her L LE.  Pt present with increased control/steppage with no "stagger" as described 5 days ago.  Pt also present with improved Dorsi Flexion (3+/5) with initial gait but with increased distance becomes more prevalent Dorsi Flexion (2/5) 2nd fatigue.  Pt did not require the use of any AD, carried cane for trial and occasional need to steady self with rail/wall.  Pt has such a quick gait, advised to decrease gait speed to increase control and safety. Pt also reports improved pain.  Currently weaning from PCA to oral.  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  MD notified.     Follow Up Recommendations  No PT follow up     Equipment Recommendations  Cane    Recommendations for Other Services       Precautions / Restrictions Precautions Precautions: Fall Restrictions Weight Bearing Restrictions: No    Mobility  Bed Mobility Overal bed mobility: Independent                Transfers Overall transfer level: Independent Equipment used: None Transfers: Sit to/from Stand Sit to Stand: Supervision         General transfer comment: patient moves quickly, cues for safety  Ambulation/Gait Ambulation/Gait assistance: Supervision;Min guard Ambulation Distance (Feet): 350 Feet Assistive device: Straight cane;None Gait Pattern/deviations: Step-through pattern;Decreased dorsiflexion - left;Steppage Gait velocity: WFL    General Gait Details: initially amb without need for any AD (carried cane for trial) good alternating gait with equal step length and stance time but with increased distance came increased fatigue.  After 300 feet pt started to demonstarte increased L dorsi flex and increased toe slap compensating by hip/knee hike.     Stairs            Wheelchair Mobility    Modified Rankin (Stroke Patients Only)       Balance                                            Cognition Arousal/Alertness: Awake/alert Behavior During Therapy: WFL for tasks assessed/performed Overall Cognitive Status: Within Functional Limits for tasks assessed                                 General Comments: highly motivated      Exercises      General Comments        Pertinent Vitals/Pain Pain Assessment: Faces Faces Pain Scale: Hurts a little bit Pain Location: left sacral, sciatic area "improved" Pain Descriptors / Indicators: Discomfort Pain Intervention(s): Monitored during session    Home Living                      Prior Function  PT Goals (current goals can now be found in the care plan section) Progress towards PT goals: Progressing toward goals    Frequency    Min 3X/week      PT Plan Current plan remains appropriate    Co-evaluation              AM-PAC PT "6 Clicks" Daily Activity  Outcome Measure  Difficulty turning over in bed (including adjusting bedclothes, sheets and blankets)?: None Difficulty moving from lying on back to sitting on the side of the bed? : None Difficulty sitting down on and standing up from a chair with arms (e.g., wheelchair, bedside commode, etc,.)?: A Little Help needed moving to and from a bed to chair (including a wheelchair)?: A Little Help needed walking in hospital room?: A Little Help needed climbing 3-5 steps with a railing? : A Little 6 Click Score: 20    End of Session  Equipment Utilized During Treatment: Gait belt Activity Tolerance: Patient tolerated treatment well Patient left: in bed;with call bell/phone within reach Nurse Communication: Mobility status PT Visit Diagnosis: Unsteadiness on feet (R26.81);Muscle weakness (generalized) (M62.81)     Time: 2244-9753 PT Time Calculation (min) (ACUTE ONLY): 14 min  Charges:  $Gait Training: 8-22 mins                    G Codes:       Rica Koyanagi  PTA [facility]  Acute  Rehab Pager      [phone]

## 2017-03-30 NOTE — Progress Notes (Addendum)
PROGRESS NOTE    Cindy Faulkner  XIP:[id number] DOB: 1965-08-27 DOA: 03/21/2017 PCP: Lennie Odor, PA-C   Brief Narrative: Patient is a 52 year old female with metastatic squamous cell carcinoma from unknown origin with metastases to sacrum and liver region.  She was admitted for pain management.  Oncology following her.  She is undergoing radiation therapy. Palliative care is also following  Assessment & Plan:   Principal Problem:   Metastatic squamous cell carcinoma involving liver & sacrum with unknown primary site  Active Problems:   Obstructive sleep apnea   Sacral mass   Cancer associated pain   Metastasis to liver ([facility])   Cancer of unknown origin ([place])   Metastasis to liver of unknown origin ([place])  Squamous cell carcinoma of unknown origin/sacral mass/metastatic liver lesions:  Oncology following.  Palliative care following. Continue pain management. She is undergoing radiation therapy.  Started post 5 sessions.  3 sessions remaining.This can also be done as an outpatient. Planning for outpatient chemotherapy to start next week General surgery was also consulted for possible role of surgical resection of the metastatic lesions.  Atrial fibrillation:CHADS vasc score 4 On AC with xarelto Rate controlled with flecainide   CVA: On Xarelto.  Constipation: resolved.  Intermittent confusion: Resolved. MRI excluded brain metastasis  Weakness/gait instability: Physical therapy following her  Leucocytosis: Most likely secondary to dexamethasone.  We will continue to monitor the trend   DVT prophylaxis: xarelto Code Status: Full Family Communication: Daughter present at the bed side Disposition Plan: Home in 2-3 days   Consultants: Oncology, palliative care  Procedures: None  Antimicrobials: None  Subjective:  Patient seen and examined the bedside this morning.  Remains comfortable.  Pain has improved.  She had concern about ruptured blister on her right  buttock.  Wound care consulted Objective: Vitals:   03/29/17 1359 03/29/17 2056 03/30/17 0515 03/30/17 1306  BP: 139/76 126/69 (!) 153/78 124/73  Pulse: 97 60 62 72  Resp: 18 18 18 16   Temp: 99.2 F (37.3 C) 98 F (36.7 C) 97.8 F (36.6 C) 98.6 F (37 C)  TempSrc: Oral Oral Oral Oral  SpO2: 97% 97% 96% 96%  Weight:      Height:        Intake/Output Summary (Last 24 hours) at 03/30/2017 1554 Last data filed at 03/30/2017 1306 Gross per 24 hour  Intake 720 ml  Output 5 ml  Net 715 ml   Filed Weights   03/21/17 1040 03/22/17 0503  Weight: 122.5 kg (270 lb) 122.5 kg (270 lb 1 oz)    Examination:  General exam: Appears calm and comfortable ,Not in distress,average built Respiratory system: Bilateral equal air entry, normal vesicular breath sounds, no wheezes or crackles  Cardiovascular system: S1 & S2 heard, RRR. No JVD, murmurs, rubs, gallops or clicks. No pedal edema. Gastrointestinal system: Abdomen is nondistended, soft and nontender. No organomegaly or masses felt. Normal bowel sounds heard. Central nervous system: Alert and oriented. No focal neurological deficits. Extremities: No edema, no clubbing ,no cyanosis, distal peripheral pulses palpable. Skin: No rashes, lesions or ulcers,no icterus ,no pallor Psychiatry: Judgement and insight appear normal. Mood & affect appropriate.     Data Reviewed: I have personally reviewed following labs and imaging studies  CBC: No results for input(s): WBC, NEUTROABS, HGB, HCT, MCV, PLT in the last 168 hours. Basic Metabolic Panel: No results for input(s): NA, K, CL, CO2, GLUCOSE, BUN, CREATININE, CALCIUM, MG, PHOS in the last 168 hours. GFR: Estimated Creatinine Clearance: 110.4  mL/min (by C-G formula based on SCr of 0.48 mg/dL). Liver Function Tests: No results for input(s): AST, ALT, ALKPHOS, BILITOT, PROT, ALBUMIN in the last 168 hours. No results for input(s): LIPASE, AMYLASE in the last 168 hours. No results for input(s):  AMMONIA in the last 168 hours. Coagulation Profile: No results for input(s): INR, PROTIME in the last 168 hours. Cardiac Enzymes: No results for input(s): CKTOTAL, CKMB, CKMBINDEX, TROPONINI in the last 168 hours. BNP (last 3 results) No results for input(s): PROBNP in the last 8760 hours. HbA1C: No results for input(s): HGBA1C in the last 72 hours. CBG: No results for input(s): GLUCAP in the last 168 hours. Lipid Profile: No results for input(s): CHOL, HDL, LDLCALC, TRIG, CHOLHDL, LDLDIRECT in the last 72 hours. Thyroid Function Tests: No results for input(s): TSH, T4TOTAL, FREET4, T3FREE, THYROIDAB in the last 72 hours. Anemia Panel: No results for input(s): VITAMINB12, FOLATE, FERRITIN, TIBC, IRON, RETICCTPCT in the last 72 hours. Sepsis Labs: No results for input(s): PROCALCITON, LATICACIDVEN in the last 168 hours.  No results found for this or any previous visit (from the past 240 hour(s)).       Radiology Studies: No results found.      Scheduled Meds: . citalopram  20 mg Oral Daily  . dexamethasone  4 mg Oral BID  . flecainide  50 mg Oral Daily  . furosemide  40 mg Oral Daily  . gabapentin  300 mg Oral TID  . HYDROmorphone   Intravenous Q4H  . lidocaine  1 patch Transdermal Q24H  . multivitamin with minerals  1 tablet Oral Daily  . OLANZapine  5 mg Oral QHS  . polyethylene glycol  17 g Oral Daily  . rivaroxaban  20 mg Oral Daily  . senna  2 tablet Oral BID  . silver sulfADIAZINE   Topical BID   Continuous Infusions: . sodium chloride 50 mL/hr at 03/30/17 1232  . ondansetron (ZOFRAN) IV       LOS: 9 days    Time spent:     Marene Lenz, MD [facility] Pager [phone]  If 7PM-7AM, please contact night-coverage [email or web address] Password [facility] 03/30/2017, 3:54 PM

## 2017-03-30 NOTE — Progress Notes (Signed)
Daily Progress Note   Patient Name: Cindy Faulkner       Date: 03/30/2017 DOB: 17-Mar-1965  Age: 52 y.o. MRN#: [id number] Attending Physician: Marene Lenz, MD Primary Care Physician: Lennie Odor, PA-C Admit Date: 03/21/2017  Reason for Consultation/Follow-up: Non pain symptom management and Pain control  Subjective: I met today with [name] and her daughter  We reviewed her pain and she reports that it has been controlled over the last 24 hours.  Discussed plan for weaning of her PCA.    Reviewed changes to care plan in detail.  See below.  Length of Stay: 9  Current Medications: Scheduled Meds:  . citalopram  20 mg Oral Daily  . dexamethasone  4 mg Oral BID  . flecainide  50 mg Oral Daily  . furosemide  40 mg Oral Daily  . gabapentin  300 mg Oral TID  . HYDROmorphone   Intravenous Q4H  . lidocaine  1 patch Transdermal Q24H  . multivitamin with minerals  1 tablet Oral Daily  . OLANZapine  5 mg Oral QHS  . polyethylene glycol  17 g Oral Daily  . rivaroxaban  20 mg Oral Daily  . senna  2 tablet Oral BID    Continuous Infusions: . sodium chloride 50 mL/hr at 03/29/17 1812  . ondansetron (ZOFRAN) IV      PRN Meds: acetaminophen, ALPRAZolam, diphenhydrAMINE **OR** diphenhydrAMINE, fentaNYL (SUBLIMAZE) injection, naloxone **AND** sodium chloride flush, ondansetron **OR** ondansetron **OR** ondansetron (ZOFRAN) IV **OR** ondansetron (ZOFRAN) IV, sodium chloride flush  Physical Exam         General: Alert, awake, in no acute distress.  HEENT: No bruits, no goiter, no JVD Heart: Regular rate and rhythm. No murmur appreciated. Lungs: Good air movement, clear Abdomen: Soft, nontender, nondistended, positive bowel sounds.  Ext: No significant edema Skin: Warm and  dry Neuro: Grossly intact, nonfocal.   Vital Signs: BP (!) 153/78 (BP Location: Left Arm)   Pulse 62   Temp 97.8 F (36.6 C) (Oral)   Resp 18   Ht _0  (1.702 m)   Wt 122.5 kg (270 lb 1 oz)   LMP 02/12/2012   SpO2 96%   BMI 42.30 kg/m  SpO2: SpO2: 96 % O2 Device: O2 Device: Not Delivered O2 Flow Rate: O2 Flow Rate (L/min): (on palliative  care)  Intake/output summary:   Intake/Output Summary (Last 24 hours) at 03/30/2017 1200 Last data filed at 03/29/2017 1818 Gross per 24 hour  Intake 480 ml  Output -  Net 480 ml   LBM: Last BM Date: 03/29/17 Baseline Weight: Weight: 122.5 kg (270 lb) Most recent weight: Weight: 122.5 kg (270 lb 1 oz)       Palliative Assessment/Data: PPS 50%   Flowsheet Rows     Most Recent Value  Intake Tab  Referral Department  Hospitalist  Unit at Time of Referral  Oncology Unit  Palliative Care Primary Diagnosis  Cancer  Date Notified  03/21/17  Palliative Care Type  New Palliative care  Reason for referral  Pain  Date of Admission  03/21/17  Date first seen by Palliative Care  03/21/17  # of days Palliative referral response time  0 Day(s)  # of days IP prior to Palliative referral  0  Clinical Assessment  Palliative Performance Scale Score  40%  Pain Max last 24 hours  6  Pain Min Last 24 hours  4  Dyspnea Max Last 24 Hours  4  Dyspnea Min Last 24 hours  3  Nausea Max Last 24 Hours  6  Nausea Min Last 24 Hours  4  Psychosocial & Spiritual Assessment  Palliative Care Outcomes  Patient/Family meeting held?  Yes  Who was at the meeting?  patient, husband, daughter, niece, mother       Patient Active Problem List   Diagnosis Date Noted  . Metastatic squamous cell carcinoma involving liver & sacrum with unknown primary site  03/30/2017  . Metastasis to liver of unknown origin ([place]) 03/18/2017  . Dysuria 03/11/2017  . Herpes zoster 03/11/2017  . Cancer of unknown origin ([place]) 03/11/2017  . Metastasis to liver ([place]) 03/03/2017  .  Sacral mass 03/02/2017  . Cancer associated pain 03/02/2017  . Mild single current episode of major depressive disorder ([place]) 08/23/2016  . Cerebral thrombosis with cerebral infarction 12/05/2015  . Stroke (cerebrum) ([place]) 12/05/2015  . Obstructive sleep apnea 07/24/2015  . Preoperative evaluation to rule out surgical contraindication 10/04/2011  . Chest pain, unspecified 06/24/2010  . Shortness of breath 06/24/2010  . Essential hypertension 10/20/2008  . RECTOCELE WITHOUT MENTION OF UTERINE PROLAPSE 10/20/2008  . ENDOMETRIAL POLYP 10/20/2008  . AF (paroxysmal atrial fibrillation) ([place]) 10/20/2008    Palliative Care Assessment & Plan   Patient Profile: 52 year old female with newly diagnosed metastatic squamous cell cancer  Assessment: Patient Active Problem List   Diagnosis Date Noted  . Metastatic squamous cell carcinoma involving liver & sacrum with unknown primary site  03/30/2017  . Metastasis to liver of unknown origin ([place]) 03/18/2017  . Dysuria 03/11/2017  . Herpes zoster 03/11/2017  . Cancer of unknown origin ([place]) 03/11/2017  . Metastasis to liver ([place]) 03/03/2017  . Sacral mass 03/02/2017  . Cancer associated pain 03/02/2017  . Mild single current episode of major depressive disorder ([place]) 08/23/2016  . Cerebral thrombosis with cerebral infarction 12/05/2015  . Stroke (cerebrum) ([place]) 12/05/2015  . Obstructive sleep apnea 07/24/2015  . Preoperative evaluation to rule out surgical contraindication 10/04/2011  . Chest pain, unspecified 06/24/2010  . Shortness of breath 06/24/2010  . Essential hypertension 10/20/2008  . RECTOCELE WITHOUT MENTION OF UTERINE PROLAPSE 10/20/2008  . ENDOMETRIAL POLYP 10/20/2008  . AF (paroxysmal atrial fibrillation) ([place]) 10/20/2008    Recommendations/Plan:  Pain: Reports pain controlled today. Wean basal rate on PCA to 0.5 mg basal rate, continue current bolus  rate 0.5 mg with 15 minute lock out. Eventually, will transition her to  PO OxyContin long acting+ PO Dilaudid PRN.   Try donut cushion in effort to alleviate sacral pressure/sharp pain patient is feeling at times.     Patient has been using her bolus doses due to pain in her L buttock area which she describes as sharp like a knife in her perineal area.    PRN fentanyl 50 mcg as needed in case her pain becomes uncontrolled again with radiation.  Constipation: Continue senna 2 tabs twice daily.  Continue MiraLAX.  Confusion: improving. Likely multifactorial.    Appreciate spiritual care facilitating completion of new advance directive paperwork.  Goals of Care and Additional Recommendations:  Limitations on Scope of Treatment: Full Scope Treatment  Patient is appreciative of surgery input, await further recommendations. Code Status:    Code Status Orders  (From admission, onward)        Start     Ordered   03/21/17 1029  Full code  Continuous     03/21/17 1031    Code Status History    Date Active Date Inactive Code Status Order ID Comments User Context   12/05/2015 23:46 12/06/2015 21:30 Full Code [id number]  Rise Patience, MD ED    Advance Directive Documentation     Most Recent Value  Type of Advance Directive  Healthcare Power of Attorney, Living will  Pre-existing out of facility DNR order (yellow form or pink MOST form)  No data  "MOST" Form in Place?  No data       Prognosis:   Unable to determine  Discharge Planning:  Home within the next few days.   Care plan was discussed with Patient and daughter and Dr Alvy Bimler.    Thank you for allowing the Palliative Medicine Team to assist in the care of this patient.   Total Time 35 Prolonged Time Billed No      Greater than 50%  of this time was spent counseling and coordinating care related to the above assessment and plan.  Loistine Chance, MD [phone]  Please contact Palliative Medicine Team phone at [phone] for questions and concerns.

## 2017-03-30 NOTE — Progress Notes (Signed)
Cindy Faulkner [facility]   DOB:1965/12/10   ZO#:[id number]    Subjective: Pain control is stable at around 4 out of 10.  I spoke with the palliative care service with plan to transition her to long-acting oral pain medicine.  She continues to have significant pressure sensation over the left sacral area She denies recent hallucination  Objective:  Vitals:   03/29/17 2056 03/30/17 0515  BP: 126/69 (!) 153/78  Pulse: 60 62  Resp: 18 18  Temp: 98 F (36.7 C) 97.8 F (36.6 C)  SpO2: 97% 96%     Intake/Output Summary (Last 24 hours) at 03/30/2017 1459 Last data filed at 03/29/2017 1818 Gross per 24 hour  Intake 240 ml  Output -  Net 240 ml    GENERAL:alert, no distress and comfortable SKIN: skin color, texture, turgor are normal, no rashes or significant lesions EYES: normal, Conjunctiva are pink and non-injected, sclera clear Musculoskeletal:no cyanosis of digits and no clubbing  NEURO: alert & oriented x 3 with fluent speech, no focal motor/sensory deficits   Labs:  Lab Results  Component Value Date   WBC 17.0 (H) 03/23/2017   HGB 9.7 (L) 03/23/2017   HCT 30.4 (L) 03/23/2017   MCV 84.2 03/23/2017   PLT 650 (H) 03/23/2017   NEUTROABS 13.9 (H) 03/23/2017    Lab Results  Component Value Date   NA 135 03/22/2017   K 4.1 03/22/2017   CL 98 (L) 03/22/2017   CO2 29 03/22/2017    Assessment & Plan:  Metastatic squamous cell carcinoma of unknown origin, with metastatic disease to the sacral region and liver Awaiting general surgery consult for discussion about the role of surgical resection Planning outpatient chemotherapy to start next week  Severe, uncontrolled cancer pain Pain control has improved. I appreciate palliative care service to assist in pain management.  Opiate-induced constipation, resolved Continue regular laxatives  DVT prophylaxis On Xarelto  Intermittent confusion, resolved Likely induced by medication MRI imaging of her brain excludedbrain  metastasis  CODE STATUS Full code  Weakness and gait instability ContinuePT for assessment and evaluation while she is hospitalized  Goals of care discussion The primary goals of care is to get her pain well controlled in this hospitalization.She has failed outpatient therapy  Discharge planning I am hopeful she can be discharged within the next few days and to complete her final treatment next week in the outpatient setting   [name], MD 03/30/2017  2:59 PM

## 2017-03-30 NOTE — Consult Note (Addendum)
[place] Nurse wound consult note Reason for Consult: ruptured blister right buttock from heating pad Wound type:thermal Pressure Injury POA: NA Measurement: 3.5cm x 2cm x 0.1cm  Wound bed: 100% yellow Drainage (amount, consistency, odor) none Periwound: intact Dressing procedure/placement/frequency: Please note, order for consult stated for pressure ulcer, this wound is from a heating pad burn, unrelated to pressure. I have provided nurses with orders for Cleanse with NS, pat dry, apply nickel thick layer of Santyl, cover with single 2x2 gauze moistened  with NS, cover with dry single 2x2 gauze, adhere with paper tape, change BID. We will not follow, but will remain available to this patient, to nursing, and the medical and/or surgical teams.  Please re-consult if we need to assist further.   Fara Olden, RN-C, WTA-C, [place] Wound Treatment Associate Ostomy Care Associate

## 2017-03-31 ENCOUNTER — Ambulatory Visit
Admit: 2017-03-31 | Discharge: 2017-03-31 | Disposition: A | Discharge status: Home / Self Care | Payer: 59 | Attending: Radiation Oncology | Admitting: Radiation Oncology

## 2017-03-31 LAB — CBC WITH DIFFERENTIAL/PLATELET
Basophils Absolute: 0 10*3/uL (ref 0.0–0.1)
Basophils Relative: 0 %
EOS ABS: 0 10*3/uL (ref 0.0–0.7)
Eosinophils Relative: 0 %
HCT: 31.1 % — ABNORMAL LOW (ref 36.0–46.0)
Hemoglobin: 9.9 g/dL — ABNORMAL LOW (ref 12.0–15.0)
LYMPHS ABS: 1.5 10*3/uL (ref 0.7–4.0)
Lymphocytes Relative: 13 %
MCH: 26.5 pg (ref 26.0–34.0)
MCHC: 31.8 g/dL (ref 30.0–36.0)
MCV: 83.4 fL (ref 78.0–100.0)
MONOS PCT: 8 %
Monocytes Absolute: 0.9 10*3/uL (ref 0.1–1.0)
NEUTROS PCT: 79 %
Neutro Abs: 8.7 10*3/uL — ABNORMAL HIGH (ref 1.7–7.7)
Platelets: 279 10*3/uL (ref 150–400)
RBC: 3.73 MIL/uL — ABNORMAL LOW (ref 3.87–5.11)
RDW: 15.4 % (ref 11.5–15.5)
WBC: 11.1 10*3/uL — ABNORMAL HIGH (ref 4.0–10.5)

## 2017-03-31 MED ORDER — HYDROMORPHONE HCL 4 MG PO TABS
2.0000 mg | ORAL_TABLET | ORAL | Status: DC | PRN
  Administered 2017-03-31 – 2017-04-01 (×5): 4 mg via ORAL
  Filled 2017-03-31 (×5): qty 1

## 2017-03-31 MED ORDER — HYDROMORPHONE HCL 1 MG/ML IJ SOLN: 0.5000 mg | INTRAMUSCULAR | Status: DC | PRN

## 2017-03-31 MED ORDER — GABAPENTIN 400 MG PO CAPS
400.0000 mg | ORAL_CAPSULE | Freq: Three times a day (TID) | ORAL | Status: DC
  Administered 2017-03-31 – 2017-04-01 (×4): 400 mg via ORAL
  Filled 2017-03-31 (×4): qty 1

## 2017-03-31 MED ORDER — OXYCODONE HCL ER 40 MG PO T12A
40.0000 mg | EXTENDED_RELEASE_TABLET | Freq: Three times a day (TID) | ORAL | Status: DC
  Administered 2017-03-31 – 2017-04-01 (×3): 40 mg via ORAL
  Filled 2017-03-31 (×3): qty 1

## 2017-03-31 NOTE — Progress Notes (Signed)
[facility] Therapy Treatment Record Phone [phone]   Radiation Therapy was administered to Cindy Faulkner on: 03/31/2017  12:37 PM and was treatment # 8 out of a planned course of 10 treatments.  Radiation Treatment  1). Beam Photons 10-19 MeV  2). Brachytherapy None  3). Stereotactic Radiosurgery None  4). Other Radiation None     Wynona Meals, RT

## 2017-03-31 NOTE — Progress Notes (Signed)
PCA d/c'ed at this time per doctor's verbal order.

## 2017-03-31 NOTE — Progress Notes (Signed)
Cindy Faulkner   DOB:May 21, 1965   KC#:[id number]    Subjective: She felt slight worsening pain since recent reduction in the PCA.  She is awaiting conversion to long-acting pain medicine.  She denies constipation.  She is able to mobilize only short distances, limited by pain.  No recent falls.  She reported no confusion  Objective:  Vitals:   03/30/17 2034 03/31/17 0416  BP: 130/63 (!) 178/77  Pulse: 65 66  Resp: 14 12  Temp: 98.1 F (36.7 C) 98 F (36.7 C)  SpO2: 94% 94%     Intake/Output Summary (Last 24 hours) at 03/31/2017 7494 Last data filed at 03/30/2017 1900 Gross per 24 hour  Intake 720 ml  Output 5 ml  Net 715 ml    GENERAL:alert, no distress and comfortable SKIN: skin color, texture, turgor are normal, no rashes or significant lesions NEURO: alert & oriented x 3 with fluent speech, no focal motor/sensory deficits   Labs:  Lab Results  Component Value Date   WBC 11.1 (H) 03/31/2017   HGB 9.9 (L) 03/31/2017   HCT 31.1 (L) 03/31/2017   MCV 83.4 03/31/2017   PLT 279 03/31/2017   NEUTROABS 8.7 (H) 03/31/2017    Lab Results  Component Value Date   NA 135 03/22/2017   K 4.1 03/22/2017   CL 98 (L) 03/22/2017   CO2 29 03/22/2017   Assessment & Plan:   Metastatic squamous cell carcinoma of unknown origin, with metastatic disease to the sacral region and liver I truly appreciate general surgery consultation.  She will continue radiation therapy as scheduled Planning outpatient chemotherapy to start next week  Severe, uncontrolled cancer pain Pain control has improved. I appreciate palliative care service to assist in pain management. I will defer to palliative care service to transition her pain medications to oral medications.  Opiate-induced constipation, resolved Continue regular laxatives  DVT prophylaxis On Xarelto  Intermittent confusion,resolved Likely induced by medication MRI imaging of her brain excludedbrain metastasis  CODE STATUS Full  code  Weakness and gait instability ContinuePT for assessment and evaluation while she is hospitalized I have given her a prescription for application for parking placard due to her disability  Goals of care discussion The primary goals of care is to get her pain well controlled in this hospitalization.She has failed outpatient therapy  Discharge planning I am hopeful she can be discharged within the next few days and to complete her final treatment next week in the outpatient setting  [name], MD 03/31/2017  9:52 AM

## 2017-03-31 NOTE — Progress Notes (Signed)
Pt was lying in bed when I arrived; her mother was bedside. She said she is doing ok and the oral pain management meds seem to be working so far but she wants to give it at least 24 hours to be sure. Pt still presents a very a positive attitude and strong faith. She enjoys talking about her faith and family and especially her daughter [name]. Her mom is also strong in her faith and both believe pt will get through this. After prayer, when pt went to restroom, her mom shared with me that so far the news is not good but they still believe. Pt had also shared with me in detail her plan of care and the tumors found so far. They were very appreciative of prayer visit. Please page if additional support is needed. Ros[place], [place]   03/31/17 2100  Clinical Encounter Type  Visited With Patient and family together

## 2017-03-31 NOTE — Progress Notes (Signed)
PROGRESS NOTE    Cindy Faulkner  QQV:[id number] DOB: 1965/10/23 DOA: 03/21/2017 PCP: Lennie Odor, PA-C   Brief Narrative: Patient is a 52 year old female with metastatic squamous cell carcinoma from unknown origin with metastases to sacrum and liver region.  She was admitted for pain management.  Oncology following her.  She is undergoing radiation therapy. Palliative care is also following.Plan is to start chemotherapy as an outpatient  Assessment & Plan:   Principal Problem:   Metastatic squamous cell carcinoma involving liver & sacrum with unknown primary site  Active Problems:   Obstructive sleep apnea   Sacral mass   Cancer associated pain   Metastasis to liver (HCC)   Cancer of unknown origin ([place])   Metastasis to liver of unknown origin ([place])  Squamous cell carcinoma of unknown origin/sacral mass/metastatic liver lesions:  Oncology following.  Palliative care following. Continue pain management.  PCA will be discontinued today and she will be started on oral pain medications.Also on Neurontin. She is undergoing radiation therapy.  Plan is to do  remaining sessions   as an outpatient if she can be discharged tomorrow. Planning for outpatient chemotherapy to start next week General surgery was also consulted for possible role of surgical resection of the metastatic lesions.  General surgery will be deciding on that.  Atrial fibrillation:CHADS vasc score 4 On AC with xarelto Rate controlled with flecainide   CVA: On Xarelto.  Constipation: resolved.Continue bowel regimen  Intermittent confusion: Resolved. MRI excluded brain metastasis  Weakness/gait instability: Physical therapy following her  Leucocytosis: Improving.Most likely secondary to dexamethasone.  We will continue to monitor the trend   DVT prophylaxis: xarelto Code Status: Full Family Communication: None present at the bedside  disposition Plan: Home tomorrow   Consultants: Oncology, palliative  care  Procedures: None  Antimicrobials: None  Subjective:  Patient seen and examined the bedside this morning.  Remains comfortable.  Pain is well controlled.  She is ambulating on the hallway. Objective: Vitals:   03/30/17 0515 03/30/17 1306 03/30/17 2034 03/31/17 0416  BP: (!) 153/78 124/73 130/63 (!) 178/77  Pulse: 62 72 65 66  Resp: 18 16 14 12   Temp: 97.8 F (36.6 C) 98.6 F (37 C) 98.1 F (36.7 C) 98 F (36.7 C)  TempSrc: Oral Oral Oral Oral  SpO2: 96% 96% 94% 94%  Weight:    121.4 kg (267 lb 10.2 oz)  Height:        Intake/Output Summary (Last 24 hours) at 03/31/2017 1443 Last data filed at 03/30/2017 1900 Gross per 24 hour  Intake 240 ml  Output -  Net 240 ml   Filed Weights   03/21/17 1040 03/22/17 0503 03/31/17 0416  Weight: 122.5 kg (270 lb) 122.5 kg (270 lb 1 oz) 121.4 kg (267 lb 10.2 oz)    Examination:  General exam: Appears calm and comfortable ,Not in distress,average built Respiratory system: Bilateral equal air entry, normal vesicular breath sounds, no wheezes or crackles  Cardiovascular system: S1 & S2 heard, RRR. No JVD, murmurs, rubs, gallops or clicks.  Gastrointestinal system: Abdomen is nondistended, soft and nontender. No organomegaly or masses felt. Normal bowel sounds heard. Central nervous system: Alert and oriented. No focal neurological deficits. Extremities: No edema, no clubbing ,no cyanosis, distal peripheral pulses palpable. Skin: No cyanosis,No pallor,No Rash,No Ulcer Psychiatry: Judgement and insight appear normal. Mood & affect appropriate.  GU: No Foley   Data Reviewed: I have personally reviewed following labs and imaging studies  CBC: Recent Labs  Lab  03/31/17 0420  WBC 11.1*  NEUTROABS 8.7*  HGB 9.9*  HCT 31.1*  MCV 83.4  PLT 747   Basic Metabolic Panel: No results for input(s): NA, K, CL, CO2, GLUCOSE, BUN, CREATININE, CALCIUM, MG, PHOS in the last 168 hours. GFR: Estimated Creatinine Clearance: 109.8 mL/min (by  C-G formula based on SCr of 0.48 mg/dL). Liver Function Tests: No results for input(s): AST, ALT, ALKPHOS, BILITOT, PROT, ALBUMIN in the last 168 hours. No results for input(s): LIPASE, AMYLASE in the last 168 hours. No results for input(s): AMMONIA in the last 168 hours. Coagulation Profile: No results for input(s): INR, PROTIME in the last 168 hours. Cardiac Enzymes: No results for input(s): CKTOTAL, CKMB, CKMBINDEX, TROPONINI in the last 168 hours. BNP (last 3 results) No results for input(s): PROBNP in the last 8760 hours. HbA1C: No results for input(s): HGBA1C in the last 72 hours. CBG: No results for input(s): GLUCAP in the last 168 hours. Lipid Profile: No results for input(s): CHOL, HDL, LDLCALC, TRIG, CHOLHDL, LDLDIRECT in the last 72 hours. Thyroid Function Tests: No results for input(s): TSH, T4TOTAL, FREET4, T3FREE, THYROIDAB in the last 72 hours. Anemia Panel: No results for input(s): VITAMINB12, FOLATE, FERRITIN, TIBC, IRON, RETICCTPCT in the last 72 hours. Sepsis Labs: No results for input(s): PROCALCITON, LATICACIDVEN in the last 168 hours.  No results found for this or any previous visit (from the past 240 hour(s)).       Radiology Studies: No results found.      Scheduled Meds: . citalopram  20 mg Oral Daily  . dexamethasone  4 mg Oral BID  . flecainide  50 mg Oral Daily  . furosemide  40 mg Oral Daily  . gabapentin  400 mg Oral TID  . HYDROmorphone   Intravenous Q4H  . lidocaine  1 patch Transdermal Q24H  . multivitamin with minerals  1 tablet Oral Daily  . OLANZapine  5 mg Oral QHS  . oxyCODONE  40 mg Oral Q8H  . polyethylene glycol  17 g Oral Daily  . rivaroxaban  20 mg Oral Daily  . senna  2 tablet Oral BID  . silver sulfADIAZINE   Topical BID   Continuous Infusions: . sodium chloride 50 mL/hr at 03/31/17 0859  . ondansetron (ZOFRAN) IV       LOS: 10 days    Time spent: 25 mins    Kiala Faraj Jodie Echevaria, MD [facility] Pager  [phone]  If 7PM-7AM, please contact night-coverage [email or web address] Password [facility] 03/31/2017, 2:43 PM

## 2017-03-31 NOTE — Progress Notes (Signed)
Daily Progress Note   Patient Name: Cindy Faulkner       Date: 03/31/2017 DOB: Dec 20, 1965  Age: 52 y.o. MRN#: [id number] Attending Physician: Marene Lenz, MD Primary Care Physician: Lennie Odor, PA-C Admit Date: 03/21/2017  Reason for Consultation/Follow-up: Non pain symptom management and Pain control  Subjective: I met today with [name] and her mother  She is sitting in chair, she is eager to begin the transition off PCA. She is to go for radiation today, soon to complete radiation treatments and to begin on chemotherapy soon next week. She remains hopeful, is in good spirits, endorsed her being in good mindset and in charge of her decision making process.   We hence discussed about the plan to wean off PCA, [name] is in agreement to proceed, see below:    Reviewed changes to care plan in detail.  See below.  Length of Stay: 10  Current Medications: Scheduled Meds:  . citalopram  20 mg Oral Daily  . dexamethasone  4 mg Oral BID  . flecainide  50 mg Oral Daily  . furosemide  40 mg Oral Daily  . gabapentin  400 mg Oral TID  . HYDROmorphone   Intravenous Q4H  . lidocaine  1 patch Transdermal Q24H  . multivitamin with minerals  1 tablet Oral Daily  . OLANZapine  5 mg Oral QHS  . oxyCODONE  40 mg Oral Q8H  . polyethylene glycol  17 g Oral Daily  . rivaroxaban  20 mg Oral Daily  . senna  2 tablet Oral BID  . silver sulfADIAZINE   Topical BID    Continuous Infusions: . sodium chloride 50 mL/hr at 03/31/17 0859  . ondansetron (ZOFRAN) IV      PRN Meds: acetaminophen, ALPRAZolam, diphenhydrAMINE **OR** diphenhydrAMINE, fentaNYL (SUBLIMAZE) injection, HYDROmorphone (DILAUDID) injection, HYDROmorphone, naloxone **AND** sodium chloride flush, ondansetron **OR** ondansetron  **OR** ondansetron (ZOFRAN) IV **OR** ondansetron (ZOFRAN) IV, sodium chloride flush  Physical Exam         General: Alert, awake, in no acute distress sitting in chair, in good spirits.  HEENT: No bruits, no goiter, no JVD Heart: Regular rate and rhythm. No murmur appreciated. Lungs: Good air movement, clear Abdomen: Soft, nontender, nondistended, positive bowel sounds.  Ext: No significant edema Skin: Warm and dry Neuro: Grossly intact, nonfocal.  Vital Signs: BP (!) 178/77 (BP Location: Right Arm) Comment: RN notified   Pulse 66   Temp 98 F (36.7 C) (Oral)   Resp 12   Ht _0  (1.702 m)   Wt 121.4 kg (267 lb 10.2 oz)   LMP 02/12/2012   SpO2 94%   BMI 41.92 kg/m  SpO2: SpO2: 94 % O2 Device: O2 Device: Not Delivered O2 Flow Rate: O2 Flow Rate (L/min): (on palliative care)  Intake/output summary:   Intake/Output Summary (Last 24 hours) at 03/31/2017 1025 Last data filed at 03/30/2017 1900 Gross per 24 hour  Intake 480 ml  Output 2 ml  Net 478 ml   LBM: Last BM Date: 03/29/17 Baseline Weight: Weight: 122.5 kg (270 lb) Most recent weight: Weight: 121.4 kg (267 lb 10.2 oz)       Palliative Assessment/Data: PPS 50%   Flowsheet Rows     Most Recent Value  Intake Tab  Referral Department  Hospitalist  Unit at Time of Referral  Oncology Unit  Palliative Care Primary Diagnosis  Cancer  Date Notified  03/21/17  Palliative Care Type  New Palliative care  Reason for referral  Pain  Date of Admission  03/21/17  Date first seen by Palliative Care  03/21/17  # of days Palliative referral response time  0 Day(s)  # of days IP prior to Palliative referral  0  Clinical Assessment  Palliative Performance Scale Score  40%  Pain Max last 24 hours  6  Pain Min Last 24 hours  4  Dyspnea Max Last 24 Hours  4  Dyspnea Min Last 24 hours  3  Nausea Max Last 24 Hours  6  Nausea Min Last 24 Hours  4  Psychosocial & Spiritual Assessment  Palliative Care Outcomes  Patient/Family  meeting held?  Yes  Who was at the meeting?  patient, husband, daughter, niece, mother       Patient Active Problem List   Diagnosis Date Noted  . Metastatic squamous cell carcinoma involving liver & sacrum with unknown primary site  03/30/2017  . Metastasis to liver of unknown origin ([place]) 03/18/2017  . Dysuria 03/11/2017  . Herpes zoster 03/11/2017  . Cancer of unknown origin ([place]) 03/11/2017  . Metastasis to liver ([place]) 03/03/2017  . Sacral mass 03/02/2017  . Cancer associated pain 03/02/2017  . Mild single current episode of major depressive disorder ([place]) 08/23/2016  . Cerebral thrombosis with cerebral infarction 12/05/2015  . Stroke (cerebrum) ([place]) 12/05/2015  . Obstructive sleep apnea 07/24/2015  . Preoperative evaluation to rule out surgical contraindication 10/04/2011  . Chest pain, unspecified 06/24/2010  . Shortness of breath 06/24/2010  . Essential hypertension 10/20/2008  . RECTOCELE WITHOUT MENTION OF UTERINE PROLAPSE 10/20/2008  . ENDOMETRIAL POLYP 10/20/2008  . AF (paroxysmal atrial fibrillation) ([place]) 10/20/2008    Palliative Care Assessment & Plan   Patient Profile: 52 year old female with newly diagnosed metastatic squamous cell cancer  Assessment: Patient Active Problem List   Diagnosis Date Noted  . Metastatic squamous cell carcinoma involving liver & sacrum with unknown primary site  03/30/2017  . Metastasis to liver of unknown origin ([place]) 03/18/2017  . Dysuria 03/11/2017  . Herpes zoster 03/11/2017  . Cancer of unknown origin ([place]) 03/11/2017  . Metastasis to liver ([place]) 03/03/2017  . Sacral mass 03/02/2017  . Cancer associated pain 03/02/2017  . Mild single current episode of major depressive disorder ([place]) 08/23/2016  . Cerebral thrombosis with cerebral infarction 12/05/2015  . Stroke (cerebrum) ([place]) 12/05/2015  .  Obstructive sleep apnea 07/24/2015  . Preoperative evaluation to rule out surgical contraindication 10/04/2011  . Chest pain,  unspecified 06/24/2010  . Shortness of breath 06/24/2010  . Essential hypertension 10/20/2008  . RECTOCELE WITHOUT MENTION OF UTERINE PROLAPSE 10/20/2008  . ENDOMETRIAL POLYP 10/20/2008  . AF (paroxysmal atrial fibrillation) ([place]) 10/20/2008    Recommendations/Plan:  Pain:   D/C PCA 2 hours after patient receives first dose of scheduled PO  OxyContin.   PO OxyContin 40 mg Q 8 hours.   PO Dilaudid 2-4 mg PO Q 3 hours PRN  Rescue dose 0.5-1 mg IV Dilaudid Q 3 hours PRN for severe pain uncontrolled  with above.   Increase Adjuvant Neurontin to 400 mg PO 3 times a day. Remains on low dose  steroids and agree.   Hopefully, patient will get to try donut cushion in effort to alleviate sacral pressure/sharp pain patient is feeling at times.    PRN fentanyl 50 mcg as needed in case her pain becomes uncontrolled again with radiation.  Constipation: Continue senna 2 tabs twice daily.  Continue MiraLAX.  Confusion: improving. Likely multifactorial.    Appreciate spiritual care facilitating completion of new advance directive paperwork.  Goals of Care and Additional Recommendations:  Limitations on Scope of Treatment: Full Scope Treatment  Patient is appreciative of surgery input, await further recommendations. Code Status:    Code Status Orders  (From admission, onward)        Start     Ordered   03/21/17 1029  Full code  Continuous     03/21/17 1031    Code Status History    Date Active Date Inactive Code Status Order ID Comments User Context   12/05/2015 23:46 12/06/2015 21:30 Full Code [id number]  Rise Patience, MD ED    Advance Directive Documentation     Most Recent Value  Type of Advance Directive  Healthcare Power of Attorney, Living will  Pre-existing out of facility DNR order (yellow form or pink MOST form)  No data  "MOST" Form in Place?  No data       Prognosis:   Unable to determine  Discharge Planning:  Home within the next few days.   Care plan  was discussed with Patient and mother   Thank you for allowing the Palliative Medicine Team to assist in the care of this patient.   Total Time 35 Prolonged Time Billed No      Greater than 50%  of this time was spent counseling and coordinating care related to the above assessment and plan.  Loistine Chance, MD [phone]  Please contact Palliative Medicine Team phone at [phone] for questions and concerns.

## 2017-04-01 ENCOUNTER — Telehealth: Payer: Self-pay | Admitting: Hematology and Oncology

## 2017-04-01 ENCOUNTER — Ambulatory Visit
Admit: 2017-04-01 | Discharge: 2017-04-01 | Disposition: A | Discharge status: Home / Self Care | Payer: 59 | Attending: Radiation Oncology | Admitting: Radiation Oncology

## 2017-04-01 ENCOUNTER — Other Ambulatory Visit: Payer: Self-pay | Admitting: Hematology and Oncology

## 2017-04-01 ENCOUNTER — Telehealth: Payer: Self-pay | Admitting: *Deleted

## 2017-04-01 ENCOUNTER — Other Ambulatory Visit: Payer: Self-pay | Admitting: *Deleted

## 2017-04-01 DIAGNOSIS — C787 Secondary malignant neoplasm of liver and intrahepatic bile duct: Secondary | ICD-10-CM

## 2017-04-01 DIAGNOSIS — C801 Malignant (primary) neoplasm, unspecified: Secondary | ICD-10-CM

## 2017-04-01 MED ORDER — GABAPENTIN 400 MG PO CAPS: 400.0000 mg | ORAL_CAPSULE | Freq: Three times a day (TID) | ORAL | 0 refills | Status: DC

## 2017-04-01 MED ORDER — ONDANSETRON HCL 4 MG PO TABS: 4.0000 mg | ORAL_TABLET | Freq: Three times a day (TID) | ORAL | 0 refills | Status: DC | PRN

## 2017-04-01 MED ORDER — OXYCODONE HCL ER 40 MG PO T12A
60.0000 mg | EXTENDED_RELEASE_TABLET | Freq: Three times a day (TID) | ORAL | Status: DC
  Administered 2017-04-01: 60 mg via ORAL
  Filled 2017-04-01: qty 1

## 2017-04-01 MED ORDER — POLYETHYLENE GLYCOL 3350 17 G PO PACK: 17.0000 g | PACK | Freq: Every day | ORAL | 0 refills | Status: DC

## 2017-04-01 MED ORDER — SENNA 8.6 MG PO TABS: 2.0000 | ORAL_TABLET | Freq: Two times a day (BID) | ORAL | 0 refills | Status: DC

## 2017-04-01 MED ORDER — OXYCODONE HCL ER 40 MG PO T12A: 40.0000 mg | EXTENDED_RELEASE_TABLET | Freq: Three times a day (TID) | ORAL | 0 refills | Status: DC

## 2017-04-01 MED ORDER — HEPARIN SOD (PORK) LOCK FLUSH 100 UNIT/ML IV SOLN
500.0000 [IU] | Freq: Once | INTRAVENOUS | Status: AC
  Administered 2017-04-01: 500 [IU] via INTRAVENOUS
  Filled 2017-04-01: qty 5

## 2017-04-01 MED ORDER — HYDROMORPHONE HCL 2 MG PO TABS: 2.0000 mg | ORAL_TABLET | ORAL | 0 refills | Status: DC | PRN

## 2017-04-01 MED ORDER — OLANZAPINE 5 MG PO TABS: 5.0000 mg | ORAL_TABLET | Freq: Every day | ORAL | 0 refills | Status: DC

## 2017-04-01 MED ORDER — DEXAMETHASONE 4 MG PO TABS: 4.0000 mg | ORAL_TABLET | Freq: Two times a day (BID) | ORAL | 0 refills | Status: DC

## 2017-04-01 MED ORDER — LIDOCAINE-PRILOCAINE 2.5-2.5 % EX CREA: 1.0000 "application " | TOPICAL_CREAM | CUTANEOUS | 0 refills | Status: DC | PRN

## 2017-04-01 MED ORDER — OXYCODONE HCL ER 60 MG PO T12A: 60.0000 mg | EXTENDED_RELEASE_TABLET | Freq: Three times a day (TID) | ORAL | 0 refills | Status: DC

## 2017-04-01 MED ORDER — METOPROLOL TARTRATE 50 MG PO TABS
50.0000 mg | ORAL_TABLET | Freq: Every day | ORAL | Status: DC
  Administered 2017-04-01: 50 mg via ORAL
  Filled 2017-04-01: qty 1

## 2017-04-01 NOTE — Discharge Summary (Signed)
Physician Discharge Summary  Cindy Faulkner UDJ:[id number] DOB: 26-Nov-1965 DOA: 03/21/2017  PCP: Lennie Odor, PA-C  Admit date: 03/21/2017 Discharge date: 04/01/2017  Admitted From: Home Disposition:  Home    Discharge Condition:Stable CODE STATUS:Full Diet recommendation: Heart Healthy  Brief/Interim Summary:  Patient is a 52 year old female with metastatic squamous cell carcinoma from unknown origin with metastases to sacrum and liver region.  She was admitted for pain management. Oncology was following her.  She was undergoing radiation therapy. Palliative care was also following for pain mangement.Plan is to start chemotherapy as an outpatient. Currently she is off PCA .  She has been started on oral pain medications.  Patient is being discharged today to home.  Following problems were addressed during hospitalization:  Squamous cell carcinoma of unknown origin/sacral mass/metastatic liver lesions:  Oncology was following.  Palliative care was following. Continue pain management.  PCA  discontinued today and she was started on oral pain medications.Also on Neurontin. She is undergoing radiation therapy.  Plan is to do  remaining sessions   as an outpatient. Planning for outpatient chemotherapy to start next week  Atrial fibrillation:CHADS vasc score 4 On AC with xarelto Rate controlled with flecainide   CVA: On Xarelto.  Constipation: Resolved.Continue bowel regimen  Intermittent confusion: Resolved. MRI excluded brain metastasis  Weakness/gait instability: Physical therapy was following her  Leucocytosis: Improving.Most likely secondary to dexamethasone.     Discharge Diagnoses:  Principal Problem:   Metastatic squamous cell carcinoma involving liver & sacrum with unknown primary site  Active Problems:   Obstructive sleep apnea   Sacral mass   Cancer associated pain   Metastasis to liver (HCC)   Cancer of unknown origin ([name])   Metastasis to liver of  unknown origin [facility])    Discharge Instructions  Discharge Instructions    Diet - low sodium heart healthy   Complete by:  As directed    Discharge instructions   Complete by:  As directed    1)Follow up with your PCP and oncologist within a week.   Increase activity slowly   Complete by:  As directed      Allergies as of 04/01/2017   No Known Allergies     Medication List    STOP taking these medications   morphine 60 MG 12 hr tablet Commonly known as:  MS CONTIN   valACYclovir 1000 MG tablet Commonly known as:  VALTREX     TAKE these medications   ALPRAZolam 0.25 MG tablet Commonly known as:  XANAX Take 0.25 mg by mouth 3 (three) times daily as needed for sleep.   atorvastatin 40 MG tablet Commonly known as:  LIPITOR Take 1 tablet (40 mg total) by mouth daily at 6 PM.   Biotin 1000 MCG tablet Take 1,000 mcg by mouth daily.   cholecalciferol 1000 units tablet Commonly known as:  VITAMIN D Take 2,000 Units by mouth daily.   citalopram 20 MG tablet Commonly known as:  CELEXA Take 20 mg by mouth daily.   CURCUMIN 95 500 MG Caps Generic drug:  Turmeric Take 3 capsules by mouth daily.   dexamethasone 4 MG tablet Commonly known as:  DECADRON Take 1 tablet (4 mg total) by mouth 2 (two) times daily.   Fish Oil 1000 MG Caps Take 2 capsules by mouth daily.   flecainide 50 MG tablet Commonly known as:  TAMBOCOR Take 1 tablet (50 mg total) by mouth daily.   furosemide 40 MG tablet Commonly known as:  LASIX Take  40 mg by mouth daily.   gabapentin 300 MG capsule Commonly known as:  NEURONTIN Take 300 mg by mouth 3 (three) times daily.   GLUCOSAMINE CHONDR COMPLEX PO Take 2 capsules by mouth daily.   HYDROmorphone 2 MG tablet Commonly known as:  DILAUDID Take 1-2 tablets (2-4 mg total) by mouth every 3 (three) hours as needed for moderate pain or severe pain. What changed:    medication strength  how much to take  when to take this  reasons to  take this   metoprolol tartrate 50 MG tablet Commonly known as:  LOPRESSOR Take 1 tablet (50 mg total) by mouth daily.   multivitamin tablet Take 2 tablets by mouth daily.   OLANZapine 5 MG tablet Commonly known as:  ZYPREXA Take 1 tablet (5 mg total) by mouth at bedtime.   ondansetron 4 MG tablet Commonly known as:  ZOFRAN Take 1-2 tablets (4-8 mg total) by mouth every 8 (eight) hours as needed for nausea (not responsive to prochlorperazine (COMPAZINE)).   oxyCODONE 40 mg 12 hr tablet Commonly known as:  OXYCONTIN Take 1 tablet (40 mg total) by mouth every 8 (eight) hours.   polyethylene glycol packet Commonly known as:  MIRALAX / GLYCOLAX Take 17 g by mouth daily.   rivaroxaban 20 MG Tabs tablet Commonly known as:  XARELTO Take 1 tablet (20 mg total) by mouth daily.   senna 8.6 MG Tabs tablet Commonly known as:  SENOKOT Take 2 tablets (17.2 mg total) by mouth 2 (two) times daily.      Follow-up Information    Redmon, Barth Kirks, PA-C. Schedule an appointment as soon as possible for a visit in 1 week(s).   Specialty:  Nurse Practitioner Contact information: [address] [place] [address] [phone]          No Known Allergies  Consultations:  Oncology, palliative care   Procedures/Studies: Ct Chest W Contrast  Result Date: 03/03/2017 CLINICAL DATA:  Generalized intra-abdominal and pelvic swelling. Mass and lump. EXAM: CT CHEST, ABDOMEN, AND PELVIS WITH CONTRAST TECHNIQUE: Multidetector CT imaging of the chest, abdomen and pelvis was performed following the standard protocol during bolus administration of intravenous contrast. CONTRAST:  <See Chart> ISOVUE-300 IOPAMIDOL (ISOVUE-300) INJECTION 61% COMPARISON:  None. FINDINGS: CT CHEST FINDINGS Cardiovascular: Normal heart size.  No pericardial effusion. Mediastinum/Nodes: The trachea is patent and midline. Normal appearance of the esophagus. No enlarged mediastinal or hilar lymph nodes. No  axillary or supraclavicular adenopathy. Lungs/Pleura: No pleural effusions. Platelike atelectasis versus scar identified within the right middle lobe, right lower lobe and lingula. No suspicious pulmonary nodules or masses. Musculoskeletal: Degenerative disc disease identified within the thoracic spine. CT ABDOMEN PELVIS FINDINGS Hepatobiliary: Diffuse hepatic steatosis. Peripherally enhancing lesion within the lateral segment of left lobe of liver measures 2.3 cm, image 56 of series 2. Anterior dome of liver lesion measures 1.8 cm, image 42 of series 2. There are at least scratch set multiple lesions within the right lobe of liver noted. The largest is in segment 7 measuring 10.3 by 5.2 x 5.9 cm, image 59 of series 2. The gallbladder appears normal. Pancreas: Unremarkable. No pancreatic ductal dilatation or surrounding inflammatory changes. Spleen: Normal in size without focal abnormality. Adrenals/Urinary Tract: The adrenal glands are normal. Small nonobstructing right renal calculi noted within the interpolar kidney. No mass or hydronephrosis. Urinary bladder appears normal. Stomach/Bowel: Stomach is within normal limits. Appendix appears normal. No evidence of bowel wall thickening, distention, or inflammatory changes. Vascular/Lymphatic:  No significant vascular findings are present. No enlarged abdominal or pelvic lymph nodes. Reproductive: Uterus and bilateral adnexa are unremarkable. Other: Left posterior pelvic mass extending into the obturator foramen measures 7 by 4.9 cm, image 115 of series 2. No surrounding bone changes identified. Musculoskeletal: No aggressive lytic or sclerotic bone lesions. IMPRESSION: 1. Multifocal liver metastasis. 2. Left posterior pelvic mass extending into the obturator foramen measures up to 7 cm. Suspicious for malignancy. This should be easily amendable to percutaneous tissue sampling under image guidance. 3. No lytic bone lesions identified of myeloma. Electronically Signed    By: Kerby Moors M.D.   On: 03/03/2017 11:21   Mr Jeri Cos OJ Contrast  Result Date: 03/23/2017 CLINICAL DATA:  52 y/o F; metastatic squamous cell carcinoma for staging. Patient with confusion. EXAM: MRI HEAD WITHOUT AND WITH CONTRAST TECHNIQUE: Multiplanar, multiecho pulse sequences of the brain and surrounding structures were obtained without and with intravenous contrast. CONTRAST:  27mL MULTIHANCE GADOBENATE DIMEGLUMINE 529 MG/ML IV SOLN COMPARISON:  12/05/2015 MRI head FINDINGS: Brain: Motion degradation on multiple sequences. No acute infarction, hemorrhage, hydrocephalus, extra-axial collection or mass lesion. Fewnonspecific foci of T2 FLAIR hyperintense signal abnormality in subcortical and periventricular white matter are compatible withmildchronic microvascular ischemic changes for age. Mildbrain parenchymal volume loss. Small chronic lacunar infarct within genu of left internal capsule. No abnormal enhancement of the brain. Vascular: Normal flow voids. Skull and upper cervical spine: Normal marrow signal. Sinuses/Orbits: Right maxillary sinus mucous retention cyst. Otherwise negative. Other: None. IMPRESSION: 1. No intracranial metastasis identified. 2. Mild chronic microvascular ischemic changes and mild parenchymal volume loss of the brain. 3. Small chronic lacunar infarct within genu of left internal capsule. Electronically Signed   By: Kristine Garbe M.D.   On: 03/23/2017 22:10   Ct Abdomen Pelvis W Contrast  Result Date: 03/03/2017 CLINICAL DATA:  Generalized intra-abdominal and pelvic swelling. Mass and lump. EXAM: CT CHEST, ABDOMEN, AND PELVIS WITH CONTRAST TECHNIQUE: Multidetector CT imaging of the chest, abdomen and pelvis was performed following the standard protocol during bolus administration of intravenous contrast. CONTRAST:  <See Chart> ISOVUE-300 IOPAMIDOL (ISOVUE-300) INJECTION 61% COMPARISON:  None. FINDINGS: CT CHEST FINDINGS Cardiovascular: Normal heart size.  No  pericardial effusion. Mediastinum/Nodes: The trachea is patent and midline. Normal appearance of the esophagus. No enlarged mediastinal or hilar lymph nodes. No axillary or supraclavicular adenopathy. Lungs/Pleura: No pleural effusions. Platelike atelectasis versus scar identified within the right middle lobe, right lower lobe and lingula. No suspicious pulmonary nodules or masses. Musculoskeletal: Degenerative disc disease identified within the thoracic spine. CT ABDOMEN PELVIS FINDINGS Hepatobiliary: Diffuse hepatic steatosis. Peripherally enhancing lesion within the lateral segment of left lobe of liver measures 2.3 cm, image 56 of series 2. Anterior dome of liver lesion measures 1.8 cm, image 42 of series 2. There are at least scratch set multiple lesions within the right lobe of liver noted. The largest is in segment 7 measuring 10.3 by 5.2 x 5.9 cm, image 59 of series 2. The gallbladder appears normal. Pancreas: Unremarkable. No pancreatic ductal dilatation or surrounding inflammatory changes. Spleen: Normal in size without focal abnormality. Adrenals/Urinary Tract: The adrenal glands are normal. Small nonobstructing right renal calculi noted within the interpolar kidney. No mass or hydronephrosis. Urinary bladder appears normal. Stomach/Bowel: Stomach is within normal limits. Appendix appears normal. No evidence of bowel wall thickening, distention, or inflammatory changes. Vascular/Lymphatic: No significant vascular findings are present. No enlarged abdominal or pelvic lymph nodes. Reproductive: Uterus and bilateral adnexa are unremarkable. Other:  Left posterior pelvic mass extending into the obturator foramen measures 7 by 4.9 cm, image 115 of series 2. No surrounding bone changes identified. Musculoskeletal: No aggressive lytic or sclerotic bone lesions. IMPRESSION: 1. Multifocal liver metastasis. 2. Left posterior pelvic mass extending into the obturator foramen measures up to 7 cm. Suspicious for  malignancy. This should be easily amendable to percutaneous tissue sampling under image guidance. 3. No lytic bone lesions identified of myeloma. Electronically Signed   By: Kerby Moors M.D.   On: 03/03/2017 11:21   US Biopsy (liver)  Result Date: 03/09/2017 CLINICAL DATA:  Lytic presacral lesion. Multiple liver lesions suggesting metastatic disease. EXAM: ULTRASOUND GUIDED CORE BIOPSY OF LIVER LESION MEDICATIONS: Intravenous Fentanyl and Versed were administered as conscious sedation during continuous monitoring of the patient's level of consciousness and physiological / cardiorespiratory status by the radiology RN, with a total moderate sedation time of 13 minutes. PROCEDURE: The procedure, risks, benefits, and alternatives were explained to the patient. Questions regarding the procedure were encouraged and answered. The patient understands and consents to the procedure. Survey ultrasound of the liver was performed. A representative lesion corresponding to CT findings was localized. Skin entry site was determined and marked. The operative field was prepped with chlorhexidine in a sterile fashion, and a sterile drape was applied covering the operative field. A sterile gown and sterile gloves were used for the procedure. Local anesthesia was provided with 1% Lidocaine. Under real-time ultrasound guidance, a 17 gauge trocar needle was advanced to the margin of the lesion. Once needle tip position was confirmed, coaxial 18-gauge core biopsy samples were obtained, submitted in formalin to surgical pathology. The guide needle was removed. Postprocedure scans show no hemorrhage or other apparent complication. The patient tolerated the procedure well. COMPLICATIONS: None. FINDINGS: Hypoechoic liver lesions corresponding to CT findings were localized. Representative core biopsy samples obtained as above. IMPRESSION: 1. Technically successful ultrasound-guided core liver lesion biopsy Electronically Signed   By: Lucrezia Europe M.D.   On: 03/09/2017 17:01   Ir US Guide Vasc Access Right  Result Date: 03/23/2017 CLINICAL DATA:  METASTATIC SQUAMOUS CELL CARCINOMA EXAM: RIGHT INTERNAL JUGULAR SINGLE LUMEN POWER PORT CATHETER INSERTION Date:  03/23/2017 03/23/2017 3:41 pm Radiologist:  Jerilynn Mages. Daryll Brod, MD Guidance:  Ultrasound and fluoroscopic MEDICATIONS: Ancef 3 g; The antibiotic was administered within an appropriate time interval prior to skin puncture. ANESTHESIA/SEDATION: Versed 2.0 mg IV; Fentanyl 100 mcg IV; Moderate Sedation Time:  35 minutes The patient was continuously monitored during the procedure by the interventional radiology nurse under my direct supervision. FLUOROSCOPY TIME:  One minutes, 12 seconds (22 mGy) COMPLICATIONS: None immediate. CONTRAST:  None. PROCEDURE: Informed consent was obtained from the patient following explanation of the procedure, risks, benefits and alternatives. The patient understands, agrees and consents for the procedure. All questions were addressed. A time out was performed. Maximal barrier sterile technique utilized including caps, mask, sterile gowns, sterile gloves, large sterile drape, hand hygiene, and 2% chlorhexidine scrub. Under sterile conditions and local anesthesia, right internal jugular micropuncture venous access was performed. Access was performed with ultrasound. Images were obtained for documentation. A guide wire was inserted followed by a transitional dilator. This allowed insertion of a guide wire and catheter into the IVC. Measurements were obtained from the SVC / RA junction back to the right IJ venotomy site. In the right infraclavicular chest, a subcutaneous pocket was created over the second anterior rib. This was done under sterile conditions and local anesthesia. 1% lidocaine with  epinephrine was utilized for this. A 2.5 cm incision was made in the skin. Blunt dissection was performed to create a subcutaneous pocket over the right pectoralis major muscle. The  pocket was flushed with saline vigorously. There was adequate hemostasis. The port catheter was assembled and checked for leakage. The port catheter was secured in the pocket with two retention sutures. The tubing was tunneled subcutaneously to the right venotomy site and inserted into the SVC/RA junction through a valved peel-away sheath. Position was confirmed with fluoroscopy. Images were obtained for documentation. The patient tolerated the procedure well. No immediate complications. Incisions were closed in a two layer fashion with 4 - 0 Vicryl suture. Dermabond was applied to the skin. The port catheter was accessed, blood was aspirated followed by saline and heparin flushes. Needle was removed. A dry sterile dressing was applied. IMPRESSION: Ultrasound and fluoroscopically guided right internal jugular single lumen power port catheter insertion. Tip in the SVC/RA junction. Catheter ready for use. Electronically Signed   By: Jerilynn Mages.  Shick M.D.   On: 03/23/2017 15:48   Ir Fluoro Guide Port Insertion Right  Result Date: 03/23/2017 CLINICAL DATA:  METASTATIC SQUAMOUS CELL CARCINOMA EXAM: RIGHT INTERNAL JUGULAR SINGLE LUMEN POWER PORT CATHETER INSERTION Date:  03/23/2017 03/23/2017 3:41 pm Radiologist:  Jerilynn Mages. Daryll Brod, MD Guidance:  Ultrasound and fluoroscopic MEDICATIONS: Ancef 3 g; The antibiotic was administered within an appropriate time interval prior to skin puncture. ANESTHESIA/SEDATION: Versed 2.0 mg IV; Fentanyl 100 mcg IV; Moderate Sedation Time:  35 minutes The patient was continuously monitored during the procedure by the interventional radiology nurse under my direct supervision. FLUOROSCOPY TIME:  One minutes, 12 seconds (22 mGy) COMPLICATIONS: None immediate. CONTRAST:  None. PROCEDURE: Informed consent was obtained from the patient following explanation of the procedure, risks, benefits and alternatives. The patient understands, agrees and consents for the procedure. All questions were addressed. A  time out was performed. Maximal barrier sterile technique utilized including caps, mask, sterile gowns, sterile gloves, large sterile drape, hand hygiene, and 2% chlorhexidine scrub. Under sterile conditions and local anesthesia, right internal jugular micropuncture venous access was performed. Access was performed with ultrasound. Images were obtained for documentation. A guide wire was inserted followed by a transitional dilator. This allowed insertion of a guide wire and catheter into the IVC. Measurements were obtained from the SVC / RA junction back to the right IJ venotomy site. In the right infraclavicular chest, a subcutaneous pocket was created over the second anterior rib. This was done under sterile conditions and local anesthesia. 1% lidocaine with epinephrine was utilized for this. A 2.5 cm incision was made in the skin. Blunt dissection was performed to create a subcutaneous pocket over the right pectoralis major muscle. The pocket was flushed with saline vigorously. There was adequate hemostasis. The port catheter was assembled and checked for leakage. The port catheter was secured in the pocket with two retention sutures. The tubing was tunneled subcutaneously to the right venotomy site and inserted into the SVC/RA junction through a valved peel-away sheath. Position was confirmed with fluoroscopy. Images were obtained for documentation. The patient tolerated the procedure well. No immediate complications. Incisions were closed in a two layer fashion with 4 - 0 Vicryl suture. Dermabond was applied to the skin. The port catheter was accessed, blood was aspirated followed by saline and heparin flushes. Needle was removed. A dry sterile dressing was applied. IMPRESSION: Ultrasound and fluoroscopically guided right internal jugular single lumen power port catheter insertion. Tip in the SVC/RA  junction. Catheter ready for use. Electronically Signed   By: Jerilynn Mages.  Shick M.D.   On: 03/23/2017 15:48    (Echo,  Carotid, EGD, Colonoscopy, ERCP)    Subjective:   Discharge Exam: Vitals:   03/31/17 2053 04/01/17 0412  BP: (!) 151/70 (!) 181/95  Pulse: 68 65  Resp: 14 12  Temp:  97.9 F (36.6 C)  SpO2: 100% 100%   Vitals:   03/31/17 0416 03/31/17 1750 03/31/17 2053 04/01/17 0412  BP: (!) 178/77 139/65 (!) 151/70 (!) 181/95  Pulse: 66 84 68 65  Resp: 12  14 12   Temp: 98 F (36.7 C) 98.9 F (37.2 C)  97.9 F (36.6 C)  TempSrc: Oral  Oral Oral  SpO2: 94% 99% 100% 100%  Weight: 121.4 kg (267 lb 10.2 oz)   121.4 kg (267 lb 10.2 oz)  Height:        General: Pt is alert, awake, not in acute distress Cardiovascular: RRR, S1/S2 +, no rubs, no gallops Respiratory: CTA bilaterally, no wheezing, no rhonchi Abdominal: Soft, NT, ND, bowel sounds + Extremities: no edema, no cyanosis    The results of significant diagnostics from this hospitalization (including imaging, microbiology, ancillary and laboratory) are listed below for reference.     Microbiology: No results found for this or any previous visit (from the past 240 hour(s)).   Labs: BNP (last 3 results) No results for input(s): BNP in the last 8760 hours. Basic Metabolic Panel: No results for input(s): NA, K, CL, CO2, GLUCOSE, BUN, CREATININE, CALCIUM, MG, PHOS in the last 168 hours. Liver Function Tests: No results for input(s): AST, ALT, ALKPHOS, BILITOT, PROT, ALBUMIN in the last 168 hours. No results for input(s): LIPASE, AMYLASE in the last 168 hours. No results for input(s): AMMONIA in the last 168 hours. CBC: Recent Labs  Lab 03/31/17 0420  WBC 11.1*  NEUTROABS 8.7*  HGB 9.9*  HCT 31.1*  MCV 83.4  PLT 279   Cardiac Enzymes: No results for input(s): CKTOTAL, CKMB, CKMBINDEX, TROPONINI in the last 168 hours. BNP: Invalid input(s): POCBNP CBG: No results for input(s): GLUCAP in the last 168 hours. D-Dimer No results for input(s): DDIMER in the last 72 hours. Hgb A1c No results for input(s): HGBA1C in the  last 72 hours. Lipid Profile No results for input(s): CHOL, HDL, LDLCALC, TRIG, CHOLHDL, LDLDIRECT in the last 72 hours. Thyroid function studies No results for input(s): TSH, T4TOTAL, T3FREE, THYROIDAB in the last 72 hours.  Invalid input(s): FREET3 Anemia work up No results for input(s): VITAMINB12, FOLATE, FERRITIN, TIBC, IRON, RETICCTPCT in the last 72 hours. Urinalysis    Component Value Date/Time   COLORURINE YELLOW 12/05/2015 2033   APPEARANCEUR CLOUDY (A) 12/05/2015 2033   LABSPEC 1.026 12/05/2015 2033   PHURINE 6.0 12/05/2015 2033   GLUCOSEU NEGATIVE 12/05/2015 2033   HGBUR MODERATE (A) 12/05/2015 2033   BILIRUBINUR NEGATIVE 12/05/2015 2033   [place] 12/05/2015 2033   PROTEINUR NEGATIVE 12/05/2015 2033   UROBILINOGEN 0.2 11/27/2010 1122   NITRITE NEGATIVE 12/05/2015 2033   LEUKOCYTESUR NEGATIVE 12/05/2015 2033   Sepsis Labs Invalid input(s): PROCALCITONIN,  WBC,  LACTICIDVEN Microbiology No results found for this or any previous visit (from the past 240 hour(s)).   Time coordinating discharge: Over 30 minutes  SIGNED:   Marene Lenz, MD  [facility] 04/01/2017, 10:56 AM Pager [phone]  If 7PM-7AM, please contact night-coverage [email or web address] Password [email or web address]

## 2017-04-01 NOTE — Progress Notes (Signed)
Daily Progress Note   Patient Name: Cindy Faulkner       Date: 04/01/2017 DOB: Jun 03, 1965  Age: 52 y.o. MRN#: [id number] Attending Physician: Marene Lenz, MD Primary Care Physician: Lennie Odor, PA-C Admit Date: 03/21/2017  Reason for Consultation/Follow-up: Non pain symptom management and Pain control  Subjective: I met today with Cindy Faulkner and her mother  She is resting in bed She is off PCA Med history noted Discussed in detail with her about pain management She is eager to go home today Has chemotherapy planned for next week  eating ok Did have BM Denies nausea Not confused anymore In good spirits     Reviewed changes to care plan in detail.  See below.  Length of Stay: 11  Current Medications: Scheduled Meds:  . citalopram  20 mg Oral Daily  . dexamethasone  4 mg Oral BID  . flecainide  50 mg Oral Daily  . furosemide  40 mg Oral Daily  . gabapentin  400 mg Oral TID  . lidocaine  1 patch Transdermal Q24H  . metoprolol tartrate  50 mg Oral Daily  . multivitamin with minerals  1 tablet Oral Daily  . OLANZapine  5 mg Oral QHS  . oxyCODONE  60 mg Oral Q8H  . polyethylene glycol  17 g Oral Daily  . rivaroxaban  20 mg Oral Daily  . senna  2 tablet Oral BID  . silver sulfADIAZINE   Topical BID    Continuous Infusions: . ondansetron (ZOFRAN) IV      PRN Meds: acetaminophen, ALPRAZolam, diphenhydrAMINE **OR** diphenhydrAMINE, fentaNYL (SUBLIMAZE) injection, HYDROmorphone (DILAUDID) injection, HYDROmorphone, naloxone **AND** sodium chloride flush, ondansetron **OR** ondansetron **OR** ondansetron (ZOFRAN) IV **OR** ondansetron (ZOFRAN) IV, sodium chloride flush  Physical Exam         General: Alert, awake, in no acute distress  , in good spirits.  HEENT: No  bruits, no goiter, no JVD Heart: Regular rate and rhythm. No murmur appreciated. Lungs: Good air movement, clear Abdomen: Soft, nontender, nondistended, positive bowel sounds.  Ext: No significant edema Skin: Warm and dry Neuro: Grossly intact, nonfocal.   Vital Signs: BP (!) 181/95 (BP Location: Left Arm)   Pulse 65   Temp 97.9 F (36.6 C) (Oral)   Resp 12   Ht '5\' 7"'  (1.702 m)  Wt 121.4 kg (267 lb 10.2 oz)   LMP 02/12/2012   SpO2 100%   BMI 41.92 kg/m  SpO2: SpO2: 100 % O2 Device: O2 Device: Not Delivered O2 Flow Rate: O2 Flow Rate (L/min): (on palliative care)  Intake/output summary:   Intake/Output Summary (Last 24 hours) at 04/01/2017 1107 Last data filed at 03/31/2017 1400 Gross per 24 hour  Intake 240 ml  Output -  Net 240 ml   LBM: Last BM Date: 03/30/17 Baseline Weight: Weight: 122.5 kg (270 lb) Most recent weight: Weight: 121.4 kg (267 lb 10.2 oz)       Palliative Assessment/Data: PPS 50%   Flowsheet Rows     Most Recent Value  Intake Tab  Referral Department  Hospitalist  Unit at Time of Referral  Oncology Unit  Palliative Care Primary Diagnosis  Cancer  Date Notified  03/21/17  Palliative Care Type  New Palliative care  Reason for referral  Pain  Date of Admission  03/21/17  Date first seen by Palliative Care  03/21/17  # of days Palliative referral response time  0 Day(s)  # of days IP prior to Palliative referral  0  Clinical Assessment  Palliative Performance Scale Score  40%  Pain Max last 24 hours  6  Pain Min Last 24 hours  4  Dyspnea Max Last 24 Hours  4  Dyspnea Min Last 24 hours  3  Nausea Max Last 24 Hours  6  Nausea Min Last 24 Hours  4  Psychosocial & Spiritual Assessment  Palliative Care Outcomes  Patient/Family meeting held?  Yes  Who was at the meeting?  patient, husband, daughter, niece, mother       Patient Active Problem List   Diagnosis Date Noted  . Metastatic squamous cell carcinoma involving liver & sacrum with  unknown primary site  03/30/2017  . Metastasis to liver of unknown origin ([place]) 03/18/2017  . Dysuria 03/11/2017  . Herpes zoster 03/11/2017  . Cancer of unknown origin ([place]) 03/11/2017  . Metastasis to liver ([place]) 03/03/2017  . Sacral mass 03/02/2017  . Cancer associated pain 03/02/2017  . Mild single current episode of major depressive disorder ([place]) 08/23/2016  . Cerebral thrombosis with cerebral infarction 12/05/2015  . Stroke (cerebrum) ([place]) 12/05/2015  . Obstructive sleep apnea 07/24/2015  . Preoperative evaluation to rule out surgical contraindication 10/04/2011  . Chest pain, unspecified 06/24/2010  . Shortness of breath 06/24/2010  . Essential hypertension 10/20/2008  . RECTOCELE WITHOUT MENTION OF UTERINE PROLAPSE 10/20/2008  . ENDOMETRIAL POLYP 10/20/2008  . AF (paroxysmal atrial fibrillation) ([place]) 10/20/2008    Palliative Care Assessment & Plan   Patient Profile: 52 year old female with newly diagnosed metastatic squamous cell cancer  Assessment: Patient Active Problem List   Diagnosis Date Noted  . Metastatic squamous cell carcinoma involving liver & sacrum with unknown primary site  03/30/2017  . Metastasis to liver of unknown origin ([place]) 03/18/2017  . Dysuria 03/11/2017  . Herpes zoster 03/11/2017  . Cancer of unknown origin ([place]) 03/11/2017  . Metastasis to liver ([place]) 03/03/2017  . Sacral mass 03/02/2017  . Cancer associated pain 03/02/2017  . Mild single current episode of major depressive disorder ([place]) 08/23/2016  . Cerebral thrombosis with cerebral infarction 12/05/2015  . Stroke (cerebrum) ([place]) 12/05/2015  . Obstructive sleep apnea 07/24/2015  . Preoperative evaluation to rule out surgical contraindication 10/04/2011  . Chest pain, unspecified 06/24/2010  . Shortness of breath 06/24/2010  . Essential hypertension 10/20/2008  . RECTOCELE WITHOUT MENTION  OF UTERINE PROLAPSE 10/20/2008  . ENDOMETRIAL POLYP 10/20/2008  . AF (paroxysmal atrial  fibrillation) ([place]) 10/20/2008    Recommendations/Plan:  Pain:   Off  PCA 2   PO OxyContin 60 mg Q 8 hours.   PO Dilaudid 2-4 mg PO Q 3 hours PRN      Adjuvant Neurontin to 400 mg PO 3 times a day. Remains on low dose  steroids and agree.   Patient's mother bought a donut cushion in effort to alleviate sacral pressure/sharp pain patient is feeling at times.     Constipation: Continue senna 2 tabs twice daily.  Continue MiraLAX.  Confusion: improved   Appreciate spiritual care facilitating completion of new advance directive paperwork.  Goals of Care and Additional Recommendations:  Limitations on Scope of Treatment: Full Scope Treatment  Patient is appreciative of surgery input, await further recommendations. Code Status:    Code Status Orders  (From admission, onward)        Start     Ordered   03/21/17 1029  Full code  Continuous     03/21/17 1031    Code Status History    Date Active Date Inactive Code Status Order ID Comments User Context   12/05/2015 23:46 12/06/2015 21:30 Full Code [id number]  Rise Patience, MD ED    Advance Directive Documentation     Most Recent Value  Type of Advance Directive  Healthcare Power of Attorney, Living will  Pre-existing out of facility DNR order (yellow form or pink MOST form)  No data  "MOST" Form in Place?  No data       Prognosis:   Unable to determine  Discharge Planning:  Home today  Care plan was discussed with Patient and mother   Thank you for allowing the Palliative Medicine Team to assist in the care of this patient.   Total Time 35 Prolonged Time Billed No      Greater than 50%  of this time was spent counseling and coordinating care related to the above assessment and plan.  Loistine Chance, MD [phone]  Please contact Palliative Medicine Team phone at [phone] for questions and concerns.

## 2017-04-01 NOTE — Telephone Encounter (Signed)
04/01/17 @ 2:41 pm spoke with patient confirming Disability paperwork was successfully faxed to [facility] on 03/25/17 @ 7:53 am.  Patient requested a copy be mailed for personal records.

## 2017-04-01 NOTE — Telephone Encounter (Signed)
Pt called asking for script for [place] to be called to [facility] on [address].  This was done.

## 2017-04-01 NOTE — Progress Notes (Signed)
[facility] Therapy Treatment Record Phone [phone]   Radiation Therapy was administered to Cindy Faulkner on: 04/01/2017  11:26 AM and was treatment # 9 out of a planned course of 10 treatments.  Radiation Treatment  1). Beam photons with 6-10 energy  2). Brachytherapy None  3). Stereotactic Radiosurgery None  4). Other Radiation None     Bennett Scrape, RT (T)

## 2017-04-04 ENCOUNTER — Encounter: Payer: Self-pay | Admitting: *Deleted

## 2017-04-04 ENCOUNTER — Encounter: Payer: Self-pay | Admitting: Hematology and Oncology

## 2017-04-04 ENCOUNTER — Inpatient Hospital Stay: Payer: 59

## 2017-04-04 ENCOUNTER — Other Ambulatory Visit: Payer: Self-pay | Admitting: *Deleted

## 2017-04-04 ENCOUNTER — Inpatient Hospital Stay: Payer: 59 | Attending: Hematology and Oncology

## 2017-04-04 ENCOUNTER — Inpatient Hospital Stay (HOSPITAL_BASED_OUTPATIENT_CLINIC_OR_DEPARTMENT_OTHER): Payer: 59 | Admitting: Hematology and Oncology

## 2017-04-04 ENCOUNTER — Telehealth: Payer: Self-pay | Admitting: Hematology and Oncology

## 2017-04-04 ENCOUNTER — Ambulatory Visit
Admission: RE | Admit: 2017-04-04 | Discharge: 2017-04-04 | Disposition: A | Discharge status: Home / Self Care | Payer: 59 | Source: Ambulatory Visit | Attending: Radiation Oncology | Admitting: Radiation Oncology

## 2017-04-04 VITALS — BP 122/102 | HR 59 | Temp 98.2°F | Resp 18 | Ht 67.0 in | Wt 265.6 lb

## 2017-04-04 DIAGNOSIS — C539 Malignant neoplasm of cervix uteri, unspecified: Secondary | ICD-10-CM | POA: Diagnosis not present

## 2017-04-04 DIAGNOSIS — C7951 Secondary malignant neoplasm of bone: Secondary | ICD-10-CM | POA: Diagnosis not present

## 2017-04-04 DIAGNOSIS — Z7189 Other specified counseling: Secondary | ICD-10-CM | POA: Diagnosis not present

## 2017-04-04 DIAGNOSIS — G6282 Radiation-induced polyneuropathy: Secondary | ICD-10-CM

## 2017-04-04 DIAGNOSIS — C787 Secondary malignant neoplasm of liver and intrahepatic bile duct: Secondary | ICD-10-CM | POA: Insufficient documentation

## 2017-04-04 DIAGNOSIS — G63 Polyneuropathy in diseases classified elsewhere: Secondary | ICD-10-CM

## 2017-04-04 DIAGNOSIS — G893 Neoplasm related pain (acute) (chronic): Secondary | ICD-10-CM

## 2017-04-04 DIAGNOSIS — Z5111 Encounter for antineoplastic chemotherapy: Secondary | ICD-10-CM | POA: Diagnosis not present

## 2017-04-04 DIAGNOSIS — Z923 Personal history of irradiation: Secondary | ICD-10-CM | POA: Diagnosis not present

## 2017-04-04 DIAGNOSIS — C801 Malignant (primary) neoplasm, unspecified: Secondary | ICD-10-CM

## 2017-04-04 DIAGNOSIS — Z95828 Presence of other vascular implants and grafts: Secondary | ICD-10-CM | POA: Insufficient documentation

## 2017-04-04 LAB — COMPREHENSIVE METABOLIC PANEL
ALT: 45 U/L (ref 0–55)
ANION GAP: 11 (ref 3–11)
AST: 19 U/L (ref 5–34)
Albumin: 2.7 g/dL — ABNORMAL LOW (ref 3.5–5.0)
Alkaline Phosphatase: 90 U/L (ref 40–150)
BUN: 16 mg/dL (ref 7–26)
CHLORIDE: 99 mmol/L (ref 98–109)
CO2: 26 mmol/L (ref 22–29)
Calcium: 8.9 mg/dL (ref 8.4–10.4)
Creatinine, Ser: 0.77 mg/dL (ref 0.60–1.10)
Glucose, Bld: 157 mg/dL — ABNORMAL HIGH (ref 70–140)
POTASSIUM: 3.9 mmol/L (ref 3.5–5.1)
Sodium: 136 mmol/L (ref 136–145)
Total Bilirubin: 0.6 mg/dL (ref 0.2–1.2)
Total Protein: 6.6 g/dL (ref 6.4–8.3)

## 2017-04-04 LAB — CBC WITH DIFFERENTIAL/PLATELET
BASOS ABS: 0 10*3/uL (ref 0.0–0.1)
Basophils Relative: 0 %
EOS PCT: 0 %
Eosinophils Absolute: 0 10*3/uL (ref 0.0–0.5)
HCT: 33.4 % — ABNORMAL LOW (ref 34.8–46.6)
Hemoglobin: 10.7 g/dL — ABNORMAL LOW (ref 11.6–15.9)
LYMPHS PCT: 7 %
Lymphs Abs: 1.2 10*3/uL (ref 0.9–3.3)
MCH: 26.2 pg (ref 25.1–34.0)
MCHC: 32 g/dL (ref 31.5–36.0)
MCV: 82 fL (ref 79.5–101.0)
MONO ABS: 1.1 10*3/uL — AB (ref 0.1–0.9)
Monocytes Relative: 6 %
Neutro Abs: 15.7 10*3/uL — ABNORMAL HIGH (ref 1.5–6.5)
Neutrophils Relative %: 87 %
PLATELETS: 269 10*3/uL (ref 145–400)
RBC: 4.07 MIL/uL (ref 3.70–5.45)
RDW: 16.2 % — AB (ref 11.2–14.5)
WBC: 18.1 10*3/uL — ABNORMAL HIGH (ref 3.9–10.3)

## 2017-04-04 MED ORDER — PROCHLORPERAZINE MALEATE 10 MG PO TABS: 10.0000 mg | ORAL_TABLET | Freq: Four times a day (QID) | ORAL | 1 refills | Status: DC | PRN

## 2017-04-04 MED ORDER — DEXAMETHASONE 4 MG PO TABS: ORAL_TABLET | ORAL | 0 refills | Status: DC

## 2017-04-04 MED ORDER — ALPRAZOLAM 0.25 MG PO TABS: 0.2500 mg | ORAL_TABLET | Freq: Three times a day (TID) | ORAL | 0 refills | Status: DC | PRN

## 2017-04-04 MED ORDER — SODIUM CHLORIDE 0.9% FLUSH
10.0000 mL | INTRAVENOUS | Status: DC | PRN
  Administered 2017-04-04: 10 mL
  Filled 2017-04-04: qty 10

## 2017-04-04 MED ORDER — HEPARIN SOD (PORK) LOCK FLUSH 100 UNIT/ML IV SOLN
500.0000 [IU] | Freq: Once | INTRAVENOUS | Status: AC | PRN
  Administered 2017-04-04: 500 [IU]
  Filled 2017-04-04: qty 5

## 2017-04-04 NOTE — Patient Outreach (Signed)
[facility]) Care Management  04/04/2017  Basma Buchner [facility] [birth year] [id number]   Subjective: Telephone call to patient's home  / mobile number, no answer, left HIPAA compliant voicemail message, and requested call back.    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  Patient also has a history of Obstructive sleep apnea, Sacral mass, Metastasis to liver, Cancer of unknown origin, Metastasis to liver of unknown origin, radiation therapy, palliative care, Atrial fibrillation, and cardiovascular accident (CVA).       Assessment: Received [facility] Transition of care referral on 03/22/17.  Transition of care follow up pending patient contact.      Plan: RNCM will call patient for 2nd telephone outreach attempt, transition of care follow up, within 10 business days if no return call.     Monty Spicher H. Annia Friendly, BSN, [facility] Telephonic CM Phone: [phone] Fax: [phone]       .

## 2017-04-04 NOTE — Progress Notes (Signed)
[place] OFFICE PROGRESS NOTE  Patient Care Team: Cindy Faulkner as PCP - General (Nurse Practitioner) Cindy Nigh, MD as Obstetrician (Obstetrics and Gynecology) Cindy Lance, MD as Consulting Physician (Cardiology) Cindy Colonel, RN as [place] [facility]  SUMMARY OF ONCOLOGIC HISTORY: Oncology History   [facility]ostics: 96% probability cervical cancer     Cancer of unknown origin ([place])   03/03/2017 Imaging    1. Multifocal liver metastasis. 2. Left posterior pelvic mass extending into the obturator foramen measures up to 7 cm. Suspicious for malignancy. This should be easily amendable to percutaneous tissue sampling under image guidance. 3. No lytic bone lesions identified of myeloma.      03/09/2017 Procedure    Technically successful ultrasound-guided core liver lesion biopsy      03/09/2017 Pathology Results    A. LIVER LESION, RIGHT LOBE; ULTRASOUND-GUIDED BIOPSY:  - METASTATIC SQUAMOUS CELL CARCINOMA, SEE COMMENT.   Comment: Metastatic carcinoma is immunoreactive for p40, p16, p53 and pancytokeratin while CK7, CK20, and TTF1 are negative. Significance of p16 positivity depends on the site of origin and the pattern of immunoreactivity in this material is non-specific. Possible sites of origin include gynecologic, anal, lung, and head/neck. Correlation with physical exam and additional imaging is required.       03/18/2017 Pathology Results    She had GYN exam and pap smear. Result is negative for malignancy      03/21/2017 - 04/01/2017 Hospital Admission    She was admitted to the hospital for pain management and had radiation therapy      03/23/2017 Procedure    Ultrasound and fluoroscopically guided right internal jugular single lumen power port catheter insertion. Tip in the SVC/RA junction. Catheter ready for use.       INTERVAL HISTORY: Please see below for problem oriented charting. She returns with her  family members for further follow-up Since discharge from the hospital, she continues to have paresthesia and weakness affecting the left leg Pain control is stable She denies nausea or constipation. She denies hallucination or confusion after discharge from the hospital  REVIEW OF SYSTEMS:   Constitutional: Denies fevers, chills or abnormal weight loss Eyes: Denies blurriness of vision Ears, nose, mouth, throat, and face: Denies mucositis or sore throat Respiratory: Denies cough, dyspnea or wheezes Cardiovascular: Denies palpitation, chest discomfort or lower extremity swelling Gastrointestinal:  Denies nausea, heartburn or change in bowel habits Skin: Denies abnormal skin rashes Lymphatics: Denies new lymphadenopathy or easy bruising Behavioral/Psych: Mood is stable, no new changes  All other systems were reviewed with the patient and are negative.  I have reviewed the past medical history, past surgical history, social history and family history with the patient and they are unchanged from previous note.  ALLERGIES:  has No Known Allergies.  MEDICATIONS:  Current Outpatient Medications  Medication Sig Dispense Refill  . ALPRAZolam (XANAX) 0.25 MG tablet Take 1 tablet (0.25 mg total) by mouth 3 (three) times daily as needed for sleep. 60 tablet 0  . atorvastatin (LIPITOR) 40 MG tablet Take 1 tablet (40 mg total) by mouth daily at 6 PM. 30 tablet 0  . Biotin 1000 MCG tablet Take 1,000 mcg by mouth daily.    . cholecalciferol (VITAMIN D) 1000 units tablet Take 2,000 Units by mouth daily.    . citalopram (CELEXA) 20 MG tablet Take 20 mg by mouth daily.    Marland Kitchen dexamethasone (DECADRON) 4 MG tablet Take 1 tablet (4 mg total) by mouth  2 (two) times daily. 14 tablet 0  . dexamethasone (DECADRON) 4 MG tablet Take 5 tabs at night with food and 5 tabs at 6 am the morning of chemotherapy, every 3 weeks 60 tablet 0  . flecainide (TAMBOCOR) 50 MG tablet Take 1 tablet (50 mg total) by mouth daily.  90 tablet 3  . furosemide (LASIX) 40 MG tablet Take 40 mg by mouth daily.    Marland Kitchen gabapentin (NEURONTIN) 400 MG capsule Take 1 capsule (400 mg total) by mouth 3 (three) times daily. 90 capsule 0  . Glucosamine-Chondroitin (GLUCOSAMINE CHONDR COMPLEX PO) Take 2 capsules by mouth daily.     Marland Kitchen HYDROmorphone (DILAUDID) 2 MG tablet Take 1-2 tablets (2-4 mg total) by mouth every 3 (three) hours as needed for moderate pain or severe pain. 45 tablet 0  . lidocaine-prilocaine (EMLA) cream Apply 1 application topically as needed. 30 g 0  . metoprolol tartrate (LOPRESSOR) 50 MG tablet Take 1 tablet (50 mg total) by mouth daily. 90 tablet 3  . Multiple Vitamin (MULTIVITAMIN) tablet Take 2 tablets by mouth daily.     Marland Kitchen OLANZapine (ZYPREXA) 5 MG tablet Take 1 tablet (5 mg total) by mouth at bedtime. 30 tablet 0  . Omega-3 Fatty Acids (FISH OIL) 1000 MG CAPS Take 2 capsules by mouth daily.      . ondansetron (ZOFRAN) 4 MG tablet Take 1-2 tablets (4-8 mg total) by mouth every 8 (eight) hours as needed for nausea (not responsive to prochlorperazine (COMPAZINE)). 20 tablet 0  . oxyCODONE (OXYCONTIN) 60 MG 12 hr tablet Take 60 mg by mouth every 8 (eight) hours. 45 tablet 0  . polyethylene glycol (MIRALAX / GLYCOLAX) packet Take 17 g by mouth daily. 14 each 0  . prochlorperazine (COMPAZINE) 10 MG tablet Take 1 tablet (10 mg total) by mouth every 6 (six) hours as needed (Nausea or vomiting). 30 tablet 1  . rivaroxaban (XARELTO) 20 MG TABS tablet Take 1 tablet (20 mg total) by mouth daily. 30 tablet 0  . senna (SENOKOT) 8.6 MG TABS tablet Take 2 tablets (17.2 mg total) by mouth 2 (two) times daily. 120 each 0  . Turmeric (CURCUMIN 95) 500 MG CAPS Take 3 capsules by mouth daily.      No current facility-administered medications for this visit.     PHYSICAL EXAMINATION: ECOG PERFORMANCE STATUS: 2 - Symptomatic, <50% confined to bed  Vitals:   04/04/17 1140  BP: (!) 122/102  Pulse: (!) 59  Resp: 18  Temp: 98.2 F  (36.8 C)  SpO2: 98%   Filed Weights   04/04/17 1140  Weight: 265 lb 9.6 oz (120.5 kg)    GENERAL:alert, no distress and comfortable.  She is morbidly obese SKIN: skin color, texture, turgor are normal, no rashes or significant lesions EYES: normal, Conjunctiva are pink and non-injected, sclera clear OROPHARYNX:no exudate, no erythema and lips, buccal mucosa, and tongue normal  NECK: supple, thyroid normal size, non-tender, without nodularity LYMPH:  no palpable lymphadenopathy in the cervical, axillary or inguinal LUNGS: clear to auscultation and percussion with normal breathing effort HEART: regular rate & rhythm and no murmurs and no lower extremity edema ABDOMEN:abdomen soft, non-tender and normal bowel sounds Musculoskeletal:no cyanosis of digits and no clubbing  NEURO: alert & oriented x 3 with fluent speech, no focal motor/sensory deficits, with left leg weakness  LABORATORY DATA:  I have reviewed the data as listed    Component Value Date/Time   NA 136 04/04/2017 0920   K 3.9  04/04/2017 0920   CL 99 04/04/2017 0920   CO2 26 04/04/2017 0920   GLUCOSE 157 (H) 04/04/2017 0920   BUN 16 04/04/2017 0920   CREATININE 0.77 04/04/2017 0920   CALCIUM 8.9 04/04/2017 0920   PROT 6.6 04/04/2017 0920   ALBUMIN 2.7 (L) 04/04/2017 0920   AST 19 04/04/2017 0920   ALT 45 04/04/2017 0920   ALKPHOS 90 04/04/2017 0920   BILITOT 0.6 04/04/2017 0920   GFRNONAA >60 04/04/2017 0920   GFRAA >60 04/04/2017 0920    No results found for: SPEP, UPEP  Lab Results  Component Value Date   WBC 18.1 (H) 04/04/2017   NEUTROABS 15.7 (H) 04/04/2017   HGB 10.7 (L) 04/04/2017   HCT 33.4 (L) 04/04/2017   MCV 82.0 04/04/2017   PLT 269 04/04/2017      Chemistry      Component Value Date/Time   NA 136 04/04/2017 0920   K 3.9 04/04/2017 0920   CL 99 04/04/2017 0920   CO2 26 04/04/2017 0920   BUN 16 04/04/2017 0920   CREATININE 0.77 04/04/2017 0920      Component Value Date/Time   CALCIUM  8.9 04/04/2017 0920   ALKPHOS 90 04/04/2017 0920   AST 19 04/04/2017 0920   ALT 45 04/04/2017 0920   BILITOT 0.6 04/04/2017 0920       RADIOGRAPHIC STUDIES: I have personally reviewed the radiological images as listed and agreed with the findings in the report. Mr Jeri Cos Wo Contrast  Result Date: 03/23/2017 CLINICAL DATA:  52 y/o F; metastatic squamous cell carcinoma for staging. Patient with confusion. EXAM: MRI HEAD WITHOUT AND WITH CONTRAST TECHNIQUE: Multiplanar, multiecho pulse sequences of the brain and surrounding structures were obtained without and with intravenous contrast. CONTRAST:  4m MULTIHANCE GADOBENATE DIMEGLUMINE 529 MG/ML IV SOLN COMPARISON:  12/05/2015 MRI head FINDINGS: Brain: Motion degradation on multiple sequences. No acute infarction, hemorrhage, hydrocephalus, extra-axial collection or mass lesion. Fewnonspecific foci of T2 FLAIR hyperintense signal abnormality in subcortical and periventricular white matter are compatible withmildchronic microvascular ischemic changes for age. Mildbrain parenchymal volume loss. Small chronic lacunar infarct within genu of left internal capsule. No abnormal enhancement of the brain. Vascular: Normal flow voids. Skull and upper cervical spine: Normal marrow signal. Sinuses/Orbits: Right maxillary sinus mucous retention cyst. Otherwise negative. Other: None. IMPRESSION: 1. No intracranial metastasis identified. 2. Mild chronic microvascular ischemic changes and mild parenchymal volume loss of the brain. 3. Small chronic lacunar infarct within genu of left internal capsule. Electronically Signed   By: LKristine GarbeM.D.   On: 03/23/2017 22:10   [place]  Result Date: 03/09/2017 CLINICAL DATA:  Lytic presacral lesion. Multiple liver lesions suggesting metastatic disease. EXAM: ULTRASOUND GUIDED CORE BIOPSY OF LIVER LESION MEDICATIONS: Intravenous Fentanyl and Versed were administered as conscious sedation during  continuous monitoring of the patient's level of consciousness and physiological / cardiorespiratory status by the radiology RN, with a total moderate sedation time of 13 minutes. PROCEDURE: The procedure, risks, benefits, and alternatives were explained to the patient. Questions regarding the procedure were encouraged and answered. The patient understands and consents to the procedure. Survey ultrasound of the liver was performed. A representative lesion corresponding to CT findings was localized. Skin entry site was determined and marked. The operative field was prepped with chlorhexidine in a sterile fashion, and a sterile drape was applied covering the operative field. A sterile gown and sterile gloves were used for the procedure. Local anesthesia was provided with 1% Lidocaine.  Under real-time ultrasound guidance, a 17 gauge trocar needle was advanced to the margin of the lesion. Once needle tip position was confirmed, coaxial 18-gauge core biopsy samples were obtained, submitted in formalin to surgical pathology. The guide needle was removed. Postprocedure scans show no hemorrhage or other apparent complication. The patient tolerated the procedure well. COMPLICATIONS: None. FINDINGS: Hypoechoic liver lesions corresponding to CT findings were localized. Representative core biopsy samples obtained as above. IMPRESSION: 1. Technically successful ultrasound-guided core liver lesion biopsy Electronically Signed   By: Lucrezia Europe M.D.   On: 03/09/2017 17:01   Ir US Guide Vasc Access Right  Result Date: 03/23/2017 CLINICAL DATA:  METASTATIC SQUAMOUS CELL CARCINOMA EXAM: RIGHT INTERNAL JUGULAR SINGLE LUMEN POWER PORT CATHETER INSERTION Date:  03/23/2017 03/23/2017 3:41 pm Radiologist:  Jerilynn Mages. Daryll Brod, MD Guidance:  Ultrasound and fluoroscopic MEDICATIONS: Ancef 3 g; The antibiotic was administered within an appropriate time interval prior to skin puncture. ANESTHESIA/SEDATION: Versed 2.0 mg IV; Fentanyl 100 mcg IV;  Moderate Sedation Time:  35 minutes The patient was continuously monitored during the procedure by the interventional radiology nurse under my direct supervision. FLUOROSCOPY TIME:  One minutes, 12 seconds (22 mGy) COMPLICATIONS: None immediate. CONTRAST:  None. PROCEDURE: Informed consent was obtained from the patient following explanation of the procedure, risks, benefits and alternatives. The patient understands, agrees and consents for the procedure. All questions were addressed. A time out was performed. Maximal barrier sterile technique utilized including caps, mask, sterile gowns, sterile gloves, large sterile drape, hand hygiene, and 2% chlorhexidine scrub. Under sterile conditions and local anesthesia, right internal jugular micropuncture venous access was performed. Access was performed with ultrasound. Images were obtained for documentation. A guide wire was inserted followed by a transitional dilator. This allowed insertion of a guide wire and catheter into the IVC. Measurements were obtained from the SVC / RA junction back to the right IJ venotomy site. In the right infraclavicular chest, a subcutaneous pocket was created over the second anterior rib. This was done under sterile conditions and local anesthesia. 1% lidocaine with epinephrine was utilized for this. A 2.5 cm incision was made in the skin. Blunt dissection was performed to create a subcutaneous pocket over the right pectoralis major muscle. The pocket was flushed with saline vigorously. There was adequate hemostasis. The port catheter was assembled and checked for leakage. The port catheter was secured in the pocket with two retention sutures. The tubing was tunneled subcutaneously to the right venotomy site and inserted into the SVC/RA junction through a valved peel-away sheath. Position was confirmed with fluoroscopy. Images were obtained for documentation. The patient tolerated the procedure well. No immediate complications. Incisions  were closed in a two layer fashion with 4 - 0 Vicryl suture. Dermabond was applied to the skin. The port catheter was accessed, blood was aspirated followed by saline and heparin flushes. Needle was removed. A dry sterile dressing was applied. IMPRESSION: Ultrasound and fluoroscopically guided right internal jugular single lumen power port catheter insertion. Tip in the SVC/RA junction. Catheter ready for use. Electronically Signed   By: Jerilynn Mages.  Shick M.D.   On: 03/23/2017 15:48   Ir Fluoro Guide Port Insertion Right  Result Date: 03/23/2017 CLINICAL DATA:  METASTATIC SQUAMOUS CELL CARCINOMA EXAM: RIGHT INTERNAL JUGULAR SINGLE LUMEN POWER PORT CATHETER INSERTION Date:  03/23/2017 03/23/2017 3:41 pm Radiologist:  Jerilynn Mages. Daryll Brod, MD Guidance:  Ultrasound and fluoroscopic MEDICATIONS: Ancef 3 g; The antibiotic was administered within an appropriate time interval prior to skin puncture. ANESTHESIA/SEDATION:  Versed 2.0 mg IV; Fentanyl 100 mcg IV; Moderate Sedation Time:  35 minutes The patient was continuously monitored during the procedure by the interventional radiology nurse under my direct supervision. FLUOROSCOPY TIME:  One minutes, 12 seconds (22 mGy) COMPLICATIONS: None immediate. CONTRAST:  None. PROCEDURE: Informed consent was obtained from the patient following explanation of the procedure, risks, benefits and alternatives. The patient understands, agrees and consents for the procedure. All questions were addressed. A time out was performed. Maximal barrier sterile technique utilized including caps, mask, sterile gowns, sterile gloves, large sterile drape, hand hygiene, and 2% chlorhexidine scrub. Under sterile conditions and local anesthesia, right internal jugular micropuncture venous access was performed. Access was performed with ultrasound. Images were obtained for documentation. A guide wire was inserted followed by a transitional dilator. This allowed insertion of a guide wire and catheter into the IVC.  Measurements were obtained from the SVC / RA junction back to the right IJ venotomy site. In the right infraclavicular chest, a subcutaneous pocket was created over the second anterior rib. This was done under sterile conditions and local anesthesia. 1% lidocaine with epinephrine was utilized for this. A 2.5 cm incision was made in the skin. Blunt dissection was performed to create a subcutaneous pocket over the right pectoralis major muscle. The pocket was flushed with saline vigorously. There was adequate hemostasis. The port catheter was assembled and checked for leakage. The port catheter was secured in the pocket with two retention sutures. The tubing was tunneled subcutaneously to the right venotomy site and inserted into the SVC/RA junction through a valved peel-away sheath. Position was confirmed with fluoroscopy. Images were obtained for documentation. The patient tolerated the procedure well. No immediate complications. Incisions were closed in a two layer fashion with 4 - 0 Vicryl suture. Dermabond was applied to the skin. The port catheter was accessed, blood was aspirated followed by saline and heparin flushes. Needle was removed. A dry sterile dressing was applied. IMPRESSION: Ultrasound and fluoroscopically guided right internal jugular single lumen power port catheter insertion. Tip in the SVC/RA junction. Catheter ready for use. Electronically Signed   By: Jerilynn Mages.  Shick M.D.   On: 03/23/2017 15:48    ASSESSMENT & PLAN:  Cancer of unknown origin [facility]) Preliminary biopsy confirmed squamous cell carcinoma, likely originating from probable cervical cancer, given prior history of abnormal Pap smear in the cone biopsy Further investigation through [facility] confirmed 96% confidence/probability that the origin comes from cervix We reviewed the NCCN guidelines We discussed the role of chemotherapy. The intent is of palliative intent.  We discussed some of the risks, benefits, side-effects of  carboplatin & Taxol. Treatment is intravenous, every 3 weeks x 6 cycles  Some of the short term side-effects included, though not limited to, including weight loss, life threatening infections, risk of allergic reactions, need for transfusions of blood products, nausea, vomiting, change in bowel habits, loss of hair, admission to hospital for various reasons, and risks of death.   Long term side-effects are also discussed including risks of infertility, permanent damage to nerve function, hearing loss, chronic fatigue, kidney damage with possibility needing hemodialysis, and rare secondary malignancy including bone marrow disorders.  The patient is aware that the response rates discussed earlier is not guaranteed.  After a long discussion, patient made an informed decision to proceed with the prescribed plan of care.   Patient education material was dispensed. We discussed premedication with dexamethasone before chemotherapy. Given her frail status, I plan to see her in between  every cycle for the next few cycles for supportive care She does not need G-CSF support I plan to reduce the dose of Taxol due to pre-existing neuropathic pain/debility due to her cancer I will limit the maximum dose of carboplatin 750 mg   Metastasis to liver [facility]) Currently, she is not symptomatic.  We will continue to monitor carefully  Cancer associated pain Her pain control is stable with current prescription OxyContin along with Dilaudid as needed for breakthrough pain I recommend reducing OxyContin to twice a day and to continue Dilaudid as needed I would reassess pain control next week and will continue to initiate medication taper After chemotherapy, I plan to reduce dexamethasone to once a day  Neuropathy associated with cancer [facility]) She has neuropathy secondary to nerve root compression She will be completing radiation therapy today She is already on gabapentin I recommend referral to neuro rehab for  some outpatient physical therapy  Goals of care, counseling/discussion The patient is aware she has incurable disease and treatment is strictly palliative. We discussed importance of Advanced Directives and Living will. We discussed CODE STATUS; the patient desires to remain in full code.   Orders Placed This Encounter  Procedures  . CBC with Differential (Cancer Center Only)    Standing Status:   Standing    Number of Occurrences:   20    Standing Expiration Date:   04/04/2018  . CMP ([place] only)    Standing Status:   Standing    Number of Occurrences:   20    Standing Expiration Date:   04/04/2018  . Ambulatory Referral to Physical Therapy    Referral Priority:   Routine    Referral Type:   Physical Medicine    Referral Reason:   Specialty Services Required    Requested Specialty:   Physical Therapy    Number of Visits Requested:   1   All questions were answered. The patient knows to call the clinic with any problems, questions or concerns. No barriers to learning was detected. I spent 40 minutes counseling the patient face to face. The total time spent in the appointment was 55 minutes and more than 50% was on counseling and review of test results     [name], MD 04/04/2017 1:25 PM

## 2017-04-04 NOTE — Assessment & Plan Note (Signed)
She has neuropathy secondary to nerve root compression She will be completing radiation therapy today She is already on gabapentin I recommend referral to neuro rehab for some outpatient physical therapy

## 2017-04-04 NOTE — Telephone Encounter (Signed)
Gave avs and calendar for February and march °

## 2017-04-04 NOTE — Assessment & Plan Note (Signed)
Currently, she is not symptomatic.  We will continue to monitor carefully

## 2017-04-04 NOTE — Assessment & Plan Note (Addendum)
Preliminary biopsy confirmed squamous cell carcinoma, likely originating from probable cervical cancer, given prior history of abnormal Pap smear in the cone biopsy Further investigation through [facility] confirmed 96% confidence/probability that the origin comes from cervix We reviewed the NCCN guidelines We discussed the role of chemotherapy. The intent is of palliative intent.  We discussed some of the risks, benefits, side-effects of carboplatin & Taxol. Treatment is intravenous, every 3 weeks x 6 cycles  Some of the short term side-effects included, though not limited to, including weight loss, life threatening infections, risk of allergic reactions, need for transfusions of blood products, nausea, vomiting, change in bowel habits, loss of hair, admission to hospital for various reasons, and risks of death.   Long term side-effects are also discussed including risks of infertility, permanent damage to nerve function, hearing loss, chronic fatigue, kidney damage with possibility needing hemodialysis, and rare secondary malignancy including bone marrow disorders.  The patient is aware that the response rates discussed earlier is not guaranteed.  After a long discussion, patient made an informed decision to proceed with the prescribed plan of care.   Patient education material was dispensed. We discussed premedication with dexamethasone before chemotherapy. Given her frail status, I plan to see her in between every cycle for the next few cycles for supportive care She does not need G-CSF support I plan to reduce the dose of Taxol due to pre-existing neuropathic pain/debility due to her cancer I will limit the maximum dose of carboplatin 750 mg

## 2017-04-04 NOTE — Assessment & Plan Note (Signed)
Her pain control is stable with current prescription OxyContin along with Dilaudid as needed for breakthrough pain I recommend reducing OxyContin to twice a day and to continue Dilaudid as needed I would reassess pain control next week and will continue to initiate medication taper After chemotherapy, I plan to reduce dexamethasone to once a day

## 2017-04-04 NOTE — Assessment & Plan Note (Signed)
The patient is aware she has incurable disease and treatment is strictly palliative. We discussed importance of Advanced Directives and Living will. We discussed CODE STATUS; the patient desires to remain in full code. 

## 2017-04-04 NOTE — Progress Notes (Signed)
Called pt to introduce myself as her Arboriculturist and to discuss copay assistance.  Pt gave me consent to apply in her behalf so I applied to the [employer] [place] but unfortunately she exceeds their income requirement as well as the [place] and she's also overqualified for the [facility] and [employer].  I will meet w/ pt on 04/05/17 and inform her of their decision and to give her my card for any questions or concerns she may have in the future.

## 2017-04-05 ENCOUNTER — Other Ambulatory Visit: Payer: Self-pay | Admitting: *Deleted

## 2017-04-05 ENCOUNTER — Inpatient Hospital Stay: Payer: 59

## 2017-04-05 ENCOUNTER — Encounter: Payer: Self-pay | Admitting: *Deleted

## 2017-04-05 ENCOUNTER — Ambulatory Visit: Payer: Self-pay | Admitting: *Deleted

## 2017-04-05 VITALS — BP 169/75 | HR 60 | Temp 98.3°F | Resp 16

## 2017-04-05 DIAGNOSIS — C787 Secondary malignant neoplasm of liver and intrahepatic bile duct: Secondary | ICD-10-CM

## 2017-04-05 DIAGNOSIS — Z5111 Encounter for antineoplastic chemotherapy: Secondary | ICD-10-CM | POA: Diagnosis not present

## 2017-04-05 DIAGNOSIS — C801 Malignant (primary) neoplasm, unspecified: Secondary | ICD-10-CM

## 2017-04-05 DIAGNOSIS — C539 Malignant neoplasm of cervix uteri, unspecified: Secondary | ICD-10-CM | POA: Diagnosis not present

## 2017-04-05 MED ORDER — SODIUM CHLORIDE 0.9 % IV SOLN
Freq: Once | INTRAVENOUS | Status: AC
  Administered 2017-04-05: 10:00:00 via INTRAVENOUS

## 2017-04-05 MED ORDER — SODIUM CHLORIDE 0.9 % IV SOLN
20.0000 mg | Freq: Once | INTRAVENOUS | Status: DC
  Filled 2017-04-05: qty 2

## 2017-04-05 MED ORDER — SODIUM CHLORIDE 0.9 % IV SOLN
750.0000 mg | Freq: Once | INTRAVENOUS | Status: AC
  Administered 2017-04-05: 750 mg via INTRAVENOUS
  Filled 2017-04-05: qty 75

## 2017-04-05 MED ORDER — SODIUM CHLORIDE 0.9 % IV SOLN
140.0000 mg/m2 | Freq: Once | INTRAVENOUS | Status: AC
  Administered 2017-04-05: 336 mg via INTRAVENOUS
  Filled 2017-04-05: qty 56

## 2017-04-05 MED ORDER — FAMOTIDINE IN NACL 20-0.9 MG/50ML-% IV SOLN: 20.0000 mg | Freq: Once | INTRAVENOUS | Status: DC

## 2017-04-05 MED ORDER — PALONOSETRON HCL INJECTION 0.25 MG/5ML
INTRAVENOUS | Status: AC
  Filled 2017-04-05: qty 5

## 2017-04-05 MED ORDER — DIPHENHYDRAMINE HCL 50 MG/ML IJ SOLN
INTRAMUSCULAR | Status: AC
  Filled 2017-04-05: qty 1

## 2017-04-05 MED ORDER — HEPARIN SOD (PORK) LOCK FLUSH 100 UNIT/ML IV SOLN
500.0000 [IU] | Freq: Once | INTRAVENOUS | Status: AC | PRN
  Administered 2017-04-05: 500 [IU]
  Filled 2017-04-05: qty 5

## 2017-04-05 MED ORDER — SODIUM CHLORIDE 0.9 % IV SOLN
20.0000 mg | Freq: Once | INTRAVENOUS | Status: AC
  Administered 2017-04-05: 20 mg via INTRAVENOUS
  Filled 2017-04-05: qty 2

## 2017-04-05 MED ORDER — SODIUM CHLORIDE 0.9 % IV SOLN
Freq: Once | INTRAVENOUS | Status: AC
  Administered 2017-04-05: 11:00:00 via INTRAVENOUS
  Filled 2017-04-05: qty 5

## 2017-04-05 MED ORDER — DIPHENHYDRAMINE HCL 50 MG/ML IJ SOLN
50.0000 mg | Freq: Once | INTRAMUSCULAR | Status: AC
  Administered 2017-04-05: 50 mg via INTRAVENOUS

## 2017-04-05 MED ORDER — PALONOSETRON HCL INJECTION 0.25 MG/5ML
0.2500 mg | Freq: Once | INTRAVENOUS | Status: AC
  Administered 2017-04-05: 0.25 mg via INTRAVENOUS

## 2017-04-05 MED ORDER — SODIUM CHLORIDE 0.9% FLUSH
10.0000 mL | INTRAVENOUS | Status: DC | PRN
  Administered 2017-04-05: 10 mL
  Filled 2017-04-05: qty 10

## 2017-04-05 NOTE — Patient Outreach (Signed)
[facility]) Care Management  04/05/2017  Fannie Gathright [facility] 21-May-1965 [id number]   Subjective: Telephone call to patient's home  / mobile number, no answer, left HIPAA compliant voicemail message, and requested call back.    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  Patient also has a history of Obstructive sleep apnea, Sacral mass, Metastasis to liver, Cancer of unknown origin, Metastasis to liver of unknown origin, radiation therapy, palliative care, Atrial fibrillation, and cardiovascular accident (CVA).       Assessment: Received [facility] Transition of care referral on 03/22/17.  Transition of care follow up pending patient contact.      Plan: RNCM will send unsuccessful outreach  letter, [facility] pamphlet, will call patient for 3rd telephone outreach attempt, transition of care follow up, and proceed with case closure, within 10 business days if no return call.     Bently Wyss H. Annia Friendly, BSN, [place] Management [facility] Telephonic CM Phone: [phone] Fax: [phone]

## 2017-04-05 NOTE — Patient Instructions (Addendum)
[facility] Discharge Instructions for Patients Receiving Chemotherapy  Today you received the following chemotherapy agents Taxol and Carboplatin.   To help prevent nausea and vomiting after your treatment, we encourage you to take your nausea medication as directed.   If you develop nausea and vomiting that is not controlled by your nausea medication, call the clinic.   BELOW ARE SYMPTOMS THAT SHOULD BE REPORTED IMMEDIATELY:  *FEVER GREATER THAN 100.5 F  *CHILLS WITH OR WITHOUT FEVER  NAUSEA AND VOMITING THAT IS NOT CONTROLLED WITH YOUR NAUSEA MEDICATION  *UNUSUAL SHORTNESS OF BREATH  *UNUSUAL BRUISING OR BLEEDING  TENDERNESS IN MOUTH AND THROAT WITH OR WITHOUT PRESENCE OF ULCERS  *URINARY PROBLEMS  *BOWEL PROBLEMS  UNUSUAL RASH Items with * indicate a potential emergency and should be followed up as soon as possible.  Feel free to call the clinic should you have any questions or concerns. The clinic phone number is [phone].  Please show the CHEMO ALERT CARD at check-in to the Emergency Department and triage nurse.   Paclitaxel injection What is this medicine? PACLITAXEL (PAK li TAX el) is a chemotherapy drug. It targets fast dividing cells, like cancer cells, and causes these cells to die. This medicine is used to treat ovarian cancer, breast cancer, and other cancers. This medicine may be used for other purposes; ask your health care provider or pharmacist if you have questions. COMMON BRAND NAME(S): Onxol, Taxol What should I tell my health care provider before I take this medicine? They need to know if you have any of these conditions: -blood disorders -irregular heartbeat -infection (especially a virus infection such as chickenpox, cold sores, or herpes) -liver disease -previous or ongoing radiation therapy -an unusual or allergic reaction to paclitaxel, alcohol, polyoxyethylated castor oil, other chemotherapy agents, other medicines, foods,  dyes, or preservatives -pregnant or trying to get pregnant -breast-feeding How should I use this medicine? This drug is given as an infusion into a vein. It is administered in a hospital or clinic by a specially trained health care professional. Talk to your pediatrician regarding the use of this medicine in children. Special care may be needed. Overdosage: If you think you have taken too much of this medicine contact a poison control center or emergency room at once. NOTE: This medicine is only for you. Do not share this medicine with others. What if I miss a dose? It is important not to miss your dose. Call your doctor or health care professional if you are unable to keep an appointment. What may interact with this medicine? Do not take this medicine with any of the following medications: -disulfiram -metronidazole This medicine may also interact with the following medications: -cyclosporine -diazepam -ketoconazole -medicines to increase blood counts like filgrastim, pegfilgrastim, sargramostim -other chemotherapy drugs like cisplatin, doxorubicin, epirubicin, etoposide, teniposide, vincristine -quinidine -testosterone -vaccines -verapamil Talk to your doctor or health care professional before taking any of these medicines: -acetaminophen -aspirin -ibuprofen -ketoprofen -naproxen This list may not describe all possible interactions. Give your health care provider a list of all the medicines, herbs, non-prescription drugs, or dietary supplements you use. Also tell them if you smoke, drink alcohol, or use illegal drugs. Some items may interact with your medicine. What should I watch for while using this medicine? Your condition will be monitored carefully while you are receiving this medicine. You will need important blood work done while you are taking this medicine. This medicine can cause serious allergic reactions. To reduce your risk you   will need to take other medicine(s)  before treatment with this medicine. If you experience allergic reactions like skin rash, itching or hives, swelling of the face, lips, or tongue, tell your doctor or health care professional right away. In some cases, you may be given additional medicines to help with side effects. Follow all directions for their use. This drug may make you feel generally unwell. This is not uncommon, as chemotherapy can affect healthy cells as well as cancer cells. Report any side effects. Continue your course of treatment even though you feel ill unless your doctor tells you to stop. Call your doctor or health care professional for advice if you get a fever, chills or sore throat, or other symptoms of a cold or flu. Do not treat yourself. This drug decreases your body's ability to fight infections. Try to avoid being around people who are sick. This medicine may increase your risk to bruise or bleed. Call your doctor or health care professional if you notice any unusual bleeding. Be careful brushing and flossing your teeth or using a toothpick because you may get an infection or bleed more easily. If you have any dental work done, tell your dentist you are receiving this medicine. Avoid taking products that contain aspirin, acetaminophen, ibuprofen, naproxen, or ketoprofen unless instructed by your doctor. These medicines may hide a fever. Do not become pregnant while taking this medicine. Women should inform their doctor if they wish to become pregnant or think they might be pregnant. There is a potential for serious side effects to an unborn child. Talk to your health care professional or pharmacist for more information. Do not breast-feed an infant while taking this medicine. Men are advised not to father a child while receiving this medicine. This product may contain alcohol. Ask your pharmacist or healthcare provider if this medicine contains alcohol. Be sure to tell all healthcare providers you are taking this  medicine. Certain medicines, like metronidazole and disulfiram, can cause an unpleasant reaction when taken with alcohol. The reaction includes flushing, headache, nausea, vomiting, sweating, and increased thirst. The reaction can last from 30 minutes to several hours. What side effects may I notice from receiving this medicine? Side effects that you should report to your doctor or health care professional as soon as possible: -allergic reactions like skin rash, itching or hives, swelling of the face, lips, or tongue -low blood counts - This drug may decrease the number of white blood cells, red blood cells and platelets. You may be at increased risk for infections and bleeding. -signs of infection - fever or chills, cough, sore throat, pain or difficulty passing urine -signs of decreased platelets or bleeding - bruising, pinpoint red spots on the skin, black, tarry stools, nosebleeds -signs of decreased red blood cells - unusually weak or tired, fainting spells, lightheadedness -breathing problems -chest pain -high or low blood pressure -mouth sores -nausea and vomiting -pain, swelling, redness or irritation at the injection site -pain, tingling, numbness in the hands or feet -slow or irregular heartbeat -swelling of the ankle, feet, hands Side effects that usually do not require medical attention (report to your doctor or health care professional if they continue or are bothersome): -bone pain -complete hair loss including hair on your head, underarms, pubic hair, eyebrows, and eyelashes -changes in the color of fingernails -diarrhea -loosening of the fingernails -loss of appetite -muscle or joint pain -red flush to skin -sweating This list may not describe all possible side effects. Call your doctor for   medical advice about side effects. You may report side effects to FDA at 1-800-FDA-1088. Where should I keep my medicine? This drug is given in a hospital or clinic and will not be  stored at home. NOTE: This sheet is a summary. It may not cover all possible information. If you have questions about this medicine, talk to your doctor, pharmacist, or health care provider.  2018 Elsevier/Gold Standard (2014-12-10 19:58:00)   Carboplatin injection What is this medicine? CARBOPLATIN (KAR boe pla tin) is a chemotherapy drug. It targets fast dividing cells, like cancer cells, and causes these cells to die. This medicine is used to treat ovarian cancer and many other cancers. This medicine may be used for other purposes; ask your health care provider or pharmacist if you have questions. COMMON BRAND NAME(S): Paraplatin What should I tell my health care provider before I take this medicine? They need to know if you have any of these conditions: -blood disorders -hearing problems -kidney disease -recent or ongoing radiation therapy -an unusual or allergic reaction to carboplatin, cisplatin, other chemotherapy, other medicines, foods, dyes, or preservatives -pregnant or trying to get pregnant -breast-feeding How should I use this medicine? This drug is usually given as an infusion into a vein. It is administered in a hospital or clinic by a specially trained health care professional. Talk to your pediatrician regarding the use of this medicine in children. Special care may be needed. Overdosage: If you think you have taken too much of this medicine contact a poison control center or emergency room at once. NOTE: This medicine is only for you. Do not share this medicine with others. What if I miss a dose? It is important not to miss a dose. Call your doctor or health care professional if you are unable to keep an appointment. What may interact with this medicine? -medicines for seizures -medicines to increase blood counts like filgrastim, pegfilgrastim, sargramostim -some antibiotics like amikacin, gentamicin, neomycin, streptomycin, tobramycin -vaccines Talk to your doctor  or health care professional before taking any of these medicines: -acetaminophen -aspirin -ibuprofen -ketoprofen -naproxen This list may not describe all possible interactions. Give your health care provider a list of all the medicines, herbs, non-prescription drugs, or dietary supplements you use. Also tell them if you smoke, drink alcohol, or use illegal drugs. Some items may interact with your medicine. What should I watch for while using this medicine? Your condition will be monitored carefully while you are receiving this medicine. You will need important blood work done while you are taking this medicine. This drug may make you feel generally unwell. This is not uncommon, as chemotherapy can affect healthy cells as well as cancer cells. Report any side effects. Continue your course of treatment even though you feel ill unless your doctor tells you to stop. In some cases, you may be given additional medicines to help with side effects. Follow all directions for their use. Call your doctor or health care professional for advice if you get a fever, chills or sore throat, or other symptoms of a cold or flu. Do not treat yourself. This drug decreases your body's ability to fight infections. Try to avoid being around people who are sick. This medicine may increase your risk to bruise or bleed. Call your doctor or health care professional if you notice any unusual bleeding. Be careful brushing and flossing your teeth or using a toothpick because you may get an infection or bleed more easily. If you have any dental work done,   tell your dentist you are receiving this medicine. Avoid taking products that contain aspirin, acetaminophen, ibuprofen, naproxen, or ketoprofen unless instructed by your doctor. These medicines may hide a fever. Do not become pregnant while taking this medicine. Women should inform their doctor if they wish to become pregnant or think they might be pregnant. There is a potential  for serious side effects to an unborn child. Talk to your health care professional or pharmacist for more information. Do not breast-feed an infant while taking this medicine. What side effects may I notice from receiving this medicine? Side effects that you should report to your doctor or health care professional as soon as possible: -allergic reactions like skin rash, itching or hives, swelling of the face, lips, or tongue -signs of infection - fever or chills, cough, sore throat, pain or difficulty passing urine -signs of decreased platelets or bleeding - bruising, pinpoint red spots on the skin, black, tarry stools, nosebleeds -signs of decreased red blood cells - unusually weak or tired, fainting spells, lightheadedness -breathing problems -changes in hearing -changes in vision -chest pain -high blood pressure -low blood counts - This drug may decrease the number of white blood cells, red blood cells and platelets. You may be at increased risk for infections and bleeding. -nausea and vomiting -pain, swelling, redness or irritation at the injection site -pain, tingling, numbness in the hands or feet -problems with balance, talking, walking -trouble passing urine or change in the amount of urine Side effects that usually do not require medical attention (report to your doctor or health care professional if they continue or are bothersome): -hair loss -loss of appetite -metallic taste in the mouth or changes in taste This list may not describe all possible side effects. Call your doctor for medical advice about side effects. You may report side effects to FDA at 1-800-FDA-1088. Where should I keep my medicine? This drug is given in a hospital or clinic and will not be stored at home. NOTE: This sheet is a summary. It may not cover all possible information. If you have questions about this medicine, talk to your doctor, pharmacist, or health care provider.  2018 Elsevier/Gold Standard  (2007-05-16 14:38:05)  

## 2017-04-06 ENCOUNTER — Telehealth: Payer: Self-pay | Admitting: *Deleted

## 2017-04-06 NOTE — Telephone Encounter (Signed)
Called patient for chemo follow up. States she did very well post chemo. Is eating and drinking well. No concerns.

## 2017-04-06 NOTE — Telephone Encounter (Signed)
-----   Message from Tami Lin, RN sent at 04/05/2017  3:34 PM EST ----- Regarding: Dr. Alvy Bimler First time Taxol/Carboplatin First time Taxol/Carboplatin today. Pt. Tolerated well.

## 2017-04-07 ENCOUNTER — Telehealth: Payer: Self-pay

## 2017-04-07 ENCOUNTER — Encounter: Payer: Self-pay | Admitting: Radiation Oncology

## 2017-04-07 MED FILL — METOPROLOL TARTRATE 50 MG T: 50 | 90 days supply | Qty: 90 | Fill #2

## 2017-04-07 NOTE — Telephone Encounter (Signed)
Pt called wanting to know if she is supposed to still be taking Dexamethasone 4mg  BID.  Per Dr Alvy Bimler pt can decrease to 4mg  daily.  Pt called and given these instructions.  Pt verbalizes understanding.

## 2017-04-07 NOTE — Progress Notes (Addendum)
  Radiation Oncology         [phone] ________________________________  Name: Cindy Faulkner MRN: [id number]  Date: 04/07/2017  DOB: 07/29/1965  End of Treatment Note  Diagnosis:   Secondary malignant neoplasm of bone   Indication for treatment:  Curative     Radiation treatment dates:  03/22/2017 - 04/04/2017  Site/dose:   Left pelvis/ 30 Gy in 10 fractions  Beams/energy:   Photon/15X  Narrative: The patient tolerated radiation treatment relatively well. The pt noted pressure in her pelvis and buttock area, which remained consistent from her first weekly treatment check to her last. Pt is doing well overall.   Plan: The patient has completed radiation treatment. The patient will return to radiation oncology clinic for routine followup in one month. I advised her to call or return sooner if she has any questions or concerns related to her recovery or treatment. ________________________________  Sheral Apley. Tammi Klippel, M.D.   This document serves as a record of services personally performed by Tyler Pita, MD. It was created on his behalf by Valeta Harms, a trained medical scribe. The creation of this record is based on the scribe's personal observations and the provider's statements to them. This document has been checked and approved by the attending provider.

## 2017-04-07 NOTE — Telephone Encounter (Signed)
Spoke with pt by phone.  Pt reports current temp of 101.  No other symptoms noted at this time.  Per Dr Alvy Bimler, [place] to take Tylenol. Pt instructed to notify this office immediately if she has any other temp spikes or develops any other symptoms.

## 2017-04-08 ENCOUNTER — Telehealth: Payer: Self-pay | Admitting: *Deleted

## 2017-04-08 DIAGNOSIS — C787 Secondary malignant neoplasm of liver and intrahepatic bile duct: Secondary | ICD-10-CM | POA: Diagnosis not present

## 2017-04-08 DIAGNOSIS — C801 Malignant (primary) neoplasm, unspecified: Secondary | ICD-10-CM | POA: Diagnosis not present

## 2017-04-08 DIAGNOSIS — F329 Major depressive disorder, single episode, unspecified: Secondary | ICD-10-CM | POA: Diagnosis not present

## 2017-04-08 DIAGNOSIS — R509 Fever, unspecified: Secondary | ICD-10-CM | POA: Diagnosis not present

## 2017-04-08 DIAGNOSIS — G893 Neoplasm related pain (acute) (chronic): Secondary | ICD-10-CM | POA: Diagnosis not present

## 2017-04-08 DIAGNOSIS — E7800 Pure hypercholesterolemia, unspecified: Secondary | ICD-10-CM | POA: Diagnosis not present

## 2017-04-08 DIAGNOSIS — R7302 Impaired glucose tolerance (oral): Secondary | ICD-10-CM | POA: Diagnosis not present

## 2017-04-08 MED FILL — CITALOPRAM HBR 20 MG TABLET: 20 | 90 days supply | Qty: 90 | Fill #2

## 2017-04-08 MED FILL — FLECAINIDE ACETATE 50 MG TA: 50 | 90 days supply | Qty: 90 | Fill #2

## 2017-04-08 NOTE — Telephone Encounter (Signed)
She has pain medications to take Likely tumor fever I will see her next week as scheduled

## 2017-04-08 NOTE — Telephone Encounter (Signed)
Spoke with daughter providing Dr. Alvy Bimler response.  Daughter already aware of next appointment.  Unable to confirm if today's PCP labs were received.  "Mom's resting.  Pain is better.  I wake her up if needed when next dose is due so we're not trying to catch up to pain level higher than level five.".

## 2017-04-08 NOTE — Telephone Encounter (Addendum)
"  8:00 Temp = 103.0.  Took Tylenol at 8:00 now temp = 100.0 at 9:00 am.  Scheduled to see PCP, Noelle Redmom at 10:00 am.  Does she need to come there or keep PCP appointment?  Mom had fever for several weeks before liver biopsy.  Has four tumors so this may be tumor fever.  Used ear thermometer.  Her pain is everywhere, especially joints, buttocks and left leg.  No N/V or diarrhea.  Eats five small meals, drinks well, LBM this morning.  We have Mira lax if needed.  Call my mobile number [phone]."  Will keep PCP appointment, fax any lab to [phone].    Addendum to 9:00 am note: "We realized she is not taking pain medicines often enough.  Started yesterday taking pain medication more often."  Obtain oral digital thermometer.  Earwax or small curved ear canals interfer with temperature accuracy.  [employer] carry these If cost is an issue.

## 2017-04-12 ENCOUNTER — Inpatient Hospital Stay (HOSPITAL_BASED_OUTPATIENT_CLINIC_OR_DEPARTMENT_OTHER): Payer: 59 | Admitting: Hematology and Oncology

## 2017-04-12 ENCOUNTER — Encounter: Payer: Self-pay | Admitting: Hematology and Oncology

## 2017-04-12 ENCOUNTER — Telehealth: Payer: Self-pay | Admitting: *Deleted

## 2017-04-12 VITALS — BP 135/81 | HR 69 | Temp 98.6°F | Resp 18 | Ht 67.0 in | Wt 256.7 lb

## 2017-04-12 DIAGNOSIS — R61 Generalized hyperhidrosis: Secondary | ICD-10-CM

## 2017-04-12 DIAGNOSIS — C801 Malignant (primary) neoplasm, unspecified: Secondary | ICD-10-CM

## 2017-04-12 DIAGNOSIS — C787 Secondary malignant neoplasm of liver and intrahepatic bile duct: Secondary | ICD-10-CM | POA: Diagnosis not present

## 2017-04-12 DIAGNOSIS — B370 Candidal stomatitis: Secondary | ICD-10-CM

## 2017-04-12 DIAGNOSIS — G893 Neoplasm related pain (acute) (chronic): Secondary | ICD-10-CM

## 2017-04-12 DIAGNOSIS — Z5111 Encounter for antineoplastic chemotherapy: Secondary | ICD-10-CM | POA: Diagnosis not present

## 2017-04-12 DIAGNOSIS — L270 Generalized skin eruption due to drugs and medicaments taken internally: Secondary | ICD-10-CM

## 2017-04-12 DIAGNOSIS — C539 Malignant neoplasm of cervix uteri, unspecified: Secondary | ICD-10-CM | POA: Diagnosis not present

## 2017-04-12 MED ORDER — FLUCONAZOLE 100 MG PO TABS: 100.0000 mg | ORAL_TABLET | Freq: Every day | ORAL | 0 refills | Status: DC

## 2017-04-12 MED ORDER — MORPHINE SULFATE ER 30 MG PO TBCR: 30.0000 mg | EXTENDED_RELEASE_TABLET | Freq: Three times a day (TID) | ORAL | 0 refills | Status: DC

## 2017-04-12 MED FILL — MORPHINE SULF ER 30 MG TAB: 30 | 20 days supply | Qty: 60 | Fill #0

## 2017-04-12 MED FILL — FLUCONAZOLE 100 MG TABLET: 100 | 7 days supply | Qty: 7 | Fill #0

## 2017-04-12 MED FILL — HYDROmorphone HCL 4 MG TABS: 4 | 15 days supply | Qty: 90 | Fill #0

## 2017-04-12 NOTE — Assessment & Plan Note (Signed)
She has significant skin rash on her chest wall likely due to steroids As above, I recommend dexamethasone taper I also recommend topical acne cream as needed

## 2017-04-12 NOTE — Assessment & Plan Note (Signed)
The intermittent night sweats are likely related to tumor fever Recommend conservative management only Clinically, it does not appear she has signs of bacterial infection

## 2017-04-12 NOTE — Assessment & Plan Note (Signed)
Overall, she is improving with less pain and better functional performance status We will continue aggressive supportive care I will see her back next month prior to cycle 2 of treatment

## 2017-04-12 NOTE — Assessment & Plan Note (Signed)
Her pain is manageable and she has no further confusion episode I recommend her to continue on OxyContin 60 mg twice a day until her prescription is finished Once a prescription is finished, I would recommend switching her to MS Contin 30 mg 3 times a day along with Dilaudid for breakthrough pain medicine I reminded her about risk of nausea and constipation while on pain medicine.

## 2017-04-12 NOTE — Telephone Encounter (Signed)
Received call from patient wanting pathophysiology report faxed to her insurance company. Fax number is [phone]. Claim # is [id number]. Received faxed confirmation.

## 2017-04-12 NOTE — Assessment & Plan Note (Signed)
She has recurrent oral thrush likely secondary to side effects of steroid I recommend dexamethasone taper I will prescribe fluconazole for 7 days I have discussed reducing the dose of Celexa and Lipitor while on fluconazole

## 2017-04-12 NOTE — Progress Notes (Signed)
[facility] OFFICE PROGRESS NOTE  Patient Care Team: Lennie Odor, PA-C as PCP - General (Nurse Practitioner) Arvella Nigh, MD as Obstetrician (Obstetrics and Gynecology) Evans Lance, MD as Consulting Physician (Cardiology) Serena Colonel, RN as [place] Management  ASSESSMENT & PLAN:  Cancer of unknown origin [facility]) Overall, she is improving with less pain and better functional performance status We will continue aggressive supportive care I will see her back next month prior to cycle 2 of treatment  Cancer associated pain Her pain is manageable and she has no further confusion episode I recommend her to continue on OxyContin 60 mg twice a day until her prescription is finished Once a prescription is finished, I would recommend switching her to MS Contin 30 mg 3 times a day along with Dilaudid for breakthrough pain medicine I reminded her about risk of nausea and constipation while on pain medicine.  Oral thrush She has recurrent oral thrush likely secondary to side effects of steroid I recommend dexamethasone taper I will prescribe fluconazole for 7 days I have discussed reducing the dose of Celexa and Lipitor while on fluconazole  Drug-induced skin rash She has significant skin rash on her chest wall likely due to steroids As above, I recommend dexamethasone taper I also recommend topical acne cream as needed   Night sweats The intermittent night sweats are likely related to tumor fever Recommend conservative management only Clinically, it does not appear she has signs of bacterial infection   No orders of the defined types were placed in this encounter.   INTERVAL HISTORY: Please see below for problem oriented charting. She returns with her daughter and her mother for further follow-up She has developed some oral thrush and mucositis pain She also has a rash over her chest wall Pain is well controlled She is taking about  3-4 times Dilaudid for breakthrough pain medicine She reported no recent confusion while on treatment Her walking is better She has a sense of well-being She denies peripheral neuropathy from treatment She has a very mild bleeding after recent bowel movement, resolved spontaneously The patient denies any recent signs or symptoms of bleeding such as spontaneous epistaxis, hematuria or hematochezia. She complained of intermittent pain from the right upper quadrant region. She also have intermittent night sweats that is drenching in nature with occasional fever, spontaneously resolved  SUMMARY OF ONCOLOGIC HISTORY: Oncology History   Biothernostics: 96% probability cervical cancer     Cancer of unknown origin ([place])   03/03/2017 Imaging    1. Multifocal liver metastasis. 2. Left posterior pelvic mass extending into the obturator foramen measures up to 7 cm. Suspicious for malignancy. This should be easily amendable to percutaneous tissue sampling under image guidance. 3. No lytic bone lesions identified of myeloma.      03/09/2017 Procedure    Technically successful ultrasound-guided core liver lesion biopsy      03/09/2017 Pathology Results    A. LIVER LESION, RIGHT LOBE; ULTRASOUND-GUIDED BIOPSY:  - METASTATIC SQUAMOUS CELL CARCINOMA, SEE COMMENT.   Comment: Metastatic carcinoma is immunoreactive for p40, p16, p53 and pancytokeratin while CK7, CK20, and TTF1 are negative. Significance of p16 positivity depends on the site of origin and the pattern of immunoreactivity in this material is non-specific. Possible sites of origin include gynecologic, anal, lung, and head/neck. Correlation with physical exam and additional imaging is required.       03/18/2017 Pathology Results    She had GYN exam and pap smear. Result  is negative for malignancy      03/21/2017 - 04/01/2017 Hospital Admission    She was admitted to the hospital for pain management and had radiation therapy      03/23/2017  Procedure    Ultrasound and fluoroscopically guided right internal jugular single lumen power port catheter insertion. Tip in the SVC/RA junction. Catheter ready for use.       REVIEW OF SYSTEMS:   Constitutional: Denies fevers, chills or abnormal weight loss Eyes: Denies blurriness of vision Respiratory: Denies cough, dyspnea or wheezes Cardiovascular: Denies palpitation, chest discomfort or lower extremity swelling Gastrointestinal:  Denies nausea, heartburn or change in bowel habits Lymphatics: Denies new lymphadenopathy or easy bruising Neurological:Denies numbness, tingling or new weaknesses Behavioral/Psych: Mood is stable, no new changes  All other systems were reviewed with the patient and are negative.  I have reviewed the past medical history, past surgical history, social history and family history with the patient and they are unchanged from previous note.  ALLERGIES:  has No Known Allergies.  MEDICATIONS:  Current Outpatient Medications  Medication Sig Dispense Refill  . ALPRAZolam (XANAX) 0.25 MG tablet Take 1 tablet (0.25 mg total) by mouth 3 (three) times daily as needed for sleep. 60 tablet 0  . atorvastatin (LIPITOR) 40 MG tablet Take 1 tablet (40 mg total) by mouth daily at 6 PM. 30 tablet 0  . Biotin 1000 MCG tablet Take 1,000 mcg by mouth daily.    . cholecalciferol (VITAMIN D) 1000 units tablet Take 2,000 Units by mouth daily.    . citalopram (CELEXA) 20 MG tablet Take 20 mg by mouth daily.    Marland Kitchen dexamethasone (DECADRON) 4 MG tablet Take 1 tablet (4 mg total) by mouth 2 (two) times daily. 14 tablet 0  . dexamethasone (DECADRON) 4 MG tablet Take 5 tabs at night with food and 5 tabs at 6 am the morning of chemotherapy, every 3 weeks 60 tablet 0  . flecainide (TAMBOCOR) 50 MG tablet Take 1 tablet (50 mg total) by mouth daily. 90 tablet 3  . fluconazole (DIFLUCAN) 100 MG tablet Take 1 tablet (100 mg total) by mouth daily. 7 tablet 0  . furosemide (LASIX) 40 MG  tablet Take 40 mg by mouth daily.    Marland Kitchen gabapentin (NEURONTIN) 400 MG capsule Take 1 capsule (400 mg total) by mouth 3 (three) times daily. 90 capsule 0  . Glucosamine-Chondroitin (GLUCOSAMINE CHONDR COMPLEX PO) Take 2 capsules by mouth daily.     Marland Kitchen HYDROmorphone (DILAUDID) 2 MG tablet Take 1-2 tablets (2-4 mg total) by mouth every 3 (three) hours as needed for moderate pain or severe pain. 45 tablet 0  . lidocaine-prilocaine (EMLA) cream Apply 1 application topically as needed. 30 g 0  . metoprolol tartrate (LOPRESSOR) 50 MG tablet Take 1 tablet (50 mg total) by mouth daily. 90 tablet 3  . morphine (MS CONTIN) 30 MG 12 hr tablet Take 1 tablet (30 mg total) by mouth every 8 (eight) hours. 60 tablet 0  . Multiple Vitamin (MULTIVITAMIN) tablet Take 2 tablets by mouth daily.     Marland Kitchen OLANZapine (ZYPREXA) 5 MG tablet Take 1 tablet (5 mg total) by mouth at bedtime. 30 tablet 0  . Omega-3 Fatty Acids (FISH OIL) 1000 MG CAPS Take 2 capsules by mouth daily.      . ondansetron (ZOFRAN) 4 MG tablet Take 1-2 tablets (4-8 mg total) by mouth every 8 (eight) hours as needed for nausea (not responsive to prochlorperazine (COMPAZINE)). 20 tablet 0  .  oxyCODONE (OXYCONTIN) 60 MG 12 hr tablet Take 60 mg by mouth every 8 (eight) hours. 45 tablet 0  . polyethylene glycol (MIRALAX / GLYCOLAX) packet Take 17 g by mouth daily. 14 each 0  . prochlorperazine (COMPAZINE) 10 MG tablet Take 1 tablet (10 mg total) by mouth every 6 (six) hours as needed (Nausea or vomiting). 30 tablet 1  . rivaroxaban (XARELTO) 20 MG TABS tablet Take 1 tablet (20 mg total) by mouth daily. 30 tablet 0  . senna (SENOKOT) 8.6 MG TABS tablet Take 2 tablets (17.2 mg total) by mouth 2 (two) times daily. 120 each 0  . Turmeric (CURCUMIN 95) 500 MG CAPS Take 3 capsules by mouth daily.      No current facility-administered medications for this visit.     PHYSICAL EXAMINATION: ECOG PERFORMANCE STATUS: 1 - Symptomatic but completely ambulatory  Vitals:    04/12/17 1137  BP: 135/81  Pulse: 69  Resp: 18  Temp: 98.6 F (37 C)  SpO2: 98%   Filed Weights   04/12/17 1137  Weight: 256 lb 11.2 oz (116.4 kg)    GENERAL:alert, no distress and comfortable SKIN: skin color, texture, turgor are normal, no rashes or significant lesions EYES: normal, Conjunctiva are pink and non-injected, sclera clear OROPHARYNX: Noted mucositis with oral thrush NECK: supple, thyroid normal size, non-tender, without nodularity LYMPH:  no palpable lymphadenopathy in the cervical, axillary or inguinal LUNGS: clear to auscultation and percussion with normal breathing effort HEART: regular rate & rhythm and no murmurs and no lower extremity edema ABDOMEN:abdomen soft, non-tender and normal bowel sounds Musculoskeletal:no cyanosis of digits and no clubbing  NEURO: alert & oriented x 3 with fluent speech, no focal motor/sensory deficits  LABORATORY DATA:  I have reviewed the data as listed    Component Value Date/Time   NA 136 04/04/2017 0920   K 3.9 04/04/2017 0920   CL 99 04/04/2017 0920   CO2 26 04/04/2017 0920   GLUCOSE 157 (H) 04/04/2017 0920   BUN 16 04/04/2017 0920   CREATININE 0.77 04/04/2017 0920   CALCIUM 8.9 04/04/2017 0920   PROT 6.6 04/04/2017 0920   ALBUMIN 2.7 (L) 04/04/2017 0920   AST 19 04/04/2017 0920   ALT 45 04/04/2017 0920   ALKPHOS 90 04/04/2017 0920   BILITOT 0.6 04/04/2017 0920   GFRNONAA >60 04/04/2017 0920   GFRAA >60 04/04/2017 0920    No results found for: SPEP, UPEP  Lab Results  Component Value Date   WBC 18.1 (H) 04/04/2017   NEUTROABS 15.7 (H) 04/04/2017   HGB 10.7 (L) 04/04/2017   HCT 33.4 (L) 04/04/2017   MCV 82.0 04/04/2017   PLT 269 04/04/2017      Chemistry      Component Value Date/Time   NA 136 04/04/2017 0920   K 3.9 04/04/2017 0920   CL 99 04/04/2017 0920   CO2 26 04/04/2017 0920   BUN 16 04/04/2017 0920   CREATININE 0.77 04/04/2017 0920      Component Value Date/Time   CALCIUM 8.9 04/04/2017  0920   ALKPHOS 90 04/04/2017 0920   AST 19 04/04/2017 0920   ALT 45 04/04/2017 0920   BILITOT 0.6 04/04/2017 0920       RADIOGRAPHIC STUDIES: I have personally reviewed the radiological images as listed and agreed with the findings in the report. Mr Jeri Cos Wo Contrast  Result Date: 03/23/2017 CLINICAL DATA:  52 y/o F; metastatic squamous cell carcinoma for staging. Patient with confusion. EXAM: MRI HEAD WITHOUT  AND WITH CONTRAST TECHNIQUE: Multiplanar, multiecho pulse sequences of the brain and surrounding structures were obtained without and with intravenous contrast. CONTRAST:  38m MULTIHANCE GADOBENATE DIMEGLUMINE 529 MG/ML IV SOLN COMPARISON:  12/05/2015 MRI head FINDINGS: Brain: Motion degradation on multiple sequences. No acute infarction, hemorrhage, hydrocephalus, extra-axial collection or mass lesion. Fewnonspecific foci of T2 FLAIR hyperintense signal abnormality in subcortical and periventricular white matter are compatible withmildchronic microvascular ischemic changes for age. Mildbrain parenchymal volume loss. Small chronic lacunar infarct within genu of left internal capsule. No abnormal enhancement of the brain. Vascular: Normal flow voids. Skull and upper cervical spine: Normal marrow signal. Sinuses/Orbits: Right maxillary sinus mucous retention cyst. Otherwise negative. Other: None. IMPRESSION: 1. No intracranial metastasis identified. 2. Mild chronic microvascular ischemic changes and mild parenchymal volume loss of the brain. 3. Small chronic lacunar infarct within genu of left internal capsule. Electronically Signed   By: LKristine GarbeM.D.   On: 03/23/2017 22:10   Ir [place] Vasc Access Right  Result Date: 03/23/2017 CLINICAL DATA:  METASTATIC SQUAMOUS CELL CARCINOMA EXAM: RIGHT INTERNAL JUGULAR SINGLE LUMEN POWER PORT CATHETER INSERTION Date:  03/23/2017 03/23/2017 3:41 pm Radiologist:  MJerilynn Mages TDaryll Brod MD Guidance:  Ultrasound and fluoroscopic MEDICATIONS: Ancef  3 g; The antibiotic was administered within an appropriate time interval prior to skin puncture. ANESTHESIA/SEDATION: Versed 2.0 mg IV; Fentanyl 100 mcg IV; Moderate Sedation Time:  35 minutes The patient was continuously monitored during the procedure by the interventional radiology nurse under my direct supervision. FLUOROSCOPY TIME:  One minutes, 12 seconds (22 mGy) COMPLICATIONS: None immediate. CONTRAST:  None. PROCEDURE: Informed consent was obtained from the patient following explanation of the procedure, risks, benefits and alternatives. The patient understands, agrees and consents for the procedure. All questions were addressed. A time out was performed. Maximal barrier sterile technique utilized including caps, mask, sterile gowns, sterile gloves, large sterile drape, hand hygiene, and 2% chlorhexidine scrub. Under sterile conditions and local anesthesia, right internal jugular micropuncture venous access was performed. Access was performed with ultrasound. Images were obtained for documentation. A guide wire was inserted followed by a transitional dilator. This allowed insertion of a guide wire and catheter into the IVC. Measurements were obtained from the SVC / RA junction back to the right IJ venotomy site. In the right infraclavicular chest, a subcutaneous pocket was created over the second anterior rib. This was done under sterile conditions and local anesthesia. 1% lidocaine with epinephrine was utilized for this. A 2.5 cm incision was made in the skin. Blunt dissection was performed to create a subcutaneous pocket over the right pectoralis major muscle. The pocket was flushed with saline vigorously. There was adequate hemostasis. The port catheter was assembled and checked for leakage. The port catheter was secured in the pocket with two retention sutures. The tubing was tunneled subcutaneously to the right venotomy site and inserted into the SVC/RA junction through a valved peel-away sheath.  Position was confirmed with fluoroscopy. Images were obtained for documentation. The patient tolerated the procedure well. No immediate complications. Incisions were closed in a two layer fashion with 4 - 0 Vicryl suture. Dermabond was applied to the skin. The port catheter was accessed, blood was aspirated followed by saline and heparin flushes. Needle was removed. A dry sterile dressing was applied. IMPRESSION: Ultrasound and fluoroscopically guided right internal jugular single lumen power port catheter insertion. Tip in the SVC/RA junction. Catheter ready for use. Electronically Signed   By: MJerilynn Mages  Shick M.D.   On: 03/23/2017  15:48   Ir Cyndy Freeze Guide Port Insertion Right  Result Date: 03/23/2017 CLINICAL DATA:  METASTATIC SQUAMOUS CELL CARCINOMA EXAM: RIGHT INTERNAL JUGULAR SINGLE LUMEN POWER PORT CATHETER INSERTION Date:  03/23/2017 03/23/2017 3:41 pm Radiologist:  Jerilynn Mages. Daryll Brod, MD Guidance:  Ultrasound and fluoroscopic MEDICATIONS: Ancef 3 g; The antibiotic was administered within an appropriate time interval prior to skin puncture. ANESTHESIA/SEDATION: Versed 2.0 mg IV; Fentanyl 100 mcg IV; Moderate Sedation Time:  35 minutes The patient was continuously monitored during the procedure by the interventional radiology nurse under my direct supervision. FLUOROSCOPY TIME:  One minutes, 12 seconds (22 mGy) COMPLICATIONS: None immediate. CONTRAST:  None. PROCEDURE: Informed consent was obtained from the patient following explanation of the procedure, risks, benefits and alternatives. The patient understands, agrees and consents for the procedure. All questions were addressed. A time out was performed. Maximal barrier sterile technique utilized including caps, mask, sterile gowns, sterile gloves, large sterile drape, hand hygiene, and 2% chlorhexidine scrub. Under sterile conditions and local anesthesia, right internal jugular micropuncture venous access was performed. Access was performed with ultrasound. Images  were obtained for documentation. A guide wire was inserted followed by a transitional dilator. This allowed insertion of a guide wire and catheter into the IVC. Measurements were obtained from the SVC / RA junction back to the right IJ venotomy site. In the right infraclavicular chest, a subcutaneous pocket was created over the second anterior rib. This was done under sterile conditions and local anesthesia. 1% lidocaine with epinephrine was utilized for this. A 2.5 cm incision was made in the skin. Blunt dissection was performed to create a subcutaneous pocket over the right pectoralis major muscle. The pocket was flushed with saline vigorously. There was adequate hemostasis. The port catheter was assembled and checked for leakage. The port catheter was secured in the pocket with two retention sutures. The tubing was tunneled subcutaneously to the right venotomy site and inserted into the SVC/RA junction through a valved peel-away sheath. Position was confirmed with fluoroscopy. Images were obtained for documentation. The patient tolerated the procedure well. No immediate complications. Incisions were closed in a two layer fashion with 4 - 0 Vicryl suture. Dermabond was applied to the skin. The port catheter was accessed, blood was aspirated followed by saline and heparin flushes. Needle was removed. A dry sterile dressing was applied. IMPRESSION: Ultrasound and fluoroscopically guided right internal jugular single lumen power port catheter insertion. Tip in the SVC/RA junction. Catheter ready for use. Electronically Signed   By: Jerilynn Mages.  Shick M.D.   On: 03/23/2017 15:48    All questions were answered. The patient knows to call the clinic with any problems, questions or concerns. No barriers to learning was detected.  I spent 25 minutes counseling the patient face to face. The total time spent in the appointment was 40 minutes and more than 50% was on counseling and review of test results  [name],  MD 04/12/2017 1:52 PM

## 2017-04-13 ENCOUNTER — Ambulatory Visit: Payer: 59 | Attending: Hematology and Oncology | Admitting: Physical Therapy

## 2017-04-13 ENCOUNTER — Other Ambulatory Visit: Payer: Self-pay

## 2017-04-13 ENCOUNTER — Ambulatory Visit: Payer: 59 | Admitting: Hematology and Oncology

## 2017-04-13 DIAGNOSIS — R2689 Other abnormalities of gait and mobility: Secondary | ICD-10-CM | POA: Insufficient documentation

## 2017-04-13 DIAGNOSIS — M6281 Muscle weakness (generalized): Secondary | ICD-10-CM | POA: Insufficient documentation

## 2017-04-13 NOTE — Therapy (Signed)
[place], [place], [address] Phone: [phone]   Fax:  [phone]  Physical Therapy Evaluation  Patient Details  Name: Cindy Faulkner MRN: [id number] Date of Birth: 06/13/1965 Referring Provider: Dr. [name]   Encounter Date: 04/13/2017  PT End of Session - 04/13/17 2010    Visit Number  1    Number of Visits  3    Date for PT Re-Evaluation  05/22/17    PT Start Time  1350    PT Stop Time  1435    PT Time Calculation (min)  45 min    Activity Tolerance  Patient tolerated treatment well    Behavior During Therapy  [facility] for tasks assessed/performed       Past Medical History:  Diagnosis Date  . Atrial fibrillation ([place])    a. echo 4/07: EF 60%  . Atrial tachycardia ([place])    a. s/p EPS 4/09: no inducible SVT - med Tx continued  . Complication of anesthesia    age 33yo, woke during procedure with GA, paralysed   . Endometrial polyp   . Hyperlipidemia   . Hypertension   . PONV (postoperative nausea and vomiting)   . Rectocele    WITHOUT MENTION OF UTERINE PROLAPSE  . Stroke ([place])   . SVD (spontaneous vaginal delivery)    x 1    Past Surgical History:  Procedure Laterality Date  . COLONOSCOPY    . HYSTEROSCOPY    . IR FLUORO GUIDE PORT INSERTION RIGHT  03/23/2017  . IR US GUIDE VASC ACCESS RIGHT  03/23/2017  . LEEP    . Pylonidal cystect    . TUBAL LIGATION    . WISDOM TOOTH EXTRACTION      There were no vitals filed for this visit.   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 It's the size of a grapefruit and I feel like it's causing footdrop. I asked Dr. Alvy Bimler for some physical therapy before it gets worse.  It can't be operated on and it's growing.    Patient is accompained by:  Family member daughter and mother    Pertinent History  Twinge of back pain one day led to increased pain by later that day; saw chiropractor for one  month; saw orthopedist and got injection, which helped x 1 day.  Had MRI and that found the tumor in December and was sent to neurosurgeon.  Neurosurgeon couldn't do surgery; pt. was sent to oncologist.  Liver lesions were found and one is large, so can't do surgery. Has had one chemo and will have this q 3 weeks x 6. h/o a-fib and a stroke in the past with little or no residual. Is on medication for a-fib with good control.     Patient Stated Goals  do what she can to stay strong, and stay ambulatory    Currently in Pain?  No/denies    Pain Score  0-No pain is on medication for pain         [facility] PT Assessment - 04/13/17 0001      Assessment   Medical Diagnosis  pelvic tumor    Referring Provider  Dr. [name]    Hand Dominance  Right    Prior Therapy  none      Precautions   Precautions  Other (comment)    Precaution Comments  cancer precautions      Restrictions  Weight Bearing Restrictions  No      Balance Screen   Has the patient fallen in the past 6 months  No    Has the patient had a decrease in activity level because of a fear of falling?   No    Is the patient reluctant to leave their home because of a fear of falling?   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[employer]  none currently      Cognition   Overall Cognitive Status  Within Functional Limits for tasks assessed      ROM / Strength   AROM / PROM / Strength  AROM;Strength      AROM   Overall AROM Comments  LEs grossly WFL throughout except left ankle dorsiflexion and inversion limited       Strength   Strength Assessment Site  Hip;Knee;Ankle    Right/Left Hip  Right;Left    Right Hip Flexion  5/5    Right Hip Extension  5/5    Right Hip External Rotation   4/5    Right Hip Internal Rotation  5/5    Right Hip ABduction  5/5    Right Hip ADduction  5/5    Left Hip Flexion  5/5    Left Hip Extension  4/5    Left Hip External Rotation  3+/5    Left Hip Internal Rotation  3+/5    Left Hip ABduction  3+/5    Left Hip ADduction  3+/5    Right/Left Knee  Right;Left    Right Knee Flexion  5/5    Right Knee Extension  5/5    Left Knee Flexion  4/5    Left Knee Extension  5/5    Right/Left Ankle  Right;Left    Right Ankle Dorsiflexion  5/5    Right Ankle Plantar Flexion  5/5 approx.--can toe walk    Right Ankle Inversion  5/5    Right Ankle Eversion  5/5    Left Ankle Dorsiflexion  3-/5    Left Ankle Plantar Flexion  4/5 approx.--can hold through partial range for toe walk    Left Ankle Inversion  3-/5    Left Ankle Eversion  4+/5      Ambulation/Gait   Ambulation/Gait  Yes    Ambulation/Gait Assistance  6: Modified independent (Device/Increase time)    Gait Comments  not smooth, pt. feels like the left legs give in             Objective measurements completed on examination: See above findings.              PT Education - 04/13/17 2009    Education provided  Yes    Education Details  for left ankle: write alphabet with foot; ankle dorsiflexion and inversion actively;    Person(s) Educated  Patient;Child(ren);Parent(s)    Methods  Explanation;Demonstration;Verbal cues;Handout    Comprehension  Verbalized understanding;Returned demonstration  PT Long Term Goals - 04/13/17 2020      PT LONG TERM GOAL #1   Title  Patient will be independent with a home exercise program for left LE strengthening.    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  She reports left leg weakness from this, and says she wants to learn exercise to strengthen so that she remains ambulatory.  She does have weakness of left ankle dorsiflexors and invertors, of hamstrings and hip groups. She has a slightly abnormal gait with the feeling that her left leg is giving in on her.    History and Personal Factors relevant to plan of care:  h/o CVA; currently receiving chemotherapy    Clinical Presentation  Evolving    Clinical Presentation due to:  she reports the mass is growing; currently in chemotherapy    Clinical Decision Making  Moderate    Rehab Potential  Good    Clinical Impairments Affecting Rehab Potential  none    PT Frequency  Other (comment) 1 follow-up visit planned; 2nd follow-up as needed    PT Treatment/Interventions  ADLs/Self Care Home Management;Therapeutic exercise;Patient/family education    PT Next Visit Plan  Teach patient a home exercise program that includes strengthening of left LE hip, knee, and ankle muscle groups--see MMT results for what needs focus. Suggest body weight, functional resistance training such as step-ups and downs, single leg stance; also Theraband resistance exericse.    Consulted and Agree with Plan of Care  Patient       Patient will benefit from skilled therapeutic intervention in order to improve the following deficits and impairments:  Abnormal gait, Decreased strength, Pain  Visit Diagnosis: Muscle weakness (generalized) - Plan: PT plan of care cert/re-cert  Other abnormalities of gait and mobility - Plan: PT plan of care cert/re-cert     Problem List Patient Active Problem List   Diagnosis Date Noted  . Oral thrush 04/12/2017  . Drug-induced skin rash 04/12/2017  . Night sweats 04/12/2017  . Port-A-Cath in place 04/04/2017  . Neuropathy associated  with cancer ([place]) 04/04/2017  . Goals of care, counseling/discussion 04/04/2017  . Metastatic squamous cell carcinoma involving liver & sacrum with unknown primary site  03/30/2017  . Metastasis to liver of unknown origin ([place]) 03/18/2017  . Cancer of unknown origin ([place]) 03/11/2017  . Metastasis to liver ([place]) 03/03/2017  . Sacral mass 03/02/2017  . Cancer associated pain 03/02/2017  . Mild single current episode of major depressive disorder ([place]) 08/23/2016  . Cerebral thrombosis with cerebral infarction 12/05/2015  . Stroke (cerebrum) ([place]) 12/05/2015  . Obstructive sleep apnea 07/24/2015  . Preoperative evaluation to rule out surgical contraindication 10/04/2011  . Chest pain, unspecified 06/24/2010  . Shortness of breath 06/24/2010  . Essential hypertension 10/20/2008  . RECTOCELE WITHOUT MENTION OF UTERINE PROLAPSE 10/20/2008  . ENDOMETRIAL POLYP 10/20/2008  . AF (paroxysmal atrial fibrillation) ([place]) 10/20/2008    Cindy Faulkner 04/13/2017, 8:23 PM  [place], [place], [address] Phone: [phone]   Fax:  [phone]  Name: Cindy Faulkner MRN: [id number] Date  of Birth: 06/09/1965  Serafina Royals, PT 04/13/17 8:24 PM

## 2017-04-13 NOTE — Patient Instructions (Signed)
Ankle Alphabet    Using left ankle and foot only, trace the letters of the alphabet. Perform A to Z. Repeat _1/2 alphabet per set. Do __1__ sets per session. Do __2__ sessions per day.  http://orth.exer.us/17   Copyright  VHI. All rights reserved.   ANKLE: Dorsiflexion, Alternate - Sitting    Sit at edge of sitting surface, feet on floor. Raise front of one foot, then other foot, keeping heels down. Alternate. __10_ reps per set, _1-2__ sets per session, 2 sessions per day   Copyright  VHI. All rights reserved.  ANKLE: Inversion, Bilateral    Sit at edge of surface, feet on floor. Raise toes of feet up and move toward each other. Do not move hips. _10__ reps per set, _1-2__ sets per session, 2 sessions per day  Copyright  VHI. All rights reserved.

## 2017-04-15 ENCOUNTER — Encounter: Payer: Self-pay | Admitting: Hematology and Oncology

## 2017-04-18 ENCOUNTER — Encounter: Payer: Self-pay | Admitting: *Deleted

## 2017-04-18 ENCOUNTER — Ambulatory Visit: Payer: Self-pay | Admitting: *Deleted

## 2017-04-18 ENCOUNTER — Other Ambulatory Visit: Payer: Self-pay | Admitting: *Deleted

## 2017-04-18 NOTE — Patient Outreach (Signed)
[facility] Care Management  04/18/2017  [facility] Oct 29, 1965 [id number]   Subjective: Telephone call to patient's home / mobile number, spoke with patient, and HIPAA verified.  Discussed [facility] Care Management [facility] Transition of care follow up, patient voiced understanding, and is in agreement to follow up.    Patient states she is having an off day today, feeling very tired, resting as needed, feelings change from day to day due to illness, and is aware of signs / symptoms to report to MD.  States she saw primary MD approximately 2 weeks ago for follow up appointment and appointment went well. Patient states she is able to manage self care and has assistance as needed with activities of daily living / home management.  Patient voices understanding of medical diagnosis and treatment plan.   States she is accessing the following [facility] benefits: outpatient pharmacy (only uses local pharmacy if [facility] outpatient pharmacy closed), hospital indemnity, critical illness, and has family medical leave act (FMLA) in place.  Patient states she does not have any education material, transition of care, care coordination, disease management, disease monitoring, transportation, community resource, or pharmacy needs at this time.  States she is very appreciative of the follow up and is in agreement to receive [place] Management information.     Objective:Per KPN (Knowledge Performance Now, point of care tool) and chart review,patient hospitalized 03/21/17 -04/01/17 for pain management,metastatic squamous cell carcinoma from unknown origin with metastases to sacrum,and liver region. Patient also has a history ofObstructive sleep apnea,Sacral mass,Metastasis to liver,Cancer of unknown origin,Metastasis to liver of unknown origin, radiation therapy, palliative care,Atrial fibrillation, and cardiovascular accident (CVA).     Assessment: Received [facility] Transition of care referral on 03/22/17.  Transition of care follow up completed, no care management needs, and will proceed with case closure.      Plan:RNCM will send patient successful outreach letter, [facility] pamphlet, and magnet. RNCM will send case closure due to follow up completed / no care management needs request to [facility] [place] Management.       Cindy Faulkner H. Annia Friendly, BSN, [place] Management [facility] Telephonic CM Phone: [phone] Fax: [phone]

## 2017-04-21 ENCOUNTER — Ambulatory Visit: Payer: 59 | Admitting: Physical Therapy

## 2017-04-26 ENCOUNTER — Telehealth: Payer: Self-pay | Admitting: *Deleted

## 2017-04-26 ENCOUNTER — Inpatient Hospital Stay: Payer: 59

## 2017-04-26 ENCOUNTER — Inpatient Hospital Stay: Payer: 59 | Attending: Hematology and Oncology

## 2017-04-26 ENCOUNTER — Other Ambulatory Visit: Payer: Self-pay | Admitting: Hematology and Oncology

## 2017-04-26 ENCOUNTER — Encounter: Payer: Self-pay | Admitting: *Deleted

## 2017-04-26 ENCOUNTER — Encounter: Payer: Self-pay | Admitting: Hematology and Oncology

## 2017-04-26 ENCOUNTER — Inpatient Hospital Stay (HOSPITAL_BASED_OUTPATIENT_CLINIC_OR_DEPARTMENT_OTHER): Payer: 59 | Admitting: Hematology and Oncology

## 2017-04-26 VITALS — BP 151/88 | HR 87 | Temp 97.9°F | Resp 18 | Ht 67.0 in | Wt 255.1 lb

## 2017-04-26 DIAGNOSIS — R739 Hyperglycemia, unspecified: Secondary | ICD-10-CM | POA: Diagnosis not present

## 2017-04-26 DIAGNOSIS — Z794 Long term (current) use of insulin: Secondary | ICD-10-CM | POA: Diagnosis not present

## 2017-04-26 DIAGNOSIS — C801 Malignant (primary) neoplasm, unspecified: Secondary | ICD-10-CM | POA: Diagnosis not present

## 2017-04-26 DIAGNOSIS — Z7901 Long term (current) use of anticoagulants: Secondary | ICD-10-CM | POA: Insufficient documentation

## 2017-04-26 DIAGNOSIS — G893 Neoplasm related pain (acute) (chronic): Secondary | ICD-10-CM | POA: Diagnosis not present

## 2017-04-26 DIAGNOSIS — R5381 Other malaise: Secondary | ICD-10-CM

## 2017-04-26 DIAGNOSIS — E099 Drug or chemical induced diabetes mellitus without complications: Secondary | ICD-10-CM

## 2017-04-26 DIAGNOSIS — C787 Secondary malignant neoplasm of liver and intrahepatic bile duct: Secondary | ICD-10-CM

## 2017-04-26 DIAGNOSIS — Z79899 Other long term (current) drug therapy: Secondary | ICD-10-CM | POA: Diagnosis not present

## 2017-04-26 DIAGNOSIS — G620 Drug-induced polyneuropathy: Secondary | ICD-10-CM | POA: Insufficient documentation

## 2017-04-26 DIAGNOSIS — R748 Abnormal levels of other serum enzymes: Secondary | ICD-10-CM | POA: Diagnosis not present

## 2017-04-26 DIAGNOSIS — R5383 Other fatigue: Secondary | ICD-10-CM | POA: Diagnosis not present

## 2017-04-26 DIAGNOSIS — T380X5A Adverse effect of glucocorticoids and synthetic analogues, initial encounter: Secondary | ICD-10-CM

## 2017-04-26 DIAGNOSIS — Z5111 Encounter for antineoplastic chemotherapy: Secondary | ICD-10-CM | POA: Diagnosis not present

## 2017-04-26 DIAGNOSIS — M533 Sacrococcygeal disorders, not elsewhere classified: Secondary | ICD-10-CM

## 2017-04-26 DIAGNOSIS — Z95828 Presence of other vascular implants and grafts: Secondary | ICD-10-CM

## 2017-04-26 DIAGNOSIS — R222 Localized swelling, mass and lump, trunk: Secondary | ICD-10-CM

## 2017-04-26 LAB — COMPREHENSIVE METABOLIC PANEL
ALBUMIN: 3.4 g/dL — AB (ref 3.5–5.0)
ALT: 48 U/L (ref 0–55)
AST: 24 U/L (ref 5–34)
Alkaline Phosphatase: 113 U/L (ref 40–150)
Anion gap: 17 — ABNORMAL HIGH (ref 3–11)
BILIRUBIN TOTAL: 0.5 mg/dL (ref 0.2–1.2)
BUN: 15 mg/dL (ref 7–26)
CO2: 16 mmol/L — ABNORMAL LOW (ref 22–29)
CREATININE: 1 mg/dL (ref 0.60–1.10)
Calcium: 10 mg/dL (ref 8.4–10.4)
Chloride: 100 mmol/L (ref 98–109)
GFR calc Af Amer: >60 mL/min (ref >60)
GLUCOSE: 497 mg/dL — AB (ref 70–140)
Potassium: 4.5 mmol/L (ref 3.5–5.1)
Sodium: 133 mmol/L — ABNORMAL LOW (ref 136–145)
Total Protein: 7.8 g/dL (ref 6.4–8.3)

## 2017-04-26 LAB — CBC WITH DIFFERENTIAL/PLATELET
Basophils Absolute: 0 10*3/uL (ref 0.0–0.1)
Basophils Relative: 0 %
EOS ABS: 0 10*3/uL (ref 0.0–0.5)
EOS PCT: 0 %
HCT: 35.1 % (ref 34.8–46.6)
Hemoglobin: 11.3 g/dL — ABNORMAL LOW (ref 11.6–15.9)
LYMPHS ABS: 2 10*3/uL (ref 0.9–3.3)
LYMPHS PCT: 20 %
MCH: 26.6 pg (ref 25.1–34.0)
MCHC: 32.2 g/dL (ref 31.5–36.0)
MCV: 82.6 fL (ref 79.5–101.0)
MONO ABS: 0.1 10*3/uL (ref 0.1–0.9)
Monocytes Relative: 1 %
Neutro Abs: 7.8 10*3/uL — ABNORMAL HIGH (ref 1.5–6.5)
Neutrophils Relative %: 79 %
PLATELETS: 414 10*3/uL — AB (ref 145–400)
RBC: 4.25 MIL/uL (ref 3.70–5.45)
RDW: 17.2 % — AB (ref 11.2–14.5)
WBC: 9.9 10*3/uL (ref 3.9–10.3)

## 2017-04-26 LAB — GLUCOSE, RANDOM
GLUCOSE: 441 mg/dL — AB (ref 70–140)
GLUCOSE: 448 mg/dL — AB (ref 70–140)
Glucose, Bld: 437 mg/dL — ABNORMAL HIGH (ref 70–140)

## 2017-04-26 MED ORDER — SODIUM CHLORIDE 0.9 % IV SOLN
140.0000 mg/m2 | Freq: Once | INTRAVENOUS | Status: AC
  Administered 2017-04-26: 336 mg via INTRAVENOUS
  Filled 2017-04-26: qty 56

## 2017-04-26 MED ORDER — INSULIN REGULAR HUMAN 100 UNIT/ML IJ SOLN
15.0000 [IU] | Freq: Once | INTRAMUSCULAR | Status: DC
  Administered 2017-04-26: 15 [IU] via SUBCUTANEOUS

## 2017-04-26 MED ORDER — FAMOTIDINE IN NACL 20-0.9 MG/50ML-% IV SOLN: 20.0000 mg | Freq: Once | INTRAVENOUS | Status: DC

## 2017-04-26 MED ORDER — SODIUM CHLORIDE 0.9 % IV SOLN
20.0000 mg | Freq: Once | INTRAVENOUS | Status: AC
  Administered 2017-04-26: 20 mg via INTRAVENOUS
  Filled 2017-04-26: qty 2

## 2017-04-26 MED ORDER — HEPARIN SOD (PORK) LOCK FLUSH 100 UNIT/ML IV SOLN
500.0000 [IU] | Freq: Once | INTRAVENOUS | Status: AC | PRN
  Administered 2017-04-26: 500 [IU]
  Filled 2017-04-26: qty 5

## 2017-04-26 MED ORDER — INSULIN REGULAR HUMAN 100 UNIT/ML IJ SOLN
12.0000 [IU] | Freq: Once | INTRAMUSCULAR | Status: AC
  Administered 2017-04-26: 12 [IU] via SUBCUTANEOUS
  Filled 2017-04-26: qty 0.12

## 2017-04-26 MED ORDER — ONDANSETRON HCL 8 MG PO TABS: 8.0000 mg | ORAL_TABLET | Freq: Three times a day (TID) | ORAL | 1 refills | Status: DC | PRN

## 2017-04-26 MED ORDER — DIPHENHYDRAMINE HCL 50 MG/ML IJ SOLN
50.0000 mg | Freq: Once | INTRAMUSCULAR | Status: AC
  Administered 2017-04-26: 50 mg via INTRAVENOUS

## 2017-04-26 MED ORDER — ALTEPLASE 2 MG IJ SOLR
2.0000 mg | Freq: Once | INTRAMUSCULAR | Status: DC | PRN
  Filled 2017-04-26: qty 2

## 2017-04-26 MED ORDER — SODIUM CHLORIDE 0.9 % IV SOLN
750.0000 mg | Freq: Once | INTRAVENOUS | Status: AC
  Administered 2017-04-26: 750 mg via INTRAVENOUS
  Filled 2017-04-26: qty 75

## 2017-04-26 MED ORDER — PALONOSETRON HCL INJECTION 0.25 MG/5ML
0.2500 mg | Freq: Once | INTRAVENOUS | Status: AC
  Administered 2017-04-26: 0.25 mg via INTRAVENOUS

## 2017-04-26 MED ORDER — INSULIN REGULAR HUMAN 100 UNIT/ML IJ SOLN
20.0000 [IU] | Freq: Once | INTRAMUSCULAR | Status: AC
  Administered 2017-04-26: 20 [IU] via SUBCUTANEOUS
  Filled 2017-04-26: qty 0.2

## 2017-04-26 MED ORDER — SODIUM CHLORIDE 0.9 % IV SOLN
Freq: Once | INTRAVENOUS | Status: AC
  Administered 2017-04-26: 10:00:00 via INTRAVENOUS

## 2017-04-26 MED ORDER — PALONOSETRON HCL INJECTION 0.25 MG/5ML
INTRAVENOUS | Status: AC
  Filled 2017-04-26: qty 5

## 2017-04-26 MED ORDER — INSULIN REGULAR HUMAN 100 UNIT/ML IJ SOLN
15.0000 [IU] | Freq: Once | INTRAMUSCULAR | Status: DC
  Filled 2017-04-26: qty 0.15

## 2017-04-26 MED ORDER — SODIUM CHLORIDE 0.9% FLUSH
10.0000 mL | INTRAVENOUS | Status: DC | PRN
  Administered 2017-04-26: 10 mL
  Filled 2017-04-26: qty 10

## 2017-04-26 MED ORDER — LORAZEPAM 1 MG PO TABS
0.5000 mg | ORAL_TABLET | Freq: Once | ORAL | Status: AC
  Administered 2017-04-26: 0.5 mg via ORAL

## 2017-04-26 MED ORDER — FOSAPREPITANT DIMEGLUMINE INJECTION 150 MG
Freq: Once | INTRAVENOUS | Status: AC
  Administered 2017-04-26: 11:00:00 via INTRAVENOUS
  Filled 2017-04-26: qty 5

## 2017-04-26 MED ORDER — SODIUM CHLORIDE 0.9% FLUSH
10.0000 mL | Freq: Once | INTRAVENOUS | Status: AC
  Administered 2017-04-26: 10 mL
  Filled 2017-04-26: qty 10

## 2017-04-26 MED ORDER — LORAZEPAM 1 MG PO TABS
ORAL_TABLET | ORAL | Status: AC
  Filled 2017-04-26: qty 1

## 2017-04-26 MED ORDER — DIPHENHYDRAMINE HCL 50 MG/ML IJ SOLN
INTRAMUSCULAR | Status: AC
  Filled 2017-04-26: qty 1

## 2017-04-26 MED FILL — ONDANSETRON HCL 8 MG TABS: 8 | 30 days supply | Qty: 90 | Fill #0

## 2017-04-26 NOTE — Assessment & Plan Note (Signed)
Currently, she is not symptomatic.  We will continue to monitor carefully

## 2017-04-26 NOTE — Assessment & Plan Note (Signed)
She has recent excessive fatigue Blood sugar today is very high I recommend insulin therapy today We discussed dietary modification I plan to omit oral dexamethasone in the morning of treatment

## 2017-04-26 NOTE — Assessment & Plan Note (Signed)
The patient has self discontinued all pain medicine She has pain medicine to take if needed

## 2017-04-26 NOTE — Patient Instructions (Signed)
[facility] Discharge Instructions for Patients Receiving Chemotherapy  Today you received the following chemotherapy agents:  Taxol, Carboplatin  To help prevent nausea and vomiting after your treatment, we encourage you to take your nausea medication as prescribed.   If you develop nausea and vomiting that is not controlled by your nausea medication, call the clinic.   BELOW ARE SYMPTOMS THAT SHOULD BE REPORTED IMMEDIATELY:  *FEVER GREATER THAN 100.5 F  *CHILLS WITH OR WITHOUT FEVER  NAUSEA AND VOMITING THAT IS NOT CONTROLLED WITH YOUR NAUSEA MEDICATION  *UNUSUAL SHORTNESS OF BREATH  *UNUSUAL BRUISING OR BLEEDING  TENDERNESS IN MOUTH AND THROAT WITH OR WITHOUT PRESENCE OF ULCERS  *URINARY PROBLEMS  *BOWEL PROBLEMS  UNUSUAL RASH Items with * indicate a potential emergency and should be followed up as soon as possible.  Feel free to call the clinic should you have any questions or concerns. The clinic phone number is [phone].  Please show the CHEMO ALERT CARD at check-in to the Emergency Department and triage nurse.   

## 2017-04-26 NOTE — Progress Notes (Signed)
[facility] OFFICE PROGRESS NOTE  Patient Care Team: Lennie Odor, PA-C as PCP - General (Nurse Practitioner) Arvella Nigh, MD as Obstetrician (Obstetrics and Gynecology) Evans Lance, MD as Consulting Physician (Cardiology)  ASSESSMENT & PLAN:  Cancer of unknown origin [facility]) I have reviewed recent additional test results from [facility] She  tested positive for PD 1 overexpression That would represent future immunotherapy as a target for treatment So far, she is doing very well with resolution of all pain She has no side effects from treatment except for hyperglycemia We will proceed with treatment today without dose adjustment  Metastasis to liver [facility]) Currently, she is not symptomatic.  We will continue to monitor carefully  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([place]) She has recent excessive fatigue Blood sugar today is very high I recommend insulin therapy today We discussed dietary modification I plan to omit oral dexamethasone in the morning of treatment  Cancer associated pain The patient has self discontinued all pain medicine She has pain medicine to take if needed   Physical deconditioning She has significant physical deconditioning I recommend physical therapy and rehab through the cancer rehab center   No orders of the defined types were placed in this encounter.   INTERVAL HISTORY: Please see below for problem oriented charting.. She returns with her daughter to be seen prior to cycle 2 of chemotherapy She denies pain She has self discontinue all pain medicine She complained of excessive fatigue and frequent urination She denies recent nausea or vomiting No recent constipation  SUMMARY OF ONCOLOGIC HISTORY: Oncology History   [facility]: 96% probability cervical cancer  Additional evaluation revealed PTEN deletion, negative for MET amplification, PD-L1 expression of 60     Cancer of unknown origin ([place])   03/03/2017 Imaging    1.  Multifocal liver metastasis. 2. Left posterior pelvic mass extending into the obturator foramen measures up to 7 cm. Suspicious for malignancy. This should be easily amendable to percutaneous tissue sampling under image guidance. 3. No lytic bone lesions identified of myeloma.      03/09/2017 Procedure    Technically successful ultrasound-guided core liver lesion biopsy      03/09/2017 Pathology Results    A. LIVER LESION, RIGHT LOBE; ULTRASOUND-GUIDED BIOPSY:  - METASTATIC SQUAMOUS CELL CARCINOMA, SEE COMMENT.   Comment: Metastatic carcinoma is immunoreactive for p40, p16, p53 and pancytokeratin while CK7, CK20, and TTF1 are negative. Significance of p16 positivity depends on the site of origin and the pattern of immunoreactivity in this material is non-specific. Possible sites of origin include gynecologic, anal, lung, and head/neck. Correlation with physical exam and additional imaging is required.       03/18/2017 Pathology Results    She had GYN exam and pap smear. Result is negative for malignancy      03/21/2017 - 04/01/2017 Hospital Admission    She was admitted to the hospital for pain management and had radiation therapy      03/23/2017 Procedure    Ultrasound and fluoroscopically guided right internal jugular single lumen power port catheter insertion. Tip in the SVC/RA junction. Catheter ready for use.       REVIEW OF SYSTEMS:   Constitutional: Denies fevers, chills or abnormal weight loss Eyes: Denies blurriness of vision Ears, nose, mouth, throat, and face: Denies mucositis or sore throat Respiratory: Denies cough, dyspnea or wheezes Cardiovascular: Denies palpitation, chest discomfort or lower extremity swelling Gastrointestinal:  Denies nausea, heartburn or change in bowel habits Skin: Denies abnormal skin rashes Lymphatics: Denies new  lymphadenopathy or easy bruising Neurological:Denies numbness, tingling or new weaknesses Behavioral/Psych: Mood is stable, no  new changes  All other systems were reviewed with the patient and are negative.  I have reviewed the past medical history, past surgical history, social history and family history with the patient and they are unchanged from previous note.  ALLERGIES:  has No Known Allergies.  MEDICATIONS:  Current Outpatient Medications  Medication Sig Dispense Refill  . ALPRAZolam (XANAX) 0.25 MG tablet Take 1 tablet (0.25 mg total) by mouth 3 (three) times daily as needed for sleep. 60 tablet 0  . atorvastatin (LIPITOR) 40 MG tablet Take 1 tablet (40 mg total) by mouth daily at 6 PM. 30 tablet 0  . Biotin 1000 MCG tablet Take 1,000 mcg by mouth daily.    . cholecalciferol (VITAMIN D) 1000 units tablet Take 2,000 Units by mouth daily.    . citalopram (CELEXA) 20 MG tablet Take 20 mg by mouth daily.    Marland Kitchen dexamethasone (DECADRON) 4 MG tablet Take 5 tabs at night with food and 5 tabs at 6 am the morning of chemotherapy, every 3 weeks 60 tablet 0  . flecainide (TAMBOCOR) 50 MG tablet Take 1 tablet (50 mg total) by mouth daily. 90 tablet 3  . furosemide (LASIX) 40 MG tablet Take 40 mg by mouth daily.    Marland Kitchen gabapentin (NEURONTIN) 400 MG capsule Take 1 capsule (400 mg total) by mouth 3 (three) times daily. 90 capsule 0  . Glucosamine-Chondroitin (GLUCOSAMINE CHONDR COMPLEX PO) Take 2 capsules by mouth daily.     Marland Kitchen HYDROmorphone (DILAUDID) 2 MG tablet Take 1-2 tablets (2-4 mg total) by mouth every 3 (three) hours as needed for moderate pain or severe pain. 45 tablet 0  . lidocaine-prilocaine (EMLA) cream Apply 1 application topically as needed. 30 g 0  . metoprolol tartrate (LOPRESSOR) 50 MG tablet Take 1 tablet (50 mg total) by mouth daily. 90 tablet 3  . Multiple Vitamin (MULTIVITAMIN) tablet Take 2 tablets by mouth daily.     . Omega-3 Fatty Acids (FISH OIL) 1000 MG CAPS Take 2 capsules by mouth daily.      . ondansetron (ZOFRAN) 8 MG tablet Take 1 tablet (8 mg total) by mouth every 8 (eight) hours as needed for  nausea. 90 tablet 1  . polyethylene glycol (MIRALAX / GLYCOLAX) packet Take 17 g by mouth daily. 14 each 0  . prochlorperazine (COMPAZINE) 10 MG tablet Take 1 tablet (10 mg total) by mouth every 6 (six) hours as needed (Nausea or vomiting). 30 tablet 1  . rivaroxaban (XARELTO) 20 MG TABS tablet Take 1 tablet (20 mg total) by mouth daily. 30 tablet 0  . senna (SENOKOT) 8.6 MG TABS tablet Take 2 tablets (17.2 mg total) by mouth 2 (two) times daily. (Patient not taking: Reported on 04/13/2017) 120 each 0  . Turmeric (CURCUMIN 95) 500 MG CAPS Take 3 capsules by mouth daily.      No current facility-administered medications for this visit.    Facility-Administered Medications Ordered in Other Visits  Medication Dose Route Frequency Provider Last Rate Last Dose  . CARBOplatin (PARAPLATIN) 750 mg in sodium chloride 0.9 % 250 mL chemo infusion  750 mg Intravenous Once Alvy Bimler, Zedrick Springsteen, MD      . heparin lock flush 100 unit/mL  500 Units Intracatheter Once PRN Alvy Bimler, Uriyah Massimo, MD      . PACLitaxel (TAXOL) 336 mg in sodium chloride 0.9 % 500 mL chemo infusion (> 64m/m2)  140 mg/m2 (  Treatment Plan Recorded) Intravenous Once Adler Alton, MD      . sodium chloride flush (NS) 0.9 % injection 10 mL  10 mL Intracatheter PRN Alvy Bimler, Cheyanna Strick, MD        PHYSICAL EXAMINATION: ECOG PERFORMANCE STATUS: 2 - Symptomatic, <50% confined to bed  Vitals:   04/26/17 0935  BP: (!) 151/88  Pulse: 87  Resp: 18  Temp: 97.9 F (36.6 C)  SpO2: 99%   Filed Weights   04/26/17 0935  Weight: 255 lb 1.6 oz (115.7 kg)    GENERAL:alert, no distress and comfortable. SHe is obese SKIN: skin color, texture, turgor are normal, no rashes or significant lesions EYES: normal, Conjunctiva are pink and non-injected, sclera clear OROPHARYNX:no exudate, no erythema and lips, buccal mucosa, and tongue normal  NECK: supple, thyroid normal size, non-tender, without nodularity LYMPH:  no palpable lymphadenopathy in the cervical, axillary or  inguinal LUNGS: clear to auscultation and percussion with normal breathing effort HEART: regular rate & rhythm and no murmurs and no lower extremity edema ABDOMEN:abdomen soft, non-tender and normal bowel sounds Musculoskeletal:no cyanosis of digits and no clubbing  NEURO: alert & oriented x 3 with fluent speech, no focal motor/sensory deficits  LABORATORY DATA:  I have reviewed the data as listed    Component Value Date/Time   NA 133 (L) 04/26/2017 0850   K 4.5 04/26/2017 0850   CL 100 04/26/2017 0850   CO2 16 (L) 04/26/2017 0850   GLUCOSE 497 (H) 04/26/2017 0850   BUN 15 04/26/2017 0850   CREATININE 1.00 04/26/2017 0850   CALCIUM 10.0 04/26/2017 0850   PROT 7.8 04/26/2017 0850   ALBUMIN 3.4 (L) 04/26/2017 0850   AST 24 04/26/2017 0850   ALT 48 04/26/2017 0850   ALKPHOS 113 04/26/2017 0850   BILITOT 0.5 04/26/2017 0850   GFRNONAA >60 04/26/2017 0850   GFRAA >60 04/26/2017 0850    No results found for: SPEP, UPEP  Lab Results  Component Value Date   WBC 9.9 04/26/2017   NEUTROABS 7.8 (H) 04/26/2017   HGB 11.3 (L) 04/26/2017   HCT 35.1 04/26/2017   MCV 82.6 04/26/2017   PLT 414 (H) 04/26/2017      Chemistry      Component Value Date/Time   NA 133 (L) 04/26/2017 0850   K 4.5 04/26/2017 0850   CL 100 04/26/2017 0850   CO2 16 (L) 04/26/2017 0850   BUN 15 04/26/2017 0850   CREATININE 1.00 04/26/2017 0850      Component Value Date/Time   CALCIUM 10.0 04/26/2017 0850   ALKPHOS 113 04/26/2017 0850   AST 24 04/26/2017 0850   ALT 48 04/26/2017 0850   BILITOT 0.5 04/26/2017 0850      All questions were answered. The patient knows to call the clinic with any problems, questions or concerns. No barriers to learning was detected.  I spent 25 minutes counseling the patient face to face. The total time spent in the appointment was 30 minutes and more than 50% was on counseling and review of test results  [name], MD 04/26/2017 11:52 AM

## 2017-04-26 NOTE — Assessment & Plan Note (Signed)
I have reviewed recent additional test results from [facility] She  tested positive for PD 1 overexpression That would represent future immunotherapy as a target for treatment So far, she is doing very well with resolution of all pain She has no side effects from treatment except for hyperglycemia We will proceed with treatment today without dose adjustment

## 2017-04-26 NOTE — Assessment & Plan Note (Signed)
She has significant physical deconditioning I recommend physical therapy and rehab through the cancer rehab center

## 2017-04-26 NOTE — Telephone Encounter (Signed)
Completed Credit Disability form faxed to [place]

## 2017-04-26 NOTE — Progress Notes (Signed)
Port-A-Cath accessed, no blood return noted. Site flushes well with no swelling or pain noted upon flushing.  Cath-Flo instilled. Site marked, will monitor for blood return.

## 2017-04-27 ENCOUNTER — Telehealth: Payer: Self-pay

## 2017-04-27 NOTE — Telephone Encounter (Signed)
Called with below message. Verbalized understanding. 

## 2017-04-27 NOTE — Telephone Encounter (Signed)
Patient called and left message. Glucose is trending down. She just checked it and it was 238. She is going to call PCP and get them to handle her glucose levels if that is okay with Dr. Alvy Bimler.

## 2017-04-27 NOTE — Telephone Encounter (Signed)
Thanks for the update Yes, to PCP follow-up

## 2017-05-06 ENCOUNTER — Encounter: Payer: Self-pay | Admitting: Hematology and Oncology

## 2017-05-06 ENCOUNTER — Inpatient Hospital Stay (HOSPITAL_BASED_OUTPATIENT_CLINIC_OR_DEPARTMENT_OTHER): Payer: 59 | Admitting: Hematology and Oncology

## 2017-05-06 VITALS — BP 135/90 | HR 94 | Temp 98.4°F | Resp 18 | Ht 67.0 in | Wt 248.3 lb

## 2017-05-06 DIAGNOSIS — Z794 Long term (current) use of insulin: Secondary | ICD-10-CM | POA: Diagnosis not present

## 2017-05-06 DIAGNOSIS — G629 Polyneuropathy, unspecified: Secondary | ICD-10-CM

## 2017-05-06 DIAGNOSIS — Z7901 Long term (current) use of anticoagulants: Secondary | ICD-10-CM

## 2017-05-06 DIAGNOSIS — R5383 Other fatigue: Secondary | ICD-10-CM

## 2017-05-06 DIAGNOSIS — G893 Neoplasm related pain (acute) (chronic): Secondary | ICD-10-CM

## 2017-05-06 DIAGNOSIS — G63 Polyneuropathy in diseases classified elsewhere: Secondary | ICD-10-CM

## 2017-05-06 DIAGNOSIS — C787 Secondary malignant neoplasm of liver and intrahepatic bile duct: Secondary | ICD-10-CM

## 2017-05-06 DIAGNOSIS — Z79899 Other long term (current) drug therapy: Secondary | ICD-10-CM | POA: Diagnosis not present

## 2017-05-06 DIAGNOSIS — E099 Drug or chemical induced diabetes mellitus without complications: Secondary | ICD-10-CM | POA: Diagnosis not present

## 2017-05-06 DIAGNOSIS — R5381 Other malaise: Secondary | ICD-10-CM

## 2017-05-06 DIAGNOSIS — E119 Type 2 diabetes mellitus without complications: Secondary | ICD-10-CM

## 2017-05-06 DIAGNOSIS — T380X5A Adverse effect of glucocorticoids and synthetic analogues, initial encounter: Secondary | ICD-10-CM

## 2017-05-06 DIAGNOSIS — C801 Malignant (primary) neoplasm, unspecified: Secondary | ICD-10-CM | POA: Diagnosis not present

## 2017-05-06 DIAGNOSIS — R748 Abnormal levels of other serum enzymes: Secondary | ICD-10-CM | POA: Diagnosis not present

## 2017-05-06 DIAGNOSIS — G620 Drug-induced polyneuropathy: Secondary | ICD-10-CM | POA: Diagnosis not present

## 2017-05-06 DIAGNOSIS — Z5111 Encounter for antineoplastic chemotherapy: Secondary | ICD-10-CM | POA: Diagnosis not present

## 2017-05-06 NOTE — Assessment & Plan Note (Signed)
She has profound clinical deconditioning She was referred to cancer rehab facility but has cancelled her appointment We will call them for referral again

## 2017-05-06 NOTE — Assessment & Plan Note (Signed)
She tolerated recent chemotherapy poorly due to severe bone pain likely secondary to Taxol Plan to reduce future dose of Taxol I will see her back prior to cycle 3 of treatment I plan to repeat imaging study before the end of the month for objective response to treatment

## 2017-05-06 NOTE — Assessment & Plan Note (Signed)
She had recent steroid-induced diabetes We discussed reduce oral premedication of dexamethasone We also discussed dietary modification

## 2017-05-06 NOTE — Progress Notes (Signed)
[facility] OFFICE PROGRESS NOTE  Patient Care Team: Cindy Odor, PA-C as PCP - General (Nurse Practitioner) Arvella Nigh, MD as Obstetrician (Obstetrics and Gynecology) Evans Lance, MD as Consulting Physician (Cardiology)  ASSESSMENT & PLAN:  Cancer of unknown origin [facility]) She tolerated recent chemotherapy poorly due to severe bone pain likely secondary to Taxol Plan to reduce future dose of Taxol I will see her back prior to cycle 3 of treatment I plan to repeat imaging study before the end of the month for objective response to treatment  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([place]) She had recent steroid-induced diabetes We discussed reduce oral premedication of dexamethasone We also discussed dietary modification  Neuropathy associated with cancer ([place]) This is stable on prescription medications I plan to reduce the dose of chemotherapy as above  Physical deconditioning She has profound clinical deconditioning She was referred to cancer rehab facility but has cancelled her appointment We will call them for referral again  Cancer associated pain She had recent severe bone pain secondary to chemo She has pain medicine to take as needed She denies pelvic pain recently   Orders Placed This Encounter  Procedures  . CT ABDOMEN PELVIS W CONTRAST    Standing Status:   Future    Standing Expiration Date:   05/07/2018    Order Specific Question:   If indicated for the ordered procedure, I authorize the administration of contrast media per Radiology protocol    Answer:   Yes    Order Specific Question:   Preferred imaging location?    Answer:   [facility]    Order Specific Question:   Radiology Contrast Protocol - do NOT remove file path    Answer:   [email or web address]    Order Specific Question:   Is patient pregnant?    Answer:   No    INTERVAL HISTORY: Please see below for problem oriented charting. She returns with her  daughter for further follow-up She tolerated cycle 2 of chemotherapy very poorly Her blood sugars control has improved.  She is avoiding excessive carbohydrate intake She had recent severe pain after chemotherapy requiring pain medicine That has resolved She denies worsening peripheral neuropathy She has excessive fatigue and can barely walk short distances before feeling fatigued She has not been seen by cancer rehab facility She denies recent falls No recent severe nausea, vomiting or changes in bowel habits  SUMMARY OF ONCOLOGIC HISTORY: Oncology History   Biothernostics: 96% probability cervical cancer  Additional evaluation revealed PTEN deletion, negative for MET amplification, PD-L1 expression of 60     Cancer of unknown origin ([place])   03/03/2017 Imaging    1. Multifocal liver metastasis. 2. Left posterior pelvic mass extending into the obturator foramen measures up to 7 cm. Suspicious for malignancy. This should be easily amendable to percutaneous tissue sampling under image guidance. 3. No lytic bone lesions identified of myeloma.      03/09/2017 Procedure    Technically successful ultrasound-guided core liver lesion biopsy      03/09/2017 Pathology Results    A. LIVER LESION, RIGHT LOBE; ULTRASOUND-GUIDED BIOPSY:  - METASTATIC SQUAMOUS CELL CARCINOMA, SEE COMMENT.   Comment: Metastatic carcinoma is immunoreactive for p40, p16, p53 and pancytokeratin while CK7, CK20, and TTF1 are negative. Significance of p16 positivity depends on the site of origin and the pattern of immunoreactivity in this material is non-specific. Possible sites of origin include gynecologic, anal, lung, and head/neck. Correlation with physical exam and additional imaging is required.  03/18/2017 Pathology Results    She had GYN exam and pap smear. Result is negative for malignancy      03/21/2017 - 04/01/2017 Hospital Admission    She was admitted to the hospital for pain management and had  radiation therapy      03/23/2017 Procedure    Ultrasound and fluoroscopically guided right internal jugular single lumen power port catheter insertion. Tip in the SVC/RA junction. Catheter ready for use.       REVIEW OF SYSTEMS:   Constitutional: Denies fevers, chills or abnormal weight loss Eyes: Denies blurriness of vision Ears, nose, mouth, throat, and face: Denies mucositis or sore throat Respiratory: Denies cough, dyspnea or wheezes Cardiovascular: Denies palpitation, chest discomfort or lower extremity swelling Gastrointestinal:  Denies nausea, heartburn or change in bowel habits Skin: Denies abnormal skin rashes Lymphatics: Denies new lymphadenopathy or easy bruising Behavioral/Psych: Mood is stable, no new changes  All other systems were reviewed with the patient and are negative.  I have reviewed the past medical history, past surgical history, social history and family history with the patient and they are unchanged from previous note.  ALLERGIES:  has No Known Allergies.  MEDICATIONS:  Current Outpatient Medications  Medication Sig Dispense Refill  . ALPRAZolam (XANAX) 0.25 MG tablet Take 1 tablet (0.25 mg total) by mouth 3 (three) times daily as needed for sleep. 60 tablet 0  . atorvastatin (LIPITOR) 40 MG tablet Take 1 tablet (40 mg total) by mouth daily at 6 PM. 30 tablet 0  . Biotin 1000 MCG tablet Take 1,000 mcg by mouth daily.    . cholecalciferol (VITAMIN D) 1000 units tablet Take 2,000 Units by mouth daily.    . citalopram (CELEXA) 20 MG tablet Take 20 mg by mouth daily.    Marland Kitchen dexamethasone (DECADRON) 4 MG tablet Take 5 tabs at night with food and 5 tabs at 6 am the morning of chemotherapy, every 3 weeks 60 tablet 0  . flecainide (TAMBOCOR) 50 MG tablet Take 1 tablet (50 mg total) by mouth daily. 90 tablet 3  . furosemide (LASIX) 40 MG tablet Take 40 mg by mouth daily.    Marland Kitchen gabapentin (NEURONTIN) 400 MG capsule Take 1 capsule (400 mg total) by mouth 3 (three)  times daily. 90 capsule 0  . Glucosamine-Chondroitin (GLUCOSAMINE CHONDR COMPLEX PO) Take 2 capsules by mouth daily.     Marland Kitchen HYDROmorphone (DILAUDID) 2 MG tablet Take 1-2 tablets (2-4 mg total) by mouth every 3 (three) hours as needed for moderate pain or severe pain. 45 tablet 0  . lidocaine-prilocaine (EMLA) cream Apply 1 application topically as needed. 30 g 0  . metoprolol tartrate (LOPRESSOR) 50 MG tablet Take 1 tablet (50 mg total) by mouth daily. 90 tablet 3  . Multiple Vitamin (MULTIVITAMIN) tablet Take 2 tablets by mouth daily.     . Omega-3 Fatty Acids (FISH OIL) 1000 MG CAPS Take 2 capsules by mouth daily.      . ondansetron (ZOFRAN) 8 MG tablet Take 1 tablet (8 mg total) by mouth every 8 (eight) hours as needed for nausea. 90 tablet 1  . polyethylene glycol (MIRALAX / GLYCOLAX) packet Take 17 g by mouth daily. 14 each 0  . prochlorperazine (COMPAZINE) 10 MG tablet Take 1 tablet (10 mg total) by mouth every 6 (six) hours as needed (Nausea or vomiting). 30 tablet 1  . rivaroxaban (XARELTO) 20 MG TABS tablet Take 1 tablet (20 mg total) by mouth daily. 30 tablet 0  . senna (  SENOKOT) 8.6 MG TABS tablet Take 2 tablets (17.2 mg total) by mouth 2 (two) times daily. (Patient not taking: Reported on 04/13/2017) 120 each 0   No current facility-administered medications for this visit.    Facility-Administered Medications Ordered in Other Visits  Medication Dose Route Frequency Provider Last Rate Last Dose  . insulin regular (NOVOLIN R,HUMULIN R) 100 units/mL injection 15 Units  15 Units Subcutaneous Once Alvy Bimler, Shloima Clinch, MD        PHYSICAL EXAMINATION: ECOG PERFORMANCE STATUS: 2 - Symptomatic, <50% confined to bed  Vitals:   05/06/17 0917  BP: 135/90  Pulse: 94  Resp: 18  Temp: 98.4 F (36.9 C)  SpO2: 100%   Filed Weights   05/06/17 0917  Weight: 248 lb 4.8 oz (112.6 kg)    GENERAL:alert, no distress and comfortable SKIN: skin color, texture, turgor are normal, no rashes or  significant lesions EYES: normal, Conjunctiva are pink and non-injected, sclera clear OROPHARYNX:no exudate, no erythema and lips, buccal mucosa, and tongue normal  NECK: supple, thyroid normal size, non-tender, without nodularity LYMPH:  no palpable lymphadenopathy in the cervical, axillary or inguinal LUNGS: clear to auscultation and percussion with normal breathing effort HEART: regular rate & rhythm and no murmurs and no lower extremity edema ABDOMEN:abdomen soft, non-tender and normal bowel sounds Musculoskeletal:no cyanosis of digits and no clubbing  NEURO: alert & oriented x 3 with fluent speech, no focal motor/sensory deficits  LABORATORY DATA:  I have reviewed the data as listed    Component Value Date/Time   NA 133 (L) 04/26/2017 0850   K 4.5 04/26/2017 0850   CL 100 04/26/2017 0850   CO2 16 (L) 04/26/2017 0850   GLUCOSE 437 (H) 04/26/2017 1523   BUN 15 04/26/2017 0850   CREATININE 1.00 04/26/2017 0850   CALCIUM 10.0 04/26/2017 0850   PROT 7.8 04/26/2017 0850   ALBUMIN 3.4 (L) 04/26/2017 0850   AST 24 04/26/2017 0850   ALT 48 04/26/2017 0850   ALKPHOS 113 04/26/2017 0850   BILITOT 0.5 04/26/2017 0850   GFRNONAA >60 04/26/2017 0850   GFRAA >60 04/26/2017 0850    No results found for: SPEP, UPEP  Lab Results  Component Value Date   WBC 9.9 04/26/2017   NEUTROABS 7.8 (H) 04/26/2017   HGB 11.3 (L) 04/26/2017   HCT 35.1 04/26/2017   MCV 82.6 04/26/2017   PLT 414 (H) 04/26/2017      Chemistry      Component Value Date/Time   NA 133 (L) 04/26/2017 0850   K 4.5 04/26/2017 0850   CL 100 04/26/2017 0850   CO2 16 (L) 04/26/2017 0850   BUN 15 04/26/2017 0850   CREATININE 1.00 04/26/2017 0850      Component Value Date/Time   CALCIUM 10.0 04/26/2017 0850   ALKPHOS 113 04/26/2017 0850   AST 24 04/26/2017 0850   ALT 48 04/26/2017 0850   BILITOT 0.5 04/26/2017 0850       All questions were answered. The patient knows to call the clinic with any problems,  questions or concerns. No barriers to learning was detected.  I spent 25 minutes counseling the patient face to face. The total time spent in the appointment was 30 minutes and more than 50% was on counseling and review of test results  [name], MD 05/06/2017 12:15 PM

## 2017-05-06 NOTE — Assessment & Plan Note (Signed)
This is stable on prescription medications I plan to reduce the dose of chemotherapy as above

## 2017-05-06 NOTE — Assessment & Plan Note (Signed)
She had recent severe bone pain secondary to chemo She has pain medicine to take as needed She denies pelvic pain recently

## 2017-05-09 ENCOUNTER — Telehealth: Payer: Self-pay | Admitting: Hematology and Oncology

## 2017-05-09 ENCOUNTER — Other Ambulatory Visit: Payer: Self-pay | Admitting: Hematology and Oncology

## 2017-05-09 DIAGNOSIS — R5381 Other malaise: Secondary | ICD-10-CM

## 2017-05-09 DIAGNOSIS — C801 Malignant (primary) neoplasm, unspecified: Secondary | ICD-10-CM

## 2017-05-09 NOTE — Telephone Encounter (Signed)
1:57 pm spoke with patient to confirm FMLA and Disability paperwork has been successfully faxed to [facility] @ [phone] and [employer] @ [phone].  Patient requested a copy of both to sent via mail for personal records.

## 2017-05-09 NOTE — Telephone Encounter (Signed)
Per 3/15 no new orders

## 2017-05-11 ENCOUNTER — Encounter: Payer: Self-pay | Admitting: Physical Therapy

## 2017-05-11 ENCOUNTER — Ambulatory Visit: Payer: 59 | Attending: Hematology and Oncology | Admitting: Physical Therapy

## 2017-05-11 ENCOUNTER — Ambulatory Visit: Payer: 59 | Admitting: Physical Therapy

## 2017-05-11 DIAGNOSIS — M6281 Muscle weakness (generalized): Secondary | ICD-10-CM | POA: Insufficient documentation

## 2017-05-11 DIAGNOSIS — R2689 Other abnormalities of gait and mobility: Secondary | ICD-10-CM | POA: Insufficient documentation

## 2017-05-11 NOTE — Patient Instructions (Signed)
1. Decompression Exercise     Cancer Rehab [phone]    Lie on back on firm surface, knees bent, feet flat, arms turned up, out to sides, backs of hands down. Time _5-15__ minutes. Surface: floor   2. Shoulder Press    Start in Decompression Exercise position. Press shoulders downward towards supporting surface. Hold __2-3__ seconds while counting out loud. Repeat _3-5___ times. Do _1-2___ times per day.   3. Head Press    Bring cervical spine (neck) into neutral position (by either tucking the chin towards the chest or tilting the chin upward). Feel weight on back of head. Press head downward into supporting surface.    Hold _2-3__ seconds. Repeat _3-5__ times. Do _1-2__ times per day.   4. Leg Lengthener    Straighten one leg. Pull toes AND forefoot toward knee, extend heel. Lengthen leg by pulling pelvis away from ribs. Hold _2-3__ seconds. Relax. Repeat __4-6__ times. Do other leg.  Surface: floor   5. Leg Press    Straighten one leg down to floor keeping leg aligned with hip. Pull toes AND forefoot toward knee; extend heel.  Press entire leg downward (as if pressing leg into sandy beach). DO NOT BEND KNEE. Hold _2-3__ seconds. Do __4-6__ times. Repeat with other leg.    PELVIC TILT  Lie on back, legs bent. Exhale, tilting top of pelvis back, pubic bone up, to flatten lower back. Inhale, rolling pelvis opposite way, top forward, pubic bone down, arch in back. Repeat __10__ times. Do __2__ sessions per day. Copyright  VHI. All rights reserved.     Lie with hips and knees bent. Slowly inhale, and then exhale. Pull navel toward spine and tighten pelvic floor. Hold for __10_ seconds. Continue to breathe in and out during hold. Rest for _10__ seconds. Repeat __10_ times. Do __2-3_ times a day.   Copyright  VHI. All rights reserved.  Knee Fold   Lie on back, legs bent, arms by sides. Exhale, lifting knee to chest. Inhale, returning. Keep abdominals flat, navel to  spine. Repeat __10__ times, alternating legs. Do __2__ sessions per day.  Copyright  VHI. All rights reserved.  Knee Drop   Keep pelvis stable. Without rotating hips, slowly drop knee to side, pause, return to center, bring knee across midline toward opposite hip. Feel obliques engaging. Repeat for ___10_ times each leg.  Copyright  VHI. All rights reserved.  Isometric Hold With Pelvic Floor (Hook-Lying)    Copyright  VHI. All rights reserved.  Heel Slide to Straight   Slide one leg down to straight. Return. Be sure pelvis does not rock forward, tilt, rotate, or tip to side. Do _10__ times. Restabilize pelvis. Repeat with other leg. Do __1-2_ sets, __2_ times per day.  http://ss.exer.us/16   Copyright  VHI. All rights reserved.  

## 2017-05-11 NOTE — Therapy (Signed)
[place], [place], [address] Phone: [phone]   Fax:  [phone]  Physical Therapy Treatment  Patient Details  Name: Cindy Faulkner MRN: [id number] Date of Birth: [birth year] Referring Provider: Dr. Alvy Bimler    Encounter Date: 05/11/2017  PT End of Session - 05/11/17 1051    Number of Visits  18    Date for PT Re-Evaluation  07/11/17    PT Start Time  0846    PT Stop Time  0926    PT Time Calculation (min)  40 min    Activity Tolerance  Treatment limited secondary to medical complications (Comment)    Behavior During Therapy  [facility] for tasks assessed/performed       Past Medical History:  Diagnosis Date  . Atrial fibrillation ([place])    a. echo 4/07: EF 60%  . Atrial tachycardia ([place])    a. s/p EPS 4/09: no inducible SVT - med Tx continued  . Complication of anesthesia    age 62yo, woke during procedure with GA, paralysed   . Endometrial polyp   . Hyperlipidemia   . Hypertension   . PONV (postoperative nausea and vomiting)   . Rectocele    WITHOUT MENTION OF UTERINE PROLAPSE  . Stroke ([place])   . SVD (spontaneous vaginal delivery)    x 1    Past Surgical History:  Procedure Laterality Date  . COLONOSCOPY    . HYSTEROSCOPY    . IR FLUORO GUIDE PORT INSERTION RIGHT  03/23/2017  . IR US GUIDE VASC ACCESS RIGHT  03/23/2017  . LEEP    . Pylonidal cystect    . TUBAL LIGATION    . WISDOM TOOTH EXTRACTION      There were no vitals filed for this visit.  Subjective Assessment - 05/11/17 0851    Subjective  Pt has not been able to come back as she gets very fatigued for 2 weeks after chemo, but she is pretty good on the third week.  Her next chemo is Tuesday,  She wants to do more generalized exercise to get stronger as she says she had to use a walker a few times after chemo.     Pertinent History  Twinge of back pain one day led to increased pain by later that day; saw chiropractor for one  month; saw orthopedist and got injection, which helped x 1 day.  Had MRI and that found the tumor in December and was sent to neurosurgeon.  Neurosurgeon couldn't do surgery; pt. was sent to oncologist.  Liver lesions were found and one is large, so can't do surgery. Has had one chemo and will have this q 3 weeks x 6. h/o a-fib and a stroke in the past with little or no residual. Is on medication for a-fib with good control.     Patient Stated Goals  do what she can to stay strong, and stay ambulatory    Currently in Pain?  No/denies         [facility] PT Assessment - 05/11/17 1053      Assessment   Medical Diagnosis  pelvic tumor    Referring Provider  Dr. Alvy Bimler     Onset Date/Surgical Date  03/03/17      Prior Function   Level of Independence  Independent                  [facility] Adult PT Treatment/Exercise - 05/11/17 0001      Lumbar Exercises:  Supine   Pelvic Tilt  10 reps    Pelvic Tilt Limitations  5 reps tilt, 5 with lower core activation     Clam  10 reps 5 on each side     Heel Slides  10 reps 5 on each side     Bent Knee Raise  10 reps 5 on each side     Bridge with Ball Squeeze  5 reps    Other Supine Lumbar Exercises  ball squeeze for hip adduction     Other Supine Lumbar Exercises  Meeks decompression series       Lumbar Exercises: Sidelying   Clam  Both;5 reps    Hip Abduction  Both;5 reps    Hip Abduction Limitations  more difficulty with left hip abduction       Knee/Hip Exercises: Standing   Other Standing Knee Exercises  sit to stand with varying speeds includeing 'power up" x  10 reps     Other Standing Knee Exercises  encouraged pt to stand during commercials on TV and to walk around in her house more              PT Education - 05/11/17 0928    Education provided  Yes    Education Details  meeds decompression and transverse abdominus series     Person(s) Educated  Patient    Methods  Explanation;Demonstration;Handout    Comprehension   Verbalized understanding;Returned demonstration        recert for longer time period sent today   PT Long Term Goals - 05/11/17 1048      PT LONG TERM GOAL #1   Title  Patient will be independent with a home exercise program for left LE strengthening.    Time  2    Period  Weeks    Status  On-going      PT LONG TERM GOAL #2   Title  Pt will decrease TUG score to < 8 seconds indicating an improvement in functional mobility     Time  8    Period  Weeks    Status  New      PT LONG TERM GOAL #3   Title  Pt will report she has 50% less fatigue at home so that she is able to be more involved in family activities.     Time  8    Period  Weeks    Status  New            Plan - 05/11/17 3220    Clinical Impression Statement  Pt returns to PT looking for more generalized exercise as she feels she is deconditioned.  Added goals for this.  Pt recognizes she may not be able to come consistently as she feels very weak especailly the week after chemo , but wants to come more for generalized strengthening.  She feels her ankle is doing ok, but is still weak,  instructed in supine and sidelying home program that she can start with even if she is not feeling great and she will make an effore to walk more and be more acitve at home.      Rehab Potential  Good    Clinical Impairments Affecting Rehab Potential  none    PT Frequency  Other (comment)    PT Next Visit Plan  continue with home program: supine scapular series  Renew at end of cert period for 8 weeks for more long term conditionning. (1 Plan from initial session:  Teach patient a home exercise program that includes strengthening of left LE hip, knee, and ankle muscle groups--see MMT results for what needs focus. Suggest body weight, functional resistance training such as step-ups and downs, single leg stance; also Theraband resistance exericse.)       Patient will benefit from skilled therapeutic intervention in order to improve the  following deficits and impairments:     Visit Diagnosis: Muscle weakness (generalized) - Plan: PT plan of care cert/re-cert  Other abnormalities of gait and mobility - Plan: PT plan of care cert/re-cert     Problem List Patient Active Problem List   Diagnosis Date Noted  . 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([place]) 04/26/2017  . Physical deconditioning 04/26/2017  . Drug-induced skin rash 04/12/2017  . Night sweats 04/12/2017  . Port-A-Cath in place 04/04/2017  . Neuropathy associated with cancer ([place]) 04/04/2017  . Goals of care, counseling/discussion 04/04/2017  . Metastatic squamous cell carcinoma involving liver & sacrum with unknown primary site  03/30/2017  . Metastasis to liver of unknown origin ([place]) 03/18/2017  . Cancer of unknown origin ([place]) 03/11/2017  . Metastasis to liver ([place]) 03/03/2017  . Sacral mass 03/02/2017  . Cancer associated pain 03/02/2017  . Mild single current episode of major depressive disorder ([place]) 08/23/2016  . Cerebral thrombosis with cerebral infarction 12/05/2015  . Stroke (cerebrum) ([place]) 12/05/2015  . Obstructive sleep apnea 07/24/2015  . Preoperative evaluation to rule out surgical contraindication 10/04/2011  . Chest pain, unspecified 06/24/2010  . Shortness of breath 06/24/2010  . Essential hypertension 10/20/2008  . RECTOCELE WITHOUT MENTION OF UTERINE PROLAPSE 10/20/2008  . ENDOMETRIAL POLYP 10/20/2008  . AF (paroxysmal atrial fibrillation) ([place]) 10/20/2008  Donato Heinz. Owens Shark PT  Norwood Levo 05/11/2017, 10:56 AM  [place], [place], [address] Phone: [phone]   Fax:  [phone]  Name: Cindy Faulkner MRN: [id number] Date of Birth: [birth year]

## 2017-05-16 ENCOUNTER — Ambulatory Visit (HOSPITAL_COMMUNITY)
Admission: RE | Admit: 2017-05-16 | Discharge: 2017-05-16 | Disposition: A | Discharge status: Home / Self Care | Payer: 59 | Source: Ambulatory Visit | Attending: Hematology and Oncology | Admitting: Hematology and Oncology

## 2017-05-16 DIAGNOSIS — C801 Malignant (primary) neoplasm, unspecified: Secondary | ICD-10-CM

## 2017-05-16 DIAGNOSIS — K760 Fatty (change of) liver, not elsewhere classified: Secondary | ICD-10-CM | POA: Insufficient documentation

## 2017-05-16 DIAGNOSIS — C787 Secondary malignant neoplasm of liver and intrahepatic bile duct: Secondary | ICD-10-CM | POA: Diagnosis not present

## 2017-05-16 DIAGNOSIS — C7951 Secondary malignant neoplasm of bone: Secondary | ICD-10-CM | POA: Diagnosis not present

## 2017-05-16 DIAGNOSIS — N200 Calculus of kidney: Secondary | ICD-10-CM | POA: Diagnosis not present

## 2017-05-16 DIAGNOSIS — C760 Malignant neoplasm of head, face and neck: Secondary | ICD-10-CM | POA: Diagnosis not present

## 2017-05-16 MED ORDER — IOPAMIDOL (ISOVUE-300) INJECTION 61%
100.0000 mL | Freq: Once | INTRAVENOUS | Status: AC | PRN
  Administered 2017-05-16: 100 mL via INTRAVENOUS

## 2017-05-16 MED ORDER — IOPAMIDOL (ISOVUE-300) INJECTION 61%
INTRAVENOUS | Status: AC
  Filled 2017-05-16: qty 100

## 2017-05-17 ENCOUNTER — Inpatient Hospital Stay: Payer: 59

## 2017-05-17 ENCOUNTER — Inpatient Hospital Stay (HOSPITAL_BASED_OUTPATIENT_CLINIC_OR_DEPARTMENT_OTHER): Payer: 59 | Admitting: Hematology and Oncology

## 2017-05-17 ENCOUNTER — Ambulatory Visit: Payer: 59

## 2017-05-17 ENCOUNTER — Encounter: Payer: Self-pay | Admitting: Hematology and Oncology

## 2017-05-17 ENCOUNTER — Telehealth: Payer: Self-pay | Admitting: Hematology and Oncology

## 2017-05-17 DIAGNOSIS — G893 Neoplasm related pain (acute) (chronic): Secondary | ICD-10-CM | POA: Diagnosis not present

## 2017-05-17 DIAGNOSIS — Z5111 Encounter for antineoplastic chemotherapy: Secondary | ICD-10-CM | POA: Diagnosis not present

## 2017-05-17 DIAGNOSIS — G620 Drug-induced polyneuropathy: Secondary | ICD-10-CM

## 2017-05-17 DIAGNOSIS — Z794 Long term (current) use of insulin: Secondary | ICD-10-CM | POA: Diagnosis not present

## 2017-05-17 DIAGNOSIS — E099 Drug or chemical induced diabetes mellitus without complications: Secondary | ICD-10-CM | POA: Diagnosis not present

## 2017-05-17 DIAGNOSIS — C787 Secondary malignant neoplasm of liver and intrahepatic bile duct: Secondary | ICD-10-CM

## 2017-05-17 DIAGNOSIS — C801 Malignant (primary) neoplasm, unspecified: Secondary | ICD-10-CM

## 2017-05-17 DIAGNOSIS — R784 Finding of other drugs of addictive potential in blood: Secondary | ICD-10-CM | POA: Diagnosis not present

## 2017-05-17 DIAGNOSIS — Z95828 Presence of other vascular implants and grafts: Secondary | ICD-10-CM

## 2017-05-17 DIAGNOSIS — R748 Abnormal levels of other serum enzymes: Secondary | ICD-10-CM | POA: Diagnosis not present

## 2017-05-17 DIAGNOSIS — G63 Polyneuropathy in diseases classified elsewhere: Secondary | ICD-10-CM

## 2017-05-17 DIAGNOSIS — T380X5A Adverse effect of glucocorticoids and synthetic analogues, initial encounter: Secondary | ICD-10-CM

## 2017-05-17 DIAGNOSIS — R5383 Other fatigue: Secondary | ICD-10-CM | POA: Diagnosis not present

## 2017-05-17 LAB — CBC WITH DIFFERENTIAL (CANCER CENTER ONLY)
BASOS ABS: 0 10*3/uL (ref 0.0–0.1)
Basophils Relative: 0 %
Eosinophils Absolute: 0 10*3/uL (ref 0.0–0.5)
Eosinophils Relative: 0 %
HCT: 32.5 % — ABNORMAL LOW (ref 34.8–46.6)
Hemoglobin: 10.9 g/dL — ABNORMAL LOW (ref 11.6–15.9)
LYMPHS ABS: 0.9 10*3/uL (ref 0.9–3.3)
LYMPHS PCT: 11 %
MCH: 28.6 pg (ref 25.1–34.0)
MCHC: 33.6 g/dL (ref 31.5–36.0)
MCV: 85.2 fL (ref 79.5–101.0)
MONO ABS: 0.1 10*3/uL (ref 0.1–0.9)
Monocytes Relative: 1 %
NEUTROS ABS: 7.2 10*3/uL — AB (ref 1.5–6.5)
Neutrophils Relative %: 88 %
Platelet Count: 238 10*3/uL (ref 145–400)
RBC: 3.82 MIL/uL (ref 3.70–5.45)
RDW: 21.5 % — ABNORMAL HIGH (ref 11.2–14.5)
WBC: 8.2 10*3/uL (ref 3.9–10.3)

## 2017-05-17 LAB — CMP (CANCER CENTER ONLY)
ALBUMIN: 3.5 g/dL (ref 3.5–5.0)
ALT: 62 U/L — ABNORMAL HIGH (ref 0–55)
ANION GAP: 14 — AB (ref 3–11)
AST: 41 U/L — AB (ref 5–34)
Alkaline Phosphatase: 85 U/L (ref 40–150)
BILIRUBIN TOTAL: 0.7 mg/dL (ref 0.2–1.2)
BUN: 10 mg/dL (ref 7–26)
CHLORIDE: 98 mmol/L (ref 98–109)
CO2: 25 mmol/L (ref 22–29)
Calcium: 9.1 mg/dL (ref 8.4–10.4)
Creatinine: 0.79 mg/dL (ref 0.60–1.10)
GFR, Est AFR Am: >60 mL/min (ref >60)
GFR, Estimated: >60 mL/min (ref >60)
GLUCOSE: 303 mg/dL — AB (ref 70–140)
POTASSIUM: 4.2 mmol/L (ref 3.5–5.1)
Sodium: 137 mmol/L (ref 136–145)
TOTAL PROTEIN: 7.1 g/dL (ref 6.4–8.3)

## 2017-05-17 LAB — GLUCOSE, RANDOM: GLUCOSE: 200 mg/dL — AB (ref 70–140)

## 2017-05-17 MED ORDER — DIPHENHYDRAMINE HCL 50 MG/ML IJ SOLN
INTRAMUSCULAR | Status: AC
  Filled 2017-05-17: qty 1

## 2017-05-17 MED ORDER — SODIUM CHLORIDE 0.9 % IV SOLN
Freq: Once | INTRAVENOUS | Status: AC
  Administered 2017-05-17: 11:00:00 via INTRAVENOUS

## 2017-05-17 MED ORDER — INSULIN REGULAR HUMAN 100 UNIT/ML IJ SOLN
20.0000 [IU] | Freq: Once | INTRAMUSCULAR | Status: AC
  Administered 2017-05-17: 20 [IU] via SUBCUTANEOUS
  Filled 2017-05-17: qty 0.2

## 2017-05-17 MED ORDER — FAMOTIDINE IN NACL 20-0.9 MG/50ML-% IV SOLN
20.0000 mg | Freq: Once | INTRAVENOUS | Status: AC
  Administered 2017-05-17: 20 mg via INTRAVENOUS

## 2017-05-17 MED ORDER — INSULIN REGULAR HUMAN 100 UNIT/ML IJ SOLN
20.0000 [IU] | Freq: Once | INTRAMUSCULAR | Status: DC
  Filled 2017-05-17: qty 0.2

## 2017-05-17 MED ORDER — PALONOSETRON HCL INJECTION 0.25 MG/5ML
INTRAVENOUS | Status: AC
  Filled 2017-05-17: qty 5

## 2017-05-17 MED ORDER — SODIUM CHLORIDE 0.9% FLUSH
10.0000 mL | Freq: Once | INTRAVENOUS | Status: AC
  Administered 2017-05-17: 10 mL
  Filled 2017-05-17: qty 10

## 2017-05-17 MED ORDER — FOSAPREPITANT DIMEGLUMINE INJECTION 150 MG
Freq: Once | INTRAVENOUS | Status: AC
  Administered 2017-05-17: 11:00:00 via INTRAVENOUS
  Filled 2017-05-17: qty 5

## 2017-05-17 MED ORDER — DIPHENHYDRAMINE HCL 50 MG/ML IJ SOLN
12.5000 mg | Freq: Once | INTRAMUSCULAR | Status: AC
  Administered 2017-05-17: 12.5 mg via INTRAVENOUS

## 2017-05-17 MED ORDER — SODIUM CHLORIDE 0.9 % IV SOLN
105.0000 mg/m2 | Freq: Once | INTRAVENOUS | Status: AC
  Administered 2017-05-17: 252 mg via INTRAVENOUS
  Filled 2017-05-17: qty 42

## 2017-05-17 MED ORDER — PALONOSETRON HCL INJECTION 0.25 MG/5ML
0.2500 mg | Freq: Once | INTRAVENOUS | Status: AC
  Administered 2017-05-17: 0.25 mg via INTRAVENOUS

## 2017-05-17 MED ORDER — HEPARIN SOD (PORK) LOCK FLUSH 100 UNIT/ML IV SOLN
500.0000 [IU] | Freq: Once | INTRAVENOUS | Status: AC | PRN
  Administered 2017-05-17: 500 [IU]
  Filled 2017-05-17: qty 5

## 2017-05-17 MED ORDER — SODIUM CHLORIDE 0.9% FLUSH
10.0000 mL | INTRAVENOUS | Status: DC | PRN
Start: 2017-05-17 — End: 2017-05-17
  Administered 2017-05-17: 10 mL
  Filled 2017-05-17: qty 10

## 2017-05-17 MED ORDER — PROCHLORPERAZINE MALEATE 10 MG PO TABS: 10.0000 mg | ORAL_TABLET | Freq: Four times a day (QID) | ORAL | 1 refills | Status: DC | PRN

## 2017-05-17 MED ORDER — SODIUM CHLORIDE 0.9 % IV SOLN
750.0000 mg | Freq: Once | INTRAVENOUS | Status: AC
  Administered 2017-05-17: 750 mg via INTRAVENOUS
  Filled 2017-05-17: qty 75

## 2017-05-17 MED ORDER — FAMOTIDINE IN NACL 20-0.9 MG/50ML-% IV SOLN
INTRAVENOUS | Status: AC
  Filled 2017-05-17: qty 50

## 2017-05-17 MED FILL — PROCHLORPERAZINE 10 MG TAB: 10 | 22 days supply | Qty: 90 | Fill #0

## 2017-05-17 MED FILL — XARELTO 20 MG TABLET: 20 | 90 days supply | Qty: 90 | Fill #0

## 2017-05-17 MED FILL — ATORVASTATIN 40 MG TABLET: 40 | 90 days supply | Qty: 90 | Fill #2

## 2017-05-17 NOTE — Progress Notes (Signed)
Per Sharlynn Oliphant, RN, Dr. Alvy Bimler aware of repeat glucose result (200 mg/dL). No additional orders received.

## 2017-05-17 NOTE — Patient Instructions (Signed)
   [facility] Discharge Instructions for Patients Receiving Chemotherapy  Today you received the following chemotherapy agents Taxol and Carboplatin   To help prevent nausea and vomiting after your treatment, we encourage you to take your nausea medication as directed.    If you develop nausea and vomiting that is not controlled by your nausea medication, call the clinic.   BELOW ARE SYMPTOMS THAT SHOULD BE REPORTED IMMEDIATELY:  *FEVER GREATER THAN 100.5 F  *CHILLS WITH OR WITHOUT FEVER  NAUSEA AND VOMITING THAT IS NOT CONTROLLED WITH YOUR NAUSEA MEDICATION  *UNUSUAL SHORTNESS OF BREATH  *UNUSUAL BRUISING OR BLEEDING  TENDERNESS IN MOUTH AND THROAT WITH OR WITHOUT PRESENCE OF ULCERS  *URINARY PROBLEMS  *BOWEL PROBLEMS  UNUSUAL RASH Items with * indicate a potential emergency and should be followed up as soon as possible.  Feel free to call the clinic should you have any questions or concerns. The clinic phone number is [phone].  Please show the CHEMO ALERT CARD at check-in to the Emergency Department and triage nurse.   

## 2017-05-17 NOTE — Assessment & Plan Note (Signed)
We discussed recent imaging study and positive response to treatment We also discussed the addition of Avastin due to recent molecular testing suggestive of cervical cancer primary I briefly discussed with them some of the expected risk, benefits, side effects of treatment We will proceed with chemotherapy only today with further dose adjustment of Taxol given elevated liver enzymes and worsening neuropathy affecting her left foot

## 2017-05-17 NOTE — Assessment & Plan Note (Signed)
The liver masses are improving in size She has grossly elevated liver enzymes could be due to fatty liver disease I plan to reduce the dose of Taxol further

## 2017-05-17 NOTE — Assessment & Plan Note (Signed)
I plan to reduce dexamethasone further We will give her 20 units of insulin today We discussed dietary modification

## 2017-05-17 NOTE — Progress Notes (Signed)
[facility] OFFICE PROGRESS NOTE  Patient Care Team: Lennie Odor, PA-C as PCP - General (Nurse Practitioner) Arvella Nigh, MD as Obstetrician (Obstetrics and Gynecology) Evans Lance, MD as Consulting Physician (Cardiology)  ASSESSMENT & PLAN:  Cancer of unknown origin [facility]) We discussed recent imaging study and positive response to treatment We also discussed the addition of Avastin due to recent molecular testing suggestive of cervical cancer primary I briefly discussed with them some of the expected risk, benefits, side effects of treatment We will proceed with chemotherapy only today with further dose adjustment of Taxol given elevated liver enzymes and worsening neuropathy affecting her left foot  Metastasis to liver [facility]) The liver masses are improving in size She has grossly elevated liver enzymes could be due to fatty liver disease I plan to reduce the dose of Taxol further  Neuropathy associated with cancer [facility]) She has severe neuropathy affecting the left foot Plan to reduce the dose of Taxol further She will continue gabapentin and pain medicine as needed She will also continue cancer rehab  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([place]) I plan to reduce dexamethasone further We will give her 20 units of insulin today We discussed dietary modification   No orders of the defined types were placed in this encounter.   INTERVAL HISTORY: Please see below for problem oriented charting. She returns with multiple family members to review test results She complained of severe peripheral neuropathy affecting the left foot She is undergoing cancer rehab for neuropathy and to treat her deconditioning She is attempting dietary modification She has gained a lot of weight She denies recent nausea or vomiting No recent changes in bowel habits The patient denies any recent signs or symptoms of bleeding such as spontaneous epistaxis, hematuria or hematochezia.  SUMMARY OF  ONCOLOGIC HISTORY: Oncology History   Biothernostics: 96% probability cervical cancer  Additional evaluation revealed PTEN deletion, negative for MET amplification, PD-L1 expression of 60     Cancer of unknown origin ([place])   03/03/2017 Imaging    1. Multifocal liver metastasis. 2. Left posterior pelvic mass extending into the obturator foramen measures up to 7 cm. Suspicious for malignancy. This should be easily amendable to percutaneous tissue sampling under image guidance. 3. No lytic bone lesions identified of myeloma.      03/09/2017 Procedure    Technically successful ultrasound-guided core liver lesion biopsy      03/09/2017 Pathology Results    A. LIVER LESION, RIGHT LOBE; ULTRASOUND-GUIDED BIOPSY:  - METASTATIC SQUAMOUS CELL CARCINOMA, SEE COMMENT.   Comment: Metastatic carcinoma is immunoreactive for p40, p16, p53 and pancytokeratin while CK7, CK20, and TTF1 are negative. Significance of p16 positivity depends on the site of origin and the pattern of immunoreactivity in this material is non-specific. Possible sites of origin include gynecologic, anal, lung, and head/neck. Correlation with physical exam and additional imaging is required.       03/18/2017 Pathology Results    She had GYN exam and pap smear. Result is negative for malignancy      03/21/2017 - 04/01/2017 Hospital Admission    She was admitted to the hospital for pain management and had radiation therapy      03/23/2017 Procedure    Ultrasound and fluoroscopically guided right internal jugular single lumen power port catheter insertion. Tip in the SVC/RA junction. Catheter ready for use.      05/16/2017 Imaging    Mild decrease in size of left posterior pelvic soft tissue mass and liver metastases. No new  or progressive metastatic disease identified.  Stable hepatic steatosis and tiny nonobstructing right renal calculus.       REVIEW OF SYSTEMS:   Constitutional: Denies fevers, chills or abnormal weight  loss Eyes: Denies blurriness of vision Ears, nose, mouth, throat, and face: Denies mucositis or sore throat Respiratory: Denies cough, dyspnea or wheezes Cardiovascular: Denies palpitation, chest discomfort or lower extremity swelling Gastrointestinal:  Denies nausea, heartburn or change in bowel habits Skin: Denies abnormal skin rashes Lymphatics: Denies new lymphadenopathy or easy bruising Behavioral/Psych: Mood is stable, no new changes  All other systems were reviewed with the patient and are negative.  I have reviewed the past medical history, past surgical history, social history and family history with the patient and they are unchanged from previous note.  ALLERGIES:  has No Known Allergies.  MEDICATIONS:  Current Outpatient Medications  Medication Sig Dispense Refill  . ALPRAZolam (XANAX) 0.25 MG tablet Take 1 tablet (0.25 mg total) by mouth 3 (three) times daily as needed for sleep. 60 tablet 0  . atorvastatin (LIPITOR) 40 MG tablet Take 1 tablet (40 mg total) by mouth daily at 6 PM. 30 tablet 0  . Biotin 1000 MCG tablet Take 1,000 mcg by mouth daily.    . cholecalciferol (VITAMIN D) 1000 units tablet Take 2,000 Units by mouth daily.    . citalopram (CELEXA) 20 MG tablet Take 20 mg by mouth daily.    Marland Kitchen dexamethasone (DECADRON) 4 MG tablet Take 5 tabs at night with food and 5 tabs at 6 am the morning of chemotherapy, every 3 weeks 60 tablet 0  . flecainide (TAMBOCOR) 50 MG tablet Take 1 tablet (50 mg total) by mouth daily. 90 tablet 3  . furosemide (LASIX) 40 MG tablet Take 40 mg by mouth daily.    Marland Kitchen gabapentin (NEURONTIN) 400 MG capsule Take 1 capsule (400 mg total) by mouth 3 (three) times daily. 90 capsule 0  . Glucosamine-Chondroitin (GLUCOSAMINE CHONDR COMPLEX PO) Take 2 capsules by mouth daily.     Marland Kitchen HYDROmorphone (DILAUDID) 2 MG tablet Take 1-2 tablets (2-4 mg total) by mouth every 3 (three) hours as needed for moderate pain or severe pain. 45 tablet 0  .  lidocaine-prilocaine (EMLA) cream Apply 1 application topically as needed. 30 g 0  . metoprolol tartrate (LOPRESSOR) 50 MG tablet Take 1 tablet (50 mg total) by mouth daily. 90 tablet 3  . Multiple Vitamin (MULTIVITAMIN) tablet Take 2 tablets by mouth daily.     . Omega-3 Fatty Acids (FISH OIL) 1000 MG CAPS Take 2 capsules by mouth daily.      . ondansetron (ZOFRAN) 8 MG tablet Take 1 tablet (8 mg total) by mouth every 8 (eight) hours as needed for nausea. 90 tablet 1  . polyethylene glycol (MIRALAX / GLYCOLAX) packet Take 17 g by mouth daily. 14 each 0  . prochlorperazine (COMPAZINE) 10 MG tablet Take 1 tablet (10 mg total) by mouth every 6 (six) hours as needed (Nausea or vomiting). 90 tablet 1  . rivaroxaban (XARELTO) 20 MG TABS tablet Take 1 tablet (20 mg total) by mouth daily. 30 tablet 0  . senna (SENOKOT) 8.6 MG TABS tablet Take 2 tablets (17.2 mg total) by mouth 2 (two) times daily. (Patient not taking: Reported on 04/13/2017) 120 each 0   No current facility-administered medications for this visit.    Facility-Administered Medications Ordered in Other Visits  Medication Dose Route Frequency Provider Last Rate Last Dose  . sodium chloride flush (NS) 0.9 %  injection 10 mL  10 mL Intracatheter PRN [name], MD   10 mL at 05/17/17 1534    PHYSICAL EXAMINATION: ECOG PERFORMANCE STATUS: 2 - Symptomatic, <50% confined to bed  Vitals:   05/17/17 0932  BP: 129/89  Pulse: 77  Resp: 18  Temp: 97.8 F (36.6 C)  SpO2: 98%   Filed Weights   05/17/17 0932  Weight: 262 lb (118.8 kg)    GENERAL:alert, no distress and comfortable SKIN: skin color, texture, turgor are normal, no rashes or significant lesions EYES: normal, Conjunctiva are pink and non-injected, sclera clear OROPHARYNX:no exudate, no erythema and lips, buccal mucosa, and tongue normal  NECK: supple, thyroid normal size, non-tender, without nodularity LYMPH:  no palpable lymphadenopathy in the cervical, axillary or  inguinal LUNGS: clear to auscultation and percussion with normal breathing effort HEART: regular rate & rhythm and no murmurs and no lower extremity edema ABDOMEN:abdomen soft, non-tender and normal bowel sounds Musculoskeletal:no cyanosis of digits and no clubbing  NEURO: alert & oriented x 3 with fluent speech, no focal motor/sensory deficits  LABORATORY DATA:  I have reviewed the data as listed    Component Value Date/Time   NA 137 05/17/2017 0901   K 4.2 05/17/2017 0901   CL 98 05/17/2017 0901   CO2 25 05/17/2017 0901   GLUCOSE 200 (H) 05/17/2017 1251   BUN 10 05/17/2017 0901   CREATININE 0.79 05/17/2017 0901   CALCIUM 9.1 05/17/2017 0901   PROT 7.1 05/17/2017 0901   ALBUMIN 3.5 05/17/2017 0901   AST 41 (H) 05/17/2017 0901   ALT 62 (H) 05/17/2017 0901   ALKPHOS 85 05/17/2017 0901   BILITOT 0.7 05/17/2017 0901   GFRNONAA >60 05/17/2017 0901   GFRAA >60 05/17/2017 0901    No results found for: SPEP, UPEP  Lab Results  Component Value Date   WBC 8.2 05/17/2017   NEUTROABS 7.2 (H) 05/17/2017   HGB 11.3 (L) 04/26/2017   HCT 32.5 (L) 05/17/2017   MCV 85.2 05/17/2017   PLT 238 05/17/2017      Chemistry      Component Value Date/Time   NA 137 05/17/2017 0901   K 4.2 05/17/2017 0901   CL 98 05/17/2017 0901   CO2 25 05/17/2017 0901   BUN 10 05/17/2017 0901   CREATININE 0.79 05/17/2017 0901      Component Value Date/Time   CALCIUM 9.1 05/17/2017 0901   ALKPHOS 85 05/17/2017 0901   AST 41 (H) 05/17/2017 0901   ALT 62 (H) 05/17/2017 0901   BILITOT 0.7 05/17/2017 0901       RADIOGRAPHIC STUDIES: I have reviewed multiple imaging study with the patient and family I have personally reviewed the radiological images as listed and agreed with the findings in the report. Ct Abdomen Pelvis W Contrast  Result Date: 05/16/2017 CLINICAL DATA:  Followup cervical carcinoma with metastatic disease to sacrum and liver. Undergoing radiation therapy and chemotherapy. Restaging.  EXAM: CT ABDOMEN AND PELVIS WITH CONTRAST TECHNIQUE: Multidetector CT imaging of the abdomen and pelvis was performed using the standard protocol following bolus administration of intravenous contrast. CONTRAST:  159m ISOVUE-300 IOPAMIDOL (ISOVUE-300) INJECTION 61% COMPARISON:  03/03/2017 FINDINGS: Lower Chest: No acute findings. Hepatobiliary: Moderate to severe hepatic steatosis again noted. Hepatic metastases again seen in the right and left lobes, which show mild decrease since previous study. Dominant lesion in the right lobe measures 3.9 x 9.7 cm previously. Another index lesion in segment 3 of the left lobe measures 1.9 cm on image  22/2 compared to 2.3 cm previously. No new or enlarging liver metastases identified. Gallbladder is unremarkable. Pancreas:  No mass or inflammatory changes. Spleen: Within normal limits in size and appearance. Adrenals/Urinary Tract: No masses identified. Several tiny sub-cm renal cysts are again noted. A 2 mm nonobstructing calculus in the midpole the right kidney is stable. No evidence of hydronephrosis. Nearly empty urinary bladder. Stomach/Bowel: No evidence of obstruction, inflammatory process or abnormal fluid collections. Normal appendix visualized. Vascular/Lymphatic: No pathologically enlarged lymph nodes. No abdominal aortic aneurysm. Reproductive: Unremarkable appearance of uterus. Soft tissue mass is again seen in the left posterior pelvis, abutting the sacrum. This measures 6.1 x 4.5 cm on image 79/2, compared to 7.0 x 4.9 cm previously. No new or enlarging masses or ascites. Other:  None. Musculoskeletal:  No suspicious bone lesions identified. IMPRESSION: Mild decrease in size of left posterior pelvic soft tissue mass and liver metastases. No new or progressive metastatic disease identified. Stable hepatic steatosis and tiny nonobstructing right renal calculus. Electronically Signed   By: Earle Gell M.D.   On: 05/16/2017 13:22    All questions were answered.  The patient knows to call the clinic with any problems, questions or concerns. No barriers to learning was detected.  I spent 30 minutes counseling the patient face to face. The total time spent in the appointment was 40 minutes and more than 50% was on counseling and review of test results  [name], MD 05/17/2017 4:08 PM

## 2017-05-17 NOTE — Telephone Encounter (Signed)
Gave patient AVs and calendar of upcoming April and may appointments.  °

## 2017-05-17 NOTE — Assessment & Plan Note (Signed)
She has severe neuropathy affecting the left foot Plan to reduce the dose of Taxol further She will continue gabapentin and pain medicine as needed She will also continue cancer rehab

## 2017-05-19 NOTE — Patient Instructions (Signed)
1. Decompression Exercise     Cancer Rehab [phone]    Lie on back on firm surface, knees bent, feet flat, arms turned up, out to sides, backs of hands down. Time _5-15__ minutes. Surface: floor   2. Shoulder Press    Start in Decompression Exercise position. Press shoulders downward towards supporting surface. Hold __2-3__ seconds while counting out loud. Repeat _3-5___ times. Do _1-2___ times per day.   3. Head Press    Bring cervical spine (neck) into neutral position (by either tucking the chin towards the chest or tilting the chin upward). Feel weight on back of head. Press head downward into supporting surface.    Hold _2-3__ seconds. Repeat _3-5__ times. Do _1-2__ times per day.   4. Leg Lengthener    Straighten one leg. Pull toes AND forefoot toward knee, extend heel. Lengthen leg by pulling pelvis away from ribs. Hold _2-3__ seconds. Relax. Repeat __4-6__ times. Do other leg.  Surface: floor   5. Leg Press    Straighten one leg down to floor keeping leg aligned with hip. Pull toes AND forefoot toward knee; extend heel.  Press entire leg downward (as if pressing leg into sandy beach). DO NOT BEND KNEE. Hold _2-3__ seconds. Do __4-6__ times. Repeat with other leg.    PELVIC TILT  Lie on back, legs bent. Exhale, tilting top of pelvis back, pubic bone up, to flatten lower back. Inhale, rolling pelvis opposite way, top forward, pubic bone down, arch in back. Repeat __10__ times. Do __2__ sessions per day. Copyright  VHI. All rights reserved.     Lie with hips and knees bent. Slowly inhale, and then exhale. Pull navel toward spine and tighten pelvic floor. Hold for __10_ seconds. Continue to breathe in and out during hold. Rest for _10__ seconds. Repeat __10_ times. Do __2-3_ times a day.   Copyright  VHI. All rights reserved.  Knee Fold   Lie on back, legs bent, arms by sides. Exhale, lifting knee to chest. Inhale, returning. Keep abdominals flat, navel to  spine. Repeat __10__ times, alternating legs. Do __2__ sessions per day.  Copyright  VHI. All rights reserved.  Knee Drop   Keep pelvis stable. Without rotating hips, slowly drop knee to side, pause, return to center, bring knee across midline toward opposite hip. Feel obliques engaging. Repeat for ___10_ times each leg.  Copyright  VHI. All rights reserved.  Isometric Hold With Pelvic Floor (Hook-Lying)    Copyright  VHI. All rights reserved.  Heel Slide to Straight   Slide one leg down to straight. Return. Be sure pelvis does not rock forward, tilt, rotate, or tip to side. Do _10__ times. Restabilize pelvis. Repeat with other leg. Do __1-2_ sets, __2_ times per day.  http://ss.exer.us/16   Copyright  VHI. All rights reserved.  

## 2017-05-19 NOTE — Therapy (Signed)
Another note was created for this visit so the documentation from this encounter was merged into the other encounter. This encounter should be deleted. Annia Friendly, [place] 05/19/17 10:18 AM

## 2017-05-26 ENCOUNTER — Ambulatory Visit: Payer: 59 | Attending: Hematology and Oncology | Admitting: Physical Therapy

## 2017-05-26 ENCOUNTER — Encounter: Payer: Self-pay | Admitting: Physical Therapy

## 2017-05-26 DIAGNOSIS — R2689 Other abnormalities of gait and mobility: Secondary | ICD-10-CM | POA: Diagnosis not present

## 2017-05-26 DIAGNOSIS — M6281 Muscle weakness (generalized): Secondary | ICD-10-CM | POA: Insufficient documentation

## 2017-05-26 NOTE — Patient Instructions (Signed)
Over Head Pull: Narrow and Wide Grip   Cancer Rehab [phone]   On back, knees bent, feet flat, band across thighs, elbows straight but relaxed. Pull hands apart (start). Keeping elbows straight, bring arms up and over head, hands toward floor. Keep pull steady on band. Hold momentarily. Return slowly, keeping pull steady, back to start. Then do same with a wider grip on the band (past shoulder width) Repeat _5-10__ times. Band color __yellow____   Side Pull: Double Arm   On back, knees bent, feet flat. Arms perpendicular to body, shoulder level, elbows straight but relaxed. Pull arms out to sides, elbows straight. Resistance band comes across collarbones, hands toward floor. Hold momentarily. Slowly return to starting position. Repeat _5-10__ times. Band color _yellow____   Sword   On back, knees bent, feet flat, left hand on left hip, right hand above left. Pull right arm DIAGONALLY (hip to shoulder) across chest. Bring right arm along head toward floor. Hold momentarily. Slowly return to starting position. Repeat _5-10__ times. Do with left arm. Band color _yellow_____   Shoulder Rotation: Double Arm   On back, knees bent, feet flat, elbows tucked at sides, bent 90, hands palms up. Pull hands apart and down toward floor, keeping elbows near sides. Hold momentarily. Slowly return to starting position. Repeat _5-10__ times. Band color __yellow____    Hip Abduction: Lie on your back with your knees bent and feet flat. Tie yellow theraband around thighs. Slowly move thighs apart (clam shell) and back together. Do this 10x.

## 2017-05-26 NOTE — Therapy (Signed)
[place], [place], [address] Phone: [phone]   Fax:  [phone]  Physical Therapy Treatment  Patient Details  Name: Cindy Faulkner MRN: [id number] Date of Birth: October 18, 1965 Referring Provider: Dr. Alvy Bimler    Encounter Date: 05/26/2017  PT End of Session - 05/26/17 1152    Visit Number  4    Number of Visits  18    Date for PT Re-Evaluation  07/11/17    PT Start Time  1109    PT Stop Time  1150    PT Time Calculation (min)  41 min    Activity Tolerance  Patient tolerated treatment well    Behavior During Therapy  [facility] for tasks assessed/performed       Past Medical History:  Diagnosis Date  . Atrial fibrillation ([place])    a. echo 4/07: EF 60%  . Atrial tachycardia ([place])    a. s/p EPS 4/09: no inducible SVT - med Tx continued  . Complication of anesthesia    age 89yo, woke during procedure with GA, paralysed   . Endometrial polyp   . Hyperlipidemia   . Hypertension   . PONV (postoperative nausea and vomiting)   . Rectocele    WITHOUT MENTION OF UTERINE PROLAPSE  . Stroke ([place])   . SVD (spontaneous vaginal delivery)    x 1    Past Surgical History:  Procedure Laterality Date  . COLONOSCOPY    . HYSTEROSCOPY    . IR FLUORO GUIDE PORT INSERTION RIGHT  03/23/2017  . IR US GUIDE VASC ACCESS RIGHT  03/23/2017  . LEEP    . Pylonidal cystect    . TUBAL LIGATION    . WISDOM TOOTH EXTRACTION      There were no vitals filed for this visit.  Subjective Assessment - 05/26/17 1111    Subjective  I am doing okay with my exercises. I am not doing them every single day but I am doing good. I am still having chemo every 3 weeks.     Pertinent History  [place]winge of back pain one day led to increased pain by later that day; saw chiropractor for one month; saw orthopedist and got injection, which helped x 1 day.  Had MRI and that found the tumor in December and was sent to neurosurgeon.  Neurosurgeon  couldn't do surgery; pt. was sent to oncologist.  Liver lesions were found and one is large, so can't do surgery. Has had one chemo and will have this q 3 weeks x 6. h/o a-fib and a stroke in the past with little or no residual. Is on medication for a-fib with good control.     Patient Stated Goals  do what she can to stay strong, and stay ambulatory    Currently in Pain?  No/denies    Pain Score  0-No pain                       [facility] Adult PT Treatment/Exercise - 05/26/17 0001      Lumbar Exercises: Supine   Pelvic Tilt  10 reps;5 seconds    Clam  10 reps with yellow theraband, moving slowly    Heel Slides  10 reps bilaterally    Bent Knee Raise  10 reps    Bridge with Ball Squeeze  10 reps 2 sets of 5    Other Supine Lumbar Exercises  ball squeeze with 5 sec holds x 20 reps  Other Supine Lumbar Exercises  Meeks decompression series       Knee/Hip Exercises: Supine   Short Arc Quad Sets  Strengthening;Both;1 set;10 reps 2lb ankle weight on R and 1 lb on L      Shoulder Exercises: Supine   Horizontal ABduction  Strengthening;Both;10 reps;Theraband    Theraband Level (Shoulder Horizontal ABduction)  Level 1 (Yellow)    External Rotation  Strengthening;Both;10 reps;Theraband    Theraband Level (Shoulder External Rotation)  Level 1 (Yellow)    Flexion  Strengthening;Both;10 reps;Theraband narrow and wide grip    Theraband Level (Shoulder Flexion)  Level 1 (Yellow)    Other Supine Exercises  D2 with yellow band x 10 reps bilaterally      Ankle Exercises: Seated   ABC's  1 rep    Other Seated Ankle Exercises  on L: plantar flexion with yellow band, DF no resistance, Eversion and Inversion x 10 reps each                  PT Long Term Goals - 05/11/17 1048      PT LONG TERM GOAL #1   Title  Patient will be independent with a home exercise program for left LE strengthening.    Time  2    Period  Weeks    Status  On-going      PT LONG TERM GOAL #2    Title  Pt will decrease TUG score to < 8 seconds indicating an improvement in functional mobility     Time  8    Period  Weeks    Status  New      PT LONG TERM GOAL #3   Title  Pt will report she has 50% less fatigue at home so that she is able to be more involved in family activities.     Time  8    Period  Weeks    Status  New            Plan - 05/26/17 1152    Clinical Impression Statement  Continued with supine strengthening exercises and added scapular stabilization exercises to pt's home exercise program. Also instructed pt in four way ankle exercise for left ankle weakness. Pt also having increase LLE weakness so added short arc quads with 1 lb ankle weight on L. Pt feelings very fatigued today but was able to complete exercises. Educated pt to draw ABCs with left ankle without moving her LLE.     Rehab Potential  Good    Clinical Impairments Affecting Rehab Potential  none    PT Frequency  Other (comment)    PT Treatment/Interventions  ADLs/Self Care Home Management;Therapeutic exercise;Patient/family education    PT Next Visit Plan  assess indep with supine scap,   Renew at end of cert period for 8 weeks for more long term conditionning. (1 Plan from initial session: Teach patient a home exercise program that includes strengthening of left LE hip, knee, and ankle muscle groups--see MMT results for what needs focus. Suggest body weight, functional resistance training such as step-ups and downs, single leg stance; also Theraband resistance exericse.)    PT Home Exercise Plan  ankle 4 way, ankle ABC, supine scap, hip abd in supine with yellow band    Consulted and Agree with Plan of Care  Patient       Patient will benefit from skilled therapeutic intervention in order to improve the following deficits and impairments:  Abnormal gait, Decreased strength, Pain  Visit Diagnosis: Muscle weakness (generalized)     Problem List Patient Active Problem List   Diagnosis Date  Noted  . 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([place]) 04/26/2017  . Physical deconditioning 04/26/2017  . Drug-induced skin rash 04/12/2017  . Night sweats 04/12/2017  . Port-A-Cath in place 04/04/2017  . Neuropathy associated with cancer ([place]) 04/04/2017  . Goals of care, counseling/discussion 04/04/2017  . Metastatic squamous cell carcinoma involving liver & sacrum with unknown primary site  03/30/2017  . Metastasis to liver of unknown origin ([place]) 03/18/2017  . Cancer of unknown origin ([place]) 03/11/2017  . Metastasis to liver ([place]) 03/03/2017  . Sacral mass 03/02/2017  . Cancer associated pain 03/02/2017  . Mild single current episode of major depressive disorder ([place]) 08/23/2016  . Cerebral thrombosis with cerebral infarction 12/05/2015  . Stroke (cerebrum) ([place]) 12/05/2015  . Obstructive sleep apnea 07/24/2015  . Preoperative evaluation to rule out surgical contraindication 10/04/2011  . Chest pain, unspecified 06/24/2010  . Shortness of breath 06/24/2010  . Essential hypertension 10/20/2008  . RECTOCELE WITHOUT MENTION OF UTERINE PROLAPSE 10/20/2008  . ENDOMETRIAL POLYP 10/20/2008  . AF (paroxysmal atrial fibrillation) ([place]) 10/20/2008    Allyson Sabal [facility] 05/26/2017, 11:55 AM  [place], [place], [address] Phone: [phone]   Fax:  [phone]  Name: ISOBELLE TUCKETT MRN: [id number] Date of Birth: [birth year]  Manus Gunning, PT 05/26/17 11:55 AM

## 2017-06-03 ENCOUNTER — Ambulatory Visit: Payer: 59 | Admitting: Physical Therapy

## 2017-06-03 DIAGNOSIS — R2689 Other abnormalities of gait and mobility: Secondary | ICD-10-CM | POA: Diagnosis not present

## 2017-06-03 DIAGNOSIS — M6281 Muscle weakness (generalized): Secondary | ICD-10-CM

## 2017-06-03 NOTE — Therapy (Signed)
[place], [place], [address] Phone: [phone]   Fax:  [phone]  Physical Therapy Treatment  Patient Details  Name: Cindy Faulkner MRN: [id number] Date of Birth: 1965/05/25 Referring Provider: Dr. Alvy Bimler    Encounter Date: 06/03/2017  PT End of Session - 06/03/17 1116    Visit Number  5    Number of Visits  18    Date for PT Re-Evaluation  07/11/17    PT Start Time  1024    PT Stop Time  1104    PT Time Calculation (min)  40 min    Activity Tolerance  Patient tolerated treatment well    Behavior During Therapy  [facility] for tasks assessed/performed       Past Medical History:  Diagnosis Date  . Atrial fibrillation ([place])    a. echo 4/07: EF 60%  . Atrial tachycardia ([place])    a. s/p EPS 4/09: no inducible SVT - med Tx continued  . Complication of anesthesia    age 30yo, woke during procedure with GA, paralysed   . Endometrial polyp   . Hyperlipidemia   . Hypertension   . PONV (postoperative nausea and vomiting)   . Rectocele    WITHOUT MENTION OF UTERINE PROLAPSE  . Stroke ([place])   . SVD (spontaneous vaginal delivery)    x 1    Past Surgical History:  Procedure Laterality Date  . COLONOSCOPY    . HYSTEROSCOPY    . IR FLUORO GUIDE PORT INSERTION RIGHT  03/23/2017  . IR US GUIDE VASC ACCESS RIGHT  03/23/2017  . LEEP    . Pylonidal cystect    . TUBAL LIGATION    . WISDOM TOOTH EXTRACTION      There were no vitals filed for this visit.  Subjective Assessment - 06/03/17 1027    Subjective  "My leg feels more numb the last 2-3 days than it had been, especially the left outside and I feel like I'm dragging it more."     Pertinent History  [place]winge of back pain one day led to increased pain by later that day; saw chiropractor for one month; saw orthopedist and got injection, which helped x 1 day.  Had MRI and that found the tumor in December and was sent to neurosurgeon.  Neurosurgeon couldn't  do surgery; pt. was sent to oncologist.  Liver lesions were found and one is large, so can't do surgery. Has had one chemo and will have this q 3 weeks x 6. h/o a-fib and a stroke in the past with little or no residual. Is on medication for a-fib with good control.     Currently in Pain?  No/denies                       [facility] Adult PT Treatment/Exercise - 06/03/17 0001      Lumbar Exercises: Supine   Pelvic Tilt  10 reps;5 seconds    Clam  10 reps with yellow theraband, moving slowly    Heel Slides  10 reps bilaterally    Bent Knee Raise  10 reps 10 on each side    Bridge with [employer]  10 reps 2 sets of 5    Other Supine Lumbar Exercises  ball squeeze with 5 sec holds x 20 reps      Knee/Hip Exercises: Standing   Other Standing Knee Exercises  sit to stand without arm push-off x 10  Other Standing Knee Exercises  sideways walk with yellow Theraband around thighs x 15 feet each way this was very fatiguing for patient      Ankle Exercises: Standing   SLS  in corner on each foot x 30 seconds; pt. used UEs to catch herself several times when on left foot    Rocker Board  -- fwd-back and side to side x 10 each    Rocker Board Limitations  done on treadmill to use hand rails; both were pretty easy for her    Heel Raises  Both;20 reps;1 second hands on bike seat back for support    Other Standing Ankle Exercises  standing on Airex foam pad in corner, balance with eyes closed x 30 seconds, then tandem stance with eyes open x 30 seconds each with right/left foot forward pt. reported full leg workout with this                  PT Long Term Goals - 05/11/17 1048      PT LONG TERM GOAL #1   Title  Patient will be independent with a home exercise program for left LE strengthening.    Time  2    Period  Weeks    Status  On-going      PT LONG TERM GOAL #2   Title  Pt will decrease TUG score to < 8 seconds indicating an improvement in functional mobility      Time  8    Period  Weeks    Status  New      PT LONG TERM GOAL #3   Title  Pt will report she has 50% less fatigue at home so that she is able to be more involved in family activities.     Time  8    Period  Weeks    Status  New            Plan - 06/03/17 1117    Clinical Impression Statement  Pt. did well with exercise today but needed frequent monitored rests due to shortness of breath and fatigue.  Exercise required monitoring to ensure that she was challenged so that she would benefit from it, but so that she would not be unsafe in performing more than she could tolerate. She felt she got a good workout today, but couldn't do more.    Rehab Potential  Good    PT Frequency  2x / week    PT Duration  8 weeks    PT Next Visit Plan  continue general conditioning/strengthening    PT Home Exercise Plan  ankle 4 way, ankle ABC, supine scap, hip abd in supine with yellow band    Consulted and Agree with Plan of Care  Patient       Patient will benefit from skilled therapeutic intervention in order to improve the following deficits and impairments:  Abnormal gait, Decreased strength, Pain  Visit Diagnosis: Muscle weakness (generalized)  Other abnormalities of gait and mobility     Problem List Patient Active Problem List   Diagnosis Date Noted  . 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([place]) 04/26/2017  . Physical deconditioning 04/26/2017  . Drug-induced skin rash 04/12/2017  . Night sweats 04/12/2017  . Port-A-Cath in place 04/04/2017  . Neuropathy associated with cancer ([place]) 04/04/2017  . Goals of care, counseling/discussion 04/04/2017  . Metastatic squamous cell carcinoma involving liver & sacrum with unknown primary site  03/30/2017  . Metastasis to liver of unknown origin ([place]) 03/18/2017  .  Cancer of unknown origin ([place]) 03/11/2017  . Metastasis to liver ([place]) 03/03/2017  . Sacral mass 03/02/2017  . Cancer associated pain 03/02/2017  . Mild single current episode of major  depressive disorder ([place]) 08/23/2016  . Cerebral thrombosis with cerebral infarction 12/05/2015  . Stroke (cerebrum) ([place]) 12/05/2015  . Obstructive sleep apnea 07/24/2015  . Preoperative evaluation to rule out surgical contraindication 10/04/2011  . Chest pain, unspecified 06/24/2010  . Shortness of breath 06/24/2010  . Essential hypertension 10/20/2008  . RECTOCELE WITHOUT MENTION OF UTERINE PROLAPSE 10/20/2008  . ENDOMETRIAL POLYP 10/20/2008  . AF (paroxysmal atrial fibrillation) ([place]) 10/20/2008    SALISBURY,DONNA 06/03/2017, 12:09 PM  [place], [place], [address] Phone: [phone]   Fax:  [phone]  Name: MALIN SAMBRANO MRN: [id number] Date of Birth: 1965-03-04  Serafina Royals, PT 06/03/17 12:09 PM

## 2017-06-06 MED FILL — clonazePAM 0.5 MG TABS: 0.5 | 30 days supply | Qty: 30 | Fill #0

## 2017-06-07 ENCOUNTER — Inpatient Hospital Stay: Payer: 59

## 2017-06-07 ENCOUNTER — Inpatient Hospital Stay (HOSPITAL_BASED_OUTPATIENT_CLINIC_OR_DEPARTMENT_OTHER): Payer: 59 | Admitting: Hematology and Oncology

## 2017-06-07 ENCOUNTER — Inpatient Hospital Stay: Payer: 59 | Attending: Hematology and Oncology

## 2017-06-07 ENCOUNTER — Encounter: Payer: Self-pay | Admitting: Hematology and Oncology

## 2017-06-07 DIAGNOSIS — E099 Drug or chemical induced diabetes mellitus without complications: Secondary | ICD-10-CM | POA: Diagnosis not present

## 2017-06-07 DIAGNOSIS — C787 Secondary malignant neoplasm of liver and intrahepatic bile duct: Secondary | ICD-10-CM

## 2017-06-07 DIAGNOSIS — T380X5A Adverse effect of glucocorticoids and synthetic analogues, initial encounter: Secondary | ICD-10-CM

## 2017-06-07 DIAGNOSIS — Z5111 Encounter for antineoplastic chemotherapy: Secondary | ICD-10-CM | POA: Diagnosis not present

## 2017-06-07 DIAGNOSIS — R634 Abnormal weight loss: Secondary | ICD-10-CM | POA: Diagnosis not present

## 2017-06-07 DIAGNOSIS — Z923 Personal history of irradiation: Secondary | ICD-10-CM | POA: Diagnosis not present

## 2017-06-07 DIAGNOSIS — C801 Malignant (primary) neoplasm, unspecified: Secondary | ICD-10-CM | POA: Diagnosis not present

## 2017-06-07 DIAGNOSIS — Z7901 Long term (current) use of anticoagulants: Secondary | ICD-10-CM | POA: Insufficient documentation

## 2017-06-07 DIAGNOSIS — G620 Drug-induced polyneuropathy: Secondary | ICD-10-CM | POA: Diagnosis not present

## 2017-06-07 DIAGNOSIS — Z95828 Presence of other vascular implants and grafts: Secondary | ICD-10-CM

## 2017-06-07 DIAGNOSIS — Z79899 Other long term (current) drug therapy: Secondary | ICD-10-CM | POA: Insufficient documentation

## 2017-06-07 DIAGNOSIS — R5381 Other malaise: Secondary | ICD-10-CM

## 2017-06-07 DIAGNOSIS — R531 Weakness: Secondary | ICD-10-CM | POA: Diagnosis not present

## 2017-06-07 LAB — CMP (CANCER CENTER ONLY)
ALT: 49 U/L (ref 0–55)
ANION GAP: 14 — AB (ref 3–11)
AST: 33 U/L (ref 5–34)
Albumin: 3.6 g/dL (ref 3.5–5.0)
Alkaline Phosphatase: 86 U/L (ref 40–150)
BUN: 10 mg/dL (ref 7–26)
CHLORIDE: 99 mmol/L (ref 98–109)
CO2: 25 mmol/L (ref 22–29)
CREATININE: 0.75 mg/dL (ref 0.60–1.10)
Calcium: 8.8 mg/dL (ref 8.4–10.4)
GFR, Estimated: >60 mL/min (ref >60)
Glucose, Bld: 193 mg/dL — ABNORMAL HIGH (ref 70–140)
Potassium: 3.4 mmol/L — ABNORMAL LOW (ref 3.5–5.1)
SODIUM: 138 mmol/L (ref 136–145)
Total Bilirubin: 0.6 mg/dL (ref 0.2–1.2)
Total Protein: 7.2 g/dL (ref 6.4–8.3)

## 2017-06-07 LAB — CBC WITH DIFFERENTIAL (CANCER CENTER ONLY)
Basophils Absolute: 0 10*3/uL (ref 0.0–0.1)
Basophils Relative: 1 %
EOS ABS: 0 10*3/uL (ref 0.0–0.5)
EOS PCT: 0 %
HCT: 33.7 % — ABNORMAL LOW (ref 34.8–46.6)
Hemoglobin: 11.3 g/dL — ABNORMAL LOW (ref 11.6–15.9)
LYMPHS ABS: 1.1 10*3/uL (ref 0.9–3.3)
LYMPHS PCT: 17 %
MCH: 29.8 pg (ref 25.1–34.0)
MCHC: 33.6 g/dL (ref 31.5–36.0)
MCV: 88.6 fL (ref 79.5–101.0)
MONO ABS: 0.1 10*3/uL (ref 0.1–0.9)
MONOS PCT: 2 %
Neutro Abs: 5.3 10*3/uL (ref 1.5–6.5)
Neutrophils Relative %: 80 %
PLATELETS: 241 10*3/uL (ref 145–400)
RBC: 3.8 MIL/uL (ref 3.70–5.45)
RDW: 22.9 % — AB (ref 11.2–14.5)
WBC: 6.5 10*3/uL (ref 3.9–10.3)

## 2017-06-07 MED ORDER — FAMOTIDINE IN NACL 20-0.9 MG/50ML-% IV SOLN
20.0000 mg | Freq: Once | INTRAVENOUS | Status: AC
  Administered 2017-06-07: 20 mg via INTRAVENOUS

## 2017-06-07 MED ORDER — FAMOTIDINE IN NACL 20-0.9 MG/50ML-% IV SOLN
INTRAVENOUS | Status: AC
Start: 2017-06-07 — End: ?
  Filled 2017-06-07: qty 50

## 2017-06-07 MED ORDER — DIPHENHYDRAMINE HCL 50 MG/ML IJ SOLN
12.5000 mg | Freq: Once | INTRAMUSCULAR | Status: AC
  Administered 2017-06-07: 12.5 mg via INTRAVENOUS

## 2017-06-07 MED ORDER — SODIUM CHLORIDE 0.9 % IV SOLN
Freq: Once | INTRAVENOUS | Status: AC
Start: 2017-06-07 — End: 2017-06-07
  Administered 2017-06-07: 11:00:00 via INTRAVENOUS

## 2017-06-07 MED ORDER — PALONOSETRON HCL INJECTION 0.25 MG/5ML
INTRAVENOUS | Status: AC
  Filled 2017-06-07: qty 5

## 2017-06-07 MED ORDER — SODIUM CHLORIDE 0.9 % IV SOLN
87.5000 mg/m2 | Freq: Once | INTRAVENOUS | Status: AC
  Administered 2017-06-07: 210 mg via INTRAVENOUS
  Filled 2017-06-07: qty 35

## 2017-06-07 MED ORDER — SODIUM CHLORIDE 0.9% FLUSH
10.0000 mL | Freq: Once | INTRAVENOUS | Status: AC
  Administered 2017-06-07: 10 mL
  Filled 2017-06-07: qty 10

## 2017-06-07 MED ORDER — SODIUM CHLORIDE 0.9% FLUSH
10.0000 mL | INTRAVENOUS | Status: DC | PRN
  Administered 2017-06-07: 10 mL
  Filled 2017-06-07: qty 10

## 2017-06-07 MED ORDER — HEPARIN SOD (PORK) LOCK FLUSH 100 UNIT/ML IV SOLN
500.0000 [IU] | Freq: Once | INTRAVENOUS | Status: AC | PRN
  Administered 2017-06-07: 500 [IU]
  Filled 2017-06-07: qty 5

## 2017-06-07 MED ORDER — DIPHENHYDRAMINE HCL 50 MG/ML IJ SOLN
INTRAMUSCULAR | Status: AC
  Filled 2017-06-07: qty 1

## 2017-06-07 MED ORDER — SODIUM CHLORIDE 0.9 % IV SOLN
Freq: Once | INTRAVENOUS | Status: AC
  Administered 2017-06-07: 11:00:00 via INTRAVENOUS
  Filled 2017-06-07: qty 5

## 2017-06-07 MED ORDER — SODIUM CHLORIDE 0.9 % IV SOLN
750.0000 mg | Freq: Once | INTRAVENOUS | Status: AC
  Administered 2017-06-07: 750 mg via INTRAVENOUS
  Filled 2017-06-07: qty 75

## 2017-06-07 MED ORDER — PALONOSETRON HCL INJECTION 0.25 MG/5ML
0.2500 mg | Freq: Once | INTRAVENOUS | Status: AC
  Administered 2017-06-07: 0.25 mg via INTRAVENOUS

## 2017-06-07 NOTE — Assessment & Plan Note (Signed)
The liver masses are improving in size Her liver enzymes had normalized We will continue to monitor her liver function carefully

## 2017-06-07 NOTE — Patient Instructions (Signed)
   [facility] Discharge Instructions for Patients Receiving Chemotherapy  Today you received the following chemotherapy agents Taxol and Carboplatin   To help prevent nausea and vomiting after your treatment, we encourage you to take your nausea medication as directed.    If you develop nausea and vomiting that is not controlled by your nausea medication, call the clinic.   BELOW ARE SYMPTOMS THAT SHOULD BE REPORTED IMMEDIATELY:  *FEVER GREATER THAN 100.5 F  *CHILLS WITH OR WITHOUT FEVER  NAUSEA AND VOMITING THAT IS NOT CONTROLLED WITH YOUR NAUSEA MEDICATION  *UNUSUAL SHORTNESS OF BREATH  *UNUSUAL BRUISING OR BLEEDING  TENDERNESS IN MOUTH AND THROAT WITH OR WITHOUT PRESENCE OF ULCERS  *URINARY PROBLEMS  *BOWEL PROBLEMS  UNUSUAL RASH Items with * indicate a potential emergency and should be followed up as soon as possible.  Feel free to call the clinic should you have any questions or concerns. The clinic phone number is [phone].  Please show the CHEMO ALERT CARD at check-in to the Emergency Department and triage nurse.   

## 2017-06-07 NOTE — Assessment & Plan Note (Signed)
She tolerated treatment well except for some persistent weakness and neuropathy affecting the left foot We will continue similar dose without dose adjustment Due to her indeterminate decision about Avastin, I recommend we proceed to complete 6 cycles of chemotherapy and repeat staging scan before we decide whether she wants to go on maintenance treatment with Avastin

## 2017-06-07 NOTE — Assessment & Plan Note (Signed)
Her blood sugar has improved since recent weight loss and dietary modification.  She will continue the same

## 2017-06-07 NOTE — Progress Notes (Signed)
[place] OFFICE PROGRESS NOTE  Patient Care Team: Lennie Odor, PA-C as PCP - General (Nurse Practitioner) Arvella Nigh, MD as Obstetrician (Obstetrics and Gynecology) Evans Lance, MD as Consulting Physician (Cardiology)  ASSESSMENT & PLAN:  Cancer of unknown origin [facility]) She tolerated treatment well except for some persistent weakness and neuropathy affecting the left foot We will continue similar dose without dose adjustment Due to her indeterminate decision about Avastin, I recommend we proceed to complete 6 cycles of chemotherapy and repeat staging scan before we decide whether she wants to go on maintenance treatment with Avastin  Metastasis to liver [facility]) The liver masses are improving in size Her liver enzymes had normalized We will continue to monitor her liver function carefully  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([place]) Her blood sugar has improved since recent weight loss and dietary modification.  She will continue the same  Physical deconditioning She has profound clinical deconditioning She will continue physical therapy and rehab   No orders of the defined types were placed in this encounter.   INTERVAL HISTORY: Please see below for problem oriented charting. She returns for further follow-up and for cycle 4 of chemotherapy She has persistent neuropathy and foot drop affecting the left foot only. No recent nausea vomiting Her appetite is stable and she is consciously reducing snacks and sugary intake She has lost some weight She continues physical therapy and rehabilitation  SUMMARY OF ONCOLOGIC HISTORY: Oncology History   Biothernostics: 96% probability cervical cancer  Additional evaluation revealed PTEN deletion, negative for MET amplification, PD-L1 expression of 60     Cancer of unknown origin ([place])   03/03/2017 Imaging    1. Multifocal liver metastasis. 2. Left posterior pelvic mass extending into the obturator foramen measures up to 7  cm. Suspicious for malignancy. This should be easily amendable to percutaneous tissue sampling under image guidance. 3. No lytic bone lesions identified of myeloma.      03/09/2017 Procedure    Technically successful ultrasound-guided core liver lesion biopsy      03/09/2017 Pathology Results    A. LIVER LESION, RIGHT LOBE; ULTRASOUND-GUIDED BIOPSY:  - METASTATIC SQUAMOUS CELL CARCINOMA, SEE COMMENT.   Comment: Metastatic carcinoma is immunoreactive for p40, p16, p53 and pancytokeratin while CK7, CK20, and TTF1 are negative. Significance of p16 positivity depends on the site of origin and the pattern of immunoreactivity in this material is non-specific. Possible sites of origin include gynecologic, anal, lung, and head/neck. Correlation with physical exam and additional imaging is required.       03/18/2017 Pathology Results    She had GYN exam and pap smear. Result is negative for malignancy      03/21/2017 - 04/01/2017 Hospital Admission    She was admitted to the hospital for pain management and had radiation therapy      03/23/2017 Procedure    Ultrasound and fluoroscopically guided right internal jugular single lumen power port catheter insertion. Tip in the SVC/RA junction. Catheter ready for use.      05/16/2017 Imaging    Mild decrease in size of left posterior pelvic soft tissue mass and liver metastases. No new or progressive metastatic disease identified.  Stable hepatic steatosis and tiny nonobstructing right renal calculus.       REVIEW OF SYSTEMS:   Constitutional: Denies fevers, chills or abnormal weight loss Eyes: Denies blurriness of vision Ears, nose, mouth, throat, and face: Denies mucositis or sore throat Respiratory: Denies cough, dyspnea or wheezes Cardiovascular: Denies palpitation, chest discomfort or  lower extremity swelling Gastrointestinal:  Denies nausea, heartburn or change in bowel habits Skin: Denies abnormal skin rashes Lymphatics: Denies new  lymphadenopathy or easy bruising Neurological:Denies numbness, tingling or new weaknesses Behavioral/Psych: Mood is stable, no new changes  All other systems were reviewed with the patient and are negative.  I have reviewed the past medical history, past surgical history, social history and family history with the patient and they are unchanged from previous note.  ALLERGIES:  has No Known Allergies.  MEDICATIONS:  Current Outpatient Medications  Medication Sig Dispense Refill  . ALPRAZolam (XANAX) 0.25 MG tablet Take 1 tablet (0.25 mg total) by mouth 3 (three) times daily as needed for sleep. 60 tablet 0  . atorvastatin (LIPITOR) 40 MG tablet Take 1 tablet (40 mg total) by mouth daily at 6 PM. 30 tablet 0  . Biotin 1000 MCG tablet Take 1,000 mcg by mouth daily.    . cholecalciferol (VITAMIN D) 1000 units tablet Take 2,000 Units by mouth daily.    . citalopram (CELEXA) 20 MG tablet Take 20 mg by mouth daily.    Marland Kitchen dexamethasone (DECADRON) 4 MG tablet Take 5 tabs at night with food and 5 tabs at 6 am the morning of chemotherapy, every 3 weeks 60 tablet 0  . flecainide (TAMBOCOR) 50 MG tablet Take 1 tablet (50 mg total) by mouth daily. 90 tablet 3  . furosemide (LASIX) 40 MG tablet Take 40 mg by mouth daily.    Marland Kitchen gabapentin (NEURONTIN) 400 MG capsule Take 1 capsule (400 mg total) by mouth 3 (three) times daily. 90 capsule 0  . Glucosamine-Chondroitin (GLUCOSAMINE CHONDR COMPLEX PO) Take 2 capsules by mouth daily.     Marland Kitchen HYDROmorphone (DILAUDID) 2 MG tablet Take 1-2 tablets (2-4 mg total) by mouth every 3 (three) hours as needed for moderate pain or severe pain. 45 tablet 0  . lidocaine-prilocaine (EMLA) cream Apply 1 application topically as needed. 30 g 0  . metoprolol tartrate (LOPRESSOR) 50 MG tablet Take 1 tablet (50 mg total) by mouth daily. 90 tablet 3  . Multiple Vitamin (MULTIVITAMIN) tablet Take 2 tablets by mouth daily.     . Omega-3 Fatty Acids (FISH OIL) 1000 MG CAPS Take 2 capsules  by mouth daily.      . ondansetron (ZOFRAN) 8 MG tablet Take 1 tablet (8 mg total) by mouth every 8 (eight) hours as needed for nausea. 90 tablet 1  . polyethylene glycol (MIRALAX / GLYCOLAX) packet Take 17 g by mouth daily. 14 each 0  . prochlorperazine (COMPAZINE) 10 MG tablet Take 1 tablet (10 mg total) by mouth every 6 (six) hours as needed (Nausea or vomiting). 90 tablet 1  . rivaroxaban (XARELTO) 20 MG TABS tablet Take 1 tablet (20 mg total) by mouth daily. 30 tablet 0  . senna (SENOKOT) 8.6 MG TABS tablet Take 2 tablets (17.2 mg total) by mouth 2 (two) times daily. (Patient not taking: Reported on 04/13/2017) 120 each 0   No current facility-administered medications for this visit.    Facility-Administered Medications Ordered in Other Visits  Medication Dose Route Frequency Provider Last Rate Last Dose  . CARBOplatin (PARAPLATIN) 750 mg in sodium chloride 0.9 % 250 mL chemo infusion  750 mg Intravenous Once Alvy Bimler, Dreyden Rohrman, MD      . famotidine (PEPCID) IVPB 20 mg premix  20 mg Intravenous Once Alvy Bimler, Randale Carvalho, MD   20 mg at 06/07/17 1044  . fosaprepitant (EMEND) 150 mg, dexamethasone (DECADRON) 12 mg in sodium chloride  0.9 % 145 mL IVPB   Intravenous Once Alvy Bimler, Lynita Groseclose, MD      . heparin lock flush 100 unit/mL  500 Units Intracatheter Once PRN Alvy Bimler, Conway Fedora, MD      . PACLitaxel (TAXOL) 210 mg in sodium chloride 0.9 % 250 mL chemo infusion (> 74m/m2)  87.5 mg/m2 (Treatment Plan Recorded) Intravenous Once Tawyna Pellot, MD      . sodium chloride flush (NS) 0.9 % injection 10 mL  10 mL Intracatheter PRN GAlvy Bimler Adalberto Metzgar, MD        PHYSICAL EXAMINATION: ECOG PERFORMANCE STATUS: 1 - Symptomatic but completely ambulatory  Vitals:   06/07/17 0941  BP: 130/90  Pulse: 75  Resp: 18  Temp: 97.6 F (36.4 C)  SpO2: 100%   Filed Weights   06/07/17 0941  Weight: 258 lb 11.2 oz (117.3 kg)    GENERAL:alert, no distress and comfortable SKIN: skin color, texture, turgor are normal, no rashes or significant  lesions EYES: normal, Conjunctiva are pink and non-injected, sclera clear OROPHARYNX:no exudate, no erythema and lips, buccal mucosa, and tongue normal  NECK: supple, thyroid normal size, non-tender, without nodularity LYMPH:  no palpable lymphadenopathy in the cervical, axillary or inguinal LUNGS: clear to auscultation and percussion with normal breathing effort HEART: regular rate & rhythm and no murmurs and no lower extremity edema ABDOMEN:abdomen soft, non-tender and normal bowel sounds Musculoskeletal:no cyanosis of digits and no clubbing  NEURO: alert & oriented x 3 with fluent speech, no focal motor/sensory deficits  LABORATORY DATA:  I have reviewed the data as listed    Component Value Date/Time   NA 138 06/07/2017 0917   K 3.4 (L) 06/07/2017 0917   CL 99 06/07/2017 0917   CO2 25 06/07/2017 0917   GLUCOSE 193 (H) 06/07/2017 0917   BUN 10 06/07/2017 0917   CREATININE 0.75 06/07/2017 0917   CALCIUM 8.8 06/07/2017 0917   PROT 7.2 06/07/2017 0917   ALBUMIN 3.6 06/07/2017 0917   AST 33 06/07/2017 0917   ALT 49 06/07/2017 0917   ALKPHOS 86 06/07/2017 0917   BILITOT 0.6 06/07/2017 0917   GFRNONAA >60 06/07/2017 0917   GFRAA >60 06/07/2017 0917    No results found for: SPEP, UPEP  Lab Results  Component Value Date   WBC 6.5 06/07/2017   NEUTROABS 5.3 06/07/2017   HGB 11.3 (L) 04/26/2017   HCT 33.7 (L) 06/07/2017   MCV 88.6 06/07/2017   PLT 241 06/07/2017      Chemistry      Component Value Date/Time   NA 138 06/07/2017 0917   K 3.4 (L) 06/07/2017 0917   CL 99 06/07/2017 0917   CO2 25 06/07/2017 0917   BUN 10 06/07/2017 0917   CREATININE 0.75 06/07/2017 0917      Component Value Date/Time   CALCIUM 8.8 06/07/2017 0917   ALKPHOS 86 06/07/2017 0917   AST 33 06/07/2017 0917   ALT 49 06/07/2017 0917   BILITOT 0.6 06/07/2017 0917       RADIOGRAPHIC STUDIES: I have personally reviewed the radiological images as listed and agreed with the findings in the  report. Ct Abdomen Pelvis W Contrast  Result Date: 05/16/2017 CLINICAL DATA:  Followup cervical carcinoma with metastatic disease to sacrum and liver. Undergoing radiation therapy and chemotherapy. Restaging. EXAM: CT ABDOMEN AND PELVIS WITH CONTRAST TECHNIQUE: Multidetector CT imaging of the abdomen and pelvis was performed using the standard protocol following bolus administration of intravenous contrast. CONTRAST:  1036mISOVUE-300 IOPAMIDOL (ISOVUE-300) INJECTION 61% COMPARISON:  03/03/2017 FINDINGS: Lower Chest: No acute findings. Hepatobiliary: Moderate to severe hepatic steatosis again noted. Hepatic metastases again seen in the right and left lobes, which show mild decrease since previous study. Dominant lesion in the right lobe measures 3.9 x 9.7 cm previously. Another index lesion in segment 3 of the left lobe measures 1.9 cm on image 22/2 compared to 2.3 cm previously. No new or enlarging liver metastases identified. Gallbladder is unremarkable. Pancreas:  No mass or inflammatory changes. Spleen: Within normal limits in size and appearance. Adrenals/Urinary Tract: No masses identified. Several tiny sub-cm renal cysts are again noted. A 2 mm nonobstructing calculus in the midpole the right kidney is stable. No evidence of hydronephrosis. Nearly empty urinary bladder. Stomach/Bowel: No evidence of obstruction, inflammatory process or abnormal fluid collections. Normal appendix visualized. Vascular/Lymphatic: No pathologically enlarged lymph nodes. No abdominal aortic aneurysm. Reproductive: Unremarkable appearance of uterus. Soft tissue mass is again seen in the left posterior pelvis, abutting the sacrum. This measures 6.1 x 4.5 cm on image 79/2, compared to 7.0 x 4.9 cm previously. No new or enlarging masses or ascites. Other:  None. Musculoskeletal:  No suspicious bone lesions identified. IMPRESSION: Mild decrease in size of left posterior pelvic soft tissue mass and liver metastases. No new or  progressive metastatic disease identified. Stable hepatic steatosis and tiny nonobstructing right renal calculus. Electronically Signed   By: Earle Gell M.D.   On: 05/16/2017 13:22    All questions were answered. The patient knows to call the clinic with any problems, questions or concerns. No barriers to learning was detected.  I spent 15 minutes counseling the patient face to face. The total time spent in the appointment was 20 minutes and more than 50% was on counseling and review of test results  [name], MD 06/07/2017 10:53 AM

## 2017-06-07 NOTE — Assessment & Plan Note (Signed)
She has profound clinical deconditioning She will continue physical therapy and rehab

## 2017-06-16 ENCOUNTER — Other Ambulatory Visit: Payer: Self-pay

## 2017-06-16 ENCOUNTER — Ambulatory Visit: Payer: 59 | Admitting: Physical Therapy

## 2017-06-16 ENCOUNTER — Emergency Department (HOSPITAL_COMMUNITY): Payer: 59

## 2017-06-16 ENCOUNTER — Emergency Department (HOSPITAL_COMMUNITY)
Admission: EM | Admit: 2017-06-16 | Discharge: 2017-06-16 | Disposition: A | Discharge status: Home / Self Care | Payer: 59 | Attending: Emergency Medicine | Admitting: Emergency Medicine

## 2017-06-16 ENCOUNTER — Encounter: Payer: Self-pay | Admitting: Physical Therapy

## 2017-06-16 ENCOUNTER — Encounter (HOSPITAL_COMMUNITY): Payer: Self-pay | Admitting: Emergency Medicine

## 2017-06-16 DIAGNOSIS — Z79899 Other long term (current) drug therapy: Secondary | ICD-10-CM | POA: Insufficient documentation

## 2017-06-16 DIAGNOSIS — E876 Hypokalemia: Secondary | ICD-10-CM | POA: Diagnosis not present

## 2017-06-16 DIAGNOSIS — E860 Dehydration: Secondary | ICD-10-CM | POA: Diagnosis not present

## 2017-06-16 DIAGNOSIS — Z5111 Encounter for antineoplastic chemotherapy: Secondary | ICD-10-CM | POA: Diagnosis not present

## 2017-06-16 DIAGNOSIS — C787 Secondary malignant neoplasm of liver and intrahepatic bile duct: Secondary | ICD-10-CM | POA: Insufficient documentation

## 2017-06-16 DIAGNOSIS — Z8541 Personal history of malignant neoplasm of cervix uteri: Secondary | ICD-10-CM | POA: Diagnosis not present

## 2017-06-16 DIAGNOSIS — Z87891 Personal history of nicotine dependence: Secondary | ICD-10-CM | POA: Insufficient documentation

## 2017-06-16 DIAGNOSIS — M6281 Muscle weakness (generalized): Secondary | ICD-10-CM | POA: Diagnosis not present

## 2017-06-16 DIAGNOSIS — I10 Essential (primary) hypertension: Secondary | ICD-10-CM | POA: Insufficient documentation

## 2017-06-16 DIAGNOSIS — R000 Tachycardia, unspecified: Secondary | ICD-10-CM | POA: Diagnosis not present

## 2017-06-16 DIAGNOSIS — C763 Malignant neoplasm of pelvis: Secondary | ICD-10-CM | POA: Diagnosis not present

## 2017-06-16 DIAGNOSIS — R109 Unspecified abdominal pain: Secondary | ICD-10-CM | POA: Diagnosis not present

## 2017-06-16 DIAGNOSIS — R0602 Shortness of breath: Secondary | ICD-10-CM | POA: Diagnosis not present

## 2017-06-16 DIAGNOSIS — I4891 Unspecified atrial fibrillation: Secondary | ICD-10-CM | POA: Diagnosis not present

## 2017-06-16 DIAGNOSIS — R2689 Other abnormalities of gait and mobility: Secondary | ICD-10-CM | POA: Diagnosis not present

## 2017-06-16 HISTORY — DX: Malignant (primary) neoplasm, unspecified: C80.1

## 2017-06-16 LAB — CBC WITH DIFFERENTIAL/PLATELET
BASOS ABS: 0 10*3/uL (ref 0.0–0.1)
BASOS PCT: 0 %
EOS ABS: 0 10*3/uL (ref 0.0–0.7)
Eosinophils Relative: 0 %
HEMATOCRIT: 31.8 % — AB (ref 36.0–46.0)
Hemoglobin: 10.8 g/dL — ABNORMAL LOW (ref 12.0–15.0)
Lymphocytes Relative: 48 %
Lymphs Abs: 1.6 10*3/uL (ref 0.7–4.0)
MCH: 29.8 pg (ref 26.0–34.0)
MCHC: 34 g/dL (ref 30.0–36.0)
MCV: 87.8 fL (ref 78.0–100.0)
MONO ABS: 0.4 10*3/uL (ref 0.1–1.0)
Monocytes Relative: 13 %
NEUTROS ABS: 1.3 10*3/uL — AB (ref 1.7–7.7)
Neutrophils Relative %: 39 %
PLATELETS: 199 10*3/uL (ref 150–400)
RBC: 3.62 MIL/uL — ABNORMAL LOW (ref 3.87–5.11)
RDW: 17.1 % — ABNORMAL HIGH (ref 11.5–15.5)
WBC: 3.3 10*3/uL — ABNORMAL LOW (ref 4.0–10.5)

## 2017-06-16 LAB — COMPREHENSIVE METABOLIC PANEL
ALBUMIN: 3.7 g/dL (ref 3.5–5.0)
ALK PHOS: 72 U/L (ref 38–126)
ALT: 73 U/L — AB (ref 14–54)
ANION GAP: 17 — AB (ref 5–15)
AST: 51 U/L — ABNORMAL HIGH (ref 15–41)
BILIRUBIN TOTAL: 0.5 mg/dL (ref 0.3–1.2)
BUN: 18 mg/dL (ref 6–20)
CALCIUM: 8.2 mg/dL — AB (ref 8.9–10.3)
CO2: 30 mmol/L (ref 22–32)
CREATININE: 0.81 mg/dL (ref 0.44–1.00)
Chloride: 90 mmol/L — ABNORMAL LOW (ref 101–111)
GFR calc non Af Amer: >60 mL/min (ref >60)
GLUCOSE: 184 mg/dL — AB (ref 65–99)
Potassium: 2.3 mmol/L — CL (ref 3.5–5.1)
Sodium: 137 mmol/L (ref 135–145)
TOTAL PROTEIN: 7.1 g/dL (ref 6.5–8.1)

## 2017-06-16 LAB — URINALYSIS, ROUTINE W REFLEX MICROSCOPIC
Bilirubin Urine: NEGATIVE
Glucose, UA: NEGATIVE mg/dL
Ketones, ur: NEGATIVE mg/dL
Leukocytes, UA: NEGATIVE
NITRITE: NEGATIVE
PH: 5 (ref 5.0–8.0)
Protein, ur: 30 mg/dL — AB
SPECIFIC GRAVITY, URINE: 1.019 (ref 1.005–1.030)

## 2017-06-16 LAB — TROPONIN I: Troponin I: <0.03 ng/mL (ref <0.03)

## 2017-06-16 MED ORDER — POTASSIUM CHLORIDE CRYS ER 20 MEQ PO TBCR: 20.0000 meq | EXTENDED_RELEASE_TABLET | Freq: Two times a day (BID) | ORAL | 0 refills | Status: DC

## 2017-06-16 MED ORDER — SODIUM CHLORIDE 0.9 % IV BOLUS
1000.0000 mL | Freq: Once | INTRAVENOUS | Status: AC
  Administered 2017-06-16: 1000 mL via INTRAVENOUS

## 2017-06-16 MED ORDER — HEPARIN SOD (PORK) LOCK FLUSH 100 UNIT/ML IV SOLN
500.0000 [IU] | Freq: Once | INTRAVENOUS | Status: AC
  Administered 2017-06-16: 500 [IU]
  Filled 2017-06-16: qty 5

## 2017-06-16 MED ORDER — POTASSIUM CHLORIDE 10 MEQ/100ML IV SOLN
10.0000 meq | INTRAVENOUS | Status: AC
  Administered 2017-06-16 (×2): 10 meq via INTRAVENOUS
  Filled 2017-06-16 (×2): qty 100

## 2017-06-16 MED ORDER — MAGNESIUM SULFATE 2 GM/50ML IV SOLN
2.0000 g | Freq: Once | INTRAVENOUS | Status: AC
  Administered 2017-06-16: 2 g via INTRAVENOUS
  Filled 2017-06-16: qty 50

## 2017-06-16 NOTE — ED Notes (Signed)
CRITICAL VALUE STICKER  CRITICAL VALUE: K+ 2.3  RECEIVER (on-site recipient of call): Jake T RN  DATE & TIME NOTIFIED: 06/16/17 500p  MESSENGER (representative from lab): Nunzio Cory  MD NOTIFIED: Alvino Chapel  TIME OF NOTIFICATION: 500p  RESPONSE: see orders

## 2017-06-16 NOTE — ED Notes (Signed)
Bed: RESA Expected date:  Expected time:  Means of arrival:  Comments: Hold for triage

## 2017-06-16 NOTE — ED Notes (Signed)
Pt provided sandwich and coca cola.

## 2017-06-16 NOTE — ED Triage Notes (Signed)
Per pt, states she recently had her second round of chemo-states since receiving she has been SOB and her HR has been elevated-states she thinks she might be dehydrated

## 2017-06-16 NOTE — ED Notes (Signed)
Bed: WA17 Expected date:  Expected time:  Means of arrival:  Comments: Hold for RES A 

## 2017-06-16 NOTE — Therapy (Addendum)
[place], [place], [address] Phone: [phone]   Fax:  [phone]  Physical Therapy Treatment  Patient Details  Name: Cindy Faulkner MRN: [id number] Date of Birth: 1965-12-07 Referring Provider: Dr. Alvy Bimler    Encounter Date: 06/16/2017  PT End of Session - 06/16/17 1451    Visit Number  6    Number of Visits  18    Date for PT Re-Evaluation  07/11/17    PT Start Time  1432    PT Stop Time  1450    PT Time Calculation (min)  18 min    Activity Tolerance  Patient tolerated treatment well    Behavior During Therapy  [facility] for tasks assessed/performed       Past Medical History:  Diagnosis Date  . Atrial fibrillation ([place])    a. echo 4/07: EF 60%  . Atrial tachycardia ([place])    a. s/p EPS 4/09: no inducible SVT - med Tx continued  . Complication of anesthesia    age 34yo, woke during procedure with GA, paralysed   . Endometrial polyp   . Hyperlipidemia   . Hypertension   . PONV (postoperative nausea and vomiting)   . Rectocele    WITHOUT MENTION OF UTERINE PROLAPSE  . Stroke ([place])   . SVD (spontaneous vaginal delivery)    x 1    Past Surgical History:  Procedure Laterality Date  . COLONOSCOPY    . HYSTEROSCOPY    . IR FLUORO GUIDE PORT INSERTION RIGHT  03/23/2017  . IR US GUIDE VASC ACCESS RIGHT  03/23/2017  . LEEP    . Pylonidal cystect    . TUBAL LIGATION    . WISDOM TOOTH EXTRACTION      There were no vitals filed for this visit.  Subjective Assessment - 06/16/17 1440    Subjective  Pt reports she is blacking out after she stands up or walks. Reports shortness of breath just walking back to the treatment room.     Pertinent History  [place]winge of back pain one day led to increased pain by later that day; saw chiropractor for one month; saw orthopedist and got injection, which helped x 1 day.  Had MRI and that found the tumor in December and was sent to neurosurgeon.  Neurosurgeon  couldn't do surgery; pt. was sent to oncologist.  Liver lesions were found and one is large, so can't do surgery. Has had one chemo and will have this q 3 weeks x 6. h/o a-fib and a stroke in the past with little or no residual. Is on medication for a-fib with good control.     Patient Stated Goals  do what she can to stay strong, and stay ambulatory    Currently in Pain?  No/denies    Pain Score  0-No pain                               PT Education - 06/16/17 1454    Education provided  Yes    Education Details  educated pt that it is not safe to exercise with her heart rate in the upper 120s and lower 130s at rest, educated pt to seek medical care since she has been blacking out recently    [employer]) Educated  Patient;Parent(s)    Methods  Explanation    Comprehension  Verbalized understanding  PT Long Term Goals - 05/11/17 1048      PT LONG TERM GOAL #1   Title  Patient will be independent with a home exercise program for left LE strengthening.    Time  2    Period  Weeks    Status  On-going      PT LONG TERM GOAL #2   Title  Pt will decrease TUG score to < 8 seconds indicating an improvement in functional mobility     Time  8    Period  Weeks    Status  New      PT LONG TERM GOAL #3   Title  Pt will report she has 50% less fatigue at home so that she is able to be more involved in family activities.     Time  8    Period  Weeks    Status  New            Plan - 06/16/17 1451    Clinical Impression Statement  Pt feeling very fatigued today when she walked in. She reports all week she has been blacking out when she stands. She states yesterday she blacked out and fell in the floor and was unable to get up for a while due to fatigue. Pt's BP was 130/98 in sitting. Her heart rate was 132 bpm prior to any exercise. Continued to montior pt's heart rate in sitting for 5 min and it was still in the upper 120s. Her O2 was 99%. Pt was educated  to go see her doctor due to her recent black outs and high heart rate in sitting. Suspect pt may be dehydrated. Her mother was going to drive her to the doctor as soon as they left here.     Rehab Potential  Good    Clinical Impairments Affecting Rehab Potential  none    PT Frequency  2x / week    PT Duration  8 weeks    PT Treatment/Interventions  ADLs/Self Care Home Management;Therapeutic exercise;Patient/family education    PT Next Visit Plan  see how pt is feeling today- check HR, continue general conditioning/strengthening    PT Home Exercise Plan  ankle 4 way, ankle ABC, supine scap, hip abd in supine with yellow band    Consulted and Agree with Plan of Care  Patient       Patient will benefit from skilled therapeutic intervention in order to improve the following deficits and impairments:  Abnormal gait, Decreased strength, Pain  Visit Diagnosis: Muscle weakness (generalized)     Problem List Patient Active Problem List   Diagnosis Date Noted  . 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([place]) 04/26/2017  . Physical deconditioning 04/26/2017  . Drug-induced skin rash 04/12/2017  . Night sweats 04/12/2017  . Port-A-Cath in place 04/04/2017  . Neuropathy associated with cancer ([place]) 04/04/2017  . Goals of care, counseling/discussion 04/04/2017  . Metastatic squamous cell carcinoma involving liver & sacrum with unknown primary site  03/30/2017  . Metastasis to liver of unknown origin ([place]) 03/18/2017  . Cancer of unknown origin ([place]) 03/11/2017  . Metastasis to liver ([place]) 03/03/2017  . Sacral mass 03/02/2017  . Cancer associated pain 03/02/2017  . Mild single current episode of major depressive disorder ([place]) 08/23/2016  . Cerebral thrombosis with cerebral infarction 12/05/2015  . Stroke (cerebrum) ([place]) 12/05/2015  . Obstructive sleep apnea 07/24/2015  . Preoperative evaluation to rule out surgical contraindication 10/04/2011  . Chest pain, unspecified 06/24/2010  . Shortness of breath  06/24/2010  . Essential hypertension 10/20/2008  . RECTOCELE WITHOUT MENTION OF UTERINE PROLAPSE 10/20/2008  . ENDOMETRIAL POLYP 10/20/2008  . AF (paroxysmal atrial fibrillation) ([place]) 10/20/2008    Allyson Sabal [facility] 06/16/2017, 2:55 PM  [place], [place], [address] Phone: [phone]   Fax:  [phone]  Name: TANAYSIA BHARDWAJ MRN: [id number] Date of Birth: 1965/09/11  Manus Gunning, PT 06/16/17 2:55 PM  PHYSICAL THERAPY DISCHARGE SUMMARY  Visits from Start of Care: 6  Current functional level related to goals / functional outcomes: See above   Remaining deficits: See above   Education / Equipment: See above/ HEP  Plan: Patient agrees to discharge.  Patient goals were not met. Patient is being discharged due to not returning since the last visit.  ?????    Allyson Sabal [place], [place] 12/27/17 2:49 PM

## 2017-06-16 NOTE — ED Provider Notes (Addendum)
[facility] DEPT Provider Note   CSN: [id number] Arrival date & time: 06/16/17  1520     History   Chief Complaint Chief Complaint  Patient presents with  . Shortness of Breath  . elevated HR    HPI Cindy Faulkner is a 52 y.o. female.  HPI Patient presents with shortness of breath and fatigue.  Decreased energy.  Has a history of likely cervical cancer metastatic to liver.  A week ago finished up her fourth cycle of chemotherapy.  States this 1 has been a little rough.  Decreased oral intake.  Increased fatigue.  Occasional cough.  Had one episode of vomiting.  Decreased energy.  No real abdominal pain.  No fevers or chills.  No dysuria.  No swelling in her legs.  States her heart rate was 124 her physical therapist.  States she feels as if she could be dehydrated. Past Medical History:  Diagnosis Date  . Atrial fibrillation ([place])    a. echo 4/07: EF 60%  . Atrial tachycardia ([place])    a. s/p EPS 4/09: no inducible SVT - med Tx continued  . Cancer ([place])   . Complication of anesthesia    age 41yo, woke during procedure with GA, paralysed   . Endometrial polyp   . Hyperlipidemia   . Hypertension   . PONV (postoperative nausea and vomiting)   . Rectocele    WITHOUT MENTION OF UTERINE PROLAPSE  . Stroke ([place])   . SVD (spontaneous vaginal delivery)    x 1    Patient Active Problem List   Diagnosis Date Noted  . 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([place]) 04/26/2017  . Physical deconditioning 04/26/2017  . Drug-induced skin rash 04/12/2017  . Night sweats 04/12/2017  . Port-A-Cath in place 04/04/2017  . Neuropathy associated with cancer ([place]) 04/04/2017  . Goals of care, counseling/discussion 04/04/2017  . Metastatic squamous cell carcinoma involving liver & sacrum with unknown primary site  03/30/2017  . Metastasis to liver of unknown origin ([place]) 03/18/2017  . Cancer of unknown origin ([place]) 03/11/2017  . Metastasis to liver ([place]) 03/03/2017  . Sacral  mass 03/02/2017  . Cancer associated pain 03/02/2017  . Mild single current episode of major depressive disorder ([place]) 08/23/2016  . Cerebral thrombosis with cerebral infarction 12/05/2015  . Stroke (cerebrum) ([place]) 12/05/2015  . Obstructive sleep apnea 07/24/2015  . Preoperative evaluation to rule out surgical contraindication 10/04/2011  . Chest pain, unspecified 06/24/2010  . Shortness of breath 06/24/2010  . Essential hypertension 10/20/2008  . RECTOCELE WITHOUT MENTION OF UTERINE PROLAPSE 10/20/2008  . ENDOMETRIAL POLYP 10/20/2008  . AF (paroxysmal atrial fibrillation) ([place]) 10/20/2008    Past Surgical History:  Procedure Laterality Date  . COLONOSCOPY    . HYSTEROSCOPY    . IR FLUORO GUIDE PORT INSERTION RIGHT  03/23/2017  . IR US GUIDE VASC ACCESS RIGHT  03/23/2017  . LEEP    . Pylonidal cystect    . TUBAL LIGATION    . WISDOM TOOTH EXTRACTION       OB History   None      Home Medications    Prior to Admission medications   Medication Sig Start Date End Date Taking? Authorizing Provider  ALPRAZolam (XANAX) 0.25 MG tablet Take 1 tablet (0.25 mg total) by mouth 3 (three) times daily as needed for sleep. 04/04/17  Yes [name], MD  atorvastatin (LIPITOR) 40 MG tablet Take 1 tablet (40 mg total) by mouth daily at 6 PM. 12/06/15  Yes  Dhungel, Nishant, MD  cholecalciferol (VITAMIN D) 1000 units tablet Take 2,000 Units by mouth daily.   Yes [provider]  citalopram (CELEXA) 20 MG tablet Take 20 mg by mouth daily.   Yes [provider]  clonazePAM (KLONOPIN) 0.5 MG tablet Take 0.5 mg by mouth daily. 06/06/17  Yes [provider]  dexamethasone (DECADRON) 4 MG tablet Take 5 tabs at night with food and 5 tabs at 6 am the morning of chemotherapy, every 3 weeks 04/04/17  Yes Alvy Bimler, Ni, MD  flecainide (TAMBOCOR) 50 MG tablet Take 1 tablet (50 mg total) by mouth daily. 09/14/16  Yes Evans Lance, MD  furosemide (LASIX) 40 MG tablet Take 40 mg by  mouth daily.   Yes [provider]  gabapentin (NEURONTIN) 300 MG capsule Take 300-600 mg by mouth 2 (two) times daily. 300 mg in the am and 600 mg at night   Yes [provider]  Glucosamine-Chondroitin (GLUCOSAMINE CHONDR COMPLEX PO) Take 2 capsules by mouth daily.    Yes [provider]  HYDROmorphone (DILAUDID) 2 MG tablet Take 1-2 tablets (2-4 mg total) by mouth every 3 (three) hours as needed for moderate pain or severe pain. 04/01/17  Yes Shelly Coss, MD  HYDROmorphone (DILAUDID) 4 MG tablet Take 4 mg by mouth every 4 (four) hours. 04/12/17  Yes [provider]  lidocaine-prilocaine (EMLA) cream Apply 1 application topically as needed. 04/01/17  Yes Gorsuch, Ni, MD  metFORMIN (GLUCOPHAGE) 500 MG tablet Take 250 mg by mouth daily. 05/25/17  Yes [provider]  metoprolol tartrate (LOPRESSOR) 50 MG tablet Take 1 tablet (50 mg total) by mouth daily. 09/14/16  Yes Evans Lance, MD  Omega-3 Fatty Acids (FISH OIL) 1000 MG CAPS Take 2 capsules by mouth daily.     Yes [provider]  ondansetron (ZOFRAN) 8 MG tablet Take 1 tablet (8 mg total) by mouth every 8 (eight) hours as needed for nausea. 04/26/17  Yes Gorsuch, Ni, MD  polyethylene glycol (MIRALAX / GLYCOLAX) packet Take 17 g by mouth daily. 04/01/17  Yes Shelly Coss, MD  prochlorperazine (COMPAZINE) 10 MG tablet Take 1 tablet (10 mg total) by mouth every 6 (six) hours as needed (Nausea or vomiting). 05/17/17  Yes Gorsuch, Ernst Spell, MD  rivaroxaban (XARELTO) 20 MG TABS tablet Take 1 tablet (20 mg total) by mouth daily. 12/06/15  Yes Dhungel, Nishant, MD  gabapentin (NEURONTIN) 400 MG capsule Take 1 capsule (400 mg total) by mouth 3 (three) times daily. Patient not taking: Reported on 06/16/2017 04/01/17 05/01/17  Shelly Coss, MD  potassium chloride SA (K-DUR,KLOR-CON) 20 MEQ tablet Take 1 tablet (20 mEq total) by mouth 2 (two) times daily. 06/16/17   Davonna Belling, MD  senna (SENOKOT) 8.6 MG  TABS tablet Take 2 tablets (17.2 mg total) by mouth 2 (two) times daily. Patient not taking: Reported on 04/13/2017 04/01/17   Shelly Coss, MD    Family History Family History  Problem Relation Age of Onset  . Hypertension Father   . Diabetes Father   . Stroke Father   . Hypertension Mother   . Stroke Mother   . Cancer Mother        cervical ca  . Cancer Sister 8       uterine ca  . Cancer Maternal Grandmother        lung ca    Social History Social History   Tobacco Use  . Smoking status: Former Smoker    Packs/day: 0.50  Years: 10.00    Pack years: 5.00    Types: Cigarettes    Last attempt to quit: 02/22/1998    Years since quitting: 19.3  . Smokeless tobacco: Never Used  Substance Use Topics  . Alcohol use: No  . Drug use: No     Allergies   Patient has no known allergies.   Review of Systems Review of Systems  Constitutional: Positive for appetite change and fatigue. Negative for fever.  HENT: Negative for congestion.   Respiratory: Positive for shortness of breath.   Cardiovascular: Negative for chest pain.  Gastrointestinal: Positive for abdominal pain.  Endocrine: Negative for polyuria.  Genitourinary: Negative for flank pain.  Musculoskeletal: Negative for back pain.  Skin: Negative for rash.  Neurological: Positive for light-headedness.  Hematological: Negative for adenopathy.  Psychiatric/Behavioral: Negative for confusion.     Physical Exam Updated Vital Signs BP 126/68   Pulse 84   Temp 98.2 F (36.8 C) (Oral)   Resp 11   LMP 02/12/2012   SpO2 97%   Physical Exam  Constitutional: She appears well-developed.  HENT:  Head: Atraumatic.  Eyes: Pupils are equal, round, and reactive to light.  Neck: Neck supple.  Cardiovascular:  Initial mild tachycardia.  Pulmonary/Chest: Breath sounds normal.  Port-A-Cath to right chest.  Abdominal: There is no tenderness.  Musculoskeletal:       Right lower leg: She exhibits no tenderness.        Left lower leg: She exhibits no tenderness.  Neurological: She is alert.  Skin: Skin is warm. Capillary refill takes less than 2 seconds.     ED Treatments / Results  Labs (all labs ordered are listed, but only abnormal results are displayed) Labs Reviewed  COMPREHENSIVE METABOLIC PANEL - Abnormal; Notable for the following components:      Result Value   Potassium 2.3 (*)    Chloride 90 (*)    Glucose, Bld 184 (*)    Calcium 8.2 (*)    AST 51 (*)    ALT 73 (*)    Anion gap 17 (*)    All other components within normal limits  CBC WITH DIFFERENTIAL/PLATELET - Abnormal; Notable for the following components:   WBC 3.3 (*)    RBC 3.62 (*)    Hemoglobin 10.8 (*)    HCT 31.8 (*)    RDW 17.1 (*)    Neutro Abs 1.3 (*)    All other components within normal limits  URINALYSIS, ROUTINE W REFLEX MICROSCOPIC - Abnormal; Notable for the following components:   APPearance HAZY (*)    Hgb urine dipstick SMALL (*)    Protein, ur 30 (*)    Bacteria, UA RARE (*)    All other components within normal limits  TROPONIN I    EKG None  Radiology Dg Chest 2 View  Result Date: 06/16/2017 CLINICAL DATA:  Recent chemotherapy for pelvic cancer. Shortness of breath and tachycardia. EXAM: CHEST - 2 VIEW COMPARISON:  02/28/2017 FINDINGS: Artifact overlies the chest. Heart size is normal. Power port inserted from a right jugular approach has its tip in the SVC above the right atrium. Chronic scarring redemonstrated in the right infrahilar region. This appear slightly more prominent in there could be an element of active atelectasis. IMPRESSION: Previously seen scarring in the right infrahilar region and lower lung, similar or slightly more pronounced compared to the previous study. There could be some active atelectasis. Electronically Signed   By: Nelson Chimes M.D.   On: 06/16/2017  17:53    Procedures Procedures (including critical care time)  Medications Ordered in ED Medications  sodium  chloride 0.9 % bolus 1,000 mL (0 mLs Intravenous Stopped 06/16/17 1721)  potassium chloride 10 mEq in 100 mL IVPB (0 mEq Intravenous Stopped 06/16/17 2254)  magnesium sulfate IVPB 2 g 50 mL (0 g Intravenous Stopped 06/16/17 2150)  sodium chloride 0.9 % bolus 1,000 mL (0 mLs Intravenous Stopped 06/16/17 2227)  heparin lock flush 100 unit/mL (500 Units Intracatheter Given 06/16/17 2348)     Initial Impression / Assessment and Plan / ED Course  I have reviewed the triage vital signs and the nursing notes.  Pertinent labs & imaging results that were available during my care of the patient were reviewed by me and considered in my medical decision making (see chart for details).     Patient with dehydration and hypokalemia.  Presumed hypomagnesemia.  Recent chemotherapy.  Feels better after treatment.  Tolerate orals will discharge home.  No sign of infection.  Final Clinical Impressions(s) / ED Diagnoses   Final diagnoses:  Dehydration  Hypokalemia    ED Discharge Orders        Ordered    potassium chloride SA (K-DUR,KLOR-CON) 20 MEQ tablet  2 times daily     06/16/17 2321       Davonna Belling, MD 06/17/17 Larena Glassman    Davonna Belling, MD 06/17/17 0025

## 2017-06-16 NOTE — ED Notes (Signed)
Pt reminded of UA. Unable to urinate at this time.

## 2017-06-16 NOTE — Discharge Instructions (Signed)
Try to keep yourself hydrated.

## 2017-06-17 ENCOUNTER — Encounter: Payer: 59 | Admitting: Physical Therapy

## 2017-06-20 ENCOUNTER — Telehealth: Payer: Self-pay | Admitting: *Deleted

## 2017-06-20 ENCOUNTER — Other Ambulatory Visit: Payer: Self-pay | Admitting: Hematology and Oncology

## 2017-06-20 DIAGNOSIS — E876 Hypokalemia: Secondary | ICD-10-CM | POA: Insufficient documentation

## 2017-06-20 NOTE — Telephone Encounter (Signed)
Notified of message below. Verbalized understanding 

## 2017-06-20 NOTE — Telephone Encounter (Signed)
-----   Message from [name], MD sent at 06/20/2017  6:44 AM EDT ----- Regarding: ER visit Her potassium was very low Recommend stopping Lasix and potassium rich diet

## 2017-06-27 ENCOUNTER — Other Ambulatory Visit: Payer: Self-pay | Admitting: Hematology and Oncology

## 2017-06-27 MED FILL — ONDANSETRON HCL 8 MG TABLET: 8 | 30 days supply | Qty: 90 | Fill #1

## 2017-06-27 MED FILL — CITALOPRAM HBR 20 MG TABLET: 20 | 90 days supply | Qty: 90 | Fill #0

## 2017-06-27 MED FILL — PROCHLORPERAZINE 10 MG TAB: 10 | 22 days supply | Qty: 90 | Fill #1

## 2017-06-28 ENCOUNTER — Other Ambulatory Visit: Payer: Self-pay | Admitting: Hematology and Oncology

## 2017-06-28 ENCOUNTER — Inpatient Hospital Stay: Payer: 59

## 2017-06-28 ENCOUNTER — Inpatient Hospital Stay: Payer: 59 | Attending: Hematology and Oncology

## 2017-06-28 ENCOUNTER — Encounter: Payer: Self-pay | Admitting: Hematology and Oncology

## 2017-06-28 ENCOUNTER — Inpatient Hospital Stay (HOSPITAL_BASED_OUTPATIENT_CLINIC_OR_DEPARTMENT_OTHER): Payer: 59 | Admitting: Hematology and Oncology

## 2017-06-28 DIAGNOSIS — G893 Neoplasm related pain (acute) (chronic): Secondary | ICD-10-CM | POA: Insufficient documentation

## 2017-06-28 DIAGNOSIS — R55 Syncope and collapse: Secondary | ICD-10-CM

## 2017-06-28 DIAGNOSIS — Z7984 Long term (current) use of oral hypoglycemic drugs: Secondary | ICD-10-CM | POA: Diagnosis not present

## 2017-06-28 DIAGNOSIS — Z7901 Long term (current) use of anticoagulants: Secondary | ICD-10-CM

## 2017-06-28 DIAGNOSIS — C801 Malignant (primary) neoplasm, unspecified: Secondary | ICD-10-CM

## 2017-06-28 DIAGNOSIS — G63 Polyneuropathy in diseases classified elsewhere: Secondary | ICD-10-CM

## 2017-06-28 DIAGNOSIS — E876 Hypokalemia: Secondary | ICD-10-CM | POA: Diagnosis not present

## 2017-06-28 DIAGNOSIS — E099 Drug or chemical induced diabetes mellitus without complications: Secondary | ICD-10-CM

## 2017-06-28 DIAGNOSIS — G620 Drug-induced polyneuropathy: Secondary | ICD-10-CM | POA: Insufficient documentation

## 2017-06-28 DIAGNOSIS — Z923 Personal history of irradiation: Secondary | ICD-10-CM | POA: Insufficient documentation

## 2017-06-28 DIAGNOSIS — C787 Secondary malignant neoplasm of liver and intrahepatic bile duct: Secondary | ICD-10-CM

## 2017-06-28 DIAGNOSIS — R531 Weakness: Secondary | ICD-10-CM | POA: Diagnosis not present

## 2017-06-28 DIAGNOSIS — Z5111 Encounter for antineoplastic chemotherapy: Secondary | ICD-10-CM | POA: Insufficient documentation

## 2017-06-28 DIAGNOSIS — Z95828 Presence of other vascular implants and grafts: Secondary | ICD-10-CM

## 2017-06-28 DIAGNOSIS — D6481 Anemia due to antineoplastic chemotherapy: Secondary | ICD-10-CM | POA: Diagnosis not present

## 2017-06-28 DIAGNOSIS — Z79899 Other long term (current) drug therapy: Secondary | ICD-10-CM | POA: Insufficient documentation

## 2017-06-28 LAB — CBC WITH DIFFERENTIAL (CANCER CENTER ONLY)
BASOS ABS: 0 10*3/uL (ref 0.0–0.1)
Basophils Relative: 0 %
EOS PCT: 0 %
Eosinophils Absolute: 0 10*3/uL (ref 0.0–0.5)
HCT: 31.1 % — ABNORMAL LOW (ref 34.8–46.6)
HEMOGLOBIN: 10.4 g/dL — AB (ref 11.6–15.9)
LYMPHS ABS: 1 10*3/uL (ref 0.9–3.3)
LYMPHS PCT: 19 %
MCH: 30.7 pg (ref 25.1–34.0)
MCHC: 33.4 g/dL (ref 31.5–36.0)
MCV: 91.7 fL (ref 79.5–101.0)
MONOS PCT: 2 %
Monocytes Absolute: 0.1 10*3/uL (ref 0.1–0.9)
NEUTROS ABS: 4 10*3/uL (ref 1.5–6.5)
NEUTROS PCT: 79 %
PLATELETS: 222 10*3/uL (ref 145–400)
RBC: 3.39 MIL/uL — ABNORMAL LOW (ref 3.70–5.45)
RDW: 18.8 % — ABNORMAL HIGH (ref 11.2–14.5)
WBC Count: 5.1 10*3/uL (ref 3.9–10.3)

## 2017-06-28 LAB — CMP (CANCER CENTER ONLY)
ALT: 55 U/L (ref 0–55)
ANION GAP: 14 — AB (ref 3–11)
AST: 43 U/L — ABNORMAL HIGH (ref 5–34)
Albumin: 3.7 g/dL (ref 3.5–5.0)
Alkaline Phosphatase: 75 U/L (ref 40–150)
BUN: 11 mg/dL (ref 7–26)
CHLORIDE: 95 mmol/L — AB (ref 98–109)
CO2: 29 mmol/L (ref 22–29)
Calcium: 7.2 mg/dL — ABNORMAL LOW (ref 8.4–10.4)
Creatinine: 0.81 mg/dL (ref 0.60–1.10)
GFR, Estimated: >60 mL/min (ref >60)
Glucose, Bld: 211 mg/dL — ABNORMAL HIGH (ref 70–140)
POTASSIUM: 3.1 mmol/L — AB (ref 3.5–5.1)
SODIUM: 138 mmol/L (ref 136–145)
Total Bilirubin: 0.6 mg/dL (ref 0.2–1.2)
Total Protein: 7.2 g/dL (ref 6.4–8.3)

## 2017-06-28 LAB — MAGNESIUM: MAGNESIUM: 0.7 mg/dL — AB (ref 1.7–2.4)

## 2017-06-28 MED ORDER — SODIUM CHLORIDE 0.9% FLUSH
10.0000 mL | INTRAVENOUS | Status: DC | PRN
  Filled 2017-06-28: qty 10

## 2017-06-28 MED ORDER — MAGNESIUM OXIDE 400 (241.3 MG) MG PO TABS: 400.0000 mg | ORAL_TABLET | Freq: Two times a day (BID) | ORAL | 3 refills | Status: DC

## 2017-06-28 MED ORDER — FAMOTIDINE IN NACL 20-0.9 MG/50ML-% IV SOLN
20.0000 mg | Freq: Once | INTRAVENOUS | Status: AC
  Administered 2017-06-28: 20 mg via INTRAVENOUS

## 2017-06-28 MED ORDER — PALONOSETRON HCL INJECTION 0.25 MG/5ML
0.2500 mg | Freq: Once | INTRAVENOUS | Status: AC
  Administered 2017-06-28: 0.25 mg via INTRAVENOUS

## 2017-06-28 MED ORDER — PALONOSETRON HCL INJECTION 0.25 MG/5ML
INTRAVENOUS | Status: AC
  Filled 2017-06-28: qty 5

## 2017-06-28 MED ORDER — HEPARIN SOD (PORK) LOCK FLUSH 100 UNIT/ML IV SOLN
500.0000 [IU] | Freq: Once | INTRAVENOUS | Status: DC | PRN
  Filled 2017-06-28: qty 5

## 2017-06-28 MED ORDER — FAMOTIDINE IN NACL 20-0.9 MG/50ML-% IV SOLN
INTRAVENOUS | Status: AC
  Filled 2017-06-28: qty 50

## 2017-06-28 MED ORDER — DIPHENHYDRAMINE HCL 50 MG/ML IJ SOLN
INTRAMUSCULAR | Status: AC
  Filled 2017-06-28: qty 1

## 2017-06-28 MED ORDER — SODIUM CHLORIDE 0.9 % IV SOLN
Freq: Once | INTRAVENOUS | Status: AC
  Administered 2017-06-28: 11:00:00 via INTRAVENOUS
  Filled 2017-06-28: qty 5

## 2017-06-28 MED ORDER — SODIUM CHLORIDE 0.9% FLUSH
10.0000 mL | Freq: Once | INTRAVENOUS | Status: AC
  Administered 2017-06-28: 10 mL
  Filled 2017-06-28: qty 10

## 2017-06-28 MED ORDER — ALPRAZOLAM 0.25 MG PO TABS: 0.2500 mg | ORAL_TABLET | Freq: Three times a day (TID) | ORAL | 0 refills | Status: DC | PRN

## 2017-06-28 MED ORDER — DIPHENHYDRAMINE HCL 50 MG/ML IJ SOLN
12.5000 mg | Freq: Once | INTRAMUSCULAR | Status: AC
  Administered 2017-06-28: 12.5 mg via INTRAVENOUS

## 2017-06-28 MED ORDER — SODIUM CHLORIDE 0.9 % IV SOLN
87.5000 mg/m2 | Freq: Once | INTRAVENOUS | Status: AC
  Administered 2017-06-28: 210 mg via INTRAVENOUS
  Filled 2017-06-28: qty 35

## 2017-06-28 MED ORDER — SODIUM CHLORIDE 0.9 % IV SOLN
Freq: Once | INTRAVENOUS | Status: AC
  Administered 2017-06-28: 10:00:00 via INTRAVENOUS

## 2017-06-28 MED ORDER — SODIUM CHLORIDE 0.9 % IV SOLN
750.0000 mg | Freq: Once | INTRAVENOUS | Status: AC
  Administered 2017-06-28: 750 mg via INTRAVENOUS
  Filled 2017-06-28: qty 75

## 2017-06-28 MED ORDER — HYDROMORPHONE HCL 4 MG PO TABS: 4.0000 mg | ORAL_TABLET | Freq: Four times a day (QID) | ORAL | 0 refills | Status: DC | PRN

## 2017-06-28 MED ORDER — SODIUM CHLORIDE 0.9 % IV SOLN
2.0000 g | Freq: Once | INTRAVENOUS | Status: AC
  Administered 2017-06-28: 2 g via INTRAVENOUS
  Filled 2017-06-28: qty 4

## 2017-06-28 MED FILL — HYDROmorphone HCL 4 MG TABS: 4 | 15 days supply | Qty: 60 | Fill #0

## 2017-06-28 MED FILL — ALPRAZolam 0.25 MG TABS: 0.25 | 20 days supply | Qty: 60 | Fill #0

## 2017-06-28 NOTE — Assessment & Plan Note (Signed)
She had recent syncopal episode, could be triggered by electrolyte imbalance I recommend close follow-up with cardiologist

## 2017-06-28 NOTE — Patient Instructions (Signed)

## 2017-06-28 NOTE — Assessment & Plan Note (Signed)
She has severe neuropathy affecting the left foot Plan to continue on reduced dose of Taxol She will continue gabapentin and pain medicine as needed She will also continue cancer rehab

## 2017-06-28 NOTE — Assessment & Plan Note (Signed)
She has persistent cancer associated pain Her current prescription pain medicine is helpful She also have occasional bone pain after chemotherapy that is helped by hydromorphone I refilled her prescription today and warned about potential risk of nausea and constipation

## 2017-06-28 NOTE — Assessment & Plan Note (Addendum)
She tolerated treatment well except for some persistent weakness and neuropathy affecting the left foot We will continue similar dose with dose adjustment Due to her indeterminate decision about Avastin, I recommend we proceed to complete 6 cycles of chemotherapy and repeat staging scan before we decide whether she wants to go on maintenance treatment with Avastin

## 2017-06-28 NOTE — Progress Notes (Signed)
[facility] OFF[facility] PROGRESS NOTE  Patient Care Team: Lennie Odor, PA-C as PCP - General (Nurse Practitioner) Arvella Nigh, MD as Obstetrician (Obstetrics and Gynecology) Evans Lance, MD as Consulting Physician (Cardiology)  ASSESSMENT & PLAN:  Cancer of unknown origin [facility]) She tolerated treatment well except for some persistent weakness and neuropathy affecting the left foot We will continue similar dose with dose adjustment Due to her indeterminate decision about Avastin, I recommend we proceed to complete 6 cycles of chemotherapy and repeat staging scan before we decide whether she wants to go on maintenance treatment with Avastin  Metastasis to liver [facility]) The liver masses are improving in size Her liver enzymes had normalized We will continue to monitor her liver function carefully  Neuropathy associated with cancer [facility]) She has severe neuropathy affecting the left foot Plan to continue on reduced dose of Taxol She will continue gabapentin and pain medicine as needed She will also continue cancer rehab  Hypokalemia She had recurrent severe hypokalemia Furosemide was discontinued She had received potassium replacement therapy I recommend potassium rich diet  Hypomagnesemia She has symptoms of severe hypomagnesemia She will receive intravenous magnesium replacement, oral magnesium replacement therapy and we will continue to monitor her magnesium level carefully  Syncopal episodes She had recent syncopal episode, could be triggered by electrolyte imbalance I recommend close follow-up with cardiologist  Cancer associated pain She has persistent cancer associated pain Her current prescription pain medicine is helpful She also have occasional bone pain after chemotherapy that is helped by hydromorphone I refilled her prescription today and warned about potential risk of nausea and constipation   No orders of the defined types were placed in this  encounter.   INTERVAL HISTORY: Please see below for problem oriented charting. She returns with her daughter for further follow-up and cycle 5 of chemotherapy She had recent syncopal episode and significant muscle cramps She was recently seen in the emergency department and was noted to have severe hypokalemia Her cancer pain in the sacrum area continues to bother her She continues to have severe neuropathy in both feet especially over the left side She also have intermittent bone pain after chemotherapy The reduced dose chemotherapy appears to have helped She denies nausea or vomiting Denies recurrent of constipation She denies recent infection, fever or chills The patient denies any recent signs or symptoms of bleeding such as spontaneous epistaxis, hematuria or hematochezia.  SUMMARY OF ONCOLOGIC HISTORY: Oncology History   Biothernostics: 96% probability cervical cancer  Additional evaluation revealed PTEN deletion, negative for MET amplification, PD-L1 expression of 60     Cancer of unknown origin ([place])   03/03/2017 Imaging    1. Multifocal liver metastasis. 2. Left posterior pelvic mass extending into the obturator foramen measures up to 7 cm. Suspicious for malignancy. This should be easily amendable to percutaneous tissue sampling under image guidance. 3. No lytic bone lesions identified of myeloma.      03/09/2017 Procedure    Technically successful ultrasound-guided core liver lesion biopsy      03/09/2017 Pathology Results    A. LIVER LESION, RIGHT LOBE; ULTRASOUND-GUIDED BIOPSY:  - METASTATIC SQUAMOUS CELL CARCINOMA, SEE COMMENT.   Comment: Metastatic carcinoma is immunoreactive for p40, p16, p53 and pancytokeratin while CK7, CK20, and TTF1 are negative. Significance of p16 positivity depends on the site of origin and the pattern of immunoreactivity in this material is non-specific. Possible sites of origin include gynecologic, anal, lung, and head/neck. Correlation  with physical exam and  additional imaging is required.       03/18/2017 Pathology Results    She had GYN exam and pap smear. Result is negative for malignancy      03/21/2017 - 04/01/2017 Hospital Admission    She was admitted to the hospital for pain management and had radiation therapy      03/23/2017 Procedure    Ultrasound and fluoroscopically guided right internal jugular single lumen power port catheter insertion. Tip in the SVC/RA junction. Catheter ready for use.      05/16/2017 Imaging    Mild decrease in size of left posterior pelvic soft tissue mass and liver metastases. No new or progressive metastatic disease identified.  Stable hepatic steatosis and tiny nonobstructing right renal calculus.       REVIEW OF SYSTEMS:   Constitutional: Denies fevers, chills or abnormal weight loss Eyes: Denies blurriness of vision Ears, nose, mouth, throat, and face: Denies mucositis or sore throat Respiratory: Denies cough, dyspnea or wheezes Cardiovascular: Denies palpitation, chest discomfort or lower extremity swelling Gastrointestinal:  Denies nausea, heartburn or change in bowel habits Skin: Denies abnormal skin rashes Lymphatics: Denies new lymphadenopathy or easy bruising Behavioral/Psych: Mood is stable, no new changes  All other systems were reviewed with the patient and are negative.  I have reviewed the past medical history, past surgical history, social history and family history with the patient and they are unchanged from previous note.  ALLERGIES:  has No Known Allergies.  MEDICATIONS:  Current Outpatient Medications  Medication Sig Dispense Refill  . ALPRAZolam (XANAX) 0.25 MG tablet Take 1 tablet (0.25 mg total) by mouth 3 (three) times daily as needed for sleep. 60 tablet 0  . atorvastatin (LIPITOR) 40 MG tablet Take 1 tablet (40 mg total) by mouth daily at 6 PM. 30 tablet 0  . cholecalciferol (VITAMIN D) 1000 units tablet Take 2,000 Units by mouth daily.    .  citalopram (CELEXA) 20 MG tablet Take 20 mg by mouth daily.    . clonazePAM (KLONOPIN) 0.5 MG tablet Take 0.5 mg by mouth daily.  0  . dexamethasone (DECADRON) 4 MG tablet Take 5 tabs at night with food and 5 tabs at 6 am the morning of chemotherapy, every 3 weeks 60 tablet 0  . flecainide (TAMBOCOR) 50 MG tablet Take 1 tablet (50 mg total) by mouth daily. 90 tablet 3  . gabapentin (NEURONTIN) 300 MG capsule Take 300-600 mg by mouth 2 (two) times daily. 300 mg in the am and 600 mg at night    . Glucosamine-Chondroitin (GLUCOSAMINE CHONDR COMPLEX PO) Take 2 capsules by mouth daily.     Marland Kitchen HYDROmorphone (DILAUDID) 4 MG tablet Take 1 tablet (4 mg total) by mouth every 6 (six) hours as needed for severe pain. 60 tablet 0  . lidocaine-prilocaine (EMLA) cream Apply 1 application topically as needed. 30 g 0  . magnesium oxide (MAG-OX) 400 (241.3 Mg) MG tablet Take 1 tablet (400 mg total) by mouth 2 (two) times daily. 60 tablet 3  . metFORMIN (GLUCOPHAGE) 500 MG tablet Take 250 mg by mouth daily.  3  . metoprolol tartrate (LOPRESSOR) 50 MG tablet Take 1 tablet (50 mg total) by mouth daily. 90 tablet 3  . Omega-3 Fatty Acids (FISH OIL) 1000 MG CAPS Take 2 capsules by mouth daily.      . ondansetron (ZOFRAN) 8 MG tablet Take 1 tablet (8 mg total) by mouth every 8 (eight) hours as needed for nausea. 90 tablet 1  . polyethylene  glycol (MIRALAX / GLYCOLAX) packet Take 17 g by mouth daily. 14 each 0  . potassium chloride SA (K-DUR,KLOR-CON) 20 MEQ tablet Take 1 tablet (20 mEq total) by mouth 2 (two) times daily. 8 tablet 0  . prochlorperazine (COMPAZINE) 10 MG tablet Take 1 tablet (10 mg total) by mouth every 6 (six) hours as needed (Nausea or vomiting). 90 tablet 1  . rivaroxaban (XARELTO) 20 MG TABS tablet Take 1 tablet (20 mg total) by mouth daily. 30 tablet 0  . senna (SENOKOT) 8.6 MG TABS tablet Take 2 tablets (17.2 mg total) by mouth 2 (two) times daily. (Patient not taking: Reported on 04/13/2017) 120 each 0    No current facility-administered medications for this visit.    Facility-Administered Medications Ordered in Other Visits  Medication Dose Route Frequency Provider Last Rate Last Dose  . CARBOplatin (PARAPLATIN) 750 mg in sodium chloride 0.9 % 250 mL chemo infusion  750 mg Intravenous Once Alvy Bimler, Kyre Jeffries, MD      . heparin lock flush 100 unit/mL  500 Units Intracatheter Once PRN Alvy Bimler, Katalaya Beel, MD      . magnesium sulfate 2 g in sodium chloride 0.9 % 250 mL  2 g Intravenous Once Alvy Bimler, Maudean Hoffmann, MD      . PACLitaxel (TAXOL) 210 mg in sodium chloride 0.9 % 250 mL chemo infusion (> 35m/m2)  87.5 mg/m2 (Treatment Plan Recorded) Intravenous Once GAlvy Bimler Joesphine Schemm, MD 95 mL/hr at 06/28/17 1216 210 mg at 06/28/17 1216  . sodium chloride flush (NS) 0.9 % injection 10 mL  10 mL Intracatheter PRN GAlvy Bimler Lyzette Reinhardt, MD        PHYSICAL EXAMINATION: ECOG PERFORMANCE STATUS: 2 - Symptomatic, <50% confined to bed  Vitals:   06/28/17 0934  BP: (!) 127/91  Pulse: 80  Resp: 18  Temp: 98.5 F (36.9 C)  SpO2: 99%   Filed Weights   06/28/17 0934  Weight: 259 lb 11.2 oz (117.8 kg)    GENERAL:alert, no distress and comfortable SKIN: skin color, texture, turgor are normal, no rashes or significant lesions EYES: normal, Conjunctiva are pink and non-injected, sclera clear OROPHARYNX:no exudate, no erythema and lips, buccal mucosa, and tongue normal  NECK: supple, thyroid normal size, non-tender, without nodularity LYMPH:  no palpable lymphadenopathy in the cervical, axillary or inguinal LUNGS: clear to auscultation and percussion with normal breathing effort HEART: regular rate & rhythm and no murmurs and no lower extremity edema ABDOMEN:abdomen soft, non-tender and normal bowel sounds Musculoskeletal:no cyanosis of digits and no clubbing  NEURO: alert & oriented x 3 with fluent speech, no focal motor/sensory deficits  LABORATORY DATA:  I have reviewed the data as listed    Component Value Date/Time   NA 138  06/28/2017 0851   K 3.1 (L) 06/28/2017 0851   CL 95 (L) 06/28/2017 0851   CO2 29 06/28/2017 0851   GLUCOSE 211 (H) 06/28/2017 0851   BUN 11 06/28/2017 0851   CREATININE 0.81 06/28/2017 0851   CALCIUM 7.2 (L) 06/28/2017 0851   PROT 7.2 06/28/2017 0851   ALBUMIN 3.7 06/28/2017 0851   AST 43 (H) 06/28/2017 0851   ALT 55 06/28/2017 0851   ALKPHOS 75 06/28/2017 0851   BILITOT 0.6 06/28/2017 0851   GFRNONAA >60 06/28/2017 0851   GFRAA >60 06/28/2017 0851    No results found for: SPEP, UPEP  Lab Results  Component Value Date   WBC 5.1 06/28/2017   NEUTROABS 4.0 06/28/2017   HGB 10.4 (L) 06/28/2017   HCT 31.1 (L) 06/28/2017  MCV 91.7 06/28/2017   PLT 222 06/28/2017      Chemistry      Component Value Date/Time   NA 138 06/28/2017 0851   K 3.1 (L) 06/28/2017 0851   CL 95 (L) 06/28/2017 0851   CO2 29 06/28/2017 0851   BUN 11 06/28/2017 0851   CREATININE 0.81 06/28/2017 0851      Component Value Date/Time   CALCIUM 7.2 (L) 06/28/2017 0851   ALKPHOS 75 06/28/2017 0851   AST 43 (H) 06/28/2017 0851   ALT 55 06/28/2017 0851   BILITOT 0.6 06/28/2017 0851       RADIOGRAPHIC STUDIES: I have personally reviewed the radiological images as listed and agreed with the findings in the report. Dg Chest 2 View  Result Date: 06/16/2017 CLINICAL DATA:  Recent chemotherapy for pelvic cancer. Shortness of breath and tachycardia. EXAM: CHEST - 2 VIEW COMPARISON:  02/28/2017 FINDINGS: Artifact overlies the chest. Heart size is normal. Power port inserted from a right jugular approach has its tip in the SVC above the right atrium. Chronic scarring redemonstrated in the right infrahilar region. This appear slightly more prominent in there could be an element of active atelectasis. IMPRESSION: Previously seen scarring in the right infrahilar region and lower lung, similar or slightly more pronounced compared to the previous study. There could be some active atelectasis. Electronically Signed    By: Nelson Chimes M.D.   On: 06/16/2017 17:53    All questions were answered. The patient knows to call the clinic with any problems, questions or concerns. No barriers to learning was detected.  I spent 30 minutes counseling the patient face to face. The total time spent in the appointment was 40 minutes and more than 50% was on counseling and review of test results  [name], MD 06/28/2017 1:09 PM

## 2017-06-28 NOTE — Assessment & Plan Note (Signed)
She has symptoms of severe hypomagnesemia She will receive intravenous magnesium replacement, oral magnesium replacement therapy and we will continue to monitor her magnesium level carefully

## 2017-06-28 NOTE — Assessment & Plan Note (Signed)
The liver masses are improving in size Her liver enzymes had normalized We will continue to monitor her liver function carefully

## 2017-06-28 NOTE — Patient Instructions (Addendum)
[facility] Discharge Instructions for Patients Receiving Chemotherapy  Today you received the following chemotherapy agents Taxol and Carboplatin  To help prevent nausea and vomiting after your treatment, we encourage you to take your nausea medication as directed   If you develop nausea and vomiting that is not controlled by your nausea medication, call the clinic.   BELOW ARE SYMPTOMS THAT SHOULD BE REPORTED IMMEDIATELY:  *FEVER GREATER THAN 100.5 F  *CHILLS WITH OR WITHOUT FEVER  NAUSEA AND VOMITING THAT IS NOT CONTROLLED WITH YOUR NAUSEA MEDICATION  *UNUSUAL SHORTNESS OF BREATH  *UNUSUAL BRUISING OR BLEEDING  TENDERNESS IN MOUTH AND THROAT WITH OR WITHOUT PRESENCE OF ULCERS  *URINARY PROBLEMS  *BOWEL PROBLEMS  UNUSUAL RASH Items with * indicate a potential emergency and should be followed up as soon as possible.  Feel free to call the clinic should you have any questions or concerns. The clinic phone number is [phone].  Please show the CHEMO ALERT CARD at check-in to the Emergency Department and triage nurse.    Hypomagnesemia Hypomagnesemia is a condition in which the level of magnesium in the blood is low. Magnesium is a mineral that is found in many foods. It is used in many different processes in the body. Hypomagnesemia can affect every organ in the body. It can cause life-threatening problems. What are the causes? Causes of hypomagnesemia include:  Not getting enough magnesium in your diet.  Malnutrition.  Problems with absorbing magnesium from the intestines.  Dehydration.  Alcohol abuse.  Vomiting.  Severe diarrhea.  Some medicines, including medicines that make you urinate more.  Certain diseases, such as kidney disease, diabetes, and overactive thyroid.  What are the signs or symptoms?  Involuntary shaking or trembling of a body part (tremor).  Confusion.  Muscle weakness.  Sensitivity to light, sound, and  touch.  Psychiatric issues, such as depression, irritability, or psychosis.  Sudden tightening of muscles (muscle spasms).  Tingling in the arms and legs.  A feeling of fluttering of the heart. These symptoms are more severe if magnesium levels drop suddenly. How is this diagnosed? To make a diagnosis, your health care provider will do a physical exam and order blood and urine tests. How is this treated? Treatment will depend on the cause and the severity of your condition. It may involve:  A magnesium supplement. This can be taken in pill form. It can also be given through an IV tube. This is usually done if the condition is severe.  Changes to your diet. You may be directed to eat foods that have a lot of magnesium, such as green leafy vegetables, peas, beans, and nuts.  Eliminating alcohol from your diet.  Follow these instructions at home:  Include foods with magnesium in your diet. Foods that are rich in magnesium include green vegetables, beans, nuts and seeds, and whole grains.  Take medicines only as directed by your health care provider.  Take magnesium supplements if your health care provider instructs you to do that. Take them as directed.  Have your magnesium levels monitored as directed by your health care provider.  When you are active, drink fluids that contain electrolytes.  Keep all follow-up visits as directed by your health care provider. This is important. Contact a health care provider if:  You get worse instead of better.  Your symptoms return. Get help right away if:  Your symptoms are severe. This information is not intended to replace advice given to you by your health   care provider. Make sure you discuss any questions you have with your health care provider. Document Released: 11/04/2004 Document Revised: 07/17/2015 Document Reviewed: 09/24/2013 Elsevier Interactive Patient Education  2018 [employer]  

## 2017-06-28 NOTE — Assessment & Plan Note (Signed)
She had recurrent severe hypokalemia Furosemide was discontinued She had received potassium replacement therapy I recommend potassium rich diet

## 2017-07-19 ENCOUNTER — Inpatient Hospital Stay: Payer: 59

## 2017-07-19 ENCOUNTER — Encounter: Payer: Self-pay | Admitting: *Deleted

## 2017-07-19 ENCOUNTER — Encounter: Payer: Self-pay | Admitting: Hematology and Oncology

## 2017-07-19 ENCOUNTER — Telehealth: Payer: Self-pay | Admitting: Hematology and Oncology

## 2017-07-19 ENCOUNTER — Inpatient Hospital Stay (HOSPITAL_BASED_OUTPATIENT_CLINIC_OR_DEPARTMENT_OTHER): Payer: 59 | Admitting: Hematology and Oncology

## 2017-07-19 VITALS — BP 122/67 | HR 72 | Temp 99.3°F | Resp 18 | Ht 67.0 in | Wt 264.3 lb

## 2017-07-19 DIAGNOSIS — Z7984 Long term (current) use of oral hypoglycemic drugs: Secondary | ICD-10-CM

## 2017-07-19 DIAGNOSIS — C787 Secondary malignant neoplasm of liver and intrahepatic bile duct: Secondary | ICD-10-CM | POA: Diagnosis not present

## 2017-07-19 DIAGNOSIS — C801 Malignant (primary) neoplasm, unspecified: Secondary | ICD-10-CM | POA: Diagnosis not present

## 2017-07-19 DIAGNOSIS — R55 Syncope and collapse: Secondary | ICD-10-CM

## 2017-07-19 DIAGNOSIS — T451X5A Adverse effect of antineoplastic and immunosuppressive drugs, initial encounter: Secondary | ICD-10-CM | POA: Insufficient documentation

## 2017-07-19 DIAGNOSIS — G620 Drug-induced polyneuropathy: Secondary | ICD-10-CM

## 2017-07-19 DIAGNOSIS — Z95828 Presence of other vascular implants and grafts: Secondary | ICD-10-CM

## 2017-07-19 DIAGNOSIS — R531 Weakness: Secondary | ICD-10-CM | POA: Diagnosis not present

## 2017-07-19 DIAGNOSIS — G893 Neoplasm related pain (acute) (chronic): Secondary | ICD-10-CM

## 2017-07-19 DIAGNOSIS — Z5111 Encounter for antineoplastic chemotherapy: Secondary | ICD-10-CM | POA: Diagnosis not present

## 2017-07-19 DIAGNOSIS — Z7901 Long term (current) use of anticoagulants: Secondary | ICD-10-CM

## 2017-07-19 DIAGNOSIS — Z79899 Other long term (current) drug therapy: Secondary | ICD-10-CM

## 2017-07-19 DIAGNOSIS — G63 Polyneuropathy in diseases classified elsewhere: Secondary | ICD-10-CM

## 2017-07-19 DIAGNOSIS — E876 Hypokalemia: Secondary | ICD-10-CM

## 2017-07-19 DIAGNOSIS — D6481 Anemia due to antineoplastic chemotherapy: Secondary | ICD-10-CM | POA: Diagnosis not present

## 2017-07-19 DIAGNOSIS — Z923 Personal history of irradiation: Secondary | ICD-10-CM

## 2017-07-19 DIAGNOSIS — E099 Drug or chemical induced diabetes mellitus without complications: Secondary | ICD-10-CM

## 2017-07-19 LAB — COMPREHENSIVE METABOLIC PANEL
ALT: 68 U/L — AB (ref 14–54)
AST: 71 U/L — ABNORMAL HIGH (ref 15–41)
Albumin: 3.8 g/dL (ref 3.5–5.0)
Alkaline Phosphatase: 65 U/L (ref 38–126)
Anion gap: 17 — ABNORMAL HIGH (ref 5–15)
BILIRUBIN TOTAL: 0.6 mg/dL (ref 0.3–1.2)
BUN: 10 mg/dL (ref 6–20)
CALCIUM: 8.2 mg/dL — AB (ref 8.9–10.3)
CHLORIDE: 97 mmol/L — AB (ref 101–111)
CO2: 22 mmol/L (ref 22–32)
CREATININE: 0.61 mg/dL (ref 0.44–1.00)
Glucose, Bld: 243 mg/dL — ABNORMAL HIGH (ref 65–99)
Potassium: 3.8 mmol/L (ref 3.5–5.1)
Sodium: 136 mmol/L (ref 135–145)
Total Protein: 7.2 g/dL (ref 6.5–8.1)

## 2017-07-19 LAB — CBC WITH DIFFERENTIAL (CANCER CENTER ONLY)
BASOS PCT: 0 %
Basophils Absolute: 0 10*3/uL (ref 0.0–0.1)
EOS ABS: 0 10*3/uL (ref 0.0–0.5)
Eosinophils Relative: 0 %
HCT: 30.7 % — ABNORMAL LOW (ref 34.8–46.6)
HEMOGLOBIN: 10.3 g/dL — AB (ref 11.6–15.9)
LYMPHS ABS: 1.2 10*3/uL (ref 0.9–3.3)
Lymphocytes Relative: 23 %
MCH: 31.3 pg (ref 25.1–34.0)
MCHC: 33.6 g/dL (ref 31.5–36.0)
MCV: 93.3 fL (ref 79.5–101.0)
MONO ABS: 0.2 10*3/uL (ref 0.1–0.9)
MONOS PCT: 4 %
NEUTROS ABS: 4 10*3/uL (ref 1.5–6.5)
Neutrophils Relative %: 73 %
Platelet Count: 186 10*3/uL (ref 145–400)
RBC: 3.29 MIL/uL — ABNORMAL LOW (ref 3.70–5.45)
RDW: 18.4 % — ABNORMAL HIGH (ref 11.2–14.5)
WBC Count: 5.4 10*3/uL (ref 3.9–10.3)
nRBC: 1 /100 WBC — ABNORMAL HIGH

## 2017-07-19 LAB — MAGNESIUM: MAGNESIUM: 1.1 mg/dL — AB (ref 1.7–2.4)

## 2017-07-19 MED ORDER — SODIUM CHLORIDE 0.9% FLUSH
10.0000 mL | Freq: Once | INTRAVENOUS | Status: AC
  Administered 2017-07-19: 10 mL
  Filled 2017-07-19: qty 10

## 2017-07-19 MED ORDER — DIPHENHYDRAMINE HCL 50 MG/ML IJ SOLN
12.5000 mg | Freq: Once | INTRAMUSCULAR | Status: AC
  Administered 2017-07-19: 12.5 mg via INTRAVENOUS

## 2017-07-19 MED ORDER — FAMOTIDINE IN NACL 20-0.9 MG/50ML-% IV SOLN
INTRAVENOUS | Status: AC
  Filled 2017-07-19: qty 50

## 2017-07-19 MED ORDER — SODIUM CHLORIDE 0.9 % IV SOLN
Freq: Once | INTRAVENOUS | Status: AC
  Administered 2017-07-19: 12:00:00 via INTRAVENOUS
  Filled 2017-07-19: qty 5

## 2017-07-19 MED ORDER — SODIUM CHLORIDE 0.9 % IV SOLN
750.0000 mg | Freq: Once | INTRAVENOUS | Status: AC
  Administered 2017-07-19: 750 mg via INTRAVENOUS
  Filled 2017-07-19: qty 75

## 2017-07-19 MED ORDER — SODIUM CHLORIDE 0.9 % IV SOLN
Freq: Once | INTRAVENOUS | Status: AC
  Administered 2017-07-19: 11:00:00 via INTRAVENOUS

## 2017-07-19 MED ORDER — PALONOSETRON HCL INJECTION 0.25 MG/5ML
INTRAVENOUS | Status: AC
  Filled 2017-07-19: qty 5

## 2017-07-19 MED ORDER — SODIUM CHLORIDE 0.9% FLUSH
10.0000 mL | INTRAVENOUS | Status: DC | PRN
  Administered 2017-07-19: 10 mL
  Filled 2017-07-19: qty 10

## 2017-07-19 MED ORDER — HEPARIN SOD (PORK) LOCK FLUSH 100 UNIT/ML IV SOLN
500.0000 [IU] | Freq: Once | INTRAVENOUS | Status: AC | PRN
  Administered 2017-07-19: 500 [IU]
  Filled 2017-07-19: qty 5

## 2017-07-19 MED ORDER — SODIUM CHLORIDE 0.9 % IV SOLN
87.5000 mg/m2 | Freq: Once | INTRAVENOUS | Status: AC
  Administered 2017-07-19: 210 mg via INTRAVENOUS
  Filled 2017-07-19: qty 35

## 2017-07-19 MED ORDER — PALONOSETRON HCL INJECTION 0.25 MG/5ML
0.2500 mg | Freq: Once | INTRAVENOUS | Status: AC
  Administered 2017-07-19: 0.25 mg via INTRAVENOUS

## 2017-07-19 MED ORDER — DIPHENHYDRAMINE HCL 50 MG/ML IJ SOLN
INTRAMUSCULAR | Status: AC
  Filled 2017-07-19: qty 1

## 2017-07-19 MED ORDER — FAMOTIDINE IN NACL 20-0.9 MG/50ML-% IV SOLN
20.0000 mg | Freq: Once | INTRAVENOUS | Status: AC
  Administered 2017-07-19: 20 mg via INTRAVENOUS

## 2017-07-19 NOTE — Assessment & Plan Note (Addendum)
She tolerated treatment well except for some persistent weakness and neuropathy affecting the left foot We will continue similar dose with dose adjustment Due to her indeterminate decision about Avastin, I recommend we proceed to complete 6 cycles of chemotherapy and repeat staging scan before we decide whether she wants to go on maintenance treatment with Avastin We discussed the risk and benefits of Avastin I told her with her stage IV disease, the patient is unresectable and it is unlikely that she will be able to following her surgery and that would resect all remaining disease After much discussion, she is in agreement with the plan of care to start Avastin next month, provided CT scan continued to show improved disease control

## 2017-07-19 NOTE — Assessment & Plan Note (Signed)
She has severe neuropathy affecting the left foot Plan to continue on reduced dose of Taxol She will continue gabapentin and pain medicine as needed She will also continue cancer rehab

## 2017-07-19 NOTE — Telephone Encounter (Signed)
Per 5/28 patient declined avs and calendar

## 2017-07-19 NOTE — Progress Notes (Signed)
[place] OFFICE PROGRESS NOTE  Patient Care Team: Lennie Odor, PA-C as PCP - General (Nurse Practitioner) Arvella Nigh, MD as Obstetrician (Obstetrics and Gynecology) Evans Lance, MD as Consulting Physician (Cardiology)  ASSESSMENT & PLAN:  Cancer of unknown origin [facility]) She tolerated treatment well except for some persistent weakness and neuropathy affecting the left foot We will continue similar dose with dose adjustment Due to her indeterminate decision about Avastin, I recommend we proceed to complete 6 cycles of chemotherapy and repeat staging scan before we decide whether she wants to go on maintenance treatment with Avastin We discussed the risk and benefits of Avastin I told her with her stage IV disease, the patient is unresectable and it is unlikely that she will be able to following her surgery and that would resect all remaining disease After much discussion, she is in agreement with the plan of care to start Avastin next month, provided CT scan continued to show improved disease control  Metastasis to liver [facility]) The liver masses are improving in size Her liver enzymes had normalized We will continue to monitor her liver function carefully  Neuropathy associated with cancer [facility]) She has severe neuropathy affecting the left foot Plan to continue on reduced dose of Taxol She will continue gabapentin and pain medicine as needed She will also continue cancer rehab  Anemia due to antineoplastic chemotherapy This is likely due to recent treatment. The patient denies recent history of bleeding such as epistaxis, hematuria or hematochezia. She is asymptomatic from the anemia. I will observe for now.  She does not require transfusion now. I will continue the chemotherapy at current dose without dosage adjustment.  If the anemia gets progressive worse in the future, I might have to delay her treatment or adjust the chemotherapy dose.   Cancer associated  pain She has persistent cancer associated pain Her current prescription pain medicine is helpful She also have occasional bone pain after chemotherapy that is helped by hydromorphone She will continue the same regimen for now  Hypomagnesemia She has recent profound electrolyte imbalance with low magnesium and potassium I recommend she continues magnesium supplement for the next few months and we will continue close monitoring   Orders Placed This Encounter  Procedures  . CT ABDOMEN PELVIS W CONTRAST    Standing Status:   Future    Standing Expiration Date:   07/20/2018    Order Specific Question:   If indicated for the ordered procedure, I authorize the administration of contrast media per Radiology protocol    Answer:   Yes    Order Specific Question:   Preferred imaging location?    Answer:   [facility]    Order Specific Question:   Radiology Contrast Protocol - do NOT remove file path    Answer:   [email or web address]    Order Specific Question:   Is patient pregnant?    Answer:   No    INTERVAL HISTORY: Please see below for problem oriented charting. She returns for further follow-up She continues to have intermittent pelvic pain and neuropathic pain, well controlled with current prescription pain medicine She had recent loose stool that resolved spontaneously No nausea No recent infection She denies abnormal vaginal bleeding or bloating Her appetite is stable Her blood sugar is stable  SUMMARY OF ONCOLOGIC HISTORY: Oncology History   Biothernostics: 96% probability cervical cancer  Additional evaluation revealed PTEN deletion, negative for MET amplification, PD-L1 expression of 60     Cancer  of unknown origin ([place])   03/03/2017 Imaging    1. Multifocal liver metastasis. 2. Left posterior pelvic mass extending into the obturator foramen measures up to 7 cm. Suspicious for malignancy. This should be easily amendable to percutaneous  tissue sampling under image guidance. 3. No lytic bone lesions identified of myeloma.      03/09/2017 Procedure    Technically successful ultrasound-guided core liver lesion biopsy      03/09/2017 Pathology Results    A. LIVER LESION, RIGHT LOBE; ULTRASOUND-GUIDED BIOPSY:  - METASTATIC SQUAMOUS CELL CARCINOMA, SEE COMMENT.   Comment: Metastatic carcinoma is immunoreactive for p40, p16, p53 and pancytokeratin while CK7, CK20, and TTF1 are negative. Significance of p16 positivity depends on the site of origin and the pattern of immunoreactivity in this material is non-specific. Possible sites of origin include gynecologic, anal, lung, and head/neck. Correlation with physical exam and additional imaging is required.       03/18/2017 Pathology Results    She had GYN exam and pap smear. Result is negative for malignancy      03/21/2017 - 04/01/2017 Hospital Admission    She was admitted to the hospital for pain management and had radiation therapy      03/23/2017 Procedure    Ultrasound and fluoroscopically guided right internal jugular single lumen power port catheter insertion. Tip in the SVC/RA junction. Catheter ready for use.      05/16/2017 Imaging    Mild decrease in size of left posterior pelvic soft tissue mass and liver metastases. No new or progressive metastatic disease identified.  Stable hepatic steatosis and tiny nonobstructing right renal calculus.       REVIEW OF SYSTEMS:   Constitutional: Denies fevers, chills or abnormal weight loss Eyes: Denies blurriness of vision Ears, nose, mouth, throat, and face: Denies mucositis or sore throat Respiratory: Denies cough, dyspnea or wheezes Cardiovascular: Denies palpitation, chest discomfort or lower extremity swelling Skin: Denies abnormal skin rashes Lymphatics: Denies new lymphadenopathy or easy bruising Neurological:Denies numbness, tingling or new weaknesses Behavioral/Psych: Mood is stable, no new changes  All other  systems were reviewed with the patient and are negative.  I have reviewed the past medical history, past surgical history, social history and family history with the patient and they are unchanged from previous note.  ALLERGIES:  has No Known Allergies.  MEDICATIONS:  Current Outpatient Medications  Medication Sig Dispense Refill  . ALPRAZolam (XANAX) 0.25 MG tablet Take 1 tablet (0.25 mg total) by mouth 3 (three) times daily as needed for sleep. 60 tablet 0  . atorvastatin (LIPITOR) 40 MG tablet Take 1 tablet (40 mg total) by mouth daily at 6 PM. 30 tablet 0  . cholecalciferol (VITAMIN D) 1000 units tablet Take 2,000 Units by mouth daily.    . citalopram (CELEXA) 20 MG tablet Take 20 mg by mouth daily.    . clonazePAM (KLONOPIN) 0.5 MG tablet Take 0.5 mg by mouth daily.  0  . dexamethasone (DECADRON) 4 MG tablet Take 5 tabs at night with food and 5 tabs at 6 am the morning of chemotherapy, every 3 weeks 60 tablet 0  . flecainide (TAMBOCOR) 50 MG tablet Take 1 tablet (50 mg total) by mouth daily. 90 tablet 3  . gabapentin (NEURONTIN) 300 MG capsule Take 300-600 mg by mouth 2 (two) times daily. 300 mg in the am and 600 mg at night    . Glucosamine-Chondroitin (GLUCOSAMINE CHONDR COMPLEX PO) Take 2 capsules by mouth daily.     Marland Kitchen  HYDROmorphone (DILAUDID) 4 MG tablet Take 1 tablet (4 mg total) by mouth every 6 (six) hours as needed for severe pain. 60 tablet 0  . lidocaine-prilocaine (EMLA) cream Apply 1 application topically as needed. 30 g 0  . magnesium oxide (MAG-OX) 400 (241.3 Mg) MG tablet Take 1 tablet (400 mg total) by mouth 2 (two) times daily. 60 tablet 3  . metFORMIN (GLUCOPHAGE) 500 MG tablet Take 250 mg by mouth daily.  3  . metoprolol tartrate (LOPRESSOR) 50 MG tablet Take 1 tablet (50 mg total) by mouth daily. 90 tablet 3  . Omega-3 Fatty Acids (FISH OIL) 1000 MG CAPS Take 2 capsules by mouth daily.      . ondansetron (ZOFRAN) 8 MG tablet Take 1 tablet (8 mg total) by mouth every 8  (eight) hours as needed for nausea. 90 tablet 1  . polyethylene glycol (MIRALAX / GLYCOLAX) packet Take 17 g by mouth daily. 14 each 0  . potassium chloride SA (K-DUR,KLOR-CON) 20 MEQ tablet Take 1 tablet (20 mEq total) by mouth 2 (two) times daily. 8 tablet 0  . prochlorperazine (COMPAZINE) 10 MG tablet Take 1 tablet (10 mg total) by mouth every 6 (six) hours as needed (Nausea or vomiting). 90 tablet 1  . rivaroxaban (XARELTO) 20 MG TABS tablet Take 1 tablet (20 mg total) by mouth daily. 30 tablet 0  . senna (SENOKOT) 8.6 MG TABS tablet Take 2 tablets (17.2 mg total) by mouth 2 (two) times daily. (Patient not taking: Reported on 04/13/2017) 120 each 0   No current facility-administered medications for this visit.     PHYSICAL EXAMINATION: ECOG PERFORMANCE STATUS: 1 - Symptomatic but completely ambulatory  Vitals:   07/19/17 0956  BP: 122/67  Pulse: 72  Resp: 18  Temp: 99.3 F (37.4 C)  SpO2: 100%   Filed Weights   07/19/17 0956  Weight: 264 lb 4.8 oz (119.9 kg)    GENERAL:alert, no distress and comfortable SKIN: skin color, texture, turgor are normal, no rashes or significant lesions EYES: normal, Conjunctiva are pink and non-injected, sclera clear OROPHARYNX:no exudate, no erythema and lips, buccal mucosa, and tongue normal  NECK: supple, thyroid normal size, non-tender, without nodularity LYMPH:  no palpable lymphadenopathy in the cervical, axillary or inguinal LUNGS: clear to auscultation and percussion with normal breathing effort HEART: regular rate & rhythm and no murmurs and no lower extremity edema ABDOMEN:abdomen soft, non-tender and normal bowel sounds Musculoskeletal:no cyanosis of digits and no clubbing  NEURO: alert & oriented x 3 with fluent speech, no focal motor/sensory deficits  LABORATORY DATA:  I have reviewed the data as listed    Component Value Date/Time   NA 138 06/28/2017 0851   K 3.1 (L) 06/28/2017 0851   CL 95 (L) 06/28/2017 0851   CO2 29  06/28/2017 0851   GLUCOSE 211 (H) 06/28/2017 0851   BUN 11 06/28/2017 0851   CREATININE 0.81 06/28/2017 0851   CALCIUM 7.2 (L) 06/28/2017 0851   PROT 7.2 06/28/2017 0851   ALBUMIN 3.7 06/28/2017 0851   AST 43 (H) 06/28/2017 0851   ALT 55 06/28/2017 0851   ALKPHOS 75 06/28/2017 0851   BILITOT 0.6 06/28/2017 0851   GFRNONAA >60 06/28/2017 0851   GFRAA >60 06/28/2017 0851    No results found for: SPEP, UPEP  Lab Results  Component Value Date   WBC 5.4 07/19/2017   NEUTROABS 4.0 07/19/2017   HGB 10.3 (L) 07/19/2017   HCT 30.7 (L) 07/19/2017   MCV 93.3 07/19/2017  PLT 186 07/19/2017      Chemistry      Component Value Date/Time   NA 138 06/28/2017 0851   K 3.1 (L) 06/28/2017 0851   CL 95 (L) 06/28/2017 0851   CO2 29 06/28/2017 0851   BUN 11 06/28/2017 0851   CREATININE 0.81 06/28/2017 0851      Component Value Date/Time   CALCIUM 7.2 (L) 06/28/2017 0851   ALKPHOS 75 06/28/2017 0851   AST 43 (H) 06/28/2017 0851   ALT 55 06/28/2017 0851   BILITOT 0.6 06/28/2017 0851      All questions were answered. The patient knows to call the clinic with any problems, questions or concerns. No barriers to learning was detected.  I spent 25 minutes counseling the patient face to face. The total time spent in the appointment was 30 minutes and more than 50% was on counseling and review of test results  [name], MD 07/19/2017 10:21 AM

## 2017-07-19 NOTE — Patient Instructions (Signed)
[facility] Discharge Instructions for Patients Receiving Chemotherapy  Today you received the following chemotherapy agents Taxol and Carboplatin  To help prevent nausea and vomiting after your treatment, we encourage you to take your nausea medication as directed   If you develop nausea and vomiting that is not controlled by your nausea medication, call the clinic.   BELOW ARE SYMPTOMS THAT SHOULD BE REPORTED IMMEDIATELY:  *FEVER GREATER THAN 100.5 F  *CHILLS WITH OR WITHOUT FEVER  NAUSEA AND VOMITING THAT IS NOT CONTROLLED WITH YOUR NAUSEA MEDICATION  *UNUSUAL SHORTNESS OF BREATH  *UNUSUAL BRUISING OR BLEEDING  TENDERNESS IN MOUTH AND THROAT WITH OR WITHOUT PRESENCE OF ULCERS  *URINARY PROBLEMS  *BOWEL PROBLEMS  UNUSUAL RASH Items with * indicate a potential emergency and should be followed up as soon as possible.  Feel free to call the clinic should you have any questions or concerns. The clinic phone number is [phone].  Please show the CHEMO ALERT CARD at check-in to the Emergency Department and triage nurse.    Hypomagnesemia Hypomagnesemia is a condition in which the level of magnesium in the blood is low. Magnesium is a mineral that is found in many foods. It is used in many different processes in the body. Hypomagnesemia can affect every organ in the body. It can cause life-threatening problems. What are the causes? Causes of hypomagnesemia include:  Not getting enough magnesium in your diet.  Malnutrition.  Problems with absorbing magnesium from the intestines.  Dehydration.  Alcohol abuse.  Vomiting.  Severe diarrhea.  Some medicines, including medicines that make you urinate more.  Certain diseases, such as kidney disease, diabetes, and overactive thyroid.  What are the signs or symptoms?  Involuntary shaking or trembling of a body part (tremor).  Confusion.  Muscle weakness.  Sensitivity to light, sound, and  touch.  Psychiatric issues, such as depression, irritability, or psychosis.  Sudden tightening of muscles (muscle spasms).  Tingling in the arms and legs.  A feeling of fluttering of the heart. These symptoms are more severe if magnesium levels drop suddenly. How is this diagnosed? To make a diagnosis, your health care provider will do a physical exam and order blood and urine tests. How is this treated? Treatment will depend on the cause and the severity of your condition. It may involve:  A magnesium supplement. This can be taken in pill form. It can also be given through an IV tube. This is usually done if the condition is severe.  Changes to your diet. You may be directed to eat foods that have a lot of magnesium, such as green leafy vegetables, peas, beans, and nuts.  Eliminating alcohol from your diet.  Follow these instructions at home:  Include foods with magnesium in your diet. Foods that are rich in magnesium include green vegetables, beans, nuts and seeds, and whole grains.  Take medicines only as directed by your health care provider.  Take magnesium supplements if your health care provider instructs you to do that. Take them as directed.  Have your magnesium levels monitored as directed by your health care provider.  When you are active, drink fluids that contain electrolytes.  Keep all follow-up visits as directed by your health care provider. This is important. Contact a health care provider if:  You get worse instead of better.  Your symptoms return. Get help right away if:  Your symptoms are severe. This information is not intended to replace advice given to you by your health   care provider. Make sure you discuss any questions you have with your health care provider. Document Released: 11/04/2004 Document Revised: 07/17/2015 Document Reviewed: 09/24/2013 Elsevier Interactive Patient Education  2018 [employer]  

## 2017-07-19 NOTE — Assessment & Plan Note (Signed)
The liver masses are improving in size Her liver enzymes had normalized We will continue to monitor her liver function carefully

## 2017-07-19 NOTE — Assessment & Plan Note (Signed)
She has persistent cancer associated pain Her current prescription pain medicine is helpful She also have occasional bone pain after chemotherapy that is helped by hydromorphone She will continue the same regimen for now

## 2017-07-19 NOTE — Progress Notes (Unsigned)
Critical magnesium given to [place]

## 2017-07-19 NOTE — Assessment & Plan Note (Signed)
She has recent profound electrolyte imbalance with low magnesium and potassium I recommend she continues magnesium supplement for the next few months and we will continue close monitoring

## 2017-07-19 NOTE — Assessment & Plan Note (Signed)

## 2017-07-20 ENCOUNTER — Other Ambulatory Visit: Payer: Self-pay | Admitting: Hematology and Oncology

## 2017-07-20 DIAGNOSIS — C539 Malignant neoplasm of cervix uteri, unspecified: Secondary | ICD-10-CM | POA: Insufficient documentation

## 2017-07-20 NOTE — Progress Notes (Signed)
START OFF PATHWAY REGIMEN - [Other Dx]   [id number]:Bevacizumab 15 mg/kg q21d:   A cycle is every 21 days:     Bevacizumab   **Always confirm dose/schedule in your pharmacy ordering system**    Patient Characteristics: Intent of Therapy: Non-Curative / Palliative Intent, Discussed with Patient

## 2017-07-26 ENCOUNTER — Other Ambulatory Visit: Payer: Self-pay | Admitting: Hematology and Oncology

## 2017-07-26 ENCOUNTER — Encounter (HOSPITAL_COMMUNITY): Payer: Self-pay

## 2017-07-26 ENCOUNTER — Emergency Department (HOSPITAL_COMMUNITY)
Admission: EM | Admit: 2017-07-26 | Discharge: 2017-07-26 | Disposition: A | Discharge status: Home / Self Care | Payer: 59 | Attending: Emergency Medicine | Admitting: Emergency Medicine

## 2017-07-26 DIAGNOSIS — C787 Secondary malignant neoplasm of liver and intrahepatic bile duct: Secondary | ICD-10-CM

## 2017-07-26 DIAGNOSIS — Z7901 Long term (current) use of anticoagulants: Secondary | ICD-10-CM | POA: Insufficient documentation

## 2017-07-26 DIAGNOSIS — C801 Malignant (primary) neoplasm, unspecified: Secondary | ICD-10-CM | POA: Insufficient documentation

## 2017-07-26 DIAGNOSIS — R112 Nausea with vomiting, unspecified: Secondary | ICD-10-CM | POA: Diagnosis not present

## 2017-07-26 DIAGNOSIS — E876 Hypokalemia: Secondary | ICD-10-CM

## 2017-07-26 DIAGNOSIS — D701 Agranulocytosis secondary to cancer chemotherapy: Secondary | ICD-10-CM | POA: Insufficient documentation

## 2017-07-26 DIAGNOSIS — T451X5A Adverse effect of antineoplastic and immunosuppressive drugs, initial encounter: Secondary | ICD-10-CM | POA: Insufficient documentation

## 2017-07-26 DIAGNOSIS — Z8673 Personal history of transient ischemic attack (TIA), and cerebral infarction without residual deficits: Secondary | ICD-10-CM | POA: Diagnosis not present

## 2017-07-26 DIAGNOSIS — Z7984 Long term (current) use of oral hypoglycemic drugs: Secondary | ICD-10-CM | POA: Diagnosis not present

## 2017-07-26 DIAGNOSIS — Z87891 Personal history of nicotine dependence: Secondary | ICD-10-CM | POA: Insufficient documentation

## 2017-07-26 DIAGNOSIS — I10 Essential (primary) hypertension: Secondary | ICD-10-CM | POA: Diagnosis not present

## 2017-07-26 DIAGNOSIS — D6481 Anemia due to antineoplastic chemotherapy: Secondary | ICD-10-CM | POA: Diagnosis not present

## 2017-07-26 DIAGNOSIS — D649 Anemia, unspecified: Secondary | ICD-10-CM | POA: Diagnosis not present

## 2017-07-26 DIAGNOSIS — R630 Anorexia: Secondary | ICD-10-CM | POA: Insufficient documentation

## 2017-07-26 DIAGNOSIS — C539 Malignant neoplasm of cervix uteri, unspecified: Secondary | ICD-10-CM

## 2017-07-26 LAB — MAGNESIUM: MAGNESIUM: 1.1 mg/dL — AB (ref 1.7–2.4)

## 2017-07-26 LAB — BASIC METABOLIC PANEL
Anion gap: 15 (ref 5–15)
BUN: 14 mg/dL (ref 6–20)
CALCIUM: 8.1 mg/dL — AB (ref 8.9–10.3)
CO2: 25 mmol/L (ref 22–32)
Chloride: 96 mmol/L — ABNORMAL LOW (ref 101–111)
Creatinine, Ser: 0.65 mg/dL (ref 0.44–1.00)
GFR calc non Af Amer: >60 mL/min (ref >60)
GLUCOSE: 226 mg/dL — AB (ref 65–99)
Potassium: 3 mmol/L — ABNORMAL LOW (ref 3.5–5.1)
Sodium: 136 mmol/L (ref 135–145)

## 2017-07-26 LAB — CBC WITH DIFFERENTIAL/PLATELET
BASOS PCT: 0 %
Basophils Absolute: 0 10*3/uL (ref 0.0–0.1)
EOS ABS: 0 10*3/uL (ref 0.0–0.7)
EOS PCT: 0 %
HCT: 29.7 % — ABNORMAL LOW (ref 36.0–46.0)
Hemoglobin: 10.1 g/dL — ABNORMAL LOW (ref 12.0–15.0)
LYMPHS ABS: 1.9 10*3/uL (ref 0.7–4.0)
Lymphocytes Relative: 61 %
MCH: 32.1 pg (ref 26.0–34.0)
MCHC: 34 g/dL (ref 30.0–36.0)
MCV: 94.3 fL (ref 78.0–100.0)
MONO ABS: 0.1 10*3/uL (ref 0.1–1.0)
MONOS PCT: 4 %
Neutro Abs: 1.1 10*3/uL — ABNORMAL LOW (ref 1.7–7.7)
Neutrophils Relative %: 35 %
Platelets: 191 10*3/uL (ref 150–400)
RBC: 3.15 MIL/uL — ABNORMAL LOW (ref 3.87–5.11)
RDW: 16.4 % — AB (ref 11.5–15.5)
WBC: 3.1 10*3/uL — ABNORMAL LOW (ref 4.0–10.5)

## 2017-07-26 MED ORDER — POTASSIUM CHLORIDE CRYS ER 20 MEQ PO TBCR: 20.0000 meq | EXTENDED_RELEASE_TABLET | Freq: Two times a day (BID) | ORAL | 0 refills | Status: DC

## 2017-07-26 MED ORDER — HEPARIN SOD (PORK) LOCK FLUSH 100 UNIT/ML IV SOLN
500.0000 [IU] | Freq: Once | INTRAVENOUS | Status: AC
  Administered 2017-07-26: 500 [IU]
  Filled 2017-07-26: qty 5

## 2017-07-26 MED ORDER — POTASSIUM CHLORIDE 10 MEQ/100ML IV SOLN
10.0000 meq | INTRAVENOUS | Status: AC
  Administered 2017-07-26: 10 meq via INTRAVENOUS
  Filled 2017-07-26: qty 100

## 2017-07-26 MED ORDER — SODIUM CHLORIDE 0.9 % IV BOLUS
1000.0000 mL | Freq: Once | INTRAVENOUS | Status: AC
  Administered 2017-07-26: 1000 mL via INTRAVENOUS

## 2017-07-26 MED ORDER — MAGNESIUM SULFATE 2 GM/50ML IV SOLN
2.0000 g | Freq: Once | INTRAVENOUS | Status: AC
  Administered 2017-07-26: 2 g via INTRAVENOUS
  Filled 2017-07-26: qty 50

## 2017-07-26 MED ORDER — ONDANSETRON HCL 4 MG/2ML IJ SOLN
4.0000 mg | Freq: Once | INTRAMUSCULAR | Status: AC
  Administered 2017-07-26: 4 mg via INTRAVENOUS
  Filled 2017-07-26: qty 2

## 2017-07-26 MED ORDER — METOCLOPRAMIDE HCL 10 MG PO TABS: 10.0000 mg | ORAL_TABLET | Freq: Four times a day (QID) | ORAL | 0 refills | Status: DC | PRN

## 2017-07-26 MED ORDER — POTASSIUM CHLORIDE CRYS ER 20 MEQ PO TBCR
40.0000 meq | EXTENDED_RELEASE_TABLET | Freq: Once | ORAL | Status: AC
  Administered 2017-07-26: 40 meq via ORAL
  Filled 2017-07-26: qty 2

## 2017-07-26 NOTE — ED Notes (Signed)
Pt given a coke and crackers

## 2017-07-26 NOTE — ED Provider Notes (Signed)
[facility] DEPT Provider Note   CSN: [id number] Arrival date & time: 07/26/17  0018     History   Chief Complaint Chief Complaint  Patient presents with  . Tachycardia  . Nausea    HPI Cindy Faulkner is a 52 y.o. female.  The history is provided by the patient.  She has a history of cancer of undetermined origin, atrial fibrillation, hypertension, hyperlipidemia and comes in with severe nausea since chemotherapy on May 28.  At that time, she received Taxol and carboplatin.  In spite of nausea, she states she has been able to eat and drink.  Appetite is been severely diminished, but she has been forcing herself.  She vomited once yesterday.  However, she notes that she is dizzy and lightheaded whenever she stands up.  Her heart rate jumps to over 120 when she stands up.  She has been taking Compazine at home, which normally gives her relief of nausea, but it has not been helping.  She denies fever or chills.  She denies abdominal pain.  She did have one episode of diarrhea.  Past Medical History:  Diagnosis Date  . Atrial fibrillation ([place])    a. echo 4/07: EF 60%  . Atrial tachycardia ([place])    a. s/p EPS 4/09: no inducible SVT - med Tx continued  . Cancer ([place])   . Complication of anesthesia    age 1yo, woke during procedure with GA, paralysed   . Endometrial polyp   . Hyperlipidemia   . Hypertension   . PONV (postoperative nausea and vomiting)   . Rectocele    WITHOUT MENTION OF UTERINE PROLAPSE  . Stroke ([place])   . SVD (spontaneous vaginal delivery)    x 1    Patient Active Problem List   Diagnosis Date Noted  . Cervical cancer ([place]) 07/20/2017  . Anemia due to antineoplastic chemotherapy 07/19/2017  . Hypomagnesemia 06/28/2017  . Syncopal episodes 06/28/2017  . Hypokalemia 06/20/2017  . 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 Physical deconditioning 04/26/2017  . Drug-induced skin rash 04/12/2017  . Night sweats 04/12/2017  .  Port-A-Cath in place 04/04/2017  . Neuropathy associated with cancer ([place]) 04/04/2017  . Goals of care, counseling/discussion 04/04/2017  . Metastatic squamous cell carcinoma involving liver & sacrum with unknown primary site  03/30/2017  . Metastasis to liver of unknown origin ([place]) 03/18/2017  . Cancer of unknown origin ([place]) 03/11/2017  . Metastasis to liver ([place]) 03/03/2017  . Sacral mass 03/02/2017  . Cancer associated pain 03/02/2017  . Mild single current episode of major depressive disorder ([place]) 08/23/2016  . Cerebral thrombosis with cerebral infarction 12/05/2015  . Stroke (cerebrum) ([place]) 12/05/2015  . Obstructive sleep apnea 07/24/2015  . Preoperative evaluation to rule out surgical contraindication 10/04/2011  . Chest pain, unspecified 06/24/2010  . Shortness of breath 06/24/2010  . Essential hypertension 10/20/2008  . RECTOCELE WITHOUT MENTION OF UTERINE PROLAPSE 10/20/2008  . ENDOMETRIAL POLYP 10/20/2008  . AF (paroxysmal atrial fibrillation) ([place]) 10/20/2008    Past Surgical History:  Procedure Laterality Date  . COLONOSCOPY    . HYSTEROSCOPY    . IR FLUORO GUIDE PORT INSERTION RIGHT  03/23/2017  . IR US GUIDE VASC ACCESS RIGHT  03/23/2017  . LEEP    . Pylonidal cystect    . TUBAL LIGATION    . WISDOM TOOTH EXTRACTION       OB History   None      Home Medications    Prior  to Admission medications   Medication Sig Start Date End Date Taking? Authorizing Provider  ALPRAZolam (XANAX) 0.25 MG tablet Take 1 tablet (0.25 mg total) by mouth 3 (three) times daily as needed for sleep. 06/28/17   [name], MD  atorvastatin (LIPITOR) 40 MG tablet Take 1 tablet (40 mg total) by mouth daily at 6 PM. 12/06/15   Dhungel, Nishant, MD  cholecalciferol (VITAMIN D) 1000 units tablet Take 2,000 Units by mouth daily.    [provider]  citalopram (CELEXA) 20 MG tablet Take 20 mg by mouth daily.    [provider]  clonazePAM (KLONOPIN) 0.5 MG tablet Take  0.5 mg by mouth daily. 06/06/17   [provider]  dexamethasone (DECADRON) 4 MG tablet Take 5 tabs at night with food and 5 tabs at 6 am the morning of chemotherapy, every 3 weeks 04/04/17   [name], MD  flecainide (TAMBOCOR) 50 MG tablet Take 1 tablet (50 mg total) by mouth daily. 09/14/16   Evans Lance, MD  gabapentin (NEURONTIN) 300 MG capsule Take 300-600 mg by mouth 2 (two) times daily. 300 mg in the am and 600 mg at night    [provider]  Glucosamine-Chondroitin (GLUCOSAMINE CHONDR COMPLEX PO) Take 2 capsules by mouth daily.     [provider]  HYDROmorphone (DILAUDID) 4 MG tablet Take 1 tablet (4 mg total) by mouth every 6 (six) hours as needed for severe pain. 06/28/17   [name], MD  lidocaine-prilocaine (EMLA) cream Apply 1 application topically as needed. 04/01/17   [name], MD  magnesium oxide (MAG-OX) 400 (241.3 Mg) MG tablet Take 1 tablet (400 mg total) by mouth 2 (two) times daily. 06/28/17   [name], MD  metFORMIN (GLUCOPHAGE) 500 MG tablet Take 250 mg by mouth daily. 05/25/17   [provider]  metoprolol tartrate (LOPRESSOR) 50 MG tablet Take 1 tablet (50 mg total) by mouth daily. 09/14/16   Evans Lance, MD  Omega-3 Fatty Acids (FISH OIL) 1000 MG CAPS Take 2 capsules by mouth daily.      [provider]  ondansetron (ZOFRAN) 8 MG tablet Take 1 tablet (8 mg total) by mouth every 8 (eight) hours as needed for nausea. 04/26/17   [name], MD  polyethylene glycol (MIRALAX / GLYCOLAX) packet Take 17 g by mouth daily. 04/01/17   Shelly Coss, MD  potassium chloride SA (K-DUR,KLOR-CON) 20 MEQ tablet Take 1 tablet (20 mEq total) by mouth 2 (two) times daily. 06/16/17   Davonna Belling, MD  prochlorperazine (COMPAZINE) 10 MG tablet Take 1 tablet (10 mg total) by mouth every 6 (six) hours as needed (Nausea or vomiting). 05/17/17   [name], MD  rivaroxaban (XARELTO) 20 MG TABS tablet Take 1 tablet (20 mg total) by mouth daily.  12/06/15   Dhungel, Flonnie Overman, MD  senna (SENOKOT) 8.6 MG TABS tablet Take 2 tablets (17.2 mg total) by mouth 2 (two) times daily. Patient not taking: Reported on 04/13/2017 04/01/17   Shelly Coss, MD    Family History Family History  Problem Relation Age of Onset  . Hypertension Father   . Diabetes Father   . Stroke Father   . Hypertension Mother   . Stroke Mother   . Cancer Mother        cervical ca  . Cancer Sister 27       uterine ca  . Cancer Maternal Grandmother        lung ca    Social  History Social History   Tobacco Use  . Smoking status: Former Smoker    Packs/day: 0.50    Years: 10.00    Pack years: 5.00    Types: Cigarettes    Last attempt to quit: 02/22/1998    Years since quitting: 19.4  . Smokeless tobacco: Never Used  Substance Use Topics  . Alcohol use: No  . Drug use: No     Allergies   Patient has no known allergies.   Review of Systems Review of Systems  All other systems reviewed and are negative.    Physical Exam Updated Vital Signs BP (!) 162/112 (BP Location: Left Arm)   Pulse (!) 119   Temp 98.7 F (37.1 C) (Oral)   Resp 13   Ht 5\' 7"  (1.702 m)   Wt 117.9 kg (260 lb)   LMP 02/12/2012   SpO2 100%   BMI 40.72 kg/m   Physical Exam  Nursing note and vitals reviewed.  52 year old fermale, resting comfortably and in no acute distress. Vital signs are significant for elevated blood pressure and heart rate. Oxygen saturation is 100%, which is normal. Head is normocephalic and atraumatic. PERRLA, EOMI. Oropharynx is clear. Neck is nontender and supple without adenopathy or JVD. Back is nontender and there is no CVA tenderness. Lungs are clear without rales, wheezes, or rhonchi. Chest is nontender.  Mediport present on the right. Heart has regular rate and rhythm without murmur. Abdomen is soft, flat, nontender without masses or hepatosplenomegaly and peristalsis is hypoactive. Extremities have no cyanosis or edema, full range of  motion is present. Skin is warm and dry without rash. Neurologic: Mental status is normal, cranial nerves are intact, there are no motor or sensory deficits.  ED Treatments / Results  Labs (all labs ordered are listed, but only abnormal results are displayed) Labs Reviewed  BASIC METABOLIC PANEL - Abnormal; Notable for the following components:      Result Value   Potassium 3.0 (*)    Chloride 96 (*)    Glucose, Bld 226 (*)    Calcium 8.1 (*)    All other components within normal limits  CBC WITH DIFFERENTIAL/PLATELET - Abnormal; Notable for the following components:   WBC 3.1 (*)    RBC 3.15 (*)    Hemoglobin 10.1 (*)    HCT 29.7 (*)    RDW 16.4 (*)    Neutro Abs 1.1 (*)    All other components within normal limits  MAGNESIUM - Abnormal; Notable for the following components:   Magnesium 1.1 (*)    All other components within normal limits   Procedures Procedures  Medications Ordered in ED Medications  potassium chloride 10 mEq in 100 mL IVPB (0 mEq Intravenous Stopped 07/26/17 0511)  ondansetron (ZOFRAN) injection 4 mg (4 mg Intravenous Given 07/26/17 0117)  sodium chloride 0.9 % bolus 1,000 mL (0 mLs Intravenous Stopped 07/26/17 0148)  magnesium sulfate IVPB 2 g 50 mL (0 g Intravenous Stopped 07/26/17 0407)  potassium chloride SA (K-DUR,KLOR-CON) CR tablet 40 mEq (40 mEq Oral Given 07/26/17 0251)  sodium chloride 0.9 % bolus 1,000 mL (0 mLs Intravenous Stopped 07/26/17 0609)     Initial Impression / Assessment and Plan / ED Course  I have reviewed the triage vital signs and the nursing notes.  Pertinent lab results that were available during my care of the patient were reviewed by me and considered in my medical decision making (see chart for details).  Nausea postchemotherapy.  Old  records reviewed confirming chemotherapy being given for cancer of unknown origin.  She received Taxol and carboplatin, both of which can cause significant nausea.  She will be given IV fluids, IV  ondansetron, and will check electrolytes.  She did have an ED visit on April 25 at which time BUN and creatinine were normal, but potassium was 2.3.  On May 7, magnesium was 0.7 with potassium 3.1.  On May 28, potassium was up to 3.8, magnesium still low at 1.1.  2:38 AM After above-noted treatment, she is feeling better.  She has tolerated crackers and juice orally.  Labs show significant hypokalemia and hypomagnesemia.  She will be given oral and intravenous potassium, intravenous magnesium.  BUN and creatinine are normal.  Hemoglobin is stable.  Leukopenia is present, but not critical.  6:56 AM Patient is feeling much better.  She had received a second liter of IV fluids as well as potassium and magnesium.  She has a prescription for magnesium at home and she is to start taking it today.  She is given a prescription for K. Dur.  Also, given prescription for metoclopramide to see if that will help her with nausea that is not relieved adequately by Compazine.  Final Clinical Impressions(s) / ED Diagnoses   Final diagnoses:  Non-intractable vomiting with nausea, unspecified vomiting type  Hypokalemia  Hypomagnesemia  Leukopenia due to antineoplastic chemotherapy (HCC)  Normochromic normocytic anemia    ED Discharge Orders        Ordered    potassium chloride SA (K-DUR,KLOR-CON) 20 MEQ tablet  2 times daily     07/26/17 0655    metoCLOPramide (REGLAN) 10 MG tablet  Every 6 hours PRN     09/13/55 5051       Delora Fuel, MD 83/35/82 [phone]

## 2017-07-26 NOTE — ED Triage Notes (Signed)
Pt complains of being nauseated and very weak since her last chemo last week

## 2017-07-26 NOTE — Discharge Instructions (Signed)
Start taking the magnesium prescription that you have at home.  You may use metoclopramide for nausea that Compazine is not giving you adequate relief for.

## 2017-07-28 ENCOUNTER — Ambulatory Visit: Payer: 59 | Admitting: Internal Medicine

## 2017-07-28 DIAGNOSIS — G4733 Obstructive sleep apnea (adult) (pediatric): Secondary | ICD-10-CM | POA: Diagnosis not present

## 2017-07-28 DIAGNOSIS — Z7984 Long term (current) use of oral hypoglycemic drugs: Secondary | ICD-10-CM | POA: Diagnosis not present

## 2017-07-28 DIAGNOSIS — C539 Malignant neoplasm of cervix uteri, unspecified: Secondary | ICD-10-CM | POA: Diagnosis not present

## 2017-07-28 DIAGNOSIS — C787 Secondary malignant neoplasm of liver and intrahepatic bile duct: Secondary | ICD-10-CM | POA: Diagnosis not present

## 2017-07-28 DIAGNOSIS — C801 Malignant (primary) neoplasm, unspecified: Secondary | ICD-10-CM | POA: Diagnosis not present

## 2017-07-28 MED FILL — FLECAINIDE ACETATE 50 MG TA: 50 | 90 days supply | Qty: 90 | Fill #3

## 2017-07-28 MED FILL — clonazePAM 0.5 MG TABS: 0.5 | 30 days supply | Qty: 30 | Fill #0

## 2017-07-28 MED FILL — GABAPENTIN 300 MG CAPSULE: 300 | 30 days supply | Qty: 90 | Fill #1

## 2017-07-28 MED FILL — METOPROLOL TARTRATE 50 MG T: 50 | 90 days supply | Qty: 90 | Fill #3

## 2017-08-01 DIAGNOSIS — Z7984 Long term (current) use of oral hypoglycemic drugs: Secondary | ICD-10-CM | POA: Diagnosis not present

## 2017-08-01 DIAGNOSIS — C801 Malignant (primary) neoplasm, unspecified: Secondary | ICD-10-CM | POA: Diagnosis not present

## 2017-08-04 ENCOUNTER — Telehealth: Payer: Self-pay

## 2017-08-04 DIAGNOSIS — C801 Malignant (primary) neoplasm, unspecified: Secondary | ICD-10-CM | POA: Diagnosis not present

## 2017-08-04 DIAGNOSIS — Z7984 Long term (current) use of oral hypoglycemic drugs: Secondary | ICD-10-CM | POA: Diagnosis not present

## 2017-08-04 NOTE — Telephone Encounter (Signed)
She called and left a message to call her.  Called back she is seeing her PCP tomorrow and having blood work. They are drawing a CBC w/ diff and CMP. Given fax number for them to fax results to office. She would like to cancel lab appt Monday once the office has faxed the results.

## 2017-08-05 DIAGNOSIS — E669 Obesity, unspecified: Secondary | ICD-10-CM | POA: Diagnosis not present

## 2017-08-05 DIAGNOSIS — E7800 Pure hypercholesterolemia, unspecified: Secondary | ICD-10-CM | POA: Diagnosis not present

## 2017-08-05 DIAGNOSIS — E1169 Type 2 diabetes mellitus with other specified complication: Secondary | ICD-10-CM | POA: Diagnosis not present

## 2017-08-05 DIAGNOSIS — C801 Malignant (primary) neoplasm, unspecified: Secondary | ICD-10-CM | POA: Diagnosis not present

## 2017-08-05 DIAGNOSIS — I480 Paroxysmal atrial fibrillation: Secondary | ICD-10-CM | POA: Diagnosis not present

## 2017-08-05 DIAGNOSIS — I639 Cerebral infarction, unspecified: Secondary | ICD-10-CM | POA: Diagnosis not present

## 2017-08-05 DIAGNOSIS — Z23 Encounter for immunization: Secondary | ICD-10-CM | POA: Diagnosis not present

## 2017-08-05 DIAGNOSIS — G629 Polyneuropathy, unspecified: Secondary | ICD-10-CM | POA: Diagnosis not present

## 2017-08-05 DIAGNOSIS — F339 Major depressive disorder, recurrent, unspecified: Secondary | ICD-10-CM | POA: Diagnosis not present

## 2017-08-05 DIAGNOSIS — I10 Essential (primary) hypertension: Secondary | ICD-10-CM | POA: Diagnosis not present

## 2017-08-05 NOTE — Telephone Encounter (Signed)
Called her back and told we never got a fax with lab results from today. She said they will fax either today or over the weekend. Ask her to call or send a mychart message Monday to see if we received the labs before we cancel her appt for labs on Monday. She verbalized understanding.

## 2017-08-08 ENCOUNTER — Ambulatory Visit (HOSPITAL_COMMUNITY)
Admission: RE | Admit: 2017-08-08 | Discharge: 2017-08-08 | Disposition: A | Discharge status: Home / Self Care | Payer: 59 | Source: Ambulatory Visit | Attending: Hematology and Oncology | Admitting: Hematology and Oncology

## 2017-08-08 ENCOUNTER — Inpatient Hospital Stay: Payer: 59

## 2017-08-08 ENCOUNTER — Other Ambulatory Visit: Payer: Self-pay | Admitting: *Deleted

## 2017-08-08 ENCOUNTER — Ambulatory Visit: Payer: 59 | Admitting: Hematology and Oncology

## 2017-08-08 ENCOUNTER — Telehealth: Payer: Self-pay | Admitting: Hematology and Oncology

## 2017-08-08 ENCOUNTER — Encounter (HOSPITAL_COMMUNITY): Payer: Self-pay

## 2017-08-08 DIAGNOSIS — K760 Fatty (change of) liver, not elsewhere classified: Secondary | ICD-10-CM | POA: Diagnosis not present

## 2017-08-08 DIAGNOSIS — C801 Malignant (primary) neoplasm, unspecified: Secondary | ICD-10-CM | POA: Insufficient documentation

## 2017-08-08 DIAGNOSIS — C787 Secondary malignant neoplasm of liver and intrahepatic bile duct: Secondary | ICD-10-CM | POA: Insufficient documentation

## 2017-08-08 DIAGNOSIS — Z7984 Long term (current) use of oral hypoglycemic drugs: Secondary | ICD-10-CM | POA: Diagnosis not present

## 2017-08-08 DIAGNOSIS — R1900 Intra-abdominal and pelvic swelling, mass and lump, unspecified site: Secondary | ICD-10-CM | POA: Insufficient documentation

## 2017-08-08 DIAGNOSIS — C539 Malignant neoplasm of cervix uteri, unspecified: Secondary | ICD-10-CM | POA: Diagnosis not present

## 2017-08-08 DIAGNOSIS — Z5111 Encounter for antineoplastic chemotherapy: Secondary | ICD-10-CM | POA: Diagnosis not present

## 2017-08-08 MED ORDER — IOPAMIDOL (ISOVUE-300) INJECTION 61%
INTRAVENOUS | Status: AC
  Filled 2017-08-08: qty 100

## 2017-08-08 MED ORDER — IOPAMIDOL (ISOVUE-300) INJECTION 61%
100.0000 mL | Freq: Once | INTRAVENOUS | Status: AC | PRN
  Administered 2017-08-08: 100 mL via INTRAVENOUS

## 2017-08-08 NOTE — Telephone Encounter (Signed)
Patient called to cancel °

## 2017-08-09 ENCOUNTER — Inpatient Hospital Stay: Payer: 59 | Attending: Hematology and Oncology

## 2017-08-09 ENCOUNTER — Inpatient Hospital Stay (HOSPITAL_BASED_OUTPATIENT_CLINIC_OR_DEPARTMENT_OTHER): Payer: 59 | Admitting: Hematology and Oncology

## 2017-08-09 ENCOUNTER — Telehealth: Payer: Self-pay | Admitting: Hematology and Oncology

## 2017-08-09 ENCOUNTER — Encounter: Payer: Self-pay | Admitting: Hematology and Oncology

## 2017-08-09 VITALS — BP 128/93 | HR 72 | Temp 98.9°F | Resp 18

## 2017-08-09 DIAGNOSIS — Z9221 Personal history of antineoplastic chemotherapy: Secondary | ICD-10-CM | POA: Insufficient documentation

## 2017-08-09 DIAGNOSIS — E099 Drug or chemical induced diabetes mellitus without complications: Secondary | ICD-10-CM | POA: Insufficient documentation

## 2017-08-09 DIAGNOSIS — C801 Malignant (primary) neoplasm, unspecified: Secondary | ICD-10-CM

## 2017-08-09 DIAGNOSIS — Z7901 Long term (current) use of anticoagulants: Secondary | ICD-10-CM | POA: Insufficient documentation

## 2017-08-09 DIAGNOSIS — G620 Drug-induced polyneuropathy: Secondary | ICD-10-CM | POA: Insufficient documentation

## 2017-08-09 DIAGNOSIS — C787 Secondary malignant neoplasm of liver and intrahepatic bile duct: Secondary | ICD-10-CM

## 2017-08-09 DIAGNOSIS — Z79899 Other long term (current) drug therapy: Secondary | ICD-10-CM | POA: Diagnosis not present

## 2017-08-09 DIAGNOSIS — Z7984 Long term (current) use of oral hypoglycemic drugs: Secondary | ICD-10-CM | POA: Insufficient documentation

## 2017-08-09 DIAGNOSIS — I480 Paroxysmal atrial fibrillation: Secondary | ICD-10-CM | POA: Diagnosis not present

## 2017-08-09 DIAGNOSIS — K760 Fatty (change of) liver, not elsewhere classified: Secondary | ICD-10-CM | POA: Diagnosis not present

## 2017-08-09 DIAGNOSIS — Z5112 Encounter for antineoplastic immunotherapy: Secondary | ICD-10-CM | POA: Insufficient documentation

## 2017-08-09 DIAGNOSIS — G893 Neoplasm related pain (acute) (chronic): Secondary | ICD-10-CM | POA: Insufficient documentation

## 2017-08-09 DIAGNOSIS — C539 Malignant neoplasm of cervix uteri, unspecified: Secondary | ICD-10-CM

## 2017-08-09 DIAGNOSIS — G63 Polyneuropathy in diseases classified elsewhere: Secondary | ICD-10-CM

## 2017-08-09 DIAGNOSIS — T380X5A Adverse effect of glucocorticoids and synthetic analogues, initial encounter: Secondary | ICD-10-CM

## 2017-08-09 MED ORDER — SODIUM CHLORIDE 0.9% FLUSH
10.0000 mL | INTRAVENOUS | Status: DC | PRN
  Administered 2017-08-09: 10 mL
  Filled 2017-08-09: qty 10

## 2017-08-09 MED ORDER — SODIUM CHLORIDE 0.9 % IV SOLN
Freq: Once | INTRAVENOUS | Status: AC
  Administered 2017-08-09: 11:00:00 via INTRAVENOUS

## 2017-08-09 MED ORDER — ALPRAZOLAM 0.25 MG PO TABS: 0.2500 mg | ORAL_TABLET | Freq: Three times a day (TID) | ORAL | 0 refills | Status: DC | PRN

## 2017-08-09 MED ORDER — HEPARIN SOD (PORK) LOCK FLUSH 100 UNIT/ML IV SOLN
500.0000 [IU] | Freq: Once | INTRAVENOUS | Status: AC | PRN
  Administered 2017-08-09: 500 [IU]
  Filled 2017-08-09: qty 5

## 2017-08-09 MED ORDER — HYDROMORPHONE HCL 4 MG PO TABS: 4.0000 mg | ORAL_TABLET | Freq: Four times a day (QID) | ORAL | 0 refills | Status: DC | PRN

## 2017-08-09 MED ORDER — SODIUM CHLORIDE 0.9 % IV SOLN
15.0000 mg/kg | Freq: Once | INTRAVENOUS | Status: AC
  Administered 2017-08-09: 1800 mg via INTRAVENOUS
  Filled 2017-08-09: qty 64

## 2017-08-09 MED FILL — ALPRAZolam 0.25 MG TABS: 0.25 | 20 days supply | Qty: 60 | Fill #0

## 2017-08-09 MED FILL — HYDROmorphone HCL 4 MG TABS: 4 | 15 days supply | Qty: 60 | Fill #0

## 2017-08-09 NOTE — Patient Instructions (Signed)
[facility] Discharge Instructions for Patients Receiving Chemotherapy  Today you received the following chemotherapy agents :  Avastin.  To help prevent nausea and vomiting after your treatment, we encourage you to take your nausea medication as prescribed.   If you develop nausea and vomiting that is not controlled by your nausea medication, call the clinic.   BELOW ARE SYMPTOMS THAT SHOULD BE REPORTED IMMEDIATELY:  *FEVER GREATER THAN 100.5 F  *CHILLS WITH OR WITHOUT FEVER  NAUSEA AND VOMITING THAT IS NOT CONTROLLED WITH YOUR NAUSEA MEDICATION  *UNUSUAL SHORTNESS OF BREATH  *UNUSUAL BRUISING OR BLEEDING  TENDERNESS IN MOUTH AND THROAT WITH OR WITHOUT PRESENCE OF ULCERS  *URINARY PROBLEMS  *BOWEL PROBLEMS  UNUSUAL RASH Items with * indicate a potential emergency and should be followed up as soon as possible.  Feel free to call the clinic should you have any questions or concerns. The clinic phone number is [phone].  Please show the [facility] at check-in to the Emergency Department and triage nurse.   Bevacizumab injection What is this medicine? BEVACIZUMAB (be va SIZ yoo mab) is a monoclonal antibody. It is used to treat many types of cancer. This medicine may be used for other purposes; ask your health care provider or pharmacist if you have questions. COMMON BRAND NAME(S): Avastin What should I tell my health care provider before I take this medicine? They need to know if you have any of these conditions: -diabetes -heart disease -high blood pressure -history of coughing up blood -prior anthracycline chemotherapy (e.g., doxorubicin, daunorubicin, epirubicin) -recent or ongoing radiation therapy -recent or planning to have surgery -stroke -an unusual or allergic reaction to bevacizumab, hamster proteins, mouse proteins, other medicines, foods, dyes, or preservatives -pregnant or trying to get pregnant -breast-feeding How should I use  this medicine? This medicine is for infusion into a vein. It is given by a health care professional in a hospital or clinic setting. Talk to your pediatrician regarding the use of this medicine in children. Special care may be needed. Overdosage: If you think you have taken too much of this medicine contact a poison control center or emergency room at once. NOTE: This medicine is only for you. Do not share this medicine with others. What if I miss a dose? It is important not to miss your dose. Call your doctor or health care professional if you are unable to keep an appointment. What may interact with this medicine? Interactions are not expected. This list may not describe all possible interactions. Give your health care provider a list of all the medicines, herbs, non-prescription drugs, or dietary supplements you use. Also tell them if you smoke, drink alcohol, or use illegal drugs. Some items may interact with your medicine. What should I watch for while using this medicine? Your condition will be monitored carefully while you are receiving this medicine. You will need important blood work and urine testing done while you are taking this medicine. This medicine may increase your risk to bruise or bleed. Call your doctor or health care professional if you notice any unusual bleeding. This medicine should be started at least 28 days following major surgery and the site of the surgery should be totally healed. Check with your doctor before scheduling dental work or surgery while you are receiving this treatment. Talk to your doctor if you have recently had surgery or if you have a wound that has not healed. Do not become pregnant while taking this medicine or  for 6 months after stopping it. Women should inform their doctor if they wish to become pregnant or think they might be pregnant. There is a potential for serious side effects to an unborn child. Talk to your health care professional or pharmacist  for more information. Do not breast-feed an infant while taking this medicine and for 6 months after the last dose. This medicine has caused ovarian failure in some women. This medicine may interfere with the ability to have a child. You should talk to your doctor or health care professional if you are concerned about your fertility. What side effects may I notice from receiving this medicine? Side effects that you should report to your doctor or health care professional as soon as possible: -allergic reactions like skin rash, itching or hives, swelling of the face, lips, or tongue -chest pain or chest tightness -chills -coughing up blood -high fever -seizures -severe constipation -signs and symptoms of bleeding such as bloody or black, tarry stools; red or dark-brown urine; spitting up blood or brown material that looks like coffee grounds; red spots on the skin; unusual bruising or bleeding from the eye, gums, or nose -signs and symptoms of a blood clot such as breathing problems; chest pain; severe, sudden headache; pain, swelling, warmth in the leg -signs and symptoms of a stroke like changes in vision; confusion; trouble speaking or understanding; severe headaches; sudden numbness or weakness of the face, arm or leg; trouble walking; dizziness; loss of balance or coordination -stomach pain -sweating -swelling of legs or ankles -vomiting -weight gain Side effects that usually do not require medical attention (report to your doctor or health care professional if they continue or are bothersome): -back pain -changes in taste -decreased appetite -dry skin -nausea -tiredness This list may not describe all possible side effects. Call your doctor for medical advice about side effects. You may report side effects to FDA at 1-800-FDA-1088. Where should I keep my medicine? This drug is given in a hospital or clinic and will not be stored at home. NOTE: This sheet is a summary. It may not  cover all possible information. If you have questions about this medicine, talk to your doctor, pharmacist, or health care provider.  2018 Elsevier/Gold Standard (2016-02-06 14:33:29)

## 2017-08-09 NOTE — Telephone Encounter (Signed)
Patient decline avs and calendar °

## 2017-08-09 NOTE — Assessment & Plan Note (Signed)
We reviewed the imaging study Her liver enzymes are stable Liver metastasis are regressing We will continue close observation

## 2017-08-09 NOTE — Assessment & Plan Note (Addendum)
She has completed 6 cycles of chemotherapy recently CT scan showed excellent response to treatment We discussed the role of maintenance therapy with bevacizumab The risk, benefits, side effects of treatment is discussed with the patient, including the risk of bleeding, thrombosis, hypertension, proteinuria and others and she agreed to proceed I recommend treatment every 3 weeks and I plan to repeat imaging study again in 3 months

## 2017-08-09 NOTE — Assessment & Plan Note (Signed)
She has persistent cancer associated pain Her current prescription pain medicine is helpful She will continue the same regimen for now I refilled her prescription today with plan to wean her off pain medicine in the future

## 2017-08-09 NOTE — Assessment & Plan Note (Signed)
Her blood sugar recently was elevated She will continue dietary modification and management with her primary care doctor

## 2017-08-09 NOTE — Assessment & Plan Note (Signed)
She has multifactorial peripheral neuropathy She will continue gabapentin for now

## 2017-08-09 NOTE — Assessment & Plan Note (Signed)
She will continue IV magnesium for a while She is reminded to take oral magnesium oxide I plan to repeat magnesium in her next visit

## 2017-08-09 NOTE — Progress Notes (Signed)
Dr Alvy Bimler reviewed labs from PCP. Does not need repeat labs today prior to Avastin. BP today was 137/101, has been normal at home and at PCP- OK to proceed with treatment today

## 2017-08-09 NOTE — Progress Notes (Signed)
[facility] OFFICE PROGRESS NOTE  Patient Care Team: Lennie Odor, PA-C as PCP - General (Nurse Practitioner) Arvella Nigh, MD as Obstetrician (Obstetrics and Gynecology) Evans Lance, MD as Consulting Physician (Cardiology)  ASSESSMENT & PLAN:  Cancer of unknown origin [facility]) She has completed 6 cycles of chemotherapy recently CT scan showed excellent response to treatment We discussed the role of maintenance therapy with bevacizumab The risk, benefits, side effects of treatment is discussed with the patient, including the risk of bleeding, thrombosis, hypertension, proteinuria and others and she agreed to proceed I recommend treatment every 3 weeks and I plan to repeat imaging study again in 3 months  Metastasis to liver [facility]) We reviewed the imaging study Her liver enzymes are stable Liver metastasis are regressing We will continue close observation  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([place]) Her blood sugar recently was elevated She will continue dietary modification and management with her primary care doctor  Hypomagnesemia She will continue IV magnesium for a while She is reminded to take oral magnesium oxide I plan to repeat magnesium in her next visit  Cancer associated pain She has persistent cancer associated pain Her current prescription pain medicine is helpful She will continue the same regimen for now I refilled her prescription today with plan to wean her off pain medicine in the future  Neuropathy associated with cancer [facility]) She has multifactorial peripheral neuropathy She will continue gabapentin for now  AF (paroxysmal atrial fibrillation) ([place]) The patient had history of paroxysmal atrial fibrillation She is on chronic anticoagulation therapy   No orders of the defined types were placed in this encounter.   INTERVAL HISTORY: Please see below for problem oriented charting. She returns with her family members for further follow-up She  continues to have chronic pain and neuropathy especially on the left foot but they are stable She denies recent falls She denies recent nausea, changes in bowel habits or abdominal bloating No recent infection  SUMMARY OF ONCOLOGIC HISTORY: Oncology History   Biothernostics: 96% probability cervical cancer  Additional evaluation revealed PTEN deletion, negative for MET amplification, PD-L1 expression of 60     Metastasis to liver ([place])   03/03/2017 Initial Diagnosis    Metastasis to liver ([place])      08/08/2017 -  Chemotherapy    The patient had bevacizumab (AVASTIN) 1,800 mg in sodium chloride 0.9 % 100 mL chemo infusion, 15 mg/kg = 1,800 mg, Intravenous,  Once, 1 of 4 cycles  for chemotherapy treatment.        Cancer of unknown origin ([place])   03/03/2017 Imaging    1. Multifocal liver metastasis. 2. Left posterior pelvic mass extending into the obturator foramen measures up to 7 cm. Suspicious for malignancy. This should be easily amendable to percutaneous tissue sampling under image guidance. 3. No lytic bone lesions identified of myeloma.      03/09/2017 Procedure    Technically successful ultrasound-guided core liver lesion biopsy      03/09/2017 Pathology Results    A. LIVER LESION, RIGHT LOBE; ULTRASOUND-GUIDED BIOPSY:  - METASTATIC SQUAMOUS CELL CARCINOMA, SEE COMMENT.   Comment: Metastatic carcinoma is immunoreactive for p40, p16, p53 and pancytokeratin while CK7, CK20, and TTF1 are negative. Significance of p16 positivity depends on the site of origin and the pattern of immunoreactivity in this material is non-specific. Possible sites of origin include gynecologic, anal, lung, and head/neck. Correlation with physical exam and additional imaging is required.       03/18/2017 Pathology Results  She had GYN exam and pap smear. Result is negative for malignancy      03/21/2017 - 04/01/2017 Hospital Admission    She was admitted to the hospital for pain management and had  radiation therapy      03/23/2017 Procedure    Ultrasound and fluoroscopically guided right internal jugular single lumen power port catheter insertion. Tip in the SVC/RA junction. Catheter ready for use.      05/16/2017 Imaging    Mild decrease in size of left posterior pelvic soft tissue mass and liver metastases. No new or progressive metastatic disease identified.  Stable hepatic steatosis and tiny nonobstructing right renal calculus.      08/08/2017 Imaging    Decreased liver metastases.  Decreased size of left internal iliac mass or lymphadenopathy, which abuts the left sacrum.  No new or progressive metastatic disease.  Stable severe hepatic steatosis.      08/08/2017 -  Chemotherapy    The patient had bevacizumab (AVASTIN) 1,800 mg in sodium chloride 0.9 % 100 mL chemo infusion, 15 mg/kg = 1,800 mg, Intravenous,  Once, 1 of 4 cycles  for chemotherapy treatment.        Cervical cancer ([place])   07/20/2017 Initial Diagnosis    Cervical cancer ([place])      08/08/2017 -  Chemotherapy    The patient had bevacizumab (AVASTIN) 1,800 mg in sodium chloride 0.9 % 100 mL chemo infusion, 15 mg/kg = 1,800 mg, Intravenous,  Once, 1 of 4 cycles  for chemotherapy treatment.        REVIEW OF SYSTEMS:   Constitutional: Denies fevers, chills or abnormal weight loss Eyes: Denies blurriness of vision Ears, nose, mouth, throat, and face: Denies mucositis or sore throat Respiratory: Denies cough, dyspnea or wheezes Cardiovascular: Denies palpitation, chest discomfort or lower extremity swelling Gastrointestinal:  Denies nausea, heartburn or change in bowel habits Skin: Denies abnormal skin rashes Lymphatics: Denies new lymphadenopathy or easy bruising Neurological:Denies numbness, tingling or new weaknesses Behavioral/Psych: Mood is stable, no new changes  All other systems were reviewed with the patient and are negative.  I have reviewed the past medical history, past surgical  history, social history and family history with the patient and they are unchanged from previous note.  ALLERGIES:  has No Known Allergies.  MEDICATIONS:  Current Outpatient Medications  Medication Sig Dispense Refill  . ALPRAZolam (XANAX) 0.25 MG tablet Take 1 tablet (0.25 mg total) by mouth 3 (three) times daily as needed for sleep. 60 tablet 0  . atorvastatin (LIPITOR) 40 MG tablet Take 1 tablet (40 mg total) by mouth daily at 6 PM. 30 tablet 0  . cholecalciferol (VITAMIN D) 1000 units tablet Take 2,000 Units by mouth daily.    . citalopram (CELEXA) 20 MG tablet Take 20 mg by mouth daily.    . clonazePAM (KLONOPIN) 0.5 MG tablet Take 0.5 mg by mouth daily.  0  . flecainide (TAMBOCOR) 50 MG tablet Take 1 tablet (50 mg total) by mouth daily. 90 tablet 3  . gabapentin (NEURONTIN) 300 MG capsule Take 300-600 mg by mouth 2 (two) times daily. 300 mg in the am and 600 mg at night    . Glucosamine-Chondroitin (GLUCOSAMINE CHONDR COMPLEX PO) Take 2 capsules by mouth daily.     Marland Kitchen HYDROmorphone (DILAUDID) 4 MG tablet Take 1 tablet (4 mg total) by mouth every 6 (six) hours as needed for severe pain. 60 tablet 0  . lidocaine-prilocaine (EMLA) cream Apply 1 application topically as needed. (Patient  taking differently: Apply 1 application topically as needed (port access). ) 30 g 0  . magnesium oxide (MAG-OX) 400 (241.3 Mg) MG tablet Take 1 tablet (400 mg total) by mouth 2 (two) times daily. 60 tablet 3  . metFORMIN (GLUCOPHAGE) 500 MG tablet Take 250 mg by mouth 2 (two) times daily.   3  . metoCLOPramide (REGLAN) 10 MG tablet Take 1 tablet (10 mg total) by mouth every 6 (six) hours as needed for nausea (or headache). 30 tablet 0  . metoprolol tartrate (LOPRESSOR) 50 MG tablet Take 1 tablet (50 mg total) by mouth daily. 90 tablet 3  . Omega-3 Fatty Acids (FISH OIL) 1000 MG CAPS Take 2 capsules by mouth daily.      . ondansetron (ZOFRAN) 8 MG tablet Take 1 tablet (8 mg total) by mouth every 8 (eight) hours  as needed for nausea. 90 tablet 1  . polyethylene glycol (MIRALAX / GLYCOLAX) packet Take 17 g by mouth daily. 14 each 0  . rivaroxaban (XARELTO) 20 MG TABS tablet Take 1 tablet (20 mg total) by mouth daily. 30 tablet 0   No current facility-administered medications for this visit.    Facility-Administered Medications Ordered in Other Visits  Medication Dose Route Frequency Provider Last Rate Last Dose  . bevacizumab (AVASTIN) 1,800 mg in sodium chloride 0.9 % 100 mL chemo infusion  15 mg/kg (Treatment Plan Recorded) Intravenous Once Alvy Bimler, Burnadette Baskett, MD      . heparin lock flush 100 unit/mL  500 Units Intracatheter Once PRN Alvy Bimler, Zymier Rodgers, MD      . sodium chloride flush (NS) 0.9 % injection 10 mL  10 mL Intracatheter PRN Alvy Bimler, Lynsay Fesperman, MD        PHYSICAL EXAMINATION: ECOG PERFORMANCE STATUS: 1 - Symptomatic but completely ambulatory  Vitals:   08/09/17 0932  BP: (!) 137/101  Pulse: 78  Resp: 18  Temp: 98.4 F (36.9 C)  SpO2: 99%   Filed Weights   08/09/17 0932  Weight: 264 lb (119.7 kg)    GENERAL:alert, no distress and comfortable SKIN: skin color, texture, turgor are normal, no rashes or significant lesions EYES: normal, Conjunctiva are pink and non-injected, sclera clear OROPHARYNX:no exudate, no erythema and lips, buccal mucosa, and tongue normal  NECK: supple, thyroid normal size, non-tender, without nodularity LYMPH:  no palpable lymphadenopathy in the cervical, axillary or inguinal LUNGS: clear to auscultation and percussion with normal breathing effort HEART: regular rate & rhythm and no murmurs and no lower extremity edema ABDOMEN:abdomen soft, non-tender and normal bowel sounds Musculoskeletal:no cyanosis of digits and no clubbing  NEURO: alert & oriented x 3 with fluent speech, no focal motor/sensory deficits  LABORATORY DATA:  I have reviewed the data as listed    Component Value Date/Time   NA 136 07/26/2017 0117   K 3.0 (L) 07/26/2017 0117   CL 96 (L) 07/26/2017  0117   CO2 25 07/26/2017 0117   GLUCOSE 226 (H) 07/26/2017 0117   BUN 14 07/26/2017 0117   CREATININE 0.65 07/26/2017 0117   CREATININE 0.81 06/28/2017 0851   CALCIUM 8.1 (L) 07/26/2017 0117   PROT 7.2 07/19/2017 0932   ALBUMIN 3.8 07/19/2017 0932   AST 71 (H) 07/19/2017 0932   AST 43 (H) 06/28/2017 0851   ALT 68 (H) 07/19/2017 0932   ALT 55 06/28/2017 0851   ALKPHOS 65 07/19/2017 0932   BILITOT 0.6 07/19/2017 0932   BILITOT 0.6 06/28/2017 0851   GFRNONAA >60 07/26/2017 0117   GFRNONAA >60 06/28/2017 2263  GFRAA >60 07/26/2017 0117   GFRAA >60 06/28/2017 0851    No results found for: SPEP, UPEP  Lab Results  Component Value Date   WBC 3.1 (L) 07/26/2017   NEUTROABS 1.1 (L) 07/26/2017   HGB 10.1 (L) 07/26/2017   HCT 29.7 (L) 07/26/2017   MCV 94.3 07/26/2017   PLT 191 07/26/2017      Chemistry      Component Value Date/Time   NA 136 07/26/2017 0117   K 3.0 (L) 07/26/2017 0117   CL 96 (L) 07/26/2017 0117   CO2 25 07/26/2017 0117   BUN 14 07/26/2017 0117   CREATININE 0.65 07/26/2017 0117   CREATININE 0.81 06/28/2017 0851      Component Value Date/Time   CALCIUM 8.1 (L) 07/26/2017 0117   ALKPHOS 65 07/19/2017 0932   AST 71 (H) 07/19/2017 0932   AST 43 (H) 06/28/2017 0851   ALT 68 (H) 07/19/2017 0932   ALT 55 06/28/2017 0851   BILITOT 0.6 07/19/2017 0932   BILITOT 0.6 06/28/2017 0851       RADIOGRAPHIC STUDIES: I have reviewed multiple imaging study with the patient and family I have personally reviewed the radiological images as listed and agreed with the findings in the report. Ct Abdomen Pelvis W Contrast  Result Date: 08/08/2017 CLINICAL DATA:  Followup metastatic cervical carcinoma. Undergoing chemotherapy. EXAM: CT ABDOMEN AND PELVIS WITH CONTRAST TECHNIQUE: Multidetector CT imaging of the abdomen and pelvis was performed using the standard protocol following bolus administration of intravenous contrast. CONTRAST:  178m ISOVUE-300 IOPAMIDOL (ISOVUE-300)  INJECTION 61% COMPARISON:  05/16/2017 FINDINGS: Lower Chest: No acute findings. Hepatobiliary: Severe hepatic steatosis again demonstrated. Hepatic metastases have decreased in size, largest in the anterior right hepatic lobe measuring 8.0 x 3.4 cm on image 24/2, compared with 9.7 x 3.9 cm previously. No new or enlarging liver masses identified. Gallbladder is unremarkable. Pancreas:  No mass or inflammatory changes. Spleen: Within normal limits in size and appearance. Adrenals/Urinary Tract: No masses identified. No evidence of hydronephrosis. Unremarkable unopacified urinary bladder. Stomach/Bowel: No evidence of obstruction, inflammatory process or abnormal fluid collections. Vascular/Lymphatic: No pathologically enlarged lymph nodes. No abdominal aortic aneurysm. Reproductive: Normal size uterus. Mass or lymphadenopathy in the left internal iliac region has decreased in size, currently measuring 4.8 x 2.9 cm compared to 6.1 x 4.5 cm previously. This mass abuts the left sacrum. No new or enlarging masses identified. No evidence of inflammatory process or abnormal fluid collections. Other:  None. Musculoskeletal:  No suspicious bone lesions identified. IMPRESSION: Decreased liver metastases. Decreased size of left internal iliac mass or lymphadenopathy, which abuts the left sacrum. No new or progressive metastatic disease. Stable severe hepatic steatosis. Electronically Signed   By: JEarle GellM.D.   On: 08/08/2017 14:40    All questions were answered. The patient knows to call the clinic with any problems, questions or concerns. No barriers to learning was detected.  I spent 30 minutes counseling the patient face to face. The total time spent in the appointment was 40 minutes and more than 50% was on counseling and review of test results  NHeath Lark MD 08/09/2017 10:57 AM

## 2017-08-09 NOTE — Assessment & Plan Note (Signed)
The patient had history of paroxysmal atrial fibrillation She is on chronic anticoagulation therapy

## 2017-08-10 ENCOUNTER — Telehealth: Payer: Self-pay | Admitting: *Deleted

## 2017-08-10 NOTE — Telephone Encounter (Signed)
-----   Message from Jarvis Morgan, RN sent at 08/09/2017  4:51 PM EDT ----- Regarding: Chemo follow up First time Avastin,  Dr. Cletus Gash,  TB, RN.

## 2017-08-10 NOTE — Telephone Encounter (Signed)
Called patient for chemo follow up. States she did well. Eating and drinking without problems. No questions or concerns.

## 2017-08-11 DIAGNOSIS — C539 Malignant neoplasm of cervix uteri, unspecified: Secondary | ICD-10-CM | POA: Diagnosis not present

## 2017-08-11 DIAGNOSIS — C787 Secondary malignant neoplasm of liver and intrahepatic bile duct: Secondary | ICD-10-CM | POA: Diagnosis not present

## 2017-08-11 DIAGNOSIS — G4733 Obstructive sleep apnea (adult) (pediatric): Secondary | ICD-10-CM | POA: Diagnosis not present

## 2017-08-11 DIAGNOSIS — Z7984 Long term (current) use of oral hypoglycemic drugs: Secondary | ICD-10-CM | POA: Diagnosis not present

## 2017-08-11 DIAGNOSIS — C801 Malignant (primary) neoplasm, unspecified: Secondary | ICD-10-CM | POA: Diagnosis not present

## 2017-08-18 ENCOUNTER — Ambulatory Visit: Payer: 59 | Admitting: Neurology

## 2017-08-19 DIAGNOSIS — G4733 Obstructive sleep apnea (adult) (pediatric): Secondary | ICD-10-CM | POA: Diagnosis not present

## 2017-08-26 ENCOUNTER — Telehealth: Payer: Self-pay | Admitting: Hematology and Oncology

## 2017-08-26 NOTE — Telephone Encounter (Signed)
Spoke with patient regarding moving lab/flush appt.  Confirmed 7/11 appt as well.

## 2017-08-29 ENCOUNTER — Inpatient Hospital Stay: Payer: 59

## 2017-08-29 ENCOUNTER — Inpatient Hospital Stay: Payer: 59 | Attending: Hematology and Oncology

## 2017-08-29 ENCOUNTER — Telehealth: Payer: Self-pay | Admitting: *Deleted

## 2017-08-29 DIAGNOSIS — Z95828 Presence of other vascular implants and grafts: Secondary | ICD-10-CM

## 2017-08-29 DIAGNOSIS — Z923 Personal history of irradiation: Secondary | ICD-10-CM | POA: Insufficient documentation

## 2017-08-29 DIAGNOSIS — D6481 Anemia due to antineoplastic chemotherapy: Secondary | ICD-10-CM | POA: Insufficient documentation

## 2017-08-29 DIAGNOSIS — C801 Malignant (primary) neoplasm, unspecified: Secondary | ICD-10-CM | POA: Diagnosis not present

## 2017-08-29 DIAGNOSIS — C787 Secondary malignant neoplasm of liver and intrahepatic bile duct: Secondary | ICD-10-CM | POA: Insufficient documentation

## 2017-08-29 DIAGNOSIS — G620 Drug-induced polyneuropathy: Secondary | ICD-10-CM | POA: Diagnosis not present

## 2017-08-29 DIAGNOSIS — Z79899 Other long term (current) drug therapy: Secondary | ICD-10-CM | POA: Diagnosis not present

## 2017-08-29 DIAGNOSIS — Z7984 Long term (current) use of oral hypoglycemic drugs: Secondary | ICD-10-CM | POA: Diagnosis not present

## 2017-08-29 DIAGNOSIS — K760 Fatty (change of) liver, not elsewhere classified: Secondary | ICD-10-CM | POA: Diagnosis not present

## 2017-08-29 DIAGNOSIS — G893 Neoplasm related pain (acute) (chronic): Secondary | ICD-10-CM | POA: Insufficient documentation

## 2017-08-29 DIAGNOSIS — I10 Essential (primary) hypertension: Secondary | ICD-10-CM | POA: Diagnosis not present

## 2017-08-29 DIAGNOSIS — E876 Hypokalemia: Secondary | ICD-10-CM

## 2017-08-29 DIAGNOSIS — R1011 Right upper quadrant pain: Secondary | ICD-10-CM | POA: Diagnosis not present

## 2017-08-29 DIAGNOSIS — E099 Drug or chemical induced diabetes mellitus without complications: Secondary | ICD-10-CM | POA: Insufficient documentation

## 2017-08-29 DIAGNOSIS — Z452 Encounter for adjustment and management of vascular access device: Secondary | ICD-10-CM | POA: Insufficient documentation

## 2017-08-29 DIAGNOSIS — Z7901 Long term (current) use of anticoagulants: Secondary | ICD-10-CM | POA: Diagnosis not present

## 2017-08-29 DIAGNOSIS — Z5112 Encounter for antineoplastic immunotherapy: Secondary | ICD-10-CM | POA: Diagnosis not present

## 2017-08-29 DIAGNOSIS — C539 Malignant neoplasm of cervix uteri, unspecified: Secondary | ICD-10-CM

## 2017-08-29 LAB — COMPREHENSIVE METABOLIC PANEL
ALT: 40 U/L (ref 0–44)
AST: 30 U/L (ref 15–41)
Albumin: 3.7 g/dL (ref 3.5–5.0)
Alkaline Phosphatase: 97 U/L (ref 38–126)
Anion gap: 11 (ref 5–15)
BUN: 11 mg/dL (ref 6–20)
CHLORIDE: 98 mmol/L (ref 98–111)
CO2: 29 mmol/L (ref 22–32)
CREATININE: 0.75 mg/dL (ref 0.44–1.00)
Calcium: 9.5 mg/dL (ref 8.9–10.3)
Glucose, Bld: 132 mg/dL — ABNORMAL HIGH (ref 70–99)
Potassium: 3.8 mmol/L (ref 3.5–5.1)
SODIUM: 138 mmol/L (ref 135–145)
TOTAL PROTEIN: 7.9 g/dL (ref 6.5–8.1)
Total Bilirubin: 0.6 mg/dL (ref 0.3–1.2)

## 2017-08-29 LAB — CBC WITH DIFFERENTIAL/PLATELET
BASOS ABS: 0 10*3/uL (ref 0.0–0.1)
BASOS PCT: 0 %
EOS ABS: 0.1 10*3/uL (ref 0.0–0.5)
Eosinophils Relative: 2 %
HCT: 31.7 % — ABNORMAL LOW (ref 34.8–46.6)
HEMOGLOBIN: 10.4 g/dL — AB (ref 11.6–15.9)
LYMPHS ABS: 1.3 10*3/uL (ref 0.9–3.3)
Lymphocytes Relative: 29 %
MCH: 31.4 pg (ref 25.1–34.0)
MCHC: 32.8 g/dL (ref 31.5–36.0)
MCV: 95.8 fL (ref 79.5–101.0)
Monocytes Absolute: 0.5 10*3/uL (ref 0.1–0.9)
Monocytes Relative: 11 %
NEUTROS PCT: 58 %
Neutro Abs: 2.6 10*3/uL (ref 1.5–6.5)
Platelets: 212 10*3/uL (ref 145–400)
RBC: 3.31 MIL/uL — AB (ref 3.70–5.45)
RDW: 15 % — ABNORMAL HIGH (ref 11.2–14.5)
WBC: 4.6 10*3/uL (ref 3.9–10.3)

## 2017-08-29 LAB — TOTAL PROTEIN, URINE DIPSTICK: PROTEIN: 30 mg/dL — AB

## 2017-08-29 LAB — MAGNESIUM: Magnesium: 1.2 mg/dL — CL (ref 1.7–2.4)

## 2017-08-29 MED ORDER — HEPARIN SOD (PORK) LOCK FLUSH 100 UNIT/ML IV SOLN
500.0000 [IU] | Freq: Once | INTRAVENOUS | Status: AC
  Administered 2017-08-29: 500 [IU]
  Filled 2017-08-29: qty 5

## 2017-08-29 MED ORDER — SODIUM CHLORIDE 0.9% FLUSH
10.0000 mL | Freq: Once | INTRAVENOUS | Status: AC
  Administered 2017-08-29: 10 mL
  Filled 2017-08-29: qty 10

## 2017-08-29 MED FILL — XARELTO 20 MG TABLET: 20 | 90 days supply | Qty: 90 | Fill #0

## 2017-08-29 MED FILL — ATORVASTATIN 40 MG TABLET: 40 | 90 days supply | Qty: 90 | Fill #0

## 2017-08-29 MED FILL — GABAPENTIN 300 MG CAPSULE: 300 | 30 days supply | Qty: 90 | Fill #0

## 2017-08-29 NOTE — Telephone Encounter (Signed)
Called patient regarding low magnesium 1.2, pt reports that she has been taking Mag-Ox 400 mg BID. Is OK receiving IV magnesium at home through [facility].

## 2017-08-29 NOTE — Telephone Encounter (Signed)
Instructed to increase Mag-Ox to three times a day.  Called [facility] to give IV Magnesium 6 gm X 1 this week.

## 2017-08-30 MED FILL — clonazePAM 0.5 MG TABS: 0.5 | 30 days supply | Qty: 30 | Fill #0

## 2017-08-31 ENCOUNTER — Other Ambulatory Visit: Payer: 59

## 2017-08-31 DIAGNOSIS — C801 Malignant (primary) neoplasm, unspecified: Secondary | ICD-10-CM | POA: Diagnosis not present

## 2017-08-31 DIAGNOSIS — G4733 Obstructive sleep apnea (adult) (pediatric): Secondary | ICD-10-CM | POA: Diagnosis not present

## 2017-08-31 DIAGNOSIS — C539 Malignant neoplasm of cervix uteri, unspecified: Secondary | ICD-10-CM | POA: Diagnosis not present

## 2017-08-31 DIAGNOSIS — C787 Secondary malignant neoplasm of liver and intrahepatic bile duct: Secondary | ICD-10-CM | POA: Diagnosis not present

## 2017-09-01 ENCOUNTER — Encounter: Payer: Self-pay | Admitting: Hematology and Oncology

## 2017-09-01 ENCOUNTER — Other Ambulatory Visit: Payer: 59

## 2017-09-01 ENCOUNTER — Inpatient Hospital Stay (HOSPITAL_BASED_OUTPATIENT_CLINIC_OR_DEPARTMENT_OTHER): Payer: 59 | Admitting: Hematology and Oncology

## 2017-09-01 ENCOUNTER — Inpatient Hospital Stay: Payer: 59

## 2017-09-01 VITALS — BP 135/81 | HR 63

## 2017-09-01 DIAGNOSIS — K760 Fatty (change of) liver, not elsewhere classified: Secondary | ICD-10-CM

## 2017-09-01 DIAGNOSIS — R5381 Other malaise: Secondary | ICD-10-CM

## 2017-09-01 DIAGNOSIS — G893 Neoplasm related pain (acute) (chronic): Secondary | ICD-10-CM

## 2017-09-01 DIAGNOSIS — G620 Drug-induced polyneuropathy: Secondary | ICD-10-CM | POA: Diagnosis not present

## 2017-09-01 DIAGNOSIS — D6481 Anemia due to antineoplastic chemotherapy: Secondary | ICD-10-CM

## 2017-09-01 DIAGNOSIS — I10 Essential (primary) hypertension: Secondary | ICD-10-CM

## 2017-09-01 DIAGNOSIS — T451X5A Adverse effect of antineoplastic and immunosuppressive drugs, initial encounter: Secondary | ICD-10-CM

## 2017-09-01 DIAGNOSIS — Z7901 Long term (current) use of anticoagulants: Secondary | ICD-10-CM

## 2017-09-01 DIAGNOSIS — C787 Secondary malignant neoplasm of liver and intrahepatic bile duct: Secondary | ICD-10-CM

## 2017-09-01 DIAGNOSIS — C801 Malignant (primary) neoplasm, unspecified: Secondary | ICD-10-CM

## 2017-09-01 DIAGNOSIS — Z5112 Encounter for antineoplastic immunotherapy: Secondary | ICD-10-CM | POA: Diagnosis not present

## 2017-09-01 DIAGNOSIS — Z923 Personal history of irradiation: Secondary | ICD-10-CM

## 2017-09-01 DIAGNOSIS — Z79899 Other long term (current) drug therapy: Secondary | ICD-10-CM

## 2017-09-01 DIAGNOSIS — E099 Drug or chemical induced diabetes mellitus without complications: Secondary | ICD-10-CM

## 2017-09-01 DIAGNOSIS — C539 Malignant neoplasm of cervix uteri, unspecified: Secondary | ICD-10-CM

## 2017-09-01 DIAGNOSIS — R1011 Right upper quadrant pain: Secondary | ICD-10-CM | POA: Diagnosis not present

## 2017-09-01 DIAGNOSIS — Z7984 Long term (current) use of oral hypoglycemic drugs: Secondary | ICD-10-CM

## 2017-09-01 MED ORDER — SODIUM CHLORIDE 0.9 % IV SOLN
15.0000 mg/kg | Freq: Once | INTRAVENOUS | Status: AC
  Administered 2017-09-01: 1800 mg via INTRAVENOUS
  Filled 2017-09-01: qty 64

## 2017-09-01 MED ORDER — SODIUM CHLORIDE 0.9 % IV SOLN
Freq: Once | INTRAVENOUS | Status: AC
  Administered 2017-09-01: 09:00:00 via INTRAVENOUS

## 2017-09-01 MED ORDER — METOPROLOL TARTRATE 50 MG PO TABS: 50.0000 mg | ORAL_TABLET | Freq: Two times a day (BID) | ORAL | 3 refills | Status: DC

## 2017-09-01 MED ORDER — SODIUM CHLORIDE 0.9% FLUSH
10.0000 mL | INTRAVENOUS | Status: DC | PRN
  Administered 2017-09-01: 10 mL
  Filled 2017-09-01: qty 10

## 2017-09-01 MED ORDER — MAGNESIUM OXIDE 400 (241.3 MG) MG PO TABS: 400.0000 mg | ORAL_TABLET | Freq: Three times a day (TID) | ORAL | 3 refills | Status: DC

## 2017-09-01 MED ORDER — HEPARIN SOD (PORK) LOCK FLUSH 100 UNIT/ML IV SOLN
500.0000 [IU] | Freq: Once | INTRAVENOUS | Status: AC | PRN
  Administered 2017-09-01: 500 [IU]
  Filled 2017-09-01: qty 5

## 2017-09-01 MED ORDER — LISINOPRIL 20 MG PO TABS: 20.0000 mg | ORAL_TABLET | Freq: Every day | ORAL | 3 refills | Status: DC

## 2017-09-01 MED FILL — LISINOPRIL 20 MG TABLET: 20 | 90 days supply | Qty: 90 | Fill #0

## 2017-09-01 NOTE — Assessment & Plan Note (Signed)
She has completed 6 cycles of chemotherapy recently CT scan showed excellent response to treatment We discussed the role of maintenance therapy with bevacizumab The risk, benefits, side effects of treatment is discussed with the patient, including the risk of bleeding, thrombosis, hypertension, proteinuria and others and she agreed to proceed I recommend treatment every 3 weeks and I plan to repeat imaging study again in 3 months, due September

## 2017-09-01 NOTE — Assessment & Plan Note (Signed)
She has significant physical deconditioning I recommend cancer rehab and outpatient physical training program I have spent some time with disability paperwork to certify her disabled until the end of the year

## 2017-09-01 NOTE — Assessment & Plan Note (Signed)
She has received intravenous magnesium recently She is instructed to increase oral magnesium to 3 times a day

## 2017-09-01 NOTE — Assessment & Plan Note (Signed)
This is likely due to recent treatment. The patient denies recent history of bleeding such as epistaxis, hematuria or hematochezia. She is asymptomatic from the anemia. I will observe for now.   

## 2017-09-01 NOTE — Assessment & Plan Note (Signed)
She has worsening hypertension likely due to a Avastin I recommend increasing metoprolol to twice a day and to institute lisinopril The patient is instructed to take her blood pressure twice a day with a goal systolic blood pressure less than 353 and diastolic blood pressure less than 90

## 2017-09-01 NOTE — Patient Instructions (Signed)
[facility] Discharge Instructions for Patients Receiving Chemotherapy  Today you received the following chemotherapy agents Avastin  To help prevent nausea and vomiting after your treatment, we encourage you to take your nausea medication as directed   If you develop nausea and vomiting that is not controlled by your nausea medication, call the clinic.   BELOW ARE SYMPTOMS THAT SHOULD BE REPORTED IMMEDIATELY:  *FEVER GREATER THAN 100.5 F  *CHILLS WITH OR WITHOUT FEVER  NAUSEA AND VOMITING THAT IS NOT CONTROLLED WITH YOUR NAUSEA MEDICATION  *UNUSUAL SHORTNESS OF BREATH  *UNUSUAL BRUISING OR BLEEDING  TENDERNESS IN MOUTH AND THROAT WITH OR WITHOUT PRESENCE OF ULCERS  *URINARY PROBLEMS  *BOWEL PROBLEMS  UNUSUAL RASH Items with * indicate a potential emergency and should be followed up as soon as possible.  Feel free to call the clinic should you have any questions or concerns. The clinic phone number is [phone].  Please show the CHEMO ALERT CARD at check-in to the Emergency Department and triage nurse.   

## 2017-09-01 NOTE — Progress Notes (Signed)
[facility] OFFICE PROGRESS NOTE  Patient Care Team: Lennie Odor, PA-C as PCP - General (Nurse Practitioner) Arvella Nigh, MD as Obstetrician (Obstetrics and Gynecology) Evans Lance, MD as Consulting Physician (Cardiology)  ASSESSMENT & PLAN:  Cancer of unknown origin [facility]) She has completed 6 cycles of chemotherapy recently CT scan showed excellent response to treatment We discussed the role of maintenance therapy with bevacizumab The risk, benefits, side effects of treatment is discussed with the patient, including the risk of bleeding, thrombosis, hypertension, proteinuria and others and she agreed to proceed I recommend treatment every 3 weeks and I plan to repeat imaging study again in 3 months, due September  Metastasis to liver [facility]) She has intermittent pain in the right upper quadrant region, relieved by prescription pain medicine.  We will continue to same.  If her pain is unrelieved, we might consider repeat imaging study sooner than September  Essential hypertension She has worsening hypertension likely due to a Avastin I recommend increasing metoprolol to twice a day and to institute lisinopril The patient is instructed to take her blood pressure twice a day with a goal systolic blood pressure less than 233 and diastolic blood pressure less than 90  Anemia due to antineoplastic chemotherapy This is likely due to recent treatment. The patient denies recent history of bleeding such as epistaxis, hematuria or hematochezia. She is asymptomatic from the anemia. I will observe for now.    Hypomagnesemia She has received intravenous magnesium recently She is instructed to increase oral magnesium to 3 times a day  Physical deconditioning She has significant physical deconditioning I recommend cancer rehab and outpatient physical training program I have spent some time with disability paperwork to certify her disabled until the end of the year   No orders  of the defined types were placed in this encounter.   INTERVAL HISTORY: Please see below for problem oriented charting. She returns with her daughter for further follow-up She complained of intermittent right upper quadrant pain that comes and goes She denies leg swelling No recent headache or blurriness of vision She complained of severe peripheral neuropathy. She denies recent changes in bowel habits Her neuropathy pain is severe despite gabapentin and pain medicine.  She has not been mobilizing well because of this She denies abnormal vaginal bleeding  SUMMARY OF ONCOLOGIC HISTORY: Oncology History   Biothernostics: 96% probability cervical cancer  Additional evaluation revealed PTEN deletion, negative for MET amplification, PD-L1 expression of 60     Metastasis to liver ([place])   03/03/2017 Initial Diagnosis    Metastasis to liver ([place])      08/08/2017 -  Chemotherapy    The patient had bevacizumab (AVASTIN) 1,800 mg in sodium chloride 0.9 % 100 mL chemo infusion, 15 mg/kg = 1,800 mg, Intravenous,  Once, 1 of 4 cycles Administration: 1,800 mg (08/09/2017)  for chemotherapy treatment.        Cancer of unknown origin ([place])   03/03/2017 Imaging    1. Multifocal liver metastasis. 2. Left posterior pelvic mass extending into the obturator foramen measures up to 7 cm. Suspicious for malignancy. This should be easily amendable to percutaneous tissue sampling under image guidance. 3. No lytic bone lesions identified of myeloma.      03/09/2017 Procedure    Technically successful ultrasound-guided core liver lesion biopsy      03/09/2017 Pathology Results    A. LIVER LESION, RIGHT LOBE; ULTRASOUND-GUIDED BIOPSY:  - METASTATIC SQUAMOUS CELL CARCINOMA, SEE COMMENT.   Comment:  Metastatic carcinoma is immunoreactive for p40, p16, p53 and pancytokeratin while CK7, CK20, and TTF1 are negative. Significance of p16 positivity depends on the site of origin and the pattern of  immunoreactivity in this material is non-specific. Possible sites of origin include gynecologic, anal, lung, and head/neck. Correlation with physical exam and additional imaging is required.       03/18/2017 Pathology Results    She had GYN exam and pap smear. Result is negative for malignancy      03/21/2017 - 04/01/2017 Hospital Admission    She was admitted to the hospital for pain management and had radiation therapy      03/23/2017 Procedure    Ultrasound and fluoroscopically guided right internal jugular single lumen power port catheter insertion. Tip in the SVC/RA junction. Catheter ready for use.      05/16/2017 Imaging    Mild decrease in size of left posterior pelvic soft tissue mass and liver metastases. No new or progressive metastatic disease identified.  Stable hepatic steatosis and tiny nonobstructing right renal calculus.      08/08/2017 Imaging    Decreased liver metastases.  Decreased size of left internal iliac mass or lymphadenopathy, which abuts the left sacrum.  No new or progressive metastatic disease.  Stable severe hepatic steatosis.      08/08/2017 -  Chemotherapy    The patient had bevacizumab (AVASTIN) 1,800 mg in sodium chloride 0.9 % 100 mL chemo infusion, 15 mg/kg = 1,800 mg, Intravenous,  Once, 1 of 4 cycles Administration: 1,800 mg (08/09/2017)  for chemotherapy treatment.        Cervical cancer ([place])   07/20/2017 Initial Diagnosis    Cervical cancer ([place])      08/08/2017 -  Chemotherapy    The patient had bevacizumab (AVASTIN) 1,800 mg in sodium chloride 0.9 % 100 mL chemo infusion, 15 mg/kg = 1,800 mg, Intravenous,  Once, 1 of 4 cycles Administration: 1,800 mg (08/09/2017)  for chemotherapy treatment.        REVIEW OF SYSTEMS:   Constitutional: Denies fevers, chills or abnormal weight loss Eyes: Denies blurriness of vision Ears, nose, mouth, throat, and face: Denies mucositis or sore throat Respiratory: Denies cough, dyspnea or  wheezes Cardiovascular: Denies palpitation, chest discomfort or lower extremity swelling Gastrointestinal:  Denies nausea, heartburn or change in bowel habits Skin: Denies abnormal skin rashes Lymphatics: Denies new lymphadenopathy or easy bruising Behavioral/Psych: Mood is stable, no new changes  All other systems were reviewed with the patient and are negative.  I have reviewed the past medical history, past surgical history, social history and family history with the patient and they are unchanged from previous note.  ALLERGIES:  has No Known Allergies.  MEDICATIONS:  Current Outpatient Medications  Medication Sig Dispense Refill  . ALPRAZolam (XANAX) 0.25 MG tablet Take 1 tablet (0.25 mg total) by mouth 3 (three) times daily as needed for sleep. 60 tablet 0  . atorvastatin (LIPITOR) 40 MG tablet Take 1 tablet (40 mg total) by mouth daily at 6 PM. 30 tablet 0  . cholecalciferol (VITAMIN D) 1000 units tablet Take 2,000 Units by mouth daily.    . citalopram (CELEXA) 20 MG tablet Take 20 mg by mouth daily.    . clonazePAM (KLONOPIN) 0.5 MG tablet Take 0.5 mg by mouth daily.  0  . flecainide (TAMBOCOR) 50 MG tablet Take 1 tablet (50 mg total) by mouth daily. 90 tablet 3  . gabapentin (NEURONTIN) 300 MG capsule Take 300-600 mg by mouth 2 (  two) times daily. 300 mg in the am and 600 mg at night    . HYDROmorphone (DILAUDID) 4 MG tablet Take 1 tablet (4 mg total) by mouth every 6 (six) hours as needed for severe pain. 60 tablet 0  . lidocaine-prilocaine (EMLA) cream Apply 1 application topically as needed. (Patient taking differently: Apply 1 application topically as needed (port access). ) 30 g 0  . lisinopril (PRINIVIL,ZESTRIL) 20 MG tablet Take 1 tablet (20 mg total) by mouth daily. 90 tablet 3  . magnesium oxide (MAG-OX) 400 (241.3 Mg) MG tablet Take 1 tablet (400 mg total) by mouth 3 (three) times daily. 60 tablet 3  . metFORMIN (GLUCOPHAGE) 500 MG tablet Take 500 mg by mouth 2 (two) times  daily.  3  . metoprolol tartrate (LOPRESSOR) 50 MG tablet Take 1 tablet (50 mg total) by mouth 2 (two) times daily. 90 tablet 3  . Omega-3 Fatty Acids (FISH OIL) 1000 MG CAPS Take 2 capsules by mouth daily.      . ondansetron (ZOFRAN) 8 MG tablet Take 1 tablet (8 mg total) by mouth every 8 (eight) hours as needed for nausea. 90 tablet 1  . polyethylene glycol (MIRALAX / GLYCOLAX) packet Take 17 g by mouth daily. 14 each 0  . rivaroxaban (XARELTO) 20 MG TABS tablet Take 1 tablet (20 mg total) by mouth daily. 30 tablet 0   No current facility-administered medications for this visit.     PHYSICAL EXAMINATION: ECOG PERFORMANCE STATUS: 2 - Symptomatic, <50% confined to bed  Vitals:   09/01/17 0829  BP: (!) 153/92  Pulse: 75  Resp: 18  Temp: 99.1 F (37.3 C)  SpO2: 99%   Filed Weights   09/01/17 0829  Weight: 261 lb 4.8 oz (118.5 kg)    GENERAL:alert, no distress and comfortable SKIN: skin color, texture, turgor are normal, no rashes or significant lesions EYES: normal, Conjunctiva are pink and non-injected, sclera clear OROPHARYNX:no exudate, no erythema and lips, buccal mucosa, and tongue normal  NECK: supple, thyroid normal size, non-tender, without nodularity LYMPH:  no palpable lymphadenopathy in the cervical, axillary or inguinal LUNGS: clear to auscultation and percussion with normal breathing effort HEART: regular rate & rhythm and no murmurs and no lower extremity edema ABDOMEN:abdomen soft, non-tender and normal bowel sounds Musculoskeletal:no cyanosis of digits and no clubbing  NEURO: alert & oriented x 3 with fluent speech, no focal motor/sensory deficits  LABORATORY DATA:  I have reviewed the data as listed    Component Value Date/Time   NA 138 08/29/2017 1227   K 3.8 08/29/2017 1227   CL 98 08/29/2017 1227   CO2 29 08/29/2017 1227   GLUCOSE 132 (H) 08/29/2017 1227   BUN 11 08/29/2017 1227   CREATININE 0.75 08/29/2017 1227   CREATININE 0.81 06/28/2017 0851    CALCIUM 9.5 08/29/2017 1227   PROT 7.9 08/29/2017 1227   ALBUMIN 3.7 08/29/2017 1227   AST 30 08/29/2017 1227   AST 43 (H) 06/28/2017 0851   ALT 40 08/29/2017 1227   ALT 55 06/28/2017 0851   ALKPHOS 97 08/29/2017 1227   BILITOT 0.6 08/29/2017 1227   BILITOT 0.6 06/28/2017 0851   GFRNONAA >60 08/29/2017 1227   GFRNONAA >60 06/28/2017 0851   GFRAA >60 08/29/2017 1227   GFRAA >60 06/28/2017 0851    No results found for: SPEP, UPEP  Lab Results  Component Value Date   WBC 4.6 08/29/2017   NEUTROABS 2.6 08/29/2017   HGB 10.4 (L) 08/29/2017  HCT 31.7 (L) 08/29/2017   MCV 95.8 08/29/2017   PLT 212 08/29/2017      Chemistry      Component Value Date/Time   NA 138 08/29/2017 1227   K 3.8 08/29/2017 1227   CL 98 08/29/2017 1227   CO2 29 08/29/2017 1227   BUN 11 08/29/2017 1227   CREATININE 0.75 08/29/2017 1227   CREATININE 0.81 06/28/2017 0851      Component Value Date/Time   CALCIUM 9.5 08/29/2017 1227   ALKPHOS 97 08/29/2017 1227   AST 30 08/29/2017 1227   AST 43 (H) 06/28/2017 0851   ALT 40 08/29/2017 1227   ALT 55 06/28/2017 0851   BILITOT 0.6 08/29/2017 1227   BILITOT 0.6 06/28/2017 0851       RADIOGRAPHIC STUDIES: I have personally reviewed the radiological images as listed and agreed with the findings in the report. Ct Abdomen Pelvis W Contrast  Result Date: 08/08/2017 CLINICAL DATA:  Followup metastatic cervical carcinoma. Undergoing chemotherapy. EXAM: CT ABDOMEN AND PELVIS WITH CONTRAST TECHNIQUE: Multidetector CT imaging of the abdomen and pelvis was performed using the standard protocol following bolus administration of intravenous contrast. CONTRAST:  142m ISOVUE-300 IOPAMIDOL (ISOVUE-300) INJECTION 61% COMPARISON:  05/16/2017 FINDINGS: Lower Chest: No acute findings. Hepatobiliary: Severe hepatic steatosis again demonstrated. Hepatic metastases have decreased in size, largest in the anterior right hepatic lobe measuring 8.0 x 3.4 cm on image 24/2,  compared with 9.7 x 3.9 cm previously. No new or enlarging liver masses identified. Gallbladder is unremarkable. Pancreas:  No mass or inflammatory changes. Spleen: Within normal limits in size and appearance. Adrenals/Urinary Tract: No masses identified. No evidence of hydronephrosis. Unremarkable unopacified urinary bladder. Stomach/Bowel: No evidence of obstruction, inflammatory process or abnormal fluid collections. Vascular/Lymphatic: No pathologically enlarged lymph nodes. No abdominal aortic aneurysm. Reproductive: Normal size uterus. Mass or lymphadenopathy in the left internal iliac region has decreased in size, currently measuring 4.8 x 2.9 cm compared to 6.1 x 4.5 cm previously. This mass abuts the left sacrum. No new or enlarging masses identified. No evidence of inflammatory process or abnormal fluid collections. Other:  None. Musculoskeletal:  No suspicious bone lesions identified. IMPRESSION: Decreased liver metastases. Decreased size of left internal iliac mass or lymphadenopathy, which abuts the left sacrum. No new or progressive metastatic disease. Stable severe hepatic steatosis. Electronically Signed   By: JEarle GellM.D.   On: 08/08/2017 14:40    All questions were answered. The patient knows to call the clinic with any problems, questions or concerns. No barriers to learning was detected.  I spent 25 minutes counseling the patient face to face. The total time spent in the appointment was 30 minutes and more than 50% was on counseling and review of test results  NHeath Lark MD 09/01/2017 8:50 AM

## 2017-09-01 NOTE — Assessment & Plan Note (Signed)
She has intermittent pain in the right upper quadrant region, relieved by prescription pain medicine.  We will continue to same.  If her pain is unrelieved, we might consider repeat imaging study sooner than September

## 2017-09-05 ENCOUNTER — Telehealth: Payer: Self-pay | Admitting: Hematology and Oncology

## 2017-09-05 ENCOUNTER — Telehealth: Payer: Self-pay

## 2017-09-05 NOTE — Telephone Encounter (Signed)
Glenard Haring, nurse with [facility], called to get verbal order to continue to see pt for med management.  Per Glenard Haring, pt was given IV magnesium "last week" through porta cath.  Port needle was not removed, per Glenard Haring, d/t pt "still getting chemo treatment".  Pt's next treatment is not until 09/22/17.  Order given to flush, heparin lock port and removed needle at this time.

## 2017-09-05 NOTE — Telephone Encounter (Signed)
Printed medical records for [place] on 09/05/17, Release ID: [id number]

## 2017-09-07 NOTE — Progress Notes (Signed)
Disability forms and all medical records from April 22, 2017 successfully faxed to [facility] at [phone]. Mailed copy to patient address on file.

## 2017-09-09 DIAGNOSIS — C801 Malignant (primary) neoplasm, unspecified: Secondary | ICD-10-CM | POA: Diagnosis not present

## 2017-09-15 ENCOUNTER — Emergency Department (HOSPITAL_COMMUNITY): Payer: 59

## 2017-09-15 ENCOUNTER — Other Ambulatory Visit: Payer: Self-pay

## 2017-09-15 ENCOUNTER — Emergency Department (HOSPITAL_COMMUNITY)
Admission: EM | Admit: 2017-09-15 | Discharge: 2017-09-15 | Disposition: A | Discharge status: Home / Self Care | Payer: 59 | Attending: Emergency Medicine | Admitting: Emergency Medicine

## 2017-09-15 ENCOUNTER — Encounter (HOSPITAL_COMMUNITY): Payer: Self-pay | Admitting: Emergency Medicine

## 2017-09-15 ENCOUNTER — Telehealth: Payer: Self-pay

## 2017-09-15 DIAGNOSIS — C539 Malignant neoplasm of cervix uteri, unspecified: Secondary | ICD-10-CM | POA: Diagnosis not present

## 2017-09-15 DIAGNOSIS — R112 Nausea with vomiting, unspecified: Secondary | ICD-10-CM | POA: Insufficient documentation

## 2017-09-15 DIAGNOSIS — Z79899 Other long term (current) drug therapy: Secondary | ICD-10-CM | POA: Diagnosis not present

## 2017-09-15 DIAGNOSIS — R110 Nausea: Secondary | ICD-10-CM

## 2017-09-15 DIAGNOSIS — C801 Malignant (primary) neoplasm, unspecified: Secondary | ICD-10-CM | POA: Diagnosis not present

## 2017-09-15 DIAGNOSIS — K7689 Other specified diseases of liver: Secondary | ICD-10-CM | POA: Diagnosis not present

## 2017-09-15 DIAGNOSIS — R1011 Right upper quadrant pain: Secondary | ICD-10-CM | POA: Diagnosis not present

## 2017-09-15 DIAGNOSIS — Z87891 Personal history of nicotine dependence: Secondary | ICD-10-CM | POA: Insufficient documentation

## 2017-09-15 DIAGNOSIS — E785 Hyperlipidemia, unspecified: Secondary | ICD-10-CM | POA: Diagnosis not present

## 2017-09-15 DIAGNOSIS — C787 Secondary malignant neoplasm of liver and intrahepatic bile duct: Secondary | ICD-10-CM | POA: Insufficient documentation

## 2017-09-15 DIAGNOSIS — Z8673 Personal history of transient ischemic attack (TIA), and cerebral infarction without residual deficits: Secondary | ICD-10-CM | POA: Diagnosis not present

## 2017-09-15 DIAGNOSIS — I10 Essential (primary) hypertension: Secondary | ICD-10-CM | POA: Insufficient documentation

## 2017-09-15 DIAGNOSIS — Z9221 Personal history of antineoplastic chemotherapy: Secondary | ICD-10-CM | POA: Insufficient documentation

## 2017-09-15 LAB — CBC
HEMATOCRIT: 30.1 % — AB (ref 36.0–46.0)
Hemoglobin: 9.7 g/dL — ABNORMAL LOW (ref 12.0–15.0)
MCH: 29.9 pg (ref 26.0–34.0)
MCHC: 32.2 g/dL (ref 30.0–36.0)
MCV: 92.9 fL (ref 78.0–100.0)
Platelets: 127 10*3/uL — ABNORMAL LOW (ref 150–400)
RBC: 3.24 MIL/uL — ABNORMAL LOW (ref 3.87–5.11)
RDW: 14.9 % (ref 11.5–15.5)
WBC: 5.5 10*3/uL (ref 4.0–10.5)

## 2017-09-15 LAB — I-STAT BETA HCG BLOOD, ED (MC, WL, AP ONLY): I-stat hCG, quantitative: <5 m[IU]/mL (ref <5)

## 2017-09-15 LAB — LIPASE, BLOOD: Lipase: 25 U/L (ref 11–51)

## 2017-09-15 LAB — COMPREHENSIVE METABOLIC PANEL
ALBUMIN: 3.1 g/dL — AB (ref 3.5–5.0)
ALT: 25 U/L (ref 0–44)
ANION GAP: 9 (ref 5–15)
AST: 27 U/L (ref 15–41)
Alkaline Phosphatase: 83 U/L (ref 38–126)
BILIRUBIN TOTAL: 0.7 mg/dL (ref 0.3–1.2)
BUN: 8 mg/dL (ref 6–20)
CO2: 29 mmol/L (ref 22–32)
Calcium: 8.9 mg/dL (ref 8.9–10.3)
Chloride: 101 mmol/L (ref 98–111)
Creatinine, Ser: 0.52 mg/dL (ref 0.44–1.00)
GFR calc Af Amer: >60 mL/min (ref >60)
GFR calc non Af Amer: >60 mL/min (ref >60)
GLUCOSE: 97 mg/dL (ref 70–99)
Potassium: 3.6 mmol/L (ref 3.5–5.1)
SODIUM: 139 mmol/L (ref 135–145)
TOTAL PROTEIN: 7.3 g/dL (ref 6.5–8.1)

## 2017-09-15 LAB — MAGNESIUM: MAGNESIUM: 1.6 mg/dL — AB (ref 1.7–2.4)

## 2017-09-15 MED ORDER — PROCHLORPERAZINE EDISYLATE 10 MG/2ML IJ SOLN
10.0000 mg | Freq: Once | INTRAMUSCULAR | Status: AC
  Administered 2017-09-15: 10 mg via INTRAVENOUS
  Filled 2017-09-15: qty 2

## 2017-09-15 MED ORDER — SODIUM CHLORIDE 0.9 % IV BOLUS
500.0000 mL | Freq: Once | INTRAVENOUS | Status: AC
  Administered 2017-09-15: 500 mL via INTRAVENOUS

## 2017-09-15 MED ORDER — HEPARIN SOD (PORK) LOCK FLUSH 100 UNIT/ML IV SOLN
500.0000 [IU] | Freq: Once | INTRAVENOUS | Status: AC
  Administered 2017-09-15: 500 [IU]
  Filled 2017-09-15: qty 5

## 2017-09-15 NOTE — Telephone Encounter (Signed)
She called and left a message to call her.  Called back. She is requesting a ultrasound, she thinks she is having a gallbladder problems. She is having nausea and vomiting 80% of the time. Right sided abdominal pain 80% of the time. Vomiting after eating greasy food.

## 2017-09-15 NOTE — Telephone Encounter (Signed)
Called back per Dr. Alvy Bimler. Instructed to go to ER is she is having symptoms. She is still having symptoms. She said she would not go to the ER today because she is having a lot going on today. She would go to ER tomorrow.

## 2017-09-15 NOTE — ED Notes (Signed)
Pt cannot urinate at the time but will try later. 

## 2017-09-15 NOTE — ED Provider Notes (Signed)
[facility] DEPT Provider Note   CSN: [id number] Arrival date & time: 09/15/17  1657     History   Chief Complaint Chief Complaint  Patient presents with  . Nausea  . Abdominal Pain    HPI Cindy Faulkner is a 52 y.o. female.  HPI   She presents for evaluation of right upper quadrant abdominal pain associated with nausea, and vomiting, for 2 weeks.  She has a prior known gallstone.  She is currently receiving chemotherapy, for a pelvic tumor.  She denies fever, chills, focal weakness or paresthesia.  She has not seen any blood in her emesis.  She has frequent small volume stools which are liquidy.  The diarrhea is a chronic problem.  There are no other known modifying factors.  Past Medical History:  Diagnosis Date  . Atrial fibrillation ([place])    a. echo 4/07: EF 60%  . Atrial tachycardia ([place])    a. s/p EPS 4/09: no inducible SVT - med Tx continued  . Cancer ([place])   . Complication of anesthesia    age 55yo, woke during procedure with GA, paralysed   . Endometrial polyp   . Hyperlipidemia   . Hypertension   . PONV (postoperative nausea and vomiting)   . Rectocele    WITHOUT MENTION OF UTERINE PROLAPSE  . Stroke ([place])   . SVD (spontaneous vaginal delivery)    x 1    Patient Active Problem List   Diagnosis Date Noted  . Cervical cancer ([place]) 07/20/2017  . Anemia due to antineoplastic chemotherapy 07/19/2017  . Hypomagnesemia 06/28/2017  . Syncopal episodes 06/28/2017  . 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 Physical deconditioning 04/26/2017  . Night sweats 04/12/2017  . Port-A-Cath in place 04/04/2017  . Neuropathy associated with cancer ([place]) 04/04/2017  . Goals of care, counseling/discussion 04/04/2017  . Metastatic squamous cell carcinoma involving liver & sacrum with unknown primary site  03/30/2017  . Metastasis to liver of unknown origin ([place]) 03/18/2017  . Cancer of unknown origin ([place]) 03/11/2017  . Metastasis to  liver ([place]) 03/03/2017  . Sacral mass 03/02/2017  . Cancer associated pain 03/02/2017  . Mild single current episode of major depressive disorder ([place]) 08/23/2016  . Cerebral thrombosis with cerebral infarction 12/05/2015  . Stroke (cerebrum) ([place]) 12/05/2015  . Obstructive sleep apnea 07/24/2015  . Preoperative evaluation to rule out surgical contraindication 10/04/2011  . Chest pain, unspecified 06/24/2010  . Shortness of breath 06/24/2010  . Essential hypertension 10/20/2008  . RECTOCELE WITHOUT MENTION OF UTERINE PROLAPSE 10/20/2008  . ENDOMETRIAL POLYP 10/20/2008  . AF (paroxysmal atrial fibrillation) ([place]) 10/20/2008    Past Surgical History:  Procedure Laterality Date  . COLONOSCOPY    . HYSTEROSCOPY    . IR FLUORO GUIDE PORT INSERTION RIGHT  03/23/2017  . IR US GUIDE VASC ACCESS RIGHT  03/23/2017  . LEEP    . Pylonidal cystect    . TUBAL LIGATION    . WISDOM TOOTH EXTRACTION       OB History   None      Home Medications    Prior to Admission medications   Medication Sig Start Date End Date Taking? Authorizing Provider  ALPRAZolam (XANAX) 0.25 MG tablet Take 1 tablet (0.25 mg total) by mouth 3 (three) times daily as needed for sleep. 08/09/17  Yes [name], MD  atorvastatin (LIPITOR) 40 MG tablet Take 1 tablet (40 mg total) by mouth daily at 6 PM. 12/06/15  Yes Dhungel, Flonnie Overman, MD  cholecalciferol (VITAMIN D) 1000 units tablet Take 2,000 Units by mouth daily.   Yes [provider]  citalopram (CELEXA) 20 MG tablet Take 20 mg by mouth daily.   Yes [provider]  clonazePAM (KLONOPIN) 0.5 MG tablet Take 0.5 mg by mouth daily. 06/06/17  Yes [provider]  flecainide (TAMBOCOR) 50 MG tablet Take 1 tablet (50 mg total) by mouth daily. 09/14/16  Yes Evans Lance, MD  gabapentin (NEURONTIN) 300 MG capsule Take 300-600 mg by mouth 2 (two) times daily. 300 mg in the am and 600 mg at night   Yes [provider]  HYDROmorphone  (DILAUDID) 4 MG tablet Take 1 tablet (4 mg total) by mouth every 6 (six) hours as needed for severe pain. 08/09/17  Yes Gorsuch, Ni, MD  lidocaine-prilocaine (EMLA) cream Apply 1 application topically as needed. Patient taking differently: Apply 1 application topically as needed (port access).  04/01/17  Yes Gorsuch, Ni, MD  lisinopril (PRINIVIL,ZESTRIL) 20 MG tablet Take 1 tablet (20 mg total) by mouth daily. 09/01/17  Yes Gorsuch, Ni, MD  magnesium oxide (MAG-OX) 400 (241.3 Mg) MG tablet Take 1 tablet (400 mg total) by mouth 3 (three) times daily. 09/01/17  Yes Gorsuch, Ni, MD  metFORMIN (GLUCOPHAGE) 500 MG tablet Take 500 mg by mouth 2 (two) times daily. 05/25/17  Yes [provider]  metoprolol tartrate (LOPRESSOR) 50 MG tablet Take 1 tablet (50 mg total) by mouth 2 (two) times daily. 09/01/17  Yes Gorsuch, Ernst Spell, MD  Omega-3 Fatty Acids (FISH OIL) 1000 MG CAPS Take 2 capsules by mouth daily.     Yes [provider]  ondansetron (ZOFRAN) 8 MG tablet Take 1 tablet (8 mg total) by mouth every 8 (eight) hours as needed for nausea. 04/26/17  Yes [name], MD  prochlorperazine (COMPAZINE) 10 MG tablet Take 10 mg by mouth every 6 (six) hours as needed for nausea or vomiting.   Yes [provider]  rivaroxaban (XARELTO) 20 MG TABS tablet Take 1 tablet (20 mg total) by mouth daily. 12/06/15  Yes Dhungel, Nishant, MD  polyethylene glycol (MIRALAX / GLYCOLAX) packet Take 17 g by mouth daily. Patient not taking: Reported on 09/15/2017 04/01/17   Shelly Coss, MD    Family History Family History  Problem Relation Age of Onset  . Hypertension Father   . Diabetes Father   . Stroke Father   . Hypertension Mother   . Stroke Mother   . Cancer Mother        cervical ca  . Cancer Sister 72       uterine ca  . Cancer Maternal Grandmother        lung ca    Social History Social History   Tobacco Use  . Smoking status: Former Smoker    Packs/day: 0.50    Years: 10.00    Pack  years: 5.00    Types: Cigarettes    Last attempt to quit: 02/22/1998    Years since quitting: 19.5  . Smokeless tobacco: Never Used  Substance Use Topics  . Alcohol use: No  . Drug use: No     Allergies   Patient has no known allergies.   Review of Systems Review of Systems  All other systems reviewed and are negative.    Physical Exam Updated Vital Signs BP (!) 156/96 (BP Location: Right Arm)   Pulse 74   Temp 98.5 F (36.9 C) (Oral)   Resp 18   Ht 5\' 7"  (  1.702 m)   Wt 117.9 kg (260 lb)   LMP 02/12/2012   SpO2 99%   BMI 40.72 kg/m   Physical Exam  Constitutional: She is oriented to person, place, and time. She appears well-developed and well-nourished. She does not appear ill.  HENT:  Head: Normocephalic and atraumatic.  Eyes: Pupils are equal, round, and reactive to light. Conjunctivae and EOM are normal.  Neck: Normal range of motion and phonation normal. Neck supple.  Cardiovascular: Normal rate and regular rhythm.  Pulmonary/Chest: Effort normal and breath sounds normal. She exhibits no tenderness.  Abdominal: Soft. She exhibits no distension and no mass. There is tenderness (Right upper quadrant, moderate). There is no rebound and no guarding.  Musculoskeletal: Normal range of motion. She exhibits no edema or deformity.  Neurological: She is alert and oriented to person, place, and time. No cranial nerve deficit. She exhibits normal muscle tone.  Skin: Skin is warm and dry.  Psychiatric: She has a normal mood and affect. Her behavior is normal. Judgment and thought content normal.  Nursing note and vitals reviewed.    ED Treatments / Results  Labs (all labs ordered are listed, but only abnormal results are displayed) Labs Reviewed  COMPREHENSIVE METABOLIC PANEL - Abnormal; Notable for the following components:      Result Value   Albumin 3.1 (*)    All other components within normal limits  MAGNESIUM - Abnormal; Notable for the following components:    Magnesium 1.6 (*)    All other components within normal limits  CBC - Abnormal; Notable for the following components:   RBC 3.24 (*)    Hemoglobin 9.7 (*)    HCT 30.1 (*)    Platelets 127 (*)    All other components within normal limits  LIPASE, BLOOD  URINALYSIS, ROUTINE W REFLEX MICROSCOPIC  I-STAT BETA HCG BLOOD, ED ([facility], [facility], [facility] ONLY)    EKG None  Radiology US Abdomen Complete  Result Date: 09/15/2017 CLINICAL DATA:  Nausea and abdominal pain EXAM: ABDOMEN ULTRASOUND COMPLETE COMPARISON:  CT 08/08/2017 FINDINGS: Gallbladder: No gallstones or wall thickening visualized. No sonographic Murphy sign noted by sonographer. Common bile duct: Diameter: 6.6 mm Liver: Increased hepatic echogenicity. Heterogenous liver masses consistent with metastatic disease. Dominant mass in the subcapsular right lobe measures 7.3 x 5.6 x 7.7 cm. Additional hypoechoic mass in the left lobe measuring 2.3 x 1.9 x 2.3 cm. Portal vein is patent on color Doppler imaging with normal direction of blood flow towards the liver. IVC: No abnormality visualized. Pancreas: Visualized portion unremarkable. Spleen: Size and appearance within normal limits. Right Kidney: Length: 12.5 cm. Echogenicity within normal limits. No mass or hydronephrosis visualized. Left Kidney: Length: 12.1 cm. Echogenicity within normal limits. No mass or hydronephrosis visualized. Abdominal aorta: No aneurysm visualized. Other findings: None. IMPRESSION: 1. Negative for gallstones or cholecystitis. Common bile duct diameter upper normal at 6.6 mm 2. Increased hepatic echogenicity consistent with fatty infiltration or hepatocellular disease. Multiple hypoechoic masses consistent with metastatic disease as noted on prior CT. Electronically Signed   By: Donavan Foil M.D.   On: 09/15/2017 19:16    Procedures Procedures (including critical care time)  Medications Ordered in ED Medications  sodium chloride 0.9 % bolus 500 mL (500 mLs Intravenous New  Bag/Given 09/15/17 1821)  prochlorperazine (COMPAZINE) injection 10 mg (10 mg Intravenous Given 09/15/17 1822)     Initial Impression / Assessment and Plan / ED Course  I have reviewed the triage vital signs and the  nursing notes.  Pertinent labs & imaging results that were available during my care of the patient were reviewed by me and considered in my medical decision making (see chart for details).      Patient Vitals for the past 24 hrs:  BP Temp Temp src Pulse Resp SpO2 Height Weight  09/15/17 1747 (!) 156/96 98.5 F (36.9 C) Oral 74 18 99 % - -  09/15/17 1703 - - - - - - 5\' 7"  (1.702 m) 117.9 kg (260 lb)  09/15/17 1702 (!) 188/109 98.5 F (36.9 C) Oral (!) 102 20 98 % - -    8:00 PM Reevaluation with update and discussion. After initial assessment and treatment, an updated evaluation reveals she is comfortable at this time and has no further complaints.  Findings discussed with patient and husband, all questions answered.  Patient states she has plenty of antiemetic to use at home. Daleen Bo   Medical Decision Making: Nonspecific right upper quadrant pain with normal-appearing gallbladder both on imaging and on laboratory evaluation.  Presence of liver metastases, likely indicates the source of her discomfort.  No indication for hospitalization, or further intervention in the ED setting at this time.  Further work-up can be managed by oncology, with consideration for referral to GI and/or general surgery.  CRITICAL CARE-no Performed by: Daleen Bo   Nursing Notes Reviewed/ Care Coordinated Applicable Imaging Reviewed Interpretation of Laboratory Data incorporated into ED treatment  The patient appears reasonably screened and/or stabilized for discharge and I doubt any other medical condition or other [facility] requiring further screening, evaluation, or treatment in the ED at this time prior to discharge.  Plan: Home Medications-continue usual medications; Home  Treatments-gradually advance diet; return here if the recommended treatment, does not improve the symptoms; Recommended follow up-oncology follow-up for management of abdominal pain, with hepatic metastasis, likely cause of symptoms presenting today.     Final Clinical Impressions(s) / ED Diagnoses   Final diagnoses:  Right upper quadrant abdominal pain  Nausea    ED Discharge Orders    None       Daleen Bo, MD 09/17/17 [phone]

## 2017-09-15 NOTE — ED Notes (Signed)
Pt has been notified that urine collection is needed. Cannot produce at this time

## 2017-09-15 NOTE — ED Notes (Signed)
Pt reports that she has been having nausea and vomiting x 2 weeks. Pt reports that she has not been able to keep fluid and foods down 80% of the time. Pt reports RUQ dull 8/10 pain.

## 2017-09-15 NOTE — Discharge Instructions (Addendum)
There was no laboratory or ultrasound signs for gallbladder disease.  Hepatic masses were seen, similar to those on recent CT imaging.  Discussed possible further work-up for gallbladder disease with a HIDA scan, with your oncologist.  She may prefer to refer you to a GI doctor or general surgeon.  However, your symptoms might be related to the metastatic liver disease, primarily.

## 2017-09-15 NOTE — ED Triage Notes (Signed)
Pt reports been having RUQ abd pains with n/v that is 80% after eating. Reports called PCP for ultrasound scan since she remembers being told had gallstone on previous scan before.

## 2017-09-20 ENCOUNTER — Other Ambulatory Visit: Payer: 59

## 2017-09-20 ENCOUNTER — Ambulatory Visit: Payer: 59

## 2017-09-20 ENCOUNTER — Ambulatory Visit: Payer: 59 | Admitting: Hematology and Oncology

## 2017-09-20 MED FILL — CITALOPRAM HBR 40 MG TABLET: 40 | 90 days supply | Qty: 90 | Fill #0

## 2017-09-21 ENCOUNTER — Other Ambulatory Visit: Payer: Self-pay | Admitting: Hematology and Oncology

## 2017-09-22 ENCOUNTER — Inpatient Hospital Stay: Payer: 59

## 2017-09-22 ENCOUNTER — Telehealth: Payer: Self-pay | Admitting: Hematology and Oncology

## 2017-09-22 ENCOUNTER — Inpatient Hospital Stay (HOSPITAL_BASED_OUTPATIENT_CLINIC_OR_DEPARTMENT_OTHER): Payer: 59 | Admitting: Hematology and Oncology

## 2017-09-22 ENCOUNTER — Inpatient Hospital Stay: Payer: 59 | Attending: Hematology and Oncology

## 2017-09-22 ENCOUNTER — Telehealth: Payer: Self-pay

## 2017-09-22 VITALS — BP 110/73 | HR 77 | Temp 98.5°F | Resp 18 | Ht 67.0 in | Wt 249.1 lb

## 2017-09-22 DIAGNOSIS — C539 Malignant neoplasm of cervix uteri, unspecified: Secondary | ICD-10-CM | POA: Diagnosis not present

## 2017-09-22 DIAGNOSIS — G63 Polyneuropathy in diseases classified elsewhere: Secondary | ICD-10-CM

## 2017-09-22 DIAGNOSIS — Z7901 Long term (current) use of anticoagulants: Secondary | ICD-10-CM | POA: Insufficient documentation

## 2017-09-22 DIAGNOSIS — R112 Nausea with vomiting, unspecified: Secondary | ICD-10-CM

## 2017-09-22 DIAGNOSIS — D6481 Anemia due to antineoplastic chemotherapy: Secondary | ICD-10-CM | POA: Diagnosis not present

## 2017-09-22 DIAGNOSIS — R1011 Right upper quadrant pain: Secondary | ICD-10-CM | POA: Insufficient documentation

## 2017-09-22 DIAGNOSIS — E860 Dehydration: Secondary | ICD-10-CM | POA: Insufficient documentation

## 2017-09-22 DIAGNOSIS — C801 Malignant (primary) neoplasm, unspecified: Secondary | ICD-10-CM | POA: Diagnosis not present

## 2017-09-22 DIAGNOSIS — C787 Secondary malignant neoplasm of liver and intrahepatic bile duct: Secondary | ICD-10-CM

## 2017-09-22 DIAGNOSIS — R634 Abnormal weight loss: Secondary | ICD-10-CM | POA: Diagnosis not present

## 2017-09-22 DIAGNOSIS — Z79899 Other long term (current) drug therapy: Secondary | ICD-10-CM

## 2017-09-22 DIAGNOSIS — Z7984 Long term (current) use of oral hypoglycemic drugs: Secondary | ICD-10-CM

## 2017-09-22 DIAGNOSIS — Z923 Personal history of irradiation: Secondary | ICD-10-CM

## 2017-09-22 DIAGNOSIS — T380X5A Adverse effect of glucocorticoids and synthetic analogues, initial encounter: Secondary | ICD-10-CM

## 2017-09-22 DIAGNOSIS — E099 Drug or chemical induced diabetes mellitus without complications: Secondary | ICD-10-CM | POA: Diagnosis not present

## 2017-09-22 DIAGNOSIS — Z95828 Presence of other vascular implants and grafts: Secondary | ICD-10-CM

## 2017-09-22 DIAGNOSIS — Z5112 Encounter for antineoplastic immunotherapy: Secondary | ICD-10-CM | POA: Insufficient documentation

## 2017-09-22 DIAGNOSIS — E876 Hypokalemia: Secondary | ICD-10-CM

## 2017-09-22 DIAGNOSIS — K760 Fatty (change of) liver, not elsewhere classified: Secondary | ICD-10-CM | POA: Insufficient documentation

## 2017-09-22 DIAGNOSIS — G620 Drug-induced polyneuropathy: Secondary | ICD-10-CM

## 2017-09-22 DIAGNOSIS — G893 Neoplasm related pain (acute) (chronic): Secondary | ICD-10-CM | POA: Insufficient documentation

## 2017-09-22 DIAGNOSIS — R5381 Other malaise: Secondary | ICD-10-CM

## 2017-09-22 LAB — CBC WITH DIFFERENTIAL/PLATELET
BASOS ABS: 0 10*3/uL (ref 0.0–0.1)
BASOS PCT: 0 %
Eosinophils Absolute: 0.1 10*3/uL (ref 0.0–0.5)
Eosinophils Relative: 1 %
HCT: 33.9 % — ABNORMAL LOW (ref 34.8–46.6)
HEMOGLOBIN: 10.9 g/dL — AB (ref 11.6–15.9)
LYMPHS ABS: 1.7 10*3/uL (ref 0.9–3.3)
Lymphocytes Relative: 18 %
MCH: 29.6 pg (ref 25.1–34.0)
MCHC: 32.2 g/dL (ref 31.5–36.0)
MCV: 92.1 fL (ref 79.5–101.0)
Monocytes Absolute: 0.9 10*3/uL (ref 0.1–0.9)
Monocytes Relative: 10 %
Neutro Abs: 6.5 10*3/uL (ref 1.5–6.5)
Neutrophils Relative %: 71 %
Platelets: 212 10*3/uL (ref 145–400)
RBC: 3.68 MIL/uL — AB (ref 3.70–5.45)
RDW: 15.5 % — ABNORMAL HIGH (ref 11.2–14.5)
WBC: 9.2 10*3/uL (ref 3.9–10.3)

## 2017-09-22 LAB — COMPREHENSIVE METABOLIC PANEL
ALBUMIN: 3.3 g/dL — AB (ref 3.5–5.0)
ALK PHOS: 108 U/L (ref 38–126)
ALT: 34 U/L (ref 0–44)
ANION GAP: 12 (ref 5–15)
AST: 32 U/L (ref 15–41)
BILIRUBIN TOTAL: 0.7 mg/dL (ref 0.3–1.2)
BUN: 14 mg/dL (ref 6–20)
CO2: 25 mmol/L (ref 22–32)
Calcium: 9.8 mg/dL (ref 8.9–10.3)
Chloride: 100 mmol/L (ref 98–111)
Creatinine, Ser: 0.81 mg/dL (ref 0.44–1.00)
GFR calc Af Amer: >60 mL/min (ref >60)
GFR calc non Af Amer: >60 mL/min (ref >60)
GLUCOSE: 168 mg/dL — AB (ref 70–99)
Potassium: 4.1 mmol/L (ref 3.5–5.1)
SODIUM: 137 mmol/L (ref 135–145)
TOTAL PROTEIN: 8.2 g/dL — AB (ref 6.5–8.1)

## 2017-09-22 LAB — MAGNESIUM: Magnesium: 1.5 mg/dL — ABNORMAL LOW (ref 1.7–2.4)

## 2017-09-22 LAB — TOTAL PROTEIN, URINE DIPSTICK: Protein, ur: <30 mg/dL — AB

## 2017-09-22 MED ORDER — SODIUM CHLORIDE 0.9% FLUSH
10.0000 mL | Freq: Once | INTRAVENOUS | Status: AC
  Administered 2017-09-22: 10 mL
  Filled 2017-09-22: qty 10

## 2017-09-22 MED ORDER — PROMETHAZINE HCL 25 MG/ML IJ SOLN
25.0000 mg | Freq: Once | INTRAMUSCULAR | Status: AC
  Administered 2017-09-22: 25 mg via INTRAVENOUS

## 2017-09-22 MED ORDER — PROMETHAZINE HCL 25 MG/ML IJ SOLN
INTRAMUSCULAR | Status: AC
  Filled 2017-09-22: qty 1

## 2017-09-22 MED ORDER — SODIUM CHLORIDE 0.9 % IV SOLN
15.0000 mg/kg | Freq: Once | INTRAVENOUS | Status: AC
  Administered 2017-09-22: 1800 mg via INTRAVENOUS
  Filled 2017-09-22: qty 64

## 2017-09-22 MED ORDER — VALACYCLOVIR HCL 1 G PO TABS: 1000.0000 mg | ORAL_TABLET | Freq: Three times a day (TID) | ORAL | 0 refills | Status: DC

## 2017-09-22 MED ORDER — SCOPOLAMINE 1 MG/3DAYS TD PT72: 1.0000 | MEDICATED_PATCH | TRANSDERMAL | 12 refills | Status: DC

## 2017-09-22 MED ORDER — SODIUM CHLORIDE 0.9 % IV SOLN
Freq: Once | INTRAVENOUS | Status: AC
  Administered 2017-09-22: 14:00:00 via INTRAVENOUS
  Filled 2017-09-22: qty 250

## 2017-09-22 MED ORDER — MAGNESIUM SULFATE 50 % IJ SOLN
2.0000 g | Freq: Once | INTRAMUSCULAR | Status: AC
  Administered 2017-09-22: 2 g via INTRAVENOUS
  Filled 2017-09-22: qty 4

## 2017-09-22 MED FILL — SCOPOLAMINE 1 MG/3DAYS PT72: 1 | 30 days supply | Qty: 10 | Fill #0

## 2017-09-22 MED FILL — valACYclovir HCL 1 GM TABS: 1 | 7 days supply | Qty: 21 | Fill #0

## 2017-09-22 NOTE — Telephone Encounter (Signed)
-----   Message from [name], MD sent at 09/22/2017  8:16 AM EDT ----- Regarding: Valtrex refill request I received electronic request for Valtrex refill Does she need it? When was the last time she use it?

## 2017-09-22 NOTE — Patient Instructions (Signed)
[facility] Discharge Instructions for Patients Receiving Chemotherapy  Today you received the following chemotherapy agents Avastin To help prevent nausea and vomiting after your treatment, we encourage you to take your nausea medication as directed   If you develop nausea and vomiting that is not controlled by your nausea medication, call the clinic.   BELOW ARE SYMPTOMS THAT SHOULD BE REPORTED IMMEDIATELY:  *FEVER GREATER THAN 100.5 F  *CHILLS WITH OR WITHOUT FEVER  NAUSEA AND VOMITING THAT IS NOT CONTROLLED WITH YOUR NAUSEA MEDICATION  *UNUSUAL SHORTNESS OF BREATH  *UNUSUAL BRUISING OR BLEEDING  TENDERNESS IN MOUTH AND THROAT WITH OR WITHOUT PRESENCE OF ULCERS  *URINARY PROBLEMS  *BOWEL PROBLEMS  UNUSUAL RASH Items with * indicate a potential emergency and should be followed up as soon as possible.  Feel free to call the clinic should you have any questions or concerns. The clinic phone number is [phone].  Please show the [place] at check-in to the Emergency Department and triage nurse.   Dehydration, Adult Dehydration is when there is not enough fluid or water in your body. This happens when you lose more fluids than you take in. Dehydration can range from mild to very bad. It should be treated right away to keep it from getting very bad. Symptoms of mild dehydration may include:  Thirst.  Dry lips.  Slightly dry mouth.  Dry, warm skin.  Dizziness. Symptoms of moderate dehydration may include:  Very dry mouth.  Muscle cramps.  Dark pee (urine). Pee may be the color of tea.  Your body making less pee.  Your eyes making fewer tears.  Heartbeat that is uneven or faster than normal (palpitations).  Headache.  Light-headedness, especially when you stand up from sitting.  Fainting (syncope). Symptoms of very bad dehydration may include:  Changes in skin, such as: ? Cold and clammy skin. ? Blotchy (mottled) or pale  skin. ? Skin that does not quickly return to normal after being lightly pinched and let go (poor skin turgor).  Changes in body fluids, such as: ? Feeling very thirsty. ? Your eyes making fewer tears. ? Not sweating when body temperature is high, such as in hot weather. ? Your body making very little pee.  Changes in vital signs, such as: ? Weak pulse. ? Pulse that is more than 100 beats a minute when you are sitting still. ? Fast breathing. ? Low blood pressure.  Other changes, such as: ? Sunken eyes. ? Cold hands and feet. ? Confusion. ? Lack of energy (lethargy). ? Trouble waking up from sleep. ? Short-term weight loss. ? Unconsciousness. Follow these instructions at home:  If told by your doctor, drink an ORS: ? Make an ORS by using instructions on the package. ? Start by drinking small amounts, about  cup (120 mL) every 5-10 minutes. ? Slowly drink more until you have had the amount that your doctor said to have.  Drink enough clear fluid to keep your pee clear or pale yellow. If you were told to drink an ORS, finish the ORS first, then start slowly drinking clear fluids. Drink fluids such as: ? Water. Do not drink only water by itself. Doing that can make the salt (sodium) level in your body get too low (hyponatremia). ? Ice chips. ? Fruit juice that you have added water to (diluted). ? Low-calorie sports drinks.  Avoid: ? Alcohol. ? Drinks that have a lot of sugar. These include high-calorie sports drinks, fruit juice that  does not have water added, and soda. ? Caffeine. ? Foods that are greasy or have a lot of fat or sugar.  Take over-the-counter and prescription medicines only as told by your doctor.  Do not take salt tablets. Doing that can make the salt level in your body get too high (hypernatremia).  Eat foods that have minerals (electrolytes). Examples include bananas, oranges, potatoes, tomatoes, and spinach.  Keep all follow-up visits as told by your  doctor. This is important. Contact a doctor if:  You have belly (abdominal) pain that: ? Gets worse. ? Stays in one area (localizes).  You have a rash.  You have a stiff neck.  You get angry or annoyed more easily than normal (irritability).  You are more sleepy than normal.  You have a harder time waking up than normal.  You feel: ? Weak. ? Dizzy. ? Very thirsty.  You have peed (urinated) only a small amount of very dark pee during 6-8 hours. Get help right away if:  You have symptoms of very bad dehydration.  You cannot drink fluids without throwing up (vomiting).  Your symptoms get worse with treatment.  You have a fever.  You have a very bad headache.  You are throwing up or having watery poop (diarrhea) and it: ? Gets worse. ? Does not go away.  You have blood or something green (bile) in your throw-up.  You have blood in your poop (stool). This may cause poop to look black and tarry.  You have not peed in 6-8 hours.  You pass out (faint).  Your heart rate when you are sitting still is more than 100 beats a minute.  You have trouble breathing. This information is not intended to replace advice given to you by your health care provider. Make sure you discuss any questions you have with your health care provider. Document Released: 12/05/2008 Document Revised: 08/29/2015 Document Reviewed: 04/04/2015 Elsevier Interactive Patient Education  2018 [employer].

## 2017-09-22 NOTE — Telephone Encounter (Signed)
Scheduled appointments for August and September 8/1 los. [facility] radiology will call patient re scan. Patient declined printout.

## 2017-09-22 NOTE — Telephone Encounter (Signed)
Called regarding below message. She is in the lab and will see Dr. Alvy Bimler at 1230.

## 2017-09-23 ENCOUNTER — Encounter: Payer: Self-pay | Admitting: Hematology and Oncology

## 2017-09-23 DIAGNOSIS — C787 Secondary malignant neoplasm of liver and intrahepatic bile duct: Secondary | ICD-10-CM | POA: Diagnosis not present

## 2017-09-23 DIAGNOSIS — G4733 Obstructive sleep apnea (adult) (pediatric): Secondary | ICD-10-CM | POA: Diagnosis not present

## 2017-09-23 DIAGNOSIS — C801 Malignant (primary) neoplasm, unspecified: Secondary | ICD-10-CM | POA: Diagnosis not present

## 2017-09-23 DIAGNOSIS — C539 Malignant neoplasm of cervix uteri, unspecified: Secondary | ICD-10-CM | POA: Diagnosis not present

## 2017-09-23 NOTE — Assessment & Plan Note (Signed)
She has significant worsening symptoms of nausea and vomiting which is unusual given her last CT imaging show positive response to treatment Recent ultrasound showed no evidence of disease progression Continue supportive care Next imaging will be due in September

## 2017-09-23 NOTE — Assessment & Plan Note (Signed)
She has received intravenous magnesium recently She is instructed to increase oral magnesium to 3 times a day

## 2017-09-23 NOTE — Progress Notes (Signed)
[place] OFFICE PROGRESS NOTE  Patient Care Team: Lennie Odor, PA-C as PCP - General (Nurse Practitioner) Arvella Nigh, MD as Obstetrician (Obstetrics and Gynecology) Evans Lance, MD as Consulting Physician (Cardiology)  ASSESSMENT & PLAN:  Cancer of unknown origin [facility]) She has significant worsening symptoms of nausea and vomiting which is unusual given her last CT imaging show positive response to treatment Recent ultrasound showed no evidence of disease progression Continue supportive care Next imaging will be due in September  Metastasis to liver [facility]) She continues to have vague right upper quadrant pain and nausea Last CT imaging show fatty liver disease Recent ultrasound showed no evidence of gallbladder disease We will continue symptom management  Nausea with vomiting She has severe nausea and vomiting She was not taking antiemetics as prescribed because she felt it was ineffective I recommended she takes alternate scheduled nausea medicine between Zofran alternate with Compazine I also recommend a trial of scopolamine patch I recommend IV fluid hydration and IV antiemetics as tolerated at home through advanced home care agency  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[facility]) She has poorly controlled diabetes She has lost some weight I recommend continue close follow-up and monitoring of blood sugar and adjust medication as needed  Neuropathy associated with cancer [facility]) She has multifactorial peripheral neuropathy She will continue gabapentin for now  Hypomagnesemia She has received intravenous magnesium recently She is instructed to increase oral magnesium to 3 times a day  Physical deconditioning She has significant physical deconditioning due to recent weight loss, severe nausea, vomiting and dehydration As above, we will try to control some of his symptoms at home through advanced home care service In the meantime, we will continue her treatment as  scheduled   Orders Placed This Encounter  Procedures  . CT ABDOMEN PELVIS W CONTRAST    Standing Status:   Future    Standing Expiration Date:   09/23/2018    Order Specific Question:   If indicated for the ordered procedure, I authorize the administration of contrast media per Radiology protocol    Answer:   Yes    Order Specific Question:   Preferred imaging location?    Answer:   [facility]    Order Specific Question:   Radiology Contrast Protocol - do NOT remove file path    Answer:   [email or web address]    Order Specific Question:   Is patient pregnant?    Answer:   No    INTERVAL HISTORY: Please see below for problem oriented charting. She returns with her husband for further follow-up She has been feeling unwell since the last time I saw her She went to the emergency department due to severe nausea and vomiting Ultrasound of the right upper quadrant did not reveal any significant change or acute cholecystitis She is not able to swallow anything She tried to take Zofran and Compazine and when it did not help her, she stopped taking her antiemetics. The patient denies any recent signs or symptoms of bleeding such as spontaneous epistaxis, hematuria or hematochezia. She denies recent fever or chills Her peripheral neuropathy is about the same Her pain appears to be well controlled  SUMMARY OF ONCOLOGIC HISTORY: Oncology History   Biothernostics: 96% probability cervical cancer  Additional evaluation revealed PTEN deletion, negative for MET amplification, PD-L1 expression of 60     Metastasis to liver ([place])   03/03/2017 Initial Diagnosis    Metastasis to liver ([place])      08/08/2017 -  Chemotherapy  The patient had bevacizumab (AVASTIN) 1,800 mg in sodium chloride 0.9 % 100 mL chemo infusion, 15 mg/kg = 1,800 mg, Intravenous,  Once, 3 of 4 cycles Administration: 1,800 mg (08/09/2017), 1,800 mg (09/01/2017), 1,800 mg (09/22/2017)  for  chemotherapy treatment.        Cancer of unknown origin ([place])   03/03/2017 Imaging    1. Multifocal liver metastasis. 2. Left posterior pelvic mass extending into the obturator foramen measures up to 7 cm. Suspicious for malignancy. This should be easily amendable to percutaneous tissue sampling under image guidance. 3. No lytic bone lesions identified of myeloma.      03/09/2017 Procedure    Technically successful ultrasound-guided core liver lesion biopsy      03/09/2017 Pathology Results    A. LIVER LESION, RIGHT LOBE; ULTRASOUND-GUIDED BIOPSY:  - METASTATIC SQUAMOUS CELL CARCINOMA, SEE COMMENT.   Comment: Metastatic carcinoma is immunoreactive for p40, p16, p53 and pancytokeratin while CK7, CK20, and TTF1 are negative. Significance of p16 positivity depends on the site of origin and the pattern of immunoreactivity in this material is non-specific. Possible sites of origin include gynecologic, anal, lung, and head/neck. Correlation with physical exam and additional imaging is required.       03/18/2017 Pathology Results    She had GYN exam and pap smear. Result is negative for malignancy      03/21/2017 - 04/01/2017 Hospital Admission    She was admitted to the hospital for pain management and had radiation therapy      03/23/2017 Procedure    Ultrasound and fluoroscopically guided right internal jugular single lumen power port catheter insertion. Tip in the SVC/RA junction. Catheter ready for use.      05/16/2017 Imaging    Mild decrease in size of left posterior pelvic soft tissue mass and liver metastases. No new or progressive metastatic disease identified.  Stable hepatic steatosis and tiny nonobstructing right renal calculus.      08/08/2017 Imaging    Decreased liver metastases.  Decreased size of left internal iliac mass or lymphadenopathy, which abuts the left sacrum.  No new or progressive metastatic disease.  Stable severe hepatic steatosis.      08/08/2017 -   Chemotherapy    The patient had bevacizumab (AVASTIN) 1,800 mg in sodium chloride 0.9 % 100 mL chemo infusion, 15 mg/kg = 1,800 mg, Intravenous,  Once, 3 of 4 cycles Administration: 1,800 mg (08/09/2017), 1,800 mg (09/01/2017), 1,800 mg (09/22/2017)  for chemotherapy treatment.        Cervical cancer ([place])   07/20/2017 Initial Diagnosis    Cervical cancer ([place])      08/08/2017 -  Chemotherapy    The patient had bevacizumab (AVASTIN) 1,800 mg in sodium chloride 0.9 % 100 mL chemo infusion, 15 mg/kg = 1,800 mg, Intravenous,  Once, 3 of 4 cycles Administration: 1,800 mg (08/09/2017), 1,800 mg (09/01/2017), 1,800 mg (09/22/2017)  for chemotherapy treatment.        REVIEW OF SYSTEMS:   Constitutional: Denies fevers, chills or abnormal weight loss Eyes: Denies blurriness of vision Ears, nose, mouth, throat, and face: Denies mucositis or sore throat Respiratory: Denies cough, dyspnea or wheezes Cardiovascular: Denies palpitation, chest discomfort or lower extremity swelling Skin: Denies abnormal skin rashes Lymphatics: Denies new lymphadenopathy or easy bruising Behavioral/Psych: Mood is stable, no new changes  All other systems were reviewed with the patient and are negative.  I have reviewed the past medical history, past surgical history, social history and family history with the patient  and they are unchanged from previous note.  ALLERGIES:  has No Known Allergies.  MEDICATIONS:  Current Outpatient Medications  Medication Sig Dispense Refill  . ALPRAZolam (XANAX) 0.25 MG tablet Take 1 tablet (0.25 mg total) by mouth 3 (three) times daily as needed for sleep. 60 tablet 0  . atorvastatin (LIPITOR) 40 MG tablet Take 1 tablet (40 mg total) by mouth daily at 6 PM. 30 tablet 0  . cholecalciferol (VITAMIN D) 1000 units tablet Take 2,000 Units by mouth daily.    . citalopram (CELEXA) 20 MG tablet Take 20 mg by mouth daily.    . clonazePAM (KLONOPIN) 0.5 MG tablet Take 0.5 mg by mouth daily.  0   . flecainide (TAMBOCOR) 50 MG tablet Take 1 tablet (50 mg total) by mouth daily. 90 tablet 3  . gabapentin (NEURONTIN) 300 MG capsule Take 300-600 mg by mouth 2 (two) times daily. 300 mg in the am and 600 mg at night    . HYDROmorphone (DILAUDID) 4 MG tablet Take 1 tablet (4 mg total) by mouth every 6 (six) hours as needed for severe pain. 60 tablet 0  . lidocaine-prilocaine (EMLA) cream Apply 1 application topically as needed. (Patient taking differently: Apply 1 application topically as needed (port access). ) 30 g 0  . lisinopril (PRINIVIL,ZESTRIL) 20 MG tablet Take 1 tablet (20 mg total) by mouth daily. 90 tablet 3  . magnesium oxide (MAG-OX) 400 (241.3 Mg) MG tablet Take 1 tablet (400 mg total) by mouth 3 (three) times daily. 60 tablet 3  . metFORMIN (GLUCOPHAGE) 500 MG tablet Take 500 mg by mouth 2 (two) times daily.  3  . metoprolol tartrate (LOPRESSOR) 50 MG tablet Take 1 tablet (50 mg total) by mouth 2 (two) times daily. 90 tablet 3  . Omega-3 Fatty Acids (FISH OIL) 1000 MG CAPS Take 2 capsules by mouth daily.      . ondansetron (ZOFRAN) 8 MG tablet Take 1 tablet (8 mg total) by mouth every 8 (eight) hours as needed for nausea. 90 tablet 1  . polyethylene glycol (MIRALAX / GLYCOLAX) packet Take 17 g by mouth daily. (Patient not taking: Reported on 09/15/2017) 14 each 0  . prochlorperazine (COMPAZINE) 10 MG tablet Take 10 mg by mouth every 6 (six) hours as needed for nausea or vomiting.    . rivaroxaban (XARELTO) 20 MG TABS tablet Take 1 tablet (20 mg total) by mouth daily. 30 tablet 0  . scopolamine (TRANSDERM-SCOP) 1 MG/3DAYS Place 1 patch (1.5 mg total) onto the skin every 3 (three) days. 10 patch 12  . valACYclovir (VALTREX) 1000 MG tablet Take 1 tablet (1,000 mg total) by mouth 3 (three) times daily. 21 tablet 0   No current facility-administered medications for this visit.     PHYSICAL EXAMINATION: ECOG PERFORMANCE STATUS: 2 - Symptomatic, <50% confined to bed  Vitals:    09/22/17 1250  BP: 110/73  Pulse: 77  Resp: 18  Temp: 98.5 F (36.9 C)  SpO2: 100%   Filed Weights   09/22/17 1250  Weight: 249 lb 1.6 oz (113 kg)    GENERAL:alert, in mild distress from nausea SKIN: skin color, texture, turgor are normal, no rashes or significant lesions EYES: normal, Conjunctiva are pink and non-injected, sclera clear OROPHARYNX:no exudate, no erythema and lips, buccal mucosa, and tongue normal  NECK: supple, thyroid normal size, non-tender, without nodularity LYMPH:  no palpable lymphadenopathy in the cervical, axillary or inguinal LUNGS: clear to auscultation and percussion with normal breathing effort HEART:  regular rate & rhythm and no murmurs and no lower extremity edema ABDOMEN:abdomen soft, non-tender and normal bowel sounds Musculoskeletal:no cyanosis of digits and no clubbing  NEURO: alert & oriented x 3 with fluent speech, no focal motor/sensory deficits  LABORATORY DATA:  I have reviewed the data as listed    Component Value Date/Time   NA 137 09/22/2017 1229   K 4.1 09/22/2017 1229   CL 100 09/22/2017 1229   CO2 25 09/22/2017 1229   GLUCOSE 168 (H) 09/22/2017 1229   BUN 14 09/22/2017 1229   CREATININE 0.81 09/22/2017 1229   CREATININE 0.81 06/28/2017 0851   CALCIUM 9.8 09/22/2017 1229   PROT 8.2 (H) 09/22/2017 1229   ALBUMIN 3.3 (L) 09/22/2017 1229   AST 32 09/22/2017 1229   AST 43 (H) 06/28/2017 0851   ALT 34 09/22/2017 1229   ALT 55 06/28/2017 0851   ALKPHOS 108 09/22/2017 1229   BILITOT 0.7 09/22/2017 1229   BILITOT 0.6 06/28/2017 0851   GFRNONAA >60 09/22/2017 1229   GFRNONAA >60 06/28/2017 0851   GFRAA >60 09/22/2017 1229   GFRAA >60 06/28/2017 0851    No results found for: SPEP, UPEP  Lab Results  Component Value Date   WBC 9.2 09/22/2017   NEUTROABS 6.5 09/22/2017   HGB 10.9 (L) 09/22/2017   HCT 33.9 (L) 09/22/2017   MCV 92.1 09/22/2017   PLT 212 09/22/2017      Chemistry      Component Value Date/Time   NA 137  09/22/2017 1229   K 4.1 09/22/2017 1229   CL 100 09/22/2017 1229   CO2 25 09/22/2017 1229   BUN 14 09/22/2017 1229   CREATININE 0.81 09/22/2017 1229   CREATININE 0.81 06/28/2017 0851      Component Value Date/Time   CALCIUM 9.8 09/22/2017 1229   ALKPHOS 108 09/22/2017 1229   AST 32 09/22/2017 1229   AST 43 (H) 06/28/2017 0851   ALT 34 09/22/2017 1229   ALT 55 06/28/2017 0851   BILITOT 0.7 09/22/2017 1229   BILITOT 0.6 06/28/2017 0851       RADIOGRAPHIC STUDIES: I have personally reviewed the radiological images as listed and agreed with the findings in the report. US Abdomen Complete  Result Date: 09/15/2017 CLINICAL DATA:  Nausea and abdominal pain EXAM: ABDOMEN ULTRASOUND COMPLETE COMPARISON:  CT 08/08/2017 FINDINGS: Gallbladder: No gallstones or wall thickening visualized. No sonographic Murphy sign noted by sonographer. Common bile duct: Diameter: 6.6 mm Liver: Increased hepatic echogenicity. Heterogenous liver masses consistent with metastatic disease. Dominant mass in the subcapsular right lobe measures 7.3 x 5.6 x 7.7 cm. Additional hypoechoic mass in the left lobe measuring 2.3 x 1.9 x 2.3 cm. Portal vein is patent on color Doppler imaging with normal direction of blood flow towards the liver. IVC: No abnormality visualized. Pancreas: Visualized portion unremarkable. Spleen: Size and appearance within normal limits. Right Kidney: Length: 12.5 cm. Echogenicity within normal limits. No mass or hydronephrosis visualized. Left Kidney: Length: 12.1 cm. Echogenicity within normal limits. No mass or hydronephrosis visualized. Abdominal aorta: No aneurysm visualized. Other findings: None. IMPRESSION: 1. Negative for gallstones or cholecystitis. Common bile duct diameter upper normal at 6.6 mm 2. Increased hepatic echogenicity consistent with fatty infiltration or hepatocellular disease. Multiple hypoechoic masses consistent with metastatic disease as noted on prior CT. Electronically Signed    By: Donavan Foil M.D.   On: 09/15/2017 19:16    All questions were answered. The patient knows to call the clinic with any  problems, questions or concerns. No barriers to learning was detected.  I spent 25 minutes counseling the patient face to face. The total time spent in the appointment was 40 minutes and more than 50% was on counseling and review of test results  [name], MD 09/23/2017 11:01 AM

## 2017-09-23 NOTE — Assessment & Plan Note (Signed)
She has severe nausea and vomiting She was not taking antiemetics as prescribed because she felt it was ineffective I recommended she takes alternate scheduled nausea medicine between Zofran alternate with Compazine I also recommend a trial of scopolamine patch I recommend IV fluid hydration and IV antiemetics as tolerated at home through advanced home care agency

## 2017-09-23 NOTE — Assessment & Plan Note (Signed)
She continues to have vague right upper quadrant pain and nausea Last CT imaging show fatty liver disease Recent ultrasound showed no evidence of gallbladder disease We will continue symptom management

## 2017-09-23 NOTE — Assessment & Plan Note (Signed)
She has multifactorial peripheral neuropathy She will continue gabapentin for now

## 2017-09-23 NOTE — Assessment & Plan Note (Signed)
She has significant physical deconditioning due to recent weight loss, severe nausea, vomiting and dehydration As above, we will try to control some of his symptoms at home through advanced home care service In the meantime, we will continue her treatment as scheduled

## 2017-09-23 NOTE — Assessment & Plan Note (Signed)
She has poorly controlled diabetes She has lost some weight I recommend continue close follow-up and monitoring of blood sugar and adjust medication as needed

## 2017-10-04 DIAGNOSIS — C801 Malignant (primary) neoplasm, unspecified: Secondary | ICD-10-CM | POA: Diagnosis not present

## 2017-10-07 DIAGNOSIS — C787 Secondary malignant neoplasm of liver and intrahepatic bile duct: Secondary | ICD-10-CM | POA: Diagnosis not present

## 2017-10-07 DIAGNOSIS — C801 Malignant (primary) neoplasm, unspecified: Secondary | ICD-10-CM | POA: Diagnosis not present

## 2017-10-07 DIAGNOSIS — C539 Malignant neoplasm of cervix uteri, unspecified: Secondary | ICD-10-CM | POA: Diagnosis not present

## 2017-10-07 DIAGNOSIS — G4733 Obstructive sleep apnea (adult) (pediatric): Secondary | ICD-10-CM | POA: Diagnosis not present

## 2017-10-10 ENCOUNTER — Telehealth: Payer: Self-pay

## 2017-10-10 DIAGNOSIS — C801 Malignant (primary) neoplasm, unspecified: Secondary | ICD-10-CM | POA: Diagnosis not present

## 2017-10-10 NOTE — Telephone Encounter (Signed)
Nurse with [facility] called and left a message to call her. Ms. [place]ac[place] is complaining of right upper quadrant pain of a 4 out 10. When she is resting it is a 2 out 10.   Called and left a message for the nurse to call the office back.

## 2017-10-11 ENCOUNTER — Encounter: Payer: Self-pay | Admitting: Internal Medicine

## 2017-10-11 NOTE — Telephone Encounter (Signed)
Called Cindy Faulkner regarding below message from [facility]. Nurse at [facility] never called the office back.  She said she is still having the pain in her right upper quadrant and the office is already aware. She has Dilaudid to take prn but has not been taking it because her pain at worse is a 4 out of 10. She states she will talk with Dr. Alvy Bimler on 8/22 appt. Instructed to call if needed, she verbalized understanding.

## 2017-10-12 DIAGNOSIS — C801 Malignant (primary) neoplasm, unspecified: Secondary | ICD-10-CM | POA: Diagnosis not present

## 2017-10-13 ENCOUNTER — Inpatient Hospital Stay (HOSPITAL_BASED_OUTPATIENT_CLINIC_OR_DEPARTMENT_OTHER): Payer: 59 | Admitting: Hematology and Oncology

## 2017-10-13 ENCOUNTER — Inpatient Hospital Stay: Payer: 59

## 2017-10-13 ENCOUNTER — Other Ambulatory Visit: Payer: Self-pay | Admitting: Hematology and Oncology

## 2017-10-13 VITALS — BP 121/73

## 2017-10-13 VITALS — BP 120/49 | HR 110 | Temp 99.0°F | Resp 18 | Ht 67.0 in | Wt 243.8 lb

## 2017-10-13 DIAGNOSIS — C539 Malignant neoplasm of cervix uteri, unspecified: Secondary | ICD-10-CM

## 2017-10-13 DIAGNOSIS — G893 Neoplasm related pain (acute) (chronic): Secondary | ICD-10-CM | POA: Diagnosis not present

## 2017-10-13 DIAGNOSIS — R5383 Other fatigue: Secondary | ICD-10-CM

## 2017-10-13 DIAGNOSIS — T451X5A Adverse effect of antineoplastic and immunosuppressive drugs, initial encounter: Secondary | ICD-10-CM

## 2017-10-13 DIAGNOSIS — C787 Secondary malignant neoplasm of liver and intrahepatic bile duct: Secondary | ICD-10-CM

## 2017-10-13 DIAGNOSIS — Z7901 Long term (current) use of anticoagulants: Secondary | ICD-10-CM

## 2017-10-13 DIAGNOSIS — C801 Malignant (primary) neoplasm, unspecified: Secondary | ICD-10-CM

## 2017-10-13 DIAGNOSIS — Z5112 Encounter for antineoplastic immunotherapy: Secondary | ICD-10-CM | POA: Diagnosis not present

## 2017-10-13 DIAGNOSIS — K760 Fatty (change of) liver, not elsewhere classified: Secondary | ICD-10-CM | POA: Diagnosis not present

## 2017-10-13 DIAGNOSIS — Z7984 Long term (current) use of oral hypoglycemic drugs: Secondary | ICD-10-CM

## 2017-10-13 DIAGNOSIS — R112 Nausea with vomiting, unspecified: Secondary | ICD-10-CM

## 2017-10-13 DIAGNOSIS — G620 Drug-induced polyneuropathy: Secondary | ICD-10-CM

## 2017-10-13 DIAGNOSIS — D6481 Anemia due to antineoplastic chemotherapy: Secondary | ICD-10-CM | POA: Diagnosis not present

## 2017-10-13 DIAGNOSIS — Z79899 Other long term (current) drug therapy: Secondary | ICD-10-CM

## 2017-10-13 DIAGNOSIS — R634 Abnormal weight loss: Secondary | ICD-10-CM | POA: Diagnosis not present

## 2017-10-13 DIAGNOSIS — E099 Drug or chemical induced diabetes mellitus without complications: Secondary | ICD-10-CM | POA: Diagnosis not present

## 2017-10-13 DIAGNOSIS — E860 Dehydration: Secondary | ICD-10-CM | POA: Diagnosis not present

## 2017-10-13 DIAGNOSIS — Z95828 Presence of other vascular implants and grafts: Secondary | ICD-10-CM

## 2017-10-13 DIAGNOSIS — R5381 Other malaise: Secondary | ICD-10-CM

## 2017-10-13 DIAGNOSIS — D539 Nutritional anemia, unspecified: Secondary | ICD-10-CM

## 2017-10-13 DIAGNOSIS — E876 Hypokalemia: Secondary | ICD-10-CM

## 2017-10-13 DIAGNOSIS — Z923 Personal history of irradiation: Secondary | ICD-10-CM

## 2017-10-13 LAB — CBC WITH DIFFERENTIAL/PLATELET
BASOS PCT: 0 %
Basophils Absolute: 0 10*3/uL (ref 0.0–0.1)
EOS ABS: 0 10*3/uL (ref 0.0–0.5)
EOS PCT: 0 %
HCT: 34 % — ABNORMAL LOW (ref 34.8–46.6)
HEMOGLOBIN: 10.9 g/dL — AB (ref 11.6–15.9)
Lymphocytes Relative: 22 %
Lymphs Abs: 1.9 10*3/uL (ref 0.9–3.3)
MCH: 27.8 pg (ref 25.1–34.0)
MCHC: 32.1 g/dL (ref 31.5–36.0)
MCV: 86.7 fL (ref 79.5–101.0)
MONO ABS: 1 10*3/uL — AB (ref 0.1–0.9)
MONOS PCT: 12 %
NEUTROS PCT: 66 %
Neutro Abs: 5.9 10*3/uL (ref 1.5–6.5)
PLATELETS: ADEQUATE 10*3/uL (ref 145–400)
RBC: 3.92 MIL/uL (ref 3.70–5.45)
RDW: 16.2 % — AB (ref 11.2–14.5)
WBC: 8.9 10*3/uL (ref 3.9–10.3)

## 2017-10-13 LAB — COMPREHENSIVE METABOLIC PANEL
ALBUMIN: 2.8 g/dL — AB (ref 3.5–5.0)
ALT: 43 U/L (ref 0–44)
ANION GAP: 11 (ref 5–15)
AST: 38 U/L (ref 15–41)
Alkaline Phosphatase: 119 U/L (ref 38–126)
BUN: 6 mg/dL (ref 6–20)
CO2: 26 mmol/L (ref 22–32)
Calcium: 9.3 mg/dL (ref 8.9–10.3)
Chloride: 97 mmol/L — ABNORMAL LOW (ref 98–111)
Creatinine, Ser: 0.71 mg/dL (ref 0.44–1.00)
GFR calc Af Amer: >60 mL/min (ref >60)
GFR calc non Af Amer: >60 mL/min (ref >60)
GLUCOSE: 146 mg/dL — AB (ref 70–99)
POTASSIUM: 3.8 mmol/L (ref 3.5–5.1)
SODIUM: 134 mmol/L — AB (ref 135–145)
Total Bilirubin: 0.7 mg/dL (ref 0.3–1.2)
Total Protein: 7.7 g/dL (ref 6.5–8.1)

## 2017-10-13 LAB — MAGNESIUM: MAGNESIUM: 1.3 mg/dL — AB (ref 1.7–2.4)

## 2017-10-13 LAB — TOTAL PROTEIN, URINE DIPSTICK: PROTEIN: 30 mg/dL — AB

## 2017-10-13 LAB — VITAMIN B12: VITAMIN B 12: 6437 pg/mL — AB (ref 180–914)

## 2017-10-13 MED ORDER — SODIUM CHLORIDE 0.9 % IV SOLN
15.0000 mg/kg | Freq: Once | INTRAVENOUS | Status: AC
  Administered 2017-10-13: 1800 mg via INTRAVENOUS
  Filled 2017-10-13: qty 64

## 2017-10-13 MED ORDER — GABAPENTIN 300 MG PO CAPS: 300.0000 mg | ORAL_CAPSULE | Freq: Two times a day (BID) | ORAL | 11 refills | Status: DC

## 2017-10-13 MED ORDER — CLONAZEPAM 0.5 MG PO TABS: 0.5000 mg | ORAL_TABLET | Freq: Every day | ORAL | 0 refills | Status: DC

## 2017-10-13 MED ORDER — SODIUM CHLORIDE 0.9 % IV SOLN
Freq: Once | INTRAVENOUS | Status: AC
  Administered 2017-10-13: 15:00:00 via INTRAVENOUS
  Filled 2017-10-13: qty 250

## 2017-10-13 MED ORDER — ALPRAZOLAM 0.25 MG PO TABS: 0.2500 mg | ORAL_TABLET | Freq: Three times a day (TID) | ORAL | 0 refills | Status: DC | PRN

## 2017-10-13 MED ORDER — SODIUM CHLORIDE 0.9% FLUSH
10.0000 mL | INTRAVENOUS | Status: DC | PRN
  Administered 2017-10-13: 10 mL
  Filled 2017-10-13: qty 10

## 2017-10-13 MED ORDER — SODIUM CHLORIDE 0.9 % IV SOLN
2.0000 g | Freq: Once | INTRAVENOUS | Status: AC
  Administered 2017-10-13: 2 g via INTRAVENOUS
  Filled 2017-10-13: qty 4

## 2017-10-13 MED ORDER — SODIUM CHLORIDE 0.9% FLUSH
10.0000 mL | Freq: Once | INTRAVENOUS | Status: AC
  Administered 2017-10-13: 10 mL
  Filled 2017-10-13: qty 10

## 2017-10-13 MED ORDER — HEPARIN SOD (PORK) LOCK FLUSH 100 UNIT/ML IV SOLN
500.0000 [IU] | Freq: Once | INTRAVENOUS | Status: AC | PRN
  Administered 2017-10-13: 500 [IU]
  Filled 2017-10-13: qty 5

## 2017-10-13 MED FILL — GABAPENTIN 300 MG CAPSULE: 300 | 30 days supply | Qty: 90 | Fill #0

## 2017-10-13 MED FILL — clonazePAM 0.5 MG TABS: 0.5 | 30 days supply | Qty: 30 | Fill #0

## 2017-10-13 MED FILL — ALPRAZolam 0.25 MG TABS: 0.25 | 20 days supply | Qty: 60 | Fill #0

## 2017-10-13 NOTE — Patient Instructions (Addendum)
[facility] Discharge Instructions for Patients Receiving Chemotherapy  Today you received the following chemotherapy agents Avastin and Magnesium. To help prevent nausea and vomiting after your treatment, we encourage you to take your nausea medication as directed   If you develop nausea and vomiting that is not controlled by your nausea medication, call the clinic.   BELOW ARE SYMPTOMS THAT SHOULD BE REPORTED IMMEDIATELY:  *FEVER GREATER THAN 100.5 F  *CHILLS WITH OR WITHOUT FEVER  NAUSEA AND VOMITING THAT IS NOT CONTROLLED WITH YOUR NAUSEA MEDICATION  *UNUSUAL SHORTNESS OF BREATH  *UNUSUAL BRUISING OR BLEEDING  TENDERNESS IN MOUTH AND THROAT WITH OR WITHOUT PRESENCE OF ULCERS  *URINARY PROBLEMS  *BOWEL PROBLEMS  UNUSUAL RASH Items with * indicate a potential emergency and should be followed up as soon as possible.  Feel free to call the clinic should you have any questions or concerns. The clinic phone number is [phone].  Please show the [facility] at check-in to the Emergency Department and triage nurse.   Dehydration, Adult Dehydration is when there is not enough fluid or water in your body. This happens when you lose more fluids than you take in. Dehydration can range from mild to very bad. It should be treated right away to keep it from getting very bad. Symptoms of mild dehydration may include:  Thirst.  Dry lips.  Slightly dry mouth.  Dry, warm skin.  Dizziness. Symptoms of moderate dehydration may include:  Very dry mouth.  Muscle cramps.  Dark pee (urine). Pee may be the color of tea.  Your body making less pee.  Your eyes making fewer tears.  Heartbeat that is uneven or faster than normal (palpitations).  Headache.  Light-headedness, especially when you stand up from sitting.  Fainting (syncope). Symptoms of very bad dehydration may include:  Changes in skin, such as: ? Cold and clammy skin. ? Blotchy  (mottled) or pale skin. ? Skin that does not quickly return to normal after being lightly pinched and let go (poor skin turgor).  Changes in body fluids, such as: ? Feeling very thirsty. ? Your eyes making fewer tears. ? Not sweating when body temperature is high, such as in hot weather. ? Your body making very little pee.  Changes in vital signs, such as: ? Weak pulse. ? Pulse that is more than 100 beats a minute when you are sitting still. ? Fast breathing. ? Low blood pressure.  Other changes, such as: ? Sunken eyes. ? Cold hands and feet. ? Confusion. ? Lack of energy (lethargy). ? Trouble waking up from sleep. ? Short-term weight loss. ? Unconsciousness. Follow these instructions at home:  If told by your doctor, drink an ORS: ? Make an ORS by using instructions on the package. ? Start by drinking small amounts, about  cup (120 mL) every 5-10 minutes. ? Slowly drink more until you have had the amount that your doctor said to have.  Drink enough clear fluid to keep your pee clear or pale yellow. If you were told to drink an ORS, finish the ORS first, then start slowly drinking clear fluids. Drink fluids such as: ? Water. Do not drink only water by itself. Doing that can make the salt (sodium) level in your body get too low (hyponatremia). ? Ice chips. ? Fruit juice that you have added water to (diluted). ? Low-calorie sports drinks.  Avoid: ? Alcohol. ? Drinks that have a lot of sugar. These include high-calorie sports drinks, fruit  juice that does not have water added, and soda. ? Caffeine. ? Foods that are greasy or have a lot of fat or sugar.  Take over-the-counter and prescription medicines only as told by your doctor.  Do not take salt tablets. Doing that can make the salt level in your body get too high (hypernatremia).  Eat foods that have minerals (electrolytes). Examples include bananas, oranges, potatoes, tomatoes, and spinach.  Keep all follow-up visits  as told by your doctor. This is important. Contact a doctor if:  You have belly (abdominal) pain that: ? Gets worse. ? Stays in one area (localizes).  You have a rash.  You have a stiff neck.  You get angry or annoyed more easily than normal (irritability).  You are more sleepy than normal.  You have a harder time waking up than normal.  You feel: ? Weak. ? Dizzy. ? Very thirsty.  You have peed (urinated) only a small amount of very dark pee during 6-8 hours. Get help right away if:  You have symptoms of very bad dehydration.  You cannot drink fluids without throwing up (vomiting).  Your symptoms get worse with treatment.  You have a fever.  You have a very bad headache.  You are throwing up or having watery poop (diarrhea) and it: ? Gets worse. ? Does not go away.  You have blood or something green (bile) in your throw-up.  You have blood in your poop (stool). This may cause poop to look black and tarry.  You have not peed in 6-8 hours.  You pass out (faint).  Your heart rate when you are sitting still is more than 100 beats a minute.  You have trouble breathing. This information is not intended to replace advice given to you by your health care provider. Make sure you discuss any questions you have with your health care provider. Document Released: 12/05/2008 Document Revised: 08/29/2015 Document Reviewed: 04/04/2015 Elsevier Interactive Patient Education  2018 [employer].

## 2017-10-14 ENCOUNTER — Telehealth: Payer: Self-pay | Admitting: *Deleted

## 2017-10-14 ENCOUNTER — Encounter: Payer: Self-pay | Admitting: Hematology and Oncology

## 2017-10-14 ENCOUNTER — Telehealth: Payer: Self-pay | Admitting: Hematology and Oncology

## 2017-10-14 LAB — TSH: TSH: 0.573 u[IU]/mL (ref 0.308–3.960)

## 2017-10-14 LAB — IRON AND TIBC
Iron: 24 ug/dL — ABNORMAL LOW (ref 41–142)
SATURATION RATIOS: 15 % — AB (ref 21–57)
TIBC: 165 ug/dL — AB (ref 236–444)
UIBC: 141 ug/dL

## 2017-10-14 LAB — FERRITIN: FERRITIN: 2509 ng/mL — AB (ref 11–307)

## 2017-10-14 NOTE — Assessment & Plan Note (Signed)
She has persistent anemia although it is improving However, family members felt that it could be related to vitamin deficiency I will order serum vitamin B12 and iron studies for further evaluation

## 2017-10-14 NOTE — Assessment & Plan Note (Signed)
She continues to have intermittent right upper quadrant pain but is not taking pain medicine consistently I encouraged her to take pain medicine as needed

## 2017-10-14 NOTE — Assessment & Plan Note (Signed)
She continues to have persistent hypomagnesemia despite oral magnesium replacement therapy I recommend IV magnesium as well

## 2017-10-14 NOTE — Assessment & Plan Note (Signed)
She continues to have vague right upper quadrant pain and nausea Last CT imaging showed fatty liver disease Recent ultrasound showed no evidence of gallbladder disease Her liver enzymes are normal However, due to her persistent symptoms and overall decline in performance status including weight loss, I plan to repeat imaging study next week for further follow-up

## 2017-10-14 NOTE — Progress Notes (Signed)
[place] OFFICE PROGRESS NOTE  Patient Care Team: Lennie Odor, PA-C as PCP - General (Nurse Practitioner) Arvella Nigh, MD as Obstetrician (Obstetrics and Gynecology) Evans Lance, MD as Consulting Physician (Cardiology)  ASSESSMENT & PLAN:  Cancer of unknown origin [facility]) According to family members, the patient has significant decline in overall performance status She has intermittent pain but is not taking pain medicine consistently She has lost some weight due to poor oral intake secondary to nausea She does not mobilize due to lack of motivation and chronic fatigue I will order additional studies including thyroid function test and plan to repeat CT scan next week instead of next month due to her overall decline  Metastasis to liver [facility]) She continues to have vague right upper quadrant pain and nausea Last CT imaging showed fatty liver disease Recent ultrasound showed no evidence of gallbladder disease Her liver enzymes are normal However, due to her persistent symptoms and overall decline in performance status including weight loss, I plan to repeat imaging study next week for further follow-up  Anemia due to antineoplastic chemotherapy She has persistent anemia although it is improving However, family members felt that it could be related to vitamin deficiency I will order serum vitamin B12 and iron studies for further evaluation  Cancer associated pain She continues to have intermittent right upper quadrant pain but is not taking pain medicine consistently I encouraged her to take pain medicine as needed  Hypomagnesemia She continues to have persistent hypomagnesemia despite oral magnesium replacement therapy I recommend IV magnesium as well  Nausea with vomiting Her nausea is stable with current prescription antiemetics We will continue the same  Physical deconditioning She has significant generalized deconditioning She did not enjoy her trip  to the beach Family members are disappointed that she spent most of the time sleeping and not exercising as discussed in previous visit She was recommended to go to cancer rehabilitation for physical therapy and the patient has not been doing that as well To give the patient the benefit of the doubt, I will order additional studies including imaging study However, if all the test results were negative for cancer progression or nutritional deficiency or treatable causes, I suspect most of her symptoms of physical decline/deconditioning is due to lack of motivation/depression We will revisit this issue again after CT imaging results are available   Orders Placed This Encounter  Procedures  . TSH    Standing Status:   Future    Number of Occurrences:   1    Standing Expiration Date:   11/17/2018  . Vitamin B12    Standing Status:   Future    Number of Occurrences:   1    Standing Expiration Date:   11/17/2018  . Iron and TIBC    Standing Status:   Future    Number of Occurrences:   1    Standing Expiration Date:   11/17/2018  . Ferritin    Standing Status:   Future    Number of Occurrences:   1    Standing Expiration Date:   11/17/2018    INTERVAL HISTORY: Please see below for problem oriented charting. She returns with her daughter and husband for further follow-up According to family members, she has significant decline since the last time she was seen She spent most of her time checking her phone, spending time in bed and only mobilized from bedroom to the kitchen and bathroom With her recent vacation to the beach,  she did not interact much with family members She eats very little due to chronic nausea and lack of appetite She has lost some weight She continues to have intermittent right upper quadrant pain but is not taking pain medicine consistently She denies constipation She has not participated with physical therapy and rehabilitation Family members are worried that her  symptoms could be due to either depression or disease progression or nutritional deficiencies such as vitamin B12 or iron deficiency  SUMMARY OF ONCOLOGIC HISTORY: Oncology History   Biothernostics: 96% probability cervical cancer  Additional evaluation revealed PTEN deletion, negative for MET amplification, PD-L1 expression of 60     Metastasis to liver ([place])   03/03/2017 Initial Diagnosis    Metastasis to liver ([place])    08/08/2017 -  Chemotherapy    The patient had bevacizumab (AVASTIN) 1,800 mg in sodium chloride 0.9 % 100 mL chemo infusion, 15 mg/kg = 1,800 mg, Intravenous,  Once, 4 of 5 cycles Administration: 1,800 mg (08/09/2017), 1,800 mg (09/01/2017), 1,800 mg (09/22/2017), 1,800 mg (10/13/2017)  for chemotherapy treatment.      Cancer of unknown origin ([place])   03/03/2017 Imaging    1. Multifocal liver metastasis. 2. Left posterior pelvic mass extending into the obturator foramen measures up to 7 cm. Suspicious for malignancy. This should be easily amendable to percutaneous tissue sampling under image guidance. 3. No lytic bone lesions identified of myeloma.    03/09/2017 Procedure    Technically successful ultrasound-guided core liver lesion biopsy    03/09/2017 Pathology Results    A. LIVER LESION, RIGHT LOBE; ULTRASOUND-GUIDED BIOPSY:  - METASTATIC SQUAMOUS CELL CARCINOMA, SEE COMMENT.   Comment: Metastatic carcinoma is immunoreactive for p40, p16, p53 and pancytokeratin while CK7, CK20, and TTF1 are negative. Significance of p16 positivity depends on the site of origin and the pattern of immunoreactivity in this material is non-specific. Possible sites of origin include gynecologic, anal, lung, and head/neck. Correlation with physical exam and additional imaging is required.     03/18/2017 Pathology Results    She had GYN exam and pap smear. Result is negative for malignancy    03/21/2017 - 04/01/2017 Hospital Admission    She was admitted to the hospital for pain management  and had radiation therapy    03/23/2017 Procedure    Ultrasound and fluoroscopically guided right internal jugular single lumen power port catheter insertion. Tip in the SVC/RA junction. Catheter ready for use.    05/16/2017 Imaging    Mild decrease in size of left posterior pelvic soft tissue mass and liver metastases. No new or progressive metastatic disease identified.  Stable hepatic steatosis and tiny nonobstructing right renal calculus.    08/08/2017 Imaging    Decreased liver metastases.  Decreased size of left internal iliac mass or lymphadenopathy, which abuts the left sacrum.  No new or progressive metastatic disease.  Stable severe hepatic steatosis.    08/08/2017 -  Chemotherapy    The patient had bevacizumab (AVASTIN) 1,800 mg in sodium chloride 0.9 % 100 mL chemo infusion, 15 mg/kg = 1,800 mg, Intravenous,  Once, 4 of 5 cycles Administration: 1,800 mg (08/09/2017), 1,800 mg (09/01/2017), 1,800 mg (09/22/2017), 1,800 mg (10/13/2017)  for chemotherapy treatment.      Cervical cancer ([place])   07/20/2017 Initial Diagnosis    Cervical cancer ([place])    08/08/2017 -  Chemotherapy    The patient had bevacizumab (AVASTIN) 1,800 mg in sodium chloride 0.9 % 100 mL chemo infusion, 15 mg/kg = 1,800 mg, Intravenous,  Once, 4 of 5 cycles Administration: 1,800 mg (08/09/2017), 1,800 mg (09/01/2017), 1,800 mg (09/22/2017), 1,800 mg (10/13/2017)  for chemotherapy treatment.      REVIEW OF SYSTEMS:   Constitutional: Denies fevers, chills  Eyes: Denies blurriness of vision Ears, nose, mouth, throat, and face: Denies mucositis or sore throat Respiratory: Denies cough, dyspnea or wheezes Cardiovascular: Denies palpitation, chest discomfort or lower extremity swelling Skin: Denies abnormal skin rashes Lymphatics: Denies new lymphadenopathy or easy bruising All other systems were reviewed with the patient and are negative.  I have reviewed the past medical history, past surgical history, social  history and family history with the patient and they are unchanged from previous note.  ALLERGIES:  has No Known Allergies.  MEDICATIONS:  Current Outpatient Medications  Medication Sig Dispense Refill  . ALPRAZolam (XANAX) 0.25 MG tablet Take 1 tablet (0.25 mg total) by mouth 3 (three) times daily as needed for sleep. 60 tablet 0  . atorvastatin (LIPITOR) 40 MG tablet Take 1 tablet (40 mg total) by mouth daily at 6 PM. 30 tablet 0  . cholecalciferol (VITAMIN D) 1000 units tablet Take 2,000 Units by mouth daily.    . citalopram (CELEXA) 20 MG tablet Take 20 mg by mouth daily.    . clonazePAM (KLONOPIN) 0.5 MG tablet Take 1 tablet (0.5 mg total) by mouth daily. 30 tablet 0  . flecainide (TAMBOCOR) 50 MG tablet Take 1 tablet (50 mg total) by mouth daily. 90 tablet 3  . gabapentin (NEURONTIN) 300 MG capsule Take 1-2 capsules (300-600 mg total) by mouth 2 (two) times daily. 300 mg in the am and 600 mg at night 90 capsule 11  . HYDROmorphone (DILAUDID) 4 MG tablet Take 1 tablet (4 mg total) by mouth every 6 (six) hours as needed for severe pain. 60 tablet 0  . lidocaine-prilocaine (EMLA) cream Apply 1 application topically as needed. (Patient taking differently: Apply 1 application topically as needed (port access). ) 30 g 0  . lisinopril (PRINIVIL,ZESTRIL) 20 MG tablet Take 1 tablet (20 mg total) by mouth daily. 90 tablet 3  . magnesium oxide (MAG-OX) 400 (241.3 Mg) MG tablet Take 1 tablet (400 mg total) by mouth 3 (three) times daily. 60 tablet 3  . metFORMIN (GLUCOPHAGE) 500 MG tablet Take 500 mg by mouth 2 (two) times daily.  3  . metoprolol tartrate (LOPRESSOR) 50 MG tablet Take 1 tablet (50 mg total) by mouth 2 (two) times daily. 90 tablet 3  . Omega-3 Fatty Acids (FISH OIL) 1000 MG CAPS Take 2 capsules by mouth daily.      . ondansetron (ZOFRAN) 8 MG tablet Take 1 tablet (8 mg total) by mouth every 8 (eight) hours as needed for nausea. 90 tablet 1  . polyethylene glycol (MIRALAX / GLYCOLAX)  packet Take 17 g by mouth daily. (Patient not taking: Reported on 09/15/2017) 14 each 0  . prochlorperazine (COMPAZINE) 10 MG tablet Take 10 mg by mouth every 6 (six) hours as needed for nausea or vomiting.    . rivaroxaban (XARELTO) 20 MG TABS tablet Take 1 tablet (20 mg total) by mouth daily. 30 tablet 0  . scopolamine (TRANSDERM-SCOP) 1 MG/3DAYS Place 1 patch (1.5 mg total) onto the skin every 3 (three) days. 10 patch 12  . valACYclovir (VALTREX) 1000 MG tablet Take 1 tablet (1,000 mg total) by mouth 3 (three) times daily. 21 tablet 0   No current facility-administered medications for this visit.     PHYSICAL EXAMINATION: ECOG PERFORMANCE STATUS: 2 -  Symptomatic, <50% confined to bed  Vitals:   10/13/17 1414  BP: (!) 120/49  Pulse: (!) 110  Resp: 18  Temp: 99 F (37.2 C)  SpO2: 99%   Filed Weights   10/13/17 1414  Weight: 243 lb 12.8 oz (110.6 kg)    GENERAL:alert, no distress and comfortable SKIN: skin color, texture, turgor are normal, no rashes or significant lesions EYES: normal, Conjunctiva are pink and non-injected, sclera clear OROPHARYNX:no exudate, no erythema and lips, buccal mucosa, and tongue normal  NECK: supple, thyroid normal size, non-tender, without nodularity LYMPH:  no palpable lymphadenopathy in the cervical, axillary or inguinal LUNGS: clear to auscultation and percussion with normal breathing effort HEART: regular rate & rhythm and no murmurs and no lower extremity edema ABDOMEN:abdomen soft, non-tender and normal bowel sounds Musculoskeletal:no cyanosis of digits and no clubbing  NEURO: alert & oriented x 3 with fluent speech, no focal motor/sensory deficits  LABORATORY DATA:  I have reviewed the data as listed    Component Value Date/Time   NA 134 (L) 10/13/2017 1345   K 3.8 10/13/2017 1345   CL 97 (L) 10/13/2017 1345   CO2 26 10/13/2017 1345   GLUCOSE 146 (H) 10/13/2017 1345   BUN 6 10/13/2017 1345   CREATININE 0.71 10/13/2017 1345    CREATININE 0.81 06/28/2017 0851   CALCIUM 9.3 10/13/2017 1345   PROT 7.7 10/13/2017 1345   ALBUMIN 2.8 (L) 10/13/2017 1345   AST 38 10/13/2017 1345   AST 43 (H) 06/28/2017 0851   ALT 43 10/13/2017 1345   ALT 55 06/28/2017 0851   ALKPHOS 119 10/13/2017 1345   BILITOT 0.7 10/13/2017 1345   BILITOT 0.6 06/28/2017 0851   GFRNONAA >60 10/13/2017 1345   GFRNONAA >60 06/28/2017 0851   GFRAA >60 10/13/2017 1345   GFRAA >60 06/28/2017 0851    No results found for: SPEP, UPEP  Lab Results  Component Value Date   WBC 8.9 10/13/2017   NEUTROABS 5.9 10/13/2017   HGB 10.9 (L) 10/13/2017   HCT 34.0 (L) 10/13/2017   MCV 86.7 10/13/2017   PLT  10/13/2017    PLATELET CLUMPS NOTED ON SMEAR, COUNT APPEARS ADEQUATE      Chemistry      Component Value Date/Time   NA 134 (L) 10/13/2017 1345   K 3.8 10/13/2017 1345   CL 97 (L) 10/13/2017 1345   CO2 26 10/13/2017 1345   BUN 6 10/13/2017 1345   CREATININE 0.71 10/13/2017 1345   CREATININE 0.81 06/28/2017 0851      Component Value Date/Time   CALCIUM 9.3 10/13/2017 1345   ALKPHOS 119 10/13/2017 1345   AST 38 10/13/2017 1345   AST 43 (H) 06/28/2017 0851   ALT 43 10/13/2017 1345   ALT 55 06/28/2017 0851   BILITOT 0.7 10/13/2017 1345   BILITOT 0.6 06/28/2017 0851       RADIOGRAPHIC STUDIES: I have personally reviewed the radiological images as listed and agreed with the findings in the report. US Abdomen Complete  Result Date: 09/15/2017 CLINICAL DATA:  Nausea and abdominal pain EXAM: ABDOMEN ULTRASOUND COMPLETE COMPARISON:  CT 08/08/2017 FINDINGS: Gallbladder: No gallstones or wall thickening visualized. No sonographic Murphy sign noted by sonographer. Common bile duct: Diameter: 6.6 mm Liver: Increased hepatic echogenicity. Heterogenous liver masses consistent with metastatic disease. Dominant mass in the subcapsular right lobe measures 7.3 x 5.6 x 7.7 cm. Additional hypoechoic mass in the left lobe measuring 2.3 x 1.9 x 2.3 cm. Portal  vein is patent on  color Doppler imaging with normal direction of blood flow towards the liver. IVC: No abnormality visualized. Pancreas: Visualized portion unremarkable. Spleen: Size and appearance within normal limits. Right Kidney: Length: 12.5 cm. Echogenicity within normal limits. No mass or hydronephrosis visualized. Left Kidney: Length: 12.1 cm. Echogenicity within normal limits. No mass or hydronephrosis visualized. Abdominal aorta: No aneurysm visualized. Other findings: None. IMPRESSION: 1. Negative for gallstones or cholecystitis. Common bile duct diameter upper normal at 6.6 mm 2. Increased hepatic echogenicity consistent with fatty infiltration or hepatocellular disease. Multiple hypoechoic masses consistent with metastatic disease as noted on prior CT. Electronically Signed   By: Donavan Foil M.D.   On: 09/15/2017 19:16    All questions were answered. The patient knows to call the clinic with any problems, questions or concerns. No barriers to learning was detected.  I spent 40 minutes counseling the patient face to face. The total time spent in the appointment was 55 minutes and more than 50% was on counseling and review of test results  [name], MD 10/14/2017 9:11 AM

## 2017-10-14 NOTE — Telephone Encounter (Signed)
Per 8/23 los, no new orders or referrals.

## 2017-10-14 NOTE — Assessment & Plan Note (Signed)
Her nausea is stable with current prescription antiemetics We will continue the same

## 2017-10-14 NOTE — Assessment & Plan Note (Signed)
According to family members, the patient has significant decline in overall performance status She has intermittent pain but is not taking pain medicine consistently She has lost some weight due to poor oral intake secondary to nausea She does not mobilize due to lack of motivation and chronic fatigue I will order additional studies including thyroid function test and plan to repeat CT scan next week instead of next month due to her overall decline

## 2017-10-14 NOTE — Telephone Encounter (Signed)
No note

## 2017-10-14 NOTE — Assessment & Plan Note (Signed)
She has significant generalized deconditioning She did not enjoy her trip to the beach Family members are disappointed that she spent most of the time sleeping and not exercising as discussed in previous visit She was recommended to go to cancer rehabilitation for physical therapy and the patient has not been doing that as well To give the patient the benefit of the doubt, I will order additional studies including imaging study However, if all the test results were negative for cancer progression or nutritional deficiency or treatable causes, I suspect most of her symptoms of physical decline/deconditioning is due to lack of motivation/depression We will revisit this issue again after CT imaging results are available

## 2017-10-17 ENCOUNTER — Other Ambulatory Visit: Payer: Self-pay | Admitting: Hematology and Oncology

## 2017-10-18 DIAGNOSIS — C801 Malignant (primary) neoplasm, unspecified: Secondary | ICD-10-CM | POA: Diagnosis not present

## 2017-10-19 ENCOUNTER — Telehealth: Payer: Self-pay | Admitting: Hematology and Oncology

## 2017-10-19 ENCOUNTER — Telehealth: Payer: Self-pay

## 2017-10-19 NOTE — Telephone Encounter (Signed)
Called regarding below message. She verbalized understanding. Scheduling message sent for Fridays appt. She questioned appt date and times. Given dates and times of upcoming appts.

## 2017-10-19 NOTE — Telephone Encounter (Signed)
Scheduled appt on 8/30 patient aware of appt date and time - per NG okay with f/u after iron .

## 2017-10-19 NOTE — Telephone Encounter (Signed)
-----   Message from [name], MD sent at 10/19/2017  8:33 AM EDT ----- Regarding: review CT She is scheduled for CT tomorrow and IV iron early Friday I can see her Friday at 1230 pm to review results If ok with her, send scheduling msg

## 2017-10-20 ENCOUNTER — Ambulatory Visit: Payer: 59 | Admitting: Internal Medicine

## 2017-10-20 ENCOUNTER — Ambulatory Visit (HOSPITAL_COMMUNITY)
Admission: RE | Admit: 2017-10-20 | Discharge: 2017-10-20 | Disposition: A | Discharge status: Home / Self Care | Payer: 59 | Source: Ambulatory Visit | Attending: Hematology and Oncology | Admitting: Hematology and Oncology

## 2017-10-20 DIAGNOSIS — K760 Fatty (change of) liver, not elsewhere classified: Secondary | ICD-10-CM | POA: Diagnosis not present

## 2017-10-20 DIAGNOSIS — C760 Malignant neoplasm of head, face and neck: Secondary | ICD-10-CM | POA: Diagnosis not present

## 2017-10-20 DIAGNOSIS — I700 Atherosclerosis of aorta: Secondary | ICD-10-CM | POA: Insufficient documentation

## 2017-10-20 DIAGNOSIS — R112 Nausea with vomiting, unspecified: Secondary | ICD-10-CM | POA: Insufficient documentation

## 2017-10-20 DIAGNOSIS — C787 Secondary malignant neoplasm of liver and intrahepatic bile duct: Secondary | ICD-10-CM | POA: Insufficient documentation

## 2017-10-20 DIAGNOSIS — C539 Malignant neoplasm of cervix uteri, unspecified: Secondary | ICD-10-CM | POA: Insufficient documentation

## 2017-10-20 DIAGNOSIS — C801 Malignant (primary) neoplasm, unspecified: Secondary | ICD-10-CM

## 2017-10-20 MED ORDER — HEPARIN SOD (PORK) LOCK FLUSH 100 UNIT/ML IV SOLN
INTRAVENOUS | Status: AC
  Filled 2017-10-20: qty 5

## 2017-10-20 MED ORDER — HEPARIN SOD (PORK) LOCK FLUSH 100 UNIT/ML IV SOLN: 500.0000 [IU] | Freq: Once | INTRAVENOUS | Status: DC

## 2017-10-20 MED ORDER — IOHEXOL 300 MG/ML  SOLN
100.0000 mL | Freq: Once | INTRAMUSCULAR | Status: AC | PRN
  Administered 2017-10-20: 100 mL via INTRAVENOUS

## 2017-10-21 ENCOUNTER — Inpatient Hospital Stay (HOSPITAL_BASED_OUTPATIENT_CLINIC_OR_DEPARTMENT_OTHER): Payer: 59 | Admitting: Hematology and Oncology

## 2017-10-21 ENCOUNTER — Inpatient Hospital Stay: Payer: 59

## 2017-10-21 ENCOUNTER — Telehealth: Payer: Self-pay | Admitting: *Deleted

## 2017-10-21 VITALS — BP 122/94 | HR 97 | Temp 98.0°F | Resp 17

## 2017-10-21 VITALS — BP 136/88 | HR 113 | Temp 98.0°F | Resp 12 | Ht 67.0 in | Wt 237.6 lb

## 2017-10-21 DIAGNOSIS — E860 Dehydration: Secondary | ICD-10-CM | POA: Diagnosis not present

## 2017-10-21 DIAGNOSIS — C539 Malignant neoplasm of cervix uteri, unspecified: Secondary | ICD-10-CM

## 2017-10-21 DIAGNOSIS — Z5112 Encounter for antineoplastic immunotherapy: Secondary | ICD-10-CM | POA: Diagnosis not present

## 2017-10-21 DIAGNOSIS — D6481 Anemia due to antineoplastic chemotherapy: Secondary | ICD-10-CM | POA: Diagnosis not present

## 2017-10-21 DIAGNOSIS — C801 Malignant (primary) neoplasm, unspecified: Secondary | ICD-10-CM

## 2017-10-21 DIAGNOSIS — Z95828 Presence of other vascular implants and grafts: Secondary | ICD-10-CM

## 2017-10-21 DIAGNOSIS — I6330 Cerebral infarction due to thrombosis of unspecified cerebral artery: Secondary | ICD-10-CM

## 2017-10-21 DIAGNOSIS — G893 Neoplasm related pain (acute) (chronic): Secondary | ICD-10-CM | POA: Diagnosis not present

## 2017-10-21 DIAGNOSIS — R634 Abnormal weight loss: Secondary | ICD-10-CM | POA: Diagnosis not present

## 2017-10-21 DIAGNOSIS — R112 Nausea with vomiting, unspecified: Secondary | ICD-10-CM

## 2017-10-21 DIAGNOSIS — Z7189 Other specified counseling: Secondary | ICD-10-CM

## 2017-10-21 DIAGNOSIS — C787 Secondary malignant neoplasm of liver and intrahepatic bile duct: Secondary | ICD-10-CM

## 2017-10-21 MED ORDER — HEPARIN SOD (PORK) LOCK FLUSH 100 UNIT/ML IV SOLN
500.0000 [IU] | Freq: Once | INTRAVENOUS | Status: AC | PRN
  Administered 2017-10-21: 500 [IU]
  Filled 2017-10-21: qty 5

## 2017-10-21 MED ORDER — DRONABINOL 5 MG PO CAPS: 5.0000 mg | ORAL_CAPSULE | Freq: Two times a day (BID) | ORAL | 0 refills | Status: DC

## 2017-10-21 MED ORDER — SODIUM CHLORIDE 0.9 % IV SOLN
510.0000 mg | Freq: Once | INTRAVENOUS | Status: AC
  Administered 2017-10-21: 510 mg via INTRAVENOUS
  Filled 2017-10-21: qty 17

## 2017-10-21 MED ORDER — SODIUM CHLORIDE 0.9 % IV SOLN
Freq: Once | INTRAVENOUS | Status: DC
  Filled 2017-10-21: qty 250

## 2017-10-21 MED ORDER — SODIUM CHLORIDE 0.9 % IV SOLN
Freq: Once | INTRAVENOUS | Status: AC
  Administered 2017-10-21: 10:00:00 via INTRAVENOUS
  Filled 2017-10-21: qty 250

## 2017-10-21 MED ORDER — SODIUM CHLORIDE 0.9% FLUSH
10.0000 mL | Freq: Once | INTRAVENOUS | Status: AC | PRN
  Administered 2017-10-21: 10 mL
  Filled 2017-10-21: qty 10

## 2017-10-21 MED ORDER — PROMETHAZINE HCL 25 MG/ML IJ SOLN: 25.0000 mg | Freq: Once | INTRAMUSCULAR | Status: DC

## 2017-10-21 MED FILL — DRONABINOL 5 MG CAP: 5 | 30 days supply | Qty: 60 | Fill #0

## 2017-10-21 NOTE — Telephone Encounter (Signed)
Notified of message below

## 2017-10-21 NOTE — Patient Instructions (Signed)

## 2017-10-21 NOTE — Telephone Encounter (Signed)
-----   Message from [name], MD sent at 10/21/2017  1:00 PM EDT ----- Regarding: CT scan Tell husband indeed scan is as discussed; liver mets worse, pelvic mass better ----- Message ----- From: Interface, Rad Results In Sent: 10/21/2017  12:04 PM EDT To: [name], MD

## 2017-10-22 ENCOUNTER — Encounter: Payer: Self-pay | Admitting: Hematology and Oncology

## 2017-10-22 NOTE — Progress Notes (Signed)
[facility] OFFICE PROGRESS NOTE  Patient Care Team: Lennie Odor, PA-C as PCP - General (Nurse Practitioner) Arvella Nigh, MD as Obstetrician (Obstetrics and Gynecology) Evans Lance, MD as Consulting Physician (Cardiology)  ASSESSMENT & PLAN:  Cancer of unknown origin [facility]) She continues to have significant deterioration of performance status especially with poor oral appetite and profound nausea Due to evidence of disease progression on CT imaging, I plan to order MRI of the brain to exclude intracranial metastasis I will see her back next week for further follow-up and evaluation of symptoms I will discontinue bevacizumab and switch her future treatment to pembrolizumab Previous additional testing of PD-L1 was strongly positive  Metastasis to liver [facility]) Unfortunately, CT imaging show evidence of progression of liver metastasis As above, I plan to switch her to treatment in the future  Nausea with vomiting She continues to have uncontrolled nausea and vomiting despite minimal oral intake and not being on chemotherapy I recommend she continues on scopolamine patch I recommend scheduled Zofran and benzodiazepine as needed I recommend Marinol I plan to order MRI of the brain to exclude intracranial metastasis I recommend IV fluids and IV antiemetics at home for supportive care  Cerebral thrombosis with cerebral infarction She had history of stroke in the past and due to her uncontrolled symptoms, I plan to order MRI of the brain to exclude intracranial metastasis  Goals of care, counseling/discussion We had extensive goals of care discussion Family members felt that the patient is depressed and not motivated and somewhat noncompliant with medications We had extensive discussion about the goals of care and ultimately, the patient is willing to ask for help and take her medications as instructed   Orders Placed This Encounter  Procedures  . MR Brain W Wo  Contrast    Standing Status:   Future    Standing Expiration Date:   10/21/2018    Order Specific Question:   If indicated for the ordered procedure, I authorize the administration of contrast media per Radiology protocol    Answer:   Yes    Order Specific Question:   What is the patient's sedation requirement?    Answer:   No Sedation    Order Specific Question:   Does the patient have a pacemaker or implanted devices?    Answer:   No    Order Specific Question:   Use SRS Protocol?    Answer:   No    Order Specific Question:   Radiology Contrast Protocol - do NOT remove file path    Answer:   [email or web address]    Order Specific Question:   Preferred imaging location?    Answer:   [facility] (table limit-350 lbs)  . TSH    Standing Status:   Standing    Number of Occurrences:   9    Standing Expiration Date:   10/23/2018    INTERVAL HISTORY: Please see below for problem oriented charting. She returns with her husband to review of CT imaging She continues to have profound nausea despite minimal oral intake She denies constipation According to her husband, her intake amount to a cup food of food per day She also have poor oral fluid intake and felt dehydrated and dizzy Husband also noted that she has lack of motivation to engage in activities of daily living She has intermittent right upper quadrant discomfort but denies excessive pain and has not been taking pain medicine on a regular basis  SUMMARY OF ONCOLOGIC  HISTORY: Oncology History   Biothernostics: 96% probability cervical cancer  Additional evaluation revealed PTEN deletion, negative for MET amplification, PD-L1 expression of 60     Metastasis to liver ([place])   03/03/2017 Initial Diagnosis    Metastasis to liver ([place])    08/08/2017 - 11/02/2017 Chemotherapy    The patient had bevacizumab (AVASTIN) 1,800 mg in sodium chloride 0.9 % 100 mL chemo infusion, 15 mg/kg = 1,800 mg, Intravenous,   Once, 4 of 5 cycles Administration: 1,800 mg (08/09/2017), 1,800 mg (09/01/2017), 1,800 mg (09/22/2017), 1,800 mg (10/13/2017)  for chemotherapy treatment.     10/30/2017 -  Chemotherapy    The patient had pembrolizumab (KEYTRUDA) 200 mg in sodium chloride 0.9 % 50 mL chemo infusion, 200 mg, Intravenous, Once, 0 of 6 cycles  for chemotherapy treatment.      Cancer of unknown origin ([place])   03/03/2017 Imaging    1. Multifocal liver metastasis. 2. Left posterior pelvic mass extending into the obturator foramen measures up to 7 cm. Suspicious for malignancy. This should be easily amendable to percutaneous tissue sampling under image guidance. 3. No lytic bone lesions identified of myeloma.    03/09/2017 Procedure    Technically successful ultrasound-guided core liver lesion biopsy    03/09/2017 Pathology Results    A. LIVER LESION, RIGHT LOBE; ULTRASOUND-GUIDED BIOPSY:  - METASTATIC SQUAMOUS CELL CARCINOMA, SEE COMMENT.   Comment: Metastatic carcinoma is immunoreactive for p40, p16, p53 and pancytokeratin while CK7, CK20, and TTF1 are negative. Significance of p16 positivity depends on the site of origin and the pattern of immunoreactivity in this material is non-specific. Possible sites of origin include gynecologic, anal, lung, and head/neck. Correlation with physical exam and additional imaging is required.     03/18/2017 Pathology Results    She had GYN exam and pap smear. Result is negative for malignancy    03/21/2017 - 04/01/2017 Hospital Admission    She was admitted to the hospital for pain management and had radiation therapy    03/23/2017 Procedure    Ultrasound and fluoroscopically guided right internal jugular single lumen power port catheter insertion. Tip in the SVC/RA junction. Catheter ready for use.    05/16/2017 Imaging    Mild decrease in size of left posterior pelvic soft tissue mass and liver metastases. No new or progressive metastatic disease identified.  Stable hepatic  steatosis and tiny nonobstructing right renal calculus.    08/08/2017 Imaging    Decreased liver metastases.  Decreased size of left internal iliac mass or lymphadenopathy, which abuts the left sacrum.  No new or progressive metastatic disease.  Stable severe hepatic steatosis.    08/08/2017 - 11/02/2017 Chemotherapy    The patient had bevacizumab (AVASTIN) 1,800 mg in sodium chloride 0.9 % 100 mL chemo infusion, 15 mg/kg = 1,800 mg, Intravenous,  Once, 4 of 5 cycles Administration: 1,800 mg (08/09/2017), 1,800 mg (09/01/2017), 1,800 mg (09/22/2017), 1,800 mg (10/13/2017)  for chemotherapy treatment.     10/21/2017 Imaging    1. Interval progression of liver metastases. 2. Poorly marginated left pelvic side wall soft tissue mass is mildly decreased in size. 3. Diffuse hepatic steatosis. 4. Aortic Atherosclerosis (ICD10-I70.0).    10/30/2017 -  Chemotherapy    The patient had pembrolizumab (KEYTRUDA) 200 mg in sodium chloride 0.9 % 50 mL chemo infusion, 200 mg, Intravenous, Once, 0 of 6 cycles  for chemotherapy treatment.      Cervical cancer ([place])   07/20/2017 Initial Diagnosis    Cervical cancer ([facility])  08/08/2017 - 11/02/2017 Chemotherapy    The patient had bevacizumab (AVASTIN) 1,800 mg in sodium chloride 0.9 % 100 mL chemo infusion, 15 mg/kg = 1,800 mg, Intravenous,  Once, 4 of 5 cycles Administration: 1,800 mg (08/09/2017), 1,800 mg (09/01/2017), 1,800 mg (09/22/2017), 1,800 mg (10/13/2017)  for chemotherapy treatment.     10/30/2017 -  Chemotherapy    The patient had pembrolizumab (KEYTRUDA) 200 mg in sodium chloride 0.9 % 50 mL chemo infusion, 200 mg, Intravenous, Once, 0 of 6 cycles  for chemotherapy treatment.      REVIEW OF SYSTEMS:   Constitutional: Denies fevers, chills or abnormal weight loss Eyes: Denies blurriness of vision Ears, nose, mouth, throat, and face: Denies mucositis or sore throat Respiratory: Denies cough, dyspnea or wheezes Cardiovascular: Denies palpitation,  chest discomfort or lower extremity swelling Skin: Denies abnormal skin rashes Lymphatics: Denies new lymphadenopathy or easy bruising Behavioral/Psych: Mood is stable, no new changes  All other systems were reviewed with the patient and are negative.  I have reviewed the past medical history, past surgical history, social history and family history with the patient and they are unchanged from previous note.  ALLERGIES:  has No Known Allergies.  MEDICATIONS:  Current Outpatient Medications  Medication Sig Dispense Refill  . ALPRAZolam (XANAX) 0.25 MG tablet Take 1 tablet (0.25 mg total) by mouth 3 (three) times daily as needed for sleep. 60 tablet 0  . atorvastatin (LIPITOR) 40 MG tablet Take 1 tablet (40 mg total) by mouth daily at 6 PM. 30 tablet 0  . cholecalciferol (VITAMIN D) 1000 units tablet Take 2,000 Units by mouth daily.    . citalopram (CELEXA) 20 MG tablet Take 20 mg by mouth daily.    . clonazePAM (KLONOPIN) 0.5 MG tablet Take 1 tablet (0.5 mg total) by mouth daily. 30 tablet 0  . dronabinol (MARINOL) 5 MG capsule Take 1 capsule (5 mg total) by mouth 2 (two) times daily before lunch and supper. 60 capsule 0  . flecainide (TAMBOCOR) 50 MG tablet Take 1 tablet (50 mg total) by mouth daily. 90 tablet 3  . gabapentin (NEURONTIN) 300 MG capsule Take 1-2 capsules (300-600 mg total) by mouth 2 (two) times daily. 300 mg in the am and 600 mg at night 90 capsule 11  . HYDROmorphone (DILAUDID) 4 MG tablet Take 1 tablet (4 mg total) by mouth every 6 (six) hours as needed for severe pain. 60 tablet 0  . lidocaine-prilocaine (EMLA) cream Apply 1 application topically as needed. (Patient taking differently: Apply 1 application topically as needed (port access). ) 30 g 0  . lisinopril (PRINIVIL,ZESTRIL) 20 MG tablet Take 1 tablet (20 mg total) by mouth daily. 90 tablet 3  . magnesium oxide (MAG-OX) 400 (241.3 Mg) MG tablet Take 1 tablet (400 mg total) by mouth 3 (three) times daily. 60 tablet 3   . metFORMIN (GLUCOPHAGE) 500 MG tablet Take 500 mg by mouth 2 (two) times daily.  3  . metoprolol tartrate (LOPRESSOR) 50 MG tablet Take 1 tablet (50 mg total) by mouth 2 (two) times daily. 90 tablet 3  . Omega-3 Fatty Acids (FISH OIL) 1000 MG CAPS Take 2 capsules by mouth daily.      . ondansetron (ZOFRAN) 8 MG tablet Take 1 tablet (8 mg total) by mouth every 8 (eight) hours as needed for nausea. 90 tablet 1  . polyethylene glycol (MIRALAX / GLYCOLAX) packet Take 17 g by mouth daily. (Patient not taking: Reported on 09/15/2017) 14 each 0  .  prochlorperazine (COMPAZINE) 10 MG tablet Take 10 mg by mouth every 6 (six) hours as needed for nausea or vomiting.    . rivaroxaban (XARELTO) 20 MG TABS tablet Take 1 tablet (20 mg total) by mouth daily. 30 tablet 0  . scopolamine (TRANSDERM-SCOP) 1 MG/3DAYS Place 1 patch (1.5 mg total) onto the skin every 3 (three) days. 10 patch 12  . valACYclovir (VALTREX) 1000 MG tablet Take 1 tablet (1,000 mg total) by mouth 3 (three) times daily. 21 tablet 0   No current facility-administered medications for this visit.     PHYSICAL EXAMINATION: ECOG PERFORMANCE STATUS: 2 - Symptomatic, <50% confined to bed  Vitals:   10/21/17 1155  BP: 136/88  Pulse: (!) 113  Resp: 12  Temp: 98 F (36.7 C)  SpO2: 100%   Filed Weights   10/21/17 1155  Weight: 237 lb 9.6 oz (107.8 kg)    GENERAL:alert, no distress and comfortable NEURO: alert & oriented x 3 with fluent speech, no focal motor/sensory deficits  LABORATORY DATA:  I have reviewed the data as listed    Component Value Date/Time   NA 134 (L) 10/13/2017 1345   K 3.8 10/13/2017 1345   CL 97 (L) 10/13/2017 1345   CO2 26 10/13/2017 1345   GLUCOSE 146 (H) 10/13/2017 1345   BUN 6 10/13/2017 1345   CREATININE 0.71 10/13/2017 1345   CREATININE 0.81 06/28/2017 0851   CALCIUM 9.3 10/13/2017 1345   PROT 7.7 10/13/2017 1345   ALBUMIN 2.8 (L) 10/13/2017 1345   AST 38 10/13/2017 1345   AST 43 (H) 06/28/2017  0851   ALT 43 10/13/2017 1345   ALT 55 06/28/2017 0851   ALKPHOS 119 10/13/2017 1345   BILITOT 0.7 10/13/2017 1345   BILITOT 0.6 06/28/2017 0851   GFRNONAA >60 10/13/2017 1345   GFRNONAA >60 06/28/2017 0851   GFRAA >60 10/13/2017 1345   GFRAA >60 06/28/2017 0851    No results found for: SPEP, UPEP  Lab Results  Component Value Date   WBC 8.9 10/13/2017   NEUTROABS 5.9 10/13/2017   HGB 10.9 (L) 10/13/2017   HCT 34.0 (L) 10/13/2017   MCV 86.7 10/13/2017   PLT  10/13/2017    PLATELET CLUMPS NOTED ON SMEAR, COUNT APPEARS ADEQUATE      Chemistry      Component Value Date/Time   NA 134 (L) 10/13/2017 1345   K 3.8 10/13/2017 1345   CL 97 (L) 10/13/2017 1345   CO2 26 10/13/2017 1345   BUN 6 10/13/2017 1345   CREATININE 0.71 10/13/2017 1345   CREATININE 0.81 06/28/2017 0851      Component Value Date/Time   CALCIUM 9.3 10/13/2017 1345   ALKPHOS 119 10/13/2017 1345   AST 38 10/13/2017 1345   AST 43 (H) 06/28/2017 0851   ALT 43 10/13/2017 1345   ALT 55 06/28/2017 0851   BILITOT 0.7 10/13/2017 1345   BILITOT 0.6 06/28/2017 0851       RADIOGRAPHIC STUDIES: I have reviewed extensive imaging studies with the patient and family I have personally reviewed the radiological images as listed and agreed with the findings in the report. Ct Abdomen Pelvis W Contrast  Result Date: 10/21/2017 CLINICAL DATA:  Metastatic cervical cancer. Restaging. History of chemotherapy and radiation therapy. EXAM: CT ABDOMEN AND PELVIS WITH CONTRAST TECHNIQUE: Multidetector CT imaging of the abdomen and pelvis was performed using the standard protocol following bolus administration of intravenous contrast. CONTRAST:  171m OMNIPAQUE IOHEXOL 300 MG/ML  SOLN COMPARISON:  08/08/2017  CT abdomen/pelvis. FINDINGS: Lower chest: Mild scarring versus atelectasis the right lung base. Hepatobiliary: Diffuse hepatic steatosis. Multiple (at least 5) heterogeneously hyperenhancing poorly marginated liver masses  scattered throughout the liver, increased in size and number, with representative masses as follows: -dominant segment 5 right liver lobe 11.8 x 5.2 cm mass (series 2/image 31), increased from 8.0 x 3.4 cm -anterior segment 4A left liver lobe 2.4 x 2.4 cm mass (series 2/image 21), increased from 0.9 x 0.7 cm -segment 8 right liver lobe 1.9 x 1.7 cm mass (series 2/image 16), new -inferior segment 5 right liver lobe 2.2 x 2.0 cm mass (series 2/image 45), new Normal gallbladder with no radiopaque cholelithiasis. No biliary ductal dilatation. Pancreas: Normal, with no mass or duct dilation. Spleen: Normal size. No mass. Adrenals/Urinary Tract: Normal adrenals. Punctate nonobstructing interpolar right renal stone. No hydronephrosis. Scattered subcentimeter hypodense renal cortical lesions in both kidneys are too small to characterize and not appreciably changed. No new renal lesions. Collapsed normal bladder. Stomach/Bowel: Normal non-distended stomach. Normal caliber small bowel with no small bowel wall thickening. Normal appendix. Normal large bowel with no diverticulosis, large bowel wall thickening or pericolonic fat stranding. Vascular/Lymphatic: Atherosclerotic nonaneurysmal abdominal aorta. Patent portal, splenic, hepatic and renal veins. No pathologically enlarged lymph nodes in the abdomen or pelvis. Reproductive: No adnexal mass. Stable mildly enlarged anteverted uterus. No discrete mass in the cervix by CT. Other: No pneumoperitoneum, ascites or focal fluid collection. Left pelvic sidewall 4.9 x 2.0 cm poorly marginated soft tissue mass (series 2/image 81), previously 4.8 x 2.9 cm, mildly decreased. Musculoskeletal: No aggressive appearing focal osseous lesions. Mild thoracolumbar spondylosis. IMPRESSION: 1. Interval progression of liver metastases. 2. Poorly marginated left pelvic side wall soft tissue mass is mildly decreased in size. 3. Diffuse hepatic steatosis. 4.  Aortic Atherosclerosis (ICD10-I70.0).  Electronically Signed   By: Ilona Sorrel M.D.   On: 10/21/2017 12:02    All questions were answered. The patient knows to call the clinic with any problems, questions or concerns. No barriers to learning was detected.  I spent 40 minutes counseling the patient face to face. The total time spent in the appointment was 55 minutes and more than 50% was on counseling and review of test results  [name], MD 10/22/2017 6:28 AM

## 2017-10-22 NOTE — Assessment & Plan Note (Signed)
We had extensive goals of care discussion Family members felt that the patient is depressed and not motivated and somewhat noncompliant with medications We had extensive discussion about the goals of care and ultimately, the patient is willing to ask for help and take her medications as instructed

## 2017-10-22 NOTE — Assessment & Plan Note (Addendum)
She continues to have significant deterioration of performance status especially with poor oral appetite and profound nausea Due to evidence of disease progression on CT imaging, I plan to order MRI of the brain to exclude intracranial metastasis I will see her back next week for further follow-up and evaluation of symptoms I will discontinue bevacizumab and switch her future treatment to pembrolizumab Previous additional testing of PD-L1 was strongly positive 

## 2017-10-22 NOTE — Assessment & Plan Note (Signed)
Unfortunately, CT imaging show evidence of progression of liver metastasis As above, I plan to switch her to treatment in the future

## 2017-10-22 NOTE — Assessment & Plan Note (Signed)
She continues to have uncontrolled nausea and vomiting despite minimal oral intake and not being on chemotherapy I recommend she continues on scopolamine patch I recommend scheduled Zofran and benzodiazepine as needed I recommend Marinol I plan to order MRI of the brain to exclude intracranial metastasis I recommend IV fluids and IV antiemetics at home for supportive care

## 2017-10-22 NOTE — Progress Notes (Signed)
DISCONTINUE OFF PATHWAY REGIMEN - [Other Dx]   [id number]:Carboplatin + Paclitaxel (5/175) q21 Days:   A cycle is every 21 days:     Paclitaxel      Carboplatin   **Always confirm dose/schedule in your pharmacy ordering system**  REASON: Disease Progression PRIOR TREATMENT: Carboplatin + Paclitaxel (5/175) q21 Days TREATMENT RESPONSE: Progressive Disease (PD)  START OFF PATHWAY REGIMEN - [Other Dx]   [id number]:Pembrolizumab 200 mg q21 Days:   A cycle is 21 days:     Pembrolizumab   **Always confirm dose/schedule in your pharmacy ordering system**  Patient Characteristics: Intent of Therapy: Non-Curative / Palliative Intent, Discussed with Patient

## 2017-10-22 NOTE — Assessment & Plan Note (Signed)
She had history of stroke in the past and due to her uncontrolled symptoms, I plan to order MRI of the brain to exclude intracranial metastasis

## 2017-10-24 ENCOUNTER — Emergency Department (HOSPITAL_COMMUNITY)
Admission: EM | Admit: 2017-10-24 | Discharge: 2017-10-24 | Disposition: A | Discharge status: Home / Self Care | Payer: 59 | Attending: Emergency Medicine | Admitting: Emergency Medicine

## 2017-10-24 ENCOUNTER — Emergency Department (HOSPITAL_COMMUNITY): Payer: 59

## 2017-10-24 ENCOUNTER — Encounter (HOSPITAL_COMMUNITY): Payer: Self-pay | Admitting: Emergency Medicine

## 2017-10-24 ENCOUNTER — Other Ambulatory Visit: Payer: Self-pay

## 2017-10-24 DIAGNOSIS — R112 Nausea with vomiting, unspecified: Secondary | ICD-10-CM | POA: Diagnosis not present

## 2017-10-24 DIAGNOSIS — Z87891 Personal history of nicotine dependence: Secondary | ICD-10-CM | POA: Insufficient documentation

## 2017-10-24 DIAGNOSIS — C801 Malignant (primary) neoplasm, unspecified: Secondary | ICD-10-CM | POA: Diagnosis not present

## 2017-10-24 DIAGNOSIS — Z8673 Personal history of transient ischemic attack (TIA), and cerebral infarction without residual deficits: Secondary | ICD-10-CM | POA: Insufficient documentation

## 2017-10-24 DIAGNOSIS — Z79899 Other long term (current) drug therapy: Secondary | ICD-10-CM | POA: Diagnosis not present

## 2017-10-24 DIAGNOSIS — C539 Malignant neoplasm of cervix uteri, unspecified: Secondary | ICD-10-CM | POA: Insufficient documentation

## 2017-10-24 DIAGNOSIS — R4182 Altered mental status, unspecified: Secondary | ICD-10-CM | POA: Diagnosis not present

## 2017-10-24 DIAGNOSIS — C787 Secondary malignant neoplasm of liver and intrahepatic bile duct: Secondary | ICD-10-CM | POA: Diagnosis not present

## 2017-10-24 DIAGNOSIS — R29818 Other symptoms and signs involving the nervous system: Secondary | ICD-10-CM | POA: Diagnosis not present

## 2017-10-24 DIAGNOSIS — Z7901 Long term (current) use of anticoagulants: Secondary | ICD-10-CM | POA: Diagnosis not present

## 2017-10-24 DIAGNOSIS — I639 Cerebral infarction, unspecified: Secondary | ICD-10-CM | POA: Diagnosis not present

## 2017-10-24 DIAGNOSIS — I10 Essential (primary) hypertension: Secondary | ICD-10-CM | POA: Insufficient documentation

## 2017-10-24 LAB — URINALYSIS, MICROSCOPIC (REFLEX): RBC / HPF: NONE SEEN RBC/hpf (ref 0–5)

## 2017-10-24 LAB — COMPREHENSIVE METABOLIC PANEL
ALT: 29 U/L (ref 0–44)
ANION GAP: 9 (ref 5–15)
AST: 38 U/L (ref 15–41)
Albumin: 2.5 g/dL — ABNORMAL LOW (ref 3.5–5.0)
Alkaline Phosphatase: 109 U/L (ref 38–126)
BILIRUBIN TOTAL: 0.7 mg/dL (ref 0.3–1.2)
BUN: 6 mg/dL (ref 6–20)
CALCIUM: 8.4 mg/dL — AB (ref 8.9–10.3)
CO2: 28 mmol/L (ref 22–32)
Chloride: 93 mmol/L — ABNORMAL LOW (ref 98–111)
Creatinine, Ser: 0.81 mg/dL (ref 0.44–1.00)
GFR calc Af Amer: >60 mL/min (ref >60)
Glucose, Bld: 169 mg/dL — ABNORMAL HIGH (ref 70–99)
POTASSIUM: 4.1 mmol/L (ref 3.5–5.1)
Sodium: 130 mmol/L — ABNORMAL LOW (ref 135–145)
TOTAL PROTEIN: 6.8 g/dL (ref 6.5–8.1)

## 2017-10-24 LAB — DIFFERENTIAL
Abs Immature Granulocytes: 0.1 10*3/uL (ref 0.0–0.1)
Basophils Absolute: 0.1 10*3/uL (ref 0.0–0.1)
Basophils Relative: 1 %
EOS PCT: 0 %
Eosinophils Absolute: 0 10*3/uL (ref 0.0–0.7)
Immature Granulocytes: 1 %
LYMPHS ABS: 2.2 10*3/uL (ref 0.7–4.0)
Lymphocytes Relative: 18 %
MONOS PCT: 10 %
Monocytes Absolute: 1.2 10*3/uL — ABNORMAL HIGH (ref 0.1–1.0)
Neutro Abs: 8.6 10*3/uL — ABNORMAL HIGH (ref 1.7–7.7)
Neutrophils Relative %: 70 %

## 2017-10-24 LAB — I-STAT TROPONIN, ED: TROPONIN I, POC: 0 ng/mL (ref 0.00–0.08)

## 2017-10-24 LAB — URINALYSIS, ROUTINE W REFLEX MICROSCOPIC
BILIRUBIN URINE: NEGATIVE
Glucose, UA: NEGATIVE mg/dL
HGB URINE DIPSTICK: NEGATIVE
KETONES UR: 15 mg/dL — AB
NITRITE: NEGATIVE
Protein, ur: NEGATIVE mg/dL
Specific Gravity, Urine: 1.015 (ref 1.005–1.030)
pH: 5.5 (ref 5.0–8.0)

## 2017-10-24 LAB — CBC
HEMATOCRIT: 35.1 % — AB (ref 36.0–46.0)
HEMOGLOBIN: 10.4 g/dL — AB (ref 12.0–15.0)
MCH: 26.6 pg (ref 26.0–34.0)
MCHC: 29.6 g/dL — AB (ref 30.0–36.0)
MCV: 89.8 fL (ref 78.0–100.0)
Platelets: UNDETERMINED 10*3/uL (ref 150–400)
RBC: 3.91 MIL/uL (ref 3.87–5.11)
RDW: 16.8 % — ABNORMAL HIGH (ref 11.5–15.5)
WBC: 12.2 10*3/uL — ABNORMAL HIGH (ref 4.0–10.5)

## 2017-10-24 LAB — PROTIME-INR
INR: 1.72
Prothrombin Time: 20 seconds — ABNORMAL HIGH (ref 11.4–15.2)

## 2017-10-24 LAB — APTT: aPTT: 45 seconds — ABNORMAL HIGH (ref 24–36)

## 2017-10-24 MED ORDER — HEPARIN SOD (PORK) LOCK FLUSH 100 UNIT/ML IV SOLN
500.0000 [IU] | Freq: Once | INTRAVENOUS | Status: AC
  Administered 2017-10-24: 500 [IU]
  Filled 2017-10-24: qty 5

## 2017-10-24 MED ORDER — GADOBENATE DIMEGLUMINE 529 MG/ML IV SOLN
20.0000 mL | Freq: Once | INTRAVENOUS | Status: AC | PRN
  Administered 2017-10-24: 20 mL via INTRAVENOUS

## 2017-10-24 MED ORDER — SODIUM CHLORIDE 0.9 % IV BOLUS
1000.0000 mL | Freq: Once | INTRAVENOUS | Status: AC
  Administered 2017-10-24: 1000 mL via INTRAVENOUS

## 2017-10-24 NOTE — ED Notes (Signed)
Port already accessed; flushed with saline. Water and crackers given per request

## 2017-10-24 NOTE — ED Notes (Signed)
Pt came in with port accessed. Wants to leave with it accessed, as she has cancer treatment tomorrow. Day 6/7 of access per patient, no s/s of infection.

## 2017-10-24 NOTE — ED Notes (Signed)
The pt has been ordered a new medicine she has taken two doses and her 2nd dose was at 1900  .  The family thought that her affect was different.  No pain anywhere speech clear no gait difficulty  She is being treated for cancer  picc line rt upper chest

## 2017-10-24 NOTE — ED Notes (Signed)
To mri 

## 2017-10-24 NOTE — ED Notes (Signed)
Pt refused venipuncture and request port access.

## 2017-10-24 NOTE — ED Provider Notes (Signed)
[facility] EMERGENCY DEPARTMENT Provider Note   CSN: [id number] Arrival date & time: 10/24/17  0001     History   Chief Complaint Chief Complaint  Patient presents with  . Altered Mental Status    HPI Cindy Faulkner is a 52 y.o. female.  Presents to the emergency department for evaluation of mental status changes.  Patient was with her husband earlier today when she started "acting loopy".  Husband reports that she started saying bizarre things like "I have to get these chicken feathers off of my face" and then started "singing with [facility]".  Family is concerned because she had very similar confusion with her previous stroke.  She never had any unilateral symptoms or typical stroke symptoms at that time.  She does, however, appear to have returned to her baseline currently.  She is currently receiving chemotherapy for metastatic cervical cancer.  She has been experiencing severe nausea and vomiting, inability to eat or drink because of the treatments.  She is on Zofran, Compazine and has recently started Marinol.     Past Medical History:  Diagnosis Date  . Atrial fibrillation ([place])    a. echo 4/07: EF 60%  . Atrial tachycardia ([place])    a. s/p EPS 4/09: no inducible SVT - med Tx continued  . Cancer ([place])   . Complication of anesthesia    age 30yo, woke during procedure with GA, paralysed   . Endometrial polyp   . Hyperlipidemia   . Hypertension   . PONV (postoperative nausea and vomiting)   . Rectocele    WITHOUT MENTION OF UTERINE PROLAPSE  . Stroke ([place])   . SVD (spontaneous vaginal delivery)    x 1    Patient Active Problem List   Diagnosis Date Noted  . Other fatigue 10/13/2017  . Deficiency anemia 10/13/2017  . Nausea with vomiting 09/22/2017  . Cervical cancer ([place]) 07/20/2017  . Anemia due to antineoplastic chemotherapy 07/19/2017  . Hypomagnesemia 06/28/2017  . Syncopal episodes 06/28/2017  . 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  Physical deconditioning 04/26/2017  . Night sweats 04/12/2017  . Port-A-Cath in place 04/04/2017  . Neuropathy associated with cancer ([place]) 04/04/2017  . Goals of care, counseling/discussion 04/04/2017  . Metastatic squamous cell carcinoma involving liver & sacrum with unknown primary site  03/30/2017  . Metastasis to liver of unknown origin ([place]) 03/18/2017  . Cancer of unknown origin ([place]) 03/11/2017  . Metastasis to liver ([place]) 03/03/2017  . Sacral mass 03/02/2017  . Cancer associated pain 03/02/2017  . Mild single current episode of major depressive disorder ([place]) 08/23/2016  . Cerebral thrombosis with cerebral infarction 12/05/2015  . Stroke (cerebrum) ([place]) 12/05/2015  . Obstructive sleep apnea 07/24/2015  . Preoperative evaluation to rule out surgical contraindication 10/04/2011  . Chest pain, unspecified 06/24/2010  . Shortness of breath 06/24/2010  . Essential hypertension 10/20/2008  . RECTOCELE WITHOUT MENTION OF UTERINE PROLAPSE 10/20/2008  . ENDOMETRIAL POLYP 10/20/2008  . AF (paroxysmal atrial fibrillation) ([place]) 10/20/2008    Past Surgical History:  Procedure Laterality Date  . COLONOSCOPY    . HYSTEROSCOPY    . IR FLUORO GUIDE PORT INSERTION RIGHT  03/23/2017  . IR US GUIDE VASC ACCESS RIGHT  03/23/2017  . LEEP    . Pylonidal cystect    . TUBAL LIGATION    . WISDOM TOOTH EXTRACTION       OB History   None      Home Medications    Prior  to Admission medications   Medication Sig Start Date End Date Taking? Authorizing Provider  ALPRAZolam Duanne Moron) 0.25 MG tablet Take 1 tablet (0.25 mg total) by mouth 3 (three) times daily as needed for sleep. 10/13/17   [name], MD  atorvastatin (LIPITOR) 40 MG tablet Take 1 tablet (40 mg total) by mouth daily at 6 PM. 12/06/15   Dhungel, Nishant, MD  cholecalciferol (VITAMIN D) 1000 units tablet Take 2,000 Units by mouth daily.    [provider]  citalopram (CELEXA) 20 MG tablet Take 20 mg by mouth daily.     [provider]  clonazePAM (KLONOPIN) 0.5 MG tablet Take 1 tablet (0.5 mg total) by mouth daily. 10/13/17   [name], MD  dronabinol (MARINOL) 5 MG capsule Take 1 capsule (5 mg total) by mouth 2 (two) times daily before lunch and supper. 10/21/17   [name], MD  flecainide (TAMBOCOR) 50 MG tablet Take 1 tablet (50 mg total) by mouth daily. 09/14/16   Evans Lance, MD  gabapentin (NEURONTIN) 300 MG capsule Take 1-2 capsules (300-600 mg total) by mouth 2 (two) times daily. 300 mg in the am and 600 mg at night 10/13/17   [name], MD  HYDROmorphone (DILAUDID) 4 MG tablet Take 1 tablet (4 mg total) by mouth every 6 (six) hours as needed for severe pain. 08/09/17   [name], MD  lidocaine-prilocaine (EMLA) cream Apply 1 application topically as needed. Patient taking differently: Apply 1 application topically as needed (port access).  04/01/17   [name], MD  lisinopril (PRINIVIL,ZESTRIL) 20 MG tablet Take 1 tablet (20 mg total) by mouth daily. 09/01/17   [name], MD  magnesium oxide (MAG-OX) 400 (241.3 Mg) MG tablet Take 1 tablet (400 mg total) by mouth 3 (three) times daily. 09/01/17   [name], MD  metFORMIN (GLUCOPHAGE) 500 MG tablet Take 500 mg by mouth 2 (two) times daily. 05/25/17   [provider]  metoprolol tartrate (LOPRESSOR) 50 MG tablet Take 1 tablet (50 mg total) by mouth 2 (two) times daily. 09/01/17   [name], MD  Omega-3 Fatty Acids (FISH OIL) 1000 MG CAPS Take 2 capsules by mouth daily.      [provider]  ondansetron (ZOFRAN) 8 MG tablet Take 1 tablet (8 mg total) by mouth every 8 (eight) hours as needed for nausea. 04/26/17   [name], MD  polyethylene glycol (MIRALAX / GLYCOLAX) packet Take 17 g by mouth daily. Patient not taking: Reported on 09/15/2017 04/01/17   Shelly Coss, MD  prochlorperazine (COMPAZINE) 10 MG tablet Take 10 mg by mouth every 6 (six) hours as needed for nausea or vomiting.    [provider]  rivaroxaban  (XARELTO) 20 MG TABS tablet Take 1 tablet (20 mg total) by mouth daily. 12/06/15   Dhungel, Flonnie Overman, MD  scopolamine (TRANSDERM-SCOP) 1 MG/3DAYS Place 1 patch (1.5 mg total) onto the skin every 3 (three) days. 09/22/17   [name], MD  valACYclovir (VALTREX) 1000 MG tablet Take 1 tablet (1,000 mg total) by mouth 3 (three) times daily. 09/22/17   [name], MD    Family History Family History  Problem Relation Age of Onset  . Hypertension Father   . Diabetes Father   . Stroke Father   . Hypertension Mother   . Stroke Mother   . Cancer Mother        cervical ca  . Cancer Sister 64       uterine ca  .  Cancer Maternal Grandmother        lung ca    Social History Social History   Tobacco Use  . Smoking status: Former Smoker    Packs/day: 0.50    Years: 10.00    Pack years: 5.00    Types: Cigarettes    Last attempt to quit: 02/22/1998    Years since quitting: 19.6  . Smokeless tobacco: Never Used  Substance Use Topics  . Alcohol use: No  . Drug use: No     Allergies   Patient has no known allergies.   Review of Systems Review of Systems  Gastrointestinal: Positive for nausea and vomiting.  Psychiatric/Behavioral: Positive for confusion.  All other systems reviewed and are negative.    Physical Exam Updated Vital Signs BP 114/69   Pulse 74   Temp 100.2 F (37.9 C)   Resp 16   LMP 02/12/2012   SpO2 95%   Physical Exam  Constitutional: She is oriented to person, place, and time. She appears well-developed and well-nourished. No distress.  HENT:  Head: Normocephalic and atraumatic.  Right Ear: Hearing normal.  Left Ear: Hearing normal.  Nose: Nose normal.  Mouth/Throat: Oropharynx is clear and moist and mucous membranes are normal.  Eyes: Pupils are equal, round, and reactive to light. Conjunctivae and EOM are normal.  Neck: Normal range of motion. Neck supple.  Cardiovascular: Regular rhythm, S1 normal and S2 normal. Exam reveals no gallop and no friction  rub.  No murmur heard. Pulmonary/Chest: Effort normal and breath sounds normal. No respiratory distress. She exhibits no tenderness.  Abdominal: Soft. Normal appearance and bowel sounds are normal. There is no hepatosplenomegaly. There is no tenderness. There is no rebound, no guarding, no tenderness at McBurney's point and negative Murphy's sign. No hernia.  Musculoskeletal: Normal range of motion.  Neurological: She is alert and oriented to person, place, and time. She has normal strength. No cranial nerve deficit or sensory deficit. Coordination normal. GCS eye subscore is 4. GCS verbal subscore is 5. GCS motor subscore is 6.  Skin: Skin is warm, dry and intact. No rash noted. No cyanosis.  Psychiatric: She has a normal mood and affect. Her speech is normal and behavior is normal. Thought content normal.  Nursing note and vitals reviewed.    ED Treatments / Results  Labs (all labs ordered are listed, but only abnormal results are displayed) Labs Reviewed  PROTIME-INR - Abnormal; Notable for the following components:      Result Value   Prothrombin Time 20.0 (*)    All other components within normal limits  APTT - Abnormal; Notable for the following components:   aPTT 45 (*)    All other components within normal limits  CBC - Abnormal; Notable for the following components:   WBC 12.2 (*)    Hemoglobin 10.4 (*)    HCT 35.1 (*)    MCHC 29.6 (*)    RDW 16.8 (*)    All other components within normal limits  DIFFERENTIAL - Abnormal; Notable for the following components:   Neutro Abs 8.6 (*)    Monocytes Absolute 1.2 (*)    All other components within normal limits  COMPREHENSIVE METABOLIC PANEL - Abnormal; Notable for the following components:   Sodium 130 (*)    Chloride 93 (*)    Glucose, Bld 169 (*)    Calcium 8.4 (*)    Albumin 2.5 (*)    All other components within normal limits  URINALYSIS, ROUTINE W REFLEX  MICROSCOPIC - Abnormal; Notable for the following components:    APPearance HAZY (*)    Ketones, ur 15 (*)    Leukocytes, UA TRACE (*)    All other components within normal limits  URINALYSIS, MICROSCOPIC (REFLEX) - Abnormal; Notable for the following components:   Bacteria, UA MANY (*)    All other components within normal limits  I-STAT TROPONIN, ED    EKG None  Radiology Ct Head Wo Contrast  Result Date: 10/24/2017 CLINICAL DATA:  52 year old female with history of stroke presenting with focal neurological deficit. EXAM: CT HEAD WITHOUT CONTRAST TECHNIQUE: Contiguous axial images were obtained from the base of the skull through the vertex without intravenous contrast. COMPARISON:  Brain MRI dated 03/23/2017 FINDINGS: Brain: The ventricles and sulci appropriate size for patient's age. Subcentimeter hypodense focus in the left internal capsule medial to the left lentiform nucleus similar to prior MRI most consistent with a small old infarct. There is no acute intracranial hemorrhage. No mass effect or midline shift. No extra-axial fluid collection. Vascular: No hyperdense vessel or unexpected calcification. Skull: Normal. Negative for fracture or focal lesion. Sinuses/Orbits: Partially visualized right maxillary sinus retention cyst or polyp. The remainder of the visualized paranasal sinuses and mastoid air cells are clear. Other: None IMPRESSION: 1. No acute intracranial pathology. 2. Small focus of old lacunar infarct in the left internal capsule. Electronically Signed   By: Anner Crete M.D.   On: 10/24/2017 00:45   Mr Jeri Cos And Wo Contrast  Result Date: 10/24/2017 CLINICAL DATA:  Feeling unwell, symptoms similar to prior stroke. Nausea and vomiting for 3 weeks. History of metastatic cervical cancer, on chemotherapy, hypertension, hyperlipidemia. EXAM: MRI HEAD WITHOUT AND WITH CONTRAST TECHNIQUE: Multiplanar, multiecho pulse sequences of the brain and surrounding structures were obtained without and with intravenous contrast. CONTRAST:  30mL  MULTIHANCE GADOBENATE DIMEGLUMINE 529 MG/ML IV SOLN COMPARISON:  CT HEAD October 24, 2017 and MRI of the head March 23, 2017. FINDINGS: INTRACRANIAL CONTENTS: No reduced diffusion to suggest acute ischemia or hypercellular tumor. No susceptibility artifact to suggest hemorrhage. Mild parenchymal brain volume loss. No hydrocephalus. Old bilateral basal ganglia lacunar infarcts. A few scattered subcentimeter supratentorial white matter FLAIR T2 hyperintensities compatible with mild chronic small vessel ischemic changes. No suspicious parenchymal signal, masses, mass effect. No abnormal intraparenchymal or extra-axial enhancement. No abnormal extra-axial fluid collections. No extra-axial masses. VASCULAR: Normal major intracranial vascular flow voids present at skull base. SKULL AND UPPER CERVICAL SPINE: No abnormal sellar expansion. Similar heterogeneous bone marrow signal, potentially treatment related. Craniocervical junction maintained. SINUSES/ORBITS: RIGHT maxillary mucosal retention cyst. Minimal fluid signal LEFT mastoid air cell.The included ocular globes and orbital contents are non-suspicious. OTHER: None. IMPRESSION: 1. No acute intracranial process or metastatic disease. 2. Old basal ganglia lacunar infarcts and mild chronic small vessel ischemic changes. 3. Mild parenchymal brain volume loss. Electronically Signed   By: Elon Alas M.D.   On: 10/24/2017 05:54    Procedures Procedures (including critical care time)  Medications Ordered in ED Medications  sodium chloride 0.9 % bolus 1,000 mL (0 mLs Intravenous Stopped 10/24/17 0432)  gadobenate dimeglumine (MULTIHANCE) injection 20 mL (20 mLs Intravenous Contrast Given 10/24/17 0540)     Initial Impression / Assessment and Plan / ED Course  I have reviewed the triage vital signs and the nursing notes.  Pertinent labs & imaging results that were available during my care of the patient were reviewed by me and considered in my medical  decision making (see  chart for details).     Patient was experiencing altered mental status earlier today.  She is currently being treated for metastatic cervical cancer.  She has had problems with nausea and vomiting, was just started on Marinol.  Husband reports that the Marinol seem to help her nausea and vomiting, but is not sure if the Marinol was causing altered sensation.  Family was concerned, however, because the patient had similar symptoms when she had a stroke in the past.  She underwent MRI with and without contrast.  This was to evaluate for the possibility of stroke as well as metastatic disease.  MRI was unremarkable.  Patient's lab work was entirely normal.  As she is back to her normal baseline and work-up has been unremarkable, she will be appropriate for discharge.  She might hold off on taking any further Marinol in case this is what caused her symptoms earlier.  Final Clinical Impressions(s) / ED Diagnoses   Final diagnoses:  Altered mental status, unspecified altered mental status type    ED Discharge Orders    None       Silvia Markuson, Gwenyth Allegra, MD 10/24/17 [phone]

## 2017-10-24 NOTE — ED Triage Notes (Addendum)
Pt's family states she "has been talking loopy". Pt states "this is very similar to first stroke symptoms."  Pt has a negative NIH score.   Pt is a cancer pt and is scheduled for Friday.  Negative NIH

## 2017-10-24 NOTE — Discharge Instructions (Addendum)
At this time it is not clear if the symptoms were caused by the Marinol tablets.  You might want to reduce the dose or hold off on taking any further until you contact your doctor.  The remainder of your work-up today has been reassuring.  Please contact your doctors this week for a recheck.

## 2017-10-25 ENCOUNTER — Telehealth: Payer: Self-pay | Admitting: *Deleted

## 2017-10-25 ENCOUNTER — Other Ambulatory Visit: Payer: Self-pay | Admitting: Hematology and Oncology

## 2017-10-25 DIAGNOSIS — C801 Malignant (primary) neoplasm, unspecified: Secondary | ICD-10-CM | POA: Diagnosis not present

## 2017-10-25 DIAGNOSIS — N390 Urinary tract infection, site not specified: Secondary | ICD-10-CM

## 2017-10-25 MED ORDER — AMOXICILLIN-POT CLAVULANATE 875-125 MG PO TABS: 1.0000 | ORAL_TABLET | Freq: Two times a day (BID) | ORAL | 0 refills | Status: DC

## 2017-10-25 MED FILL — AMOX-CLAV 875-125 MG TABLET: 875-125 | 7 days supply | Qty: 14 | Fill #0

## 2017-10-25 NOTE — Telephone Encounter (Signed)
Notified that we have cancelled the MRI on 9/6 as it one as already done on 9/2.  Dr Alvy Bimler is concerned that [facility] has UTI. Instructed to start Augmentin BID X 7 days- prescription sent to [facility].  Discussed appts for 9/9 and 9/30- lab, flush, MD and chemo. Pt is OK with that and message sent to scheduler.

## 2017-10-26 DIAGNOSIS — C801 Malignant (primary) neoplasm, unspecified: Secondary | ICD-10-CM | POA: Diagnosis not present

## 2017-10-27 DIAGNOSIS — C539 Malignant neoplasm of cervix uteri, unspecified: Secondary | ICD-10-CM | POA: Diagnosis not present

## 2017-10-27 DIAGNOSIS — C801 Malignant (primary) neoplasm, unspecified: Secondary | ICD-10-CM | POA: Diagnosis not present

## 2017-10-27 DIAGNOSIS — C787 Secondary malignant neoplasm of liver and intrahepatic bile duct: Secondary | ICD-10-CM | POA: Diagnosis not present

## 2017-10-27 DIAGNOSIS — G4733 Obstructive sleep apnea (adult) (pediatric): Secondary | ICD-10-CM | POA: Diagnosis not present

## 2017-10-28 ENCOUNTER — Ambulatory Visit (HOSPITAL_COMMUNITY): Payer: 59

## 2017-10-31 ENCOUNTER — Inpatient Hospital Stay (HOSPITAL_BASED_OUTPATIENT_CLINIC_OR_DEPARTMENT_OTHER): Payer: 59 | Admitting: Hematology and Oncology

## 2017-10-31 ENCOUNTER — Other Ambulatory Visit: Payer: Self-pay

## 2017-10-31 ENCOUNTER — Inpatient Hospital Stay: Payer: 59

## 2017-10-31 ENCOUNTER — Telehealth: Payer: Self-pay

## 2017-10-31 ENCOUNTER — Inpatient Hospital Stay: Payer: 59 | Attending: Hematology and Oncology

## 2017-10-31 VITALS — BP 111/86 | HR 96

## 2017-10-31 DIAGNOSIS — Z7984 Long term (current) use of oral hypoglycemic drugs: Secondary | ICD-10-CM | POA: Insufficient documentation

## 2017-10-31 DIAGNOSIS — I6330 Cerebral infarction due to thrombosis of unspecified cerebral artery: Secondary | ICD-10-CM

## 2017-10-31 DIAGNOSIS — Z5112 Encounter for antineoplastic immunotherapy: Secondary | ICD-10-CM | POA: Insufficient documentation

## 2017-10-31 DIAGNOSIS — Z7901 Long term (current) use of anticoagulants: Secondary | ICD-10-CM | POA: Diagnosis not present

## 2017-10-31 DIAGNOSIS — Z79899 Other long term (current) drug therapy: Secondary | ICD-10-CM

## 2017-10-31 DIAGNOSIS — C801 Malignant (primary) neoplasm, unspecified: Secondary | ICD-10-CM

## 2017-10-31 DIAGNOSIS — C539 Malignant neoplasm of cervix uteri, unspecified: Secondary | ICD-10-CM

## 2017-10-31 DIAGNOSIS — I10 Essential (primary) hypertension: Secondary | ICD-10-CM | POA: Diagnosis not present

## 2017-10-31 DIAGNOSIS — Z95828 Presence of other vascular implants and grafts: Secondary | ICD-10-CM

## 2017-10-31 DIAGNOSIS — C569 Malignant neoplasm of unspecified ovary: Secondary | ICD-10-CM | POA: Diagnosis not present

## 2017-10-31 DIAGNOSIS — F332 Major depressive disorder, recurrent severe without psychotic features: Secondary | ICD-10-CM | POA: Diagnosis not present

## 2017-10-31 DIAGNOSIS — N390 Urinary tract infection, site not specified: Secondary | ICD-10-CM

## 2017-10-31 DIAGNOSIS — C787 Secondary malignant neoplasm of liver and intrahepatic bile duct: Secondary | ICD-10-CM

## 2017-10-31 DIAGNOSIS — E876 Hypokalemia: Secondary | ICD-10-CM

## 2017-10-31 DIAGNOSIS — F320 Major depressive disorder, single episode, mild: Secondary | ICD-10-CM

## 2017-10-31 DIAGNOSIS — Z7189 Other specified counseling: Secondary | ICD-10-CM

## 2017-10-31 DIAGNOSIS — R112 Nausea with vomiting, unspecified: Secondary | ICD-10-CM | POA: Diagnosis not present

## 2017-10-31 DIAGNOSIS — G893 Neoplasm related pain (acute) (chronic): Secondary | ICD-10-CM | POA: Diagnosis not present

## 2017-10-31 DIAGNOSIS — E119 Type 2 diabetes mellitus without complications: Secondary | ICD-10-CM

## 2017-10-31 LAB — CBC WITH DIFFERENTIAL/PLATELET
Basophils Absolute: 0 10*3/uL (ref 0.0–0.1)
Basophils Relative: 0 %
EOS ABS: 0 10*3/uL (ref 0.0–0.5)
Eosinophils Relative: 0 %
HCT: 35.7 % (ref 34.8–46.6)
Hemoglobin: 11.4 g/dL — ABNORMAL LOW (ref 11.6–15.9)
Lymphocytes Relative: 14 %
Lymphs Abs: 1.9 10*3/uL (ref 0.9–3.3)
MCH: 27.5 pg (ref 25.1–34.0)
MCHC: 31.9 g/dL (ref 31.5–36.0)
MCV: 86 fL (ref 79.5–101.0)
MONO ABS: 1.4 10*3/uL — AB (ref 0.1–0.9)
Monocytes Relative: 10 %
Neutro Abs: 10.3 10*3/uL — ABNORMAL HIGH (ref 1.5–6.5)
Neutrophils Relative %: 76 %
Platelets: ADEQUATE 10*3/uL (ref 145–400)
RBC: 4.15 MIL/uL (ref 3.70–5.45)
RDW: 17.9 % — AB (ref 11.2–14.5)
WBC: 13.6 10*3/uL — ABNORMAL HIGH (ref 3.9–10.3)

## 2017-10-31 LAB — URINALYSIS, COMPLETE (UACMP) WITH MICROSCOPIC
BILIRUBIN URINE: NEGATIVE
Bacteria, UA: NONE SEEN
Glucose, UA: NEGATIVE mg/dL
Hgb urine dipstick: NEGATIVE
Ketones, ur: NEGATIVE mg/dL
Leukocytes, UA: NEGATIVE
Nitrite: NEGATIVE
PROTEIN: NEGATIVE mg/dL
Specific Gravity, Urine: 1.002 — ABNORMAL LOW (ref 1.005–1.030)
pH: 6 (ref 5.0–8.0)

## 2017-10-31 LAB — COMPREHENSIVE METABOLIC PANEL
ALBUMIN: 2.9 g/dL — AB (ref 3.5–5.0)
ALT: 29 U/L (ref 0–44)
AST: 37 U/L (ref 15–41)
Alkaline Phosphatase: 141 U/L — ABNORMAL HIGH (ref 38–126)
Anion gap: 14 (ref 5–15)
BUN: 8 mg/dL (ref 6–20)
CO2: 23 mmol/L (ref 22–32)
Calcium: 9.7 mg/dL (ref 8.9–10.3)
Chloride: 97 mmol/L — ABNORMAL LOW (ref 98–111)
Creatinine, Ser: 0.79 mg/dL (ref 0.44–1.00)
GFR calc Af Amer: >60 mL/min (ref >60)
GFR calc non Af Amer: >60 mL/min (ref >60)
GLUCOSE: 176 mg/dL — AB (ref 70–99)
POTASSIUM: 3.9 mmol/L (ref 3.5–5.1)
SODIUM: 134 mmol/L — AB (ref 135–145)
TOTAL PROTEIN: 8.2 g/dL — AB (ref 6.5–8.1)
Total Bilirubin: 1 mg/dL (ref 0.3–1.2)

## 2017-10-31 LAB — TSH: TSH: 0.643 u[IU]/mL (ref 0.308–3.960)

## 2017-10-31 LAB — MAGNESIUM: Magnesium: 1.4 mg/dL — CL (ref 1.7–2.4)

## 2017-10-31 MED ORDER — SODIUM CHLORIDE 0.9% FLUSH
10.0000 mL | INTRAVENOUS | Status: DC | PRN
  Administered 2017-10-31: 10 mL
  Filled 2017-10-31: qty 10

## 2017-10-31 MED ORDER — METFORMIN HCL 500 MG PO TABS: 500.0000 mg | ORAL_TABLET | Freq: Two times a day (BID) | ORAL | 11 refills | Status: DC

## 2017-10-31 MED ORDER — SODIUM CHLORIDE 0.9 % IV SOLN
Freq: Once | INTRAVENOUS | Status: AC
  Filled 2017-10-31: qty 250

## 2017-10-31 MED ORDER — SODIUM CHLORIDE 0.9 % IV SOLN
200.0000 mg | Freq: Once | INTRAVENOUS | Status: AC
  Administered 2017-10-31: 200 mg via INTRAVENOUS
  Filled 2017-10-31: qty 8

## 2017-10-31 MED ORDER — SODIUM CHLORIDE 0.9 % IV SOLN
Freq: Once | INTRAVENOUS | Status: AC
  Administered 2017-10-31: 15:00:00 via INTRAVENOUS
  Filled 2017-10-31: qty 250

## 2017-10-31 MED ORDER — FLUCONAZOLE 100 MG PO TABS: 100.0000 mg | ORAL_TABLET | Freq: Every day | ORAL | 0 refills | Status: DC

## 2017-10-31 MED ORDER — HEPARIN SOD (PORK) LOCK FLUSH 100 UNIT/ML IV SOLN
500.0000 [IU] | Freq: Once | INTRAVENOUS | Status: AC | PRN
  Administered 2017-10-31: 500 [IU]
  Filled 2017-10-31: qty 5

## 2017-10-31 MED ORDER — SODIUM CHLORIDE 0.9% FLUSH
10.0000 mL | Freq: Once | INTRAVENOUS | Status: AC
  Administered 2017-10-31: 10 mL
  Filled 2017-10-31: qty 10

## 2017-10-31 MED ORDER — PROMETHAZINE HCL 25 MG/ML IJ SOLN
INTRAMUSCULAR | Status: AC
  Filled 2017-10-31: qty 1

## 2017-10-31 MED ORDER — PROMETHAZINE HCL 25 MG/ML IJ SOLN
25.0000 mg | INTRAMUSCULAR | Status: DC | PRN
  Administered 2017-10-31: 25 mg via INTRAVENOUS

## 2017-10-31 MED FILL — metFORMIN HCL 500 MG TABS: 500 | 30 days supply | Qty: 60 | Fill #0

## 2017-10-31 MED FILL — FLUCONAZOLE 100 MG TAB: 100 | 7 days supply | Qty: 7 | Fill #0

## 2017-10-31 NOTE — Progress Notes (Signed)
Per Dr. Alvy Bimler: OK to proceed with Keytruda infusion with Magnesium level of 1.4

## 2017-10-31 NOTE — Telephone Encounter (Signed)
Per Dr Alvy Bimler may run IVF over 1 hour today

## 2017-10-31 NOTE — Telephone Encounter (Signed)
Daughter calling about mom's appointments.  "I do not see an appointment on the ED AVS.  I know she is to change treatment to [facility].  Is she confused or does she have an appointment scheduled today?"     Provided today's appointment schedule with 12:45 pm arrival time for registration.  Denies further questions or needs at this time.

## 2017-10-31 NOTE — Patient Instructions (Signed)
[facility] Discharge Instructions for Patients Receiving Chemotherapy  Today you received the following chemotherapy agents: Pembrolizumab Beryle Flock)  To help prevent nausea and vomiting after your treatment, we encourage you to take your nausea medication as directed.    If you develop nausea and vomiting that is not controlled by your nausea medication, call the clinic.   BELOW ARE SYMPTOMS THAT SHOULD BE REPORTED IMMEDIATELY:  *FEVER GREATER THAN 100.5 F  *CHILLS WITH OR WITHOUT FEVER  NAUSEA AND VOMITING THAT IS NOT CONTROLLED WITH YOUR NAUSEA MEDICATION  *UNUSUAL SHORTNESS OF BREATH  *UNUSUAL BRUISING OR BLEEDING  TENDERNESS IN MOUTH AND THROAT WITH OR WITHOUT PRESENCE OF ULCERS  *URINARY PROBLEMS  *BOWEL PROBLEMS  UNUSUAL RASH Items with * indicate a potential emergency and should be followed up as soon as possible.  Feel free to call the clinic should you have any questions or concerns. The clinic phone number is [phone].  Please show the [place] at check-in to the Emergency Department and triage nurse.  Pembrolizumab injection What is this medicine? PEMBROLIZUMAB (pem broe liz ue mab) is a monoclonal antibody. It is used to treat melanoma, head and neck cancer, Hodgkin lymphoma, non-small cell lung cancer, urothelial cancer, stomach cancer, and cancers that have a certain genetic condition. This medicine may be used for other purposes; ask your health care provider or pharmacist if you have questions. COMMON BRAND NAME(S): Keytruda What should I tell my health care provider before I take this medicine? They need to know if you have any of these conditions: -diabetes -immune system problems -inflammatory bowel disease -liver disease -lung or breathing disease -lupus -organ transplant -an unusual or allergic reaction to pembrolizumab, other medicines, foods, dyes, or preservatives -pregnant or trying to get  pregnant -breast-feeding How should I use this medicine? This medicine is for infusion into a vein. It is given by a health care professional in a hospital or clinic setting. A special MedGuide will be given to you before each treatment. Be sure to read this information carefully each time. Talk to your pediatrician regarding the use of this medicine in children. While this drug may be prescribed for selected conditions, precautions do apply. Overdosage: If you think you have taken too much of this medicine contact a poison control center or emergency room at once. NOTE: This medicine is only for you. Do not share this medicine with others. What if I miss a dose? It is important not to miss your dose. Call your doctor or health care professional if you are unable to keep an appointment. What may interact with this medicine? Interactions have not been studied. Give your health care provider a list of all the medicines, herbs, non-prescription drugs, or dietary supplements you use. Also tell them if you smoke, drink alcohol, or use illegal drugs. Some items may interact with your medicine. This list may not describe all possible interactions. Give your health care provider a list of all the medicines, herbs, non-prescription drugs, or dietary supplements you use. Also tell them if you smoke, drink alcohol, or use illegal drugs. Some items may interact with your medicine. What should I watch for while using this medicine? Your condition will be monitored carefully while you are receiving this medicine. You may need blood work done while you are taking this medicine. Do not become pregnant while taking this medicine or for 4 months after stopping it. Women should inform their doctor if they wish to become pregnant or think  they might be pregnant. There is a potential for serious side effects to an unborn child. Talk to your health care professional or pharmacist for more information. Do not breast-feed  an infant while taking this medicine or for 4 months after the last dose. What side effects may I notice from receiving this medicine? Side effects that you should report to your doctor or health care professional as soon as possible: -allergic reactions like skin rash, itching or hives, swelling of the face, lips, or tongue -bloody or black, tarry -breathing problems -changes in vision -chest pain -chills -constipation -cough -dizziness or feeling faint or lightheaded -fast or irregular heartbeat -fever -flushing -hair loss -low blood counts - this medicine may decrease the number of white blood cells, red blood cells and platelets. You may be at increased risk for infections and bleeding. -muscle pain -muscle weakness -persistent headache -signs and symptoms of high blood sugar such as dizziness; dry mouth; dry skin; fruity breath; nausea; stomach pain; increased hunger or thirst; increased urination -signs and symptoms of kidney injury like trouble passing urine or change in the amount of urine -signs and symptoms of liver injury like dark urine, light-colored stools, loss of appetite, nausea, right upper belly pain, yellowing of the eyes or skin -stomach pain -sweating -weight loss Side effects that usually do not require medical attention (report to your doctor or health care professional if they continue or are bothersome): -decreased appetite -diarrhea -tiredness This list may not describe all possible side effects. Call your doctor for medical advice about side effects. You may report side effects to FDA at 1-800-FDA-1088. Where should I keep my medicine? This drug is given in a hospital or clinic and will not be stored at home. NOTE: This sheet is a summary. It may not cover all possible information. If you have questions about this medicine, talk to your doctor, pharmacist, or health care provider.  2018 Elsevier/Gold Standard (2015-11-18 12:29:36)   Dehydration,  Adult Dehydration is when there is not enough fluid or water in your body. This happens when you lose more fluids than you take in. Dehydration can range from mild to very bad. It should be treated right away to keep it from getting very bad. Symptoms of mild dehydration may include:  Thirst.  Dry lips.  Slightly dry mouth.  Dry, warm skin.  Dizziness. Symptoms of moderate dehydration may include:  Very dry mouth.  Muscle cramps.  Dark pee (urine). Pee may be the color of tea.  Your body making less pee.  Your eyes making fewer tears.  Heartbeat that is uneven or faster than normal (palpitations).  Headache.  Light-headedness, especially when you stand up from sitting.  Fainting (syncope). Symptoms of very bad dehydration may include:  Changes in skin, such as: ? Cold and clammy skin. ? Blotchy (mottled) or pale skin. ? Skin that does not quickly return to normal after being lightly pinched and let go (poor skin turgor).  Changes in body fluids, such as: ? Feeling very thirsty. ? Your eyes making fewer tears. ? Not sweating when body temperature is high, such as in hot weather. ? Your body making very little pee.  Changes in vital signs, such as: ? Weak pulse. ? Pulse that is more than 100 beats a minute when you are sitting still. ? Fast breathing. ? Low blood pressure.  Other changes, such as: ? Sunken eyes. ? Cold hands and feet. ? Confusion. ? Lack of energy (lethargy). ? Trouble waking up  from sleep. ? Short-term weight loss. ? Unconsciousness. Follow these instructions at home:  If told by your doctor, drink an ORS: ? Make an ORS by using instructions on the package. ? Start by drinking small amounts, about  cup (120 mL) every 5-10 minutes. ? Slowly drink more until you have had the amount that your doctor said to have.  Drink enough clear fluid to keep your pee clear or pale yellow. If you were told to drink an ORS, finish the ORS first, then  start slowly drinking clear fluids. Drink fluids such as: ? Water. Do not drink only water by itself. Doing that can make the salt (sodium) level in your body get too low (hyponatremia). ? Ice chips. ? Fruit juice that you have added water to (diluted). ? Low-calorie sports drinks.  Avoid: ? Alcohol. ? Drinks that have a lot of sugar. These include high-calorie sports drinks, fruit juice that does not have water added, and soda. ? Caffeine. ? Foods that are greasy or have a lot of fat or sugar.  Take over-the-counter and prescription medicines only as told by your doctor.  Do not take salt tablets. Doing that can make the salt level in your body get too high (hypernatremia).  Eat foods that have minerals (electrolytes). Examples include bananas, oranges, potatoes, tomatoes, and spinach.  Keep all follow-up visits as told by your doctor. This is important. Contact a doctor if:  You have belly (abdominal) pain that: ? Gets worse. ? Stays in one area (localizes).  You have a rash.  You have a stiff neck.  You get angry or annoyed more easily than normal (irritability).  You are more sleepy than normal.  You have a harder time waking up than normal.  You feel: ? Weak. ? Dizzy. ? Very thirsty.  You have peed (urinated) only a small amount of very dark pee during 6-8 hours. Get help right away if:  You have symptoms of very bad dehydration.  You cannot drink fluids without throwing up (vomiting).  Your symptoms get worse with treatment.  You have a fever.  You have a very bad headache.  You are throwing up or having watery poop (diarrhea) and it: ? Gets worse. ? Does not go away.  You have blood or something green (bile) in your throw-up.  You have blood in your poop (stool). This may cause poop to look black and tarry.  You have not peed in 6-8 hours.  You pass out (faint).  Your heart rate when you are sitting still is more than 100 beats a minute.  You  have trouble breathing. This information is not intended to replace advice given to you by your health care provider. Make sure you discuss any questions you have with your health care provider. Document Released: 12/05/2008 Document Revised: 08/29/2015 Document Reviewed: 04/04/2015 Elsevier Interactive Patient Education  2018 [employer].

## 2017-11-01 ENCOUNTER — Encounter: Payer: Self-pay | Admitting: Hematology and Oncology

## 2017-11-01 DIAGNOSIS — C801 Malignant (primary) neoplasm, unspecified: Secondary | ICD-10-CM | POA: Diagnosis not present

## 2017-11-01 NOTE — Assessment & Plan Note (Signed)
She has significant depression and seems to be withdrawn from family members I had a lot of discussion with the patient and family and continue to encourage her to participate in family activities. She appears motivated to try to engage with family members. She denies suicidal ideation

## 2017-11-01 NOTE — Assessment & Plan Note (Signed)
She continues to have uncontrolled nausea and vomiting despite minimal oral intake and not being on chemotherapy I recommend she continues on scopolamine patch I recommend scheduled Zofran and benzodiazepine as needed I recommend Marinol MRI of the brain has excluded intracranial metastasis I recommend IV fluids and IV antiemetics at home for supportive care

## 2017-11-01 NOTE — Assessment & Plan Note (Signed)
She continues to have persistent hypomagnesemia despite oral magnesium replacement therapy I recommend we continue the same

## 2017-11-01 NOTE — Assessment & Plan Note (Signed)
Even though she was originally diagnosed with cancer of unknown origin, subsequent additional study revealed that the origin is likely cervical in nature. Her tumor tested positive for PDL 1 and she would qualify for immunotherapy. We discussed the role of treatment.  Role of treatment is palliative and it is a recommendation according to NCCN guidelines The treatment is based on current publication as listed below, for patient who expresses PDL 1+ disease  Efficacy and Safety of Pembrolizumab in Previously Treated Advanced Cervical Cancer: Results From the Phase II Hughes, MD, PhD1; Kandice Hams, MSc2; Dolly Rias, MD, PhD3; Judyann Munson, MD, PhD4; Roanna Epley, MD, PhD5; Edd Arbour Shapira-Frommer, [place]; Rayford Halsted, MD7; Karsten Fells, MD8; Mariana Kaufman, PhD9; Burt Ek, RN9; Odelia Gage. Blanca Friend, MD, PhD9; and Deniece Portela, MD, PhD  J Clin Oncol [phone].  2019 by [employer] Society of [place] ([place].gov identifier: [id number]) is a phase II basket study investigating the antitumor activity and safety of pembrolizumab in multiple cancer types. We present interim results from patients with previously treated advanced cervical cancer.  PATIENTS AND METHODS Patients received pembrolizumab 200 mg every 3 weeks for 2 years or until progression, intolerable toxicity, or physician or patient decision. Tumor imaging was performed every 9 weeks for the first 12 months and every 12 weeks thereafter. The primary end point was objective response rate (ORR), assessed per Response Evaluation Criteria in Solid Tumors (version 1.1) by independent central radiologic review. Safety was a secondary end point.  RESULTS Ninety-eight patients were treated. Median age was 46.0 years (range, 24 to 75 years), and 65.3% of patients had [place] Cooperative Oncology Group performance status of 1. Eighty-two patients (83.7%) had  programmed death-ligand 1 (PD-L1)-positive tumors (combined positive score $ 1), 77 having previously received one or more lines of chemotherapy for recurrent or metastatic disease. Median follow-up was 10.2 months (range, 0.6 to 22.7 months). ORR was 12.2% (95% CI, 6.5% to 20.4%), with three complete and nine partial responses. All 12 responses were in patients with PD-L1-positive tumors, for an ORR of 14.6% (95% CI, 7.8% to 24.2%); 14.3% (95% CI, 7.4% to 24.1%) of these responses were in those who had received one or more lines of chemotherapy for recurrent or metastatic disease. Median duration of response was not reached (range,$3.7 to$18.6 months). Treatment-related adverse events occurred in 65.3% of patients, and the most common were hypothyroidism (10.2%), decreased appetite (9.2%), and fatigue (9.2%). Treatment-related grade 3 to 4 adverse events occurred in 12.2% of patients.  CONCLUSION Pembrolizumab monotherapy demonstrated durable antitumor activity and manageable safety in patients with advanced cervical cancer. On the basis of these results, the [place] Food and Drug Administration granted accelerated approval of pembrolizumab for patients with advanced PD-L1-positive cervical cancer who experienced progression during or after chemotherapy  The recommended dose and schedule of pembrolizumab for this indication is 200 mg administered as an intravenous infusion over 30 minutes every 3 weeks.   We discussed some of the risks, benefits, side-effects of Pembrolizumab  Some of the short term side-effects included, though not limited to, risk of fatigue, pancytopenia, life-threatening infections, allergic reactions, nausea, vomiting, hepatitis, changes in bowel habits especially diarrhea, admission to hospital for various reasons, and risks of death.   The patient is aware that the response rates discussed earlier is not guaranteed.    After a long discussion, patient made an informed decision to  proceed.   Patient education material was dispensed. We will proceed with first dose of  treatment and I plan to see her back in 3 weeks for further follow-up We discussed the importance of aggressive supportive care through advanced home care agency

## 2017-11-01 NOTE — Assessment & Plan Note (Addendum)
We had extensive goals of care discussion She understood treatment goal is palliative We had extensive discussion about the goals of care and ultimately, the patient is willing to ask for help and take her medications as instructed

## 2017-11-01 NOTE — Progress Notes (Signed)
[facility] OFFICE PROGRESS NOTE  Patient Care Team: Cleda Mccreedy as PCP - General (Nurse Practitioner) Arvella Nigh, MD as Obstetrician (Obstetrics and Gynecology) Evans Lance, MD as Consulting Physician (Cardiology)  ASSESSMENT & PLAN:  Cancer of unknown origin [facility]) Even though she was originally diagnosed with cancer of unknown origin, subsequent additional study revealed that the origin is likely cervical in nature. Her tumor tested positive for PDL 1 and she would qualify for immunotherapy. We discussed the role of treatment.  Role of treatment is palliative and it is a recommendation according to NCCN guidelines The treatment is based on current publication as listed below, for patient who expresses PDL 1+ disease  Efficacy and Safety of Pembrolizumab in Previously Treated Advanced Cervical Cancer: Results From the Phase II [place], MD, PhD1; Kandice Hams, MSc2; Dolly Rias, MD, PhD3; Judyann Munson, MD, PhD4; Roanna Epley, MD, PhD5; Edd Arbour Shapira-Frommer, [place]; Rayford Halsted, MD7; Karsten Fells, MD8; Mariana Kaufman, PhD9; Burt Ek, RN9; Odelia Gage. Blanca Friend, MD, PhD9; and Deniece Portela, MD, PhD  J Clin Oncol [phone].  2019 by American Society of [place] ([place].gov identifier: [id number]) is a phase II basket study investigating the antitumor activity and safety of pembrolizumab in multiple cancer types. We present interim results from patients with previously treated advanced cervical cancer.  PATIENTS AND METHODS Patients received pembrolizumab 200 mg every 3 weeks for 2 years or until progression, intolerable toxicity, or physician or patient decision. Tumor imaging was performed every 9 weeks for the first 12 months and every 12 weeks thereafter. The primary end point was objective response rate (ORR), assessed per Response Evaluation Criteria in Solid Tumors (version  1.1) by independent central radiologic review. Safety was a secondary end point.  RESULTS Ninety-eight patients were treated. Median age was 46.0 years (range, 24 to 75 years), and 65.3% of patients had [place] Cooperative Oncology Group performance status of 1. Eighty-two patients (83.7%) had programmed death-ligand 1 (PD-L1)-positive tumors (combined positive score $ 1), 77 having previously received one or more lines of chemotherapy for recurrent or metastatic disease. Median follow-up was 10.2 months (range, 0.6 to 22.7 months). ORR was 12.2% (95% CI, 6.5% to 20.4%), with three complete and nine partial responses. All 12 responses were in patients with PD-L1-positive tumors, for an ORR of 14.6% (95% CI, 7.8% to 24.2%); 14.3% (95% CI, 7.4% to 24.1%) of these responses were in those who had received one or more lines of chemotherapy for recurrent or metastatic disease. Median duration of response was not reached (range,$3.7 to$18.6 months). Treatment-related adverse events occurred in 65.3% of patients, and the most common were hypothyroidism (10.2%), decreased appetite (9.2%), and fatigue (9.2%). Treatment-related grade 3 to 4 adverse events occurred in 12.2% of patients.  CONCLUSION Pembrolizumab monotherapy demonstrated durable antitumor activity and manageable safety in patients with advanced cervical cancer. On the basis of these results, the [place] Food and Drug Administration granted accelerated approval of pembrolizumab for patients with advanced PD-L1-positive cervical cancer who experienced progression during or after chemotherapy  The recommended dose and schedule of pembrolizumab for this indication is 200 mg administered as an intravenous infusion over 30 minutes every 3 weeks.   We discussed some of the risks, benefits, side-effects of Pembrolizumab  Some of the short term side-effects included, though not limited to, risk of fatigue, pancytopenia, life-threatening infections, allergic  reactions, nausea, vomiting, hepatitis, changes in bowel habits especially diarrhea, admission to hospital for various reasons, and  risks of death.   The patient is aware that the response rates discussed earlier is not guaranteed.    After a long discussion, patient made an informed decision to proceed.   Patient education material was dispensed. We will proceed with first dose of treatment and I plan to see her back in 3 weeks for further follow-up We discussed the importance of aggressive supportive care through advanced home care agency  Hypomagnesemia She continues to have persistent hypomagnesemia despite oral magnesium replacement therapy I recommend we continue the same  Nausea with vomiting She continues to have uncontrolled nausea and vomiting despite minimal oral intake and not being on chemotherapy I recommend she continues on scopolamine patch I recommend scheduled Zofran and benzodiazepine as needed I recommend Marinol MRI of the brain has excluded intracranial metastasis I recommend IV fluids and IV antiemetics at home for supportive care  Mild single current episode of major depressive disorder ([place]) She has significant depression and seems to be withdrawn from family members I had a lot of discussion with the patient and family and continue to encourage her to participate in family activities. She appears motivated to try to engage with family members. She denies suicidal ideation  Goals of care, counseling/discussion We had extensive goals of care discussion She understood treatment goal is palliative We had extensive discussion about the goals of care and ultimately, the patient is willing to ask for help and take her medications as instructed   No orders of the defined types were placed in this encounter.   INTERVAL HISTORY: Please see below for problem oriented charting. She returns with her daughter for further follow-up She continues to have chronic  nausea.  She denies pain.  Family members noted that she is still quite withdrawn with depression but she denies suicidal ideation. She stated she is compliant taking all her medications as directed and continues to have nausea IV fluids at home along with IV antiemetics were helpful.  SUMMARY OF ONCOLOGIC HISTORY: Oncology History   Biothernostics: 96% probability cervical cancer  Additional evaluation revealed PTEN deletion, negative for MET amplification, PD-L1 expression of 60     Metastasis to liver ([place])   03/03/2017 Initial Diagnosis    Metastasis to liver ([place])    08/08/2017 - 11/02/2017 Chemotherapy    The patient had bevacizumab (AVASTIN) 1,800 mg in sodium chloride 0.9 % 100 mL chemo infusion, 15 mg/kg = 1,800 mg, Intravenous,  Once, 4 of 5 cycles Administration: 1,800 mg (08/09/2017), 1,800 mg (09/01/2017), 1,800 mg (09/22/2017), 1,800 mg (10/13/2017)  for chemotherapy treatment.     10/25/2017 -  Chemotherapy    The patient had pembrolizumab (KEYTRUDA) 200 mg in sodium chloride 0.9 % 50 mL chemo infusion, 200 mg, Intravenous, Once, 1 of 6 cycles Administration: 200 mg (10/31/2017)  for chemotherapy treatment.      Cancer of unknown origin ([place])   03/03/2017 Imaging    1. Multifocal liver metastasis. 2. Left posterior pelvic mass extending into the obturator foramen measures up to 7 cm. Suspicious for malignancy. This should be easily amendable to percutaneous tissue sampling under image guidance. 3. No lytic bone lesions identified of myeloma.    03/09/2017 Procedure    Technically successful ultrasound-guided core liver lesion biopsy    03/09/2017 Pathology Results    A. LIVER LESION, RIGHT LOBE; ULTRASOUND-GUIDED BIOPSY:  - METASTATIC SQUAMOUS CELL CARCINOMA, SEE COMMENT.   Comment: Metastatic carcinoma is immunoreactive for p40, p16, p53 and pancytokeratin while CK7, CK20, and TTF1 are  negative. Significance of p16 positivity depends on the site of origin and the pattern of  immunoreactivity in this material is non-specific. Possible sites of origin include gynecologic, anal, lung, and head/neck. Correlation with physical exam and additional imaging is required.     03/18/2017 Pathology Results    She had GYN exam and pap smear. Result is negative for malignancy    03/21/2017 - 04/01/2017 Hospital Admission    She was admitted to the hospital for pain management and had radiation therapy    03/23/2017 Procedure    Ultrasound and fluoroscopically guided right internal jugular single lumen power port catheter insertion. Tip in the SVC/RA junction. Catheter ready for use.    05/16/2017 Imaging    Mild decrease in size of left posterior pelvic soft tissue mass and liver metastases. No new or progressive metastatic disease identified.  Stable hepatic steatosis and tiny nonobstructing right renal calculus.    08/08/2017 Imaging    Decreased liver metastases.  Decreased size of left internal iliac mass or lymphadenopathy, which abuts the left sacrum.  No new or progressive metastatic disease.  Stable severe hepatic steatosis.    08/08/2017 - 11/02/2017 Chemotherapy    The patient had bevacizumab (AVASTIN) 1,800 mg in sodium chloride 0.9 % 100 mL chemo infusion, 15 mg/kg = 1,800 mg, Intravenous,  Once, 4 of 5 cycles Administration: 1,800 mg (08/09/2017), 1,800 mg (09/01/2017), 1,800 mg (09/22/2017), 1,800 mg (10/13/2017)  for chemotherapy treatment.     10/21/2017 Imaging    1. Interval progression of liver metastases. 2. Poorly marginated left pelvic side wall soft tissue mass is mildly decreased in size. 3. Diffuse hepatic steatosis. 4. Aortic Atherosclerosis (ICD10-I70.0).    10/24/2017 Imaging    1. No acute intracranial process or metastatic disease. 2. Old basal ganglia lacunar infarcts and mild chronic small vessel ischemic changes. 3. Mild parenchymal brain volume loss.     10/25/2017 -  Chemotherapy    The patient had pembrolizumab (KEYTRUDA) 200 mg in  sodium chloride 0.9 % 50 mL chemo infusion, 200 mg, Intravenous, Once, 1 of 6 cycles Administration: 200 mg (10/31/2017)  for chemotherapy treatment.      Cervical cancer ([place])   07/20/2017 Initial Diagnosis    Cervical cancer ([place])    08/08/2017 - 11/02/2017 Chemotherapy    The patient had bevacizumab (AVASTIN) 1,800 mg in sodium chloride 0.9 % 100 mL chemo infusion, 15 mg/kg = 1,800 mg, Intravenous,  Once, 4 of 5 cycles Administration: 1,800 mg (08/09/2017), 1,800 mg (09/01/2017), 1,800 mg (09/22/2017), 1,800 mg (10/13/2017)  for chemotherapy treatment.     10/25/2017 -  Chemotherapy    The patient had pembrolizumab (KEYTRUDA) 200 mg in sodium chloride 0.9 % 50 mL chemo infusion, 200 mg, Intravenous, Once, 1 of 6 cycles Administration: 200 mg (10/31/2017)  for chemotherapy treatment.      REVIEW OF SYSTEMS:   Constitutional: Denies fevers, chills or abnormal weight loss Eyes: Denies blurriness of vision Ears, nose, mouth, throat, and face: Denies mucositis or sore throat Respiratory: Denies cough, dyspnea or wheezes Cardiovascular: Denies palpitation, chest discomfort or lower extremity swelling Skin: Denies abnormal skin rashes Lymphatics: Denies new lymphadenopathy or easy bruising Neurological:Denies numbness, tingling or new weaknesses All other systems were reviewed with the patient and are negative.  I have reviewed the past medical history, past surgical history, social history and family history with the patient and they are unchanged from previous note.  ALLERGIES:  has No Known Allergies.  MEDICATIONS:  Current Outpatient Medications  Medication Sig Dispense Refill  . ALPRAZolam (XANAX) 0.25 MG tablet Take 1 tablet (0.25 mg total) by mouth 3 (three) times daily as needed for sleep. 60 tablet 0  . atorvastatin (LIPITOR) 40 MG tablet Take 1 tablet (40 mg total) by mouth daily at 6 PM. 30 tablet 0  . cholecalciferol (VITAMIN D) 1000 units tablet Take 2,000 Units by mouth daily.     . citalopram (CELEXA) 20 MG tablet Take 20 mg by mouth daily.    . clonazePAM (KLONOPIN) 0.5 MG tablet Take 1 tablet (0.5 mg total) by mouth daily. 30 tablet 0  . dronabinol (MARINOL) 5 MG capsule Take 1 capsule (5 mg total) by mouth 2 (two) times daily before lunch and supper. 60 capsule 0  . flecainide (TAMBOCOR) 50 MG tablet Take 1 tablet (50 mg total) by mouth daily. 90 tablet 3  . fluconazole (DIFLUCAN) 100 MG tablet Take 1 tablet (100 mg total) by mouth daily. 7 tablet 0  . gabapentin (NEURONTIN) 300 MG capsule Take 1-2 capsules (300-600 mg total) by mouth 2 (two) times daily. 300 mg in the am and 600 mg at night 90 capsule 11  . HYDROmorphone (DILAUDID) 4 MG tablet Take 1 tablet (4 mg total) by mouth every 6 (six) hours as needed for severe pain. 60 tablet 0  . lidocaine-prilocaine (EMLA) cream Apply 1 application topically as needed. (Patient taking differently: Apply 1 application topically as needed (port access). ) 30 g 0  . lisinopril (PRINIVIL,ZESTRIL) 20 MG tablet Take 1 tablet (20 mg total) by mouth daily. 90 tablet 3  . magnesium oxide (MAG-OX) 400 (241.3 Mg) MG tablet Take 1 tablet (400 mg total) by mouth 3 (three) times daily. 60 tablet 3  . metFORMIN (GLUCOPHAGE) 500 MG tablet Take 1 tablet (500 mg total) by mouth 2 (two) times daily. 60 tablet 11  . metoprolol tartrate (LOPRESSOR) 50 MG tablet Take 1 tablet (50 mg total) by mouth 2 (two) times daily. 90 tablet 3  . Omega-3 Fatty Acids (FISH OIL) 1000 MG CAPS Take 2 capsules by mouth daily.      . ondansetron (ZOFRAN) 8 MG tablet Take 1 tablet (8 mg total) by mouth every 8 (eight) hours as needed for nausea. 90 tablet 1  . polyethylene glycol (MIRALAX / GLYCOLAX) packet Take 17 g by mouth daily. (Patient not taking: Reported on 09/15/2017) 14 each 0  . prochlorperazine (COMPAZINE) 10 MG tablet Take 10 mg by mouth every 6 (six) hours as needed for nausea or vomiting.    . rivaroxaban (XARELTO) 20 MG TABS tablet Take 1 tablet (20 mg  total) by mouth daily. 30 tablet 0  . scopolamine (TRANSDERM-SCOP) 1 MG/3DAYS Place 1 patch (1.5 mg total) onto the skin every 3 (three) days. 10 patch 12  . valACYclovir (VALTREX) 1000 MG tablet Take 1 tablet (1,000 mg total) by mouth 3 (three) times daily. 21 tablet 0   No current facility-administered medications for this visit.     PHYSICAL EXAMINATION: ECOG PERFORMANCE STATUS: 2 - Symptomatic, <50% confined to bed  Vitals:   10/31/17 1416  BP: 112/70  Pulse: (!) 124  Resp: 18  Temp: 98.6 F (37 C)  SpO2: 99%   Filed Weights   10/31/17 1416  Weight: 235 lb (106.6 kg)    GENERAL:alert, no distress and comfortable SKIN: skin color, texture, turgor are normal, no rashes or significant lesions EYES: normal, Conjunctiva are pink and non-injected, sclera clear OROPHARYNX:no exudate, no erythema and lips, buccal mucosa,  and tongue normal  NECK: supple, thyroid normal size, non-tender, without nodularity LYMPH:  no palpable lymphadenopathy in the cervical, axillary or inguinal LUNGS: clear to auscultation and percussion with normal breathing effort HEART: regular rate & rhythm and no murmurs and no lower extremity edema ABDOMEN:abdomen soft, non-tender and normal bowel sounds Musculoskeletal:no cyanosis of digits and no clubbing  NEURO: alert & oriented x 3 with fluent speech, no focal motor/sensory deficits  LABORATORY DATA:  I have reviewed the data as listed    Component Value Date/Time   NA 134 (L) 10/31/2017 1413   K 3.9 10/31/2017 1413   CL 97 (L) 10/31/2017 1413   CO2 23 10/31/2017 1413   GLUCOSE 176 (H) 10/31/2017 1413   BUN 8 10/31/2017 1413   CREATININE 0.79 10/31/2017 1413   CREATININE 0.81 06/28/2017 0851   CALCIUM 9.7 10/31/2017 1413   PROT 8.2 (H) 10/31/2017 1413   ALBUMIN 2.9 (L) 10/31/2017 1413   AST 37 10/31/2017 1413   AST 43 (H) 06/28/2017 0851   ALT 29 10/31/2017 1413   ALT 55 06/28/2017 0851   ALKPHOS 141 (H) 10/31/2017 1413   BILITOT 1.0  10/31/2017 1413   BILITOT 0.6 06/28/2017 0851   GFRNONAA >60 10/31/2017 1413   GFRNONAA >60 06/28/2017 0851   GFRAA >60 10/31/2017 1413   GFRAA >60 06/28/2017 0851    No results found for: SPEP, UPEP  Lab Results  Component Value Date   WBC 13.6 (H) 10/31/2017   NEUTROABS 10.3 (H) 10/31/2017   HGB 11.4 (L) 10/31/2017   HCT 35.7 10/31/2017   MCV 86.0 10/31/2017   PLT  10/31/2017    PLATELET CLUMPS NOTED ON SMEAR, COUNT APPEARS ADEQUATE      Chemistry      Component Value Date/Time   NA 134 (L) 10/31/2017 1413   K 3.9 10/31/2017 1413   CL 97 (L) 10/31/2017 1413   CO2 23 10/31/2017 1413   BUN 8 10/31/2017 1413   CREATININE 0.79 10/31/2017 1413   CREATININE 0.81 06/28/2017 0851      Component Value Date/Time   CALCIUM 9.7 10/31/2017 1413   ALKPHOS 141 (H) 10/31/2017 1413   AST 37 10/31/2017 1413   AST 43 (H) 06/28/2017 0851   ALT 29 10/31/2017 1413   ALT 55 06/28/2017 0851   BILITOT 1.0 10/31/2017 1413   BILITOT 0.6 06/28/2017 0851       RADIOGRAPHIC STUDIES: I have personally reviewed the radiological images as listed and agreed with the findings in the report. Ct Head Wo Contrast  Result Date: 10/24/2017 CLINICAL DATA:  52 year old female with history of stroke presenting with focal neurological deficit. EXAM: CT HEAD WITHOUT CONTRAST TECHNIQUE: Contiguous axial images were obtained from the base of the skull through the vertex without intravenous contrast. COMPARISON:  Brain MRI dated 03/23/2017 FINDINGS: Brain: The ventricles and sulci appropriate size for patient's age. Subcentimeter hypodense focus in the left internal capsule medial to the left lentiform nucleus similar to prior MRI most consistent with a small old infarct. There is no acute intracranial hemorrhage. No mass effect or midline shift. No extra-axial fluid collection. Vascular: No hyperdense vessel or unexpected calcification. Skull: Normal. Negative for fracture or focal lesion. Sinuses/Orbits:  Partially visualized right maxillary sinus retention cyst or polyp. The remainder of the visualized paranasal sinuses and mastoid air cells are clear. Other: None IMPRESSION: 1. No acute intracranial pathology. 2. Small focus of old lacunar infarct in the left internal capsule. Electronically Signed   By: Milas Hock  Radparvar M.D.   On: 10/24/2017 00:45   Mr Jeri Cos And Wo Contrast  Result Date: 10/24/2017 CLINICAL DATA:  Feeling unwell, symptoms similar to prior stroke. Nausea and vomiting for 3 weeks. History of metastatic cervical cancer, on chemotherapy, hypertension, hyperlipidemia. EXAM: MRI HEAD WITHOUT AND WITH CONTRAST TECHNIQUE: Multiplanar, multiecho pulse sequences of the brain and surrounding structures were obtained without and with intravenous contrast. CONTRAST:  35m MULTIHANCE GADOBENATE DIMEGLUMINE 529 MG/ML IV SOLN COMPARISON:  CT HEAD October 24, 2017 and MRI of the head March 23, 2017. FINDINGS: INTRACRANIAL CONTENTS: No reduced diffusion to suggest acute ischemia or hypercellular tumor. No susceptibility artifact to suggest hemorrhage. Mild parenchymal brain volume loss. No hydrocephalus. Old bilateral basal ganglia lacunar infarcts. A few scattered subcentimeter supratentorial white matter FLAIR T2 hyperintensities compatible with mild chronic small vessel ischemic changes. No suspicious parenchymal signal, masses, mass effect. No abnormal intraparenchymal or extra-axial enhancement. No abnormal extra-axial fluid collections. No extra-axial masses. VASCULAR: Normal major intracranial vascular flow voids present at skull base. SKULL AND UPPER CERVICAL SPINE: No abnormal sellar expansion. Similar heterogeneous bone marrow signal, potentially treatment related. Craniocervical junction maintained. SINUSES/ORBITS: RIGHT maxillary mucosal retention cyst. Minimal fluid signal LEFT mastoid air cell.The included ocular globes and orbital contents are non-suspicious. OTHER: None. IMPRESSION: 1. No  acute intracranial process or metastatic disease. 2. Old basal ganglia lacunar infarcts and mild chronic small vessel ischemic changes. 3. Mild parenchymal brain volume loss. Electronically Signed   By: CElon AlasM.D.   On: 10/24/2017 05:54   Ct Abdomen Pelvis W Contrast  Result Date: 10/21/2017 CLINICAL DATA:  Metastatic cervical cancer. Restaging. History of chemotherapy and radiation therapy. EXAM: CT ABDOMEN AND PELVIS WITH CONTRAST TECHNIQUE: Multidetector CT imaging of the abdomen and pelvis was performed using the standard protocol following bolus administration of intravenous contrast. CONTRAST:  1030mOMNIPAQUE IOHEXOL 300 MG/ML  SOLN COMPARISON:  08/08/2017 CT abdomen/pelvis. FINDINGS: Lower chest: Mild scarring versus atelectasis the right lung base. Hepatobiliary: Diffuse hepatic steatosis. Multiple (at least 5) heterogeneously hyperenhancing poorly marginated liver masses scattered throughout the liver, increased in size and number, with representative masses as follows: -dominant segment 5 right liver lobe 11.8 x 5.2 cm mass (series 2/image 31), increased from 8.0 x 3.4 cm -anterior segment 4A left liver lobe 2.4 x 2.4 cm mass (series 2/image 21), increased from 0.9 x 0.7 cm -segment 8 right liver lobe 1.9 x 1.7 cm mass (series 2/image 16), new -inferior segment 5 right liver lobe 2.2 x 2.0 cm mass (series 2/image 45), new Normal gallbladder with no radiopaque cholelithiasis. No biliary ductal dilatation. Pancreas: Normal, with no mass or duct dilation. Spleen: Normal size. No mass. Adrenals/Urinary Tract: Normal adrenals. Punctate nonobstructing interpolar right renal stone. No hydronephrosis. Scattered subcentimeter hypodense renal cortical lesions in both kidneys are too small to characterize and not appreciably changed. No new renal lesions. Collapsed normal bladder. Stomach/Bowel: Normal non-distended stomach. Normal caliber small bowel with no small bowel wall thickening. Normal  appendix. Normal large bowel with no diverticulosis, large bowel wall thickening or pericolonic fat stranding. Vascular/Lymphatic: Atherosclerotic nonaneurysmal abdominal aorta. Patent portal, splenic, hepatic and renal veins. No pathologically enlarged lymph nodes in the abdomen or pelvis. Reproductive: No adnexal mass. Stable mildly enlarged anteverted uterus. No discrete mass in the cervix by CT. Other: No pneumoperitoneum, ascites or focal fluid collection. Left pelvic sidewall 4.9 x 2.0 cm poorly marginated soft tissue mass (series 2/image 81), previously 4.8 x 2.9 cm, mildly decreased. Musculoskeletal: No aggressive appearing  focal osseous lesions. Mild thoracolumbar spondylosis. IMPRESSION: 1. Interval progression of liver metastases. 2. Poorly marginated left pelvic side wall soft tissue mass is mildly decreased in size. 3. Diffuse hepatic steatosis. 4.  Aortic Atherosclerosis (ICD10-I70.0). Electronically Signed   By: Ilona Sorrel M.D.   On: 10/21/2017 12:02    All questions were answered. The patient knows to call the clinic with any problems, questions or concerns. No barriers to learning was detected.  I spent 40 minutes counseling the patient face to face. The total time spent in the appointment was 55 minutes and more than 50% was on counseling and review of test results  [name], MD 11/01/2017 8:09 AM

## 2017-11-02 ENCOUNTER — Telehealth: Payer: Self-pay | Admitting: *Deleted

## 2017-11-02 ENCOUNTER — Ambulatory Visit: Payer: 59 | Admitting: Internal Medicine

## 2017-11-02 LAB — URINE CULTURE

## 2017-11-02 NOTE — Telephone Encounter (Signed)
This RN called patient for chemotherapy follow up call regarding first time Keytruda. Left a message on her cell phone to call [facility] if she was having any problems, concerns or had questions.

## 2017-11-02 NOTE — Telephone Encounter (Signed)
-----   Message from Johann Capers, RN sent at 10/31/2017  4:40 PM EDT ----- Regarding: Cindy Faulkner - Chemo Call Cindy Faulkner f/u call first-time [place]. Tolerated well.

## 2017-11-03 ENCOUNTER — Encounter: Payer: Self-pay | Admitting: Internal Medicine

## 2017-11-08 DIAGNOSIS — C801 Malignant (primary) neoplasm, unspecified: Secondary | ICD-10-CM | POA: Diagnosis not present

## 2017-11-11 ENCOUNTER — Other Ambulatory Visit: Payer: 59

## 2017-11-12 DIAGNOSIS — G4733 Obstructive sleep apnea (adult) (pediatric): Secondary | ICD-10-CM | POA: Diagnosis not present

## 2017-11-12 DIAGNOSIS — E440 Moderate protein-calorie malnutrition: Secondary | ICD-10-CM | POA: Diagnosis not present

## 2017-11-12 DIAGNOSIS — C801 Malignant (primary) neoplasm, unspecified: Secondary | ICD-10-CM | POA: Diagnosis not present

## 2017-11-12 DIAGNOSIS — C787 Secondary malignant neoplasm of liver and intrahepatic bile duct: Secondary | ICD-10-CM | POA: Diagnosis not present

## 2017-11-12 DIAGNOSIS — R112 Nausea with vomiting, unspecified: Secondary | ICD-10-CM | POA: Diagnosis not present

## 2017-11-12 DIAGNOSIS — C539 Malignant neoplasm of cervix uteri, unspecified: Secondary | ICD-10-CM | POA: Diagnosis not present

## 2017-11-14 ENCOUNTER — Ambulatory Visit: Payer: 59

## 2017-11-14 ENCOUNTER — Other Ambulatory Visit: Payer: Self-pay | Admitting: Hematology and Oncology

## 2017-11-14 ENCOUNTER — Ambulatory Visit: Payer: 59 | Admitting: Hematology and Oncology

## 2017-11-14 DIAGNOSIS — C801 Malignant (primary) neoplasm, unspecified: Secondary | ICD-10-CM | POA: Diagnosis not present

## 2017-11-15 ENCOUNTER — Inpatient Hospital Stay: Payer: 59

## 2017-11-15 ENCOUNTER — Other Ambulatory Visit: Payer: Self-pay

## 2017-11-15 ENCOUNTER — Inpatient Hospital Stay: Payer: 59 | Admitting: Hematology and Oncology

## 2017-11-15 ENCOUNTER — Telehealth: Payer: Self-pay

## 2017-11-15 ENCOUNTER — Other Ambulatory Visit: Payer: Self-pay | Admitting: Hematology and Oncology

## 2017-11-15 ENCOUNTER — Inpatient Hospital Stay (HOSPITAL_COMMUNITY)
Admission: AD | Admit: 2017-11-15 | Discharge: 2017-11-18 | DRG: 392 | Disposition: A | Discharge status: Home Health | Payer: 59 | Source: Ambulatory Visit | Attending: Hematology and Oncology | Admitting: Hematology and Oncology

## 2017-11-15 ENCOUNTER — Ambulatory Visit (HOSPITAL_COMMUNITY)
Admission: RE | Admit: 2017-11-15 | Discharge: 2017-11-15 | Disposition: A | Discharge status: Home / Self Care | Payer: 59 | Source: Ambulatory Visit | Attending: Hematology and Oncology | Admitting: Hematology and Oncology

## 2017-11-15 ENCOUNTER — Encounter (HOSPITAL_COMMUNITY): Payer: Self-pay

## 2017-11-15 DIAGNOSIS — Z7901 Long term (current) use of anticoagulants: Secondary | ICD-10-CM

## 2017-11-15 DIAGNOSIS — Z8673 Personal history of transient ischemic attack (TIA), and cerebral infarction without residual deficits: Secondary | ICD-10-CM

## 2017-11-15 DIAGNOSIS — Z79899 Other long term (current) drug therapy: Secondary | ICD-10-CM | POA: Diagnosis not present

## 2017-11-15 DIAGNOSIS — G4733 Obstructive sleep apnea (adult) (pediatric): Secondary | ICD-10-CM | POA: Diagnosis not present

## 2017-11-15 DIAGNOSIS — I4891 Unspecified atrial fibrillation: Secondary | ICD-10-CM | POA: Diagnosis present

## 2017-11-15 DIAGNOSIS — G4700 Insomnia, unspecified: Secondary | ICD-10-CM | POA: Diagnosis present

## 2017-11-15 DIAGNOSIS — E440 Moderate protein-calorie malnutrition: Secondary | ICD-10-CM | POA: Diagnosis not present

## 2017-11-15 DIAGNOSIS — R509 Fever, unspecified: Secondary | ICD-10-CM

## 2017-11-15 DIAGNOSIS — R112 Nausea with vomiting, unspecified: Secondary | ICD-10-CM | POA: Diagnosis not present

## 2017-11-15 DIAGNOSIS — Z5112 Encounter for antineoplastic immunotherapy: Secondary | ICD-10-CM | POA: Diagnosis not present

## 2017-11-15 DIAGNOSIS — I10 Essential (primary) hypertension: Secondary | ICD-10-CM | POA: Diagnosis present

## 2017-11-15 DIAGNOSIS — F419 Anxiety disorder, unspecified: Secondary | ICD-10-CM | POA: Diagnosis present

## 2017-11-15 DIAGNOSIS — Z9181 History of falling: Secondary | ICD-10-CM | POA: Diagnosis not present

## 2017-11-15 DIAGNOSIS — E785 Hyperlipidemia, unspecified: Secondary | ICD-10-CM | POA: Diagnosis present

## 2017-11-15 DIAGNOSIS — E860 Dehydration: Secondary | ICD-10-CM | POA: Diagnosis present

## 2017-11-15 DIAGNOSIS — R918 Other nonspecific abnormal finding of lung field: Secondary | ICD-10-CM

## 2017-11-15 DIAGNOSIS — I6330 Cerebral infarction due to thrombosis of unspecified cerebral artery: Secondary | ICD-10-CM

## 2017-11-15 DIAGNOSIS — R5381 Other malaise: Secondary | ICD-10-CM

## 2017-11-15 DIAGNOSIS — F332 Major depressive disorder, recurrent severe without psychotic features: Secondary | ICD-10-CM | POA: Diagnosis not present

## 2017-11-15 DIAGNOSIS — K760 Fatty (change of) liver, not elsewhere classified: Secondary | ICD-10-CM | POA: Diagnosis present

## 2017-11-15 DIAGNOSIS — Z87891 Personal history of nicotine dependence: Secondary | ICD-10-CM | POA: Diagnosis not present

## 2017-11-15 DIAGNOSIS — C801 Malignant (primary) neoplasm, unspecified: Secondary | ICD-10-CM

## 2017-11-15 DIAGNOSIS — Z923 Personal history of irradiation: Secondary | ICD-10-CM

## 2017-11-15 DIAGNOSIS — E119 Type 2 diabetes mellitus without complications: Secondary | ICD-10-CM

## 2017-11-15 DIAGNOSIS — Z8049 Family history of malignant neoplasm of other genital organs: Secondary | ICD-10-CM

## 2017-11-15 DIAGNOSIS — T380X5A Adverse effect of glucocorticoids and synthetic analogues, initial encounter: Secondary | ICD-10-CM

## 2017-11-15 DIAGNOSIS — G8929 Other chronic pain: Secondary | ICD-10-CM | POA: Diagnosis present

## 2017-11-15 DIAGNOSIS — C539 Malignant neoplasm of cervix uteri, unspecified: Secondary | ICD-10-CM

## 2017-11-15 DIAGNOSIS — Z7984 Long term (current) use of oral hypoglycemic drugs: Secondary | ICD-10-CM | POA: Diagnosis not present

## 2017-11-15 DIAGNOSIS — Z9221 Personal history of antineoplastic chemotherapy: Secondary | ICD-10-CM | POA: Diagnosis not present

## 2017-11-15 DIAGNOSIS — D72829 Elevated white blood cell count, unspecified: Secondary | ICD-10-CM | POA: Diagnosis present

## 2017-11-15 DIAGNOSIS — R4584 Anhedonia: Secondary | ICD-10-CM | POA: Diagnosis present

## 2017-11-15 DIAGNOSIS — R05 Cough: Secondary | ICD-10-CM | POA: Diagnosis not present

## 2017-11-15 DIAGNOSIS — G893 Neoplasm related pain (acute) (chronic): Secondary | ICD-10-CM | POA: Diagnosis not present

## 2017-11-15 DIAGNOSIS — C787 Secondary malignant neoplasm of liver and intrahepatic bile duct: Secondary | ICD-10-CM

## 2017-11-15 DIAGNOSIS — Z833 Family history of diabetes mellitus: Secondary | ICD-10-CM

## 2017-11-15 DIAGNOSIS — Z8249 Family history of ischemic heart disease and other diseases of the circulatory system: Secondary | ICD-10-CM

## 2017-11-15 DIAGNOSIS — Z823 Family history of stroke: Secondary | ICD-10-CM

## 2017-11-15 DIAGNOSIS — E099 Drug or chemical induced diabetes mellitus without complications: Secondary | ICD-10-CM

## 2017-11-15 DIAGNOSIS — E876 Hypokalemia: Secondary | ICD-10-CM

## 2017-11-15 DIAGNOSIS — Z6835 Body mass index (BMI) 35.0-35.9, adult: Secondary | ICD-10-CM | POA: Diagnosis not present

## 2017-11-15 LAB — COMPREHENSIVE METABOLIC PANEL
ALT: 22 U/L (ref 0–44)
ANION GAP: 13 (ref 5–15)
AST: 44 U/L — ABNORMAL HIGH (ref 15–41)
Albumin: 2.5 g/dL — ABNORMAL LOW (ref 3.5–5.0)
Alkaline Phosphatase: 148 U/L — ABNORMAL HIGH (ref 38–126)
BILIRUBIN TOTAL: 1.1 mg/dL (ref 0.3–1.2)
BUN: 7 mg/dL (ref 6–20)
CO2: 26 mmol/L (ref 22–32)
Calcium: 8.9 mg/dL (ref 8.9–10.3)
Chloride: 93 mmol/L — ABNORMAL LOW (ref 98–111)
Creatinine, Ser: 0.64 mg/dL (ref 0.44–1.00)
GFR calc Af Amer: >60 mL/min (ref >60)
Glucose, Bld: 136 mg/dL — ABNORMAL HIGH (ref 70–99)
POTASSIUM: 3.4 mmol/L — AB (ref 3.5–5.1)
Sodium: 132 mmol/L — ABNORMAL LOW (ref 135–145)
Total Protein: 7.5 g/dL (ref 6.5–8.1)

## 2017-11-15 LAB — URINALYSIS, COMPLETE (UACMP) WITH MICROSCOPIC
BILIRUBIN URINE: NEGATIVE
Glucose, UA: NEGATIVE mg/dL
Hgb urine dipstick: NEGATIVE
KETONES UR: 20 mg/dL — AB
LEUKOCYTES UA: NEGATIVE
Nitrite: NEGATIVE
PROTEIN: NEGATIVE mg/dL
Specific Gravity, Urine: 1.005 (ref 1.005–1.030)
pH: 6 (ref 5.0–8.0)

## 2017-11-15 LAB — MAGNESIUM: Magnesium: 1.3 mg/dL — CL (ref 1.7–2.4)

## 2017-11-15 LAB — CBC WITH DIFFERENTIAL/PLATELET
Basophils Absolute: 0 10*3/uL (ref 0.0–0.1)
Basophils Relative: 0 %
Eosinophils Absolute: 0 10*3/uL (ref 0.0–0.5)
Eosinophils Relative: 0 %
HEMATOCRIT: 33.6 % — AB (ref 34.8–46.6)
Hemoglobin: 10.4 g/dL — ABNORMAL LOW (ref 11.6–15.9)
LYMPHS PCT: 10 %
Lymphs Abs: 1.6 10*3/uL (ref 0.9–3.3)
MCH: 26.3 pg (ref 25.1–34.0)
MCHC: 31 g/dL — AB (ref 31.5–36.0)
MCV: 84.8 fL (ref 79.5–101.0)
MONO ABS: 1.6 10*3/uL — AB (ref 0.1–0.9)
MONOS PCT: 10 %
NEUTROS ABS: 12.3 10*3/uL — AB (ref 1.5–6.5)
Neutrophils Relative %: 80 %
Platelets: 112 10*3/uL — ABNORMAL LOW (ref 145–400)
RBC: 3.96 MIL/uL (ref 3.70–5.45)
RDW: 18.3 % — AB (ref 11.2–14.5)
WBC: 15.5 10*3/uL — ABNORMAL HIGH (ref 3.9–10.3)

## 2017-11-15 LAB — GLUCOSE, CAPILLARY: GLUCOSE-CAPILLARY: 116 mg/dL — AB (ref 70–99)

## 2017-11-15 LAB — TSH: TSH: 0.619 u[IU]/mL (ref 0.308–3.960)

## 2017-11-15 MED ORDER — RIVAROXABAN 20 MG PO TABS
20.0000 mg | ORAL_TABLET | Freq: Every day | ORAL | Status: DC
  Administered 2017-11-15 – 2017-11-17 (×3): 20 mg via ORAL
  Filled 2017-11-15 (×3): qty 1

## 2017-11-15 MED ORDER — METOPROLOL TARTRATE 50 MG PO TABS
50.0000 mg | ORAL_TABLET | Freq: Two times a day (BID) | ORAL | Status: DC
  Administered 2017-11-15 – 2017-11-18 (×7): 50 mg via ORAL
  Filled 2017-11-15 (×7): qty 1

## 2017-11-15 MED ORDER — ONDANSETRON HCL 4 MG PO TABS: 4.0000 mg | ORAL_TABLET | Freq: Three times a day (TID) | ORAL | Status: DC | PRN

## 2017-11-15 MED ORDER — RIVAROXABAN 20 MG PO TABS: 20.0000 mg | ORAL_TABLET | Freq: Every day | ORAL | Status: DC

## 2017-11-15 MED ORDER — HYDROMORPHONE HCL 4 MG PO TABS: 4.0000 mg | ORAL_TABLET | Freq: Four times a day (QID) | ORAL | Status: DC | PRN

## 2017-11-15 MED ORDER — MAGNESIUM SULFATE 4 GM/100ML IV SOLN
4.0000 g | Freq: Once | INTRAVENOUS | Status: AC
  Administered 2017-11-15: 4 g via INTRAVENOUS
  Filled 2017-11-15: qty 100

## 2017-11-15 MED ORDER — INSULIN ASPART 100 UNIT/ML ~~LOC~~ SOLN
0.0000 [IU] | Freq: Three times a day (TID) | SUBCUTANEOUS | Status: DC
  Administered 2017-11-16 (×3): 2 [IU] via SUBCUTANEOUS
  Administered 2017-11-17: 4 [IU] via SUBCUTANEOUS
  Administered 2017-11-18: 2 [IU] via SUBCUTANEOUS

## 2017-11-15 MED ORDER — ONDANSETRON 4 MG PO TBDP: 4.0000 mg | ORAL_TABLET | Freq: Three times a day (TID) | ORAL | Status: DC

## 2017-11-15 MED ORDER — GABAPENTIN 300 MG PO CAPS
300.0000 mg | ORAL_CAPSULE | Freq: Every day | ORAL | Status: DC
  Administered 2017-11-15 – 2017-11-18 (×4): 300 mg via ORAL
  Filled 2017-11-15 (×4): qty 1

## 2017-11-15 MED ORDER — SENNOSIDES-DOCUSATE SODIUM 8.6-50 MG PO TABS
1.0000 | ORAL_TABLET | Freq: Every evening | ORAL | Status: DC | PRN
  Filled 2017-11-15: qty 1

## 2017-11-15 MED ORDER — ACETAMINOPHEN 325 MG PO TABS
650.0000 mg | ORAL_TABLET | ORAL | Status: DC | PRN
  Administered 2017-11-16: 650 mg via ORAL
  Filled 2017-11-15: qty 2

## 2017-11-15 MED ORDER — ONDANSETRON HCL 4 MG/2ML IJ SOLN: 4.0000 mg | Freq: Three times a day (TID) | INTRAMUSCULAR | Status: DC | PRN

## 2017-11-15 MED ORDER — DEXTROSE 5 % IV SOLN
INTRAVENOUS | Status: DC
  Administered 2017-11-15 – 2017-11-17 (×3): via INTRAVENOUS

## 2017-11-15 MED ORDER — ONDANSETRON 4 MG PO TBDP: 4.0000 mg | ORAL_TABLET | Freq: Three times a day (TID) | ORAL | Status: DC | PRN

## 2017-11-15 MED ORDER — DRONABINOL 5 MG PO CAPS
5.0000 mg | ORAL_CAPSULE | Freq: Two times a day (BID) | ORAL | Status: DC
  Administered 2017-11-15 – 2017-11-18 (×6): 5 mg via ORAL
  Filled 2017-11-15 (×6): qty 1

## 2017-11-15 MED ORDER — ONDANSETRON HCL 4 MG/2ML IJ SOLN
4.0000 mg | Freq: Three times a day (TID) | INTRAMUSCULAR | Status: DC
  Administered 2017-11-15: 4 mg via INTRAVENOUS
  Filled 2017-11-15: qty 2

## 2017-11-15 MED ORDER — CITALOPRAM HYDROBROMIDE 20 MG PO TABS
40.0000 mg | ORAL_TABLET | Freq: Every day | ORAL | Status: DC
  Administered 2017-11-15 – 2017-11-18 (×4): 40 mg via ORAL
  Filled 2017-11-15 (×4): qty 2

## 2017-11-15 MED ORDER — ALPRAZOLAM 0.25 MG PO TABS
0.2500 mg | ORAL_TABLET | Freq: Three times a day (TID) | ORAL | Status: DC | PRN
  Filled 2017-11-15: qty 1

## 2017-11-15 MED ORDER — ONDANSETRON HCL 4 MG PO TABS
4.0000 mg | ORAL_TABLET | Freq: Three times a day (TID) | ORAL | Status: DC
  Administered 2017-11-15 – 2017-11-16 (×2): 8 mg via ORAL
  Filled 2017-11-15 (×2): qty 2

## 2017-11-15 MED ORDER — SODIUM CHLORIDE 0.9 % IV SOLN: 8.0000 mg | Freq: Three times a day (TID) | INTRAVENOUS | Status: DC | PRN

## 2017-11-15 MED ORDER — GABAPENTIN 300 MG PO CAPS
600.0000 mg | ORAL_CAPSULE | Freq: Every day | ORAL | Status: DC
  Administered 2017-11-15 – 2017-11-17 (×3): 600 mg via ORAL
  Filled 2017-11-15 (×3): qty 2

## 2017-11-15 MED ORDER — SODIUM CHLORIDE 0.9 % IV SOLN
8.0000 mg | Freq: Three times a day (TID) | INTRAVENOUS | Status: DC
  Filled 2017-11-15 (×4): qty 4

## 2017-11-15 MED ORDER — FLECAINIDE ACETATE 50 MG PO TABS
50.0000 mg | ORAL_TABLET | Freq: Every day | ORAL | Status: DC
  Administered 2017-11-15 – 2017-11-18 (×4): 50 mg via ORAL
  Filled 2017-11-15 (×4): qty 1

## 2017-11-15 NOTE — Telephone Encounter (Signed)
Called and gave below message to daughter. She verbalized understanding. Scheduling message sent for 1130 lab and 1200 Dr. Alvy Bimler appts today. Daughter will take Mrs. Abram to radiology prior to appt for chest xray.

## 2017-11-15 NOTE — Progress Notes (Signed)
This encounter was created in error - please disregard.

## 2017-11-15 NOTE — Telephone Encounter (Signed)
She should be seen today I have 12 pm open Will need CXR, labs and see me; if she's ok to come in I will order tests but proceed to place scheduling msg

## 2017-11-15 NOTE — H&P (Signed)
[facility] ADMISSION NOTE  Patient Care Team: Cleda Mccreedy as PCP - General (Nurse Practitioner) Arvella Nigh, MD as Obstetrician (Obstetrics and Gynecology) Evans Lance, MD as Consulting Physician (Cardiology)  CHIEF COMPLAINTS/PURPOSE OF ADMISSION Significant decline of overall performance status, severe depression, intractable nausea and vomiting, on background history of metastatic cervical cancer, failed outpatient therapy  HISTORY OF PRESENTING ILLNESS:  Cindy Faulkner 52 y.o. female is admitted for aggressive symptom management due to overall decline in performance status.  She was directly admitted from the outpatient clinic due to failed outpatient therapy. Summary of oncologic history as follows: Oncology History   Biothernostics: 96% probability cervical cancer  Additional evaluation revealed PTEN deletion, negative for MET amplification, PD-L1 expression of 60     Metastasis to liver ([place])   03/03/2017 Initial Diagnosis    Metastasis to liver ([place])    08/08/2017 - 11/02/2017 Chemotherapy    The patient had bevacizumab (AVASTIN) 1,800 mg in sodium chloride 0.9 % 100 mL chemo infusion, 15 mg/kg = 1,800 mg, Intravenous,  Once, 4 of 5 cycles Administration: 1,800 mg (08/09/2017), 1,800 mg (09/01/2017), 1,800 mg (09/22/2017), 1,800 mg (10/13/2017)  for chemotherapy treatment.     10/25/2017 -  Chemotherapy    The patient had pembrolizumab (KEYTRUDA) 200 mg in sodium chloride 0.9 % 50 mL chemo infusion, 200 mg, Intravenous, Once, 1 of 6 cycles Administration: 200 mg (10/31/2017)  for chemotherapy treatment.      Cancer of unknown origin ([place])   03/03/2017 Imaging    1. Multifocal liver metastasis. 2. Left posterior pelvic mass extending into the obturator foramen measures up to 7 cm. Suspicious for malignancy. This should be easily amendable to percutaneous tissue sampling under image guidance. 3. No lytic bone lesions identified of myeloma.    03/09/2017  Procedure    Technically successful ultrasound-guided core liver lesion biopsy    03/09/2017 Pathology Results    A. LIVER LESION, RIGHT LOBE; ULTRASOUND-GUIDED BIOPSY:  - METASTATIC SQUAMOUS CELL CARCINOMA, SEE COMMENT.   Comment: Metastatic carcinoma is immunoreactive for p40, p16, p53 and pancytokeratin while CK7, CK20, and TTF1 are negative. Significance of p16 positivity depends on the site of origin and the pattern of immunoreactivity in this material is non-specific. Possible sites of origin include gynecologic, anal, lung, and head/neck. Correlation with physical exam and additional imaging is required.     03/18/2017 Pathology Results    She had GYN exam and pap smear. Result is negative for malignancy    03/21/2017 - 04/01/2017 Hospital Admission    She was admitted to the hospital for pain management and had radiation therapy    03/23/2017 Procedure    Ultrasound and fluoroscopically guided right internal jugular single lumen power port catheter insertion. Tip in the SVC/RA junction. Catheter ready for use.    05/16/2017 Imaging    Mild decrease in size of left posterior pelvic soft tissue mass and liver metastases. No new or progressive metastatic disease identified.  Stable hepatic steatosis and tiny nonobstructing right renal calculus.    08/08/2017 Imaging    Decreased liver metastases.  Decreased size of left internal iliac mass or lymphadenopathy, which abuts the left sacrum.  No new or progressive metastatic disease.  Stable severe hepatic steatosis.    08/08/2017 - 11/02/2017 Chemotherapy    The patient had bevacizumab (AVASTIN) 1,800 mg in sodium chloride 0.9 % 100 mL chemo infusion, 15 mg/kg = 1,800 mg, Intravenous,  Once, 4 of 5 cycles Administration: 1,800 mg (  08/09/2017), 1,800 mg (09/01/2017), 1,800 mg (09/22/2017), 1,800 mg (10/13/2017)  for chemotherapy treatment.     10/21/2017 Imaging    1. Interval progression of liver metastases. 2. Poorly marginated left  pelvic side wall soft tissue mass is mildly decreased in size. 3. Diffuse hepatic steatosis. 4. Aortic Atherosclerosis (ICD10-I70.0).    10/24/2017 Imaging    1. No acute intracranial process or metastatic disease. 2. Old basal ganglia lacunar infarcts and mild chronic small vessel ischemic changes. 3. Mild parenchymal brain volume loss.     10/25/2017 -  Chemotherapy    The patient had pembrolizumab (KEYTRUDA) 200 mg in sodium chloride 0.9 % 50 mL chemo infusion, 200 mg, Intravenous, Once, 1 of 6 cycles Administration: 200 mg (10/31/2017)  for chemotherapy treatment.      Cervical cancer ([place])   07/20/2017 Initial Diagnosis    Cervical cancer ([place])    08/08/2017 - 11/02/2017 Chemotherapy    The patient had bevacizumab (AVASTIN) 1,800 mg in sodium chloride 0.9 % 100 mL chemo infusion, 15 mg/kg = 1,800 mg, Intravenous,  Once, 4 of 5 cycles Administration: 1,800 mg (08/09/2017), 1,800 mg (09/01/2017), 1,800 mg (09/22/2017), 1,800 mg (10/13/2017)  for chemotherapy treatment.     10/25/2017 -  Chemotherapy    The patient had pembrolizumab (KEYTRUDA) 200 mg in sodium chloride 0.9 % 50 mL chemo infusion, 200 mg, Intravenous, Once, 1 of 6 cycles Administration: 200 mg (10/31/2017)  for chemotherapy treatment.     I was informed by home health nursing staff that the patient had a fall at home yesterday.  According to patient and family members, the reason for the fall is due to profound weakness. From our previous visit, we have discussed the importance of physical therapy and rehabilitation.  The patient has repeatedly refused to participate with PT/rehab due to feeling unwell. She complained of constant nausea and vomiting.  Previously, I have recommended scopolamine patch, round-the-clock Zofran, lorazepam as needed, intravenous IV fluid support at home along with IV antiemetics and also Marinol.  According to her husband, he has attempted to take her off some of the medications because he did not find  those medications has been helpful but has been doing round-the-clock scheduled Zofran.  The patient is noted to be excessively sedated when she was given Phenergan or Compazine or Reglan. Her oral intake is poor, amounted to less than a fist full of food over the last 4 days due to lack of appetite.  According to her, the normal saline IV fluid infusion is causing her to have altered taste sensation and everything felt salty for her. Her daughter is crying nonstop in the office stating that the patient is very withdrawn and depressed.  1 of her close cousin was visiting last week and the patient appears to be cheerful but as soon as her cousin has left her house, the patient had reverted back to a very withdrawn state.  Her husband is asking for help whether other forms of antidepressants might be helpful.  The patient denies suicidal ideation but felt hopeless due to significant symptoms.  She continues to have intermittent right upper quadrant pain but does not take pain medicine on a regular basis.  She denies constipation.  Recently, she was noted to have some low-grade fever without localizing signs such as sore throat, cough or dysuria.  MEDICAL HISTORY:  Past Medical History:  Diagnosis Date  . Atrial fibrillation ([place])    a. echo 4/07: EF 60%  . Atrial tachycardia ([place])  a. s/p EPS 4/09: no inducible SVT - med Tx continued  . Cancer ([place])   . Complication of anesthesia    age 58yo, woke during procedure with GA, paralysed   . Endometrial polyp   . Hyperlipidemia   . Hypertension   . PONV (postoperative nausea and vomiting)   . Rectocele    WITHOUT MENTION OF UTERINE PROLAPSE  . Stroke ([place])   . SVD (spontaneous vaginal delivery)    x 1    SURGICAL HISTORY: Past Surgical History:  Procedure Laterality Date  . COLONOSCOPY    . HYSTEROSCOPY    . IR FLUORO GUIDE PORT INSERTION RIGHT  03/23/2017  . IR US GUIDE VASC ACCESS RIGHT  03/23/2017  . LEEP    . Pylonidal cystect    .  TUBAL LIGATION    . WISDOM TOOTH EXTRACTION      SOCIAL HISTORY: Social History   Socioeconomic History  . Marital status: Married    Spouse name: Kasandra Knudsen  . Number of children: 1  . Years of education: Not on file  . Highest education level: Not on file  Occupational History  . Occupation: nurse  Social Needs  . Financial resource strain: Not on file  . Food insecurity:    Worry: Not on file    Inability: Not on file  . Transportation needs:    Medical: Not on file    Non-medical: Not on file  Tobacco Use  . Smoking status: Former Smoker    Packs/day: 0.50    Years: 10.00    Pack years: 5.00    Types: Cigarettes    Last attempt to quit: 02/22/1998    Years since quitting: 19.7  . Smokeless tobacco: Never Used  Substance and Sexual Activity  . Alcohol use: No  . Drug use: No  . Sexual activity: Not on file  Lifestyle  . Physical activity:    Days per week: Not on file    Minutes per session: Not on file  . Stress: Not on file  Relationships  . Social connections:    Talks on phone: Not on file    Gets together: Not on file    Attends religious service: Not on file    Active member of club or organization: Not on file    Attends meetings of clubs or organizations: Not on file    Relationship status: Not on file  . Intimate partner violence:    Fear of current or ex partner: Not on file    Emotionally abused: Not on file    Physically abused: Not on file    Forced sexual activity: Not on file  Other Topics Concern  . Not on file  Social History Narrative  . Not on file    FAMILY HISTORY: Family History  Problem Relation Age of Onset  . Hypertension Father   . Diabetes Father   . Stroke Father   . Hypertension Mother   . Stroke Mother   . Cancer Mother        cervical ca  . Cancer Sister 14       uterine ca  . Cancer Maternal Grandmother        lung ca    ALLERGIES:  has No Known Allergies.  MEDICATIONS:  Current Facility-Administered  Medications  Medication Dose Route Frequency Provider Last Rate Last Dose  . acetaminophen (TYLENOL) tablet 650 mg  650 mg Oral Q4H PRN [name], MD      . ALPRAZolam (  XANAX) tablet 0.25 mg  0.25 mg Oral TID PRN Alvy Bimler, Monie Shere, MD      . citalopram (CELEXA) tablet 40 mg  40 mg Oral Daily Alvy Bimler, Benen Weida, MD   40 mg at 11/15/17 1559  . dextrose 5 % solution   Intravenous Continuous [name], MD 50 mL/hr at 11/15/17 1529    . dronabinol (MARINOL) capsule 5 mg  5 mg Oral BID AC Maneh Sieben, MD   5 mg at 11/15/17 1617  . flecainide (TAMBOCOR) tablet 50 mg  50 mg Oral Daily Alvy Bimler, Howell Groesbeck, MD   50 mg at 11/15/17 1617  . gabapentin (NEURONTIN) capsule 300 mg  300 mg Oral Daily Alvy Bimler, Vetta Couzens, MD   300 mg at 11/15/17 1600  . gabapentin (NEURONTIN) capsule 600 mg  600 mg Oral QHS Leman Martinek, MD      . HYDROmorphone (DILAUDID) tablet 4 mg  4 mg Oral Q6H PRN Kimberlly Norgard, MD      . insulin aspart (novoLOG) injection 0-24 Units  0-24 Units Subcutaneous TID WC Carinne Brandenburger, MD      . magnesium sulfate IVPB 4 g 100 mL  4 g Intravenous Once Alvy Bimler, Imoni Kohen, MD 50 mL/hr at 11/15/17 1604 4 g at 11/15/17 1604  . metoprolol tartrate (LOPRESSOR) tablet 50 mg  50 mg Oral BID Alvy Bimler, Hikari Tripp, MD   50 mg at 11/15/17 1617  . ondansetron (ZOFRAN) 8 mg in sodium chloride 0.9 % 50 mL IVPB  8 mg Intravenous Q8H Serrena Linderman, MD       Or  . ondansetron (ZOFRAN) tablet 4-8 mg  4-8 mg Oral Q8H Crestina Strike, MD       Or  . ondansetron (ZOFRAN-ODT) disintegrating tablet 4-8 mg  4-8 mg Oral Q8H Jacqulyn Barresi, MD       Or  . ondansetron (ZOFRAN) injection 4 mg  4 mg Intravenous Q8H Elky Funches, MD   4 mg at 11/15/17 1530  . rivaroxaban (XARELTO) tablet 20 mg  20 mg Oral Q supper [name], MD   20 mg at 11/15/17 1617  . senna-docusate (Senokot-S) tablet 1 tablet  1 tablet Oral QHS PRN [name], MD        REVIEW OF SYSTEMS:   Eyes: Denies blurriness of vision, double vision or watery eyes Ears, nose, mouth, throat, and face: Denies  mucositis or sore throat Respiratory: Denies cough, dyspnea or wheezes Cardiovascular: Denies palpitation, chest discomfort or lower extremity swelling Skin: Denies abnormal skin rashes Lymphatics: Denies new lymphadenopathy or easy bruising  All other systems were reviewed with the patient and are negative.  PHYSICAL EXAMINATION: ECOG PERFORMANCE STATUS: 3 - Symptomatic, >50% confined to bed  Vitals:   11/15/17 1544  BP: (!) 141/77  Pulse: 84  Resp: 17  Temp: 98.2 F (36.8 C)  SpO2: 90%   There were no vitals filed for this visit.  GENERAL:alert, no distress and comfortable. SKIN: skin color, texture, turgor are normal, no rashes or significant lesions EYES: normal, conjunctiva are pink and non-injected, sclera clear OROPHARYNX:no exudate, no erythema and lips, buccal mucosa, and tongue normal  NECK: supple, thyroid normal size, non-tender, without nodularity LYMPH:  no palpable lymphadenopathy in the cervical, axillary or inguinal LUNGS: clear to auscultation and percussion with normal breathing effort HEART: regular rate & rhythm and no murmurs and no lower extremity edema ABDOMEN:abdomen soft, mild discomfort but without rebound or guarding. Musculoskeletal:no cyanosis of digits and no clubbing  PSYCH: alert & oriented x 3 with fluent speech NEURO:  no focal motor/sensory deficits  LABORATORY DATA:  I have reviewed the data as listed Lab Results  Component Value Date   WBC 15.5 (H) 11/15/2017   HGB 10.4 (L) 11/15/2017   HCT 33.6 (L) 11/15/2017   MCV 84.8 11/15/2017   PLT 112 (L) 11/15/2017   Recent Labs    10/24/17 0015 10/31/17 1413 11/15/17 1233  NA 130* 134* 132*  K 4.1 3.9 3.4*  CL 93* 97* 93*  CO2 _0 GLUCOSE 169* 176* 136*  BUN _1 CREATININE 0.81 0.79 0.64  CALCIUM 8.4* 9.7 8.9  GFRNONAA >60 >60 >60  GFRAA >60 >60 >60  PROT 6.8 8.2* 7.5  ALBUMIN 2.5* 2.9* 2.5*  AST 38 37 44*  ALT _2 ALKPHOS 109 141* 148*  BILITOT 0.7 1.0 1.1     RADIOGRAPHIC STUDIES: I have personally reviewed the radiological images as listed and agreed with the findings in the report. Dg Chest 2 View  Result Date: 11/15/2017 CLINICAL DATA:  Fever.  Cough and congestion. EXAM: CHEST - 2 VIEW COMPARISON:  06/16/2017. FINDINGS: PowerPort catheter with tip over superior vena cava. Heart size normal. Mild bibasilar atelectasis and/or scarring. No pleural effusion or pneumothorax. Degenerative change thoracic spine. IMPRESSION: 1.  PowerPort catheter with tip over superior vena cava. 2.  Mild bibasilar atelectasis and or scarring. Electronically Signed   By: Marcello Moores  Register   On: 11/15/2017 12:05   Ct Head Wo Contrast  Result Date: 10/24/2017 CLINICAL DATA:  52 year old female with history of stroke presenting with focal neurological deficit. EXAM: CT HEAD WITHOUT CONTRAST TECHNIQUE: Contiguous axial images were obtained from the base of the skull through the vertex without intravenous contrast. COMPARISON:  Brain MRI dated 03/23/2017 FINDINGS: Brain: The ventricles and sulci appropriate size for patient's age. Subcentimeter hypodense focus in the left internal capsule medial to the left lentiform nucleus similar to prior MRI most consistent with a small old infarct. There is no acute intracranial hemorrhage. No mass effect or midline shift. No extra-axial fluid collection. Vascular: No hyperdense vessel or unexpected calcification. Skull: Normal. Negative for fracture or focal lesion. Sinuses/Orbits: Partially visualized right maxillary sinus retention cyst or polyp. The remainder of the visualized paranasal sinuses and mastoid air cells are clear. Other: None IMPRESSION: 1. No acute intracranial pathology. 2. Small focus of old lacunar infarct in the left internal capsule. Electronically Signed   By: Anner Crete M.D.   On: 10/24/2017 00:45   Mr Jeri Cos And Wo Contrast  Result Date: 10/24/2017 CLINICAL DATA:  Feeling unwell, symptoms similar to prior  stroke. Nausea and vomiting for 3 weeks. History of metastatic cervical cancer, on chemotherapy, hypertension, hyperlipidemia. EXAM: MRI HEAD WITHOUT AND WITH CONTRAST TECHNIQUE: Multiplanar, multiecho pulse sequences of the brain and surrounding structures were obtained without and with intravenous contrast. CONTRAST:  8m MULTIHANCE GADOBENATE DIMEGLUMINE 529 MG/ML IV SOLN COMPARISON:  CT HEAD October 24, 2017 and MRI of the head March 23, 2017. FINDINGS: INTRACRANIAL CONTENTS: No reduced diffusion to suggest acute ischemia or hypercellular tumor. No susceptibility artifact to suggest hemorrhage. Mild parenchymal brain volume loss. No hydrocephalus. Old bilateral basal ganglia lacunar infarcts. A few scattered subcentimeter supratentorial white matter FLAIR T2 hyperintensities compatible with mild chronic small vessel ischemic changes. No suspicious parenchymal signal, masses, mass effect. No abnormal intraparenchymal or extra-axial enhancement. No abnormal extra-axial fluid collections. No extra-axial masses. VASCULAR: Normal major intracranial vascular flow voids present at skull base. SKULL AND UPPER CERVICAL SPINE:  No abnormal sellar expansion. Similar heterogeneous bone marrow signal, potentially treatment related. Craniocervical junction maintained. SINUSES/ORBITS: RIGHT maxillary mucosal retention cyst. Minimal fluid signal LEFT mastoid air cell.The included ocular globes and orbital contents are non-suspicious. OTHER: None. IMPRESSION: 1. No acute intracranial process or metastatic disease. 2. Old basal ganglia lacunar infarcts and mild chronic small vessel ischemic changes. 3. Mild parenchymal brain volume loss. Electronically Signed   By: Elon Alas M.D.   On: 10/24/2017 05:54   Ct Abdomen Pelvis W Contrast  Result Date: 10/21/2017 CLINICAL DATA:  Metastatic cervical cancer. Restaging. History of chemotherapy and radiation therapy. EXAM: CT ABDOMEN AND PELVIS WITH CONTRAST TECHNIQUE:  Multidetector CT imaging of the abdomen and pelvis was performed using the standard protocol following bolus administration of intravenous contrast. CONTRAST:  148m OMNIPAQUE IOHEXOL 300 MG/ML  SOLN COMPARISON:  08/08/2017 CT abdomen/pelvis. FINDINGS: Lower chest: Mild scarring versus atelectasis the right lung base. Hepatobiliary: Diffuse hepatic steatosis. Multiple (at least 5) heterogeneously hyperenhancing poorly marginated liver masses scattered throughout the liver, increased in size and number, with representative masses as follows: -dominant segment 5 right liver lobe 11.8 x 5.2 cm mass (series 2/image 31), increased from 8.0 x 3.4 cm -anterior segment 4A left liver lobe 2.4 x 2.4 cm mass (series 2/image 21), increased from 0.9 x 0.7 cm -segment 8 right liver lobe 1.9 x 1.7 cm mass (series 2/image 16), new -inferior segment 5 right liver lobe 2.2 x 2.0 cm mass (series 2/image 45), new Normal gallbladder with no radiopaque cholelithiasis. No biliary ductal dilatation. Pancreas: Normal, with no mass or duct dilation. Spleen: Normal size. No mass. Adrenals/Urinary Tract: Normal adrenals. Punctate nonobstructing interpolar right renal stone. No hydronephrosis. Scattered subcentimeter hypodense renal cortical lesions in both kidneys are too small to characterize and not appreciably changed. No new renal lesions. Collapsed normal bladder. Stomach/Bowel: Normal non-distended stomach. Normal caliber small bowel with no small bowel wall thickening. Normal appendix. Normal large bowel with no diverticulosis, large bowel wall thickening or pericolonic fat stranding. Vascular/Lymphatic: Atherosclerotic nonaneurysmal abdominal aorta. Patent portal, splenic, hepatic and renal veins. No pathologically enlarged lymph nodes in the abdomen or pelvis. Reproductive: No adnexal mass. Stable mildly enlarged anteverted uterus. No discrete mass in the cervix by CT. Other: No pneumoperitoneum, ascites or focal fluid collection.  Left pelvic sidewall 4.9 x 2.0 cm poorly marginated soft tissue mass (series 2/image 81), previously 4.8 x 2.9 cm, mildly decreased. Musculoskeletal: No aggressive appearing focal osseous lesions. Mild thoracolumbar spondylosis. IMPRESSION: 1. Interval progression of liver metastases. 2. Poorly marginated left pelvic side wall soft tissue mass is mildly decreased in size. 3. Diffuse hepatic steatosis. 4.  Aortic Atherosclerosis (ICD10-I70.0). Electronically Signed   By: JIlona SorrelM.D.   On: 10/21/2017 12:02    ASSESSMENT & PLAN:  Metastatic cervical cancer to the liver She tolerated treatment well but has numerous symptoms which I believe is unrelated to her treatment.  Continue aggressive supportive care.  Hypomagnesemia She continues to have persistent hypomagnesemia despite oral magnesium replacement therapy I will order IV magnesium infusion and will continue to check her magnesium level daily.  Fever of unknown etiology Blood culture and urine culture is pending.  Chest x-ray is unremarkable for infection.  She has leukocytosis.  I recommend close observation only.  Intractable nausea with vomiting She continues to have uncontrolled nausea and vomiting despite minimal oral intake and not being on chemotherapy I recommend we continue on Marinol.  Recent MRI of the brain has excluded intracranial metastasis I  will schedule IV fluids along with IV antiemetics.  Due to her dislike for normal saline, I will change it to 5% dextrose.  Diabetes mellitus I will monitor her blood sugar carefully while she is on IV fluids.  If her blood sugar starts to trend high, I will change her dextrose to D5W or half normal saline I will check her blood sugar regularly and put her on insulin sliding scale  Significant, poorly controlled major depressive disorder ([place]) She has significant depression and seems to be withdrawn from family members I had a lot of discussion with the patient and family and  continue to encourage her to participate in family activities. She denies suicidal ideation I have consulted inpatient psychiatrist to come and evaluate the patient along with recommendation about treatment options.  Recent fall, significant debility I will consult physical therapy for evaluation and treatment while hospitalized  Poor oral intake The patient had tried Remeron in the past.  I am concerned about putting her on Megace due to risk of DVT.  Will consult dietitian.  History of stroke She will continue Xarelto  Goals of care, counseling/discussion We had extensive goals of care discussion She understood treatment goal is palliative The patient is not willing to consider hospice yet. She has failed outpatient therapy and needs to be admitted for aggressive evaluation.  If her symptoms remain unchanged despite aggressive therapy, I plan to repeat CT imaging to exclude cancer progression  All questions were answered.      [name], MD 11/15/2017 4:34 PM

## 2017-11-15 NOTE — Progress Notes (Signed)
I had a brief visit w/Cindy Faulkner as she was resting upon my arrival. I had prayer w/her and she was very grateful for the visit and prayer. Please page if assistance is needed at any time. [place], [place]   11/15/17 2000  Clinical Encounter Type  Visited With Family

## 2017-11-15 NOTE — Telephone Encounter (Signed)
Husband called and left a message to call him. His wife temperature went up to 102.1 at 3 am today.  Called back. Husband called answering service at 3 am today regarding temperature of 102.1. Was advised to not take tylenol and to go to ER. Temperature rechecked at 3:30 am and it was coming down. His wife declined to go to ER.  Temperature this morning is 99.7 and she has been running a low grade fever. She complains of nausea on and off. She takes her Zofran and Marinol. Ask him about his wife fall yesterday at home. He said she is fine and she caught her foot on a blanket and fell/

## 2017-11-16 DIAGNOSIS — Z87891 Personal history of nicotine dependence: Secondary | ICD-10-CM

## 2017-11-16 DIAGNOSIS — F332 Major depressive disorder, recurrent severe without psychotic features: Secondary | ICD-10-CM

## 2017-11-16 DIAGNOSIS — G4700 Insomnia, unspecified: Secondary | ICD-10-CM

## 2017-11-16 LAB — CBC WITH DIFFERENTIAL/PLATELET
BASOS PCT: 0 %
Basophils Absolute: 0 10*3/uL (ref 0.0–0.1)
EOS ABS: 0 10*3/uL (ref 0.0–0.7)
Eosinophils Relative: 0 %
HCT: 29.7 % — ABNORMAL LOW (ref 36.0–46.0)
HEMOGLOBIN: 9.1 g/dL — AB (ref 12.0–15.0)
LYMPHS ABS: 1.3 10*3/uL (ref 0.7–4.0)
Lymphocytes Relative: 12 %
MCH: 26.1 pg (ref 26.0–34.0)
MCHC: 30.6 g/dL (ref 30.0–36.0)
MCV: 85.1 fL (ref 78.0–100.0)
MONO ABS: 1.2 10*3/uL (ref 0.1–1.0)
MONOS PCT: 12 %
NEUTROS PCT: 76 %
Neutro Abs: 8.2 10*3/uL (ref 1.7–7.7)
Platelets: UNDETERMINED 10*3/uL (ref 150–400)
RBC: 3.49 MIL/uL — ABNORMAL LOW (ref 3.87–5.11)
RDW: 18.1 % — ABNORMAL HIGH (ref 11.5–15.5)
WBC: 10.8 10*3/uL — ABNORMAL HIGH (ref 4.0–10.5)

## 2017-11-16 LAB — BASIC METABOLIC PANEL
ANION GAP: 9 (ref 5–15)
BUN: 6 mg/dL (ref 6–20)
CALCIUM: 8.4 mg/dL — AB (ref 8.9–10.3)
CO2: 31 mmol/L (ref 22–32)
Chloride: 93 mmol/L — ABNORMAL LOW (ref 98–111)
Creatinine, Ser: 0.52 mg/dL (ref 0.44–1.00)
GFR calc Af Amer: >60 mL/min (ref >60)
GLUCOSE: 130 mg/dL — AB (ref 70–99)
Potassium: 3.3 mmol/L — ABNORMAL LOW (ref 3.5–5.1)
Sodium: 133 mmol/L — ABNORMAL LOW (ref 135–145)

## 2017-11-16 LAB — GLUCOSE, CAPILLARY
GLUCOSE-CAPILLARY: 152 mg/dL — AB (ref 70–99)
GLUCOSE-CAPILLARY: 127 mg/dL — AB (ref 70–99)
Glucose-Capillary: 138 mg/dL — ABNORMAL HIGH (ref 70–99)

## 2017-11-16 LAB — MAGNESIUM: Magnesium: 1.7 mg/dL (ref 1.7–2.4)

## 2017-11-16 MED ORDER — ONDANSETRON HCL 4 MG/2ML IJ SOLN
4.0000 mg | Freq: Three times a day (TID) | INTRAMUSCULAR | Status: DC
  Administered 2017-11-16 – 2017-11-17 (×3): 4 mg via INTRAVENOUS
  Filled 2017-11-16 (×3): qty 2

## 2017-11-16 MED ORDER — ONDANSETRON 4 MG PO TBDP
4.0000 mg | ORAL_TABLET | Freq: Three times a day (TID) | ORAL | Status: DC
  Administered 2017-11-17 – 2017-11-18 (×3): 4 mg via ORAL
  Filled 2017-11-16 (×3): qty 1

## 2017-11-16 MED ORDER — CLONAZEPAM 0.5 MG PO TABS
0.5000 mg | ORAL_TABLET | Freq: Every day | ORAL | Status: DC
  Administered 2017-11-17 (×2): 0.5 mg via ORAL
  Filled 2017-11-16 (×2): qty 1

## 2017-11-16 MED ORDER — PREMIER PROTEIN SHAKE
11.0000 [oz_av] | Freq: Two times a day (BID) | ORAL | Status: DC
  Administered 2017-11-16 – 2017-11-18 (×4): 11 [oz_av] via ORAL
  Filled 2017-11-16 (×5): qty 325.31

## 2017-11-16 MED ORDER — MIRTAZAPINE 15 MG PO TBDP
7.5000 mg | ORAL_TABLET | Freq: Every day | ORAL | Status: DC
  Administered 2017-11-17 (×2): 7.5 mg via ORAL
  Filled 2017-11-16 (×2): qty 0.5

## 2017-11-16 MED ORDER — POTASSIUM CHLORIDE 10 MEQ/50ML IV SOLN
10.0000 meq | INTRAVENOUS | Status: AC
  Administered 2017-11-16 (×2): 10 meq via INTRAVENOUS
  Filled 2017-11-16 (×2): qty 50

## 2017-11-16 MED ORDER — ONDANSETRON HCL 4 MG PO TABS: 4.0000 mg | ORAL_TABLET | Freq: Three times a day (TID) | ORAL | Status: DC

## 2017-11-16 MED ORDER — ONDANSETRON HCL 4 MG/2ML IJ SOLN: 4.0000 mg | Freq: Three times a day (TID) | INTRAMUSCULAR | Status: DC

## 2017-11-16 NOTE — Consult Note (Signed)
[facility] Face-to-Face Psychiatry Consult   Reason for Consult:  Severe depression  Referring Physician:  Dr. Orlie Dakin Patient Identification: LATRICA CLOWERS MRN:  [id number] Principal Diagnosis: MDD (major depressive disorder), recurrent severe, without psychosis ([place]) Diagnosis:   Patient Active Problem List   Diagnosis Date Noted  . Fever [R50.9] 11/15/2017  . Intractable nausea and vomiting [R11.2] 11/15/2017  . UTI (urinary tract infection) [N39.0] 10/25/2017  . Other fatigue [R53.83] 10/13/2017  . Deficiency anemia [D53.9] 10/13/2017  . Nausea with vomiting [R11.2] 09/22/2017  . Cervical cancer ([place]) [C53.9] 07/20/2017  . Anemia due to antineoplastic chemotherapy [D64.81, T45.1X5A] 07/19/2017  . Hypomagnesemia [E83.42] 06/28/2017  . Syncopal episodes [R55] 06/28/2017  . 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([place]) [E09.9, T38.0X5A] 04/26/2017  . Physical deconditioning [R53.81] 04/26/2017  . Night sweats [R61] 04/12/2017  . Port-A-Cath in place [facility] 04/04/2017  . Neuropathy associated with cancer ([place]) [C80.1, G63] 04/04/2017  . Goals of care, counseling/discussion [Z71.89] 04/04/2017  . Metastatic squamous cell carcinoma involving liver & sacrum with unknown primary site  [C78.7, C80.1] 03/30/2017  . Metastasis to liver of unknown origin ([place]) [C78.7, C80.1] 03/18/2017  . Cancer of unknown origin ([place]) [C80.1] 03/11/2017  . Metastasis to liver ([place]) [C78.7] 03/03/2017  . Sacral mass [R22.2] 03/02/2017  . Cancer associated pain [G89.3] 03/02/2017  . Mild single current episode of major depressive disorder ([place]) [F32.0] 08/23/2016  . Cerebral thrombosis with cerebral infarction [I63.30] 12/05/2015  . Stroke (cerebrum) ([place]) [I63.9] 12/05/2015  . Obstructive sleep apnea [G47.33] 07/24/2015  . Preoperative evaluation to rule out surgical contraindication [Z01.818] 10/04/2011  . Chest pain, unspecified [R07.9] 06/24/2010  . Shortness of breath [R06.02] 06/24/2010  . Essential hypertension  [I10] 10/20/2008  . RECTOCELE WITHOUT MENTION OF UTERINE PROLAPSE [N81.6] 10/20/2008  . ENDOMETRIAL POLYP [N84.0] 10/20/2008  . AF (paroxysmal atrial fibrillation) ([place]) [I48.0] 10/20/2008    Total Time spent with patient: 1 hour  Subjective:   ELINE GENG is a 52 y.o. female patient admitted with aggressive symptom management of metastatic cervical cancer to the liver.  HPI:   Per chart review, patient was admitted with aggressive symptom management of metastatic cervical cancer to the liver due to failed outpatient therapy. She has poor oral intake with nausea and vomiting. The treatment goal is palliative at this time. The patient is not willing to consider hospice yet. She has poorly controlled depression. She has been withdrawn from family. Home medications include Celexa 40 mg daily, Klonopin 0.5 mg daily, Xanax 0.25 mg TID PRN and Gabapentin 300 mg q am and 600 mg qhs. She is only prescribed PRN Xanax in the hospital and has not yet required it. Per progress note, she has improved appetite and feels better today.   On interview, Ms. Branagan reports reports that she has been depressed since June with symptoms of anhedonia, poor motivation, poor sleep, poor appetite (multiple nighttime awakenings) and weight loss of 40 pounds due to GI upset over the past 3 months.  She reports completing radiation in January and chemotherapy in May.  She has been nauseous for the past 12 weeks and nothing has helped including Zofran or Compazine.  She reports a history of depression after a stroke in 2017.  She recently reports poor medication compliance but has been consistently taking her medications for the past 2 weeks.  She denies recent medication changes.  Celexa was increased several months ago although it did not help to improve her mood.  She denies taking Remeron in the past.  She  denies SI, HI or AVH.  She denies a history of suicide attempts.  She denies a history of manic symptoms (decreased need for  sleep, increased energy, pressured speech or euphoria).  Past Psychiatric History: MDD  Risk to Self:  None. Denies SI.  Risk to Others:  None. Denies HI.  Prior Inpatient Therapy:  Denies  Prior Outpatient Therapy:  She is followed by Dr. [name].    Past Medical History:  Past Medical History:  Diagnosis Date  . Atrial fibrillation ([place])    a. echo 4/07: EF 60%  . Atrial tachycardia ([place])    a. s/p EPS 4/09: no inducible SVT - med Tx continued  . Cancer ([place])   . 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 Endometrial polyp   . Hyperlipidemia   . Hypertension   . PONV (postoperative nausea and vomiting)   . Rectocele    WITHOUT MENTION OF UTERINE PROLAPSE  . Stroke ([place])   . SVD (spontaneous vaginal delivery)    x 1    Past Surgical History:  Procedure Laterality Date  . COLONOSCOPY    . HYSTEROSCOPY    . IR FLUORO GUIDE PORT INSERTION RIGHT  03/23/2017  . IR US GUIDE VASC ACCESS RIGHT  03/23/2017  . LEEP    . Pylonidal cystect    . TUBAL LIGATION    . WISDOM TOOTH EXTRACTION     Family History:  Family History  Problem Relation Age of Onset  . Hypertension Father   . Diabetes Father   . Stroke Father   . Hypertension Mother   . Stroke Mother   . Cancer Mother        cervical ca  . Cancer Sister 82       uterine ca  . Cancer Maternal Grandmother        lung ca   Family Psychiatric  History: Daughter-anxiety  Social History:  Social History   Substance and Sexual Activity  Alcohol Use No     Social History   Substance and Sexual Activity  Drug Use No    Social History   Socioeconomic History  . Marital status: Married    Spouse name: Kasandra Knudsen  . Number of children: 1  . Years of education: Not on file  . Highest education level: Not on file  Occupational History  . Occupation: nurse  Social Needs  . Financial resource strain: Not on file  . Food insecurity:    Worry: Not on file    Inability: Not  on file  . Transportation needs:    Medical: Not on file    Non-medical: Not on file  Tobacco Use  . Smoking status: Former Smoker    Packs/day: 0.50    Years: 10.00    Pack years: 5.00    Types: Cigarettes    Last attempt to quit: 02/22/1998    Years since quitting: 19.7  . Smokeless tobacco: Never Used  Substance and Sexual Activity  . Alcohol use: No  . Drug use: No  . Sexual activity: Not on file  Lifestyle  . Physical activity:    Days per week: Not on file    Minutes per session: Not on file  . Stress: Not on file  Relationships  . Social connections:    Talks on phone: Not on file    Gets together: Not on file    Attends religious service: Not on file  Active member of club or organization: Not on file    Attends meetings of clubs or organizations: Not on file    Relationship status: Not on file  Other Topics Concern  . Not on file  Social History Narrative  . Not on file   Additional Social History: She lives at home with her husband an 27 y/o daughter. She has 3 stepsons. 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 She denies alcohol or illicit substance use.     Allergies:  No Known Allergies  Labs:  Results for orders placed or performed during the hospital encounter of 11/15/17 (from the past 48 hour(s))  Urinalysis, Complete w Microscopic     Status: Abnormal   Collection Time: 11/15/17  5:23 PM  Result Value Ref Range   Color, Urine YELLOW YELLOW   APPearance CLEAR CLEAR   Specific Gravity, Urine 1.005 1.005 - 1.030   pH 6.0 5.0 - 8.0   Glucose, UA NEGATIVE NEGATIVE mg/dL   Hgb urine dipstick NEGATIVE NEGATIVE   Bilirubin Urine NEGATIVE NEGATIVE   Ketones, ur 20 (A) NEGATIVE mg/dL   Protein, ur NEGATIVE NEGATIVE mg/dL   Nitrite NEGATIVE NEGATIVE   Leukocytes, UA NEGATIVE NEGATIVE   RBC / HPF 0-5 0 - 5 RBC/hpf   WBC, UA 0-5 0 - 5 WBC/hpf   Bacteria, UA RARE (A) NONE SEEN   Squamous Epithelial / LPF 0-5 0 - 5    Mucus PRESENT     Comment: Performed at [facility], [place] [address] [place], [place] [address]  Glucose, capillary     Status: Abnormal   Collection Time: 11/15/17  6:07 PM  Result Value Ref Range   Glucose-Capillary 116 (H) 70 - 99 mg/dL   Comment 1 Notify RN    Comment 2 Document in Chart   Basic metabolic panel     Status: Abnormal   Collection Time: 11/16/17  5:02 AM  Result Value Ref Range   Sodium 133 (L) 135 - 145 mmol/L   Potassium 3.3 (L) 3.5 - 5.1 mmol/L   Chloride 93 (L) 98 - 111 mmol/L   CO2 31 22 - 32 mmol/L   Glucose, Bld 130 (H) 70 - 99 mg/dL   BUN 6 6 - 20 mg/dL   Creatinine, Ser 0.52 0.44 - 1.00 mg/dL   Calcium 8.4 (L) 8.9 - 10.3 mg/dL   GFR calc non Af Amer >60 >60 mL/min   GFR calc Af Amer >60 >60 mL/min    Comment: (NOTE) The eGFR has been calculated using the CKD EPI equation. This calculation has not been validated in all clinical situations. eGFR's persistently <60 mL/min signify possible Chronic Kidney Disease.    Anion gap 9 5 - 15    Comment: Performed at [facility], [place] [address] [place], [place] [address]  Magnesium     Status: None   Collection Time: 11/16/17  5:02 AM  Result Value Ref Range   Magnesium 1.7 1.7 - 2.4 mg/dL    Comment: Performed at [facility], [place] [address], [place], [place] [address]  CBC with Differential/Platelet     Status: Abnormal   Collection Time: 11/16/17  5:02 AM  Result Value Ref Range   WBC 10.8 (H) 4.0 - 10.5 K/uL    Comment: WHITE COUNT CONFIRMED ON SMEAR   RBC 3.49 (L) 3.87 - 5.11 MIL/uL   Hemoglobin 9.1 (L) 12.0 - 15.0 g/dL  HCT 29.7 (L) 36.0 - 46.0 %   MCV 85.1 78.0 - 100.0 fL   MCH 26.1 26.0 - 34.0 pg   MCHC 30.6 30.0 - 36.0 g/dL   RDW 18.1 (H) 11.5 - 15.5 %   Platelets PLATELET CLUMPS NOTED ON SMEAR, UNABLE TO ESTIMATE 150 - 400 K/uL   Neutrophils Relative % 76 %   Neutro Abs 8.2 1.7 - 7.7 K/uL   Lymphocytes Relative 12 %   Lymphs Abs  1.3 0.7 - 4.0 K/uL   Monocytes Relative 12 %   Monocytes Absolute 1.2 0.1 - 1.0 K/uL   Eosinophils Relative 0 %   Eosinophils Absolute 0.0 0.0 - 0.7 K/uL   Basophils Relative 0 %   Basophils Absolute 0.0 0.0 - 0.1 K/uL   RBC Morphology POLYCHROMASIA PRESENT     Comment: Performed at [facility], [place] [address] [place], [place] [address]  Glucose, capillary     Status: Abnormal   Collection Time: 11/16/17  8:04 AM  Result Value Ref Range   Glucose-Capillary 138 (H) 70 - 99 mg/dL    Current Facility-Administered Medications  Medication Dose Route Frequency Provider Last Rate Last Dose  . acetaminophen (TYLENOL) tablet 650 mg  650 mg Oral Q4H PRN [name], MD   650 mg at 11/16/17 0459  . ALPRAZolam (XANAX) tablet 0.25 mg  0.25 mg Oral TID PRN Alvy Bimler, Ni, MD      . citalopram (CELEXA) tablet 40 mg  40 mg Oral Daily Alvy Bimler, Ni, MD   40 mg at 11/16/17 0940  . dextrose 5 % solution   Intravenous Continuous [name], MD 50 mL/hr at 11/16/17 0344    . dronabinol (MARINOL) capsule 5 mg  5 mg Oral BID AC Gorsuch, Ni, MD   5 mg at 11/15/17 1617  . flecainide (TAMBOCOR) tablet 50 mg  50 mg Oral Daily Alvy Bimler, Ni, MD   50 mg at 11/16/17 0940  . gabapentin (NEURONTIN) capsule 300 mg  300 mg Oral Daily Alvy Bimler, Ni, MD   300 mg at 11/16/17 0940  . gabapentin (NEURONTIN) capsule 600 mg  600 mg Oral QHS Alvy Bimler, Ni, MD   600 mg at 11/15/17 2122  . HYDROmorphone (DILAUDID) tablet 4 mg  4 mg Oral Q6H PRN Gorsuch, Ni, MD      . insulin aspart (novoLOG) injection 0-24 Units  0-24 Units Subcutaneous TID WC [name], MD   2 Units at 11/16/17 0826  . metoprolol tartrate (LOPRESSOR) tablet 50 mg  50 mg Oral BID Alvy Bimler, Ni, MD   50 mg at 11/16/17 0940  . ondansetron (ZOFRAN-ODT) disintegrating tablet 4-8 mg  4-8 mg Oral Q8H Gorsuch, Ni, MD       Or  . ondansetron (ZOFRAN) injection 4 mg  4 mg Intravenous Q8H Gorsuch, Ni, MD      . rivaroxaban (XARELTO) tablet 20 mg  20 mg Oral Q  supper Alvy Bimler, Ni, MD   20 mg at 11/15/17 1617  . senna-docusate (Senokot-S) tablet 1 tablet  1 tablet Oral QHS PRN [name], MD        Musculoskeletal: Strength & Muscle Tone: within normal limits Gait & Station: UTA since patient is sitting in a chair. Patient leans: N/A  Psychiatric Specialty Exam: Physical Exam  Nursing note and vitals reviewed. Constitutional: She is oriented to person, place, and time. She appears well-developed and well-nourished.  HENT:  Head: Normocephalic and atraumatic.  Neck: Normal range of motion.  Respiratory: Effort normal.  Musculoskeletal:  Normal range of motion.  Neurological: She is alert and oriented to person, place, and time.  Psychiatric: Her speech is normal and behavior is normal. Judgment and thought content normal. Cognition and memory are normal. She exhibits a depressed mood.    Review of Systems  Constitutional: Positive for fever. Negative for chills.  Cardiovascular: Negative for chest pain.  Gastrointestinal: Positive for diarrhea. Negative for abdominal pain, constipation, nausea and vomiting.  Psychiatric/Behavioral: Positive for depression. Negative for hallucinations, substance abuse and suicidal ideas. The patient has insomnia.   All other systems reviewed and are negative.   Blood pressure 135/86, pulse 82, temperature (!) 101.2 F (38.4 C), temperature source Oral, resp. rate 16, height _0  (1.727 m), weight 105 kg, last menstrual period 02/12/2012, SpO2 94 %.Body mass index is 35.2 kg/m.  General Appearance: Fairly Groomed, middle aged, Caucasian female, wearing a hospital gown with short, gray hair who is sitting in a chair. NAD.   Eye Contact:  Good  Speech:  Clear and Coherent and Normal Rate  Volume:  Normal  Mood:  Depressed  Affect:  Congruent  Thought Process:  Goal Directed, Linear and Descriptions of Associations: Intact  Orientation:  Full (Time, Place, and Person)  Thought Content:  Logical  Suicidal  Thoughts:  No  Homicidal Thoughts:  No  Memory:  Immediate;   Good Recent;   Good Remote;   Good  Judgement:  Fair  Insight:  Fair  Psychomotor Activity:  Normal  Concentration:  Concentration: Good and Attention Span: Good  Recall:  Good  Fund of Knowledge:  Good  Language:  Good  Akathisia:  No  Handed:  Right  AIMS (if indicated):   N/A  Assets:  Communication Skills Desire for Improvement Financial Resources/Insurance Housing Intimacy Social Support  ADL's:  Intact  Cognition:  WNL  Sleep:   Poor   Assessment:  ALEXIAH KOROMA is a 52 y.o. female who was admitted with aggressive symptom management of metastatic cervical cancer to the liver due to failed outpatient therapy. She reports depressive symptoms since June with poor sleep, poor appetite, amotivation, anhedonia and weight loss. Recommend adding Remeron for mood augmentation, insomnia and poor appetite/nausea. She denies SI, HI or AVH and does not warrant inpatient psychiatric hospitalization at this time.   Treatment Plan Summary: -Start Remeron 7.5 mg qhs for mood augmentation, insomnia and poor appetite/nausea. May increase as needed for symptom management. Alternatively could consider Zyprexa 2.5 mg qhs if Remeron is ineffective.  -Continue Celexa 40 mg daily for mood. -Continue Gabapentin 300 mg q am and 600 mg qhs for anxiety and chronic pain. -Continue Klonopin 0.5 mg qhs for insomnia/anxiety. Patient reports that she is not taking Xanax.  -Would obtain EKG since patient is receiving medications that can cause QTc prolongation.  -Psychiatry will sign off on patient at this time. Please consult psychiatry again as needed.   Disposition: No evidence of imminent risk to self or others at present.   Patient does not meet criteria for psychiatric inpatient admission.  Faythe Dingwall, DO 11/16/2017 10:15 AM

## 2017-11-16 NOTE — Progress Notes (Signed)
Cindy Faulkner   DOB:10-31-1965   YQ#:[id number]    Assessment & Plan:   Metastatic cervical cancer to the liver She tolerated treatment well but has numerous symptoms which I believe is unrelated to her treatment.  Continue aggressive supportive care. Next dose of treatment is due on November 21, 2017.  Hypomagnesemia She continues to have persistent hypomagnesemia despite oral magnesium replacement therapy I will order IV magnesium infusion on 11/15/2017.  Magnesium level is now normal.  Will observe again tomorrow.  Fever of unknown etiology Blood culture and urine culture is pending.  Chest x-ray is unremarkable for infection.  She has leukocytosis.  I recommend close observation only.  Intractable nausea with vomiting She continues to have uncontrolled nausea and vomiting despite minimal oral intake and not being on chemotherapy I recommend we continue on Marinol.  Recent MRI of the brainhasexcludedintracranial metastasis I will schedule IV fluids along with IV antiemetics.  Due to her dislike for normal saline, I will change it to 5% dextrose. She is doing better.  I will reduce schedule IV Zofran to 4 mg 3 times a day  Diabetes mellitus I will monitor her blood sugar carefully while she is on IV fluids.  If her blood sugar starts to trend high, I will change her dextrose to D5W or half normal saline I will check her blood sugar regularly and put her on insulin sliding scale So far, her blood sugar is satisfactory  Significant, poorly controlled major depressive disorder ([place]ulk[place]) She has significant depression and seems to be withdrawn from family members I had a lot of discussion with the patient and family and continue to encourage her to participate in family activities. She denies suicidal ideation I have consulted inpatient psychiatrist to come and evaluate the patient along with recommendation about treatment options.  Consult is pending  Recent fall, significant  debility I will consult physical therapy for evaluation and treatment while hospitalized  Poor oral intake The patient had tried Remeron in the past.  I am concerned about putting her on Megace due to risk of DVT.  Will consult dietitian.  She appears to have an appetite since admission  History of stroke She will continue Xarelto  Goals of care, counseling/discussion We had extensive goals of care discussion She understood treatment goal is palliative The patient is not willing to consider hospice yet. She has failed outpatient therapy and needs to be admitted for aggressive evaluation.  If her symptoms remain unchanged despite aggressive therapy, I plan to repeat CT imaging to exclude cancer progression  Discharge planning Overall, she is slowly improving.  I am hopeful she can be discharged before the end of the week.  Cindy Lark, MD 11/16/2017  7:50 AM   Subjective:  She felt better.  She is thought to have some appetite last night and attempted to eat some applesauce.  She has ordered breakfast today.  She has no further nausea vomiting. Her mood is stable.  Objective:  Vitals:   11/15/17 1925 11/16/17 0422  BP: 135/74 135/86  Pulse: 69 82  Resp: 18 16  Temp: 97.7 F (36.5 C) (!) 101.2 F (38.4 C)  SpO2: 91% 94%     Intake/Output Summary (Last 24 hours) at 11/16/2017 0750 Last data filed at 11/16/2017 0344 Gross per 24 hour  Intake 935.11 ml  Output -  Net 935.11 ml    GENERAL:alert, no distress and comfortable SKIN: skin color, texture, turgor are normal, no rashes or significant lesions EYES: normal,  Conjunctiva are pink and non-injected, sclera clear OROPHARYNX:no exudate, no erythema and lips, buccal mucosa, and tongue normal  NECK: supple, thyroid normal size, non-tender, without nodularity LYMPH:  no palpable lymphadenopathy in the cervical, axillary or inguinal LUNGS: clear to auscultation and percussion with normal breathing effort HEART: regular  rate & rhythm and no murmurs and no lower extremity edema ABDOMEN:abdomen soft, non-tender and normal bowel sounds Musculoskeletal:no cyanosis of digits and no clubbing  NEURO: alert & oriented x 3 with fluent speech, no focal motor/sensory deficits   Labs:  Lab Results  Component Value Date   WBC 10.8 (H) 11/16/2017   HGB 9.1 (L) 11/16/2017   HCT 29.7 (L) 11/16/2017   MCV 85.1 11/16/2017   PLT PLATELET CLUMPS NOTED ON SMEAR, UNABLE TO ESTIMATE 11/16/2017   NEUTROABS 8.2 11/16/2017    Lab Results  Component Value Date   NA 133 (L) 11/16/2017   K 3.3 (L) 11/16/2017   CL 93 (L) 11/16/2017   CO2 31 11/16/2017    Studies:  Dg Chest 2 View  Result Date: 11/15/2017 CLINICAL DATA:  Fever.  Cough and congestion. EXAM: CHEST - 2 VIEW COMPARISON:  06/16/2017. FINDINGS: PowerPort catheter with tip over superior vena cava. Heart size normal. Mild bibasilar atelectasis and/or scarring. No pleural effusion or pneumothorax. Degenerative change thoracic spine. IMPRESSION: 1.  PowerPort catheter with tip over superior vena cava. 2.  Mild bibasilar atelectasis and or scarring. Electronically Signed   By: Marcello Moores  Register   On: 11/15/2017 12:05

## 2017-11-16 NOTE — Progress Notes (Signed)
Initial Nutrition Assessment  DOCUMENTATION CODES:   Non-severe (moderate) malnutrition in context of chronic illness, Obesity unspecified  INTERVENTION:   Provide Premier Protein BID, each supplement provides 160 kcal and 30 grams of protein.   NUTRITION DIAGNOSIS:   Moderate Malnutrition related to chronic illness, cancer and cancer related treatments as evidenced by percent weight loss, energy intake < or equal to 75% for > or equal to 1 month.  GOAL:   Patient will meet greater than or equal to 90% of their needs  MONITOR:   PO intake, Supplement acceptance, Weight trends, Labs, I & O's  REASON FOR ASSESSMENT:   Consult Assessment of nutrition requirement/status  ASSESSMENT:   52 y.o. female is admitted for aggressive symptom management due to overall decline in performance status.   Patient in room with daughter at bedside. Pt reports having nausea for 5 weeks now. States she was unable to eat more than a fistful of food before getting sick. Pt states she was prescribed Marinol at one point and this helped for a few days then the nausea started again. Pt is now on Marinol again and scheduled Zofran, pt states it is helping but she still feels some nausea.  Pt denies sensitivity to food smells, but states the thought of some food lessen her appetite. Denies issues with swallowing or chewing or taste changes. Does report having a salty taste in her mouth when receiving NaCl.  Pt states she has eaten a little better this admission. Had some soup and a sandwich last night. Ate some jello during visit. Pt requests Premier Protein as her supplement of choice. RD agreed to order patient anything she thinks she can tolerate.   Per weight records, pt has lost 32 lb since 6/18 (12% wt loss x 3 months, significant for time frame).   Medications: Marinol capsule BID, Zofran, D5 solution Labs reviewed: CBGs: 116-138 Low Na, K Mg WNL   NUTRITION - FOCUSED PHYSICAL  EXAM:  Nutrition focused physical exam shows no sign of depletion of muscle mass or body fat.  Diet Order:   Diet Order            Diet regular Room service appropriate? Yes; Fluid consistency: Thin  Diet effective now              EDUCATION NEEDS:   Education needs have been addressed  Skin:  Skin Assessment: Reviewed RN Assessment  Last BM:  9/24  Height:   Ht Readings from Last 1 Encounters:  11/15/17 5\' 8"  (1.727 m)    Weight:   Wt Readings from Last 1 Encounters:  11/15/17 105 kg    Ideal Body Weight:  63.6 kg  BMI:  Body mass index is 35.2 kg/m.  Estimated Nutritional Needs:   Kcal:  2200-2400  Protein:  95-105g  Fluid:  2.4L/day  Clayton Bibles, MS, RD, LDN [facility] Dietitian Pager: [phone] After Hours Pager: [phone]

## 2017-11-16 NOTE — Progress Notes (Signed)
[facility]  Mrs. Krzywicki is an active patient with [facility] prior to this hospitalization.  [facility] has been providing [place] and [place] services for home IV hydration. [facility] Infusion Coordinator/Hospital Team will follow pt while here to support DC plans as needed.  If patient discharges after hours, please call [phone].   LAKELYN STRAUS 11/16/2017, 10:38 AM

## 2017-11-16 NOTE — Evaluation (Signed)
Physical Therapy Evaluation Patient Details Name: Cindy Faulkner MRN: [id number] DOB: 03/18/1965 Today's Date: 11/16/2017   History of Present Illness  52 yo female adm with intracable nausea and vomiting, pt with ongoing treatment for cervical CA with mets to liver; PMH: CVA L internal capsule  Clinical Impression  Pt admitted with above diagnosis. Pt currently with functional limitations due to the deficits listed below (see PT Problem List).  Pt amb ~ 100' with min to min/guard and HHA or IV pole, LOB x2 and difficulty negotiating obstacles on the R side, more unsteady with turns, mild scissoring present; will benefit from continued PT in acute setting as well as f/u OPPT vs HHPT; may benefit from use of cane vs RW--TBD  Pt will benefit from skilled PT to increase their independence and safety with mobility to allow discharge to the venue listed below.       Follow Up Recommendations Home health PT(vs OPPT if has transportation) /24hr supervision for safety    Equipment Recommendations  Other (comment)(TBD, cane vs RW)    Recommendations for Other Services       Precautions / Restrictions Precautions Precautions: Fall Precaution Comments: recent assisted fall at home while amb with nurse and her dtr      Mobility  Bed Mobility Overal bed mobility: Needs Assistance Bed Mobility: Supine to Sit;Sit to Supine     Supine to sit: Supervision Sit to supine: Supervision   General bed mobility comments: for safety  Transfers Overall transfer level: Needs assistance Equipment used: None Transfers: Sit to/from Stand Sit to Stand: Min guard         General transfer comment: to steady upon rising, cues to control descent  Ambulation/Gait Ambulation/Gait assistance: Min assist;Min guard Gait Distance (Feet): 100 Feet Assistive device: 1 person hand held assist;IV Pole Gait Pattern/deviations: Decreased stride length;Scissoring;Step-through pattern     General Gait  Details: unsteady gait, min to min/guard assist especially with turns  Stairs            Wheelchair Mobility    Modified Rankin (Stroke Patients Only)       Balance Overall balance assessment: Needs assistance;History of Falls Sitting-balance support: No upper extremity supported;Feet supported;Bilateral upper extremity supported Sitting balance-Leahy Scale: Good       Standing balance-Leahy Scale: Fair                               Pertinent Vitals/Pain Pain Assessment: No/denies pain    Home Living Family/patient expects to be discharged to:: Private residence Living Arrangements: Spouse/significant other Available Help at Discharge: Family Type of Home: House Home Access: Stairs to enter Entrance Stairs-Rails: Right Entrance Stairs-Number of Steps: 6 Home Layout: Two level;Able to live on main level with bedroom/bathroom        Prior Function Level of Independence: Needs assistance   Gait / Transfers Assistance Needed: recent close supervision to HHA with household distance amb     Comments: worked as Warden/ranger at [employer]        Extremity/Trunk Assessment   Upper Extremity Assessment Upper Extremity Assessment: Overall WFL for tasks assessed    Lower Extremity Assessment Lower Extremity Assessment: Generalized weakness       Communication   Communication: No difficulties  Cognition Arousal/Alertness: Awake/alert Behavior During Therapy: Flat affect Overall Cognitive Status: Within Functional Limits for tasks assessed  General Comments      Exercises     Assessment/Plan    PT Assessment Patient needs continued PT services  PT Problem List Decreased strength;Decreased activity tolerance;Decreased balance;Decreased knowledge of use of DME;Decreased mobility       PT Treatment Interventions DME instruction;Gait training;Therapeutic  activities;Therapeutic exercise;Patient/family education;Balance training    PT Goals (Current goals can be found in the Care Plan section)  Acute Rehab PT Goals Patient Stated Goal: to get stronger PT Goal Formulation: With patient Time For Goal Achievement: 11/30/17 Potential to Achieve Goals: Good    Frequency Min 3X/week   Barriers to discharge        Co-evaluation               AM-PAC PT "6 Clicks" Daily Activity  Outcome Measure Difficulty turning over in bed (including adjusting bedclothes, sheets and blankets)?: A Little Difficulty moving from lying on back to sitting on the side of the bed? : A Little Difficulty sitting down on and standing up from a chair with arms (e.g., wheelchair, bedside commode, etc,.)?: A Little Help needed moving to and from a bed to chair (including a wheelchair)?: A Little Help needed walking in hospital room?: A Little Help needed climbing 3-5 steps with a railing? : A Little 6 Click Score: 18    End of Session   Activity Tolerance: Patient tolerated treatment well Patient left: with call bell/phone within reach;in chair;with nursing/sitter in room   PT Visit Diagnosis: Difficulty in walking, not elsewhere classified (R26.2);Unsteadiness on feet (R26.81)    Time: 8648-4720 PT Time Calculation (min) (ACUTE ONLY): 16 min   Charges:   PT Evaluation $PT Eval Low Complexity: 1 Low          Kenyon Ana, PT Pager: [phone] 11/16/2017   [facility] [phone]   [facility] 11/16/2017, 12:04 PM

## 2017-11-17 DIAGNOSIS — E440 Moderate protein-calorie malnutrition: Secondary | ICD-10-CM

## 2017-11-17 LAB — CBC WITH DIFFERENTIAL/PLATELET
BASOS PCT: 0 %
Basophils Absolute: 0 10*3/uL (ref 0.0–0.1)
Eosinophils Absolute: 0 10*3/uL (ref 0.0–0.7)
Eosinophils Relative: 0 %
HCT: 31.6 % — ABNORMAL LOW (ref 36.0–46.0)
HEMOGLOBIN: 9.8 g/dL — AB (ref 12.0–15.0)
LYMPHS ABS: 2.7 10*3/uL (ref 0.7–4.0)
Lymphocytes Relative: 17 %
MCH: 26.4 pg (ref 26.0–34.0)
MCHC: 31 g/dL (ref 30.0–36.0)
MCV: 85.2 fL (ref 78.0–100.0)
MONOS PCT: 11 %
Monocytes Absolute: 1.7 10*3/uL (ref 0.1–1.0)
NEUTROS PCT: 72 %
Neutro Abs: 11.6 10*3/uL (ref 1.7–7.7)
Platelets: UNDETERMINED 10*3/uL (ref 150–400)
RBC: 3.71 MIL/uL — AB (ref 3.87–5.11)
RDW: 18.2 % — ABNORMAL HIGH (ref 11.5–15.5)
WBC: 16 10*3/uL — AB (ref 4.0–10.5)

## 2017-11-17 LAB — BASIC METABOLIC PANEL
ANION GAP: 9 (ref 5–15)
BUN: 5 mg/dL — ABNORMAL LOW (ref 6–20)
CALCIUM: 8.7 mg/dL — AB (ref 8.9–10.3)
CO2: 31 mmol/L (ref 22–32)
CREATININE: 0.62 mg/dL (ref 0.44–1.00)
Chloride: 91 mmol/L — ABNORMAL LOW (ref 98–111)
GFR calc Af Amer: >60 mL/min (ref >60)
GLUCOSE: 117 mg/dL — AB (ref 70–99)
POTASSIUM: 3.8 mmol/L (ref 3.5–5.1)
SODIUM: 131 mmol/L — AB (ref 135–145)

## 2017-11-17 LAB — MAGNESIUM: Magnesium: 1.6 mg/dL — ABNORMAL LOW (ref 1.7–2.4)

## 2017-11-17 LAB — GLUCOSE, CAPILLARY
Glucose-Capillary: 117 mg/dL — ABNORMAL HIGH (ref 70–99)
Glucose-Capillary: 200 mg/dL — ABNORMAL HIGH (ref 70–99)
Glucose-Capillary: 120 mg/dL — ABNORMAL HIGH (ref 70–99)

## 2017-11-17 MED ORDER — ONDANSETRON HCL 4 MG/2ML IJ SOLN
4.0000 mg | Freq: Every day | INTRAMUSCULAR | Status: DC
  Administered 2017-11-17 – 2017-11-18 (×2): 4 mg via INTRAVENOUS
  Filled 2017-11-17 (×2): qty 2

## 2017-11-17 MED ORDER — MAGNESIUM OXIDE 400 (241.3 MG) MG PO TABS
400.0000 mg | ORAL_TABLET | Freq: Two times a day (BID) | ORAL | Status: DC
  Administered 2017-11-17 – 2017-11-18 (×3): 400 mg via ORAL
  Filled 2017-11-17 (×3): qty 1

## 2017-11-17 MED ORDER — MAGNESIUM SULFATE 4 GM/100ML IV SOLN
4.0000 g | Freq: Once | INTRAVENOUS | Status: AC
  Administered 2017-11-17: 4 g via INTRAVENOUS
  Filled 2017-11-17: qty 100

## 2017-11-17 NOTE — Progress Notes (Signed)
Cindy Faulkner   DOB:1965-08-28   WV#:[id number]    Assessment & Plan:  Metastatic cervical cancer to the liver She tolerated treatment well but has numerous symptoms which I believe is unrelated to her treatment. Continue aggressive supportive care. Next dose of treatment is due on November 21, 2017.  Hypomagnesemia She continues to have persistent hypomagnesemia despite oral magnesium replacement therapy Her magnesium level came back low again.  I will order another dose of IV magnesium especially with findings of QTC prolongation.  I will also order schedule oral magnesium twice a day. I will consult advanced home care to administer daily IV magnesium to prevent hypomagnesemia  Fever of unknown etiology Blood culture and urine culture are negative. Chest x-ray is unremarkable for infection. She has leukocytosis. I recommend close observation only. She has been afebrile over the last 24 hours  Intractable nausea with vomiting Overall improving.  I will reduce IV Zofran to just once a day.  Continue IV fluids with D5W. I will change home IV fluids to D5W since she tolerated that better  Diabetes mellitus So far, her blood sugar is satisfactory  Significant, poorly controlledmajor depressive disorder ([place]) She has significant depression and seems to be withdrawn from family members I had a lot of discussion with the patient and family and continue to encourage her to participate in family activities. She denies suicidal ideation Appreciate consultation from psychiatrist  QTC prolongation We will replace magnesium Recheck tomorrow  Recent fall, significant debility I appreciate PT evaluation.  Will consult home PT upon discharge  Poor oral intake She appears to have an appetite since admission Resume low-dose Remeron  History of stroke She will continue Xarelto  Goals of care, counseling/discussion We had extensive goals of care discussion She understood  treatment goal is palliative The patient is not willing to consider hospice yet. Overall improving.  Discharge planning Overall, she is slowly improving.  I am hopeful she can be discharged tomorrow. I have placed another face-to-face encounter for home health care upon discharge  [name], MD 11/17/2017  7:49 AM   Subjective:  She felt better today.  She ate some food without nausea or vomiting.  Denies recent constipation.  She was seen by psychiatrist who recommended continued treatment with additional Remeron.  EKG was ordered which showed mild QTC prolongation.  Her magnesium level was noted to be low.  She went for physical therapy evaluation and was noted to have difficulties with mobility.  Objective:  Vitals:   11/16/17 2134 11/17/17 0643  BP: (!) 143/89 136/76  Pulse: 84 80  Resp: 14 15  Temp: 99.2 F (37.3 C) 98.7 F (37.1 C)  SpO2: 97% 98%     Intake/Output Summary (Last 24 hours) at 11/17/2017 0749 Last data filed at 11/16/2017 1854 Gross per 24 hour  Intake 120 ml  Output -  Net 120 ml    GENERAL:alert, no distress and comfortable SKIN: skin color, texture, turgor are normal, no rashes or significant lesions EYES: normal, Conjunctiva are pink and non-injected, sclera clear OROPHARYNX:no exudate, no erythema and lips, buccal mucosa, and tongue normal  NECK: supple, thyroid normal size, non-tender, without nodularity LYMPH:  no palpable lymphadenopathy in the cervical, axillary or inguinal LUNGS: clear to auscultation and percussion with normal breathing effort HEART: regular rate & rhythm and no murmurs and no lower extremity edema ABDOMEN:abdomen soft, non-tender and normal bowel sounds Musculoskeletal:no cyanosis of digits and no clubbing  NEURO: alert & oriented x 3 with fluent  speech, no focal motor/sensory deficits   Labs:  Lab Results  Component Value Date   WBC 16.0 (H) 11/17/2017   HGB 9.8 (L) 11/17/2017   HCT 31.6 (L) 11/17/2017   MCV 85.2  11/17/2017   PLT PLATELET CLUMPS NOTED ON SMEAR, UNABLE TO ESTIMATE 11/17/2017   NEUTROABS 11.6 11/17/2017    Lab Results  Component Value Date   NA 131 (L) 11/17/2017   K 3.8 11/17/2017   CL 91 (L) 11/17/2017   CO2 31 11/17/2017    Studies:  Dg Chest 2 View  Result Date: 11/15/2017 CLINICAL DATA:  Fever.  Cough and congestion. EXAM: CHEST - 2 VIEW COMPARISON:  06/16/2017. FINDINGS: PowerPort catheter with tip over superior vena cava. Heart size normal. Mild bibasilar atelectasis and/or scarring. No pleural effusion or pneumothorax. Degenerative change thoracic spine. IMPRESSION: 1.  PowerPort catheter with tip over superior vena cava. 2.  Mild bibasilar atelectasis and or scarring. Electronically Signed   By: Marcello Moores  Register   On: 11/15/2017 12:05

## 2017-11-17 NOTE — Progress Notes (Signed)
Physical Therapy Treatment Patient Details Name: Cindy Faulkner MRN: [id number] DOB: February 18, 1966 Today's Date: 11/17/2017    History of Present Illness 52 yo female adm with intracable nausea and vomiting, pt with ongoing treatment for cervical CA with mets to liver; PMH: CVA L internal capsule    PT Comments    Pt progressing, incr tolerance to activity today although she is more lethargic today; continue to recommend f/u OPPT vs HHPT depending on availability of transportation; will continue to follow in acute setting  Follow Up Recommendations  Home health PT(vs OPPT if has transportation), supervision for mobility     Equipment Recommendations  None recommended by PT(has cane and RW)    Recommendations for Other Services       Precautions / Restrictions Precautions Precautions: Fall Precaution Comments: recent assisted fall at home while amb with nurse and her dtr    Mobility  Bed Mobility Overal bed mobility: Needs Assistance Bed Mobility: Supine to Sit;Sit to Supine     Supine to sit: Supervision Sit to supine: Supervision   General bed mobility comments: for safety  Transfers Overall transfer level: Needs assistance Equipment used: None Transfers: Sit to/from Stand Sit to Stand: Min guard         General transfer comment: to steady upon rising, cues to control descent  Ambulation/Gait Ambulation/Gait assistance: Min assist;Min guard Gait Distance (Feet): 110 Feet Assistive device: 1 person hand held assist;IV Pole;None Gait Pattern/deviations: Step-through pattern;Decreased stride length Gait velocity: decr   General Gait Details: guarded gait with minimal rotation/arm swing; pt able to amb with min/guard assist and no AD today, slight LOB x1 during turn, pt able to recover although reactions delayed   Stairs             Wheelchair Mobility    Modified Rankin (Stroke Patients Only)       Balance     Sitting balance-Leahy Scale: Good        Standing balance-Leahy Scale: Fair                              Cognition Arousal/Alertness: Lethargic Behavior During Therapy: WFL for tasks assessed/performed Overall Cognitive Status: Within Functional Limits for tasks assessed                                 General Comments: affect seems improved today however pt is more letharegic, arouses with multi-modal stimuli      Exercises General Exercises - Lower Extremity Ankle Circles/Pumps: AROM;Both;10 reps Long Arc Quad: AROM;Both;10 reps;Seated;Limitations Long [employer] Limitations: requires light assist/tactile cues to move through full ROM Heel Slides: AROM;Both;10 reps;Supine Hip Flexion/Marching: AROM;Both;10 reps;Standing;Limitations Hip Flexion/Marching Limitations: standing toe taps bil LEs, pt requires bil UE support to perform safely Toe Raises: AROM;Both;10 reps;Seated Heel Raises: AROM;Both;10 reps;Seated Mini-Sqauts: Strengthening;5 reps    General Comments        Pertinent Vitals/Pain Pain Assessment: No/denies pain    Home Living                      Prior Function            PT Goals (current goals can now be found in the care plan section) Acute Rehab PT Goals Patient Stated Goal: to get stronger PT Goal Formulation: With patient Time For Goal Achievement: 11/30/17 Potential to Achieve Goals: Good Progress  towards PT goals: Progressing toward goals    Frequency    Min 3X/week      PT Plan Current plan remains appropriate    Co-evaluation              AM-PAC PT "6 Clicks" Daily Activity  Outcome Measure  Difficulty turning over in bed (including adjusting bedclothes, sheets and blankets)?: None Difficulty moving from lying on back to sitting on the side of the bed? : A Little Difficulty sitting down on and standing up from a chair with arms (e.g., wheelchair, bedside commode, etc,.)?: A Little Help needed moving to and from a bed to chair  (including a wheelchair)?: A Little Help needed walking in hospital room?: A Little Help needed climbing 3-5 steps with a railing? : A Little 6 Click Score: 19    End of Session Equipment Utilized During Treatment: Gait belt Activity Tolerance: Patient tolerated treatment well Patient left: in bed;with call bell/phone within reach;with bed alarm set   PT Visit Diagnosis: Difficulty in walking, not elsewhere classified (R26.2);Unsteadiness on feet (R26.81)     Time: 8756-4332 PT Time Calculation (min) (ACUTE ONLY): 22 min  Charges:  $Therapeutic Exercise: 8-22 mins                     Kenyon Ana, PT Pager: [phone] 11/17/2017   [facility] [phone]    [facility] 11/17/2017, 11:51 AM

## 2017-11-18 DIAGNOSIS — C539 Malignant neoplasm of cervix uteri, unspecified: Secondary | ICD-10-CM | POA: Diagnosis not present

## 2017-11-18 DIAGNOSIS — C801 Malignant (primary) neoplasm, unspecified: Secondary | ICD-10-CM | POA: Diagnosis not present

## 2017-11-18 DIAGNOSIS — E440 Moderate protein-calorie malnutrition: Secondary | ICD-10-CM | POA: Diagnosis not present

## 2017-11-18 DIAGNOSIS — R112 Nausea with vomiting, unspecified: Secondary | ICD-10-CM | POA: Diagnosis not present

## 2017-11-18 DIAGNOSIS — C787 Secondary malignant neoplasm of liver and intrahepatic bile duct: Secondary | ICD-10-CM | POA: Diagnosis not present

## 2017-11-18 DIAGNOSIS — G4733 Obstructive sleep apnea (adult) (pediatric): Secondary | ICD-10-CM | POA: Diagnosis not present

## 2017-11-18 LAB — GLUCOSE, CAPILLARY
Glucose-Capillary: 141 mg/dL — ABNORMAL HIGH (ref 70–99)
Glucose-Capillary: 204 mg/dL — ABNORMAL HIGH (ref 70–99)

## 2017-11-18 LAB — CBC WITH DIFFERENTIAL/PLATELET
Basophils Absolute: 0 10*3/uL (ref 0.0–0.1)
Basophils Relative: 0 %
EOS ABS: 0 10*3/uL (ref 0.0–0.7)
Eosinophils Relative: 0 %
HCT: 30.4 % — ABNORMAL LOW (ref 36.0–46.0)
Hemoglobin: 9.3 g/dL — ABNORMAL LOW (ref 12.0–15.0)
LYMPHS PCT: 20 %
Lymphs Abs: 2.2 10*3/uL (ref 0.7–4.0)
MCH: 26.3 pg (ref 26.0–34.0)
MCHC: 30.6 g/dL (ref 30.0–36.0)
MCV: 86.1 fL (ref 78.0–100.0)
MONOS PCT: 12 %
Monocytes Absolute: 1.4 10*3/uL (ref 0.1–1.0)
Neutro Abs: 7.7 10*3/uL (ref 1.7–7.7)
Neutrophils Relative %: 68 %
Platelets: UNDETERMINED 10*3/uL (ref 150–400)
RBC: 3.53 MIL/uL — ABNORMAL LOW (ref 3.87–5.11)
RDW: 18.5 % — ABNORMAL HIGH (ref 11.5–15.5)
WBC: 11.3 10*3/uL — ABNORMAL HIGH (ref 4.0–10.5)

## 2017-11-18 LAB — COMPREHENSIVE METABOLIC PANEL
ALT: 23 U/L (ref 0–44)
AST: 43 U/L — ABNORMAL HIGH (ref 15–41)
Albumin: 2.2 g/dL — ABNORMAL LOW (ref 3.5–5.0)
Alkaline Phosphatase: 114 U/L (ref 38–126)
Anion gap: 10 (ref 5–15)
BUN: 7 mg/dL (ref 6–20)
CO2: 27 mmol/L (ref 22–32)
CREATININE: 0.54 mg/dL (ref 0.44–1.00)
Calcium: 7.9 mg/dL — ABNORMAL LOW (ref 8.9–10.3)
Chloride: 94 mmol/L — ABNORMAL LOW (ref 98–111)
GFR calc non Af Amer: >60 mL/min (ref >60)
Glucose, Bld: 118 mg/dL — ABNORMAL HIGH (ref 70–99)
Potassium: 3.6 mmol/L (ref 3.5–5.1)
Sodium: 131 mmol/L — ABNORMAL LOW (ref 135–145)
Total Bilirubin: 0.7 mg/dL (ref 0.3–1.2)
Total Protein: 6.3 g/dL — ABNORMAL LOW (ref 6.5–8.1)

## 2017-11-18 LAB — MAGNESIUM: Magnesium: 1.7 mg/dL (ref 1.7–2.4)

## 2017-11-18 MED ORDER — HEPARIN SOD (PORK) LOCK FLUSH 100 UNIT/ML IV SOLN
500.0000 [IU] | INTRAVENOUS | Status: DC | PRN
  Filled 2017-11-18: qty 5

## 2017-11-18 MED ORDER — MIRTAZAPINE 15 MG PO TBDP: 7.5000 mg | ORAL_TABLET | Freq: Every day | ORAL | 9 refills | Status: DC

## 2017-11-18 MED ORDER — PREMIER PROTEIN SHAKE: 11.0000 [oz_av] | Freq: Two times a day (BID) | ORAL | 9 refills | Status: DC

## 2017-11-18 MED ORDER — HEPARIN SOD (PORK) LOCK FLUSH 100 UNIT/ML IV SOLN: 500.0000 [IU] | INTRAVENOUS | Status: DC

## 2017-11-18 MED ORDER — MAGNESIUM OXIDE 400 (241.3 MG) MG PO TABS: 400.0000 mg | ORAL_TABLET | Freq: Two times a day (BID) | ORAL | 11 refills | Status: DC

## 2017-11-18 MED ORDER — CLONAZEPAM 0.5 MG PO TABS: 0.5000 mg | ORAL_TABLET | Freq: Every evening | ORAL | Status: DC | PRN

## 2017-11-18 MED ORDER — CLONAZEPAM 0.5 MG PO TABS: 0.5000 mg | ORAL_TABLET | Freq: Every evening | ORAL | 0 refills | Status: DC | PRN

## 2017-11-18 MED FILL — clonazePAM 0.5 MG TABS: 0.5 | 30 days supply | Qty: 30 | Fill #0

## 2017-11-18 MED FILL — MIRTAZAPINE 15 MG ODT: 15 | 60 days supply | Qty: 30 | Fill #0

## 2017-11-18 NOTE — Progress Notes (Signed)
Pt discharged home with spouse in stable condition.  Discharge instructions given. Scripts sent to pharmacy of pt choice. No immediate questions or concerns at this time. Pt discharged from unit via wheelchair.

## 2017-11-18 NOTE — Discharge Summary (Signed)
Physician Discharge Summary  Patient ID: Cindy Faulkner MRN: [id number] DOB/AGE: Sep 15, 1965 52 y.o.  Admit date: 11/15/2017 Discharge date: 11/18/2017  Primary Care Physician:  Lennie Odor, PA-C   Discharge Diagnoses:    Present on Admission: . Intractable nausea and vomiting   Discharge Medications:  Allergies as of 11/18/2017   No Known Allergies     Medication List    STOP taking these medications   ALPRAZolam 0.25 MG tablet Commonly known as:  XANAX   lisinopril 20 MG tablet Commonly known as:  PRINIVIL,ZESTRIL   scopolamine 1 MG/3DAYS Commonly known as:  TRANSDERM-SCOP   valACYclovir 1000 MG tablet Commonly known as:  VALTREX     TAKE these medications   atorvastatin 40 MG tablet Commonly known as:  LIPITOR Take 1 tablet (40 mg total) by mouth daily at 6 PM.   cholecalciferol 1000 units tablet Commonly known as:  VITAMIN D Take 2,000 Units by mouth daily.   citalopram 40 MG tablet Commonly known as:  CELEXA Take 40 mg by mouth daily.   clonazePAM 0.5 MG tablet Commonly known as:  KLONOPIN Take 1 tablet (0.5 mg total) by mouth at bedtime as needed (sedation). What changed:    when to take this  reasons to take this   dronabinol 5 MG capsule Commonly known as:  MARINOL Take 1 capsule (5 mg total) by mouth 2 (two) times daily before lunch and supper.   flecainide 50 MG tablet Commonly known as:  TAMBOCOR Take 1 tablet (50 mg total) by mouth daily.   gabapentin 300 MG capsule Commonly known as:  NEURONTIN Take 1-2 capsules (300-600 mg total) by mouth 2 (two) times daily. 300 mg in the am and 600 mg at night   HYDROmorphone 4 MG tablet Commonly known as:  DILAUDID Take 1 tablet (4 mg total) by mouth every 6 (six) hours as needed for severe pain.   lidocaine-prilocaine cream Commonly known as:  EMLA APPLY 1 APPLICATION TO AFFECTED AREA(S) TOPICALLY AS NEEDED What changed:  See the new instructions.   magnesium oxide 400 (241.3  Mg) MG tablet Commonly known as:  MAG-OX Take 1 tablet (400 mg total) by mouth 2 (two) times daily. What changed:  when to take this   metFORMIN 500 MG tablet Commonly known as:  GLUCOPHAGE Take 1 tablet (500 mg total) by mouth 2 (two) times daily.   metoprolol tartrate 50 MG tablet Commonly known as:  LOPRESSOR Take 1 tablet (50 mg total) by mouth 2 (two) times daily.   mirtazapine 15 MG disintegrating tablet Commonly known as:  REMERON SOL-TAB Take 0.5 tablets (7.5 mg total) by mouth at bedtime.   ondansetron 8 MG tablet Commonly known as:  ZOFRAN Take 1 tablet (8 mg total) by mouth every 8 (eight) hours as needed for nausea.   polyethylene glycol packet Commonly known as:  MIRALAX / GLYCOLAX Take 17 g by mouth daily.   prochlorperazine 10 MG tablet Commonly known as:  COMPAZINE Take 10 mg by mouth every 6 (six) hours as needed for nausea or vomiting.   protein supplement shake Liqd Commonly known as:  PREMIER PROTEIN Take 325 mLs (11 oz total) by mouth 2 (two) times daily between meals.   rivaroxaban 20 MG Tabs tablet Commonly known as:  XARELTO Take 1 tablet (20 mg total) by mouth daily.       Disposition and Follow-up: Return to [facility] on 11/21/17 for treatment with [place] follow-up  Significant Diagnostic Studies:  Dg Chest 2 View  Result Date: 11/15/2017 CLINICAL DATA:  Fever.  Cough and congestion. EXAM: CHEST - 2 VIEW COMPARISON:  06/16/2017. FINDINGS: PowerPort catheter with tip over superior vena cava. Heart size normal. Mild bibasilar atelectasis and/or scarring. No pleural effusion or pneumothorax. Degenerative change thoracic spine. IMPRESSION: 1.  PowerPort catheter with tip over superior vena cava. 2.  Mild bibasilar atelectasis and or scarring. Electronically Signed   By: Marcello Moores  Register   On: 11/15/2017 12:05   Ct Head Wo Contrast  Result Date: 10/24/2017 CLINICAL DATA:  52 year old female with history of stroke presenting with  focal neurological deficit. EXAM: CT HEAD WITHOUT CONTRAST TECHNIQUE: Contiguous axial images were obtained from the base of the skull through the vertex without intravenous contrast. COMPARISON:  Brain MRI dated 03/23/2017 FINDINGS: Brain: The ventricles and sulci appropriate size for patient's age. Subcentimeter hypodense focus in the left internal capsule medial to the left lentiform nucleus similar to prior MRI most consistent with a small old infarct. There is no acute intracranial hemorrhage. No mass effect or midline shift. No extra-axial fluid collection. Vascular: No hyperdense vessel or unexpected calcification. Skull: Normal. Negative for fracture or focal lesion. Sinuses/Orbits: Partially visualized right maxillary sinus retention cyst or polyp. The remainder of the visualized paranasal sinuses and mastoid air cells are clear. Other: None IMPRESSION: 1. No acute intracranial pathology. 2. Small focus of old lacunar infarct in the left internal capsule. Electronically Signed   By: Anner Crete M.D.   On: 10/24/2017 00:45   Mr Jeri Cos And Wo Contrast  Result Date: 10/24/2017 CLINICAL DATA:  Feeling unwell, symptoms similar to prior stroke. Nausea and vomiting for 3 weeks. History of metastatic cervical cancer, on chemotherapy, hypertension, hyperlipidemia. EXAM: MRI HEAD WITHOUT AND WITH CONTRAST TECHNIQUE: Multiplanar, multiecho pulse sequences of the brain and surrounding structures were obtained without and with intravenous contrast. CONTRAST:  57mL MULTIHANCE GADOBENATE DIMEGLUMINE 529 MG/ML IV SOLN COMPARISON:  CT HEAD October 24, 2017 and MRI of the head March 23, 2017. FINDINGS: INTRACRANIAL CONTENTS: No reduced diffusion to suggest acute ischemia or hypercellular tumor. No susceptibility artifact to suggest hemorrhage. Mild parenchymal brain volume loss. No hydrocephalus. Old bilateral basal ganglia lacunar infarcts. A few scattered subcentimeter supratentorial white matter FLAIR T2  hyperintensities compatible with mild chronic small vessel ischemic changes. No suspicious parenchymal signal, masses, mass effect. No abnormal intraparenchymal or extra-axial enhancement. No abnormal extra-axial fluid collections. No extra-axial masses. VASCULAR: Normal major intracranial vascular flow voids present at skull base. SKULL AND UPPER CERVICAL SPINE: No abnormal sellar expansion. Similar heterogeneous bone marrow signal, potentially treatment related. Craniocervical junction maintained. SINUSES/ORBITS: RIGHT maxillary mucosal retention cyst. Minimal fluid signal LEFT mastoid air cell.The included ocular globes and orbital contents are non-suspicious. OTHER: None. IMPRESSION: 1. No acute intracranial process or metastatic disease. 2. Old basal ganglia lacunar infarcts and mild chronic small vessel ischemic changes. 3. Mild parenchymal brain volume loss. Electronically Signed   By: Elon Alas M.D.   On: 10/24/2017 05:54   Ct Abdomen Pelvis W Contrast  Result Date: 10/21/2017 CLINICAL DATA:  Metastatic cervical cancer. Restaging. History of chemotherapy and radiation therapy. EXAM: CT ABDOMEN AND PELVIS WITH CONTRAST TECHNIQUE: Multidetector CT imaging of the abdomen and pelvis was performed using the standard protocol following bolus administration of intravenous contrast. CONTRAST:  14mL OMNIPAQUE IOHEXOL 300 MG/ML  SOLN COMPARISON:  08/08/2017 CT abdomen/pelvis. FINDINGS: Lower chest: Mild scarring versus atelectasis the right lung base. Hepatobiliary: Diffuse hepatic steatosis. Multiple (at least 5)  heterogeneously hyperenhancing poorly marginated liver masses scattered throughout the liver, increased in size and number, with representative masses as follows: -dominant segment 5 right liver lobe 11.8 x 5.2 cm mass (series 2/image 31), increased from 8.0 x 3.4 cm -anterior segment 4A left liver lobe 2.4 x 2.4 cm mass (series 2/image 21), increased from 0.9 x 0.7 cm -segment 8 right liver  lobe 1.9 x 1.7 cm mass (series 2/image 16), new -inferior segment 5 right liver lobe 2.2 x 2.0 cm mass (series 2/image 45), new Normal gallbladder with no radiopaque cholelithiasis. No biliary ductal dilatation. Pancreas: Normal, with no mass or duct dilation. Spleen: Normal size. No mass. Adrenals/Urinary Tract: Normal adrenals. Punctate nonobstructing interpolar right renal stone. No hydronephrosis. Scattered subcentimeter hypodense renal cortical lesions in both kidneys are too small to characterize and not appreciably changed. No new renal lesions. Collapsed normal bladder. Stomach/Bowel: Normal non-distended stomach. Normal caliber small bowel with no small bowel wall thickening. Normal appendix. Normal large bowel with no diverticulosis, large bowel wall thickening or pericolonic fat stranding. Vascular/Lymphatic: Atherosclerotic nonaneurysmal abdominal aorta. Patent portal, splenic, hepatic and renal veins. No pathologically enlarged lymph nodes in the abdomen or pelvis. Reproductive: No adnexal mass. Stable mildly enlarged anteverted uterus. No discrete mass in the cervix by CT. Other: No pneumoperitoneum, ascites or focal fluid collection. Left pelvic sidewall 4.9 x 2.0 cm poorly marginated soft tissue mass (series 2/image 81), previously 4.8 x 2.9 cm, mildly decreased. Musculoskeletal: No aggressive appearing focal osseous lesions. Mild thoracolumbar spondylosis. IMPRESSION: 1. Interval progression of liver metastases. 2. Poorly marginated left pelvic side wall soft tissue mass is mildly decreased in size. 3. Diffuse hepatic steatosis. 4.  Aortic Atherosclerosis (ICD10-I70.0). Electronically Signed   By: Ilona Sorrel M.D.   On: 10/21/2017 12:02    Discharge Laboratory Values: Lab Results  Component Value Date   WBC 11.3 (H) 11/18/2017   HGB 9.3 (L) 11/18/2017   HCT 30.4 (L) 11/18/2017   MCV 86.1 11/18/2017   PLT PLATELET CLUMPS NOTED ON SMEAR, UNABLE TO ESTIMATE 11/18/2017   Lab Results   Component Value Date   NA 131 (L) 11/18/2017   K 3.6 11/18/2017   CL 94 (L) 11/18/2017   CO2 27 11/18/2017    Brief H and P: For complete details please refer to admission H and P, but in brief, the patient was admitted to the hospital due to intractable nausea, vomiting, dehydration, electrolyte imbalance and severe depression.  While hospitalized, she received aggressive IV fluid resuscitation with daily schedule intravenous antiemetics.  She was seen by psychiatrist.  Kellie Shropshire was prescribed. She has gradual improvement of her symptoms and was discharged with follow-up in the outpatient.  Physical Exam at Discharge: BP 126/63 (BP Location: Right Arm)   Pulse 79   Temp 98.1 F (36.7 C) (Oral)   Resp 17   Ht 5\' 8"  (1.727 m)   Wt 231 lb 7.7 oz (105 kg)   LMP 02/12/2012   SpO2 99%   BMI 35.20 kg/m  GENERAL:alert, no distress and comfortable SKIN: skin color, texture, turgor are normal, no rashes or significant lesions EYES: normal, Conjunctiva are pink and non-injected, sclera clear OROPHARYNX:no exudate, no erythema and lips, buccal mucosa, and tongue normal  NECK: supple, thyroid normal size, non-tender, without nodularity LYMPH:  no palpable lymphadenopathy in the cervical, axillary or inguinal LUNGS: clear to auscultation and percussion with normal breathing effort HEART: regular rate & rhythm and no murmurs and no lower extremity edema ABDOMEN:abdomen soft, non-tender and  normal bowel sounds Musculoskeletal:no cyanosis of digits and no clubbing  NEURO: alert & oriented x 3 with fluent speech, no focal motor/sensory deficits  Hospital Course:  Principal Problem:   MDD (major depressive disorder), recurrent severe, without psychosis ([place]) Active Problems:   Intractable nausea and vomiting   Malnutrition of moderate degree   Diet:  Regular  Activity:  As tolerated  Condition at Discharge:   stable  Signed: Dr. [name] [phone]  11/18/2017, 9:11 AM  Spent  35 minutes on discharge.

## 2017-11-20 LAB — CULTURE, BLOOD (SINGLE): Culture: NO GROWTH

## 2017-11-21 ENCOUNTER — Inpatient Hospital Stay: Payer: 59

## 2017-11-21 ENCOUNTER — Telehealth: Payer: Self-pay | Admitting: Hematology and Oncology

## 2017-11-21 ENCOUNTER — Inpatient Hospital Stay (HOSPITAL_BASED_OUTPATIENT_CLINIC_OR_DEPARTMENT_OTHER): Payer: 59 | Admitting: Hematology and Oncology

## 2017-11-21 ENCOUNTER — Inpatient Hospital Stay: Payer: 59 | Admitting: Hematology and Oncology

## 2017-11-21 ENCOUNTER — Encounter: Payer: Self-pay | Admitting: Hematology and Oncology

## 2017-11-21 VITALS — BP 141/91 | HR 99 | Temp 98.1°F | Resp 18

## 2017-11-21 DIAGNOSIS — R5381 Other malaise: Secondary | ICD-10-CM

## 2017-11-21 DIAGNOSIS — F332 Major depressive disorder, recurrent severe without psychotic features: Secondary | ICD-10-CM | POA: Diagnosis not present

## 2017-11-21 DIAGNOSIS — C801 Malignant (primary) neoplasm, unspecified: Secondary | ICD-10-CM

## 2017-11-21 DIAGNOSIS — Z79899 Other long term (current) drug therapy: Secondary | ICD-10-CM

## 2017-11-21 DIAGNOSIS — I10 Essential (primary) hypertension: Secondary | ICD-10-CM

## 2017-11-21 DIAGNOSIS — E119 Type 2 diabetes mellitus without complications: Secondary | ICD-10-CM

## 2017-11-21 DIAGNOSIS — C787 Secondary malignant neoplasm of liver and intrahepatic bile duct: Secondary | ICD-10-CM | POA: Diagnosis not present

## 2017-11-21 DIAGNOSIS — Z7901 Long term (current) use of anticoagulants: Secondary | ICD-10-CM

## 2017-11-21 DIAGNOSIS — Z7984 Long term (current) use of oral hypoglycemic drugs: Secondary | ICD-10-CM

## 2017-11-21 DIAGNOSIS — R112 Nausea with vomiting, unspecified: Secondary | ICD-10-CM

## 2017-11-21 DIAGNOSIS — C539 Malignant neoplasm of cervix uteri, unspecified: Secondary | ICD-10-CM | POA: Diagnosis not present

## 2017-11-21 DIAGNOSIS — G893 Neoplasm related pain (acute) (chronic): Secondary | ICD-10-CM

## 2017-11-21 DIAGNOSIS — C569 Malignant neoplasm of unspecified ovary: Secondary | ICD-10-CM | POA: Diagnosis not present

## 2017-11-21 DIAGNOSIS — Z5112 Encounter for antineoplastic immunotherapy: Secondary | ICD-10-CM | POA: Diagnosis not present

## 2017-11-21 DIAGNOSIS — Z95828 Presence of other vascular implants and grafts: Secondary | ICD-10-CM

## 2017-11-21 MED ORDER — GABAPENTIN 300 MG PO CAPS: 300.0000 mg | ORAL_CAPSULE | Freq: Two times a day (BID) | ORAL | 11 refills | Status: DC

## 2017-11-21 MED ORDER — SODIUM CHLORIDE 0.9% FLUSH
10.0000 mL | INTRAVENOUS | Status: DC | PRN
  Administered 2017-11-21: 10 mL
  Filled 2017-11-21: qty 10

## 2017-11-21 MED ORDER — ALTEPLASE 2 MG IJ SOLR
INTRAMUSCULAR | Status: AC
  Filled 2017-11-21: qty 2

## 2017-11-21 MED ORDER — SODIUM CHLORIDE 0.9 % IV SOLN
Freq: Once | INTRAVENOUS | Status: AC
  Administered 2017-11-21: 16:00:00 via INTRAVENOUS
  Filled 2017-11-21: qty 250

## 2017-11-21 MED ORDER — HEPARIN SOD (PORK) LOCK FLUSH 100 UNIT/ML IV SOLN
500.0000 [IU] | Freq: Once | INTRAVENOUS | Status: AC | PRN
  Administered 2017-11-21: 500 [IU]
  Filled 2017-11-21: qty 5

## 2017-11-21 MED ORDER — ALTEPLASE 2 MG IJ SOLR
2.0000 mg | Freq: Once | INTRAMUSCULAR | Status: AC
  Administered 2017-11-21: 2 mg
  Filled 2017-11-21: qty 2

## 2017-11-21 MED ORDER — ONDANSETRON HCL 4 MG/2ML IJ SOLN
INTRAMUSCULAR | Status: AC
  Filled 2017-11-21: qty 4

## 2017-11-21 MED ORDER — SODIUM CHLORIDE 0.9 % IV SOLN
200.0000 mg | Freq: Once | INTRAVENOUS | Status: AC
  Administered 2017-11-21: 200 mg via INTRAVENOUS
  Filled 2017-11-21: qty 8

## 2017-11-21 MED ORDER — ONDANSETRON HCL 4 MG/2ML IJ SOLN
8.0000 mg | Freq: Once | INTRAMUSCULAR | Status: AC
  Administered 2017-11-21: 8 mg via INTRAVENOUS

## 2017-11-21 NOTE — Patient Instructions (Addendum)
[facility] Discharge Instructions for Patients Receiving Chemotherapy  Today you received the following chemotherapy agents: Pembrolizumab (Keytruda)  To help prevent nausea and vomiting after your treatment, we encourage you to take your nausea medication as directed.    If you develop nausea and vomiting that is not controlled by your nausea medication, call the clinic.   BELOW ARE SYMPTOMS THAT SHOULD BE REPORTED IMMEDIATELY:  *FEVER GREATER THAN 100.5 F  *CHILLS WITH OR WITHOUT FEVER  NAUSEA AND VOMITING THAT IS NOT CONTROLLED WITH YOUR NAUSEA MEDICATION  *UNUSUAL SHORTNESS OF BREATH  *UNUSUAL BRUISING OR BLEEDING  TENDERNESS IN MOUTH AND THROAT WITH OR WITHOUT PRESENCE OF ULCERS  *URINARY PROBLEMS  *BOWEL PROBLEMS  UNUSUAL RASH Items with * indicate a potential emergency and should be followed up as soon as possible.  Feel free to call the clinic should you have any questions or concerns. The clinic phone number is [phone].  Please show the CHEMO ALERT CARD at check-in to the Emergency Department and triage nurse. 

## 2017-11-21 NOTE — Progress Notes (Signed)
Unable to achieve adequate blood return from port. Cath flo was placed, but still was unable to receive adequate blood return.  Port flushes without resistance. Pt denies any discomfort. Last Chest X-ray shows placement in the SVC. Per Dr. Alvy Bimler, ok to treat today.

## 2017-11-21 NOTE — Assessment & Plan Note (Signed)
Nausea and vomiting were better controlled last weekend but she had nausea and vomiting today Due to excessive sedation, I recommend reducing the need to take Compazine or Phenergan and to use Zofran only as needed She will continue IV fluids support on a regular basis

## 2017-11-21 NOTE — Assessment & Plan Note (Signed)
She has severe major depression She is to tolerating Remeron I plan to reduce the dose of Celexa I recommend her family to not give her Klonopin if possible to reduce the risk of excessive sedation I am planning to wean her off Celexa slowly over the next few weeks.

## 2017-11-21 NOTE — Assessment & Plan Note (Signed)
She has significant physical deconditioning I recommend home physical therapy I have discussed with [facility] to make this happen

## 2017-11-21 NOTE — Assessment & Plan Note (Signed)
Recent liver enzymes were within normal limits.  Monitor closely

## 2017-11-21 NOTE — Assessment & Plan Note (Signed)
She continues to be quite frail but slightly improved compared to last week Due to excessive sedation and weakness, I am planning to try to wean her off unnecessary medications I recommend continued daily IV fluid support, IV magnesium this week through [facility] home [facility] service We will continue with second dose of immunotherapy today I plan minimum 3 doses of treatment before repeat imaging study I will see her back next week for further follow-up

## 2017-11-21 NOTE — Telephone Encounter (Signed)
Gave avs and calendar ° °

## 2017-11-21 NOTE — Assessment & Plan Note (Signed)
She had severe hypomagnesemia She will continue aggressive magnesium replacement therapy and I plan to recheck it next week

## 2017-11-21 NOTE — Progress Notes (Signed)
[place] OFFICE PROGRESS NOTE  Patient Care Team: Lennie Odor, PA-C as PCP - General (Nurse Practitioner) Arvella Nigh, MD as Obstetrician (Obstetrics and Gynecology) Evans Lance, MD as Consulting Physician (Cardiology) Serena Colonel, RN as [place] Management  ASSESSMENT & PLAN:  Cancer of unknown origin [facility]) She continues to be quite frail but slightly improved compared to last week Due to excessive sedation and weakness, I am planning to try to wean her off unnecessary medications I recommend continued daily IV fluid support, IV magnesium this week through advanced home care service We will continue with second dose of immunotherapy today I plan minimum 3 doses of treatment before repeat imaging study I will see her back next week for further follow-up  Metastasis to liver [facility]) Recent liver enzymes were within normal limits.  Monitor closely  Cancer associated pain She has less cancer pain I recommend reducing gabapentin to 300 mg twice a day and to take narcotic prescription only if needed  Nausea with vomiting Nausea and vomiting were better controlled last weekend but she had nausea and vomiting today Due to excessive sedation, I recommend reducing the need to take Compazine or Phenergan and to use Zofran only as needed She will continue IV fluids support on a regular basis  Hypomagnesemia She had severe hypomagnesemia She will continue aggressive magnesium replacement therapy and I plan to recheck it next week  Physical deconditioning She has significant physical deconditioning I recommend home physical therapy I have discussed with advanced home care to make this happen  MDD (major depressive disorder), recurrent severe, without psychosis ([place]) She has severe major depression She is to tolerating Remeron I plan to reduce the dose of Celexa I recommend her family to not give her Klonopin if possible to reduce the  risk of excessive sedation I am planning to wean her off Celexa slowly over the next few weeks.   No orders of the defined types were placed in this encounter.   INTERVAL HISTORY: Please see below for problem oriented charting. She is seen in the infusion room.  Her daughter is present. Since discharge from the hospital, the patient continues to feel weak and sedated.  Her appetite has improved.  She is noted to be excessively sedated over the last few days.  No recent falls.  She had some nausea today. Had vomiting this morning. She is somewhat feeling better compared to last week.  Denies suicidal ideation.  SUMMARY OF ONCOLOGIC HISTORY: Oncology History   Biothernostics: 96% probability cervical cancer  Additional evaluation revealed PTEN deletion, negative for MET amplification, PD-L1 expression of 60     Metastasis to liver ([place])   03/03/2017 Initial Diagnosis    Metastasis to liver ([place])    08/08/2017 - 11/02/2017 Chemotherapy    The patient had bevacizumab (AVASTIN) 1,800 mg in sodium chloride 0.9 % 100 mL chemo infusion, 15 mg/kg = 1,800 mg, Intravenous,  Once, 4 of 5 cycles Administration: 1,800 mg (08/09/2017), 1,800 mg (09/01/2017), 1,800 mg (09/22/2017), 1,800 mg (10/13/2017)  for chemotherapy treatment.     10/25/2017 -  Chemotherapy    The patient had pembrolizumab (KEYTRUDA) 200 mg in sodium chloride 0.9 % 50 mL chemo infusion, 200 mg, Intravenous, Once, 1 of 6 cycles Administration: 200 mg (10/31/2017)  for chemotherapy treatment.      Cancer of unknown origin ([place])   03/03/2017 Imaging    1. Multifocal liver metastasis. 2. Left posterior pelvic mass extending into the obturator  foramen measures up to 7 cm. Suspicious for malignancy. This should be easily amendable to percutaneous tissue sampling under image guidance. 3. No lytic bone lesions identified of myeloma.    03/09/2017 Procedure    Technically successful ultrasound-guided core liver lesion biopsy    03/09/2017  Pathology Results    A. LIVER LESION, RIGHT LOBE; ULTRASOUND-GUIDED BIOPSY:  - METASTATIC SQUAMOUS CELL CARCINOMA, SEE COMMENT.   Comment: Metastatic carcinoma is immunoreactive for p40, p16, p53 and pancytokeratin while CK7, CK20, and TTF1 are negative. Significance of p16 positivity depends on the site of origin and the pattern of immunoreactivity in this material is non-specific. Possible sites of origin include gynecologic, anal, lung, and head/neck. Correlation with physical exam and additional imaging is required.     03/18/2017 Pathology Results    She had GYN exam and pap smear. Result is negative for malignancy    03/21/2017 - 04/01/2017 Hospital Admission    She was admitted to the hospital for pain management and had radiation therapy    03/23/2017 Procedure    Ultrasound and fluoroscopically guided right internal jugular single lumen power port catheter insertion. Tip in the SVC/RA junction. Catheter ready for use.    05/16/2017 Imaging    Mild decrease in size of left posterior pelvic soft tissue mass and liver metastases. No new or progressive metastatic disease identified.  Stable hepatic steatosis and tiny nonobstructing right renal calculus.    08/08/2017 Imaging    Decreased liver metastases.  Decreased size of left internal iliac mass or lymphadenopathy, which abuts the left sacrum.  No new or progressive metastatic disease.  Stable severe hepatic steatosis.    08/08/2017 - 11/02/2017 Chemotherapy    The patient had bevacizumab (AVASTIN) 1,800 mg in sodium chloride 0.9 % 100 mL chemo infusion, 15 mg/kg = 1,800 mg, Intravenous,  Once, 4 of 5 cycles Administration: 1,800 mg (08/09/2017), 1,800 mg (09/01/2017), 1,800 mg (09/22/2017), 1,800 mg (10/13/2017)  for chemotherapy treatment.     10/21/2017 Imaging    1. Interval progression of liver metastases. 2. Poorly marginated left pelvic side wall soft tissue mass is mildly decreased in size. 3. Diffuse hepatic steatosis. 4.  Aortic Atherosclerosis (ICD10-I70.0).    10/24/2017 Imaging    1. No acute intracranial process or metastatic disease. 2. Old basal ganglia lacunar infarcts and mild chronic small vessel ischemic changes. 3. Mild parenchymal brain volume loss.     10/25/2017 -  Chemotherapy    The patient had pembrolizumab (KEYTRUDA) 200 mg in sodium chloride 0.9 % 50 mL chemo infusion, 200 mg, Intravenous, Once, 1 of 6 cycles Administration: 200 mg (10/31/2017)  for chemotherapy treatment.      Cervical cancer ([place])   07/20/2017 Initial Diagnosis    Cervical cancer ([place])    08/08/2017 - 11/02/2017 Chemotherapy    The patient had bevacizumab (AVASTIN) 1,800 mg in sodium chloride 0.9 % 100 mL chemo infusion, 15 mg/kg = 1,800 mg, Intravenous,  Once, 4 of 5 cycles Administration: 1,800 mg (08/09/2017), 1,800 mg (09/01/2017), 1,800 mg (09/22/2017), 1,800 mg (10/13/2017)  for chemotherapy treatment.     10/25/2017 -  Chemotherapy    The patient had pembrolizumab (KEYTRUDA) 200 mg in sodium chloride 0.9 % 50 mL chemo infusion, 200 mg, Intravenous, Once, 1 of 6 cycles Administration: 200 mg (10/31/2017)  for chemotherapy treatment.      REVIEW OF SYSTEMS:   Constitutional: Denies fevers, chills or abnormal weight loss Eyes: Denies blurriness of vision Ears, nose, mouth, throat, and face: Denies  mucositis or sore throat Respiratory: Denies cough, dyspnea or wheezes Cardiovascular: Denies palpitation, chest discomfort or lower extremity swelling Skin: Denies abnormal skin rashes Lymphatics: Denies new lymphadenopathy or easy bruising Behavioral/Psych: Mood is stable, no new changes  All other systems were reviewed with the patient and are negative.  I have reviewed the past medical history, past surgical history, social history and family history with the patient and they are unchanged from previous note.  ALLERGIES:  has No Known Allergies.  MEDICATIONS:  Current Outpatient Medications  Medication Sig Dispense  Refill  . atorvastatin (LIPITOR) 40 MG tablet Take 1 tablet (40 mg total) by mouth daily at 6 PM. 30 tablet 0  . cholecalciferol (VITAMIN D) 1000 units tablet Take 2,000 Units by mouth daily.    . citalopram (CELEXA) 40 MG tablet Take 20 mg by mouth daily.  1  . clonazePAM (KLONOPIN) 0.5 MG tablet Take 1 tablet (0.5 mg total) by mouth at bedtime as needed (sedation). 30 tablet 0  . flecainide (TAMBOCOR) 50 MG tablet Take 1 tablet (50 mg total) by mouth daily. 90 tablet 3  . gabapentin (NEURONTIN) 300 MG capsule Take 1 capsule (300 mg total) by mouth 2 (two) times daily. 90 capsule 11  . HYDROmorphone (DILAUDID) 4 MG tablet Take 1 tablet (4 mg total) by mouth every 6 (six) hours as needed for severe pain. (Patient not taking: Reported on 11/15/2017) 60 tablet 0  . lidocaine-prilocaine (EMLA) cream APPLY 1 APPLICATION TO AFFECTED AREA(S) TOPICALLY AS NEEDED (Patient taking differently: Apply 1 application topically once. ) 30 g 9  . magnesium oxide (MAG-OX) 400 (241.3 Mg) MG tablet Take 1 tablet (400 mg total) by mouth 2 (two) times daily. 60 tablet 11  . metFORMIN (GLUCOPHAGE) 500 MG tablet Take 1 tablet (500 mg total) by mouth 2 (two) times daily. 60 tablet 11  . metoprolol tartrate (LOPRESSOR) 50 MG tablet Take 1 tablet (50 mg total) by mouth 2 (two) times daily. 90 tablet 3  . mirtazapine (REMERON SOL-TAB) 15 MG disintegrating tablet Take 0.5 tablets (7.5 mg total) by mouth at bedtime. 30 tablet 9  . ondansetron (ZOFRAN) 8 MG tablet Take 1 tablet (8 mg total) by mouth every 8 (eight) hours as needed for nausea. 90 tablet 1  . polyethylene glycol (MIRALAX / GLYCOLAX) packet Take 17 g by mouth daily. (Patient not taking: Reported on 09/15/2017) 14 each 0  . prochlorperazine (COMPAZINE) 10 MG tablet Take 10 mg by mouth every 6 (six) hours as needed for nausea or vomiting.    . protein supplement shake (PREMIER PROTEIN) LIQD Take 325 mLs (11 oz total) by mouth 2 (two) times daily between meals. 60 Can 9   . rivaroxaban (XARELTO) 20 MG TABS tablet Take 1 tablet (20 mg total) by mouth daily. 30 tablet 0   No current facility-administered medications for this visit.     PHYSICAL EXAMINATION: ECOG PERFORMANCE STATUS: 2 - Symptomatic, <50% confined to bed GENERAL:alert, no distress and comfortable SKIN: skin color, texture, turgor are normal, no rashes or significant lesions EYES: normal, Conjunctiva are pink and non-injected, sclera clear OROPHARYNX:no exudate, no erythema and lips, buccal mucosa, and tongue normal  NECK: supple, thyroid normal size, non-tender, without nodularity LYMPH:  no palpable lymphadenopathy in the cervical, axillary or inguinal LUNGS: clear to auscultation and percussion with normal breathing effort HEART: regular rate & rhythm and no murmurs and no lower extremity edema ABDOMEN:abdomen soft, non-tender and normal bowel sounds Musculoskeletal:no cyanosis of digits and no  clubbing  NEURO: alert & oriented x 3 with fluent speech, no focal motor/sensory deficits  LABORATORY DATA:  I have reviewed the data as listed    Component Value Date/Time   NA 131 (L) 11/18/2017 0408   K 3.6 11/18/2017 0408   CL 94 (L) 11/18/2017 0408   CO2 27 11/18/2017 0408   GLUCOSE 118 (H) 11/18/2017 0408   BUN 7 11/18/2017 0408   CREATININE 0.54 11/18/2017 0408   CREATININE 0.81 06/28/2017 0851   CALCIUM 7.9 (L) 11/18/2017 0408   PROT 6.3 (L) 11/18/2017 0408   ALBUMIN 2.2 (L) 11/18/2017 0408   AST 43 (H) 11/18/2017 0408   AST 43 (H) 06/28/2017 0851   ALT 23 11/18/2017 0408   ALT 55 06/28/2017 0851   ALKPHOS 114 11/18/2017 0408   BILITOT 0.7 11/18/2017 0408   BILITOT 0.6 06/28/2017 0851   GFRNONAA >60 11/18/2017 0408   GFRNONAA >60 06/28/2017 0851   GFRAA >60 11/18/2017 0408   GFRAA >60 06/28/2017 0851    No results found for: SPEP, UPEP  Lab Results  Component Value Date   WBC 11.3 (H) 11/18/2017   NEUTROABS 7.7 11/18/2017   HGB 9.3 (L) 11/18/2017   HCT 30.4 (L)  11/18/2017   MCV 86.1 11/18/2017   PLT PLATELET CLUMPS NOTED ON SMEAR, UNABLE TO ESTIMATE 11/18/2017      Chemistry      Component Value Date/Time   NA 131 (L) 11/18/2017 0408   K 3.6 11/18/2017 0408   CL 94 (L) 11/18/2017 0408   CO2 27 11/18/2017 0408   BUN 7 11/18/2017 0408   CREATININE 0.54 11/18/2017 0408   CREATININE 0.81 06/28/2017 0851      Component Value Date/Time   CALCIUM 7.9 (L) 11/18/2017 0408   ALKPHOS 114 11/18/2017 0408   AST 43 (H) 11/18/2017 0408   AST 43 (H) 06/28/2017 0851   ALT 23 11/18/2017 0408   ALT 55 06/28/2017 0851   BILITOT 0.7 11/18/2017 0408   BILITOT 0.6 06/28/2017 0851       RADIOGRAPHIC STUDIES: I have personally reviewed the radiological images as listed and agreed with the findings in the report. Dg Chest 2 View  Result Date: 11/15/2017 CLINICAL DATA:  Fever.  Cough and congestion. EXAM: CHEST - 2 VIEW COMPARISON:  06/16/2017. FINDINGS: PowerPort catheter with tip over superior vena cava. Heart size normal. Mild bibasilar atelectasis and/or scarring. No pleural effusion or pneumothorax. Degenerative change thoracic spine. IMPRESSION: 1.  PowerPort catheter with tip over superior vena cava. 2.  Mild bibasilar atelectasis and or scarring. Electronically Signed   By: Marcello Moores  Register   On: 11/15/2017 12:05   Ct Head Wo Contrast  Result Date: 10/24/2017 CLINICAL DATA:  52 year old female with history of stroke presenting with focal neurological deficit. EXAM: CT HEAD WITHOUT CONTRAST TECHNIQUE: Contiguous axial images were obtained from the base of the skull through the vertex without intravenous contrast. COMPARISON:  Brain MRI dated 03/23/2017 FINDINGS: Brain: The ventricles and sulci appropriate size for patient's age. Subcentimeter hypodense focus in the left internal capsule medial to the left lentiform nucleus similar to prior MRI most consistent with a small old infarct. There is no acute intracranial hemorrhage. No mass effect or midline  shift. No extra-axial fluid collection. Vascular: No hyperdense vessel or unexpected calcification. Skull: Normal. Negative for fracture or focal lesion. Sinuses/Orbits: Partially visualized right maxillary sinus retention cyst or polyp. The remainder of the visualized paranasal sinuses and mastoid air cells are clear. Other: None IMPRESSION:  1. No acute intracranial pathology. 2. Small focus of old lacunar infarct in the left internal capsule. Electronically Signed   By: Anner Crete M.D.   On: 10/24/2017 00:45   Mr Jeri Cos And Wo Contrast  Result Date: 10/24/2017 CLINICAL DATA:  Feeling unwell, symptoms similar to prior stroke. Nausea and vomiting for 3 weeks. History of metastatic cervical cancer, on chemotherapy, hypertension, hyperlipidemia. EXAM: MRI HEAD WITHOUT AND WITH CONTRAST TECHNIQUE: Multiplanar, multiecho pulse sequences of the brain and surrounding structures were obtained without and with intravenous contrast. CONTRAST:  42m MULTIHANCE GADOBENATE DIMEGLUMINE 529 MG/ML IV SOLN COMPARISON:  CT HEAD October 24, 2017 and MRI of the head March 23, 2017. FINDINGS: INTRACRANIAL CONTENTS: No reduced diffusion to suggest acute ischemia or hypercellular tumor. No susceptibility artifact to suggest hemorrhage. Mild parenchymal brain volume loss. No hydrocephalus. Old bilateral basal ganglia lacunar infarcts. A few scattered subcentimeter supratentorial white matter FLAIR T2 hyperintensities compatible with mild chronic small vessel ischemic changes. No suspicious parenchymal signal, masses, mass effect. No abnormal intraparenchymal or extra-axial enhancement. No abnormal extra-axial fluid collections. No extra-axial masses. VASCULAR: Normal major intracranial vascular flow voids present at skull base. SKULL AND UPPER CERVICAL SPINE: No abnormal sellar expansion. Similar heterogeneous bone marrow signal, potentially treatment related. Craniocervical junction maintained. SINUSES/ORBITS: RIGHT  maxillary mucosal retention cyst. Minimal fluid signal LEFT mastoid air cell.The included ocular globes and orbital contents are non-suspicious. OTHER: None. IMPRESSION: 1. No acute intracranial process or metastatic disease. 2. Old basal ganglia lacunar infarcts and mild chronic small vessel ischemic changes. 3. Mild parenchymal brain volume loss. Electronically Signed   By: CElon AlasM.D.   On: 10/24/2017 05:54    All questions were answered. The patient knows to call the clinic with any problems, questions or concerns. No barriers to learning was detected.  I spent 30 minutes counseling the patient face to face. The total time spent in the appointment was 40 minutes and more than 50% was on counseling and review of test results  NHeath Lark MD 11/21/2017 3:55 PM

## 2017-11-21 NOTE — Assessment & Plan Note (Signed)
She has less cancer pain I recommend reducing gabapentin to 300 mg twice a day and to take narcotic prescription only if needed

## 2017-11-22 ENCOUNTER — Telehealth: Payer: Self-pay

## 2017-11-22 DIAGNOSIS — C787 Secondary malignant neoplasm of liver and intrahepatic bile duct: Secondary | ICD-10-CM | POA: Diagnosis not present

## 2017-11-22 DIAGNOSIS — C801 Malignant (primary) neoplasm, unspecified: Secondary | ICD-10-CM | POA: Diagnosis not present

## 2017-11-22 DIAGNOSIS — C539 Malignant neoplasm of cervix uteri, unspecified: Secondary | ICD-10-CM | POA: Diagnosis not present

## 2017-11-22 DIAGNOSIS — G4733 Obstructive sleep apnea (adult) (pediatric): Secondary | ICD-10-CM | POA: Diagnosis not present

## 2017-11-22 NOTE — Telephone Encounter (Signed)
Called [name] with [facility] to evaluate port for blood return and cathflo if needed per Dr. Alvy Bimler. She verbalized understanding.

## 2017-11-24 ENCOUNTER — Telehealth: Payer: Self-pay

## 2017-11-24 NOTE — Telephone Encounter (Signed)
Cindy Sayers, RN with [facility] to office to report [facility] nurse did trouble shooting with port and she was able to get blood return.   Reported above to Dr. Alvy Bimler.

## 2017-11-25 ENCOUNTER — Other Ambulatory Visit: Payer: Self-pay | Admitting: *Deleted

## 2017-11-25 NOTE — Patient Outreach (Addendum)
[facility]) Care Management  11/25/2017  Cindy Faulkner [facility] 1965/09/10 [id number]   Subjective: Telephone call to patient's home  / mobile number, no answer, left HIPAA compliant voicemail message, and requested call back.    Objective:Per KPN (Knowledge Performance Now, point of care tool) and chart review,patient hospitalized 11/15/17 -11/18/17 for Intractable nausea vomiting, MDD (major depressive disorder), recurrent severe, without psychosis,  Malnutrition of moderate degree.    Patient hospitalized 03/21/17 -04/01/17 for pain management,metastatic squamous cell carcinoma from unknown origin with metastases to sacrum,and liver region. Patient also has a history ofObstructive sleep apnea,Sacral mass,Metastasis to liver,Cancer of unknown origin,Metastasis to liver of unknown origin, radiation therapy, palliative care,Atrial fibrillation, and cardiovascular accident (CVA). [facility] Care Management completed last transition of care follow up on 04/18/17.      Assessment: Received [facility] Transition of care referral on 11/16/17. Transition of care follow up pending patient contact.      Plan:RNCM will send unsuccessful outreach  letter, [facility] pamphlet, will call patient for 2nd telephone outreach attempt, transition of care follow up, and proceed with case closure, within 10 business days if no return call.        Atley Scarboro H. Annia Friendly, BSN, [place] Management [facility] Telephonic CM Phone: [phone] Fax: [phone]

## 2017-11-28 ENCOUNTER — Inpatient Hospital Stay: Payer: 59 | Attending: Hematology and Oncology

## 2017-11-28 ENCOUNTER — Other Ambulatory Visit: Payer: Self-pay | Admitting: Internal Medicine

## 2017-11-28 ENCOUNTER — Telehealth: Payer: Self-pay | Admitting: Hematology and Oncology

## 2017-11-28 ENCOUNTER — Other Ambulatory Visit: Payer: Self-pay | Admitting: Hematology and Oncology

## 2017-11-28 ENCOUNTER — Inpatient Hospital Stay (HOSPITAL_BASED_OUTPATIENT_CLINIC_OR_DEPARTMENT_OTHER): Payer: 59 | Admitting: Hematology and Oncology

## 2017-11-28 ENCOUNTER — Telehealth: Payer: Self-pay

## 2017-11-28 VITALS — BP 106/78 | HR 117 | Temp 99.0°F | Resp 18 | Ht 68.0 in | Wt 224.6 lb

## 2017-11-28 DIAGNOSIS — E876 Hypokalemia: Secondary | ICD-10-CM

## 2017-11-28 DIAGNOSIS — G893 Neoplasm related pain (acute) (chronic): Secondary | ICD-10-CM | POA: Diagnosis not present

## 2017-11-28 DIAGNOSIS — E43 Unspecified severe protein-calorie malnutrition: Secondary | ICD-10-CM | POA: Diagnosis not present

## 2017-11-28 DIAGNOSIS — D6481 Anemia due to antineoplastic chemotherapy: Secondary | ICD-10-CM | POA: Diagnosis not present

## 2017-11-28 DIAGNOSIS — Z452 Encounter for adjustment and management of vascular access device: Secondary | ICD-10-CM | POA: Insufficient documentation

## 2017-11-28 DIAGNOSIS — F332 Major depressive disorder, recurrent severe without psychotic features: Secondary | ICD-10-CM | POA: Diagnosis not present

## 2017-11-28 DIAGNOSIS — E440 Moderate protein-calorie malnutrition: Secondary | ICD-10-CM | POA: Diagnosis not present

## 2017-11-28 DIAGNOSIS — C787 Secondary malignant neoplasm of liver and intrahepatic bile duct: Secondary | ICD-10-CM

## 2017-11-28 DIAGNOSIS — R5381 Other malaise: Secondary | ICD-10-CM

## 2017-11-28 DIAGNOSIS — Z7901 Long term (current) use of anticoagulants: Secondary | ICD-10-CM | POA: Insufficient documentation

## 2017-11-28 DIAGNOSIS — C801 Malignant (primary) neoplasm, unspecified: Secondary | ICD-10-CM | POA: Diagnosis not present

## 2017-11-28 DIAGNOSIS — E119 Type 2 diabetes mellitus without complications: Secondary | ICD-10-CM | POA: Diagnosis not present

## 2017-11-28 DIAGNOSIS — Z79899 Other long term (current) drug therapy: Secondary | ICD-10-CM | POA: Diagnosis not present

## 2017-11-28 DIAGNOSIS — Z5112 Encounter for antineoplastic immunotherapy: Secondary | ICD-10-CM | POA: Diagnosis not present

## 2017-11-28 DIAGNOSIS — R300 Dysuria: Secondary | ICD-10-CM

## 2017-11-28 DIAGNOSIS — C539 Malignant neoplasm of cervix uteri, unspecified: Secondary | ICD-10-CM | POA: Diagnosis not present

## 2017-11-28 DIAGNOSIS — I6330 Cerebral infarction due to thrombosis of unspecified cerebral artery: Secondary | ICD-10-CM

## 2017-11-28 DIAGNOSIS — E871 Hypo-osmolality and hyponatremia: Secondary | ICD-10-CM | POA: Insufficient documentation

## 2017-11-28 DIAGNOSIS — Z95828 Presence of other vascular implants and grafts: Secondary | ICD-10-CM

## 2017-11-28 DIAGNOSIS — G63 Polyneuropathy in diseases classified elsewhere: Secondary | ICD-10-CM | POA: Diagnosis not present

## 2017-11-28 DIAGNOSIS — E1140 Type 2 diabetes mellitus with diabetic neuropathy, unspecified: Secondary | ICD-10-CM | POA: Diagnosis not present

## 2017-11-28 DIAGNOSIS — R112 Nausea with vomiting, unspecified: Secondary | ICD-10-CM | POA: Diagnosis not present

## 2017-11-28 DIAGNOSIS — I480 Paroxysmal atrial fibrillation: Secondary | ICD-10-CM | POA: Diagnosis not present

## 2017-11-28 DIAGNOSIS — Z7984 Long term (current) use of oral hypoglycemic drugs: Secondary | ICD-10-CM

## 2017-11-28 DIAGNOSIS — E099 Drug or chemical induced diabetes mellitus without complications: Secondary | ICD-10-CM | POA: Diagnosis not present

## 2017-11-28 LAB — CBC WITH DIFFERENTIAL/PLATELET
BASOS ABS: 0.1 10*3/uL (ref 0.0–0.1)
BASOS PCT: 0 %
Eosinophils Absolute: 0 10*3/uL (ref 0.0–0.5)
Eosinophils Relative: 0 %
HEMATOCRIT: 31.2 % — AB (ref 34.8–46.6)
HEMOGLOBIN: 9.9 g/dL — AB (ref 11.6–15.9)
LYMPHS PCT: 7 %
Lymphs Abs: 1.1 10*3/uL (ref 0.9–3.3)
MCH: 26.1 pg (ref 25.1–34.0)
MCHC: 31.8 g/dL (ref 31.5–36.0)
MCV: 82.1 fL (ref 79.5–101.0)
Monocytes Absolute: 1.6 10*3/uL — ABNORMAL HIGH (ref 0.1–0.9)
Monocytes Relative: 10 %
NEUTROS ABS: 12.5 10*3/uL — AB (ref 1.5–6.5)
NEUTROS PCT: 83 %
Platelets: 147 10*3/uL (ref 145–400)
RBC: 3.8 MIL/uL (ref 3.70–5.45)
RDW: 20.6 % — ABNORMAL HIGH (ref 11.2–14.5)
WBC: 15.3 10*3/uL — ABNORMAL HIGH (ref 3.9–10.3)

## 2017-11-28 LAB — COMPREHENSIVE METABOLIC PANEL
ALK PHOS: 192 U/L — AB (ref 38–126)
ALT: 38 U/L (ref 0–44)
AST: 67 U/L — AB (ref 15–41)
Albumin: 2.4 g/dL — ABNORMAL LOW (ref 3.5–5.0)
Anion gap: 12 (ref 5–15)
BILIRUBIN TOTAL: 1.2 mg/dL (ref 0.3–1.2)
BUN: 8 mg/dL (ref 6–20)
CALCIUM: 9 mg/dL (ref 8.9–10.3)
CHLORIDE: 91 mmol/L — AB (ref 98–111)
CO2: 26 mmol/L (ref 22–32)
CREATININE: 0.65 mg/dL (ref 0.44–1.00)
Glucose, Bld: 166 mg/dL — ABNORMAL HIGH (ref 70–99)
Potassium: 3.3 mmol/L — ABNORMAL LOW (ref 3.5–5.1)
Sodium: 129 mmol/L — ABNORMAL LOW (ref 135–145)
Total Protein: 7.2 g/dL (ref 6.5–8.1)

## 2017-11-28 LAB — MAGNESIUM: MAGNESIUM: 1.4 mg/dL — AB (ref 1.7–2.4)

## 2017-11-28 MED ORDER — ONDANSETRON 8 MG PO TBDP
8.0000 mg | ORAL_TABLET | Freq: Once | ORAL | Status: AC
  Administered 2017-11-28: 8 mg via ORAL
  Filled 2017-11-28: qty 1

## 2017-11-28 MED ORDER — ONDANSETRON 8 MG PO TBDP: 4.0000 mg | ORAL_TABLET | Freq: Once | ORAL | Status: DC

## 2017-11-28 MED ORDER — SODIUM CHLORIDE 0.9 % IV SOLN
Freq: Once | INTRAVENOUS | Status: DC
  Filled 2017-11-28: qty 250

## 2017-11-28 NOTE — Telephone Encounter (Signed)
Called [name] with [facility]. Per Dr. Alvy Bimler given verbal order for cathflo for porta cath due to unable get blood return and to do UA and culture. She verbalized understanding.

## 2017-11-28 NOTE — Telephone Encounter (Signed)
Gave pt avs and calendar  °

## 2017-11-29 ENCOUNTER — Ambulatory Visit: Payer: Self-pay | Admitting: *Deleted

## 2017-11-29 ENCOUNTER — Encounter: Payer: Self-pay | Admitting: Hematology and Oncology

## 2017-11-29 ENCOUNTER — Telehealth: Payer: Self-pay

## 2017-11-29 ENCOUNTER — Other Ambulatory Visit: Payer: Self-pay | Admitting: Internal Medicine

## 2017-11-29 ENCOUNTER — Other Ambulatory Visit: Payer: Self-pay

## 2017-11-29 ENCOUNTER — Ambulatory Visit: Payer: Self-pay | Admitting: Neurology

## 2017-11-29 DIAGNOSIS — C787 Secondary malignant neoplasm of liver and intrahepatic bile duct: Secondary | ICD-10-CM | POA: Diagnosis not present

## 2017-11-29 DIAGNOSIS — Z7984 Long term (current) use of oral hypoglycemic drugs: Secondary | ICD-10-CM | POA: Diagnosis not present

## 2017-11-29 DIAGNOSIS — Z79899 Other long term (current) drug therapy: Secondary | ICD-10-CM | POA: Diagnosis not present

## 2017-11-29 DIAGNOSIS — C801 Malignant (primary) neoplasm, unspecified: Secondary | ICD-10-CM | POA: Diagnosis not present

## 2017-11-29 LAB — TSH: TSH: 0.984 u[IU]/mL (ref 0.308–3.960)

## 2017-11-29 MED ORDER — METOPROLOL TARTRATE 50 MG PO TABS: 50.0000 mg | ORAL_TABLET | Freq: Two times a day (BID) | ORAL | 3 refills | Status: DC

## 2017-11-29 MED FILL — METOPROLOL TARTRATE 50 MG T: 50 | 45 days supply | Qty: 90 | Fill #0

## 2017-11-29 MED FILL — FLECAINIDE ACETATE 50 MG TA: 50 | 30 days supply | Qty: 30 | Fill #0

## 2017-11-29 NOTE — Assessment & Plan Note (Signed)
She has significant low magnesium level It is not clear whether she is taking oral magnesium at home We will continue IV magnesium replacement daily along with oral magnesium replacement therapy

## 2017-11-29 NOTE — Assessment & Plan Note (Signed)
Unfortunately, she continues to have progressive decline in performance status She is not participating in all activities of daily living with her family and spent most of her time lying down or sleeping Her blood work is unremarkable and I do not believe this is a contributing factor to her lack of motivation I will continue aggressive IV fluid hydration, magnesium replacement therapy and will get advanced home care to get physical therapy to start PT at home as well

## 2017-11-29 NOTE — Progress Notes (Signed)
FMLA successfully faxed to Leave and Disability Specialist, Luna Fuse at [phone] and [phone]. Mailed copy to patient address on file.

## 2017-11-29 NOTE — Assessment & Plan Note (Signed)
She has significant major depression but denies suicidal ideation She is on citalopram She is unmotivated to participate in activities of daily living Her thyroid function tests was within normal limits We discussed the importance of being active, otherwise I will not be able to prescribe treatment if her performance status continues to decline in the next 2 weeks

## 2017-11-29 NOTE — Assessment & Plan Note (Signed)
Recent liver enzymes were within normal limits.  Monitor closely

## 2017-11-29 NOTE — Assessment & Plan Note (Signed)
She has minimum pain She is noted to be excessively sedated by her prescription medicine I have discontinue all pain medicine and gabapentin

## 2017-11-29 NOTE — Progress Notes (Signed)
[place] OFFICE PROGRESS NOTE  Patient Care Team: Lennie Odor, PA-C as PCP - General (Nurse Practitioner) Arvella Nigh, MD as Obstetrician (Obstetrics and Gynecology) Evans Lance, MD as Consulting Physician (Cardiology) Serena Colonel, RN as [place] Management  ASSESSMENT & PLAN:  Cancer of unknown origin [facility]) Unfortunately, she continues to have progressive decline in performance status She is not participating in all activities of daily living with her family and spent most of her time lying down or sleeping Her blood work is unremarkable and I do not believe this is a contributing factor to her lack of motivation I will continue aggressive IV fluid hydration, magnesium replacement therapy and will get advanced home care to get physical therapy to start PT at home as well  Metastasis to liver [facility]) Recent liver enzymes were within normal limits.  Monitor closely  Cancer associated pain She has minimum pain She is noted to be excessively sedated by her prescription medicine I have discontinue all pain medicine and gabapentin  Physical deconditioning She has profound physical deconditioning and weakness We are replacing magnesium with intravenous route and oral magnesium I will get physical therapy involved  Hypomagnesemia She has significant low magnesium level It is not clear whether she is taking oral magnesium at home We will continue IV magnesium replacement daily along with oral magnesium replacement therapy  MDD (major depressive disorder), recurrent severe, without psychosis ([place]) She has significant major depression but denies suicidal ideation She is on citalopram She is unmotivated to participate in activities of daily living Her thyroid function tests was within normal limits We discussed the importance of being active, otherwise I will not be able to prescribe treatment if her performance status continues to decline  in the next 2 weeks   No orders of the defined types were placed in this encounter.   INTERVAL HISTORY: Please see below for problem oriented charting. She returns with her daughter for further follow-up According to her daughter, she has not made any significant improvement since recent discharge from the hospital Her intake is poor and she has documented 10 pound weight loss since the last time she was seen here She is not getting up and participate in all activities of daily living. She spent more than 50% of the time resting or lying in bed She denies suicidal ideation She denies significant nausea or vomiting No constipation Her family has discontinued most of her sedating medications She denies pain  SUMMARY OF ONCOLOGIC HISTORY: Oncology History   Biothernostics: 96% probability cervical cancer  Additional evaluation revealed PTEN deletion, negative for MET amplification, PD-L1 expression of 60     Metastasis to liver ([place])   03/03/2017 Initial Diagnosis    Metastasis to liver ([place])    08/08/2017 - 11/02/2017 Chemotherapy    The patient had bevacizumab (AVASTIN) 1,800 mg in sodium chloride 0.9 % 100 mL chemo infusion, 15 mg/kg = 1,800 mg, Intravenous,  Once, 4 of 5 cycles Administration: 1,800 mg (08/09/2017), 1,800 mg (09/01/2017), 1,800 mg (09/22/2017), 1,800 mg (10/13/2017)  for chemotherapy treatment.     10/25/2017 -  Chemotherapy    The patient had pembrolizumab (KEYTRUDA) 200 mg in sodium chloride 0.9 % 50 mL chemo infusion, 200 mg, Intravenous, Once, 2 of 6 cycles Administration: 200 mg (10/31/2017), 200 mg (11/21/2017)  for chemotherapy treatment.      Cancer of unknown origin (Cindy Faulkner)   03/03/2017 Imaging    1. Multifocal liver metastasis. 2. Left posterior pelvic  mass extending into the obturator foramen measures up to 7 cm. Suspicious for malignancy. This should be easily amendable to percutaneous tissue sampling under image guidance. 3. No lytic bone lesions identified  of myeloma.    03/09/2017 Procedure    Technically successful ultrasound-guided core liver lesion biopsy    03/09/2017 Pathology Results    A. LIVER LESION, RIGHT LOBE; ULTRASOUND-GUIDED BIOPSY:  - METASTATIC SQUAMOUS CELL CARCINOMA, SEE COMMENT.   Comment: Metastatic carcinoma is immunoreactive for p40, p16, p53 and pancytokeratin while CK7, CK20, and TTF1 are negative. Significance of p16 positivity depends on the site of origin and the pattern of immunoreactivity in this material is non-specific. Possible sites of origin include gynecologic, anal, lung, and head/neck. Correlation with physical exam and additional imaging is required.     03/18/2017 Pathology Results    She had GYN exam and pap smear. Result is negative for malignancy    03/21/2017 - 04/01/2017 Hospital Admission    She was admitted to the hospital for pain management and had radiation therapy    03/23/2017 Procedure    Ultrasound and fluoroscopically guided right internal jugular single lumen power port catheter insertion. Tip in the SVC/RA junction. Catheter ready for use.    05/16/2017 Imaging    Mild decrease in size of left posterior pelvic soft tissue mass and liver metastases. No new or progressive metastatic disease identified.  Stable hepatic steatosis and tiny nonobstructing right renal calculus.    08/08/2017 Imaging    Decreased liver metastases.  Decreased size of left internal iliac mass or lymphadenopathy, which abuts the left sacrum.  No new or progressive metastatic disease.  Stable severe hepatic steatosis.    08/08/2017 - 11/02/2017 Chemotherapy    The patient had bevacizumab (AVASTIN) 1,800 mg in sodium chloride 0.9 % 100 mL chemo infusion, 15 mg/kg = 1,800 mg, Intravenous,  Once, 4 of 5 cycles Administration: 1,800 mg (08/09/2017), 1,800 mg (09/01/2017), 1,800 mg (09/22/2017), 1,800 mg (10/13/2017)  for chemotherapy treatment.     10/21/2017 Imaging    1. Interval progression of liver  metastases. 2. Poorly marginated left pelvic side wall soft tissue mass is mildly decreased in size. 3. Diffuse hepatic steatosis. 4. Aortic Atherosclerosis (ICD10-I70.0).    10/24/2017 Imaging    1. No acute intracranial process or metastatic disease. 2. Old basal ganglia lacunar infarcts and mild chronic small vessel ischemic changes. 3. Mild parenchymal brain volume loss.     10/25/2017 -  Chemotherapy    The patient had pembrolizumab (KEYTRUDA) 200 mg in sodium chloride 0.9 % 50 mL chemo infusion, 200 mg, Intravenous, Once, 2 of 6 cycles Administration: 200 mg (10/31/2017), 200 mg (11/21/2017)  for chemotherapy treatment.      Cervical cancer ([place])   07/20/2017 Initial Diagnosis    Cervical cancer ([place])    08/08/2017 - 11/02/2017 Chemotherapy    The patient had bevacizumab (AVASTIN) 1,800 mg in sodium chloride 0.9 % 100 mL chemo infusion, 15 mg/kg = 1,800 mg, Intravenous,  Once, 4 of 5 cycles Administration: 1,800 mg (08/09/2017), 1,800 mg (09/01/2017), 1,800 mg (09/22/2017), 1,800 mg (10/13/2017)  for chemotherapy treatment.     10/25/2017 -  Chemotherapy    The patient had pembrolizumab (KEYTRUDA) 200 mg in sodium chloride 0.9 % 50 mL chemo infusion, 200 mg, Intravenous, Once, 2 of 6 cycles Administration: 200 mg (10/31/2017), 200 mg (11/21/2017)  for chemotherapy treatment.      REVIEW OF SYSTEMS:   Constitutional: Denies fevers, chills or abnormal weight loss Eyes:  Denies blurriness of vision Ears, nose, mouth, throat, and face: Denies mucositis or sore throat Respiratory: Denies cough, dyspnea or wheezes Cardiovascular: Denies palpitation, chest discomfort or lower extremity swelling Gastrointestinal:  Denies nausea, heartburn or change in bowel habits Skin: Denies abnormal skin rashes Lymphatics: Denies new lymphadenopathy or easy bruising All other systems were reviewed with the patient and are negative.  I have reviewed the past medical history, past surgical history, social  history and family history with the patient and they are unchanged from previous note.  ALLERGIES:  has No Known Allergies.  MEDICATIONS:  Current Outpatient Medications  Medication Sig Dispense Refill  . atorvastatin (LIPITOR) 40 MG tablet Take 1 tablet (40 mg total) by mouth daily at 6 PM. 30 tablet 0  . cholecalciferol (VITAMIN D) 1000 units tablet Take 2,000 Units by mouth daily.    . citalopram (CELEXA) 40 MG tablet Take 20 mg by mouth daily.  1  . flecainide (TAMBOCOR) 50 MG tablet Take 1 tablet (50 mg total) by mouth daily. 90 tablet 3  . lidocaine-prilocaine (EMLA) cream APPLY 1 APPLICATION TO AFFECTED AREA(S) TOPICALLY AS NEEDED (Patient taking differently: Apply 1 application topically once. ) 30 g 9  . magnesium oxide (MAG-OX) 400 (241.3 Mg) MG tablet Take 1 tablet (400 mg total) by mouth 2 (two) times daily. 60 tablet 11  . metFORMIN (GLUCOPHAGE) 500 MG tablet Take 1 tablet (500 mg total) by mouth 2 (two) times daily. 60 tablet 11  . metoprolol tartrate (LOPRESSOR) 50 MG tablet Take 1 tablet (50 mg total) by mouth 2 (two) times daily. 90 tablet 3  . ondansetron (ZOFRAN) 8 MG tablet Take 1 tablet (8 mg total) by mouth every 8 (eight) hours as needed for nausea. 90 tablet 1  . polyethylene glycol (MIRALAX / GLYCOLAX) packet Take 17 g by mouth daily. (Patient not taking: Reported on 09/15/2017) 14 each 0  . protein supplement shake (PREMIER PROTEIN) LIQD Take 325 mLs (11 oz total) by mouth 2 (two) times daily between meals. 60 Can 9  . rivaroxaban (XARELTO) 20 MG TABS tablet Take 1 tablet (20 mg total) by mouth daily. 30 tablet 0   Current Facility-Administered Medications  Medication Dose Route Frequency Provider Last Rate Last Dose  . 0.9 %  sodium chloride infusion   Intravenous Once Alvy Bimler, Delbert Vu, MD        PHYSICAL EXAMINATION: ECOG PERFORMANCE STATUS: 3 - Symptomatic, >50% confined to bed  Vitals:   11/28/17 1426  BP: 106/78  Pulse: (!) 117  Resp: 18  Temp: 99 F (37.2  C)  SpO2: 98%   Filed Weights   11/28/17 1426  Weight: 224 lb 9.6 oz (101.9 kg)    GENERAL:alert, no distress and comfortable SKIN: skin color, texture, turgor are normal, no rashes or significant lesions EYES: normal, Conjunctiva are pink and non-injected, sclera clear OROPHARYNX:no exudate, no erythema and lips, buccal mucosa, and tongue normal  NECK: supple, thyroid normal size, non-tender, without nodularity LYMPH:  no palpable lymphadenopathy in the cervical, axillary or inguinal LUNGS: clear to auscultation and percussion with normal breathing effort HEART: regular rate & rhythm and no murmurs and no lower extremity edema ABDOMEN:abdomen soft, non-tender and normal bowel sounds Musculoskeletal:no cyanosis of digits and no clubbing  NEURO: alert & oriented x 3 with fluent speech, no focal motor/sensory deficits  LABORATORY DATA:  I have reviewed the data as listed    Component Value Date/Time   NA 129 (L) 11/28/2017 1511   K 3.3 (  L) 11/28/2017 1511   CL 91 (L) 11/28/2017 1511   CO2 26 11/28/2017 1511   GLUCOSE 166 (H) 11/28/2017 1511   BUN 8 11/28/2017 1511   CREATININE 0.65 11/28/2017 1511   CREATININE 0.81 06/28/2017 0851   CALCIUM 9.0 11/28/2017 1511   PROT 7.2 11/28/2017 1511   ALBUMIN 2.4 (L) 11/28/2017 1511   AST 67 (H) 11/28/2017 1511   AST 43 (H) 06/28/2017 0851   ALT 38 11/28/2017 1511   ALT 55 06/28/2017 0851   ALKPHOS 192 (H) 11/28/2017 1511   BILITOT 1.2 11/28/2017 1511   BILITOT 0.6 06/28/2017 0851   GFRNONAA >60 11/28/2017 1511   GFRNONAA >60 06/28/2017 0851   GFRAA >60 11/28/2017 1511   GFRAA >60 06/28/2017 0851    No results found for: SPEP, UPEP  Lab Results  Component Value Date   WBC 15.3 (H) 11/28/2017   NEUTROABS 12.5 (H) 11/28/2017   HGB 9.9 (L) 11/28/2017   HCT 31.2 (L) 11/28/2017   MCV 82.1 11/28/2017   PLT 147 11/28/2017      Chemistry      Component Value Date/Time   NA 129 (L) 11/28/2017 1511   K 3.3 (L) 11/28/2017 1511    CL 91 (L) 11/28/2017 1511   CO2 26 11/28/2017 1511   BUN 8 11/28/2017 1511   CREATININE 0.65 11/28/2017 1511   CREATININE 0.81 06/28/2017 0851      Component Value Date/Time   CALCIUM 9.0 11/28/2017 1511   ALKPHOS 192 (H) 11/28/2017 1511   AST 67 (H) 11/28/2017 1511   AST 43 (H) 06/28/2017 0851   ALT 38 11/28/2017 1511   ALT 55 06/28/2017 0851   BILITOT 1.2 11/28/2017 1511   BILITOT 0.6 06/28/2017 0851       RADIOGRAPHIC STUDIES: I have personally reviewed the radiological images as listed and agreed with the findings in the report. Dg Chest 2 View  Result Date: 11/15/2017 CLINICAL DATA:  Fever.  Cough and congestion. EXAM: CHEST - 2 VIEW COMPARISON:  06/16/2017. FINDINGS: PowerPort catheter with tip over superior vena cava. Heart size normal. Mild bibasilar atelectasis and/or scarring. No pleural effusion or pneumothorax. Degenerative change thoracic spine. IMPRESSION: 1.  PowerPort catheter with tip over superior vena cava. 2.  Mild bibasilar atelectasis and or scarring. Electronically Signed   By: Marcello Moores  Register   On: 11/15/2017 12:05    All questions were answered. The patient knows to call the clinic with any problems, questions or concerns. No barriers to learning was detected.  I spent 30 minutes counseling the patient face to face. The total time spent in the appointment was 40 minutes and more than 50% was on counseling and review of test results  [name], MD 11/29/2017 8:32 AM

## 2017-11-29 NOTE — Assessment & Plan Note (Signed)
She has profound physical deconditioning and weakness We are replacing magnesium with intravenous route and oral magnesium I will get physical therapy involved

## 2017-11-29 NOTE — Telephone Encounter (Signed)
Per Dr. Alvy Bimler, called to clarify how she is taking Metoprolol? She takes 50 mg BID, Rx sent to pharmacy. Given Magnesium results from yesterday of 1.4. She has not been taking the Magnesium oxide since she started having nausea. She will restart today taking the Magnesium. She is feeling better today.

## 2017-11-29 NOTE — Telephone Encounter (Signed)
Can you call to verify her metoprolol dose before refills? Also, her magnesium level is low. Is she taking magnesium oxide at least BID? If not, refill them as well

## 2017-11-30 ENCOUNTER — Telehealth: Payer: Self-pay

## 2017-11-30 DIAGNOSIS — Z7901 Long term (current) use of anticoagulants: Secondary | ICD-10-CM | POA: Diagnosis not present

## 2017-11-30 DIAGNOSIS — G893 Neoplasm related pain (acute) (chronic): Secondary | ICD-10-CM | POA: Diagnosis not present

## 2017-11-30 DIAGNOSIS — Z452 Encounter for adjustment and management of vascular access device: Secondary | ICD-10-CM | POA: Diagnosis not present

## 2017-11-30 DIAGNOSIS — E099 Drug or chemical induced diabetes mellitus without complications: Secondary | ICD-10-CM | POA: Diagnosis not present

## 2017-11-30 DIAGNOSIS — C801 Malignant (primary) neoplasm, unspecified: Secondary | ICD-10-CM | POA: Diagnosis not present

## 2017-11-30 DIAGNOSIS — G63 Polyneuropathy in diseases classified elsewhere: Secondary | ICD-10-CM | POA: Diagnosis not present

## 2017-11-30 DIAGNOSIS — C787 Secondary malignant neoplasm of liver and intrahepatic bile duct: Secondary | ICD-10-CM | POA: Diagnosis not present

## 2017-11-30 DIAGNOSIS — I480 Paroxysmal atrial fibrillation: Secondary | ICD-10-CM | POA: Diagnosis not present

## 2017-11-30 NOTE — Telephone Encounter (Signed)
Merry Proud, PT at [facility] called and left a message. Requesting verbal order from Dr. Alvy Bimler for PT once a week for 4 weeks.  Called back and gave verbal order for PT.

## 2017-12-01 ENCOUNTER — Other Ambulatory Visit: Payer: Self-pay | Admitting: *Deleted

## 2017-12-01 DIAGNOSIS — E099 Drug or chemical induced diabetes mellitus without complications: Secondary | ICD-10-CM | POA: Diagnosis not present

## 2017-12-01 DIAGNOSIS — C787 Secondary malignant neoplasm of liver and intrahepatic bile duct: Secondary | ICD-10-CM | POA: Diagnosis not present

## 2017-12-01 DIAGNOSIS — G63 Polyneuropathy in diseases classified elsewhere: Secondary | ICD-10-CM | POA: Diagnosis not present

## 2017-12-01 DIAGNOSIS — I480 Paroxysmal atrial fibrillation: Secondary | ICD-10-CM | POA: Diagnosis not present

## 2017-12-01 DIAGNOSIS — Z7901 Long term (current) use of anticoagulants: Secondary | ICD-10-CM | POA: Diagnosis not present

## 2017-12-01 DIAGNOSIS — G893 Neoplasm related pain (acute) (chronic): Secondary | ICD-10-CM | POA: Diagnosis not present

## 2017-12-01 DIAGNOSIS — Z452 Encounter for adjustment and management of vascular access device: Secondary | ICD-10-CM | POA: Diagnosis not present

## 2017-12-01 DIAGNOSIS — C801 Malignant (primary) neoplasm, unspecified: Secondary | ICD-10-CM | POA: Diagnosis not present

## 2017-12-01 NOTE — Patient Outreach (Signed)
[place] [facility]) Care Management  12/01/2017  Cindy Faulkner [facility] December 31, 1965 [id number]   Subjective: Received voicemail message from patient, states she is returning call, and requested call back. Telephone call to patient's home  / mobile number, no answer, left HIPAA compliant voicemail message, and requested call back.     Objective:Per KPN (Knowledge Performance Now, point of care tool) and chart review,patient hospitalized 11/15/17 -11/18/17 for Intractable nausea vomiting, MDD (major depressive disorder), recurrent severe, without psychosis,  Malnutrition of moderate degree.    Patient hospitalized 03/21/17 -04/01/17 for pain management,metastatic squamous cell carcinoma from unknown origin with metastases to sacrum,and liver region. Patient also has a history ofObstructive sleep apnea,Sacral mass,Metastasis to liver,Cancer of unknown origin,Metastasis to liver of unknown origin, radiation therapy, palliative care,Atrial fibrillation, and cardiovascular accident (CVA). [facility] Care Management completed last transition of care follow up on 04/18/17.      Assessment: Received [facility] Transition of care referral on 11/16/17. Transition of care follow up pending patient contact.      Plan:RNCM has sent unsuccessful outreach  letter, [facility] pamphlet, will call patient for 3rd telephone outreach attempt, transition of care follow up, and proceed with case closure, within 10 business days if no return call.       Cindy Faulkner, BSN, [place] Management [facility] Telephonic CM Phone: [phone] Fax: [phone]

## 2017-12-02 ENCOUNTER — Other Ambulatory Visit: Payer: Self-pay | Admitting: *Deleted

## 2017-12-02 DIAGNOSIS — C801 Malignant (primary) neoplasm, unspecified: Secondary | ICD-10-CM | POA: Diagnosis not present

## 2017-12-02 DIAGNOSIS — Z79899 Other long term (current) drug therapy: Secondary | ICD-10-CM | POA: Diagnosis not present

## 2017-12-02 DIAGNOSIS — Z7984 Long term (current) use of oral hypoglycemic drugs: Secondary | ICD-10-CM | POA: Diagnosis not present

## 2017-12-02 NOTE — Patient Outreach (Signed)
[place] [facility]) Care Management  12/02/2017  Cindy Faulkner [facility] 1965-03-09 [id number]    Subjective: Telephone call to patient's home  / mobile number, no answer, left HIPAA compliant voicemail message, and requested call back.     Objective:Per KPN (Knowledge Performance Now, point of care tool) and chart review,patient hospitalized 11/15/17 -11/18/17 forIntractable nausea vomiting,MDD (major depressive disorder), recurrent severe, without psychosis,Malnutrition of moderate degree. Patient hospitalized 03/21/17 -04/01/17 for pain management,metastatic squamous cell carcinoma from unknown origin with metastases to sacrum,and liver region. Patient also has a history ofObstructive sleep apnea,Sacral mass,Metastasis to liver,Cancer of unknown origin,Metastasis to liver of unknown origin, radiation therapy, palliative care,Atrial fibrillation, and cardiovascular accident (CVA). [facility] Care Management completed last transition of care follow up on 04/18/17.    Assessment: Received [facility] Transition of care referral on9/25/19. Transition of care follow up pending patient contact.     Plan:RNCM has sent unsuccessful outreach letter, [facility] pamphlet, and will proceed with case closure, within 10 business days if no return call.       Mell Guia H. Annia Friendly, BSN, [place] Management [facility] Telephonic CM Phone: [phone] Fax: [phone]

## 2017-12-02 NOTE — Progress Notes (Signed)
Disability successfully faxed to [employer] at [phone]. Mailed copy to patient address on file. 

## 2017-12-05 DIAGNOSIS — Z7984 Long term (current) use of oral hypoglycemic drugs: Secondary | ICD-10-CM | POA: Diagnosis not present

## 2017-12-05 DIAGNOSIS — Z79899 Other long term (current) drug therapy: Secondary | ICD-10-CM | POA: Diagnosis not present

## 2017-12-05 DIAGNOSIS — R112 Nausea with vomiting, unspecified: Secondary | ICD-10-CM | POA: Diagnosis not present

## 2017-12-05 DIAGNOSIS — C539 Malignant neoplasm of cervix uteri, unspecified: Secondary | ICD-10-CM | POA: Diagnosis not present

## 2017-12-05 DIAGNOSIS — C787 Secondary malignant neoplasm of liver and intrahepatic bile duct: Secondary | ICD-10-CM | POA: Diagnosis not present

## 2017-12-05 DIAGNOSIS — E440 Moderate protein-calorie malnutrition: Secondary | ICD-10-CM | POA: Diagnosis not present

## 2017-12-05 DIAGNOSIS — C801 Malignant (primary) neoplasm, unspecified: Secondary | ICD-10-CM | POA: Diagnosis not present

## 2017-12-06 DIAGNOSIS — Z7984 Long term (current) use of oral hypoglycemic drugs: Secondary | ICD-10-CM | POA: Diagnosis not present

## 2017-12-06 DIAGNOSIS — Z79899 Other long term (current) drug therapy: Secondary | ICD-10-CM | POA: Diagnosis not present

## 2017-12-06 DIAGNOSIS — C801 Malignant (primary) neoplasm, unspecified: Secondary | ICD-10-CM | POA: Diagnosis not present

## 2017-12-08 ENCOUNTER — Telehealth: Payer: Self-pay

## 2017-12-08 DIAGNOSIS — G63 Polyneuropathy in diseases classified elsewhere: Secondary | ICD-10-CM | POA: Diagnosis not present

## 2017-12-08 DIAGNOSIS — I480 Paroxysmal atrial fibrillation: Secondary | ICD-10-CM | POA: Diagnosis not present

## 2017-12-08 DIAGNOSIS — E099 Drug or chemical induced diabetes mellitus without complications: Secondary | ICD-10-CM | POA: Diagnosis not present

## 2017-12-08 DIAGNOSIS — C801 Malignant (primary) neoplasm, unspecified: Secondary | ICD-10-CM | POA: Diagnosis not present

## 2017-12-08 DIAGNOSIS — Z7901 Long term (current) use of anticoagulants: Secondary | ICD-10-CM | POA: Diagnosis not present

## 2017-12-08 DIAGNOSIS — Z452 Encounter for adjustment and management of vascular access device: Secondary | ICD-10-CM | POA: Diagnosis not present

## 2017-12-08 DIAGNOSIS — C787 Secondary malignant neoplasm of liver and intrahepatic bile duct: Secondary | ICD-10-CM | POA: Diagnosis not present

## 2017-12-08 DIAGNOSIS — G893 Neoplasm related pain (acute) (chronic): Secondary | ICD-10-CM | POA: Diagnosis not present

## 2017-12-08 NOTE — Telephone Encounter (Signed)
Merry Proud with [facility] called and left a message. Requesting verbal order for speech therapy consult to assist with over coming her aversion to swallowing and assist with eating.  Called back and verbal order given to [place] with [facility].

## 2017-12-09 ENCOUNTER — Other Ambulatory Visit: Payer: Self-pay | Admitting: *Deleted

## 2017-12-09 ENCOUNTER — Telehealth: Payer: Self-pay

## 2017-12-09 ENCOUNTER — Encounter: Payer: Self-pay | Admitting: Physician Assistant

## 2017-12-09 DIAGNOSIS — G893 Neoplasm related pain (acute) (chronic): Secondary | ICD-10-CM | POA: Diagnosis not present

## 2017-12-09 DIAGNOSIS — E099 Drug or chemical induced diabetes mellitus without complications: Secondary | ICD-10-CM | POA: Diagnosis not present

## 2017-12-09 DIAGNOSIS — Z7901 Long term (current) use of anticoagulants: Secondary | ICD-10-CM | POA: Diagnosis not present

## 2017-12-09 DIAGNOSIS — C801 Malignant (primary) neoplasm, unspecified: Secondary | ICD-10-CM | POA: Diagnosis not present

## 2017-12-09 DIAGNOSIS — Z452 Encounter for adjustment and management of vascular access device: Secondary | ICD-10-CM | POA: Diagnosis not present

## 2017-12-09 DIAGNOSIS — C787 Secondary malignant neoplasm of liver and intrahepatic bile duct: Secondary | ICD-10-CM | POA: Diagnosis not present

## 2017-12-09 DIAGNOSIS — I480 Paroxysmal atrial fibrillation: Secondary | ICD-10-CM | POA: Diagnosis not present

## 2017-12-09 DIAGNOSIS — G63 Polyneuropathy in diseases classified elsewhere: Secondary | ICD-10-CM | POA: Diagnosis not present

## 2017-12-09 NOTE — Telephone Encounter (Signed)
Verbal order given for ST with [facility] to re evaluate pt in 2 weeks to see how she is doing with instructions / education given during [facility] ST consult.

## 2017-12-09 NOTE — Patient Outreach (Signed)
[place] [facility]) Care Management  12/09/2017  Jermeka [facility] 10/27/1965 [id number]   No response from patient outreach attempts will proceed with case closure.    Objective:Per KPN (Knowledge Performance Now, point of care tool) and chart review,patient hospitalized 11/15/17 -11/18/17 forIntractable nausea vomiting,MDD (major depressive disorder), recurrent severe, without psychosis,Malnutrition of moderate degree. Patient hospitalized 03/21/17 -04/01/17 for pain management,metastatic squamous cell carcinoma from unknown origin with metastases to sacrum,and liver region. Patient also has a history ofObstructive sleep apnea,Sacral mass,Metastasis to liver,Cancer of unknown origin,Metastasis to liver of unknown origin, radiation therapy, palliative care,Atrial fibrillation, and cardiovascular accident (CVA). [facility] Care Management completed last transition of care follow up on 04/18/17.    Assessment: Received [facility] Transition of care referral on9/25/19. Transition of care follow up not completed due to unable to contact patient and will proceed with case closure.      Plan:Case closure due to unable to reach.       [place] Craine H. Annia Friendly, BSN, [place] Management [facility] Telephonic CM Phone: [phone] Fax: [phone]

## 2017-12-12 ENCOUNTER — Inpatient Hospital Stay: Payer: 59

## 2017-12-12 ENCOUNTER — Telehealth: Payer: Self-pay | Admitting: Hematology and Oncology

## 2017-12-12 ENCOUNTER — Inpatient Hospital Stay (HOSPITAL_BASED_OUTPATIENT_CLINIC_OR_DEPARTMENT_OTHER): Payer: 59 | Admitting: Hematology and Oncology

## 2017-12-12 ENCOUNTER — Telehealth: Payer: Self-pay

## 2017-12-12 VITALS — HR 100

## 2017-12-12 DIAGNOSIS — C787 Secondary malignant neoplasm of liver and intrahepatic bile duct: Secondary | ICD-10-CM

## 2017-12-12 DIAGNOSIS — D6481 Anemia due to antineoplastic chemotherapy: Secondary | ICD-10-CM | POA: Diagnosis not present

## 2017-12-12 DIAGNOSIS — C801 Malignant (primary) neoplasm, unspecified: Secondary | ICD-10-CM

## 2017-12-12 DIAGNOSIS — Z452 Encounter for adjustment and management of vascular access device: Secondary | ICD-10-CM | POA: Diagnosis not present

## 2017-12-12 DIAGNOSIS — C539 Malignant neoplasm of cervix uteri, unspecified: Secondary | ICD-10-CM

## 2017-12-12 DIAGNOSIS — T451X5A Adverse effect of antineoplastic and immunosuppressive drugs, initial encounter: Secondary | ICD-10-CM

## 2017-12-12 DIAGNOSIS — Z79899 Other long term (current) drug therapy: Secondary | ICD-10-CM | POA: Diagnosis not present

## 2017-12-12 DIAGNOSIS — Z95828 Presence of other vascular implants and grafts: Secondary | ICD-10-CM

## 2017-12-12 DIAGNOSIS — F332 Major depressive disorder, recurrent severe without psychotic features: Secondary | ICD-10-CM

## 2017-12-12 DIAGNOSIS — E099 Drug or chemical induced diabetes mellitus without complications: Secondary | ICD-10-CM

## 2017-12-12 DIAGNOSIS — G893 Neoplasm related pain (acute) (chronic): Secondary | ICD-10-CM

## 2017-12-12 DIAGNOSIS — E871 Hypo-osmolality and hyponatremia: Secondary | ICD-10-CM

## 2017-12-12 DIAGNOSIS — I6330 Cerebral infarction due to thrombosis of unspecified cerebral artery: Secondary | ICD-10-CM

## 2017-12-12 DIAGNOSIS — E43 Unspecified severe protein-calorie malnutrition: Secondary | ICD-10-CM

## 2017-12-12 DIAGNOSIS — Z7984 Long term (current) use of oral hypoglycemic drugs: Secondary | ICD-10-CM

## 2017-12-12 DIAGNOSIS — E876 Hypokalemia: Secondary | ICD-10-CM

## 2017-12-12 DIAGNOSIS — E1140 Type 2 diabetes mellitus with diabetic neuropathy, unspecified: Secondary | ICD-10-CM

## 2017-12-12 DIAGNOSIS — Z7901 Long term (current) use of anticoagulants: Secondary | ICD-10-CM

## 2017-12-12 DIAGNOSIS — Z5112 Encounter for antineoplastic immunotherapy: Secondary | ICD-10-CM | POA: Diagnosis not present

## 2017-12-12 DIAGNOSIS — T380X5A Adverse effect of glucocorticoids and synthetic analogues, initial encounter: Secondary | ICD-10-CM

## 2017-12-12 LAB — COMPREHENSIVE METABOLIC PANEL
ALBUMIN: 2.1 g/dL — AB (ref 3.5–5.0)
ALT: 17 U/L (ref 0–44)
ANION GAP: 12 (ref 5–15)
AST: 37 U/L (ref 15–41)
Alkaline Phosphatase: 190 U/L — ABNORMAL HIGH (ref 38–126)
BUN: 5 mg/dL — ABNORMAL LOW (ref 6–20)
CO2: 27 mmol/L (ref 22–32)
Calcium: 8.9 mg/dL (ref 8.9–10.3)
Chloride: 87 mmol/L — ABNORMAL LOW (ref 98–111)
Creatinine, Ser: 0.6 mg/dL (ref 0.44–1.00)
GFR calc Af Amer: >60 mL/min (ref >60)
GFR calc non Af Amer: >60 mL/min (ref >60)
Glucose, Bld: 134 mg/dL — ABNORMAL HIGH (ref 70–99)
POTASSIUM: 3.6 mmol/L (ref 3.5–5.1)
SODIUM: 126 mmol/L — AB (ref 135–145)
TOTAL PROTEIN: 6.9 g/dL (ref 6.5–8.1)
Total Bilirubin: 1.1 mg/dL (ref 0.3–1.2)

## 2017-12-12 LAB — CBC WITH DIFFERENTIAL/PLATELET
Abs Immature Granulocytes: 0.1 10*3/uL — ABNORMAL HIGH (ref 0.00–0.07)
BASOS ABS: 0 10*3/uL (ref 0.0–0.1)
BASOS PCT: 0 %
EOS ABS: 0 10*3/uL (ref 0.0–0.5)
EOS PCT: 0 %
HCT: 30.3 % — ABNORMAL LOW (ref 36.0–46.0)
Hemoglobin: 9.1 g/dL — ABNORMAL LOW (ref 12.0–15.0)
Immature Granulocytes: 1 %
Lymphocytes Relative: 10 %
Lymphs Abs: 2 10*3/uL (ref 0.7–4.0)
MCH: 25.3 pg — ABNORMAL LOW (ref 26.0–34.0)
MCHC: 30 g/dL (ref 30.0–36.0)
MCV: 84.2 fL (ref 80.0–100.0)
Monocytes Absolute: 1.6 10*3/uL — ABNORMAL HIGH (ref 0.1–1.0)
Monocytes Relative: 8 %
NRBC: 0 % (ref 0.0–0.2)
Neutro Abs: 16.4 10*3/uL — ABNORMAL HIGH (ref 1.7–7.7)
Neutrophils Relative %: 81 %
PLATELETS: 103 10*3/uL — AB (ref 150–400)
RBC: 3.6 MIL/uL — AB (ref 3.87–5.11)
RDW: 19.4 % — AB (ref 11.5–15.5)
WBC: 20.2 10*3/uL — AB (ref 4.0–10.5)

## 2017-12-12 LAB — MAGNESIUM: Magnesium: 1.8 mg/dL (ref 1.7–2.4)

## 2017-12-12 LAB — TSH: TSH: 0.828 u[IU]/mL (ref 0.308–3.960)

## 2017-12-12 MED ORDER — SODIUM CHLORIDE 0.9% FLUSH
10.0000 mL | Freq: Once | INTRAVENOUS | Status: AC
  Administered 2017-12-12: 10 mL
  Filled 2017-12-12: qty 10

## 2017-12-12 MED ORDER — SODIUM CHLORIDE 0.9 % IV SOLN
Freq: Once | INTRAVENOUS | Status: AC
  Administered 2017-12-12: 16:00:00 via INTRAVENOUS
  Filled 2017-12-12: qty 250

## 2017-12-12 MED ORDER — HEPARIN SOD (PORK) LOCK FLUSH 100 UNIT/ML IV SOLN
500.0000 [IU] | Freq: Once | INTRAVENOUS | Status: AC | PRN
  Administered 2017-12-12: 500 [IU]
  Filled 2017-12-12: qty 5

## 2017-12-12 MED ORDER — SODIUM CHLORIDE 0.9% FLUSH
10.0000 mL | INTRAVENOUS | Status: DC | PRN
  Administered 2017-12-12: 10 mL
  Filled 2017-12-12: qty 10

## 2017-12-12 MED ORDER — DRONABINOL 2.5 MG PO CAPS: 2.5000 mg | ORAL_CAPSULE | Freq: Two times a day (BID) | ORAL | 1 refills | Status: DC

## 2017-12-12 MED ORDER — SODIUM CHLORIDE 0.9 % IV SOLN
200.0000 mg | Freq: Once | INTRAVENOUS | Status: AC
  Administered 2017-12-12: 200 mg via INTRAVENOUS
  Filled 2017-12-12: qty 8

## 2017-12-12 MED FILL — DRONABINOL 2.5 MG CAPSULE: 2.5 | 30 days supply | Qty: 60 | Fill #0

## 2017-12-12 NOTE — Patient Instructions (Signed)
[facility] Discharge Instructions for Patients Receiving Chemotherapy  Today you received the following chemotherapy agents: Pembrolizumab (Keytruda)  To help prevent nausea and vomiting after your treatment, we encourage you to take your nausea medication as directed.    If you develop nausea and vomiting that is not controlled by your nausea medication, call the clinic.   BELOW ARE SYMPTOMS THAT SHOULD BE REPORTED IMMEDIATELY:  *FEVER GREATER THAN 100.5 F  *CHILLS WITH OR WITHOUT FEVER  NAUSEA AND VOMITING THAT IS NOT CONTROLLED WITH YOUR NAUSEA MEDICATION  *UNUSUAL SHORTNESS OF BREATH  *UNUSUAL BRUISING OR BLEEDING  TENDERNESS IN MOUTH AND THROAT WITH OR WITHOUT PRESENCE OF ULCERS  *URINARY PROBLEMS  *BOWEL PROBLEMS  UNUSUAL RASH Items with * indicate a potential emergency and should be followed up as soon as possible.  Feel free to call the clinic should you have any questions or concerns. The clinic phone number is [phone].  Please show the CHEMO ALERT CARD at check-in to the Emergency Department and triage nurse. 

## 2017-12-12 NOTE — Telephone Encounter (Signed)
Called Cindy Faulkner and left message, per Dr. Alvy Bimler for the next 3 days Cindy Faulkner will not get any IV fluids.

## 2017-12-12 NOTE — Telephone Encounter (Signed)
Gave avs and calendar ° °

## 2017-12-13 ENCOUNTER — Encounter: Payer: Self-pay | Admitting: Hematology and Oncology

## 2017-12-13 DIAGNOSIS — C801 Malignant (primary) neoplasm, unspecified: Secondary | ICD-10-CM | POA: Diagnosis not present

## 2017-12-13 DIAGNOSIS — E43 Unspecified severe protein-calorie malnutrition: Secondary | ICD-10-CM | POA: Insufficient documentation

## 2017-12-13 DIAGNOSIS — Z7984 Long term (current) use of oral hypoglycemic drugs: Secondary | ICD-10-CM | POA: Diagnosis not present

## 2017-12-13 DIAGNOSIS — Z79899 Other long term (current) drug therapy: Secondary | ICD-10-CM | POA: Diagnosis not present

## 2017-12-13 NOTE — Assessment & Plan Note (Signed)
According to family members, she seems to participate more with family activities with less depression She has appointment to see psychiatrist next month for further follow-up

## 2017-12-13 NOTE — Assessment & Plan Note (Signed)
She has severe protein calorie malnutrition with very selective diet that are typically sweet.  Unfortunately, this is the only type of food that appeals the patient.  She is noted to have significant hyponatremia which I think is diet related We have extensive discussion about dietary change to manage hyponatremia. I recommend discontinuation of IV fluids and to focus on oral fluid intake if possible

## 2017-12-13 NOTE — Assessment & Plan Note (Signed)
Recent liver enzymes were within normal limits.  Monitor closely

## 2017-12-13 NOTE — Assessment & Plan Note (Signed)
This is likely anemia of chronic disease. The patient denies recent history of bleeding such as epistaxis, hematuria or hematochezia. She is asymptomatic from the anemia. We will observe for now.  

## 2017-12-13 NOTE — Assessment & Plan Note (Signed)
She will continue oral magnesium supplement 

## 2017-12-13 NOTE — Progress Notes (Signed)
[facility] OFF[facility] PROGRESS NOTE  Patient Care Team: Cleda Mccreedy as PCP - General (Nurse Practitioner) Arvella Nigh, MD as Obstetrician (Obstetrics and Gynecology) Evans Lance, MD as Consulting Physician (Cardiology)  ASSESSMENT & PLAN:  Cancer of unknown origin [facility]) Overall, her performance status is improved with aggressive family intervention, home physical therapy, advanced home care services with IV fluids hydration therapy and social worker visits We will proceed with treatment as scheduled.  After this, she will get repeat imaging study next month for objective response to therapy   Metastasis to liver [facility]) Recent liver enzymes were within normal limits.  Monitor closely  Severe protein-calorie malnutrition ([place]) She has severe protein calorie malnutrition with very selective diet that are typically sweet.  Unfortunately, this is the only type of food that appeals the patient.  She is noted to have significant hyponatremia which I think is diet related We have extensive discussion about dietary change to manage hyponatremia. I recommend discontinuation of IV fluids and to focus on oral fluid intake if possible  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([place]) She has significant poorly controlled diabetes at home due to her preference for sweet foods such as peaches and yogurt She will continue metformin for now  Hypomagnesemia She will continue oral magnesium supplement.  MDD (major depressive disorder), recurrent severe, without psychosis ([place]) According to family members, she seems to participate more with family activities with less depression She has appointment to see psychiatrist next month for further follow-up  Anemia due to antineoplastic chemotherapy This is likely anemia of chronic disease. The patient denies recent history of bleeding such as epistaxis, hematuria or hematochezia. She is asymptomatic from the anemia. We will observe for now.     No  orders of the defined types were placed in this encounter.   INTERVAL HISTORY: Please see below for problem oriented charting. She returns with her daughters and her husband for further follow-up Her family is taking time off to take care of her at home.  Since the last time I saw her, she is improving She is eating more but her food choices are somewhat restricted to yogurt and peaches She is getting IV fluids on a regular basis. She is no longer taking pain medicine although she still have some residual neuropathy She is more alert and engage in activities of daily living She is participating with physical therapy and rehab Family members noted that her depression has improved She denies recent chest pain or shortness of breath She still have intermittent episodes of excessive fatigue and reduced mental alertness.  When that happens, family members will check her blood sugar and blood pressure.  What they have noticed is those episodes correlate with severe hyperglycemia or low blood pressure Family members have stopped some of her blood pressure medications and that has improve her mental status. She denies nausea or constipation.  SUMMARY OF ONCOLOGIC HISTORY: Oncology History   Biothernostics: 96% probability cervical cancer  Additional evaluation revealed PTEN deletion, negative for MET amplification, PD-L1 expression of 60     Metastasis to liver ([place])   03/03/2017 Initial Diagnosis    Metastasis to liver ([place])    08/08/2017 - 11/02/2017 Chemotherapy    The patient had bevacizumab (AVASTIN) 1,800 mg in sodium chloride 0.9 % 100 mL chemo infusion, 15 mg/kg = 1,800 mg, Intravenous,  Once, 4 of 5 cycles Administration: 1,800 mg (08/09/2017), 1,800 mg (09/01/2017), 1,800 mg (09/22/2017), 1,800 mg (10/13/2017)  for chemotherapy treatment.  10/25/2017 -  Chemotherapy    The patient had pembrolizumab (KEYTRUDA) 200 mg in sodium chloride 0.9 % 50 mL chemo infusion, 200 mg, Intravenous,  Once, 3 of 6 cycles Administration: 200 mg (10/31/2017), 200 mg (11/21/2017), 200 mg (12/12/2017)  for chemotherapy treatment.      Cancer of unknown origin ([place])   03/03/2017 Imaging    1. Multifocal liver metastasis. 2. Left posterior pelvic mass extending into the obturator foramen measures up to 7 cm. Suspicious for malignancy. This should be easily amendable to percutaneous tissue sampling under image guidance. 3. No lytic bone lesions identified of myeloma.    03/09/2017 Procedure    Technically successful ultrasound-guided core liver lesion biopsy    03/09/2017 Pathology Results    A. LIVER LESION, RIGHT LOBE; ULTRASOUND-GUIDED BIOPSY:  - METASTATIC SQUAMOUS CELL CARCINOMA, SEE COMMENT.   Comment: Metastatic carcinoma is immunoreactive for p40, p16, p53 and pancytokeratin while CK7, CK20, and TTF1 are negative. Significance of p16 positivity depends on the site of origin and the pattern of immunoreactivity in this material is non-specific. Possible sites of origin include gynecologic, anal, lung, and head/neck. Correlation with physical exam and additional imaging is required.     03/18/2017 Pathology Results    She had GYN exam and pap smear. Result is negative for malignancy    03/21/2017 - 04/01/2017 Hospital Admission    She was admitted to the hospital for pain management and had radiation therapy    03/23/2017 Procedure    Ultrasound and fluoroscopically guided right internal jugular single lumen power port catheter insertion. Tip in the SVC/RA junction. Catheter ready for use.    05/16/2017 Imaging    Mild decrease in size of left posterior pelvic soft tissue mass and liver metastases. No new or progressive metastatic disease identified.  Stable hepatic steatosis and tiny nonobstructing right renal calculus.    08/08/2017 Imaging    Decreased liver metastases.  Decreased size of left internal iliac mass or lymphadenopathy, which abuts the left sacrum.  No new or  progressive metastatic disease.  Stable severe hepatic steatosis.    08/08/2017 - 11/02/2017 Chemotherapy    The patient had bevacizumab (AVASTIN) 1,800 mg in sodium chloride 0.9 % 100 mL chemo infusion, 15 mg/kg = 1,800 mg, Intravenous,  Once, 4 of 5 cycles Administration: 1,800 mg (08/09/2017), 1,800 mg (09/01/2017), 1,800 mg (09/22/2017), 1,800 mg (10/13/2017)  for chemotherapy treatment.     10/21/2017 Imaging    1. Interval progression of liver metastases. 2. Poorly marginated left pelvic side wall soft tissue mass is mildly decreased in size. 3. Diffuse hepatic steatosis. 4. Aortic Atherosclerosis (ICD10-I70.0).    10/24/2017 Imaging    1. No acute intracranial process or metastatic disease. 2. Old basal ganglia lacunar infarcts and mild chronic small vessel ischemic changes. 3. Mild parenchymal brain volume loss.     10/25/2017 -  Chemotherapy    The patient had pembrolizumab (KEYTRUDA) 200 mg in sodium chloride 0.9 % 50 mL chemo infusion, 200 mg, Intravenous, Once, 3 of 6 cycles Administration: 200 mg (10/31/2017), 200 mg (11/21/2017), 200 mg (12/12/2017)  for chemotherapy treatment.      Cervical cancer ([place])   07/20/2017 Initial Diagnosis    Cervical cancer ([place])    08/08/2017 - 11/02/2017 Chemotherapy    The patient had bevacizumab (AVASTIN) 1,800 mg in sodium chloride 0.9 % 100 mL chemo infusion, 15 mg/kg = 1,800 mg, Intravenous,  Once, 4 of 5 cycles Administration: 1,800 mg (08/09/2017), 1,800 mg (09/01/2017), 1,800 mg (  09/22/2017), 1,800 mg (10/13/2017)  for chemotherapy treatment.     10/25/2017 -  Chemotherapy    The patient had pembrolizumab (KEYTRUDA) 200 mg in sodium chloride 0.9 % 50 mL chemo infusion, 200 mg, Intravenous, Once, 3 of 6 cycles Administration: 200 mg (10/31/2017), 200 mg (11/21/2017), 200 mg (12/12/2017)  for chemotherapy treatment.      REVIEW OF SYSTEMS:   Constitutional: Denies fevers, chills or abnormal weight loss Eyes: Denies blurriness of vision Ears,  nose, mouth, throat, and face: Denies mucositis or sore throat Respiratory: Denies cough, dyspnea or wheezes Cardiovascular: Denies palpitation, chest discomfort or lower extremity swelling Gastrointestinal:  Denies nausea, heartburn or change in bowel habits Skin: Denies abnormal skin rashes Lymphatics: Denies new lymphadenopathy or easy bruising Behavioral/Psych: Mood is stable, no new changes  All other systems were reviewed with the patient and are negative.  I have reviewed the past medical history, past surgical history, social history and family history with the patient and they are unchanged from previous note.  ALLERGIES:  has No Known Allergies.  MEDICATIONS:  Current Outpatient Medications  Medication Sig Dispense Refill  . atorvastatin (LIPITOR) 40 MG tablet Take 1 tablet (40 mg total) by mouth daily at 6 PM. 30 tablet 0  . cholecalciferol (VITAMIN D) 1000 units tablet Take 2,000 Units by mouth daily.    . citalopram (CELEXA) 40 MG tablet Take 20 mg by mouth daily.  1  . dronabinol (MARINOL) 2.5 MG capsule Take 1 capsule (2.5 mg total) by mouth 2 (two) times daily before a meal. 60 capsule 1  . flecainide (TAMBOCOR) 50 MG tablet Take 1 tablet (50 mg total) by mouth daily. Please make overdue appt with Dr. Lovena Le before anymore refills. 1st attempt 30 tablet 0  . lidocaine-prilocaine (EMLA) cream APPLY 1 APPLICATION TO AFFECTED AREA(S) TOPICALLY AS NEEDED (Patient taking differently: Apply 1 application topically once. ) 30 g 9  . magnesium oxide (MAG-OX) 400 (241.3 Mg) MG tablet Take 1 tablet (400 mg total) by mouth 2 (two) times daily. 60 tablet 11  . metFORMIN (GLUCOPHAGE) 500 MG tablet Take 1 tablet (500 mg total) by mouth 2 (two) times daily. 60 tablet 11  . metoprolol tartrate (LOPRESSOR) 50 MG tablet Take 1 tablet (50 mg total) by mouth 2 (two) times daily. 90 tablet 3  . ondansetron (ZOFRAN) 8 MG tablet Take 1 tablet (8 mg total) by mouth every 8 (eight) hours as needed  for nausea. 90 tablet 1  . polyethylene glycol (MIRALAX / GLYCOLAX) packet Take 17 g by mouth daily. (Patient not taking: Reported on 09/15/2017) 14 each 0  . protein supplement shake (PREMIER PROTEIN) LIQD Take 325 mLs (11 oz total) by mouth 2 (two) times daily between meals. 60 Can 9  . rivaroxaban (XARELTO) 20 MG TABS tablet Take 1 tablet (20 mg total) by mouth daily. 30 tablet 0   No current facility-administered medications for this visit.     PHYSICAL EXAMINATION: ECOG PERFORMANCE STATUS: 2 - Symptomatic, <50% confined to bed  Vitals:   12/12/17 1411  BP: (!) 144/94  Pulse: (!) 111  Resp: 18  Temp: 98.2 F (36.8 C)  SpO2: 100%   Filed Weights   12/12/17 1411  Weight: 226 lb 3.2 oz (102.6 kg)    GENERAL:alert, no distress and comfortable SKIN: skin color, texture, turgor are normal, no rashes or significant lesions EYES: normal, Conjunctiva are pink and non-injected, sclera clear OROPHARYNX:no exudate, no erythema and lips, buccal mucosa, and tongue normal  NECK: supple, thyroid normal size, non-tender, without nodularity LYMPH:  no palpable lymphadenopathy in the cervical, axillary or inguinal LUNGS: clear to auscultation and percussion with normal breathing effort HEART: regular rate & rhythm and no murmurs and no lower extremity edema ABDOMEN:abdomen soft, non-tender and normal bowel sounds Musculoskeletal:no cyanosis of digits and no clubbing  NEURO: alert & oriented x 3 with fluent speech, no focal motor/sensory deficits  LABORATORY DATA:  I have reviewed the data as listed    Component Value Date/Time   NA 126 (L) 12/12/2017 1337   K 3.6 12/12/2017 1337   CL 87 (L) 12/12/2017 1337   CO2 27 12/12/2017 1337   GLUCOSE 134 (H) 12/12/2017 1337   BUN 5 (L) 12/12/2017 1337   CREATININE 0.60 12/12/2017 1337   CREATININE 0.81 06/28/2017 0851   CALCIUM 8.9 12/12/2017 1337   PROT 6.9 12/12/2017 1337   ALBUMIN 2.1 (L) 12/12/2017 1337   AST 37 12/12/2017 1337   AST  43 (H) 06/28/2017 0851   ALT 17 12/12/2017 1337   ALT 55 06/28/2017 0851   ALKPHOS 190 (H) 12/12/2017 1337   BILITOT 1.1 12/12/2017 1337   BILITOT 0.6 06/28/2017 0851   GFRNONAA >60 12/12/2017 1337   GFRNONAA >60 06/28/2017 0851   GFRAA >60 12/12/2017 1337   GFRAA >60 06/28/2017 0851    No results found for: SPEP, UPEP  Lab Results  Component Value Date   WBC 20.2 (H) 12/12/2017   NEUTROABS 16.4 (H) 12/12/2017   HGB 9.1 (L) 12/12/2017   HCT 30.3 (L) 12/12/2017   MCV 84.2 12/12/2017   PLT 103 (L) 12/12/2017      Chemistry      Component Value Date/Time   NA 126 (L) 12/12/2017 1337   K 3.6 12/12/2017 1337   CL 87 (L) 12/12/2017 1337   CO2 27 12/12/2017 1337   BUN 5 (L) 12/12/2017 1337   CREATININE 0.60 12/12/2017 1337   CREATININE 0.81 06/28/2017 0851      Component Value Date/Time   CALCIUM 8.9 12/12/2017 1337   ALKPHOS 190 (H) 12/12/2017 1337   AST 37 12/12/2017 1337   AST 43 (H) 06/28/2017 0851   ALT 17 12/12/2017 1337   ALT 55 06/28/2017 0851   BILITOT 1.1 12/12/2017 1337   BILITOT 0.6 06/28/2017 0851       RADIOGRAPHIC STUDIES: I have personally reviewed the radiological images as listed and agreed with the findings in the report. Dg Chest 2 View  Result Date: 11/15/2017 CLINICAL DATA:  Fever.  Cough and congestion. EXAM: CHEST - 2 VIEW COMPARISON:  06/16/2017. FINDINGS: PowerPort catheter with tip over superior vena cava. Heart size normal. Mild bibasilar atelectasis and/or scarring. No pleural effusion or pneumothorax. Degenerative change thoracic spine. IMPRESSION: 1.  PowerPort catheter with tip over superior vena cava. 2.  Mild bibasilar atelectasis and or scarring. Electronically Signed   By: Marcello Moores  Register   On: 11/15/2017 12:05    All questions were answered. The patient knows to call the clinic with any problems, questions or concerns. No barriers to learning was detected.  I spent 40 minutes counseling the patient face to face. The total time  spent in the appointment was 55 minutes and more than 50% was on counseling and review of test results  [name], MD 12/13/2017 7:50 AM

## 2017-12-13 NOTE — Assessment & Plan Note (Signed)
Overall, her performance status is improved with aggressive family intervention, home physical therapy, advanced home care services with IV fluids hydration therapy and social worker visits We will proceed with treatment as scheduled.  After this, she will get repeat imaging study next month for objective response to therapy

## 2017-12-13 NOTE — Assessment & Plan Note (Signed)
She has significant poorly controlled diabetes at home due to her preference for sweet foods such as peaches and yogurt She will continue metformin for now

## 2017-12-15 ENCOUNTER — Other Ambulatory Visit: Payer: Self-pay | Admitting: Hematology and Oncology

## 2017-12-15 ENCOUNTER — Telehealth: Payer: Self-pay | Admitting: Hematology and Oncology

## 2017-12-15 DIAGNOSIS — C801 Malignant (primary) neoplasm, unspecified: Secondary | ICD-10-CM

## 2017-12-15 DIAGNOSIS — G893 Neoplasm related pain (acute) (chronic): Secondary | ICD-10-CM | POA: Diagnosis not present

## 2017-12-15 DIAGNOSIS — C787 Secondary malignant neoplasm of liver and intrahepatic bile duct: Secondary | ICD-10-CM | POA: Diagnosis not present

## 2017-12-15 DIAGNOSIS — G63 Polyneuropathy in diseases classified elsewhere: Secondary | ICD-10-CM | POA: Diagnosis not present

## 2017-12-15 DIAGNOSIS — I10 Essential (primary) hypertension: Secondary | ICD-10-CM | POA: Diagnosis not present

## 2017-12-15 DIAGNOSIS — Z7901 Long term (current) use of anticoagulants: Secondary | ICD-10-CM | POA: Diagnosis not present

## 2017-12-15 DIAGNOSIS — M25562 Pain in left knee: Secondary | ICD-10-CM | POA: Diagnosis not present

## 2017-12-15 DIAGNOSIS — E7800 Pure hypercholesterolemia, unspecified: Secondary | ICD-10-CM | POA: Diagnosis not present

## 2017-12-15 DIAGNOSIS — Z0000 Encounter for general adult medical examination without abnormal findings: Secondary | ICD-10-CM | POA: Diagnosis not present

## 2017-12-15 DIAGNOSIS — Z452 Encounter for adjustment and management of vascular access device: Secondary | ICD-10-CM | POA: Diagnosis not present

## 2017-12-15 DIAGNOSIS — F339 Major depressive disorder, recurrent, unspecified: Secondary | ICD-10-CM | POA: Diagnosis not present

## 2017-12-15 DIAGNOSIS — E099 Drug or chemical induced diabetes mellitus without complications: Secondary | ICD-10-CM | POA: Diagnosis not present

## 2017-12-15 DIAGNOSIS — E1169 Type 2 diabetes mellitus with other specified complication: Secondary | ICD-10-CM | POA: Diagnosis not present

## 2017-12-15 DIAGNOSIS — I480 Paroxysmal atrial fibrillation: Secondary | ICD-10-CM | POA: Diagnosis not present

## 2017-12-15 NOTE — Telephone Encounter (Signed)
Tried to call regarding voicemail added flush per 10/24 sch mag

## 2017-12-16 ENCOUNTER — Ambulatory Visit (INDEPENDENT_AMBULATORY_CARE_PROVIDER_SITE_OTHER): Payer: 59 | Admitting: Physician Assistant

## 2017-12-16 ENCOUNTER — Inpatient Hospital Stay: Payer: 59

## 2017-12-16 ENCOUNTER — Encounter: Payer: Self-pay | Admitting: Physician Assistant

## 2017-12-16 ENCOUNTER — Other Ambulatory Visit: Payer: Self-pay | Admitting: Hematology and Oncology

## 2017-12-16 ENCOUNTER — Telehealth: Payer: Self-pay

## 2017-12-16 VITALS — BP 122/88 | HR 105 | Ht 67.0 in | Wt 221.2 lb

## 2017-12-16 DIAGNOSIS — C539 Malignant neoplasm of cervix uteri, unspecified: Secondary | ICD-10-CM

## 2017-12-16 DIAGNOSIS — Z79899 Other long term (current) drug therapy: Secondary | ICD-10-CM | POA: Diagnosis not present

## 2017-12-16 DIAGNOSIS — C801 Malignant (primary) neoplasm, unspecified: Secondary | ICD-10-CM

## 2017-12-16 DIAGNOSIS — Z452 Encounter for adjustment and management of vascular access device: Secondary | ICD-10-CM | POA: Diagnosis not present

## 2017-12-16 DIAGNOSIS — G893 Neoplasm related pain (acute) (chronic): Secondary | ICD-10-CM | POA: Diagnosis not present

## 2017-12-16 DIAGNOSIS — R1013 Epigastric pain: Secondary | ICD-10-CM | POA: Diagnosis not present

## 2017-12-16 DIAGNOSIS — I6330 Cerebral infarction due to thrombosis of unspecified cerebral artery: Secondary | ICD-10-CM

## 2017-12-16 DIAGNOSIS — C787 Secondary malignant neoplasm of liver and intrahepatic bile duct: Secondary | ICD-10-CM | POA: Diagnosis not present

## 2017-12-16 DIAGNOSIS — Z5112 Encounter for antineoplastic immunotherapy: Secondary | ICD-10-CM | POA: Diagnosis not present

## 2017-12-16 DIAGNOSIS — D6481 Anemia due to antineoplastic chemotherapy: Secondary | ICD-10-CM | POA: Diagnosis not present

## 2017-12-16 DIAGNOSIS — R112 Nausea with vomiting, unspecified: Secondary | ICD-10-CM | POA: Diagnosis not present

## 2017-12-16 DIAGNOSIS — Z95828 Presence of other vascular implants and grafts: Secondary | ICD-10-CM

## 2017-12-16 DIAGNOSIS — E871 Hypo-osmolality and hyponatremia: Secondary | ICD-10-CM | POA: Diagnosis not present

## 2017-12-16 LAB — COMPREHENSIVE METABOLIC PANEL
ALBUMIN: 2.2 g/dL — AB (ref 3.5–5.0)
ALT: 17 U/L (ref 0–44)
AST: 47 U/L — ABNORMAL HIGH (ref 15–41)
Alkaline Phosphatase: 200 U/L — ABNORMAL HIGH (ref 38–126)
Anion gap: 13 (ref 5–15)
BILIRUBIN TOTAL: 1.3 mg/dL — AB (ref 0.3–1.2)
BUN: 8 mg/dL (ref 6–20)
CHLORIDE: 88 mmol/L — AB (ref 98–111)
CO2: 28 mmol/L (ref 22–32)
CREATININE: 0.59 mg/dL (ref 0.44–1.00)
Calcium: 9.3 mg/dL (ref 8.9–10.3)
GFR calc Af Amer: >60 mL/min (ref >60)
GLUCOSE: 132 mg/dL — AB (ref 70–99)
POTASSIUM: 3.8 mmol/L (ref 3.5–5.1)
Sodium: 129 mmol/L — ABNORMAL LOW (ref 135–145)
TOTAL PROTEIN: 6.8 g/dL (ref 6.5–8.1)

## 2017-12-16 LAB — CBC WITH DIFFERENTIAL/PLATELET
ABS IMMATURE GRANULOCYTES: 0.12 10*3/uL — AB (ref 0.00–0.07)
BASOS ABS: 0 10*3/uL (ref 0.0–0.1)
Basophils Relative: 0 %
Eosinophils Absolute: 0 10*3/uL (ref 0.0–0.5)
Eosinophils Relative: 0 %
HEMATOCRIT: 30.5 % — AB (ref 36.0–46.0)
Hemoglobin: 9.2 g/dL — ABNORMAL LOW (ref 12.0–15.0)
IMMATURE GRANULOCYTES: 1 %
Lymphocytes Relative: 8 %
Lymphs Abs: 2 10*3/uL (ref 0.7–4.0)
MCH: 26.3 pg (ref 26.0–34.0)
MCHC: 30.2 g/dL (ref 30.0–36.0)
MCV: 87.1 fL (ref 80.0–100.0)
MONO ABS: 1.6 10*3/uL — AB (ref 0.1–1.0)
Monocytes Relative: 7 %
Neutro Abs: 20.3 10*3/uL — ABNORMAL HIGH (ref 1.7–7.7)
Neutrophils Relative %: 84 %
Platelets: UNDETERMINED 10*3/uL (ref 150–400)
RBC: 3.5 MIL/uL — ABNORMAL LOW (ref 3.87–5.11)
RDW: 19.8 % — AB (ref 11.5–15.5)
WBC: 24.1 10*3/uL — AB (ref 4.0–10.5)
nRBC: 0 % (ref 0.0–0.2)

## 2017-12-16 LAB — TSH: TSH: 1.083 u[IU]/mL (ref 0.308–3.960)

## 2017-12-16 MED ORDER — SODIUM CHLORIDE 0.9% FLUSH
10.0000 mL | Freq: Once | INTRAVENOUS | Status: AC
  Administered 2017-12-16: 10 mL
  Filled 2017-12-16: qty 10

## 2017-12-16 MED ORDER — HEPARIN SOD (PORK) LOCK FLUSH 100 UNIT/ML IV SOLN
250.0000 [IU] | Freq: Once | INTRAVENOUS | Status: AC
  Administered 2017-12-16: 250 [IU]
  Filled 2017-12-16: qty 5

## 2017-12-16 NOTE — Progress Notes (Signed)
Chief Complaint: GERD  HPI:    [name] is a 52 year old female with a past medical history of A. fib and stroke on Xarelto (12/06/2015 echo LVEF 60-65%), cancer of unknown origin with mets to the liver, as well as others listed below, who was referred to me by Lennie Odor, PA-C for a complaint of GERD.     12/18/2015 EGD by Dr. Amedeo Plenty with [facility] showing normal esophagus, gastritis and normal examined duodenum.    Patient does see Dr. Alvy Bimler with oncology for cancer of unknown origin with mets to her liver.  She is currently on chemotherapy with Keytruda.    10/21/2017 CT abdomen pelvis with contrast showed interval progression of liver mets, poorly marginated left pelvic sidewall soft tissue mass mildly decreased in size, diffuse hepatic steatosis and aortic atherosclerosis.    12/16/2017 labs show sodium low at 129 which is consistent with the patient's baseline, albumin low at 2.2, AST minimally elevated at 47, alk phos elevated 200 and total bilirubin elevated 1.3.    Today, patient presents to clinic accompanied by her home health nurse as well as her husband who assist with history.  Together they explain that initially when the patient was on chemotherapy for the earlier part of this year until May, she had terrible nausea and vomiting and was on scheduled Zofran 8 mg every 6 hours as Metoclopramide 3 times daily.  Patient switched chemotherapies after May and then recently switched again in September to [place].    Describes that for the past 3 months she has an aversion to eating.  Tells me that sometimes food just "feels like it is getting bigger in my mouth".  A lot of foods are unappetizing to her, some things that she can get down are sweeter like fruits and yogurt.  Apparently has only been eating solid foods over the past 8 days.  Also describes some epigastric discomfort which is sharp but seems more related to positional change and is not related to eating.  Patient tells me that  some foods make her nauseous and others will be okay, sometimes just the thought of certain foods make her nauseous Does have episodes of vomiting, mostly liquid with no food.  She can often get down a protein drink.  Family report a total of around 50 pound weight loss since the beginning of this year.          Denies fever, chills, dysphagia, change in bowel habits or symptoms that awaken her from sleep.     Past Medical History:  Diagnosis Date  . Atrial fibrillation ([place])    a. echo 4/07: EF 60%  . Atrial tachycardia ([place])    a. s/p EPS 4/09: no inducible SVT - med Tx continued  . Cancer ([place])   . Complication of anesthesia    age 69yo, woke during procedure with GA, paralysed   . Endometrial polyp   . Hyperlipidemia   . Hypertension   . PONV (postoperative nausea and vomiting)   . Rectocele    WITHOUT MENTION OF UTERINE PROLAPSE  . Stroke ([place])   . SVD (spontaneous vaginal delivery)    x 1    Past Surgical History:  Procedure Laterality Date  . COLONOSCOPY    . HYSTEROSCOPY    . IR FLUORO GUIDE PORT INSERTION RIGHT  03/23/2017  . IR US GUIDE VASC ACCESS RIGHT  03/23/2017  . LEEP    . Pylonidal cystect    . TUBAL LIGATION    .  WISDOM TOOTH EXTRACTION      Current Outpatient Medications  Medication Sig Dispense Refill  . atorvastatin (LIPITOR) 40 MG tablet Take 1 tablet (40 mg total) by mouth daily at 6 PM. 30 tablet 0  . cholecalciferol (VITAMIN D) 1000 units tablet Take 2,000 Units by mouth daily.    . citalopram (CELEXA) 40 MG tablet Take 20 mg by mouth daily.  1  . dronabinol (MARINOL) 2.5 MG capsule Take 1 capsule (2.5 mg total) by mouth 2 (two) times daily before a meal. 60 capsule 1  . flecainide (TAMBOCOR) 50 MG tablet Take 1 tablet (50 mg total) by mouth daily. Please make overdue appt with Dr. Lovena Le before anymore refills. 1st attempt 30 tablet 0  . lidocaine-prilocaine (EMLA) cream APPLY 1 APPLICATION TO AFFECTED AREA(S) TOPICALLY AS NEEDED (Patient taking  differently: Apply 1 application topically once. ) 30 g 9  . magnesium oxide (MAG-OX) 400 (241.3 Mg) MG tablet Take 1 tablet (400 mg total) by mouth 2 (two) times daily. 60 tablet 11  . metFORMIN (GLUCOPHAGE) 500 MG tablet Take 1 tablet (500 mg total) by mouth 2 (two) times daily. 60 tablet 11  . metoprolol tartrate (LOPRESSOR) 50 MG tablet Take 1 tablet (50 mg total) by mouth 2 (two) times daily. 90 tablet 3  . ondansetron (ZOFRAN) 8 MG tablet Take 1 tablet (8 mg total) by mouth every 8 (eight) hours as needed for nausea. 90 tablet 1  . polyethylene glycol (MIRALAX / GLYCOLAX) packet Take 17 g by mouth daily. (Patient not taking: Reported on 09/15/2017) 14 each 0  . protein supplement shake (PREMIER PROTEIN) LIQD Take 325 mLs (11 oz total) by mouth 2 (two) times daily between meals. 60 Can 9  . rivaroxaban (XARELTO) 20 MG TABS tablet Take 1 tablet (20 mg total) by mouth daily. 30 tablet 0   No current facility-administered medications for this visit.     Allergies as of 12/16/2017  . (No Known Allergies)    Family History  Problem Relation Age of Onset  . Hypertension Father   . Diabetes Father   . Stroke Father   . Hypertension Mother   . Stroke Mother   . Cancer Mother        cervical ca  . Cancer Sister 97       uterine ca  . Cancer Maternal Grandmother        lung ca    Social History   Socioeconomic History  . Marital status: Married    Spouse name: Kasandra Knudsen  . Number of children: 1  . Years of education: Not on file  . Highest education level: Not on file  Occupational History  . Occupation: nurse  Social Needs  . Financial resource strain: Not on file  . Food insecurity:    Worry: Not on file    Inability: Not on file  . Transportation needs:    Medical: Not on file    Non-medical: Not on file  Tobacco Use  . Smoking status: Former Smoker    Packs/day: 0.50    Years: 10.00    Pack years: 5.00    Types: Cigarettes    Last attempt to quit: 02/22/1998    Years  since quitting: 19.8  . Smokeless tobacco: Never Used  Substance and Sexual Activity  . Alcohol use: No  . Drug use: No  . Sexual activity: Not on file  Lifestyle  . Physical activity:    Days per week: Not on file  Minutes per session: Not on file  . Stress: Not on file  Relationships  . Social connections:    Talks on phone: Not on file    Gets together: Not on file    Attends religious service: Not on file    Active member of club or organization: Not on file    Attends meetings of clubs or organizations: Not on file    Relationship status: Not on file  . Intimate partner violence:    Fear of current or ex partner: Not on file    Emotionally abused: Not on file    Physically abused: Not on file    Forced sexual activity: Not on file  Other Topics Concern  . Not on file  Social History Narrative  . Not on file    Review of Systems:    Constitutional: No fever or chills Skin: No rash Cardiovascular: No chest pain Respiratory: No SOB  Gastrointestinal: See HPI and otherwise negative Genitourinary: No dysuria  Neurological: No headache, dizziness or syncope Musculoskeletal: No new muscle or joint pain Hematologic: No bleeding or bruising Psychiatric: +depression   Physical Exam:  Vital signs: BP 122/88   Pulse (!) 105   Ht 5' 7" (1.702 m)   Wt 221 lb 3.2 oz (100.3 kg)   LMP 02/12/2012   BMI 34.64 kg/m   Constitutional:   Pleasant chronically ill appearing Caucasian female appears to be in NAD, Well developed, Well nourished, alert and cooperative Head:  Normocephalic and atraumatic. Eyes:   PEERL, EOMI. No icterus. Conjunctiva pink. Ears:  Normal auditory acuity. Neck:  Supple Throat: Oral cavity and pharynx without inflammation, swelling or lesion.  Respiratory: Respirations even and unlabored. Lungs clear to auscultation bilaterally.   No wheezes, crackles, or rhonchi.  Cardiovascular: Normal S1, S2. No MRG. Regular rate and rhythm. No peripheral edema,  cyanosis or pallor.  Gastrointestinal:  Soft, nondistended, nontender. No rebound or guarding. Normal bowel sounds. No appreciable masses or hepatomegaly. Rectal:  Not performed.  Msk:  Symmetrical without gross deformities. Without edema, no deformity or joint abnormality.  Neurologic:  Alert and  oriented x4;  grossly normal neurologically.  Skin:   Dry and intact without significant lesions or rashes. Psychiatric: Demonstrates good judgement and reason without abnormal affect or behaviors.  RELEVANT LABS AND IMAGING: CBC    Component Value Date/Time   WBC 24.1 (H) 12/16/2017 1238   RBC 3.50 (L) 12/16/2017 1238   HGB 9.2 (L) 12/16/2017 1238   HGB 10.3 (L) 07/19/2017 0932   HCT 30.5 (L) 12/16/2017 1238   PLT PENDING 12/16/2017 1238   PLT 186 07/19/2017 0932   MCV 87.1 12/16/2017 1238   MCH 26.3 12/16/2017 1238   MCHC 30.2 12/16/2017 1238   RDW 19.8 (H) 12/16/2017 1238   LYMPHSABS PENDING 12/16/2017 1238   MONOABS PENDING 12/16/2017 1238   EOSABS PENDING 12/16/2017 1238   BASOSABS PENDING 12/16/2017 1238    CMP     Component Value Date/Time   NA 129 (L) 12/16/2017 1238   K 3.8 12/16/2017 1238   CL 88 (L) 12/16/2017 1238   CO2 28 12/16/2017 1238   GLUCOSE 132 (H) 12/16/2017 1238   BUN 8 12/16/2017 1238   CREATININE 0.59 12/16/2017 1238   CREATININE 0.81 06/28/2017 0851   CALCIUM 9.3 12/16/2017 1238   PROT 6.8 12/16/2017 1238   ALBUMIN 2.2 (L) 12/16/2017 1238   AST 47 (H) 12/16/2017 1238   AST 43 (H) 06/28/2017 0851   ALT 17 12/16/2017  1238   ALT 55 06/28/2017 0851   ALKPHOS 200 (H) 12/16/2017 1238   BILITOT 1.3 (H) 12/16/2017 1238   BILITOT 0.6 06/28/2017 0851   GFRNONAA >60 12/16/2017 1238   GFRNONAA >60 06/28/2017 0851   GFRAA >60 12/16/2017 1238   GFRAA >60 06/28/2017 0851    Assessment: 1.  Nausea and vomiting: Patient on chemotherapy for cancer of unknown origin with mets to her liver over the past 10 months, cycles of worse nausea and vomiting, most  recently has just started back eating solid foods over the past 8 days and is having some increased nausea as well as food aversion; Likely related to chemotherapy +/- gastritis 2.  Epigastric pain: Mostly with positional change, likely musculoskeletal, no change with eating, though could consider gastritis  Plan: 1.  Ordered an upper GI series with small bowel follow-through.  Patient tells me she may have trouble with the contrast.  Recommend she uses Zofran 8 mg 20 to 30 minutes before swallowing the contrast.  If this is not helping she can take 1 Reglan. 2.  Would recommend they schedule Zofran every 6 hours 3.  Started Pantoprazole 40 mg daily, 30-60 minutes before breakfast #30 with 5 refills 4.  Continue to supplement at least 3 times daily with protein shakes 5.  Patient requests to follow with Dr. Carlean Purl, as she has worked with him in the past.  She will be seen in clinic in 3 to 4 weeks by him or me for follow-up.  If all the above is unrevealing and symptoms continue would recommend an EGD (of note family tells me we will need to wait 3 mos out from previous chemotherapy for procedure-this would be December?).  Ellouise Newer, PA-C [facility] 12/16/2017, 1:47 PM  Cc: Lennie Odor, PA-C

## 2017-12-16 NOTE — Patient Instructions (Signed)
If you are age 54 or older, your body mass index should be between 23-30. Your Body mass index is 34.64 kg/m. If this is out of the aforementioned range listed, please consider follow up with your Primary Care Provider.  If you are age 71 or younger, your body mass index should be between 19-25. Your Body mass index is 34.64 kg/m. If this is out of the aformentioned range listed, please consider follow up with your Primary Care Provider.   You have been scheduled for an Upper GI Series and Small Bowel Follow Thru at [facility]. Your appointment is on 01/06/18 at 9:30 am. Please arrive 15 minutes prior to your test for registration. Make certain not to have anything to eat or drink after midnight on the night before your test. If you need to reschedule, please contact radiology at [phone]. --------------------------------------------------------------------------------------------------------------- An upper GI series uses x rays to help diagnose problems of the upper GI tract, which includes the esophagus, stomach, and duodenum. The duodenum is the first part of the small intestine. An upper GI series is conducted by a radiology technologist or a radiologist-a doctor who specializes in x-ray imaging-at a hospital or outpatient center. While sitting or standing in front of an x-ray machine, the patient drinks barium liquid, which is often white and has a chalky consistency and taste. The barium liquid coats the lining of the upper GI tract and makes signs of disease show up more clearly on x rays. X-ray video, called fluoroscopy, is used to view the barium liquid moving through the esophagus, stomach, and duodenum. Additional x rays and fluoroscopy are performed while the patient lies on an x-ray table. To fully coat the upper GI tract with barium liquid, the technologist or radiologist may press on the abdomen or ask the patient to change position. Patients hold still in various positions,  allowing the technologist or radiologist to take x rays of the upper GI tract at different angles. If a technologist conducts the upper GI series, a radiologist will later examine the images to look for problems.  This test typically takes about 1 hour to complete --------------------------------------------------------------------------------------------------------------------------------------------- The Small Bowel Follow Thru examination is used to visualize the entire small bowel (intestines); specifically the connection between the small and large intestine. You will be positioned on a flat x-ray table and an image of your abdomen taken. Then the technologist will show the x-ray to the radiologist. The radiologist will instruct your technologist how much (1-2 cups) barium sulfate you will drink and when to begin taking the timed x-rays, usually 15-30 minutes after you begin drinking. Barium is a harmless substance that will highlight your small intestine by absorbing x-ray. The taste is chalky and it feels very heavy both in the cup and in your stomach.  After the first x-ray is taken and shown to the radiologist, he/she will determine when the next image is to be taken. This is repeated until the barium has reached the end of the small intestine and enters the beginning of the colon (cecum). At such time when the barium spills into the colon, you will be positioned on the x-ray table once again. The radiologist will use a fluoroscopic camera to take some detailed pictures of the connection between your small intestine and colon. The fluoroscope is an x-ray unit that works with a television/computer screen. The radiologist will apply pressure to your abdomen with his/her hand and a lead glove, a plastic paddle, or a paddle with an inflated  rubber balloon on the end. This is to spread apart your loops of intestine so he/she can see all areas.   This test typically takes around 1 hour to  complete.  Important Drink plenty of water (8-10 cups/day) for a few days following the procedure to avoid constipation and blockage. The barium will make your stools white for a few days. ---------------------------------------------------------------------------------------------------------------------------Schedule Zofran 8 mg every 6 hours. Take Zofran 20 minutes before trying to drink contrast. If doesn't work, take a Metoclopramide.  Follow up with me on January 13, 2018 at 2:15 pm.  Thank you for choosing me and [facility].   Ellouise Newer, PA-C

## 2017-12-16 NOTE — Telephone Encounter (Signed)
Called and given below message. They verbalized understanding.   Requesting 500 ml to 1 liter of IV fluids every other day as needed. Per Alvy Bimler, okay with request.  Notified [name] with [facility] of above order. She verbalized understanding.

## 2017-12-16 NOTE — Telephone Encounter (Signed)
-----   Message from [name], MD sent at 12/16/2017  1:54 PM EDT ----- Regarding: labs are a bit better Sodium a bit better Other labs are stable Please follow on CT scan ----- Message ----- From: Interface, Lab In [place] Sent: 12/16/2017   1:24 PM EDT To: [name], MD

## 2017-12-17 DIAGNOSIS — E440 Moderate protein-calorie malnutrition: Secondary | ICD-10-CM | POA: Diagnosis not present

## 2017-12-17 DIAGNOSIS — R112 Nausea with vomiting, unspecified: Secondary | ICD-10-CM | POA: Diagnosis not present

## 2017-12-17 DIAGNOSIS — C801 Malignant (primary) neoplasm, unspecified: Secondary | ICD-10-CM | POA: Diagnosis not present

## 2017-12-17 DIAGNOSIS — Z7984 Long term (current) use of oral hypoglycemic drugs: Secondary | ICD-10-CM | POA: Diagnosis not present

## 2017-12-17 DIAGNOSIS — Z79899 Other long term (current) drug therapy: Secondary | ICD-10-CM | POA: Diagnosis not present

## 2017-12-17 DIAGNOSIS — C787 Secondary malignant neoplasm of liver and intrahepatic bile duct: Secondary | ICD-10-CM | POA: Diagnosis not present

## 2017-12-17 DIAGNOSIS — C539 Malignant neoplasm of cervix uteri, unspecified: Secondary | ICD-10-CM | POA: Diagnosis not present

## 2017-12-17 DIAGNOSIS — I480 Paroxysmal atrial fibrillation: Secondary | ICD-10-CM | POA: Diagnosis not present

## 2017-12-17 DIAGNOSIS — G4733 Obstructive sleep apnea (adult) (pediatric): Secondary | ICD-10-CM | POA: Diagnosis not present

## 2017-12-19 ENCOUNTER — Other Ambulatory Visit: Payer: Self-pay | Admitting: Hematology and Oncology

## 2017-12-19 ENCOUNTER — Telehealth: Payer: Self-pay | Admitting: Physician Assistant

## 2017-12-19 ENCOUNTER — Telehealth: Payer: Self-pay

## 2017-12-19 DIAGNOSIS — C539 Malignant neoplasm of cervix uteri, unspecified: Secondary | ICD-10-CM

## 2017-12-19 DIAGNOSIS — C801 Malignant (primary) neoplasm, unspecified: Secondary | ICD-10-CM

## 2017-12-19 DIAGNOSIS — E440 Moderate protein-calorie malnutrition: Secondary | ICD-10-CM | POA: Diagnosis not present

## 2017-12-19 DIAGNOSIS — I480 Paroxysmal atrial fibrillation: Secondary | ICD-10-CM | POA: Diagnosis not present

## 2017-12-19 DIAGNOSIS — R112 Nausea with vomiting, unspecified: Secondary | ICD-10-CM | POA: Diagnosis not present

## 2017-12-19 DIAGNOSIS — C787 Secondary malignant neoplasm of liver and intrahepatic bile duct: Secondary | ICD-10-CM | POA: Diagnosis not present

## 2017-12-19 DIAGNOSIS — D539 Nutritional anemia, unspecified: Secondary | ICD-10-CM

## 2017-12-19 DIAGNOSIS — G4733 Obstructive sleep apnea (adult) (pediatric): Secondary | ICD-10-CM | POA: Diagnosis not present

## 2017-12-19 MED ORDER — PANTOPRAZOLE SODIUM 40 MG PO TBEC: 40.0000 mg | DELAYED_RELEASE_TABLET | Freq: Every day | ORAL | 5 refills | Status: DC

## 2017-12-19 MED FILL — PANTOPRAZOLE SOD DR 40 MG T: 40 | 30 days supply | Qty: 30 | Fill #0

## 2017-12-19 NOTE — Telephone Encounter (Signed)
Patients husband is now calling requesting the same thing. Medication protonix sent to [facility] out patient and a call.

## 2017-12-19 NOTE — Telephone Encounter (Signed)
I spoke with the patients daughter and let her know Dr. Calton Dach advice.  She can take an iron supplement OTC, starting with one daily hand an hour before she eats.  Dr. Alvy Bimler will check her ferritin levels at her next visit.  Her daughter verbalized understanding.

## 2017-12-19 NOTE — Telephone Encounter (Signed)
-----   Message from [name], MD sent at 12/19/2017  3:17 PM EDT ----- Regarding: RE: Iron The last time we checked her iron level her ferritin was very high I can certainly recheck in her next visit; she can take oral iron supplement OTC, recommend start with one daily, half an hours before she eats Her last TSH was checked and it was within normal limits ----- Message ----- From: Purcell Nails, RN Sent: 12/19/2017   2:43 PM EDT To: [name], MD Subject: Iron                                           This patients daughter called to ask if her mother will need to take any iron supplement over the counter.  She said that she is extremely fatigued.  She also stated that she was incontinent yesterday which is not normal for her.  I asked if she needed to be seen in the [facility] and she said she would rather not.  Please advise.  Thank you.   Erasmo Downer

## 2017-12-19 NOTE — Telephone Encounter (Signed)
Pt's niece Vernie Shanks called to inform that pt's pharmacy has not received prescription for pantoprazole, pls send it to [facility] out[facility] pharmacy. Vernie Shanks would like a call back when it is taken care of.

## 2017-12-19 NOTE — Telephone Encounter (Signed)
Rx sent to pharmacy. Patients husband informed.

## 2017-12-21 DIAGNOSIS — E099 Drug or chemical induced diabetes mellitus without complications: Secondary | ICD-10-CM | POA: Diagnosis not present

## 2017-12-21 DIAGNOSIS — Z7901 Long term (current) use of anticoagulants: Secondary | ICD-10-CM | POA: Diagnosis not present

## 2017-12-21 DIAGNOSIS — I480 Paroxysmal atrial fibrillation: Secondary | ICD-10-CM | POA: Diagnosis not present

## 2017-12-21 DIAGNOSIS — G893 Neoplasm related pain (acute) (chronic): Secondary | ICD-10-CM | POA: Diagnosis not present

## 2017-12-21 DIAGNOSIS — C801 Malignant (primary) neoplasm, unspecified: Secondary | ICD-10-CM | POA: Diagnosis not present

## 2017-12-21 DIAGNOSIS — Z79899 Other long term (current) drug therapy: Secondary | ICD-10-CM | POA: Diagnosis not present

## 2017-12-21 DIAGNOSIS — Z7984 Long term (current) use of oral hypoglycemic drugs: Secondary | ICD-10-CM | POA: Diagnosis not present

## 2017-12-21 DIAGNOSIS — G63 Polyneuropathy in diseases classified elsewhere: Secondary | ICD-10-CM | POA: Diagnosis not present

## 2017-12-21 DIAGNOSIS — Z452 Encounter for adjustment and management of vascular access device: Secondary | ICD-10-CM | POA: Diagnosis not present

## 2017-12-21 DIAGNOSIS — C787 Secondary malignant neoplasm of liver and intrahepatic bile duct: Secondary | ICD-10-CM | POA: Diagnosis not present

## 2017-12-23 ENCOUNTER — Ambulatory Visit (HOSPITAL_COMMUNITY)
Admission: RE | Admit: 2017-12-23 | Discharge: 2017-12-23 | Disposition: A | Discharge status: Home / Self Care | Payer: 59 | Source: Ambulatory Visit | Attending: Hematology and Oncology | Admitting: Hematology and Oncology

## 2017-12-23 ENCOUNTER — Telehealth: Payer: Self-pay

## 2017-12-23 DIAGNOSIS — N280 Ischemia and infarction of kidney: Secondary | ICD-10-CM | POA: Insufficient documentation

## 2017-12-23 DIAGNOSIS — C787 Secondary malignant neoplasm of liver and intrahepatic bile duct: Secondary | ICD-10-CM

## 2017-12-23 DIAGNOSIS — R627 Adult failure to thrive: Secondary | ICD-10-CM | POA: Diagnosis not present

## 2017-12-23 DIAGNOSIS — M8588 Other specified disorders of bone density and structure, other site: Secondary | ICD-10-CM | POA: Insufficient documentation

## 2017-12-23 DIAGNOSIS — C7951 Secondary malignant neoplasm of bone: Secondary | ICD-10-CM | POA: Diagnosis not present

## 2017-12-23 DIAGNOSIS — R188 Other ascites: Secondary | ICD-10-CM | POA: Insufficient documentation

## 2017-12-23 DIAGNOSIS — D735 Infarction of spleen: Secondary | ICD-10-CM | POA: Insufficient documentation

## 2017-12-23 DIAGNOSIS — Z515 Encounter for palliative care: Secondary | ICD-10-CM | POA: Diagnosis not present

## 2017-12-23 DIAGNOSIS — E785 Hyperlipidemia, unspecified: Secondary | ICD-10-CM | POA: Diagnosis not present

## 2017-12-23 DIAGNOSIS — R180 Malignant ascites: Secondary | ICD-10-CM | POA: Diagnosis not present

## 2017-12-23 DIAGNOSIS — R42 Dizziness and giddiness: Secondary | ICD-10-CM | POA: Diagnosis not present

## 2017-12-23 DIAGNOSIS — E876 Hypokalemia: Secondary | ICD-10-CM | POA: Diagnosis not present

## 2017-12-23 DIAGNOSIS — D6481 Anemia due to antineoplastic chemotherapy: Secondary | ICD-10-CM | POA: Diagnosis not present

## 2017-12-23 DIAGNOSIS — R410 Disorientation, unspecified: Secondary | ICD-10-CM | POA: Diagnosis not present

## 2017-12-23 DIAGNOSIS — D61818 Other pancytopenia: Secondary | ICD-10-CM | POA: Diagnosis not present

## 2017-12-23 DIAGNOSIS — I10 Essential (primary) hypertension: Secondary | ICD-10-CM | POA: Diagnosis not present

## 2017-12-23 DIAGNOSIS — Z6834 Body mass index (BMI) 34.0-34.9, adult: Secondary | ICD-10-CM | POA: Diagnosis not present

## 2017-12-23 DIAGNOSIS — Z7984 Long term (current) use of oral hypoglycemic drugs: Secondary | ICD-10-CM | POA: Diagnosis not present

## 2017-12-23 DIAGNOSIS — C801 Malignant (primary) neoplasm, unspecified: Secondary | ICD-10-CM

## 2017-12-23 DIAGNOSIS — Z7189 Other specified counseling: Secondary | ICD-10-CM | POA: Diagnosis not present

## 2017-12-23 DIAGNOSIS — I480 Paroxysmal atrial fibrillation: Secondary | ICD-10-CM | POA: Diagnosis not present

## 2017-12-23 DIAGNOSIS — D696 Thrombocytopenia, unspecified: Secondary | ICD-10-CM | POA: Diagnosis not present

## 2017-12-23 DIAGNOSIS — E43 Unspecified severe protein-calorie malnutrition: Secondary | ICD-10-CM | POA: Diagnosis not present

## 2017-12-23 DIAGNOSIS — Z79899 Other long term (current) drug therapy: Secondary | ICD-10-CM | POA: Diagnosis not present

## 2017-12-23 DIAGNOSIS — R918 Other nonspecific abnormal finding of lung field: Secondary | ICD-10-CM | POA: Insufficient documentation

## 2017-12-23 DIAGNOSIS — R531 Weakness: Secondary | ICD-10-CM | POA: Diagnosis not present

## 2017-12-23 DIAGNOSIS — A419 Sepsis, unspecified organism: Secondary | ICD-10-CM | POA: Diagnosis not present

## 2017-12-23 DIAGNOSIS — Z8673 Personal history of transient ischemic attack (TIA), and cerebral infarction without residual deficits: Secondary | ICD-10-CM | POA: Diagnosis not present

## 2017-12-23 DIAGNOSIS — T451X5A Adverse effect of antineoplastic and immunosuppressive drugs, initial encounter: Secondary | ICD-10-CM | POA: Diagnosis not present

## 2017-12-23 DIAGNOSIS — D649 Anemia, unspecified: Secondary | ICD-10-CM | POA: Diagnosis not present

## 2017-12-23 DIAGNOSIS — J90 Pleural effusion, not elsewhere classified: Secondary | ICD-10-CM | POA: Diagnosis not present

## 2017-12-23 DIAGNOSIS — E871 Hypo-osmolality and hyponatremia: Secondary | ICD-10-CM | POA: Diagnosis not present

## 2017-12-23 DIAGNOSIS — G893 Neoplasm related pain (acute) (chronic): Secondary | ICD-10-CM | POA: Diagnosis not present

## 2017-12-23 DIAGNOSIS — I6330 Cerebral infarction due to thrombosis of unspecified cerebral artery: Secondary | ICD-10-CM | POA: Diagnosis not present

## 2017-12-23 DIAGNOSIS — G9341 Metabolic encephalopathy: Secondary | ICD-10-CM | POA: Diagnosis not present

## 2017-12-23 DIAGNOSIS — F332 Major depressive disorder, recurrent severe without psychotic features: Secondary | ICD-10-CM | POA: Diagnosis not present

## 2017-12-23 DIAGNOSIS — K219 Gastro-esophageal reflux disease without esophagitis: Secondary | ICD-10-CM | POA: Diagnosis not present

## 2017-12-23 MED ORDER — IOHEXOL 300 MG/ML  SOLN
100.0000 mL | Freq: Once | INTRAMUSCULAR | Status: AC | PRN
  Administered 2017-12-23: 100 mL via INTRAVENOUS

## 2017-12-23 MED ORDER — SODIUM CHLORIDE 0.9 % IJ SOLN
INTRAMUSCULAR | Status: AC
  Filled 2017-12-23: qty 50

## 2017-12-23 MED ORDER — HEPARIN SOD (PORK) LOCK FLUSH 100 UNIT/ML IV SOLN
INTRAVENOUS | Status: AC
  Filled 2017-12-23: qty 5

## 2017-12-23 MED ORDER — HEPARIN SOD (PORK) LOCK FLUSH 100 UNIT/ML IV SOLN
500.0000 [IU] | Freq: Once | INTRAVENOUS | Status: AC
  Administered 2017-12-23: 500 [IU] via INTRAVENOUS

## 2017-12-23 NOTE — Telephone Encounter (Signed)
Husband called and left a message to call him.  Called back. He is concerned about him wife not eating and feeling weak. She is drinking with no problems. She is getting home IV fluids. Offered lab appt today. He declined lab appt. Instructed to continue drinking and call office if needed. He verbalized understanding.

## 2017-12-24 ENCOUNTER — Encounter (HOSPITAL_COMMUNITY): Payer: Self-pay

## 2017-12-24 ENCOUNTER — Other Ambulatory Visit: Payer: Self-pay

## 2017-12-24 ENCOUNTER — Inpatient Hospital Stay (HOSPITAL_COMMUNITY)
Admission: EM | Admit: 2017-12-24 | Discharge: 2018-01-06 | DRG: 871 | Disposition: A | Discharge status: Hospice | Payer: 59 | Attending: Internal Medicine | Admitting: Internal Medicine

## 2017-12-24 ENCOUNTER — Emergency Department (HOSPITAL_COMMUNITY): Payer: 59

## 2017-12-24 DIAGNOSIS — K3184 Gastroparesis: Secondary | ICD-10-CM | POA: Diagnosis present

## 2017-12-24 DIAGNOSIS — R112 Nausea with vomiting, unspecified: Secondary | ICD-10-CM

## 2017-12-24 DIAGNOSIS — Z87891 Personal history of nicotine dependence: Secondary | ICD-10-CM

## 2017-12-24 DIAGNOSIS — A419 Sepsis, unspecified organism: Principal | ICD-10-CM | POA: Diagnosis present

## 2017-12-24 DIAGNOSIS — D696 Thrombocytopenia, unspecified: Secondary | ICD-10-CM | POA: Diagnosis present

## 2017-12-24 DIAGNOSIS — R61 Generalized hyperhidrosis: Secondary | ICD-10-CM

## 2017-12-24 DIAGNOSIS — E785 Hyperlipidemia, unspecified: Secondary | ICD-10-CM | POA: Diagnosis present

## 2017-12-24 DIAGNOSIS — Z79899 Other long term (current) drug therapy: Secondary | ICD-10-CM

## 2017-12-24 DIAGNOSIS — Z8049 Family history of malignant neoplasm of other genital organs: Secondary | ICD-10-CM

## 2017-12-24 DIAGNOSIS — R410 Disorientation, unspecified: Secondary | ICD-10-CM

## 2017-12-24 DIAGNOSIS — C787 Secondary malignant neoplasm of liver and intrahepatic bile duct: Secondary | ICD-10-CM | POA: Diagnosis present

## 2017-12-24 DIAGNOSIS — R918 Other nonspecific abnormal finding of lung field: Secondary | ICD-10-CM | POA: Diagnosis present

## 2017-12-24 DIAGNOSIS — R188 Other ascites: Secondary | ICD-10-CM | POA: Diagnosis present

## 2017-12-24 DIAGNOSIS — C539 Malignant neoplasm of cervix uteri, unspecified: Secondary | ICD-10-CM | POA: Diagnosis present

## 2017-12-24 DIAGNOSIS — E669 Obesity, unspecified: Secondary | ICD-10-CM | POA: Diagnosis present

## 2017-12-24 DIAGNOSIS — E860 Dehydration: Secondary | ICD-10-CM | POA: Diagnosis present

## 2017-12-24 DIAGNOSIS — F332 Major depressive disorder, recurrent severe without psychotic features: Secondary | ICD-10-CM | POA: Diagnosis present

## 2017-12-24 DIAGNOSIS — C786 Secondary malignant neoplasm of retroperitoneum and peritoneum: Secondary | ICD-10-CM | POA: Diagnosis present

## 2017-12-24 DIAGNOSIS — N280 Ischemia and infarction of kidney: Secondary | ICD-10-CM | POA: Diagnosis present

## 2017-12-24 DIAGNOSIS — N816 Rectocele: Secondary | ICD-10-CM

## 2017-12-24 DIAGNOSIS — G63 Polyneuropathy in diseases classified elsewhere: Secondary | ICD-10-CM

## 2017-12-24 DIAGNOSIS — M533 Sacrococcygeal disorders, not elsewhere classified: Secondary | ICD-10-CM

## 2017-12-24 DIAGNOSIS — R5383 Other fatigue: Secondary | ICD-10-CM

## 2017-12-24 DIAGNOSIS — Z6834 Body mass index (BMI) 34.0-34.9, adult: Secondary | ICD-10-CM

## 2017-12-24 DIAGNOSIS — Z515 Encounter for palliative care: Secondary | ICD-10-CM | POA: Diagnosis not present

## 2017-12-24 DIAGNOSIS — R300 Dysuria: Secondary | ICD-10-CM

## 2017-12-24 DIAGNOSIS — E871 Hypo-osmolality and hyponatremia: Secondary | ICD-10-CM | POA: Diagnosis present

## 2017-12-24 DIAGNOSIS — Z9221 Personal history of antineoplastic chemotherapy: Secondary | ICD-10-CM

## 2017-12-24 DIAGNOSIS — D735 Infarction of spleen: Secondary | ICD-10-CM | POA: Diagnosis present

## 2017-12-24 DIAGNOSIS — F419 Anxiety disorder, unspecified: Secondary | ICD-10-CM | POA: Diagnosis present

## 2017-12-24 DIAGNOSIS — D6481 Anemia due to antineoplastic chemotherapy: Secondary | ICD-10-CM | POA: Diagnosis present

## 2017-12-24 DIAGNOSIS — F320 Major depressive disorder, single episode, mild: Secondary | ICD-10-CM

## 2017-12-24 DIAGNOSIS — Z66 Do not resuscitate: Secondary | ICD-10-CM | POA: Diagnosis present

## 2017-12-24 DIAGNOSIS — E099 Drug or chemical induced diabetes mellitus without complications: Secondary | ICD-10-CM

## 2017-12-24 DIAGNOSIS — Z8673 Personal history of transient ischemic attack (TIA), and cerebral infarction without residual deficits: Secondary | ICD-10-CM

## 2017-12-24 DIAGNOSIS — G9341 Metabolic encephalopathy: Secondary | ICD-10-CM | POA: Diagnosis present

## 2017-12-24 DIAGNOSIS — Z8249 Family history of ischemic heart disease and other diseases of the circulatory system: Secondary | ICD-10-CM

## 2017-12-24 DIAGNOSIS — T451X5A Adverse effect of antineoplastic and immunosuppressive drugs, initial encounter: Secondary | ICD-10-CM | POA: Diagnosis present

## 2017-12-24 DIAGNOSIS — E440 Moderate protein-calorie malnutrition: Secondary | ICD-10-CM

## 2017-12-24 DIAGNOSIS — C801 Malignant (primary) neoplasm, unspecified: Secondary | ICD-10-CM

## 2017-12-24 DIAGNOSIS — E538 Deficiency of other specified B group vitamins: Secondary | ICD-10-CM

## 2017-12-24 DIAGNOSIS — K760 Fatty (change of) liver, not elsewhere classified: Secondary | ICD-10-CM | POA: Diagnosis present

## 2017-12-24 DIAGNOSIS — Z923 Personal history of irradiation: Secondary | ICD-10-CM

## 2017-12-24 DIAGNOSIS — J90 Pleural effusion, not elsewhere classified: Secondary | ICD-10-CM | POA: Diagnosis present

## 2017-12-24 DIAGNOSIS — D638 Anemia in other chronic diseases classified elsewhere: Secondary | ICD-10-CM | POA: Diagnosis present

## 2017-12-24 DIAGNOSIS — I10 Essential (primary) hypertension: Secondary | ICD-10-CM | POA: Diagnosis present

## 2017-12-24 DIAGNOSIS — Z95828 Presence of other vascular implants and grafts: Secondary | ICD-10-CM

## 2017-12-24 DIAGNOSIS — R531 Weakness: Secondary | ICD-10-CM

## 2017-12-24 DIAGNOSIS — R222 Localized swelling, mass and lump, trunk: Secondary | ICD-10-CM

## 2017-12-24 DIAGNOSIS — I700 Atherosclerosis of aorta: Secondary | ICD-10-CM | POA: Diagnosis present

## 2017-12-24 DIAGNOSIS — I480 Paroxysmal atrial fibrillation: Secondary | ICD-10-CM | POA: Diagnosis present

## 2017-12-24 DIAGNOSIS — I6330 Cerebral infarction due to thrombosis of unspecified cerebral artery: Secondary | ICD-10-CM | POA: Diagnosis present

## 2017-12-24 DIAGNOSIS — E43 Unspecified severe protein-calorie malnutrition: Secondary | ICD-10-CM | POA: Diagnosis present

## 2017-12-24 DIAGNOSIS — Z833 Family history of diabetes mellitus: Secondary | ICD-10-CM

## 2017-12-24 DIAGNOSIS — Z01818 Encounter for other preprocedural examination: Secondary | ICD-10-CM

## 2017-12-24 DIAGNOSIS — Z7901 Long term (current) use of anticoagulants: Secondary | ICD-10-CM

## 2017-12-24 DIAGNOSIS — R000 Tachycardia, unspecified: Secondary | ICD-10-CM | POA: Diagnosis present

## 2017-12-24 DIAGNOSIS — N179 Acute kidney failure, unspecified: Secondary | ICD-10-CM | POA: Diagnosis not present

## 2017-12-24 DIAGNOSIS — E8770 Fluid overload, unspecified: Secondary | ICD-10-CM | POA: Diagnosis not present

## 2017-12-24 DIAGNOSIS — D539 Nutritional anemia, unspecified: Secondary | ICD-10-CM

## 2017-12-24 DIAGNOSIS — R791 Abnormal coagulation profile: Secondary | ICD-10-CM | POA: Diagnosis present

## 2017-12-24 DIAGNOSIS — Z7984 Long term (current) use of oral hypoglycemic drugs: Secondary | ICD-10-CM

## 2017-12-24 DIAGNOSIS — K219 Gastro-esophageal reflux disease without esophagitis: Secondary | ICD-10-CM | POA: Diagnosis present

## 2017-12-24 DIAGNOSIS — R627 Adult failure to thrive: Secondary | ICD-10-CM | POA: Diagnosis present

## 2017-12-24 DIAGNOSIS — R0602 Shortness of breath: Secondary | ICD-10-CM

## 2017-12-24 DIAGNOSIS — E1143 Type 2 diabetes mellitus with diabetic autonomic (poly)neuropathy: Secondary | ICD-10-CM | POA: Diagnosis present

## 2017-12-24 DIAGNOSIS — R180 Malignant ascites: Secondary | ICD-10-CM

## 2017-12-24 DIAGNOSIS — E876 Hypokalemia: Secondary | ICD-10-CM | POA: Diagnosis present

## 2017-12-24 DIAGNOSIS — G4733 Obstructive sleep apnea (adult) (pediatric): Secondary | ICD-10-CM

## 2017-12-24 DIAGNOSIS — D61818 Other pancytopenia: Secondary | ICD-10-CM

## 2017-12-24 DIAGNOSIS — Z823 Family history of stroke: Secondary | ICD-10-CM

## 2017-12-24 DIAGNOSIS — G893 Neoplasm related pain (acute) (chronic): Secondary | ICD-10-CM

## 2017-12-24 DIAGNOSIS — I639 Cerebral infarction, unspecified: Secondary | ICD-10-CM | POA: Diagnosis present

## 2017-12-24 DIAGNOSIS — Z7189 Other specified counseling: Secondary | ICD-10-CM

## 2017-12-24 DIAGNOSIS — B370 Candidal stomatitis: Secondary | ICD-10-CM | POA: Diagnosis not present

## 2017-12-24 DIAGNOSIS — R5381 Other malaise: Secondary | ICD-10-CM

## 2017-12-24 DIAGNOSIS — N840 Polyp of corpus uteri: Secondary | ICD-10-CM

## 2017-12-24 DIAGNOSIS — T380X5A Adverse effect of glucocorticoids and synthetic analogues, initial encounter: Secondary | ICD-10-CM

## 2017-12-24 LAB — COMPREHENSIVE METABOLIC PANEL
ALT: 24 U/L (ref 0–44)
AST: 47 U/L — ABNORMAL HIGH (ref 15–41)
Albumin: 2 g/dL — ABNORMAL LOW (ref 3.5–5.0)
Alkaline Phosphatase: 168 U/L — ABNORMAL HIGH (ref 38–126)
Anion gap: 11 (ref 5–15)
BUN: 9 mg/dL (ref 6–20)
CO2: 24 mmol/L (ref 22–32)
Calcium: 8.2 mg/dL — ABNORMAL LOW (ref 8.9–10.3)
Chloride: 93 mmol/L — ABNORMAL LOW (ref 98–111)
Creatinine, Ser: 0.58 mg/dL (ref 0.44–1.00)
GFR calc Af Amer: >60 mL/min (ref >60)
GFR calc non Af Amer: >60 mL/min (ref >60)
Glucose, Bld: 128 mg/dL — ABNORMAL HIGH (ref 70–99)
Potassium: 3.3 mmol/L — ABNORMAL LOW (ref 3.5–5.1)
Sodium: 128 mmol/L — ABNORMAL LOW (ref 135–145)
Total Bilirubin: 1.4 mg/dL — ABNORMAL HIGH (ref 0.3–1.2)
Total Protein: 5.8 g/dL — ABNORMAL LOW (ref 6.5–8.1)

## 2017-12-24 LAB — CBG MONITORING, ED: GLUCOSE-CAPILLARY: 126 mg/dL — AB (ref 70–99)

## 2017-12-24 LAB — I-STAT BETA HCG BLOOD, ED (MC, WL, AP ONLY): I-stat hCG, quantitative: <5 m[IU]/mL (ref <5)

## 2017-12-24 LAB — LIPASE, BLOOD: Lipase: 18 U/L (ref 11–51)

## 2017-12-24 MED ORDER — SODIUM CHLORIDE 0.9 % IV SOLN
Freq: Once | INTRAVENOUS | Status: AC
  Administered 2017-12-24: 23:00:00 via INTRAVENOUS

## 2017-12-24 NOTE — ED Provider Notes (Cosign Needed)
[facility] DEPT Provider Note   CSN: [id number] Arrival date & time: 12/24/17  2058     History   Chief Complaint Chief Complaint  Patient presents with  . Fatigue  . Altered Mental Status    HPI Cindy Faulkner is a 52 y.o. female with history of paroxysmal atrial fibrillation, atrial tachycardia, hypertension, hyperlipidemia, CVA, cancer of unknown origin with liver metastases and severe protein calorie malnutrition presents for evaluation of gradual onset, progressively worsening generalized weakness for approximately 2 weeks as well as confusion for the past 3 days.  She states that she is having difficulty remembering simple things such as her husband's name or the year.  She is having ongoing issues with nausea and vomiting as well as decreased appetite.  She states that she is only tolerating watermelon for the past 3 days.  Her husband has been giving her 1 L of IV fluids daily for the past 3 days.  She also notes intermittent nausea and vomiting which has been an ongoing issue with her cancer treatment.  She is currently on Keytruda.  Her chemotherapy was discontinued in May.  She denies any fevers, shortness of breath, chest pain.  She notes chronic intermittent upper abdominal pain primarily in the epigastric region which she states is no worse today than usual.  She notes decreased urine output secondary to decreased oral intake but denies dysuria, hematuria, melena, or hematochezia.  She states that 2 weeks ago she was able to ambulate with the use of a walking stick but currently requires the assistance of a Rollator spite home physical therapy.  The history is provided by the patient.    Past Medical History:  Diagnosis Date  . Atrial fibrillation ([place])    a. echo 4/07: EF 60%  . Atrial tachycardia ([place])    a. s/p EPS 4/09: no inducible SVT - med Tx continued  . Cancer ([place])   . 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 Endometrial polyp   . Hyperlipidemia   . Hypertension   . PONV (postoperative nausea and vomiting)   . Rectocele    WITHOUT MENTION OF UTERINE PROLAPSE  . Stroke ([place])   . SVD (spontaneous vaginal delivery)    x 1    Patient Active Problem List   Diagnosis Date Noted  . Severe protein-calorie malnutrition ([place]) 12/13/2017  . Malnutrition of moderate degree 11/17/2017  . MDD (major depressive disorder), recurrent severe, without psychosis ([place])   . Fever 11/15/2017  . Intractable nausea and vomiting 11/15/2017  . UTI (urinary tract infection) 10/25/2017  . Other fatigue 10/13/2017  . Deficiency anemia 10/13/2017  . Nausea with vomiting 09/22/2017  . Cervical cancer ([place]) 07/20/2017  . Anemia due to antineoplastic chemotherapy 07/19/2017  . Hypomagnesemia 06/28/2017  . Syncopal episodes 06/28/2017  . 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([place]) 04/26/2017  . Physical deconditioning 04/26/2017  . Night sweats 04/12/2017  . Port-A-Cath in place 04/04/2017  . Neuropathy associated with cancer ([place]) 04/04/2017  . Goals of care, counseling/discussion 04/04/2017  . Metastatic squamous cell carcinoma involving liver & sacrum with unknown primary site  03/30/2017  . Metastasis to liver of unknown origin ([place]) 03/18/2017  . Dysuria 03/11/2017  . Cancer of unknown origin ([place]) 03/11/2017  . Metastasis to liver ([place]) 03/03/2017  . Sacral mass 03/02/2017  . Cancer associated pain 03/02/2017  . Mild single current episode of major depressive disorder ([place]) 08/23/2016  . Cerebral  thrombosis with cerebral infarction 12/05/2015  . Stroke (cerebrum) ([place]) 12/05/2015  . Obstructive sleep apnea 07/24/2015  . Preoperative evaluation to rule out surgical contraindication 10/04/2011  . Chest pain, unspecified 06/24/2010  . Shortness of breath 06/24/2010  . Essential hypertension 10/20/2008  . RECTOCELE WITHOUT MENTION OF UTERINE PROLAPSE 10/20/2008  . ENDOMETRIAL POLYP 10/20/2008    . AF (paroxysmal atrial fibrillation) ([place]) 10/20/2008    Past Surgical History:  Procedure Laterality Date  . COLONOSCOPY    . HYSTEROSCOPY    . IR FLUORO GUIDE PORT INSERTION RIGHT  03/23/2017  . IR US GUIDE VASC ACCESS RIGHT  03/23/2017  . LEEP    . Pylonidal cystect    . TUBAL LIGATION    . WISDOM TOOTH EXTRACTION       OB History   None      Home Medications    Prior to Admission medications   Medication Sig Start Date End Date Taking? Authorizing Provider  cholecalciferol (VITAMIN D) 1000 units tablet Take 2,000 Units by mouth daily.    [provider]  citalopram (CELEXA) 40 MG tablet Take 20 mg by mouth daily. 09/20/17   [provider]  dronabinol (MARINOL) 2.5 MG capsule Take 1 capsule (2.5 mg total) by mouth 2 (two) times daily before a meal. 12/12/17   [name], MD  flecainide (TAMBOCOR) 50 MG tablet Take 1 tablet (50 mg total) by mouth daily. Please make overdue appt with Dr. Lovena Le before anymore refills. 1st attempt 11/29/17   Evans Lance, MD  lidocaine-prilocaine (EMLA) cream APPLY 1 APPLICATION TO AFFECTED AREA(S) TOPICALLY AS NEEDED Patient taking differently: Apply 1 application topically once.  11/14/17   [name], MD  magnesium oxide (MAG-OX) 400 (241.3 Mg) MG tablet Take 1 tablet (400 mg total) by mouth 2 (two) times daily. 11/18/17   [name], MD  metFORMIN (GLUCOPHAGE) 500 MG tablet Take 1 tablet (500 mg total) by mouth 2 (two) times daily. 10/31/17   [name], MD  ondansetron (ZOFRAN) 8 MG tablet Take 1 tablet (8 mg total) by mouth every 8 (eight) hours as needed for nausea. 04/26/17   [name], MD  pantoprazole (PROTONIX) 40 MG tablet Take 1 tablet (40 mg total) by mouth daily. 12/19/17   Levin Erp, PA  polyethylene glycol [facility] / GLYCOLAX) packet Take 17 g by mouth daily. 04/01/17   Shelly Coss, MD  protein supplement shake (PREMIER PROTEIN) LIQD Take 325 mLs (11 oz total) by mouth 2 (two) times daily  between meals. 11/18/17   [name], MD  rivaroxaban (XARELTO) 20 MG TABS tablet Take 1 tablet (20 mg total) by mouth daily. 12/06/15   Dhungel, Flonnie Overman, MD    Family History Family History  Problem Relation Age of Onset  . Hypertension Father   . Diabetes Father   . Stroke Father   . Hypertension Mother   . Stroke Mother   . Cancer Mother        cervical ca  . Cancer Sister 43       uterine ca  . Cancer Maternal Grandmother        lung ca    Social History Social History   Tobacco Use  . Smoking status: Former Smoker    Packs/day: 0.50    Years: 10.00    Pack years: 5.00    Types: Cigarettes    Last attempt to quit: 02/22/1998    Years since quitting: 19.8  . Smokeless tobacco: Never Used  Substance Use Topics  . Alcohol use: No  . Drug use: No     Allergies   Patient has no known allergies.   Review of Systems Review of Systems  Constitutional: Positive for fatigue. Negative for chills and fever.  Respiratory: Negative for shortness of breath.   Cardiovascular: Negative for chest pain.  Gastrointestinal: Positive for abdominal pain, diarrhea, nausea and vomiting. Negative for blood in stool.  Genitourinary: Positive for decreased urine volume. Negative for dysuria, frequency and hematuria.  All other systems reviewed and are negative.    Physical Exam Updated Vital Signs BP (!) 143/86   Pulse 97   Temp 98.3 F (36.8 C) (Oral)   Resp (!) 22   Ht 5\' 7"  (1.702 m)   Wt 99.8 kg   LMP 02/12/2012   SpO2 94%   BMI 34.46 kg/m   Physical Exam  Constitutional: She appears well-developed and well-nourished. No distress.  HENT:  Head: Normocephalic and atraumatic.  Eyes: Conjunctivae are normal. Right eye exhibits no discharge. Left eye exhibits no discharge.  Neck: Normal range of motion. Neck supple. No JVD present. No tracheal deviation present.  Cardiovascular: Intact distal pulses.  Tachycardic, 2+ radial and DP/PT pulses bilaterally, no lower  extremity edema.  Compartments are soft  Pulmonary/Chest: Effort normal and breath sounds normal.  Port-A-Cath to the right anterior chest wall with no surrounding erythema or induration.  Abdominal: Soft. Bowel sounds are normal. She exhibits no distension. There is tenderness in the epigastric area. There is no guarding.  Musculoskeletal: She exhibits no edema or tenderness.  Neurological: She is alert.  Fluent speech with no evidence of dysarthria or aphasia.  No facial droop.  Mildly confused but able to provide a coherent history.  Oriented to person place and day of the week but does not know the year.  No pronator drift.  4+/5 strength of BUE and BLE major muscle groups.  Skin: Skin is warm and dry. No erythema.  Ecchymosis to the abdomen and days status post to mechanical fall 1 week ago.  Psychiatric: She has a normal mood and affect. Her behavior is normal.  Nursing note and vitals reviewed.    ED Treatments / Results  Labs (all labs ordered are listed, but only abnormal results are displayed) Labs Reviewed  CBC - Abnormal; Notable for the following components:      Result Value   WBC 24.1 (*)    RBC 3.03 (*)    Hemoglobin 8.0 (*)    HCT 27.2 (*)    MCHC 29.4 (*)    RDW 20.6 (*)    Platelets 22 (*)    All other components within normal limits  COMPREHENSIVE METABOLIC PANEL - Abnormal; Notable for the following components:   Sodium 128 (*)    Potassium 3.3 (*)    Chloride 93 (*)    Glucose, Bld 128 (*)    Calcium 8.2 (*)    Total Protein 5.8 (*)    Albumin 2.0 (*)    AST 47 (*)    Alkaline Phosphatase 168 (*)    Total Bilirubin 1.4 (*)    All other components within normal limits  CBG MONITORING, ED - Abnormal; Notable for the following components:   Glucose-Capillary 126 (*)    All other components within normal limits  LIPASE, BLOOD  URINALYSIS, ROUTINE W REFLEX MICROSCOPIC  DIC (DISSEMINATED INTRAVASCULAR COAGULATION) PANEL  I-STAT BETA HCG BLOOD, ED ([facility], [facility],  [facility] ONLY)    EKG None  Radiology Dg Chest 2 View  Result Date: 12/24/2017 CLINICAL DATA:  Dizziness and lethargy EXAM: CHEST - 2 VIEW COMPARISON:  November 15, 2017 FINDINGS: Stable right Port-A-Cath. Elevation the right hemidiaphragm has worsened in the interval. The cardiomediastinal silhouette is normal. No other acute abnormalities. IMPRESSION: Elevation of the right hemidiaphragm, increased in the interval. No other acute abnormalities. Electronically Signed   By: Dorise Bullion III M.D   On: 12/24/2017 22:25   Ct Chest W Contrast  Result Date: 12/24/2017 CLINICAL DATA:  Metastatic cervical cancer.  Restaging. EXAM: CT CHEST, ABDOMEN, AND PELVIS WITH CONTRAST TECHNIQUE: Multidetector CT imaging of the chest, abdomen and pelvis was performed following the standard protocol during bolus administration of intravenous contrast. CONTRAST:  145mL OMNIPAQUE IOHEXOL 300 MG/ML  SOLN COMPARISON:  None. CT AP 10/20/2017 and CT chest 03/03/2017 FINDINGS: CT CHEST FINDINGS Cardiovascular: There is a right chest wall port a catheter with tip at the cavoatrial junction. Normal heart size. No pericardial effusion. Mediastinum/Nodes: No enlarged mediastinal, hilar, or axillary lymph nodes. Small cardio phrenic angle lymph nodes appear increased from previous exam. The largest measures 1 cm, image 40/2. Previously 4 mm. Thyroid gland, trachea, and esophagus demonstrate no significant findings. Lungs/Pleura: There is a new small right pleural effusion. Asymmetric elevation of the right hemidiaphragm is again noted. There is overlying passive atelectasis and volume loss from the right lower lobe. Right apical lung nodule measures 5 mm and is new from previous exam, image 21/4. Tiny nodule within the medial right lower lobe is also new measuring 3 mm, image 75/4. Musculoskeletal: No chest wall mass or suspicious bone lesions identified. CT ABDOMEN PELVIS FINDINGS Hepatobiliary: Extensive liver metastases involving  both lobes of the liver are identified the confluent mass within the right lobe of liver measures 14.7 x 6.6 by 18.7 cm, image 54/2. On the previous exam this measured 11.8 x 5.2 by 11.8 cm. Posterior segment 8 lesion measures 4.9 by 3.2 cm, image 47/2. Previously 3.6 x 2.5 cm. Multiple new lesions are identified. Lesion within segment 6 appears new measuring 2.7 x 2.4 cm, image 72/2. Lesion within the subcapsular left lobe is new measuring 3.8 x 2.8 cm, image 54/2. Mild pericholecystic fluid noted. No gallstones. No biliary dilatation. Pancreas: Unremarkable. No pancreatic ductal dilatation or surrounding inflammatory changes. Spleen: There are multiple new wedge-shaped areas of peripheral low attenuation within the spleen compatible with splenic infarcts. Adrenals/Urinary Tract: The adrenal glands appear normal. New large area of low attenuation within the inferior pole of left kidney is new measuring 4.2 by 3.1 by 4.5 cm. Wedge-shaped area of low attenuation and cortical volume loss within the posterior right kidney is new compatible with infarct, image 68/2. No hydronephrosis. Urinary bladder appears normal. Stomach/Bowel: Stomach is normal. No abnormal small bowel dilatation identified. No pathologic dilatation of the colon. Vascular/Lymphatic: Aortic atherosclerosis. Small retroperitoneal lymph nodes are identified no adenopathy within the abdomen. No pelvic or inguinal adenopathy. Reproductive: Anteverted uterus.  No discrete mass identified. Other: Enhancing lesion within the left pelvic sidewall is again noted measuring 4.9 by 1.9 cm, image 108/2. Interval development of moderate volume ascites. New soft tissue peritoneal nodule in the right upper quadrant of the abdomen measures 1.6 cm, image 77/2. New loculated ascites overlies the right lobe of liver, image 47/2. Musculoskeletal: Mild erosive changes involving the left sacrum secondary to the left pelvic sidewall mass identified, image 124/5. IMPRESSION:  1. Interval progression of disease. 2. Significant progression of liver metastasis. 3. New abdominal ascites  with loculated fluid overlying the liver and at least 1 peritoneal nodule in the right upper quadrant of the abdomen compatible with peritoneal carcinomatosis. 4. New pulmonary nodule in the right upper lobe and right lower lobe compatible with metastatic disease. 5. Multiple splenic and renal infarcts.  New from previous exam. 6. Similar appearance of left pelvic sidewall tumor with mild erosive changes involving the left sacrum. 7. These results will be called to the ordering clinician or representative by the Radiologist Assistant, and communication documented in the PACS or zVision Dashboard. Electronically Signed   By: Kerby Moors M.D.   On: 12/24/2017 03:02   Ct Abdomen Pelvis W Contrast  Result Date: 12/24/2017 CLINICAL DATA:  Metastatic cervical cancer.  Restaging. EXAM: CT CHEST, ABDOMEN, AND PELVIS WITH CONTRAST TECHNIQUE: Multidetector CT imaging of the chest, abdomen and pelvis was performed following the standard protocol during bolus administration of intravenous contrast. CONTRAST:  142mL OMNIPAQUE IOHEXOL 300 MG/ML  SOLN COMPARISON:  None. CT AP 10/20/2017 and CT chest 03/03/2017 FINDINGS: CT CHEST FINDINGS Cardiovascular: There is a right chest wall port a catheter with tip at the cavoatrial junction. Normal heart size. No pericardial effusion. Mediastinum/Nodes: No enlarged mediastinal, hilar, or axillary lymph nodes. Small cardio phrenic angle lymph nodes appear increased from previous exam. The largest measures 1 cm, image 40/2. Previously 4 mm. Thyroid gland, trachea, and esophagus demonstrate no significant findings. Lungs/Pleura: There is a new small right pleural effusion. Asymmetric elevation of the right hemidiaphragm is again noted. There is overlying passive atelectasis and volume loss from the right lower lobe. Right apical lung nodule measures 5 mm and is new from previous  exam, image 21/4. Tiny nodule within the medial right lower lobe is also new measuring 3 mm, image 75/4. Musculoskeletal: No chest wall mass or suspicious bone lesions identified. CT ABDOMEN PELVIS FINDINGS Hepatobiliary: Extensive liver metastases involving both lobes of the liver are identified the confluent mass within the right lobe of liver measures 14.7 x 6.6 by 18.7 cm, image 54/2. On the previous exam this measured 11.8 x 5.2 by 11.8 cm. Posterior segment 8 lesion measures 4.9 by 3.2 cm, image 47/2. Previously 3.6 x 2.5 cm. Multiple new lesions are identified. Lesion within segment 6 appears new measuring 2.7 x 2.4 cm, image 72/2. Lesion within the subcapsular left lobe is new measuring 3.8 x 2.8 cm, image 54/2. Mild pericholecystic fluid noted. No gallstones. No biliary dilatation. Pancreas: Unremarkable. No pancreatic ductal dilatation or surrounding inflammatory changes. Spleen: There are multiple new wedge-shaped areas of peripheral low attenuation within the spleen compatible with splenic infarcts. Adrenals/Urinary Tract: The adrenal glands appear normal. New large area of low attenuation within the inferior pole of left kidney is new measuring 4.2 by 3.1 by 4.5 cm. Wedge-shaped area of low attenuation and cortical volume loss within the posterior right kidney is new compatible with infarct, image 68/2. No hydronephrosis. Urinary bladder appears normal. Stomach/Bowel: Stomach is normal. No abnormal small bowel dilatation identified. No pathologic dilatation of the colon. Vascular/Lymphatic: Aortic atherosclerosis. Small retroperitoneal lymph nodes are identified no adenopathy within the abdomen. No pelvic or inguinal adenopathy. Reproductive: Anteverted uterus.  No discrete mass identified. Other: Enhancing lesion within the left pelvic sidewall is again noted measuring 4.9 by 1.9 cm, image 108/2. Interval development of moderate volume ascites. New soft tissue peritoneal nodule in the right upper  quadrant of the abdomen measures 1.6 cm, image 77/2. New loculated ascites overlies the right lobe of liver, image 47/2. Musculoskeletal: Mild  erosive changes involving the left sacrum secondary to the left pelvic sidewall mass identified, image 124/5. IMPRESSION: 1. Interval progression of disease. 2. Significant progression of liver metastasis. 3. New abdominal ascites with loculated fluid overlying the liver and at least 1 peritoneal nodule in the right upper quadrant of the abdomen compatible with peritoneal carcinomatosis. 4. New pulmonary nodule in the right upper lobe and right lower lobe compatible with metastatic disease. 5. Multiple splenic and renal infarcts.  New from previous exam. 6. Similar appearance of left pelvic sidewall tumor with mild erosive changes involving the left sacrum. 7. These results will be called to the ordering clinician or representative by the Radiologist Assistant, and communication documented in the PACS or zVision Dashboard. Electronically Signed   By: Kerby Moors M.D.   On: 12/24/2017 03:02    Procedures Procedures (including critical care time)  Medications Ordered in ED Medications  0.9 %  sodium chloride infusion ( Intravenous New Bag/Given 12/24/17 2317)     Initial Impression / Assessment and Plan / ED Course  I have reviewed the triage vital signs and the nursing notes.  Pertinent labs & imaging results that were available during my care of the patient were reviewed by me and considered in my medical decision making (see chart for details).  Clinical Course as of Dec 25 2355  Sat Dec 24, 2017  2240 EKG is sinus tach at a rate of 117 normal intervals no acute ST-T changes.   [MB]  2301 Patient with known squamous cell metastatic cancer here with progressive weakness fatigue and confusion.  Consult is been going on over a month ago acutely worsened over the last 3 days.  She is having difficulty even with transfers and walking now.  She has not been  taking any solid food for a while.  She is getting some screening labs chest x-ray CT head and will likely need admission for continued management.   [MB]    Clinical Course User Index [MB] Hayden Rasmussen, MD    Patient presenting for evaluation of progressively worsening generalized weakness and new onset confusion over the past 3 days.  She is afebrile, tachycardic in the ED though she has a history of sinus tachycardia.  No focal neurologic deficits on examination.  Imaging of the chest abdomen and pelvis performed yesterday shows progression of her cancer with significant progression of liver metastases, new abdominal ascites consistent with peritoneal carcinomatosis, new pulmonary nodule, multiple splenic and renal infarcts.  Will obtain labs, chest x-ray, head CT for further evaluation.  11:41 PM Lab work thus far reviewed by me shows stable leukocytosis, stable H&H.  Ongoing hypokalemia, hyponatremia, findings consistent with malnutrition.  She does have an acute thrombocytopenia with a platelet count of 22. Signed out to oncoming provider PA [place]. Awaiting labwork, UA, and imaging. Anticipate admission for weakness, confusion, and thrombocytopenia.  Final Clinical Impressions(s) / ED Diagnoses   Final diagnoses:  Confusion  Generalized weakness  Thrombocytopenia [facility])    ED Discharge Orders    None       Renita Papa, PA-C 12/24/17 2358

## 2017-12-24 NOTE — ED Triage Notes (Signed)
Pt and husband report dizziness, lethargy and confusion for at least the last 3 days. She is a cancer patient with squamous cell carcinoma in her presacral area that metastasized to her liver. She reports being on Keytruda. No appetite. Denies pain. Endorses nausea and vomiting. Port was accessed today.

## 2017-12-24 NOTE — ED Notes (Signed)
Patient transported to CT 

## 2017-12-24 NOTE — ED Notes (Signed)
Rectal temp 100.8F

## 2017-12-25 DIAGNOSIS — D696 Thrombocytopenia, unspecified: Secondary | ICD-10-CM | POA: Diagnosis not present

## 2017-12-25 DIAGNOSIS — R0602 Shortness of breath: Secondary | ICD-10-CM | POA: Diagnosis not present

## 2017-12-25 DIAGNOSIS — E785 Hyperlipidemia, unspecified: Secondary | ICD-10-CM

## 2017-12-25 DIAGNOSIS — I6330 Cerebral infarction due to thrombosis of unspecified cerebral artery: Secondary | ICD-10-CM | POA: Diagnosis not present

## 2017-12-25 DIAGNOSIS — E871 Hypo-osmolality and hyponatremia: Secondary | ICD-10-CM | POA: Diagnosis not present

## 2017-12-25 DIAGNOSIS — K769 Liver disease, unspecified: Secondary | ICD-10-CM | POA: Diagnosis not present

## 2017-12-25 DIAGNOSIS — C801 Malignant (primary) neoplasm, unspecified: Secondary | ICD-10-CM

## 2017-12-25 DIAGNOSIS — N280 Ischemia and infarction of kidney: Secondary | ICD-10-CM | POA: Diagnosis present

## 2017-12-25 DIAGNOSIS — G9341 Metabolic encephalopathy: Secondary | ICD-10-CM | POA: Diagnosis not present

## 2017-12-25 DIAGNOSIS — R911 Solitary pulmonary nodule: Secondary | ICD-10-CM | POA: Diagnosis not present

## 2017-12-25 DIAGNOSIS — Z66 Do not resuscitate: Secondary | ICD-10-CM | POA: Diagnosis present

## 2017-12-25 DIAGNOSIS — F332 Major depressive disorder, recurrent severe without psychotic features: Secondary | ICD-10-CM

## 2017-12-25 DIAGNOSIS — Z6834 Body mass index (BMI) 34.0-34.9, adult: Secondary | ICD-10-CM

## 2017-12-25 DIAGNOSIS — B370 Candidal stomatitis: Secondary | ICD-10-CM | POA: Diagnosis not present

## 2017-12-25 DIAGNOSIS — C787 Secondary malignant neoplasm of liver and intrahepatic bile duct: Secondary | ICD-10-CM | POA: Diagnosis not present

## 2017-12-25 DIAGNOSIS — E43 Unspecified severe protein-calorie malnutrition: Secondary | ICD-10-CM | POA: Diagnosis present

## 2017-12-25 DIAGNOSIS — G893 Neoplasm related pain (acute) (chronic): Secondary | ICD-10-CM | POA: Diagnosis not present

## 2017-12-25 DIAGNOSIS — C539 Malignant neoplasm of cervix uteri, unspecified: Secondary | ICD-10-CM | POA: Diagnosis not present

## 2017-12-25 DIAGNOSIS — D735 Infarction of spleen: Secondary | ICD-10-CM | POA: Diagnosis present

## 2017-12-25 DIAGNOSIS — C786 Secondary malignant neoplasm of retroperitoneum and peritoneum: Secondary | ICD-10-CM | POA: Diagnosis present

## 2017-12-25 DIAGNOSIS — Z7189 Other specified counseling: Secondary | ICD-10-CM | POA: Diagnosis not present

## 2017-12-25 DIAGNOSIS — R410 Disorientation, unspecified: Secondary | ICD-10-CM | POA: Diagnosis not present

## 2017-12-25 DIAGNOSIS — I10 Essential (primary) hypertension: Secondary | ICD-10-CM | POA: Diagnosis not present

## 2017-12-25 DIAGNOSIS — Z515 Encounter for palliative care: Secondary | ICD-10-CM | POA: Diagnosis not present

## 2017-12-25 DIAGNOSIS — I480 Paroxysmal atrial fibrillation: Secondary | ICD-10-CM | POA: Diagnosis not present

## 2017-12-25 DIAGNOSIS — N200 Calculus of kidney: Secondary | ICD-10-CM | POA: Diagnosis not present

## 2017-12-25 DIAGNOSIS — G4733 Obstructive sleep apnea (adult) (pediatric): Secondary | ICD-10-CM | POA: Diagnosis present

## 2017-12-25 DIAGNOSIS — E876 Hypokalemia: Secondary | ICD-10-CM | POA: Diagnosis not present

## 2017-12-25 DIAGNOSIS — Z8673 Personal history of transient ischemic attack (TIA), and cerebral infarction without residual deficits: Secondary | ICD-10-CM | POA: Diagnosis not present

## 2017-12-25 DIAGNOSIS — R180 Malignant ascites: Secondary | ICD-10-CM | POA: Diagnosis not present

## 2017-12-25 DIAGNOSIS — T451X5A Adverse effect of antineoplastic and immunosuppressive drugs, initial encounter: Secondary | ICD-10-CM | POA: Diagnosis not present

## 2017-12-25 DIAGNOSIS — A419 Sepsis, unspecified organism: Secondary | ICD-10-CM | POA: Diagnosis not present

## 2017-12-25 DIAGNOSIS — D6481 Anemia due to antineoplastic chemotherapy: Secondary | ICD-10-CM | POA: Diagnosis not present

## 2017-12-25 DIAGNOSIS — D61818 Other pancytopenia: Secondary | ICD-10-CM | POA: Insufficient documentation

## 2017-12-25 DIAGNOSIS — D649 Anemia, unspecified: Secondary | ICD-10-CM | POA: Diagnosis not present

## 2017-12-25 DIAGNOSIS — R918 Other nonspecific abnormal finding of lung field: Secondary | ICD-10-CM | POA: Diagnosis present

## 2017-12-25 DIAGNOSIS — R188 Other ascites: Secondary | ICD-10-CM | POA: Diagnosis not present

## 2017-12-25 DIAGNOSIS — R627 Adult failure to thrive: Secondary | ICD-10-CM | POA: Diagnosis not present

## 2017-12-25 DIAGNOSIS — R531 Weakness: Secondary | ICD-10-CM

## 2017-12-25 DIAGNOSIS — C7951 Secondary malignant neoplasm of bone: Secondary | ICD-10-CM | POA: Diagnosis not present

## 2017-12-25 DIAGNOSIS — N179 Acute kidney failure, unspecified: Secondary | ICD-10-CM | POA: Diagnosis not present

## 2017-12-25 DIAGNOSIS — J90 Pleural effusion, not elsewhere classified: Secondary | ICD-10-CM | POA: Diagnosis not present

## 2017-12-25 DIAGNOSIS — K219 Gastro-esophageal reflux disease without esophagitis: Secondary | ICD-10-CM

## 2017-12-25 LAB — DIC (DISSEMINATED INTRAVASCULAR COAGULATION) PANEL (NOT AT ARMC)
D-Dimer, Quant: 14.95 ug/mL-FEU — ABNORMAL HIGH (ref 0.00–0.50)
Fibrinogen: 226 mg/dL (ref 210–475)
Prothrombin Time: 35.8 seconds — ABNORMAL HIGH (ref 11.4–15.2)

## 2017-12-25 LAB — URINALYSIS, MICROSCOPIC (REFLEX)
RBC / HPF: NONE SEEN RBC/hpf (ref 0–5)
Squamous Epithelial / LPF: NONE SEEN (ref 0–5)
WBC UA: NONE SEEN WBC/hpf (ref 0–5)

## 2017-12-25 LAB — COMPREHENSIVE METABOLIC PANEL
ALBUMIN: 1.7 g/dL — AB (ref 3.5–5.0)
ALT: 20 U/L (ref 0–44)
ANION GAP: 8 (ref 5–15)
AST: 39 U/L (ref 15–41)
Alkaline Phosphatase: 138 U/L — ABNORMAL HIGH (ref 38–126)
BILIRUBIN TOTAL: 1.4 mg/dL — AB (ref 0.3–1.2)
BUN: 9 mg/dL (ref 6–20)
CO2: 24 mmol/L (ref 22–32)
Calcium: 7.7 mg/dL — ABNORMAL LOW (ref 8.9–10.3)
Chloride: 98 mmol/L (ref 98–111)
Creatinine, Ser: 0.5 mg/dL (ref 0.44–1.00)
GFR calc non Af Amer: >60 mL/min (ref >60)
GLUCOSE: 145 mg/dL — AB (ref 70–99)
POTASSIUM: 3.3 mmol/L — AB (ref 3.5–5.1)
Sodium: 130 mmol/L — ABNORMAL LOW (ref 135–145)
TOTAL PROTEIN: 5 g/dL — AB (ref 6.5–8.1)

## 2017-12-25 LAB — URINALYSIS, ROUTINE W REFLEX MICROSCOPIC
Glucose, UA: 100 mg/dL — AB
Hgb urine dipstick: NEGATIVE
Ketones, ur: NEGATIVE mg/dL
LEUKOCYTES UA: NEGATIVE
Nitrite: NEGATIVE
PROTEIN: NEGATIVE mg/dL
SPECIFIC GRAVITY, URINE: 1.02 (ref 1.005–1.030)
pH: 6 (ref 5.0–8.0)

## 2017-12-25 LAB — RETIC PANEL
Immature Retic Fract: 38.3 % — ABNORMAL HIGH (ref 2.3–15.9)
RBC.: 2.85 MIL/uL — AB (ref 3.87–5.11)
RETIC CT PCT: 3.6 % — AB (ref 0.4–3.1)
Retic Count, Absolute: 101.2 10*3/uL (ref 19.0–186.0)
Reticulocyte Hemoglobin: 29.7 pg (ref >27.9)

## 2017-12-25 LAB — PREPARE RBC (CROSSMATCH)

## 2017-12-25 LAB — DIC (DISSEMINATED INTRAVASCULAR COAGULATION)PANEL
INR: 3.66
INR: 3.1
Platelets: 27 10*3/uL — CL (ref 150–400)
Prothrombin Time: 31.5 seconds — ABNORMAL HIGH (ref 11.4–15.2)
Smear Review: NONE SEEN
Smear Review: NONE SEEN
aPTT: 55 seconds — ABNORMAL HIGH (ref 24–36)
aPTT: 45 seconds — ABNORMAL HIGH (ref 24–36)

## 2017-12-25 LAB — C DIFFICILE QUICK SCREEN W PCR REFLEX
C DIFFICILE (CDIFF) INTERP: NOT DETECTED
C Diff antigen: NEGATIVE
C Diff toxin: NEGATIVE

## 2017-12-25 LAB — CBC
HCT: 27.2 % — ABNORMAL LOW (ref 36.0–46.0)
HCT: 23.3 % — ABNORMAL LOW (ref 36.0–46.0)
Hemoglobin: 8 g/dL — ABNORMAL LOW (ref 12.0–15.0)
Hemoglobin: 6.9 g/dL — CL (ref 12.0–15.0)
MCH: 26.4 pg (ref 26.0–34.0)
MCH: 26.6 pg (ref 26.0–34.0)
MCHC: 29.4 g/dL — AB (ref 30.0–36.0)
MCHC: 29.6 g/dL — AB (ref 30.0–36.0)
MCV: 89.8 fL (ref 80.0–100.0)
MCV: 90 fL (ref 80.0–100.0)
PLATELETS: DECREASED 10*3/uL (ref 150–400)
Platelets: 30 10*3/uL — ABNORMAL LOW (ref 150–400)
RBC: 3.03 MIL/uL — ABNORMAL LOW (ref 3.87–5.11)
RBC: 2.59 MIL/uL — ABNORMAL LOW (ref 3.87–5.11)
RDW: 20.6 % — AB (ref 11.5–15.5)
RDW: 20.5 % — ABNORMAL HIGH (ref 11.5–15.5)
WBC: 24.1 10*3/uL — AB (ref 4.0–10.5)
WBC: 16.2 10*3/uL — ABNORMAL HIGH (ref 4.0–10.5)
nRBC: 0.1 % (ref 0.0–0.2)
nRBC: 0 % (ref 0.0–0.2)

## 2017-12-25 LAB — APTT: APTT: 51 s — AB (ref 24–36)

## 2017-12-25 LAB — DIFFERENTIAL
BAND NEUTROPHILS: 0 %
Basophils Absolute: 0 10*3/uL (ref 0.0–0.1)
Basophils Relative: 0 %
Blasts: 0 %
EOS ABS: 0 10*3/uL (ref 0.0–0.5)
Eosinophils Relative: 0 %
Lymphocytes Relative: 8 %
Lymphs Abs: 1.3 10*3/uL (ref 0.7–4.0)
METAMYELOCYTES PCT: 0 %
MONO ABS: 0.8 10*3/uL (ref 0.1–1.0)
MONOS PCT: 5 %
MYELOCYTES: 0 %
NEUTROS ABS: 14.1 10*3/uL — AB (ref 1.7–7.7)
NRBC: 0 /100{WBCs}
Neutrophils Relative %: 87 %
OTHER: 0 %
Promyelocytes Relative: 0 %

## 2017-12-25 LAB — MAGNESIUM: MAGNESIUM: 1.3 mg/dL — AB (ref 1.7–2.4)

## 2017-12-25 LAB — SAVE SMEAR(SSMR), FOR PROVIDER SLIDE REVIEW

## 2017-12-25 LAB — GLUCOSE, CAPILLARY
GLUCOSE-CAPILLARY: 170 mg/dL — AB (ref 70–99)
GLUCOSE-CAPILLARY: 161 mg/dL — AB (ref 70–99)
GLUCOSE-CAPILLARY: 129 mg/dL — AB (ref 70–99)

## 2017-12-25 LAB — LACTIC ACID, PLASMA
LACTIC ACID, VENOUS: 1.8 mmol/L (ref 0.5–1.9)
LACTIC ACID, VENOUS: 1.7 mmol/L (ref 0.5–1.9)

## 2017-12-25 LAB — LACTATE DEHYDROGENASE: LDH: 536 U/L — ABNORMAL HIGH (ref 98–192)

## 2017-12-25 LAB — DIC (DISSEMINATED INTRAVASCULAR COAGULATION) PANEL
D DIMER QUANT: 16.01 ug{FEU}/mL — AB (ref 0.00–0.50)
FIBRINOGEN: 221 mg/dL (ref 210–475)
PLATELETS: 40 10*3/uL — AB (ref 150–400)

## 2017-12-25 LAB — TSH
TSH: 2.876 u[IU]/mL (ref 0.350–4.500)
TSH: 2.932 u[IU]/mL (ref 0.350–4.500)

## 2017-12-25 LAB — VITAMIN B12: Vitamin B-12: >7500 pg/mL — ABNORMAL HIGH (ref 180–914)

## 2017-12-25 LAB — AMMONIA: AMMONIA: 19 umol/L (ref 9–35)

## 2017-12-25 LAB — DIRECT ANTIGLOBULIN TEST (NOT AT ARMC)
DAT, IgG: NEGATIVE
DAT, complement: NEGATIVE

## 2017-12-25 LAB — ABO/RH: ABO/RH(D): A POS

## 2017-12-25 LAB — PROCALCITONIN: PROCALCITONIN: 0.85 ng/mL

## 2017-12-25 LAB — FOLATE: Folate: 4.2 ng/mL — ABNORMAL LOW (ref >5.9)

## 2017-12-25 MED ORDER — VANCOMYCIN HCL 10 G IV SOLR
2000.0000 mg | Freq: Once | INTRAVENOUS | Status: AC
  Administered 2017-12-25: 2000 mg via INTRAVENOUS
  Filled 2017-12-25: qty 2000

## 2017-12-25 MED ORDER — FLECAINIDE ACETATE 50 MG PO TABS
50.0000 mg | ORAL_TABLET | Freq: Every day | ORAL | Status: DC
  Administered 2017-12-25 – 2017-12-31 (×5): 50 mg via ORAL
  Filled 2017-12-25 (×14): qty 1

## 2017-12-25 MED ORDER — LIDOCAINE-PRILOCAINE 2.5-2.5 % EX CREA: 1.0000 "application " | TOPICAL_CREAM | Freq: Every day | CUTANEOUS | Status: DC | PRN

## 2017-12-25 MED ORDER — POTASSIUM CHLORIDE IN NACL 20-0.9 MEQ/L-% IV SOLN
INTRAVENOUS | Status: DC
  Administered 2017-12-25 – 2017-12-28 (×5): via INTRAVENOUS
  Filled 2017-12-25 (×6): qty 1000

## 2017-12-25 MED ORDER — PREMIER PROTEIN SHAKE
11.0000 [oz_av] | Freq: Two times a day (BID) | ORAL | Status: DC
  Administered 2017-12-25 – 2017-12-28 (×3): 11 [oz_av] via ORAL
  Filled 2017-12-25 (×26): qty 325.31

## 2017-12-25 MED ORDER — POTASSIUM CHLORIDE 10 MEQ/100ML IV SOLN
10.0000 meq | INTRAVENOUS | Status: AC
  Administered 2017-12-25 – 2017-12-26 (×4): 10 meq via INTRAVENOUS
  Filled 2017-12-25 (×2): qty 100

## 2017-12-25 MED ORDER — ONDANSETRON HCL 4 MG/2ML IJ SOLN
4.0000 mg | Freq: Four times a day (QID) | INTRAMUSCULAR | Status: DC | PRN
  Administered 2017-12-25: 4 mg via INTRAVENOUS
  Filled 2017-12-25: qty 2

## 2017-12-25 MED ORDER — PROMETHAZINE HCL 25 MG/ML IJ SOLN
12.5000 mg | Freq: Four times a day (QID) | INTRAMUSCULAR | Status: DC | PRN
  Administered 2017-12-25 – 2018-01-04 (×4): 12.5 mg via INTRAVENOUS
  Filled 2017-12-25 (×5): qty 1

## 2017-12-25 MED ORDER — INSULIN ASPART 100 UNIT/ML ~~LOC~~ SOLN
0.0000 [IU] | Freq: Three times a day (TID) | SUBCUTANEOUS | Status: DC
  Administered 2017-12-25: 2 [IU] via SUBCUTANEOUS
  Administered 2017-12-26 (×2): 1 [IU] via SUBCUTANEOUS
  Administered 2017-12-27: 2 [IU] via SUBCUTANEOUS
  Administered 2017-12-27: 3 [IU] via SUBCUTANEOUS
  Administered 2017-12-27 – 2017-12-28 (×3): 2 [IU] via SUBCUTANEOUS

## 2017-12-25 MED ORDER — HYDRALAZINE HCL 20 MG/ML IJ SOLN: 5.0000 mg | INTRAMUSCULAR | Status: DC | PRN

## 2017-12-25 MED ORDER — CITALOPRAM HYDROBROMIDE 10 MG PO TABS
20.0000 mg | ORAL_TABLET | Freq: Every day | ORAL | Status: DC
  Administered 2017-12-25 – 2017-12-28 (×3): 20 mg via ORAL
  Filled 2017-12-25 (×4): qty 2

## 2017-12-25 MED ORDER — POTASSIUM CHLORIDE 20 MEQ/15ML (10%) PO SOLN
40.0000 meq | Freq: Once | ORAL | Status: AC
  Administered 2017-12-25: 40 meq via ORAL
  Filled 2017-12-25: qty 30

## 2017-12-25 MED ORDER — SODIUM CHLORIDE 0.9 % IV SOLN
INTRAVENOUS | Status: DC
  Administered 2017-12-25: 04:00:00 via INTRAVENOUS

## 2017-12-25 MED ORDER — VANCOMYCIN HCL IN DEXTROSE 750-5 MG/150ML-% IV SOLN
750.0000 mg | Freq: Two times a day (BID) | INTRAVENOUS | Status: DC
  Administered 2017-12-25 – 2017-12-26 (×2): 750 mg via INTRAVENOUS
  Filled 2017-12-25 (×2): qty 150

## 2017-12-25 MED ORDER — PIPERACILLIN-TAZOBACTAM 3.375 G IVPB
3.3750 g | Freq: Three times a day (TID) | INTRAVENOUS | Status: DC
  Administered 2017-12-25 – 2017-12-30 (×16): 3.375 g via INTRAVENOUS
  Filled 2017-12-25 (×17): qty 50

## 2017-12-25 MED ORDER — IBUPROFEN 200 MG PO TABS: 200.0000 mg | ORAL_TABLET | Freq: Four times a day (QID) | ORAL | Status: DC | PRN

## 2017-12-25 MED ORDER — ZOLPIDEM TARTRATE 5 MG PO TABS: 5.0000 mg | ORAL_TABLET | Freq: Every evening | ORAL | Status: DC | PRN

## 2017-12-25 MED ORDER — SODIUM CHLORIDE 0.9% FLUSH
10.0000 mL | INTRAVENOUS | Status: DC | PRN
  Administered 2017-12-26 – 2018-01-06 (×4): 10 mL
  Filled 2017-12-25 (×4): qty 40

## 2017-12-25 MED ORDER — PIPERACILLIN-TAZOBACTAM 3.375 G IVPB 30 MIN
3.3750 g | INTRAVENOUS | Status: AC
  Administered 2017-12-25: 3.375 g via INTRAVENOUS
  Filled 2017-12-25: qty 50

## 2017-12-25 MED ORDER — VANCOMYCIN HCL IN DEXTROSE 750-5 MG/150ML-% IV SOLN
750.0000 mg | Freq: Three times a day (TID) | INTRAVENOUS | Status: DC
  Filled 2017-12-25: qty 150

## 2017-12-25 MED ORDER — PANTOPRAZOLE SODIUM 40 MG PO TBEC
40.0000 mg | DELAYED_RELEASE_TABLET | Freq: Every day | ORAL | Status: DC
  Administered 2017-12-25 – 2018-01-01 (×6): 40 mg via ORAL
  Filled 2017-12-25 (×7): qty 1

## 2017-12-25 MED ORDER — DRONABINOL 2.5 MG PO CAPS
2.5000 mg | ORAL_CAPSULE | Freq: Two times a day (BID) | ORAL | Status: DC | PRN
  Filled 2017-12-25: qty 1

## 2017-12-25 MED ORDER — SODIUM CHLORIDE 0.9% IV SOLUTION
Freq: Once | INTRAVENOUS | Status: AC
  Administered 2017-12-25: 12:00:00 via INTRAVENOUS

## 2017-12-25 MED ORDER — ONDANSETRON HCL 4 MG/2ML IJ SOLN
4.0000 mg | Freq: Four times a day (QID) | INTRAMUSCULAR | Status: DC
  Administered 2017-12-25 – 2017-12-26 (×5): 4 mg via INTRAVENOUS
  Filled 2017-12-25 (×5): qty 2

## 2017-12-25 MED ORDER — VITAMIN D3 25 MCG (1000 UNIT) PO TABS
2000.0000 [IU] | ORAL_TABLET | Freq: Every day | ORAL | Status: DC
  Administered 2017-12-25: 2000 [IU] via ORAL
  Filled 2017-12-25 (×2): qty 2

## 2017-12-25 MED ORDER — ENSURE ENLIVE PO LIQD
237.0000 mL | Freq: Two times a day (BID) | ORAL | Status: DC
  Administered 2017-12-27: 237 mL via ORAL

## 2017-12-25 MED ORDER — MAGNESIUM OXIDE 400 (241.3 MG) MG PO TABS
400.0000 mg | ORAL_TABLET | Freq: Two times a day (BID) | ORAL | Status: DC
  Administered 2017-12-25 (×3): 400 mg via ORAL
  Filled 2017-12-25 (×4): qty 1

## 2017-12-25 MED ORDER — FOLIC ACID 1 MG PO TABS
1.0000 mg | ORAL_TABLET | Freq: Two times a day (BID) | ORAL | Status: DC
  Administered 2017-12-25: 1 mg via ORAL
  Filled 2017-12-25 (×2): qty 1

## 2017-12-25 MED ORDER — ONDANSETRON HCL 4 MG PO TABS: 4.0000 mg | ORAL_TABLET | Freq: Four times a day (QID) | ORAL | Status: DC | PRN

## 2017-12-25 MED ORDER — SODIUM CHLORIDE 0.9 % IV BOLUS
2000.0000 mL | Freq: Once | INTRAVENOUS | Status: AC
  Administered 2017-12-25: 2000 mL via INTRAVENOUS

## 2017-12-25 MED ORDER — MAGNESIUM SULFATE 2 GM/50ML IV SOLN
2.0000 g | Freq: Once | INTRAVENOUS | Status: AC
  Administered 2017-12-25: 2 g via INTRAVENOUS
  Filled 2017-12-25: qty 50

## 2017-12-25 NOTE — Progress Notes (Signed)
PROGRESS NOTE  Cindy Faulkner DPO:[id number] DOB: [birth year] DOA: 12/24/2017 PCP: Lennie Odor, PA-C  Brief History:   52 y.o. female with medical history significant of metastatic squamous cell carcinoma involving liver & sacrum with unknown primary site (stoped chemo in May), hypertension, hyperlipidemia, stroke, GERD, depression, atrial fibrillation on Xarelto, who presents with altered mental status.  According to the patient's husband, the patient has had a functional decline over the past 2 weeks, now having difficulty getting out of bed without assistance.  In the past 2 to 3 days prior to admission, the patient has had worsening oral intake and increasing lethargy.  The patient has had on and off low-grade temperatures for the past week with a T-max of 101.0 F approximately 3 days prior to this admission.  There is been no headaches, neck pain, chest pain, coughing, hemoptysis, diarrhea, dysuria, hematuria, hematochezia, melena.  Upon presentation, the patient was noted to have sodium 128 and W BC 24.1 and hemoglobin 8.0.  There is concern for infectious source.  Patient was started on empiric vancomycin and Zosyn.  Assessment/Plan: Acute metabolic encephalopathy -Multifactorial including infectious etiology, drop in hemoglobin, hyponatremia, dehydration, and possible metastasis to the brain. Cancer of unknown origin ([place]) -Continue empiric vancomycin and Zosyn pending culture data -Urinalysis negative for pyuria -Chest x-ray personally reviewed--no consolidations -Personally reviewed EKG--sinus rhythm, no ST-T wave changes -Serum E31, folic acid -TSH 5.400 -MR brain r/o mets  Coagulopathy/Thrombocytopenia -Likely due to sepsis resulting in DIC -Presenting INR 3.66, PTT 51, fibrinogen 226 -D-dimer 14.95 -Consulted hematology/oncology for concerns of possible TTP--discussed with Dr. Derrill Center -Transfuse 2 units PRBC  Intractable nausea and vomiting -12/23/2017 CT abdomen  and pelvis--interval progression of malignancy with progression of liver metastasis; new abdominal ascites with loculated fluid over the liver and peritoneal nodule in the right upper quadrant; new right upper lobe and right lower lobe lung nodule, multiple splenic and renal infarcts -Zofran around-the-clock -Phenergan as needed nausea and vomiting -Lipase 18  Cancer of unknown origin ([place]) -follows Dr. Alvy Bimler -mets to liver and sacrum -last Keytruda 2-3 weeks prior to admission  Paroxysmal atrial fibrillation -Holding rivaroxaban secondary to thrombocytopenia -Continue flecainide -Presently in sinus rhythm  Hypokalemia/Hypomagnesemia -replete  Hyponatremia -due to poor solute intake and dehydration -continue IVF  Anemia due to antineoplastic chemotherapy This is likely anemia of chronic disease. The patient denies recent history of bleeding such as epistaxis, hematuria or hematochezia.  MDD (major depressive disorder), recurrent severe, without psychosis ([place]): -Continue Celexa  Severe protein-calorie malnutrition ([place]): -Protein supplement    Disposition Plan:  Not stable for d/c Family Communication:   Spouse updated at bedside 11/3  Consultants:   Med Onc--[place]  Code Status:  FULL  DVT Prophylaxis:  SCDs   Procedures: As Listed in Progress Note Above  Antibiotics: vanco 11/2>>> Zosyn 11/2>>>    Subjective: Patient continues to have nausea and vomiting.  She denies any chest pain, shortness breath, headache, neck pain, dysuria, hematuria.  She has some epigastric pain.  There is no dysuria, hematuria, hematochezia, melena.  Objective: Vitals:   12/25/17 0100 12/25/17 0130 12/25/17 0230 12/25/17 0347  BP: 139/83 (!) 142/84 (!) 140/91 139/77  Pulse: 97 96 96 93  Resp: 14 16 (!) 0 (!) 22  Temp:    98.2 F (36.8 C)  TempSrc:    Oral  SpO2: 94% 93% 93% 94%  Weight:      Height:  Intake/Output Summary (Last 24 hours) at 12/25/2017 1110 Last  data filed at 12/25/2017 1000 Gross per 24 hour  Intake 1120 ml  Output -  Net 1120 ml   Weight change:  Exam:   General:  Pt is alert, follows commands appropriately, not in acute distress  HEENT: No icterus, No thrush, No neck mass, [place]/AT  Cardiovascular: RRR, S1/S2, no rubs, no gallops  Respiratory: CTA bilaterally, no wheezing, no crackles, no rhonchi  Abdomen: Soft/+BS, epigastric tender, non distended, no guarding  Extremities: No edema, No lymphangitis, No petechiae, No rashes, no synovitis   Data Reviewed: I have personally reviewed following labs and imaging studies Basic Metabolic Panel: Recent Labs  Lab 12/24/17 2141 12/25/17 0245 12/25/17 0619  NA 128*  --  130*  K 3.3*  --  3.3*  CL 93*  --  98  CO2 24  --  24  GLUCOSE 128*  --  145*  BUN 9  --  9  CREATININE 0.58  --  0.50  CALCIUM 8.2*  --  7.7*  MG  --  1.3*  --    Liver Function Tests: Recent Labs  Lab 12/24/17 2141 12/25/17 0619  AST 47* 39  ALT 24 20  ALKPHOS 168* 138*  BILITOT 1.4* 1.4*  PROT 5.8* 5.0*  ALBUMIN 2.0* 1.7*   Recent Labs  Lab 12/24/17 2141  LIPASE 18   No results for input(s): AMMONIA in the last 168 hours. Coagulation Profile: Recent Labs  Lab 12/25/17 0047  INR 3.66   CBC: Recent Labs  Lab 12/24/17 2107 12/25/17 0047 12/25/17 0619  WBC 24.1*  --  16.2*  HGB 8.0*  --  6.9*  HCT 27.2*  --  23.3*  MCV 89.8  --  90.0  PLT PLATELET CLUMPS NOTED ON SMEAR, COUNT APPEARS DECREASED 27* 30*   Cardiac Enzymes: No results for input(s): CKTOTAL, CKMB, CKMBINDEX, TROPONINI in the last 168 hours. BNP: Invalid input(s): POCBNP CBG: Recent Labs  Lab 12/24/17 2245  GLUCAP 126*   HbA1C: No results for input(s): HGBA1C in the last 72 hours. Urine analysis:    Component Value Date/Time   COLORURINE YELLOW 11/15/2017 1723   APPEARANCEUR CLEAR 11/15/2017 1723   LABSPEC 1.005 11/15/2017 1723   PHURINE 6.0 11/15/2017 1723   GLUCOSEU NEGATIVE 11/15/2017 1723    HGBUR NEGATIVE 11/15/2017 1723   BILIRUBINUR NEGATIVE 11/15/2017 1723   KETONESUR 20 (A) 11/15/2017 1723   PROTEINUR NEGATIVE 11/15/2017 1723   UROBILINOGEN 0.2 11/27/2010 1122   NITRITE NEGATIVE 11/15/2017 1723   LEUKOCYTESUR NEGATIVE 11/15/2017 1723   Sepsis Labs: @LABRCNTIP (procalcitonin:4,lacticidven:4) )No results found for this or any previous visit (from the past 240 hour(s)).   Scheduled Meds: . sodium chloride   Intravenous Once  . cholecalciferol  2,000 Units Oral Daily  . citalopram  20 mg Oral Daily  . feeding supplement (ENSURE ENLIVE)  237 mL Oral BID BM  . flecainide  50 mg Oral Daily  . magnesium oxide  400 mg Oral BID  . pantoprazole  40 mg Oral Daily  . protein supplement shake  11 oz Oral BID BM   Continuous Infusions: . sodium chloride 100 mL/hr at 12/25/17 0400  . piperacillin-tazobactam (ZOSYN)  IV 3.375 g (12/25/17 0936)  . vancomycin      Procedures/Studies: Dg Chest 2 View  Result Date: 12/24/2017 CLINICAL DATA:  Dizziness and lethargy EXAM: CHEST - 2 VIEW COMPARISON:  November 15, 2017 FINDINGS: Stable right Port-A-Cath. Elevation the right hemidiaphragm has worsened in  the interval. The cardiomediastinal silhouette is normal. No other acute abnormalities. IMPRESSION: Elevation of the right hemidiaphragm, increased in the interval. No other acute abnormalities. Electronically Signed   By: Dorise Bullion III M.D   On: 12/24/2017 22:25   Ct Head Wo Contrast  Result Date: 12/25/2017 CLINICAL DATA:  Metastatic disease of unknown primary. Weakness and dizziness since 1600 hours. EXAM: CT HEAD WITHOUT CONTRAST TECHNIQUE: Contiguous axial images were obtained from the base of the skull through the vertex without intravenous contrast. COMPARISON:  MRI brain 10/24/2017 FINDINGS: Brain: No evidence of acute infarction, hemorrhage, hydrocephalus, extra-axial collection or mass lesion/mass effect. Vascular: No hyperdense vessel or unexpected calcification. Skull:  Normal. Negative for fracture or focal lesion. Sinuses/Orbits: Retention cyst versus polyp noted within the right maxillary sinus as before. Other: None IMPRESSION: 1. No acute intracranial abnormalities. 2. No evidence for brain metastases. Electronically Signed   By: Kerby Moors M.D.   On: 12/25/2017 00:06   Ct Chest W Contrast  Result Date: 12/24/2017 CLINICAL DATA:  Metastatic cervical cancer.  Restaging. EXAM: CT CHEST, ABDOMEN, AND PELVIS WITH CONTRAST TECHNIQUE: Multidetector CT imaging of the chest, abdomen and pelvis was performed following the standard protocol during bolus administration of intravenous contrast. CONTRAST:  136mL OMNIPAQUE IOHEXOL 300 MG/ML  SOLN COMPARISON:  None. CT AP 10/20/2017 and CT chest 03/03/2017 FINDINGS: CT CHEST FINDINGS Cardiovascular: There is a right chest wall port a catheter with tip at the cavoatrial junction. Normal heart size. No pericardial effusion. Mediastinum/Nodes: No enlarged mediastinal, hilar, or axillary lymph nodes. Small cardio phrenic angle lymph nodes appear increased from previous exam. The largest measures 1 cm, image 40/2. Previously 4 mm. Thyroid gland, trachea, and esophagus demonstrate no significant findings. Lungs/Pleura: There is a new small right pleural effusion. Asymmetric elevation of the right hemidiaphragm is again noted. There is overlying passive atelectasis and volume loss from the right lower lobe. Right apical lung nodule measures 5 mm and is new from previous exam, image 21/4. Tiny nodule within the medial right lower lobe is also new measuring 3 mm, image 75/4. Musculoskeletal: No chest wall mass or suspicious bone lesions identified. CT ABDOMEN PELVIS FINDINGS Hepatobiliary: Extensive liver metastases involving both lobes of the liver are identified the confluent mass within the right lobe of liver measures 14.7 x 6.6 by 18.7 cm, image 54/2. On the previous exam this measured 11.8 x 5.2 by 11.8 cm. Posterior segment 8 lesion  measures 4.9 by 3.2 cm, image 47/2. Previously 3.6 x 2.5 cm. Multiple new lesions are identified. Lesion within segment 6 appears new measuring 2.7 x 2.4 cm, image 72/2. Lesion within the subcapsular left lobe is new measuring 3.8 x 2.8 cm, image 54/2. Mild pericholecystic fluid noted. No gallstones. No biliary dilatation. Pancreas: Unremarkable. No pancreatic ductal dilatation or surrounding inflammatory changes. Spleen: There are multiple new wedge-shaped areas of peripheral low attenuation within the spleen compatible with splenic infarcts. Adrenals/Urinary Tract: The adrenal glands appear normal. New large area of low attenuation within the inferior pole of left kidney is new measuring 4.2 by 3.1 by 4.5 cm. Wedge-shaped area of low attenuation and cortical volume loss within the posterior right kidney is new compatible with infarct, image 68/2. No hydronephrosis. Urinary bladder appears normal. Stomach/Bowel: Stomach is normal. No abnormal small bowel dilatation identified. No pathologic dilatation of the colon. Vascular/Lymphatic: Aortic atherosclerosis. Small retroperitoneal lymph nodes are identified no adenopathy within the abdomen. No pelvic or inguinal adenopathy. Reproductive: Anteverted uterus.  No discrete mass identified.  Other: Enhancing lesion within the left pelvic sidewall is again noted measuring 4.9 by 1.9 cm, image 108/2. Interval development of moderate volume ascites. New soft tissue peritoneal nodule in the right upper quadrant of the abdomen measures 1.6 cm, image 77/2. New loculated ascites overlies the right lobe of liver, image 47/2. Musculoskeletal: Mild erosive changes involving the left sacrum secondary to the left pelvic sidewall mass identified, image 124/5. IMPRESSION: 1. Interval progression of disease. 2. Significant progression of liver metastasis. 3. New abdominal ascites with loculated fluid overlying the liver and at least 1 peritoneal nodule in the right upper quadrant of the  abdomen compatible with peritoneal carcinomatosis. 4. New pulmonary nodule in the right upper lobe and right lower lobe compatible with metastatic disease. 5. Multiple splenic and renal infarcts.  New from previous exam. 6. Similar appearance of left pelvic sidewall tumor with mild erosive changes involving the left sacrum. 7. These results will be called to the ordering clinician or representative by the Radiologist Assistant, and communication documented in the PACS or zVision Dashboard. Electronically Signed   By: Kerby Moors M.D.   On: 12/24/2017 03:02   Ct Abdomen Pelvis W Contrast  Result Date: 12/24/2017 CLINICAL DATA:  Metastatic cervical cancer.  Restaging. EXAM: CT CHEST, ABDOMEN, AND PELVIS WITH CONTRAST TECHNIQUE: Multidetector CT imaging of the chest, abdomen and pelvis was performed following the standard protocol during bolus administration of intravenous contrast. CONTRAST:  158mL OMNIPAQUE IOHEXOL 300 MG/ML  SOLN COMPARISON:  None. CT AP 10/20/2017 and CT chest 03/03/2017 FINDINGS: CT CHEST FINDINGS Cardiovascular: There is a right chest wall port a catheter with tip at the cavoatrial junction. Normal heart size. No pericardial effusion. Mediastinum/Nodes: No enlarged mediastinal, hilar, or axillary lymph nodes. Small cardio phrenic angle lymph nodes appear increased from previous exam. The largest measures 1 cm, image 40/2. Previously 4 mm. Thyroid gland, trachea, and esophagus demonstrate no significant findings. Lungs/Pleura: There is a new small right pleural effusion. Asymmetric elevation of the right hemidiaphragm is again noted. There is overlying passive atelectasis and volume loss from the right lower lobe. Right apical lung nodule measures 5 mm and is new from previous exam, image 21/4. Tiny nodule within the medial right lower lobe is also new measuring 3 mm, image 75/4. Musculoskeletal: No chest wall mass or suspicious bone lesions identified. CT ABDOMEN PELVIS FINDINGS  Hepatobiliary: Extensive liver metastases involving both lobes of the liver are identified the confluent mass within the right lobe of liver measures 14.7 x 6.6 by 18.7 cm, image 54/2. On the previous exam this measured 11.8 x 5.2 by 11.8 cm. Posterior segment 8 lesion measures 4.9 by 3.2 cm, image 47/2. Previously 3.6 x 2.5 cm. Multiple new lesions are identified. Lesion within segment 6 appears new measuring 2.7 x 2.4 cm, image 72/2. Lesion within the subcapsular left lobe is new measuring 3.8 x 2.8 cm, image 54/2. Mild pericholecystic fluid noted. No gallstones. No biliary dilatation. Pancreas: Unremarkable. No pancreatic ductal dilatation or surrounding inflammatory changes. Spleen: There are multiple new wedge-shaped areas of peripheral low attenuation within the spleen compatible with splenic infarcts. Adrenals/Urinary Tract: The adrenal glands appear normal. New large area of low attenuation within the inferior pole of left kidney is new measuring 4.2 by 3.1 by 4.5 cm. Wedge-shaped area of low attenuation and cortical volume loss within the posterior right kidney is new compatible with infarct, image 68/2. No hydronephrosis. Urinary bladder appears normal. Stomach/Bowel: Stomach is normal. No abnormal small bowel dilatation identified. No  pathologic dilatation of the colon. Vascular/Lymphatic: Aortic atherosclerosis. Small retroperitoneal lymph nodes are identified no adenopathy within the abdomen. No pelvic or inguinal adenopathy. Reproductive: Anteverted uterus.  No discrete mass identified. Other: Enhancing lesion within the left pelvic sidewall is again noted measuring 4.9 by 1.9 cm, image 108/2. Interval development of moderate volume ascites. New soft tissue peritoneal nodule in the right upper quadrant of the abdomen measures 1.6 cm, image 77/2. New loculated ascites overlies the right lobe of liver, image 47/2. Musculoskeletal: Mild erosive changes involving the left sacrum secondary to the left  pelvic sidewall mass identified, image 124/5. IMPRESSION: 1. Interval progression of disease. 2. Significant progression of liver metastasis. 3. New abdominal ascites with loculated fluid overlying the liver and at least 1 peritoneal nodule in the right upper quadrant of the abdomen compatible with peritoneal carcinomatosis. 4. New pulmonary nodule in the right upper lobe and right lower lobe compatible with metastatic disease. 5. Multiple splenic and renal infarcts.  New from previous exam. 6. Similar appearance of left pelvic sidewall tumor with mild erosive changes involving the left sacrum. 7. These results will be called to the ordering clinician or representative by the Radiologist Assistant, and communication documented in the PACS or zVision Dashboard. Electronically Signed   By: Kerby Moors M.D.   On: 12/24/2017 03:02    Orson Eva, DO  [facility] Pager [phone]  If 7PM-7AM, please contact night-coverage [email or web address] Password [email or web address] 12/25/2017, 11:10 AM   LOS: 0 days

## 2017-12-25 NOTE — Progress Notes (Signed)
[facility] Hematology/Oncology Inpatient Progress Note  Date of Admission: December 24, 2017  Patient Name:  Cindy Faulkner DOB: [birth year]  Date of Service: December 25, 2017  Referring Provider: Orson Eva MD  Primary Oncology Attending: [name], MD  Consulting Physician: Henreitta Leber MD Hematology/Oncology  Primary Care Provider:  Lennie Odor, PA-C  Reason for Visit: In the setting of metastatic squamous cell carcinoma of likely cervical origin with progressive and extensive hepatic metastasis with new abdominal ascites and loculated fluid overlying the liver and a peritoneal nodule in the right upper quadrant compatible with peritoneal carcinomatosis, she was presents now for reevaluation and recommendations after being admitted with profound weakness, anorexia, and mental status changes.  Brief History: Cindy Faulkner is a 52 year old resident of [place], former ICU nurse who is past medical history is significant for dyslipidemia; non-insulin-requiring diabetes mellitus; GERD; cerebrovascular disease; atrial fibrillation on rivaroxaban; and "burned-out" hypertension; major depressive disorder without psychosis; anemia of malignancy; and severe protein calorie malnutrition.  Her primary care provider is Lennie Odor, physician assistant.  Also present at this visit is her granddaughter Maretta Bees and her daughter [name].  Dalaya was first diagnosed in January, 2019 with evidence of squamous cell carcinoma of likely cervical primary with extensive liver metastasis.  From June to September she was treated with single agent bevacizumab.  In September with evidence of progressive disease, she was started on pembrolizumab every 3 weeks.  She completed her third cycle of treatment on October 21, under the direction of Dr. [name].  I am covering while Dr. Alvy Bimler is on leave.  Over the past several weeks, she has had high fever (>101 ) cough productive of  white-clear sputum, anorexia, frequent nausea and episodic vomiting/retching. Over the past several days she has been unable to eat due to profound appetite loss and weakness. She became progressively immobile and lethargic with progressive confusion. She has chronic loose stool which has persisted over the past several months with 2-3 bowel movements daily, generally loose to semi-formed.  On her initial presentation to the emergency department on December 24, 2017: A complete blood count showed hemoglobin 8.0 hematocrit 27.2 WBC 24.1; platelet clumping identified.  A repeat complete blood count on November 3 showed hemoglobin 6.9 hematocrit 23.3 WBC 16.2; platelets 30,000.  In the setting of suspected sepsis, pancultures were obtained and she was started on vancomycin and Zosyn.  A battery of laboratory studies were obtained to exclude an underlying thyroid disorder, hemolytic process, infection-related disseminated intravascular coagulopathy, nutritional deficiency, or metabolic abnormality.   On admission, CT imaging of the abdomen pelvis with intravenous contrast was performed.  The results of that study revealed interval progression of disease since her more recent CT imaging from October 20, 2017.  There was significant progression of her liver metastasis.  Newly identified abdominal ascites with loculated fluid overlying the liver and at least one peritoneal nodule was identified in the right upper quadrant compatible with peritoneal carcinomatosis.  Multiple splenic and renal infarcts were identified.  These were new from the previous examination.  There is a similar appearance of the left pelvic sidewall tumor with mild erosive changes involving the left sacrum.  That report is detailed above.  Both phosphorus and magnesium were requested.  Vitamin B12 was >7500.  Folic acid was 4.2 (>9.9).  Since starting antibiotic therapy and intravenous fluid resuscitation, her mental status has improved.  She  denies any pain syndrome.  Her appetite is still extremely poor.  With  the exception of antiemetics and antidepressants, she is not tolerating oral intake.  The results of her laboratory studies are detailed below.  It is with this background she presents now in the setting of metastatic squamous cell carcinoma, likely progressive following 3 cycles of pembrolizumab, most recently on October 21, as outlined above.  Cancer History: [facility]: 96% probability cervical cancer  Additional evaluation revealed PTEN deletion, negative for MET amplification, PD-L1 expression of 60      Metastasis to liver ([place])   03/03/2017 Initial Diagnosis    Metastasis to liver ([place])    08/08/2017 - 11/02/2017 Chemotherapy    The patient had bevacizumab (AVASTIN) 1,800 mg in sodium chloride 0.9 % 100 mL chemo infusion, 15 mg/kg = 1,800 mg, Intravenous,  Once, 4 of 5 cycles Administration: 1,800 mg (08/09/2017), 1,800 mg (09/01/2017), 1,800 mg (09/22/2017), 1,800 mg (10/13/2017)  for chemotherapy treatment.     10/25/2017 -  Chemotherapy    The patient had pembrolizumab (KEYTRUDA) 200 mg in sodium chloride 0.9 % 50 mL chemo infusion, 200 mg, Intravenous, Once, 3 of 6 cycles Administration: 200 mg (10/31/2017), 200 mg (11/21/2017), 200 mg (12/12/2017)  for chemotherapy treatment.      Cancer of unknown origin ([place])   03/03/2017 Imaging    1. Multifocal liver metastasis. 2. Left posterior pelvic mass extending into the obturator foramen measures up to 7 cm. Suspicious for malignancy. This should be easily amendable to percutaneous tissue sampling under image guidance. 3. No lytic bone lesions identified of myeloma.    03/09/2017 Procedure    Technically successful ultrasound-guided core liver lesion biopsy    03/09/2017 Pathology Results    A. LIVER LESION, RIGHT LOBE; ULTRASOUND-GUIDED BIOPSY:  - METASTATIC SQUAMOUS CELL CARCINOMA, SEE COMMENT.   Comment: Metastatic carcinoma is  immunoreactive for p40, p16, p53 and pancytokeratin while CK7, CK20, and TTF1 are negative. Significance of p16 positivity depends on the site of origin and the pattern of immunoreactivity in this material is non-specific. Possible sites of origin include gynecologic, anal, lung, and head/neck. Correlation with physical exam and additional imaging is required.     03/18/2017 Pathology Results    She had GYN exam and pap smear. Result is negative for malignancy    03/21/2017 - 04/01/2017 Hospital Admission    She was admitted to the hospital for pain management and had radiation therapy    03/23/2017 Procedure    Ultrasound and fluoroscopically guided right internal jugular single lumen power port catheter insertion. Tip in the SVC/RA junction. Catheter ready for use.    05/16/2017 Imaging    Mild decrease in size of left posterior pelvic soft tissue mass and liver metastases. No new or progressive metastatic disease identified.  Stable hepatic steatosis and tiny nonobstructing right renal calculus.    08/08/2017 Imaging    Decreased liver metastases.  Decreased size of left internal iliac mass or lymphadenopathy, which abuts the left sacrum.  No new or progressive metastatic disease.  Stable severe hepatic steatosis.    08/08/2017 - 11/02/2017 Chemotherapy    The patient had bevacizumab (AVASTIN) 1,800 mg in sodium chloride 0.9 % 100 mL chemo infusion, 15 mg/kg = 1,800 mg, Intravenous,  Once, 4 of 5 cycles Administration: 1,800 mg (08/09/2017), 1,800 mg (09/01/2017), 1,800 mg (09/22/2017), 1,800 mg (10/13/2017)  for chemotherapy treatment.     10/21/2017 Imaging    1. Interval progression of liver metastases. 2. Poorly marginated left pelvic side wall soft tissue mass is mildly decreased in size. 3. Diffuse hepatic  steatosis. 4. Aortic Atherosclerosis (ICD10-I70.0).    10/24/2017 Imaging    1. No acute intracranial process or metastatic disease. 2. Old basal  ganglia lacunar infarcts and mild chronic small vessel ischemic changes. 3. Mild parenchymal brain volume loss.     10/25/2017 -  Chemotherapy    The patient had pembrolizumab (KEYTRUDA) 200 mg in sodium chloride 0.9 % 50 mL chemo infusion, 200 mg, Intravenous, Once, 3 of 6 cycles Administration: 200 mg (10/31/2017), 200 mg (11/21/2017), 200 mg (12/12/2017)  for chemotherapy treatment.      Cervical cancer ([place])   07/20/2017 Initial Diagnosis    Cervical cancer ([place])    08/08/2017 - 11/02/2017 Chemotherapy    The patient had bevacizumab (AVASTIN) 1,800 mg in sodium chloride 0.9 % 100 mL chemo infusion, 15 mg/kg = 1,800 mg, Intravenous,  Once, 4 of 5 cycles Administration: 1,800 mg (08/09/2017), 1,800 mg (09/01/2017), 1,800 mg (09/22/2017), 1,800 mg (10/13/2017)  for chemotherapy treatment.     10/25/2017 -  Chemotherapy    The patient had pembrolizumab (KEYTRUDA) 200 mg in sodium chloride 0.9 % 50 mL chemo infusion, 200 mg, Intravenous, Once, 3 of 6 cycles Administration: 200 mg (10/31/2017), 200 mg (11/21/2017), 200 mg (12/12/2017)  for chemotherapy treatment.                                 12/12/2017                    The patient had pembrolizumab                                                                              (KEYTRUDA) 200 mg in sodium                                                                              chloride 0.9 % 50 mL chemo                                                                                   infusion, 200 mg, Intravenous, Once,                                                                      3 of 6 cycles  Administration: 200 mg (12/12/2017)                                                                      for chemotherapy treatment.    Past Medical History:  Diagnosis Date  . Atrial fibrillation ([place])    a. echo 4/07: EF 60%  . Atrial tachycardia ([place])    a.  s/p EPS 4/09: no inducible SVT - med Tx continued  . Cancer ([place])   . Complication of anesthesia    age 1yo, woke during procedure with GA, paralysed   . Endometrial polyp   . Hyperlipidemia   . Hypertension   . PONV (postoperative nausea and vomiting)   . Rectocele    WITHOUT MENTION OF UTERINE PROLAPSE  . Stroke ([place])   . SVD (spontaneous vaginal delivery)    x 1   Past Medical History Reviewed        Family History Reviewed       Social History Reviewed  Allergies:  No known medical allergies  Current Medications: Vitamin D 2000 units once daily Citalopram 20 mg once daily Dronabinol 2.5 mg twice daily Flecainide 50 mg once daily Folic acid 1 mg twice daily Magnesium oxide 400 mg twice daily Ondansetron 4 mg every 6 hours Protonix 40 mg once daily Zosyn 3.375 g every 8 hours Phenergan 12.5 mg every 6 hours by injection as needed Protein supplement twice daily Rivaroxaban 20 mg once daily Vancomycin 750 mg every 12 hours Zolpidem: 5 mg at bedtime as needed  Review of Systems: Constitutional: Preadmission fever without sweats, or shaking chills.  Profound appetite and weight deficit. Skin: No rash, scaling, sores, lumps, or jaundice. HEENT: No visual changes or hearing deficit. Pulmonary: Productive cough with white phlegm, now improved; no sore throat, or orthopnea. Breasts: No complaints. Cardiovascular: No coronary artery disease, angina, or myocardial infarction.  Atrial fibrillation; essential hypertension and dyslipidemia. Gastrointestinal: No indigestion, dysphagia, abdominal pain or constipation.  Frequent loose stools 2-3 times daily loose/semi-formed.  Frequent nausea and episodic vomiting.  No melena or bright red blood per rectum.  Abdominal ascites; peritoneal nodular implants (peritoneal carcinomatosis). Genitourinary: No urinary frequency, urgency, hematuria, or dysuria. Musculoskeletal: No new arthralgias or myalgias; no joint swelling, pain,  or instability; sacral metastasis. Hematologic: No bleeding tendency; easy bruisability on rivaroxaban; anemia and thrombocytopenia. Endocrine: No intolerance to hot or cold; no thyroid disease; non-insulin requiring diabetes mellitus. Vascular: No peripheral arterial or venous thromboembolic disease. Psychological: Major depression and mild anxiety disorder; transient confusion now improved. Neurological: No dizziness, lightheadedness, syncope, or near syncopal episodes; no numbness or tingling in the fingers or toes.  Physical Examination: Vital Signs: BP 142/89    HR 91    RR 16    T 98.1    O2 Sat.  99% Constitutional: Cindy Faulkner is fully nourished and developed.  She looks chronically ill.  She is friendly and cooperative without respiratory compromise at rest. Skin: No rashes, scaling, dryness, jaundice, or itching. HEENT: Head is normocephalic and atraumatic.  Pupils are equal round and reactive to light and accommodation.  Sclerae are anicteric. Conjunctivae are pale. No sinus tenderness nor oropharyngeal lesions.  Lips without cracking or peeling; tongue without mass, inflammation, or nodularity.  Mucous membranes  are moist. Neck: Supple and symmetric.  No jugular venous distention or thyromegaly.  Trachea is midline. Lymphatics: No cervical or supraclavicular lymphadenopathy.  No epitrochlear, axillary, or inguinal lymphadenopathy is appreciated. Respiratory/chest: Thorax is symmetrical.  Breath sounds are clear to auscultation and percussion.  Normal excursion and respiratory effort. Cardiovascular: Heart rate and rhythm are regular. Gastrointestinal: Abdomen is soft, tender to deep palpation in the mid-abdomen; no organomegaly. Shifting dullness.  Bowel sounds are normoactive.  No masses are appreciated. Genitourinary: Normal external female/female genitalia. Extremities: In the lower extremities, there is no asymmetric swelling, erythema, tenderness, or cord formation.  No clubbing,  cyanosis, nor edema. Hematologic: No petechiae, hematomas, or ecchymoses. Psychological: She is oriented to person, place, and time. Neurological: Examination was limited.  Gait was not assessed.  She moves all 4 extremities.  Laboratory Results: December 25, 2017:  Ref Range & Units 06:19  WBC 4.0 - 10.5 K/uL 16.2High    RBC 3.87 - 5.11 MIL/uL 2.59Low    Hemoglobin 12.0 - 15.0 g/dL 6.9Low Panic    Comment: REPEATED TO VERIFY  CRITICAL RESULT CALLED TO, READ BACK BY AND VERIFIED WITH:  Z JAMA,RN 12/25/17 0907 RHOLMES   HCT 36.0 - 46.0 % 23.3Low    MCV 80.0 - 100.0 fL 90.0   MCH 26.0 - 34.0 pg 26.6   MCHC 30.0 - 36.0 g/dL 29.6Low    RDW 11.5 - 15.5 % 20.5High    Platelets 150 - 400 K/uL 30Low         Neutrophils Relative % % 87   Lymphocytes Relative % 8   Monocytes Relative % 5   Eosinophils Relative % 0   Basophils Relative % 0   Band Neutrophils % 0   Metamyelocytes Relative % 0   Myelocytes % 0   Promyelocytes Relative % 0   Blasts % 0   nRBC 0 /100 WBC 0   Other % 0   Neutro Abs 1.7 - 7.7 K/uL 14.1High    Lymphs Abs 0.7 - 4.0 K/uL 1.3   Monocytes Absolute 0.1 - 1.0 K/uL 0.8   Eosinophils Absolute 0.0 - 0.5 K/uL 0.0   Basophils Absolute 0.0 - 0.1 K/uL 0.0     Ref Range & Units 06:19  Sodium 135 - 145 mmol/L 130Low    Potassium 3.5 - 5.1 mmol/L 3.3Low    Chloride 98 - 111 mmol/L 98   CO2 22 - 32 mmol/L 24   Glucose, Bld 70 - 99 mg/dL 145High    BUN 6 - 20 mg/dL 9   Creatinine, Ser 0.44 - 1.00 mg/dL 0.50   Calcium 8.9 - 10.3 mg/dL 7.7Low    Total Protein 6.5 - 8.1 g/dL 5.0Low    Albumin 3.5 - 5.0 g/dL 1.7Low    AST 15 - 41 U/L 39   ALT 0 - 44 U/L 20   Alkaline Phosphatase 38 - 126 U/L 138High    Total Bilirubin 0.3 - 1.2 mg/dL 1.4High    GFR calc non Af Amer >60 mL/min >60   GFR calc Af Amer >60 mL/min >60     Ref Range & Units 06:30  Color, Urine YELLOW BROWNAbnormal    Comment: BIOCHEMICALS MAY BE AFFECTED BY COLOR  APPearance CLEAR TURBIDAbnormal     Specific Gravity, Urine 1.005 - 1.030 1.020   pH 5.0 - 8.0 6.0   Glucose, UA NEGATIVE mg/dL 100Abnormal    Hgb urine dipstick NEGATIVE NEGATIVE   Bilirubin Urine NEGATIVE SMALLAbnormal    Ketones, ur  NEGATIVE mg/dL NEGATIVE   Protein, ur NEGATIVE mg/dL NEGATIVE   Nitrite NEGATIVE NEGATIVE   Leukocytes, UA NEGATIVE NEGATIVE    RBC / HPF 0 - 5 RBC/hpf NONE SEEN   WBC, UA 0 - 5 WBC/hpf NONE SEEN   Bacteria, UA NONE SEEN MANYAbnormal    Squamous Epithelial / LPF 0 - 5 NONE SEEN   Amorphous Crystal  PRESENT   DAT: Negative  Ref Range & Units 12:15  Prothrombin Time 11.4 - 15.2 seconds 31.5High    INR  3.10   aPTT 24 - 36 seconds 45High    Comment:     IF BASELINE aPTT IS ELEVATED,  SUGGEST PATIENT RISK ASSESSMENT  BE USED TO DETERMINE APPROPRIATE  ANTICOAGULANT THERAPY.   Fibrinogen 210 - 475 mg/dL 221   D-Dimer, Quant 0.00 - 0.50 ug/mL-FEU 16.01High    Comment: (NOTE)  At the manufacturer cut-off of 0.50 ug/mL FEU, this assay has been  documented to exclude PE with a sensitivity and negative predictive  value of 97 to 99%. At this time, this assay has not been approved  by the FDA to exclude DVT/VTE.  Results should be correlated with clinical presentation.   Platelets 150 - 400 K/uL 40Low     Ref Range & Units 12:15   Retic Ct Pct 0.4 - 3.1 % 3.6High    RBC. 3.87 - 5.11 MIL/uL 2.85Low    Retic Count, Absolute 19.0 - 186.0 K/uL 101.2   Immature Retic Fract 2.3 - 15.9 % 38.3High    Reticulocyte Hemoglobin >27.9 pg 29.7   TSH 2.932 Ammonia 19 (9-35) Vitamin Z00 >9233 Folic acid 4.2 (>0.0) C. difficile antigen/toxin: Negative  Diagnostic/Imaging Studies: December 24, 2017 CT CHEST, ABDOMEN, AND PELVIS WITH CONTRAST  TECHNIQUE: Multidetector CT imaging of the chest, abdomen and pelvis was performed following the standard protocol during bolus administration of intravenous contrast.  CONTRAST:  163m OMNIPAQUE IOHEXOL 300 MG/ML  SOLN  COMPARISON:   None.  CT AP 10/20/2017 and CT chest 03/03/2017  FINDINGS: CT CHEST FINDINGS  Cardiovascular: There is a right chest wall port a catheter with tip at the cavoatrial junction. Normal heart size. No pericardial effusion.  Mediastinum/Nodes: No enlarged mediastinal, hilar, or axillary lymph nodes. Small cardio phrenic angle lymph nodes appear increased from previous exam. The largest measures 1 cm, image 40/2. Previously 4 mm. Thyroid gland, trachea, and esophagus demonstrate no significant findings.  Lungs/Pleura: There is a new small right pleural effusion. Asymmetric elevation of the right hemidiaphragm is again noted. There is overlying passive atelectasis and volume loss from the right lower lobe. Right apical lung nodule measures 5 mm and is new from previous exam, image 21/4. Tiny nodule within the medial right lower lobe is also new measuring 3 mm, image 75/4.  Musculoskeletal: No chest wall mass or suspicious bone lesions identified.  CT ABDOMEN PELVIS FINDINGS  Hepatobiliary: Extensive liver metastases involving both lobes of the liver are identified the confluent mass within the right lobe of liver measures 14.7 x 6.6 by 18.7 cm, image 54/2. On the previous exam this measured 11.8 x 5.2 by 11.8 cm. Posterior segment 8 lesion measures 4.9 by 3.2 cm, image 47/2. Previously 3.6 x 2.5 cm. Multiple new lesions are identified. Lesion within segment 6 appears new measuring 2.7 x 2.4 cm, image 72/2. Lesion within the subcapsular left lobe is new measuring 3.8 x 2.8 cm, image 54/2. Mild pericholecystic fluid noted. No gallstones. No biliary dilatation.  Pancreas: Unremarkable. No pancreatic ductal dilatation  or surrounding inflammatory changes.  Spleen: There are multiple new wedge-shaped areas of peripheral low attenuation within the spleen compatible with splenic infarcts.  Adrenals/Urinary Tract: The adrenal glands appear normal. New large area of low  attenuation within the inferior pole of left kidney is new measuring 4.2 by 3.1 by 4.5 cm. Wedge-shaped area of low attenuation and cortical volume loss within the posterior right kidney is new compatible with infarct, image 68/2. No hydronephrosis. Urinary bladder appears normal.  Stomach/Bowel: Stomach is normal. No abnormal small bowel dilatation identified. No pathologic dilatation of the colon.  Vascular/Lymphatic: Aortic atherosclerosis. Small retroperitoneal lymph nodes are identified no adenopathy within the abdomen. No pelvic or inguinal adenopathy.  Reproductive: Anteverted uterus.  No discrete mass identified.  Other: Enhancing lesion within the left pelvic sidewall is again noted measuring 4.9 by 1.9 cm, image 108/2. Interval development of moderate volume ascites. New soft tissue peritoneal nodule in the right upper quadrant of the abdomen measures 1.6 cm, image 77/2. New loculated ascites overlies the right lobe of liver, image 47/2.  Musculoskeletal: Mild erosive changes involving the left sacrum secondary to the left pelvic sidewall mass identified, image 124/5.  IMPRESSION: 1. Interval progression of disease. 2. Significant progression of liver metastasis. 3. New abdominal ascites with loculated fluid overlying the liver and at least 1 peritoneal nodule in the right upper quadrant of the abdomen compatible with peritoneal carcinomatosis. 4. New pulmonary nodule in the right upper lobe and right lower lobe compatible with metastatic disease. 5. Multiple splenic and renal infarcts.  New from previous exam. 6. Similar appearance of left pelvic sidewall tumor with mild erosive changes involving the left sacrum. 7. These results will be called to the ordering clinician or representative by the Radiologist Assistant, and communication documented in the PACS or zVision Dashboard.  Kerby Moors M.D. 12/24/2017 03:02  Summary/Assessment: In the setting of  metastatic squamous cell carcinoma of likely cervical origin with progressive and extensive hepatic metastasis with new abdominal ascites and loculated fluid overlying the liver and a peritoneal nodule in the right upper quadrant compatible with peritoneal carcinomatosis, she was presents now for reevaluation and recommendations after being admitted with profound weakness, anorexia, and mental status changes.  Cindy Faulkner was first diagnosed in January, 2019 with evidence of squamous cell carcinoma of likely cervical primary with extensive liver metastasis.  From June to September she was treated with single agent bevacizumab.  In September with evidence of progressive disease, she was started on pembrolizumab every 3 weeks.  She completed her third cycle of treatment on October 21, under the direction of Dr. [name].  I am covering while Dr. Alvy Bimler is on leave.  Over the past several weeks, she has had high fever (>101 ) cough productive of white-clear sputum, anorexia, frequent nausea and episodic vomiting/retching. Over the past several days she has been unable to eat due to profound appetite loss and weakness. She became progressively immobile and lethargic with progressive confusion. She has chronic loose stool which has persisted over the past several months with 2-3 bowel movements daily, generally loose to semi-formed.  On her initial presentation to the emergency department on December 24, 2017: A complete blood count showed hemoglobin 8.0 hematocrit 27.2 WBC 24.1; platelet clumping identified.  A repeat complete blood count on November 3 showed hemoglobin 6.9 hematocrit 23.3 WBC 16.2; platelets 30,000.  In the setting of suspected sepsis, pancultures were obtained and she was started on vancomycin and Zosyn.  A battery of laboratory studies  were obtained to exclude an underlying thyroid disorder, hemolytic process, infection-related disseminated intravascular coagulopathy, nutritional deficiency, or  metabolic abnormality.   On admission, CT imaging of the abdomen pelvis with intravenous contrast was performed.  The results of that study revealed interval progression of disease since her more recent CT imaging from October 20, 2017.  There was significant progression of her liver metastasis.  Newly identified abdominal ascites with loculated fluid overlying the liver and at least one peritoneal nodule was identified in the right upper quadrant compatible with peritoneal carcinomatosis.  Multiple splenic and renal infarcts were identified.  These were new from the previous examination.  There is a similar appearance of the left pelvic sidewall tumor with mild erosive changes involving the left sacrum.  That report is detailed above.  Both phosphorus and magnesium were requested.  Vitamin B12 was >7500.  Folic acid was 4.2 (>1.6).  Since starting antibiotic therapy and intravenous fluid resuscitation, her mental status has improved.  She denies any pain syndrome. Her appetite is still extremely poor.  With the exception of antiemetics and antidepressants, she is not tolerating oral intake. The results of her laboratory studies are detailed above.   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   Recommendation/Plan: I discussed in detail the results of her preliminary laboratory studies and CT imaging with both Mariyam and her family.  The results of her laboratory studies are strongly suggestive of sepsis syndrome.  Her DIC panel is suggestive.  Her mental status appears to be improved with regard to less confusion.  She was transfused 1 unit of packed RBCs today.  A daily complete blood count will be obtained with aggressive transfusion support as  necessary.  Her severe protein calorie malnutrition is closely aligned with her weakness, easy fatigability, immobility, and lethargy.  Unfortunately, the obvious progressive disease within the abdomen contributes significantly to her anorexia, mental status, and debilitation.  Every effort is being made to correct her electrolyte imbalance and dehydration. Because her folic acid level is low, she was started on folic acid: 1 mg twice daily.  Iron studies have been requested.  A quick prep for C. difficile was negative. Molecular studies and stool cultures are pending.  There is no evidence of hemolysis.  At the present, supportive care with both aggressive hematologic and antimicrobial support is very reasonable.  Unfortunately her prognosis is grave given the obvious progressive disease.  Even in the salvage setting, at present, she is not a good candidate for systemic cytotoxic chemotherapy.  I will continue to follow her progress while hospitalized and recommend further as the clinical situation dictates.  For the present, I am in agreement with the current antibiotic regimen.  They will likely be adjustments in her antibiotic treatment on the basis of her existing pancultures.  The total time spent discussing her complex history with family, laboratory studies, imaging, and initial plan and recommendation was 45 minutes. At least 50% of that time was spent in face-to-face discussion, reviewing her records, laboratory studies, counseling, and answering questions.  This note was dictated using voice activated technology/software.  Unfortunately, typographical errors are not uncommon, and transcription is subject to mistakes and regrettably misinterpretation.  If necessary, clarification of the above information can be discussed with me at any time.   cc:          Ni  [place], PA-C   Henreitta Leber, MD Hematology/Oncology [place] [address] [place], [place] [address] Office: [phone] [facility]: [phone]

## 2017-12-25 NOTE — Progress Notes (Signed)
Pharmacy Antibiotic Note  MARCELLE HEPNER is a 52 y.o. female admitted on 12/24/2017 with sepsis.  Pharmacy has been consulted for zosyn and vancomycin dosing.  Plan: Zosyn 3.375g IV q8h (4 hour infusion).  Vancomycin 2 gm x1 then 750 mg IV q8h for est AUC = 481 Goal AUC = 400-500 F/u cultures/levels Daily scr   Height: 5\' 7"  (170.2 cm) Weight: 220 lb (99.8 kg) IBW/kg (Calculated) : 61.6  Temp (24hrs), Avg:98.3 F (36.8 C), Min:98.3 F (36.8 C), Max:98.3 F (36.8 C)  Recent Labs  Lab 12/24/17 2107 12/24/17 2141  WBC 24.1*  --   CREATININE  --  0.58    Estimated Creatinine Clearance: 97.6 mL/min (by C-G formula based on SCr of 0.58 mg/dL).    No Known Allergies  Antimicrobials this admission: 11/3 zosyn >>  11/3 vancomycin >>   Dose adjustments this admission:   Microbiology results:  BCx:   UCx:    Sputum:    MRSA PCR:   Thank you for allowing pharmacy to be a part of this patient's care.  Dorrene German 12/25/2017 2:58 AM

## 2017-12-25 NOTE — H&P (Signed)
History and Physical    [name] VEH:[id number] DOB: February 07, 1966 DOA: 12/24/2017  Referring MD/NP/PA:   PCP: Lennie Odor, PA-C   Patient coming from:  The patient is coming from home.  At baseline, pt is independent for most of ADL.        Chief Complaint: Altered mental status  HPI: Cindy Faulkner is a 52 y.o. female with medical history significant of metastatic squamous cell carcinoma involving liver & sacrum with unknown primary site (stoped chemo in May), hypertension, hyperlipidemia, stroke, GERD, depression, atrial fibrillation on Xarelto, who presents with altered mental status.  Per patient's husband, in the past several days, patient has been very lethargic, mildly confused.  She does not have unilateral weakness in extremities with no facial droop or slurred speech.  Patient has intermittent fever with temperature around 100.  Patient does not have chest pain or shortness of breath, but has a mild cough with a white-colored mucus production.  Patient has mild chronic diarrhea, which has not changed.  He had a one loose stool bowel movement today.  Patient nausea and vomited several times recently, but currently no nausea vomiting.  Denies abdominal pain pain no symptoms of UTI.  When I saw patient in ED, patient is mildly confused, oriented to place and person, but not to time.  She moves all extremities normally.  No neck rigidity.  ED Course: pt was found to have WBC 24.1, hemoglobin dropped from 9.2 on 12/16/2017 to 8.0, thrombocytopenia with platelet 27 which was 103 on 12/16/2017, INR 3.66, PTT 55, negative pregnancy test, lipase 18, sodium 128, creatinine and BUN normal, abnormal liver function with ALP 168, AST 47, ALT 24, total bilirubin 1.4), d-dimer 14.95, rectal temperature 100, tachycardia, tachypnea, oxygen saturation 93% on room air.  Chest x-ray is negative for infiltration, but with right hemi-diaphragmatic elevation, CT head is negative for acute intracranial  abnormalities.  Patient is admitted to telemetry bed as inpatient.  Review of Systems: Could not be reviewed accurately due to altered mental status.  Allergy: No Known Allergies  Past Medical History:  Diagnosis Date  . Atrial fibrillation ([place])    a. echo 4/07: EF 60%  . Atrial tachycardia ([place])    a. s/p EPS 4/09: no inducible SVT - med Tx continued  . Cancer ([place])   . Complication of anesthesia    age 55yo, woke during procedure with GA, paralysed   . Endometrial polyp   . Hyperlipidemia   . Hypertension   . PONV (postoperative nausea and vomiting)   . Rectocele    WITHOUT MENTION OF UTERINE PROLAPSE  . Stroke ([place])   . SVD (spontaneous vaginal delivery)    x 1    Past Surgical History:  Procedure Laterality Date  . COLONOSCOPY    . HYSTEROSCOPY    . IR FLUORO GUIDE PORT INSERTION RIGHT  03/23/2017  . IR US GUIDE VASC ACCESS RIGHT  03/23/2017  . LEEP    . Pylonidal cystect    . TUBAL LIGATION    . WISDOM TOOTH EXTRACTION      Social History:  reports that she quit smoking about 19 years ago. Her smoking use included cigarettes. She has a 5.00 pack-year smoking history. She has never used smokeless tobacco. She reports that she does not drink alcohol or use drugs.  Family History:  Family History  Problem Relation Age of Onset  . Hypertension Father   . Diabetes Father   . Stroke Father   .  Hypertension Mother   . Stroke Mother   . Cancer Mother        cervical ca  . Cancer Sister 21       uterine ca  . Cancer Maternal Grandmother        lung ca     Prior to Admission medications   Medication Sig Start Date End Date Taking? Authorizing Provider  cholecalciferol (VITAMIN D) 1000 units tablet Take 2,000 Units by mouth daily.   Yes [provider]  citalopram (CELEXA) 40 MG tablet Take 20 mg by mouth daily. 09/20/17  Yes [provider]  dronabinol (MARINOL) 2.5 MG capsule Take 1 capsule (2.5 mg total) by mouth 2 (two) times daily before a  meal. Patient taking differently: Take 2.5 mg by mouth 2 (two) times daily as needed. Nausea 12/12/17  Yes Gorsuch, Ni, MD  flecainide (TAMBOCOR) 50 MG tablet Take 1 tablet (50 mg total) by mouth daily. Please make overdue appt with Dr. Lovena Le before anymore refills. 1st attempt Patient taking differently: Take 50 mg by mouth daily.  11/29/17  Yes Evans Lance, MD  lidocaine-prilocaine (EMLA) cream APPLY 1 APPLICATION TO AFFECTED AREA(S) TOPICALLY AS NEEDED Patient taking differently: Apply 1 application topically daily as needed (port).  11/14/17  Yes Gorsuch, Ni, MD  magnesium oxide (MAG-OX) 400 (241.3 Mg) MG tablet Take 1 tablet (400 mg total) by mouth 2 (two) times daily. 11/18/17  Yes Gorsuch, Ni, MD  metFORMIN (GLUCOPHAGE) 500 MG tablet Take 1 tablet (500 mg total) by mouth 2 (two) times daily. Patient taking differently: Take 500 mg by mouth 2 (two) times daily as needed (low sugar).  10/31/17  Yes Gorsuch, Ni, MD  ondansetron (ZOFRAN) 8 MG tablet Take 1 tablet (8 mg total) by mouth every 8 (eight) hours as needed for nausea. Patient taking differently: Take 8 mg by mouth every 8 (eight) hours.  04/26/17  Yes Gorsuch, Ni, MD  pantoprazole (PROTONIX) 40 MG tablet Take 1 tablet (40 mg total) by mouth daily. 12/19/17  Yes Levin Erp, PA  protein supplement shake (PREMIER PROTEIN) LIQD Take 325 mLs (11 oz total) by mouth 2 (two) times daily between meals. 11/18/17  Yes Gorsuch, Ernst Spell, MD  rivaroxaban (XARELTO) 20 MG TABS tablet Take 1 tablet (20 mg total) by mouth daily. 12/06/15  Yes Dhungel, Nishant, MD  polyethylene glycol (MIRALAX / GLYCOLAX) packet Take 17 g by mouth daily. Patient not taking: Reported on 12/25/2017 04/01/17   Shelly Coss, MD    Physical Exam: Vitals:   12/25/17 0100 12/25/17 0130 12/25/17 0230 12/25/17 0347  BP: 139/83 (!) 142/84 (!) 140/91 139/77  Pulse: 97 96 96 93  Resp: 14 16 (!) 0 (!) 22  Temp:    98.2 F (36.8 C)  TempSrc:    Oral  SpO2: 94% 93% 93% 94%   Weight:      Height:       General: Not in acute distress HEENT:       Eyes: PERRL, EOMI, no scleral icterus.       ENT: No discharge from the ears and nose, no pharynx injection, no tonsillar enlargement.        Neck: No JVD, no bruit, no mass felt. Heme: No neck lymph node enlargement. Cardiac: S1/S2, RRR, No murmurs, No gallops or rubs. Respiratory: No rales, wheezing, rhonchi or rubs. GI: Soft, nondistended, nontender, no rebound pain, no organomegaly, BS present. GU: No hematuria Ext: No pitting leg edema bilaterally. 2+DP/PT pulse bilaterally. Musculoskeletal:  No joint deformities, No joint redness or warmth, no limitation of ROM in spin. Skin: No rashes.  Neuro: mildly confused, oriented to place and person, but not to time, cranial nerves II-XII grossly intact, moves all extremities normally. Psych: Patient is not psychotic, no suicidal or hemocidal ideation.  Labs on Admission: I have personally reviewed following labs and imaging studies  CBC: Recent Labs  Lab 12/24/17 2107 12/25/17 0047  WBC 24.1*  --   HGB 8.0*  --   HCT 27.2*  --   MCV 89.8  --   PLT PLATELET CLUMPS NOTED ON SMEAR, COUNT APPEARS DECREASED 27*   Basic Metabolic Panel: Recent Labs  Lab 12/24/17 2141 12/25/17 0245  NA 128*  --   K 3.3*  --   CL 93*  --   CO2 24  --   GLUCOSE 128*  --   BUN 9  --   CREATININE 0.58  --   CALCIUM 8.2*  --   MG  --  1.3*   GFR: Estimated Creatinine Clearance: 97.6 mL/min (by C-G formula based on SCr of 0.58 mg/dL). Liver Function Tests: Recent Labs  Lab 12/24/17 2141  AST 47*  ALT 24  ALKPHOS 168*  BILITOT 1.4*  PROT 5.8*  ALBUMIN 2.0*   Recent Labs  Lab 12/24/17 2141  LIPASE 18   No results for input(s): AMMONIA in the last 168 hours. Coagulation Profile: Recent Labs  Lab 12/25/17 0047  INR 3.66   Cardiac Enzymes: No results for input(s): CKTOTAL, CKMB, CKMBINDEX, TROPONINI in the last 168 hours. BNP (last 3 results) No results for  input(s): PROBNP in the last 8760 hours. HbA1C: No results for input(s): HGBA1C in the last 72 hours. CBG: Recent Labs  Lab 12/24/17 2245  GLUCAP 126*   Lipid Profile: No results for input(s): CHOL, HDL, LDLCALC, TRIG, CHOLHDL, LDLDIRECT in the last 72 hours. Thyroid Function Tests: No results for input(s): TSH, T4TOTAL, FREET4, T3FREE, THYROIDAB in the last 72 hours. Anemia Panel: No results for input(s): VITAMINB12, FOLATE, FERRITIN, TIBC, IRON, RETICCTPCT in the last 72 hours. Urine analysis:    Component Value Date/Time   COLORURINE YELLOW 11/15/2017 1723   APPEARANCEUR CLEAR 11/15/2017 1723   LABSPEC 1.005 11/15/2017 1723   PHURINE 6.0 11/15/2017 1723   GLUCOSEU NEGATIVE 11/15/2017 1723   HGBUR NEGATIVE 11/15/2017 1723   BILIRUBINUR NEGATIVE 11/15/2017 1723   KETONESUR 20 (A) 11/15/2017 1723   PROTEINUR NEGATIVE 11/15/2017 1723   UROBILINOGEN 0.2 11/27/2010 1122   NITRITE NEGATIVE 11/15/2017 1723   LEUKOCYTESUR NEGATIVE 11/15/2017 1723   Sepsis Labs: @LABRCNTIP (procalcitonin:4,lacticidven:4) )No results found for this or any previous visit (from the past 240 hour(s)).   Radiological Exams on Admission: Dg Chest 2 View  Result Date: 12/24/2017 CLINICAL DATA:  Dizziness and lethargy EXAM: CHEST - 2 VIEW COMPARISON:  November 15, 2017 FINDINGS: Stable right Port-A-Cath. Elevation the right hemidiaphragm has worsened in the interval. The cardiomediastinal silhouette is normal. No other acute abnormalities. IMPRESSION: Elevation of the right hemidiaphragm, increased in the interval. No other acute abnormalities. Electronically Signed   By: Dorise Bullion III M.D   On: 12/24/2017 22:25   Ct Head Wo Contrast  Result Date: 12/25/2017 CLINICAL DATA:  Metastatic disease of unknown primary. Weakness and dizziness since 1600 hours. EXAM: CT HEAD WITHOUT CONTRAST TECHNIQUE: Contiguous axial images were obtained from the base of the skull through the vertex without intravenous  contrast. COMPARISON:  MRI brain 10/24/2017 FINDINGS: Brain: No evidence of acute infarction, hemorrhage,  hydrocephalus, extra-axial collection or mass lesion/mass effect. Vascular: No hyperdense vessel or unexpected calcification. Skull: Normal. Negative for fracture or focal lesion. Sinuses/Orbits: Retention cyst versus polyp noted within the right maxillary sinus as before. Other: None IMPRESSION: 1. No acute intracranial abnormalities. 2. No evidence for brain metastases. Electronically Signed   By: Kerby Moors M.D.   On: 12/25/2017 00:06   Ct Chest W Contrast  Result Date: 12/24/2017 CLINICAL DATA:  Metastatic cervical cancer.  Restaging. EXAM: CT CHEST, ABDOMEN, AND PELVIS WITH CONTRAST TECHNIQUE: Multidetector CT imaging of the chest, abdomen and pelvis was performed following the standard protocol during bolus administration of intravenous contrast. CONTRAST:  160mL OMNIPAQUE IOHEXOL 300 MG/ML  SOLN COMPARISON:  None. CT AP 10/20/2017 and CT chest 03/03/2017 FINDINGS: CT CHEST FINDINGS Cardiovascular: There is a right chest wall port a catheter with tip at the cavoatrial junction. Normal heart size. No pericardial effusion. Mediastinum/Nodes: No enlarged mediastinal, hilar, or axillary lymph nodes. Small cardio phrenic angle lymph nodes appear increased from previous exam. The largest measures 1 cm, image 40/2. Previously 4 mm. Thyroid gland, trachea, and esophagus demonstrate no significant findings. Lungs/Pleura: There is a new small right pleural effusion. Asymmetric elevation of the right hemidiaphragm is again noted. There is overlying passive atelectasis and volume loss from the right lower lobe. Right apical lung nodule measures 5 mm and is new from previous exam, image 21/4. Tiny nodule within the medial right lower lobe is also new measuring 3 mm, image 75/4. Musculoskeletal: No chest wall mass or suspicious bone lesions identified. CT ABDOMEN PELVIS FINDINGS Hepatobiliary: Extensive liver  metastases involving both lobes of the liver are identified the confluent mass within the right lobe of liver measures 14.7 x 6.6 by 18.7 cm, image 54/2. On the previous exam this measured 11.8 x 5.2 by 11.8 cm. Posterior segment 8 lesion measures 4.9 by 3.2 cm, image 47/2. Previously 3.6 x 2.5 cm. Multiple new lesions are identified. Lesion within segment 6 appears new measuring 2.7 x 2.4 cm, image 72/2. Lesion within the subcapsular left lobe is new measuring 3.8 x 2.8 cm, image 54/2. Mild pericholecystic fluid noted. No gallstones. No biliary dilatation. Pancreas: Unremarkable. No pancreatic ductal dilatation or surrounding inflammatory changes. Spleen: There are multiple new wedge-shaped areas of peripheral low attenuation within the spleen compatible with splenic infarcts. Adrenals/Urinary Tract: The adrenal glands appear normal. New large area of low attenuation within the inferior pole of left kidney is new measuring 4.2 by 3.1 by 4.5 cm. Wedge-shaped area of low attenuation and cortical volume loss within the posterior right kidney is new compatible with infarct, image 68/2. No hydronephrosis. Urinary bladder appears normal. Stomach/Bowel: Stomach is normal. No abnormal small bowel dilatation identified. No pathologic dilatation of the colon. Vascular/Lymphatic: Aortic atherosclerosis. Small retroperitoneal lymph nodes are identified no adenopathy within the abdomen. No pelvic or inguinal adenopathy. Reproductive: Anteverted uterus.  No discrete mass identified. Other: Enhancing lesion within the left pelvic sidewall is again noted measuring 4.9 by 1.9 cm, image 108/2. Interval development of moderate volume ascites. New soft tissue peritoneal nodule in the right upper quadrant of the abdomen measures 1.6 cm, image 77/2. New loculated ascites overlies the right lobe of liver, image 47/2. Musculoskeletal: Mild erosive changes involving the left sacrum secondary to the left pelvic sidewall mass identified,  image 124/5. IMPRESSION: 1. Interval progression of disease. 2. Significant progression of liver metastasis. 3. New abdominal ascites with loculated fluid overlying the liver and at least 1 peritoneal nodule in  the right upper quadrant of the abdomen compatible with peritoneal carcinomatosis. 4. New pulmonary nodule in the right upper lobe and right lower lobe compatible with metastatic disease. 5. Multiple splenic and renal infarcts.  New from previous exam. 6. Similar appearance of left pelvic sidewall tumor with mild erosive changes involving the left sacrum. 7. These results will be called to the ordering clinician or representative by the Radiologist Assistant, and communication documented in the PACS or zVision Dashboard. Electronically Signed   By: Kerby Moors M.D.   On: 12/24/2017 03:02   Ct Abdomen Pelvis W Contrast  Result Date: 12/24/2017 CLINICAL DATA:  Metastatic cervical cancer.  Restaging. EXAM: CT CHEST, ABDOMEN, AND PELVIS WITH CONTRAST TECHNIQUE: Multidetector CT imaging of the chest, abdomen and pelvis was performed following the standard protocol during bolus administration of intravenous contrast. CONTRAST:  126mL OMNIPAQUE IOHEXOL 300 MG/ML  SOLN COMPARISON:  None. CT AP 10/20/2017 and CT chest 03/03/2017 FINDINGS: CT CHEST FINDINGS Cardiovascular: There is a right chest wall port a catheter with tip at the cavoatrial junction. Normal heart size. No pericardial effusion. Mediastinum/Nodes: No enlarged mediastinal, hilar, or axillary lymph nodes. Small cardio phrenic angle lymph nodes appear increased from previous exam. The largest measures 1 cm, image 40/2. Previously 4 mm. Thyroid gland, trachea, and esophagus demonstrate no significant findings. Lungs/Pleura: There is a new small right pleural effusion. Asymmetric elevation of the right hemidiaphragm is again noted. There is overlying passive atelectasis and volume loss from the right lower lobe. Right apical lung nodule measures 5 mm  and is new from previous exam, image 21/4. Tiny nodule within the medial right lower lobe is also new measuring 3 mm, image 75/4. Musculoskeletal: No chest wall mass or suspicious bone lesions identified. CT ABDOMEN PELVIS FINDINGS Hepatobiliary: Extensive liver metastases involving both lobes of the liver are identified the confluent mass within the right lobe of liver measures 14.7 x 6.6 by 18.7 cm, image 54/2. On the previous exam this measured 11.8 x 5.2 by 11.8 cm. Posterior segment 8 lesion measures 4.9 by 3.2 cm, image 47/2. Previously 3.6 x 2.5 cm. Multiple new lesions are identified. Lesion within segment 6 appears new measuring 2.7 x 2.4 cm, image 72/2. Lesion within the subcapsular left lobe is new measuring 3.8 x 2.8 cm, image 54/2. Mild pericholecystic fluid noted. No gallstones. No biliary dilatation. Pancreas: Unremarkable. No pancreatic ductal dilatation or surrounding inflammatory changes. Spleen: There are multiple new wedge-shaped areas of peripheral low attenuation within the spleen compatible with splenic infarcts. Adrenals/Urinary Tract: The adrenal glands appear normal. New large area of low attenuation within the inferior pole of left kidney is new measuring 4.2 by 3.1 by 4.5 cm. Wedge-shaped area of low attenuation and cortical volume loss within the posterior right kidney is new compatible with infarct, image 68/2. No hydronephrosis. Urinary bladder appears normal. Stomach/Bowel: Stomach is normal. No abnormal small bowel dilatation identified. No pathologic dilatation of the colon. Vascular/Lymphatic: Aortic atherosclerosis. Small retroperitoneal lymph nodes are identified no adenopathy within the abdomen. No pelvic or inguinal adenopathy. Reproductive: Anteverted uterus.  No discrete mass identified. Other: Enhancing lesion within the left pelvic sidewall is again noted measuring 4.9 by 1.9 cm, image 108/2. Interval development of moderate volume ascites. New soft tissue peritoneal nodule  in the right upper quadrant of the abdomen measures 1.6 cm, image 77/2. New loculated ascites overlies the right lobe of liver, image 47/2. Musculoskeletal: Mild erosive changes involving the left sacrum secondary to the left pelvic sidewall mass  identified, image 124/5. IMPRESSION: 1. Interval progression of disease. 2. Significant progression of liver metastasis. 3. New abdominal ascites with loculated fluid overlying the liver and at least 1 peritoneal nodule in the right upper quadrant of the abdomen compatible with peritoneal carcinomatosis. 4. New pulmonary nodule in the right upper lobe and right lower lobe compatible with metastatic disease. 5. Multiple splenic and renal infarcts.  New from previous exam. 6. Similar appearance of left pelvic sidewall tumor with mild erosive changes involving the left sacrum. 7. These results will be called to the ordering clinician or representative by the Radiologist Assistant, and communication documented in the PACS or zVision Dashboard. Electronically Signed   By: Kerby Moors M.D.   On: 12/24/2017 03:02     EKG: Independently reviewed.  Sinus rhythm, QTC 476, low voltage, tachycardia, poor R wave progression   Assessment/Plan Principal Problem:   Acute metabolic encephalopathy Active Problems:   Essential hypertension   AF (paroxysmal atrial fibrillation) (HCC)   Cerebral thrombosis with cerebral infarction   Stroke (cerebrum) (HCC)   Metastasis to liver (HCC)   Metastatic squamous cell carcinoma involving liver & sacrum with unknown primary site    Hypokalemia   Anemia due to antineoplastic chemotherapy   MDD (major depressive disorder), recurrent severe, without psychosis ([place])   Severe protein-calorie malnutrition (HCC)   Sepsis (HCC)   Hyponatremia   Thrombocytopenia (HCC)   Acute metabolic encephalopathy: Etiology is not clear. CT-head negative. Likely due to multifactorial etiology, including sepsis and electrolyte disturbance.  Patient  has had liver metastasized disease, and mildly abnormal liver function, will need to rule out hepatic encephalopathy.  Pending ammonia level now.  Patient has worsening thrombocytopenia, will need to rule out TTP.  -Admitted to telemetry bed as inpatient - Started vancomycin and Zosyn for sepsis -Frequent neuro check -Peripheral smear to rule out schistocytes -Follow-up ammonia level -IV fluid and supportive care  Sepsis: Patient meets the critical for sepsis with leukocytosis, fever, tachycardia and tachypnea.  Currently hemodynamically stable.  The source of infection is not clear. -Empiric antibiotics, vancomycin and Zosyn -Follow-up urinalysis, urine culture, blood culture -will get Procalcitonin and trend lactic acid levels per sepsis protocol. -IVF: 2L of NS bolus in ED, followed by 100 cc/h--> bolus as needed.  Thrombocytopenia: Patient has a baseline thrombocytopenia, which has worsened today, likely due to ongoing infection and sepsis.  No bleeding tendency. -Type and screen, transfusion as needed -Follow-up LDH, peripheral smear as above -Follow-up with CBC  Metastatic squamous cell carcinoma involving liver & sacrum with unknown primary site: Metastasis to liver.  Patient used to be followed up by Dr. Addison Bailey. She states that Dr. Burr Medico will f/u her from now. She stopped chemotherapy in May. -will add Dr. Burr Medico to treatment team.  HTN: Blood pressure 141/86.  Patient is not taking blood pressure medications at home. -IV hydralazine prn  AF (paroxysmal atrial fibrillation) ([place]): CHA2DS2-VASc Score is 4, needs oral anticoagulation. Patient is on Xarelto at home. Heart rate is 90-120s -will hold Xarelto due to severe thrombocytopenia  Cerebral thrombosis with cerebral infarction: -hold xarelto as above -Continue flecainide  Hypokalemia: -Repleted -Check magnesium level  Anemia due to antineoplastic chemotherapy: Hemoglobin dropped from 9.2-8.0.  No active bleeding. -f/u  by CBC  MDD (major depressive disorder), recurrent severe, without psychosis ([place]): -Continue Celexa  Severe protein-calorie malnutrition ([place]): -Protein supplement  Hyponatremia: Sodium 128.  Likely due to decreased oral intake. -IV fluid as above - Check TSH   Inpatient status:  #  Patient requires inpatient status due to high intensity of service, high risk for further deterioration and high frequency of surveillance required.  I certify that at the point of admission it is my clinical judgment that the patient will require inpatient hospital care spanning beyond 2 midnights from the point of admission.  . This patient has multiple chronic comorbidities including metastatic squamous cell carcinoma involving liver & sacrum with unknown primary site (stoped chemo in May), hypertension, hyperlipidemia, stroke, GERD, depression, atrial fibrillation on Xarelto . Now patient has presenting symptoms include AMS . The worrisome physical exam findings include AMS, fever, tachycardia, tachypnea. . The initial radiographic and laboratory data are worrisome because of leukocytosis, worsening anemia, thrombocytopenia, hyponatremia, hypokalemia, . Current medical needs: please see my assessment and plan . Predictability of an adverse outcome (risk): Patient is septic with unclear source of infection.  She is immunosuppressed, has multiple comorbidities, at high risk of deteriorating.   DVT ppx: SCD Code Status: Full code Family Communication:  Yes, patient's husband at bed side Disposition Plan:  Anticipate discharge back to previous home environment Consults called:  none Admission status: Obs / tele    Date of Service 12/25/2017    Ivor Costa [facility] Pager [phone]  If 7PM-7AM, please contact night-coverage [email or web address] Password [facility] 12/25/2017, 4:29 AM

## 2017-12-25 NOTE — ED Provider Notes (Signed)
Care assumed from [facility], [place].  Please see her full H&P.  In short,  Cindy Faulkner is a 52 y.o. female presents for progressive weakness and confusion x3 days.  Patient has widespread metastasis from cancer of unknown origin.  Last chemotherapy was in May.  She is taking Xarelto for A. fib.  Physical Exam  BP (!) 141/86   Pulse 98   Temp 98.3 F (36.8 C) (Oral)   Resp 18   Ht 5\' 7"  (1.702 m)   Wt 99.8 kg   LMP 02/12/2012   SpO2 93%   BMI 34.46 kg/m   Physical Exam  ED Course/Procedures   Clinical Course as of Dec 25 137  Sat Dec 24, 2017  2240 EKG is sinus tach at a rate of 117 normal intervals no acute ST-T changes.   [MB]  2301 Patient with known squamous cell metastatic cancer here with progressive weakness fatigue and confusion.  Consult is been going on over a month ago acutely worsened over the last 3 days.  She is having difficulty even with transfers and walking now.  She has not been taking any solid food for a while.  She is getting some screening labs chest x-ray CT head and will likely need admission for continued management.   [MB]  2345 Plan: Pending CT scan of head and UA.  Patient will need admission for altered mental status.   [HM]  Sun Dec 25, 2017  0136 Discussed with Dr. Blaine Hamper who will admit   [HM]    Clinical Course User Index [HM] Loraina Stauffer, Jarrett Soho, PA-C [MB] Hayden Rasmussen, MD    Procedures  MDM   CT scan is without acute abnormality including no specific brain metastasis or cerebral edema.  As patient has liver metastasis, ammonia will be collected.  Patient pending UA and ammonia levels upon discussion with hospitalist.  They agree to admit.   Confusion  Generalized weakness  Thrombocytopenia [facility])       Abigail Butts, PA-C 12/25/17 0140    Orpah Greek, MD 12/25/17 [phone]

## 2017-12-25 NOTE — Progress Notes (Signed)
Pharmacy Antibiotic Note  Cindy Faulkner is a 52 y.o. female admitted on 12/24/2017 with sepsis.  Pharmacy has been consulted for piperacillin/tazobactam and vancomycin dosing.  Pt has PMH significant for metastatic squamous cell carcinoma - no chemotherapy since May 2019. Broad spectrum antibiotics started on admission for sepsis of unknown origin.   Today, 12/25/17  (11/2) WBC 24.1  SCr stable WNL, CrCl ~95 mL/min  PCT 0.85  Lactate 1.7  Plan:  Piperacillin/tazobactam 3.375 g IV q8h EI  Vancomycin 2000 mg loading dose followed by 750 mg IV q12h for estimated AUC of 435  Goal AUC 400-500   Follow renal function, clinical course, culture data  Check vancomycin levels once at steady state if indicated   Height: 5\' 7"  (170.2 cm) Weight: 220 lb (99.8 kg) IBW/kg (Calculated) : 61.6  Temp (24hrs), Avg:98.3 F (36.8 C), Min:98.2 F (36.8 C), Max:98.3 F (36.8 C)  Recent Labs  Lab 12/24/17 2107 12/24/17 2141 12/25/17 0219 12/25/17 0619  WBC 24.1*  --   --   --   CREATININE  --  0.58  --  0.50  LATICACIDVEN  --   --  1.8 1.7    Estimated Creatinine Clearance: 97.6 mL/min (by C-G formula based on SCr of 0.5 mg/dL).    No Known Allergies  Antimicrobials this admission: 11/3 piperaicillin/tazobactam >>  11/3 vancomycin >>   Dose adjustments this admission:   Microbiology results: 11/3 BCx: Sent 11/3 UCx: Sent  Thank you for allowing pharmacy to be a part of this patient's care.  Lenis Noon, PharmD Clinical Pharmacist 12/25/2017 8:25 AM

## 2017-12-26 LAB — URINE CULTURE: Culture: NO GROWTH

## 2017-12-26 LAB — COMPREHENSIVE METABOLIC PANEL
ALT: 23 U/L (ref 0–44)
AST: 38 U/L (ref 15–41)
Albumin: 1.8 g/dL — ABNORMAL LOW (ref 3.5–5.0)
Alkaline Phosphatase: 154 U/L — ABNORMAL HIGH (ref 38–126)
Anion gap: 7 (ref 5–15)
BILIRUBIN TOTAL: 1.6 mg/dL — AB (ref 0.3–1.2)
BUN: 11 mg/dL (ref 6–20)
CALCIUM: 7.8 mg/dL — AB (ref 8.9–10.3)
CO2: 23 mmol/L (ref 22–32)
CREATININE: 0.94 mg/dL (ref 0.44–1.00)
Chloride: 100 mmol/L (ref 98–111)
GFR calc Af Amer: >60 mL/min (ref >60)
Glucose, Bld: 169 mg/dL — ABNORMAL HIGH (ref 70–99)
Potassium: 3.7 mmol/L (ref 3.5–5.1)
Sodium: 130 mmol/L — ABNORMAL LOW (ref 135–145)
TOTAL PROTEIN: 5.1 g/dL — AB (ref 6.5–8.1)

## 2017-12-26 LAB — FERRITIN: FERRITIN: 4414 ng/mL — AB (ref 11–307)

## 2017-12-26 LAB — GLUCOSE, CAPILLARY
GLUCOSE-CAPILLARY: 140 mg/dL — AB (ref 70–99)
GLUCOSE-CAPILLARY: 148 mg/dL — AB (ref 70–99)
Glucose-Capillary: 141 mg/dL — ABNORMAL HIGH (ref 70–99)
Glucose-Capillary: 202 mg/dL — ABNORMAL HIGH (ref 70–99)

## 2017-12-26 LAB — CBC WITH DIFFERENTIAL/PLATELET
Abs Immature Granulocytes: 0.34 10*3/uL — ABNORMAL HIGH (ref 0.00–0.07)
BASOS PCT: 0 %
Basophils Absolute: 0 10*3/uL (ref 0.0–0.1)
EOS PCT: 0 %
Eosinophils Absolute: 0 10*3/uL (ref 0.0–0.5)
HCT: 28.8 % — ABNORMAL LOW (ref 36.0–46.0)
Hemoglobin: 8.9 g/dL — ABNORMAL LOW (ref 12.0–15.0)
Immature Granulocytes: 2 %
Lymphocytes Relative: 7 %
Lymphs Abs: 1.5 10*3/uL (ref 0.7–4.0)
MCH: 27.8 pg (ref 26.0–34.0)
MCHC: 30.9 g/dL (ref 30.0–36.0)
MCV: 90 fL (ref 80.0–100.0)
MONO ABS: 1.1 10*3/uL — AB (ref 0.1–1.0)
MONOS PCT: 5 %
Neutro Abs: 17.4 10*3/uL — ABNORMAL HIGH (ref 1.7–7.7)
Neutrophils Relative %: 86 %
PLATELETS: 28 10*3/uL — AB (ref 150–400)
RBC: 3.2 MIL/uL — AB (ref 3.87–5.11)
RDW: 18.9 % — ABNORMAL HIGH (ref 11.5–15.5)
WBC: 20.4 10*3/uL — AB (ref 4.0–10.5)
nRBC: 0 % (ref 0.0–0.2)

## 2017-12-26 LAB — VANCOMYCIN, RANDOM: VANCOMYCIN RM: 16

## 2017-12-26 LAB — IRON AND TIBC
IRON: 25 ug/dL — AB (ref 28–170)
Saturation Ratios: 23 % (ref 10.4–31.8)
TIBC: 108 ug/dL — ABNORMAL LOW (ref 250–450)
UIBC: 83 ug/dL

## 2017-12-26 LAB — TYPE AND SCREEN
ABO/RH(D): A POS
ANTIBODY SCREEN: NEGATIVE
Unit division: 0
Unit division: 0

## 2017-12-26 LAB — BPAM RBC
Blood Product Expiration Date: 201911262359
Blood Product Expiration Date: 201911282359
ISSUE DATE / TIME: 201911031203
ISSUE DATE / TIME: 201911031557
UNIT TYPE AND RH: 6200
Unit Type and Rh: 6200

## 2017-12-26 LAB — MAGNESIUM: MAGNESIUM: 1.6 mg/dL — AB (ref 1.7–2.4)

## 2017-12-26 LAB — PATHOLOGIST SMEAR REVIEW

## 2017-12-26 LAB — T4, FREE: FREE T4: 1.18 ng/dL (ref 0.82–1.77)

## 2017-12-26 LAB — PHOSPHORUS: PHOSPHORUS: 2.3 mg/dL — AB (ref 2.5–4.6)

## 2017-12-26 MED ORDER — ALBUMIN HUMAN 25 % IV SOLN
12.5000 g | Freq: Four times a day (QID) | INTRAVENOUS | Status: DC
  Administered 2017-12-26 – 2017-12-29 (×11): 12.5 g via INTRAVENOUS
  Filled 2017-12-26 (×11): qty 50

## 2017-12-26 MED ORDER — VANCOMYCIN VARIABLE DOSE PER UNSTABLE RENAL FUNCTION (PHARMACIST DOSING)
Status: DC
  Filled 2017-12-26: qty 1

## 2017-12-26 MED ORDER — VANCOMYCIN HCL IN DEXTROSE 750-5 MG/150ML-% IV SOLN
750.0000 mg | Freq: Once | INTRAVENOUS | Status: AC
  Administered 2017-12-26: 750 mg via INTRAVENOUS
  Filled 2017-12-26: qty 150

## 2017-12-26 MED ORDER — FENTANYL CITRATE (PF) 100 MCG/2ML IJ SOLN
12.5000 ug | INTRAMUSCULAR | Status: DC | PRN
  Administered 2017-12-27 – 2017-12-28 (×2): 12.5 ug via INTRAVENOUS
  Filled 2017-12-26 (×2): qty 2

## 2017-12-26 MED ORDER — MAGNESIUM SULFATE 2 GM/50ML IV SOLN
2.0000 g | Freq: Once | INTRAVENOUS | Status: AC
  Administered 2017-12-26: 2 g via INTRAVENOUS
  Filled 2017-12-26: qty 50

## 2017-12-26 MED ORDER — VANCOMYCIN HCL IN DEXTROSE 750-5 MG/150ML-% IV SOLN
750.0000 mg | Freq: Two times a day (BID) | INTRAVENOUS | Status: DC
  Filled 2017-12-26: qty 150

## 2017-12-26 MED ORDER — LORAZEPAM 2 MG/ML IJ SOLN
0.2500 mg | INTRAMUSCULAR | Status: DC | PRN
  Administered 2017-12-28: 14:00:00 via INTRAVENOUS
  Filled 2017-12-26: qty 1

## 2017-12-26 MED ORDER — METOCLOPRAMIDE HCL 5 MG/ML IJ SOLN
10.0000 mg | Freq: Three times a day (TID) | INTRAMUSCULAR | Status: DC
  Administered 2017-12-26 – 2017-12-28 (×7): 10 mg via INTRAVENOUS
  Filled 2017-12-26 (×7): qty 2

## 2017-12-26 MED ORDER — ONDANSETRON HCL 4 MG/2ML IJ SOLN
4.0000 mg | Freq: Three times a day (TID) | INTRAMUSCULAR | Status: DC
  Administered 2017-12-26 – 2017-12-28 (×6): 4 mg via INTRAVENOUS
  Filled 2017-12-26 (×6): qty 2

## 2017-12-26 MED ORDER — OLANZAPINE 5 MG PO TBDP
2.5000 mg | ORAL_TABLET | Freq: Every day | ORAL | Status: DC
  Administered 2017-12-26 – 2017-12-28 (×3): 2.5 mg via ORAL
  Filled 2017-12-26 (×3): qty 0.5

## 2017-12-26 MED ORDER — VANCOMYCIN VARIABLE DOSE PER UNSTABLE RENAL FUNCTION (PHARMACIST DOSING): Status: DC

## 2017-12-26 MED ORDER — DEXAMETHASONE SODIUM PHOSPHATE 10 MG/ML IJ SOLN
10.0000 mg | Freq: Once | INTRAMUSCULAR | Status: AC
  Administered 2017-12-26: 10 mg via INTRAVENOUS
  Filled 2017-12-26: qty 1

## 2017-12-26 NOTE — Progress Notes (Signed)
Pharmacy Antibiotic Note  Cindy Faulkner is a 52 y.o. female admitted on 12/24/2017 with sepsis.  Pharmacy has been consulted for piperacillin/tazobactam and vancomycin dosing.  Pt has PMH significant for metastatic squamous cell carcinoma - no chemotherapy since May 2019. Broad spectrum antibiotics started on admission for sepsis of unknown origin.   Today, 12/26/17  WBC 20.4  Afebrile  SCr nearly doubled, unable to determine UOP  PCT 0.85 initially  Lactate 1.7 initially  2nd shift update:  Vancomycin random level = 16 mcg/ml  Plan:  As vancomycin random level (drawn prior to next dose due) is within desired range, will give a one-time Vancomycin 750mg  IV dose tonight.    F/U AM SCr on 11/5 to assess renal function  11/5 vancomycin placeholder order and random level prior to the next dose- if level is above expected "trough" range, will reevaluate 11/5, if at or below expected range may consider one-time dose  Follow renal function, clinical course, culture data    Height: 5\' 7"  (170.2 cm) Weight: 220 lb (99.8 kg) IBW/kg (Calculated) : 61.6  Temp (24hrs), Avg:98.1 F (36.7 C), Min:97.7 F (36.5 C), Max:98.4 F (36.9 C)  Recent Labs  Lab 12/24/17 2107 12/24/17 2141 12/25/17 0219 12/25/17 0619 12/26/17 0636 12/26/17 1654  WBC 24.1*  --   --  16.2* 20.4*  --   CREATININE  --  0.58  --  0.50 0.94  --   LATICACIDVEN  --   --  1.8 1.7  --   --   VANCORANDOM  --   --   --   --   --  16    Estimated Creatinine Clearance: 83.1 mL/min (by C-G formula based on SCr of 0.94 mg/dL).    No Known Allergies  Antimicrobials this admission: 11/3 piperaicillin/tazobactam >>  11/3 vancomycin >>   Dose adjustments this admission: 11/4 d/c vancomycin standing order for significantly increased SCr 11/4 @ 16:54 = 16 mcg/ml : Vanc 750mg  IV x 1 dose 11/5 @ 05:00 = ________________  Microbiology results: 11/3 BCx: NG x 1 day 11/3 UCx: NG (final) 11/3 C diff: negative 11/3  stool: sent  Thank you for allowing pharmacy to be a part of this patient's care.  Everette Rank, PharmD Clinical Pharmacist 12/26/2017 5:39 PM

## 2017-12-26 NOTE — Progress Notes (Signed)
Pharmacy Antibiotic Note  Cindy Faulkner is a 52 y.o. female admitted on 12/24/2017 with sepsis.  Pharmacy has been consulted for piperacillin/tazobactam and vancomycin dosing.  Pt has PMH significant for metastatic squamous cell carcinoma - no chemotherapy since May 2019. Broad spectrum antibiotics started on admission for sepsis of unknown origin.   Today, 12/26/17  WBC 20.4  Afebrile  SCr nearly doubled, unable to determine UOP  PCT 0.85 initially  Lactate 1.7 initially  Plan:  Continue piperacillin/tazobactam 3.375 g IV q8h EI for now, may need to dose as ESRD for ARF but will follow SCr to determine  Vancomycin 2000 mg loading dose followed by 750 mg IV q12h for estimated AUC of 435  Goal AUC 400-500   11/4 vancomycin placeholder order and random level prior to the next dose- if level is above expected "trough" range, will reevaluate 11/5, if at or below expected range may consider one-time dose  Follow renal function, clinical course, culture data    Height: 5\' 7"  (170.2 cm) Weight: 220 lb (99.8 kg) IBW/kg (Calculated) : 61.6  Temp (24hrs), Avg:98.2 F (36.8 C), Min:97.7 F (36.5 C), Max:98.6 F (37 C)  Recent Labs  Lab 12/24/17 2107 12/24/17 2141 12/25/17 0219 12/25/17 0619 12/26/17 0636  WBC 24.1*  --   --  16.2* 20.4*  CREATININE  --  0.58  --  0.50 0.94  LATICACIDVEN  --   --  1.8 1.7  --     Estimated Creatinine Clearance: 83.1 mL/min (by C-G formula based on SCr of 0.94 mg/dL).    No Known Allergies  Antimicrobials this admission: 11/3 piperaicillin/tazobactam >>  11/3 vancomycin >>   Dose adjustments this admission: 11/4 d/c vancomycin standing order for significantly increased SCr  Microbiology results: 11/3 BCx: Sent 11/3 UCx: Sent 11/3 C diff: negative 11/3 stool: sent  Thank you for allowing pharmacy to be a part of this patient's care.  Ulice Dash D, PharmD Clinical Pharmacist 12/26/2017 10:04 AM

## 2017-12-26 NOTE — Progress Notes (Signed)
[facility]  [name] is an active [place] patient with [facility] prior to this admission for Pharmacy and [facility] services.   [facility] team will follow and support DC to home when ordered.  If patient discharges after hours, please call [phone].   Cindy Faulkner 12/26/2017, 8:58 AM

## 2017-12-26 NOTE — Progress Notes (Addendum)
Request to IR for therapeutic paracentesis for worsening ascites in setting of metastatic disease.  Chart review shows INR 3.10, PT 31.5, PTT 45, D-dimer 16.01 yesterday. Hgb 8.9 post infusion, plt 28 today. Given patient's current coagulopathy/thrombocytopenia therapeutic paracentesis is contraindicated due to high risk of bleeding. Patient was discussed with Dr. Laurence Ferrari who is in agreement that the risks outweigh the benefits of the procedure at this time.  I will order an INR for tomorrow morning in addition to her daily CBC and we will reassess for possible therapeutic paracentesis tomorrow.  Please call IR with questions or concerns.  Cindy Norse, PA-C

## 2017-12-26 NOTE — Progress Notes (Signed)
PROGRESS NOTE  Cindy Faulkner WNU:[id number] DOB: March 22, 1965 DOA: 12/24/2017 PCP: Lennie Odor, PA-C  Brief History:  52 y.o. female with medical history significant of metastatic squamous cell carcinoma involving liver & sacrum with unknown primary site (stoped chemo in May), hypertension, hyperlipidemia, stroke, GERD, depression, atrial fibrillation on Xarelto, who presents with altered mental status.  According to the patient's husband, the patient has had a functional decline over the past 2 weeks, now having difficulty getting out of bed without assistance.  In the past 2 to 3 days prior to admission, the patient has had worsening oral intake and increasing lethargy.  The patient has had on and off low-grade temperatures for the past week with a T-max of 101.0 F approximately 3 days prior to this admission.  There has been no headaches, neck pain, chest pain, coughing, hemoptysis, diarrhea, dysuria, hematuria, hematochezia, melena.  Upon presentation, the patient was noted to have sodium 128 and WBC 24.1 and hemoglobin 8.0.  There is concern for infectious source.  Patient was started on empiric vancomycin and Zosyn. Pt admitted for further management.  Assessment/Plan: Acute metabolic encephalopathy likely 2/2 worsening metastatic CA with possible sepsis of unknown origin -Multifactorial including infectious etiology, hyponatremia, dehydration, pancytopenia, cancer of unknown origin ?Cervical Ca as primary -Currently afebrile, with worsening leukocytosis -Urinalysis negative for pyuria, UC with no growth -Stool culture pending -BC X 2, NGTD -Chest x-ray personally reviewed--no consolidations -Personally reviewed EKG--sinus rhythm, no ST-T wave changes -CT head negative for any mets -Continue empiric IV Zosyn, Vancomycin -Palliative consulted due to very poor prognosis, made DNR  Coagulopathy/Thrombocytopenia -No signs of active bleeding -Likely due to sepsis resulting in  ??DIC -Presenting INR 3.66, PTT 51, fibrinogen 226 -D-dimer 14.95 -S/P transfused 2 units PRBC -Hematology/oncology on board, await further recs  Intractable nausea/vomiting/abdominal distension/fullness -12/23/2017 CT abdomen and pelvis--interval progression of malignancy with progression of liver metastasis; new abdominal ascites with loculated fluid over the liver and peritoneal nodule in the right upper quadrant; new right upper lobe and right lower lobe lung nodule, multiple splenic and renal infarcts Scheduled reglan, zyprexa for nausea, decadron as per palliative Palliative recommending therapeutic/comfort paracentesis. IR consulted, unable to do today due to coagulopathy. IR will re-evaluate tomorrow. Palliative started pt on IV albumin until after paracentesis Pain management with fentanyl  Cancer of unknown origin likely ?cervical CA -Follows Dr. Alvy Bimler -mets to liver and sacrum -last Keytruda 2-3 weeks prior to admission -Holding off all chemo for now  Paroxysmal atrial fibrillation -Holding rivaroxaban secondary to thrombocytopenia -Continue flecainide -Presently in sinus rhythm  Hypokalemia/Hypomagnesemia -replete prn  Hyponatremia -due to poor solute intake and dehydration -continue IVF  Anemia due to antineoplastic chemotherapy This is likely anemia of chronic disease -Serum I34 elevated, folic acid level low -Iron 25, sats 23, ferritin 4,414  MDD (major depressive disorder), recurrent severe, without psychosis ([place]): -Continue Celexa  Severe protein-calorie malnutrition ([place]): -Protein supplement    Disposition Plan: TBD  Family Communication: Daughter updated at bedside 11/4  Consultants:   Med Onc, palliative  Code Status:  DNR  DVT Prophylaxis:  SCDs   Procedures: None  Antibiotics: vanco 11/2>>> Zosyn 11/2>>>    Subjective: Pt very lethargic, poor appetite, still c/o abdominal fullness, with some pain. Very poor prognosis. Discussed  at length with patient and daughter  Objective: Vitals:   12/25/17 2018 12/26/17 0500 12/26/17 1358 12/26/17 1700  BP: (!) 142/89 132/80 (!) 135/95 130/86  Pulse: 91  89 87   Resp: 16 18 20    Temp: 98.1 F (36.7 C) 98.4 F (36.9 C) 98 F (36.7 C)   TempSrc: Oral Oral    SpO2: 99% 98% 97%   Weight:      Height:        Intake/Output Summary (Last 24 hours) at 12/26/2017 1726 Last data filed at 12/26/2017 1700 Gross per 24 hour  Intake 2803.15 ml  Output -  Net 2803.15 ml   Weight change:  Exam:  General: Very lethargic, chronically ill appearing   Cardiovascular: S1, S2 present  Respiratory: CTAB  Abdomen: Soft, mild generalized tenderness, +distended, bowel sounds present  Musculoskeletal: No bilateral pedal edema noted  Skin: Normal  Psychiatry: No new focal neurologic deficits noted    Data Reviewed: I have personally reviewed following labs and imaging studies Basic Metabolic Panel: Recent Labs  Lab 12/24/17 2141 12/25/17 0245 12/25/17 0619 12/26/17 0636  NA 128*  --  130* 130*  K 3.3*  --  3.3* 3.7  CL 93*  --  98 100  CO2 24  --  24 23  GLUCOSE 128*  --  145* 169*  BUN 9  --  9 11  CREATININE 0.58  --  0.50 0.94  CALCIUM 8.2*  --  7.7* 7.8*  MG  --  1.3*  --  1.6*  PHOS  --   --   --  2.3*   Liver Function Tests: Recent Labs  Lab 12/24/17 2141 12/25/17 0619 12/26/17 0636  AST 47* 39 38  ALT 24 20 23   ALKPHOS 168* 138* 154*  BILITOT 1.4* 1.4* 1.6*  PROT 5.8* 5.0* 5.1*  ALBUMIN 2.0* 1.7* 1.8*   Recent Labs  Lab 12/24/17 2141  LIPASE 18   Recent Labs  Lab 12/25/17 1215  AMMONIA 19   Coagulation Profile: Recent Labs  Lab 12/25/17 0047 12/25/17 1215  INR 3.66 3.10   CBC: Recent Labs  Lab 12/24/17 2107 12/25/17 0047 12/25/17 0619 12/25/17 1215 12/26/17 0636  WBC 24.1*  --  16.2*  --  20.4*  NEUTROABS  --   --  14.1*  --  17.4*  HGB 8.0*  --  6.9*  --  8.9*  HCT 27.2*  --  23.3*  --  28.8*  MCV 89.8  --  90.0  --   90.0  PLT PLATELET CLUMPS NOTED ON SMEAR, COUNT APPEARS DECREASED 27* 30* 40* 28*   Cardiac Enzymes: No results for input(s): CKTOTAL, CKMB, CKMBINDEX, TROPONINI in the last 168 hours. BNP: Invalid input(s): POCBNP CBG: Recent Labs  Lab 12/25/17 1727 12/25/17 2014 12/26/17 0732 12/26/17 1143 12/26/17 1553  GLUCAP 161* 129* 141* 140* 148*   HbA1C: No results for input(s): HGBA1C in the last 72 hours. Urine analysis:    Component Value Date/Time   COLORURINE BROWN (A) 12/25/2017 0630   APPEARANCEUR TURBID (A) 12/25/2017 0630   LABSPEC 1.020 12/25/2017 0630   PHURINE 6.0 12/25/2017 0630   GLUCOSEU 100 (A) 12/25/2017 0630   HGBUR NEGATIVE 12/25/2017 0630   BILIRUBINUR SMALL (A) 12/25/2017 0630   KETONESUR NEGATIVE 12/25/2017 0630   PROTEINUR NEGATIVE 12/25/2017 0630   UROBILINOGEN 0.2 11/27/2010 1122   NITRITE NEGATIVE 12/25/2017 0630   LEUKOCYTESUR NEGATIVE 12/25/2017 0630   Sepsis Labs: @LABRCNTIP (procalcitonin:4,lacticidven:4) ) Recent Results (from the past 240 hour(s))  Culture, blood (Routine X 2) w Reflex to ID Panel     Status: None (Preliminary result)   Collection Time: 12/25/17  2:19 AM  Result Value  Ref Range Status   Specimen Description   Final    BLOOD RIGHT PORTA CATH Performed at [facility] [address], [place], [place] [address]    Special Requests   Final    BOTTLES DRAWN AEROBIC AND ANAEROBIC Blood Culture adequate volume Performed at [facility] [address], [place], [place] [address]    Culture   Final    NO GROWTH 1 DAY Performed at [facility], [place] [address] [place], [place] [address]    Report Status PENDING  Incomplete  Culture, blood (Routine X 2) w Reflex to ID Panel     Status: None (Preliminary result)   Collection Time: 12/25/17  2:19 AM  Result Value Ref Range Status   Specimen Description   Final    BLOOD LEFT FOREARM Performed at [facility]  [address], [place], [place] [address]    Special Requests   Final    BOTTLES DRAWN AEROBIC AND ANAEROBIC Blood Culture adequate volume Performed at [facility] [address] [place], [place] [address]    Culture   Final    NO GROWTH 1 DAY Performed at [facility], [place] [address] [place], [place] [address]    Report Status PENDING  Incomplete  Urine Culture     Status: None   Collection Time: 12/25/17  6:30 AM  Result Value Ref Range Status   Specimen Description   Final    URINE, CLEAN CATCH Performed at [facility], [place] [address], [place], [place] [address]    Special Requests   Final    NONE Performed at [facility], [place] [address] [place], [place] [address]    Culture   Final    NO GROWTH Performed at [facility], [place] [address] [place], [place] [address]    Report Status 12/26/2017 FINAL  Final  C difficile quick scan w PCR reflex     Status: None   Collection Time: 12/25/17  3:47 PM  Result Value Ref Range Status   C Diff antigen NEGATIVE NEGATIVE Final   C Diff toxin NEGATIVE NEGATIVE Final   C Diff interpretation No C. difficile detected.  Final    Comment: Performed at [facility], [place] [address], [place], [place] [address]     Scheduled Meds: . citalopram  20 mg Oral Daily  . feeding supplement (ENSURE ENLIVE)  237 mL Oral BID BM  . flecainide  50 mg Oral Daily  . insulin aspart  0-9 Units Subcutaneous TID WC  . metoCLOPramide (REGLAN) injection  10 mg Intravenous TID  . OLANZapine zydis  2.5 mg Oral QHS  . ondansetron (ZOFRAN) IV  4 mg Intravenous TID  . pantoprazole  40 mg Oral Daily  . protein supplement shake  11 oz Oral BID BM  . vancomycin variable dose per unstable renal function (pharmacist dosing)   Does not apply See admin instructions   Continuous Infusions: . 0.9 % NaCl with KCl 20 mEq / L 100 mL/hr at 12/26/17 1718  . albumin human    .  piperacillin-tazobactam (ZOSYN)  IV 3.375 g (12/26/17 1715)    Procedures/Studies: Dg Chest 2 View  Result Date: 12/24/2017 CLINICAL DATA:  Dizziness and lethargy EXAM: CHEST - 2 VIEW COMPARISON:  November 15, 2017 FINDINGS: Stable right Port-A-Cath. Elevation the right hemidiaphragm has worsened in the interval. The cardiomediastinal silhouette is normal. No other  acute abnormalities. IMPRESSION: Elevation of the right hemidiaphragm, increased in the interval. No other acute abnormalities. Electronically Signed   By: Dorise Bullion III M.D   On: 12/24/2017 22:25   Ct Head Wo Contrast  Result Date: 12/25/2017 CLINICAL DATA:  Metastatic disease of unknown primary. Weakness and dizziness since 1600 hours. EXAM: CT HEAD WITHOUT CONTRAST TECHNIQUE: Contiguous axial images were obtained from the base of the skull through the vertex without intravenous contrast. COMPARISON:  MRI brain 10/24/2017 FINDINGS: Brain: No evidence of acute infarction, hemorrhage, hydrocephalus, extra-axial collection or mass lesion/mass effect. Vascular: No hyperdense vessel or unexpected calcification. Skull: Normal. Negative for fracture or focal lesion. Sinuses/Orbits: Retention cyst versus polyp noted within the right maxillary sinus as before. Other: None IMPRESSION: 1. No acute intracranial abnormalities. 2. No evidence for brain metastases. Electronically Signed   By: Kerby Moors M.D.   On: 12/25/2017 00:06   Ct Chest W Contrast  Result Date: 12/24/2017 CLINICAL DATA:  Metastatic cervical cancer.  Restaging. EXAM: CT CHEST, ABDOMEN, AND PELVIS WITH CONTRAST TECHNIQUE: Multidetector CT imaging of the chest, abdomen and pelvis was performed following the standard protocol during bolus administration of intravenous contrast. CONTRAST:  167mL OMNIPAQUE IOHEXOL 300 MG/ML  SOLN COMPARISON:  None. CT AP 10/20/2017 and CT chest 03/03/2017 FINDINGS: CT CHEST FINDINGS Cardiovascular: There is a right chest wall port a catheter  with tip at the cavoatrial junction. Normal heart size. No pericardial effusion. Mediastinum/Nodes: No enlarged mediastinal, hilar, or axillary lymph nodes. Small cardio phrenic angle lymph nodes appear increased from previous exam. The largest measures 1 cm, image 40/2. Previously 4 mm. Thyroid gland, trachea, and esophagus demonstrate no significant findings. Lungs/Pleura: There is a new small right pleural effusion. Asymmetric elevation of the right hemidiaphragm is again noted. There is overlying passive atelectasis and volume loss from the right lower lobe. Right apical lung nodule measures 5 mm and is new from previous exam, image 21/4. Tiny nodule within the medial right lower lobe is also new measuring 3 mm, image 75/4. Musculoskeletal: No chest wall mass or suspicious bone lesions identified. CT ABDOMEN PELVIS FINDINGS Hepatobiliary: Extensive liver metastases involving both lobes of the liver are identified the confluent mass within the right lobe of liver measures 14.7 x 6.6 by 18.7 cm, image 54/2. On the previous exam this measured 11.8 x 5.2 by 11.8 cm. Posterior segment 8 lesion measures 4.9 by 3.2 cm, image 47/2. Previously 3.6 x 2.5 cm. Multiple new lesions are identified. Lesion within segment 6 appears new measuring 2.7 x 2.4 cm, image 72/2. Lesion within the subcapsular left lobe is new measuring 3.8 x 2.8 cm, image 54/2. Mild pericholecystic fluid noted. No gallstones. No biliary dilatation. Pancreas: Unremarkable. No pancreatic ductal dilatation or surrounding inflammatory changes. Spleen: There are multiple new wedge-shaped areas of peripheral low attenuation within the spleen compatible with splenic infarcts. Adrenals/Urinary Tract: The adrenal glands appear normal. New large area of low attenuation within the inferior pole of left kidney is new measuring 4.2 by 3.1 by 4.5 cm. Wedge-shaped area of low attenuation and cortical volume loss within the posterior right kidney is new compatible with  infarct, image 68/2. No hydronephrosis. Urinary bladder appears normal. Stomach/Bowel: Stomach is normal. No abnormal small bowel dilatation identified. No pathologic dilatation of the colon. Vascular/Lymphatic: Aortic atherosclerosis. Small retroperitoneal lymph nodes are identified no adenopathy within the abdomen. No pelvic or inguinal adenopathy. Reproductive: Anteverted uterus.  No discrete mass identified. Other: Enhancing lesion within the left pelvic sidewall is  again noted measuring 4.9 by 1.9 cm, image 108/2. Interval development of moderate volume ascites. New soft tissue peritoneal nodule in the right upper quadrant of the abdomen measures 1.6 cm, image 77/2. New loculated ascites overlies the right lobe of liver, image 47/2. Musculoskeletal: Mild erosive changes involving the left sacrum secondary to the left pelvic sidewall mass identified, image 124/5. IMPRESSION: 1. Interval progression of disease. 2. Significant progression of liver metastasis. 3. New abdominal ascites with loculated fluid overlying the liver and at least 1 peritoneal nodule in the right upper quadrant of the abdomen compatible with peritoneal carcinomatosis. 4. New pulmonary nodule in the right upper lobe and right lower lobe compatible with metastatic disease. 5. Multiple splenic and renal infarcts.  New from previous exam. 6. Similar appearance of left pelvic sidewall tumor with mild erosive changes involving the left sacrum. 7. These results will be called to the ordering clinician or representative by the Radiologist Assistant, and communication documented in the PACS or zVision Dashboard. Electronically Signed   By: Kerby Moors M.D.   On: 12/24/2017 03:02   Ct Abdomen Pelvis W Contrast  Result Date: 12/24/2017 CLINICAL DATA:  Metastatic cervical cancer.  Restaging. EXAM: CT CHEST, ABDOMEN, AND PELVIS WITH CONTRAST TECHNIQUE: Multidetector CT imaging of the chest, abdomen and pelvis was performed following the standard  protocol during bolus administration of intravenous contrast. CONTRAST:  152mL OMNIPAQUE IOHEXOL 300 MG/ML  SOLN COMPARISON:  None. CT AP 10/20/2017 and CT chest 03/03/2017 FINDINGS: CT CHEST FINDINGS Cardiovascular: There is a right chest wall port a catheter with tip at the cavoatrial junction. Normal heart size. No pericardial effusion. Mediastinum/Nodes: No enlarged mediastinal, hilar, or axillary lymph nodes. Small cardio phrenic angle lymph nodes appear increased from previous exam. The largest measures 1 cm, image 40/2. Previously 4 mm. Thyroid gland, trachea, and esophagus demonstrate no significant findings. Lungs/Pleura: There is a new small right pleural effusion. Asymmetric elevation of the right hemidiaphragm is again noted. There is overlying passive atelectasis and volume loss from the right lower lobe. Right apical lung nodule measures 5 mm and is new from previous exam, image 21/4. Tiny nodule within the medial right lower lobe is also new measuring 3 mm, image 75/4. Musculoskeletal: No chest wall mass or suspicious bone lesions identified. CT ABDOMEN PELVIS FINDINGS Hepatobiliary: Extensive liver metastases involving both lobes of the liver are identified the confluent mass within the right lobe of liver measures 14.7 x 6.6 by 18.7 cm, image 54/2. On the previous exam this measured 11.8 x 5.2 by 11.8 cm. Posterior segment 8 lesion measures 4.9 by 3.2 cm, image 47/2. Previously 3.6 x 2.5 cm. Multiple new lesions are identified. Lesion within segment 6 appears new measuring 2.7 x 2.4 cm, image 72/2. Lesion within the subcapsular left lobe is new measuring 3.8 x 2.8 cm, image 54/2. Mild pericholecystic fluid noted. No gallstones. No biliary dilatation. Pancreas: Unremarkable. No pancreatic ductal dilatation or surrounding inflammatory changes. Spleen: There are multiple new wedge-shaped areas of peripheral low attenuation within the spleen compatible with splenic infarcts. Adrenals/Urinary Tract: The  adrenal glands appear normal. New large area of low attenuation within the inferior pole of left kidney is new measuring 4.2 by 3.1 by 4.5 cm. Wedge-shaped area of low attenuation and cortical volume loss within the posterior right kidney is new compatible with infarct, image 68/2. No hydronephrosis. Urinary bladder appears normal. Stomach/Bowel: Stomach is normal. No abnormal small bowel dilatation identified. No pathologic dilatation of the colon. Vascular/Lymphatic: Aortic atherosclerosis. Small  retroperitoneal lymph nodes are identified no adenopathy within the abdomen. No pelvic or inguinal adenopathy. Reproductive: Anteverted uterus.  No discrete mass identified. Other: Enhancing lesion within the left pelvic sidewall is again noted measuring 4.9 by 1.9 cm, image 108/2. Interval development of moderate volume ascites. New soft tissue peritoneal nodule in the right upper quadrant of the abdomen measures 1.6 cm, image 77/2. New loculated ascites overlies the right lobe of liver, image 47/2. Musculoskeletal: Mild erosive changes involving the left sacrum secondary to the left pelvic sidewall mass identified, image 124/5. IMPRESSION: 1. Interval progression of disease. 2. Significant progression of liver metastasis. 3. New abdominal ascites with loculated fluid overlying the liver and at least 1 peritoneal nodule in the right upper quadrant of the abdomen compatible with peritoneal carcinomatosis. 4. New pulmonary nodule in the right upper lobe and right lower lobe compatible with metastatic disease. 5. Multiple splenic and renal infarcts.  New from previous exam. 6. Similar appearance of left pelvic sidewall tumor with mild erosive changes involving the left sacrum. 7. These results will be called to the ordering clinician or representative by the Radiologist Assistant, and communication documented in the PACS or zVision Dashboard. Electronically Signed   By: Kerby Moors M.D.   On: 12/24/2017 03:02     Alma Friendly, MD  [facility]   If 7PM-7AM, please contact night-coverage [email or web address] 12/26/2017, 5:26 PM   LOS: 1 day

## 2017-12-26 NOTE — Consult Note (Signed)
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 Cindy Faulkner tells me this is all still unbelievable, it all started in January with severe back pain and since that time her fatigue has been severe, and despite several treatment regimens and trials she has not responded and has widespread cancer progression including in her liver, peritoneum, worsening ascites, poor appetite with low albumin of 1.8. Her daughter and mother are at the bedside. Cindy Faulkner appears withdrawn and resigned - she tells me that she is so weak and fatigued that she can barely move or speak. The nausea has been severe, dry heaving, persistent for the past few days. No obvious obstruction or jaundice. She has has bowel movements despite poor PO intake. Dehydration on labs.  Clinical Assessment and Goals of Care: Unfortunately Cindy Faulkner is declining rapidly, I am concerned her condition at this point may be in the range of days-weeks at best. It will be difficult make a big difference on her quality of life-she is just too sick and the cancer has progressed taking all of her energy and making her nearly bed-bound. She appears visibly uncomfortable - reports almost all nausea, pain only with moving around in the bed, has had severe "bloating" making it difficult to breathe.  I discussed her goals of care including code status. She and her daughter have agreed to DNR.   1. Nausea: Will be aggressive at this point.   Scheduled Reglan 10mg  TID  Zyprexa 2.5mg  QHS FOR NAUSEA  Decadron 10mg  IV  load, daily to lowest effective dose  Therapeutic/Comfort Care Paracentesis  Stopped multivitamins and meds that can cause nausea, including Marinol   Will give her albumin IV q6 hours until after paracentesis, intravascular dehydration may be also causing symptoms.  Repeating Mag X2 and KCl. Need to keep K and Mag at normal levels and monitor for QT changes.  2. Pain: Mild mostly with movement, but I suspect she is being stoic looking at the images of her sacrum.  Fentanyl 12.5mg  q2 prn pain  3. Fatigue: Hopefully the steroids will help.   4. Bloating: Will request IR do paracentesis for comfort  Hopefully we can get her feeling at least somewhat better-then I think she will be able to talk with me in more detail about her wishes next steps and plans. Cindy Faulkner knows this is serious and that she is dying, she is grieving with her daughter. Will have ongoing conversation.      Primary Diagnoses: Present on Admission: . Acute metabolic encephalopathy . AF (paroxysmal atrial fibrillation) ([place]) . Essential hypertension . MDD (major depressive disorder), recurrent severe, without psychosis ([place]) . Metastasis to liver ([place]) .  Metastatic squamous cell carcinoma involving liver & sacrum with unknown primary site  . Severe protein-calorie malnutrition ([place]) . Stroke (cerebrum) ([place]) . Sepsis ([place]) . Anemia due to antineoplastic chemotherapy . Cerebral thrombosis with cerebral infarction . Hyponatremia . Hypokalemia . Thrombocytopenia ([place])   I have reviewed the medical record, interviewed the patient and family, and examined the patient. The following aspects are pertinent.  Past Medical History:  Diagnosis Date  . Atrial fibrillation ([place])    a. echo 4/07: EF 60%  . Atrial tachycardia ([place])    a. s/p EPS 4/09: no inducible SVT - med Tx continued  . Cancer ([place])   . Complication of anesthesia    age 89yo, woke during procedure with GA, paralysed   . Endometrial polyp   .  Hyperlipidemia   . Hypertension   . PONV (postoperative nausea and vomiting)   . Rectocele    WITHOUT MENTION OF UTERINE PROLAPSE  . Stroke ([place])   . SVD (spontaneous vaginal delivery)    x 1   Social History   Socioeconomic History  . Marital status: Married    Spouse name: Kasandra Knudsen  . Number of children: 1  . Years of education: Not on file  . Highest education level: Not on file  Occupational History  . Occupation: nurse  Social Needs  . Financial resource strain: Not on file  . Food insecurity:    Worry: Not on file    Inability: Not on file  . Transportation needs:    Medical: Not on file    Non-medical: Not on file  Tobacco Use  . Smoking status: Former Smoker    Packs/day: 0.50    Years: 10.00    Pack years: 5.00    Types: Cigarettes    Last attempt to quit: 02/22/1998    Years since quitting: 19.8  . Smokeless tobacco: Never Used  Substance and Sexual Activity  . Alcohol use: No  . Drug use: No  . Sexual activity: Not on file  Lifestyle  . Physical activity:    Days per week: Not on file    Minutes per session: Not on file  . Stress: Not on file  Relationships  . Social connections:    Talks on phone: Not on file    Gets together: Not on file    Attends religious service: Not on file    Active member of club or organization: Not on file    Attends meetings of clubs or organizations: Not on file    Relationship status: Not on file  Other Topics Concern  . Not on file  Social History Narrative  . Not on file   Family History  Problem Relation Age of Onset  . Hypertension Father   . Diabetes Father   . Stroke Father   . Hypertension Mother   . Stroke Mother   . Cancer Mother        cervical ca  . Cancer Sister 72       uterine ca  . Cancer Maternal Grandmother        lung ca   Scheduled Meds: . cholecalciferol  2,000 Units Oral Daily  . citalopram  20 mg Oral Daily  . dexamethasone  10 mg Intravenous Once  . feeding supplement (ENSURE  ENLIVE)  237 mL Oral BID BM  . flecainide  50 mg Oral Daily  . insulin aspart  0-9 Units Subcutaneous TID WC  . metoCLOPramide (REGLAN) injection  10 mg Intravenous TID  .  OLANZapine zydis  2.5 mg Oral QHS  . ondansetron (ZOFRAN) IV  4 mg Intravenous Q6H  . pantoprazole  40 mg Oral Daily  . protein supplement shake  11 oz Oral BID BM  . vancomycin variable dose per unstable renal function (pharmacist dosing)   Does not apply See admin instructions   Continuous Infusions: . 0.9 % NaCl with KCl 20 mEq / L 100 mL/hr at 12/26/17 0748  . albumin human    . magnesium sulfate 1 - 4 g bolus IVPB    . piperacillin-tazobactam (ZOSYN)  IV 3.375 g (12/26/17 0935)   PRN Meds:.hydrALAZINE, lidocaine-prilocaine, LORazepam, promethazine, sodium chloride flush, zolpidem Medications Prior to Admission:  Prior to Admission medications   Medication Sig Start Date End Date Taking? Authorizing Provider  cholecalciferol (VITAMIN D) 1000 units tablet Take 2,000 Units by mouth daily.   Yes [provider]  citalopram (CELEXA) 40 MG tablet Take 20 mg by mouth daily. 09/20/17  Yes [provider]  dronabinol (MARINOL) 2.5 MG capsule Take 1 capsule (2.5 mg total) by mouth 2 (two) times daily before a meal. Patient taking differently: Take 2.5 mg by mouth 2 (two) times daily as needed. Nausea 12/12/17  Yes Gorsuch, Ni, MD  flecainide (TAMBOCOR) 50 MG tablet Take 1 tablet (50 mg total) by mouth daily. Please make overdue appt with Dr. Lovena Le before anymore refills. 1st attempt Patient taking differently: Take 50 mg by mouth daily.  11/29/17  Yes Evans Lance, MD  lidocaine-prilocaine (EMLA) cream APPLY 1 APPLICATION TO AFFECTED AREA(S) TOPICALLY AS NEEDED Patient taking differently: Apply 1 application topically daily as needed (port).  11/14/17  Yes Gorsuch, Ni, MD  magnesium oxide (MAG-OX) 400 (241.3 Mg) MG tablet Take 1 tablet (400 mg total) by mouth 2 (two) times daily. 11/18/17  Yes Gorsuch, Ni,  MD  metFORMIN (GLUCOPHAGE) 500 MG tablet Take 1 tablet (500 mg total) by mouth 2 (two) times daily. Patient taking differently: Take 500 mg by mouth 2 (two) times daily as needed (low sugar).  10/31/17  Yes Gorsuch, Ni, MD  ondansetron (ZOFRAN) 8 MG tablet Take 1 tablet (8 mg total) by mouth every 8 (eight) hours as needed for nausea. Patient taking differently: Take 8 mg by mouth every 8 (eight) hours.  04/26/17  Yes Gorsuch, Ni, MD  pantoprazole (PROTONIX) 40 MG tablet Take 1 tablet (40 mg total) by mouth daily. 12/19/17  Yes Levin Erp, PA  protein supplement shake (PREMIER PROTEIN) LIQD Take 325 mLs (11 oz total) by mouth 2 (two) times daily between meals. 11/18/17  Yes Gorsuch, Ernst Spell, MD  rivaroxaban (XARELTO) 20 MG TABS tablet Take 1 tablet (20 mg total) by mouth daily. 12/06/15  Yes Dhungel, Nishant, MD  polyethylene glycol (MIRALAX / GLYCOLAX) packet Take 17 g by mouth daily. Patient not taking: Reported on 12/25/2017 04/01/17   Shelly Coss, MD   No Known Allergies Review of Systems  Physical Exam  Vital Signs: BP (!) 135/95 Comment: calvin/david notified  Pulse 87   Temp 98 F (36.7 C)   Resp 20   Ht 5\' 7"  (1.702 m)   Wt 99.8 kg   LMP 02/12/2012   SpO2 97%   BMI 34.46 kg/m  Pain Scale: 0-10   Pain Score: 0-No pain   SpO2: SpO2: 97 % O2 Device:SpO2: 97 % O2 Flow Rate: .   IO: Intake/output summary:   Intake/Output Summary (Last 24 hours) at 12/26/2017 1521 Last data filed at 12/26/2017 1500 Gross per 24  hour  Intake 2917.62 ml  Output -  Net 2917.62 ml    LBM: Last BM Date: 12/25/17 Baseline Weight: Weight: 99.8 kg Most recent weight: Weight: 99.8 kg     Palliative Assessment/Data:      Time Total: 70 min Greater than 50%  of this time was spent counseling and coordinating care related to the above assessment and plan.  Signed by: Lane Hacker, DO   Please contact Palliative Medicine Team phone at [phone] for questions and concerns.  For  individual provider: See Shea Evans

## 2017-12-27 LAB — RENAL FUNCTION PANEL
Albumin: 2.3 g/dL — ABNORMAL LOW (ref 3.5–5.0)
Anion gap: 10 (ref 5–15)
BUN: 12 mg/dL (ref 6–20)
CHLORIDE: 100 mmol/L (ref 98–111)
CO2: 22 mmol/L (ref 22–32)
CREATININE: 1.08 mg/dL — AB (ref 0.44–1.00)
Calcium: 8.1 mg/dL — ABNORMAL LOW (ref 8.9–10.3)
GFR calc Af Amer: >60 mL/min (ref >60)
GFR calc non Af Amer: 57 mL/min — ABNORMAL LOW (ref >60)
Glucose, Bld: 197 mg/dL — ABNORMAL HIGH (ref 70–99)
Phosphorus: 3.1 mg/dL (ref 2.5–4.6)
Potassium: 4 mmol/L (ref 3.5–5.1)
Sodium: 132 mmol/L — ABNORMAL LOW (ref 135–145)

## 2017-12-27 LAB — GLUCOSE, CAPILLARY
GLUCOSE-CAPILLARY: 167 mg/dL — AB (ref 70–99)
GLUCOSE-CAPILLARY: 219 mg/dL — AB (ref 70–99)
GLUCOSE-CAPILLARY: 158 mg/dL — AB (ref 70–99)
GLUCOSE-CAPILLARY: 207 mg/dL — AB (ref 70–99)

## 2017-12-27 LAB — DIRECT ANTIGLOBULIN TEST (NOT AT ARMC)
DAT, COMPLEMENT: NEGATIVE
DAT, IGG: NEGATIVE

## 2017-12-27 LAB — CBC WITH DIFFERENTIAL/PLATELET
Abs Immature Granulocytes: 0.13 10*3/uL — ABNORMAL HIGH (ref 0.00–0.07)
Basophils Absolute: 0 10*3/uL (ref 0.0–0.1)
Basophils Relative: 0 %
EOS ABS: 0 10*3/uL (ref 0.0–0.5)
EOS PCT: 0 %
HCT: 28.9 % — ABNORMAL LOW (ref 36.0–46.0)
Hemoglobin: 8.6 g/dL — ABNORMAL LOW (ref 12.0–15.0)
Immature Granulocytes: 1 %
Lymphocytes Relative: 10 %
Lymphs Abs: 1.1 10*3/uL (ref 0.7–4.0)
MCH: 27.1 pg (ref 26.0–34.0)
MCHC: 29.8 g/dL — AB (ref 30.0–36.0)
MCV: 91.2 fL (ref 80.0–100.0)
MONO ABS: 0.2 10*3/uL (ref 0.1–1.0)
Monocytes Relative: 2 %
NEUTROS ABS: 8.7 10*3/uL — AB (ref 1.7–7.7)
Neutrophils Relative %: 87 %
PLATELETS: 19 10*3/uL — AB (ref 150–400)
RBC: 3.17 MIL/uL — AB (ref 3.87–5.11)
RDW: 18.9 % — AB (ref 11.5–15.5)
WBC: 10.1 10*3/uL (ref 4.0–10.5)
nRBC: 0.2 % (ref 0.0–0.2)

## 2017-12-27 LAB — HOMOCYSTEINE: Homocysteine: 14.7 umol/L (ref 0.0–15.0)

## 2017-12-27 LAB — PROTIME-INR
INR: 2.89
Prothrombin Time: 29.8 seconds — ABNORMAL HIGH (ref 11.4–15.2)

## 2017-12-27 LAB — MAGNESIUM: MAGNESIUM: 1 mg/dL — AB (ref 1.7–2.4)

## 2017-12-27 LAB — VANCOMYCIN, RANDOM: Vancomycin Rm: 18

## 2017-12-27 LAB — HAPTOGLOBIN: Haptoglobin: <10 mg/dL — ABNORMAL LOW (ref 34–200)

## 2017-12-27 MED ORDER — MAGNESIUM SULFATE 2 GM/50ML IV SOLN
2.0000 g | Freq: Once | INTRAVENOUS | Status: AC
  Administered 2017-12-27: 2 g via INTRAVENOUS
  Filled 2017-12-27: qty 50

## 2017-12-27 MED ORDER — VANCOMYCIN HCL IN DEXTROSE 750-5 MG/150ML-% IV SOLN
750.0000 mg | Freq: Two times a day (BID) | INTRAVENOUS | Status: DC
  Filled 2017-12-27: qty 150

## 2017-12-27 MED ORDER — MAGNESIUM SULFATE 4 GM/100ML IV SOLN
4.0000 g | Freq: Once | INTRAVENOUS | Status: AC
  Administered 2017-12-27: 4 g via INTRAVENOUS
  Filled 2017-12-27: qty 100

## 2017-12-27 MED ORDER — VANCOMYCIN HCL IN DEXTROSE 750-5 MG/150ML-% IV SOLN
750.0000 mg | Freq: Once | INTRAVENOUS | Status: AC
  Administered 2017-12-27: 750 mg via INTRAVENOUS
  Filled 2017-12-27: qty 150

## 2017-12-27 MED ORDER — SODIUM CHLORIDE 0.9% IV SOLUTION: Freq: Once | INTRAVENOUS | Status: DC

## 2017-12-27 MED ORDER — DEXAMETHASONE SODIUM PHOSPHATE 10 MG/ML IJ SOLN
4.0000 mg | Freq: Two times a day (BID) | INTRAMUSCULAR | Status: DC
  Administered 2017-12-27 – 2017-12-28 (×2): 4 mg via INTRAVENOUS
  Filled 2017-12-27 (×4): qty 0.4

## 2017-12-27 NOTE — Progress Notes (Signed)
PROGRESS NOTE  Cindy Faulkner:[id number] DOB: October 31, 1965 DOA: 12/24/2017 PCP: Lennie Odor, PA-C  Brief History:  52 y.o. female with medical history significant of metastatic squamous cell carcinoma involving liver & sacrum with unknown primary site (stoped chemo in May), hypertension, hyperlipidemia, stroke, GERD, depression, atrial fibrillation on Xarelto, who presents with altered mental status.  According to the patient's husband, the patient has had a functional decline over the past 2 weeks, now having difficulty getting out of bed without assistance.  In the past 2 to 3 days prior to admission, the patient has had worsening oral intake and increasing lethargy.  The patient has had on and off low-grade temperatures for the past week with a T-max of 101.0 F approximately 3 days prior to this admission.  There has been no headaches, neck pain, chest pain, coughing, hemoptysis, diarrhea, dysuria, hematuria, hematochezia, melena.  Upon presentation, the patient was noted to have sodium 128 and WBC 24.1 and hemoglobin 8.0.  There is concern for infectious source.  Patient was started on empiric vancomycin and Zosyn. Pt admitted for further management.  Assessment/Plan: Acute metabolic encephalopathy likely 2/2 worsening metastatic CA with possible sepsis of unknown origin -Multifactorial including infectious etiology, hyponatremia, dehydration, pancytopenia, cancer of unknown origin ?Cervical Ca as primary -Currently afebrile, with improved leukocytosis -Urinalysis negative for pyuria, UC with no growth -Stool culture for C.diff negative -BC X 2, NGTD -Chest x-ray personally reviewed--no consolidations -Personally reviewed EKG--sinus rhythm, no ST-T wave changes -CT head negative for any mets -Continue empiric IV Zosyn, d/c IV Vancomycin as no MRSA growing -Palliative consulted due to very poor prognosis, made DNR  Coagulopathy/Thrombocytopenia -No signs of active  bleeding -Likely due to sepsis resulting in ??DIC -Presenting INR 3.66, PTT 51, fibrinogen 226, DAT negative -D-dimer 14.95 -S/P transfused 2 units PRBC, s/p 1U of platelet 12/27/17 -Hematology/oncology on board, await further recs  Intractable nausea/vomiting/abdominal distension/fullness -12/23/2017 CT abdomen and pelvis--interval progression of malignancy with progression of liver metastasis; new abdominal ascites with loculated fluid over the liver and peritoneal nodule in the right upper quadrant; new right upper lobe and right lower lobe lung nodule, multiple splenic and renal infarcts Scheduled reglan, zyprexa for nausea, decadron as per palliative Palliative recommending therapeutic/comfort paracentesis. IR consulted, unable to do today due to coagulopathy. IR will re-evaluate (??may try paracentesis after platelet transfusion) Palliative started pt on IV albumin until after paracentesis Pain management with fentanyl  Cancer of unknown origin likely ?cervical CA -Follows Dr. Alvy Bimler -mets to liver and sacrum -last Keytruda 2-3 weeks prior to admission -Holding off all chemo for now  Paroxysmal atrial fibrillation -Holding rivaroxaban secondary to thrombocytopenia -Continue flecainide -Presently in sinus rhythm  Hypokalemia/Hypomagnesemia -replete prn  Hyponatremia -due to poor oral intake and dehydration -continue IVF  Anemia due to antineoplastic chemotherapy This is likely anemia of chronic disease -Serum R15 elevated, folic acid level low -Iron 25, sats 23, ferritin 4,414  MDD (major depressive disorder), recurrent severe, without psychosis ([place]): -Continue Celexa  Severe protein-calorie malnutrition ([place]): -Protein supplement    Disposition Plan: TBD  Family Communication: Husband updated at bedside 11/5  Consultants:   Med Onc, palliative  Code Status:  DNR  DVT Prophylaxis:  SCDs   Procedures: None  Antibiotics: vanco 11/2>>>11/5 Zosyn  11/2>>>    Subjective: Pt noted to be significantly improved today, less lethargic, more awake, asking for food of which she tolerated, had a goodnight sleep. Pt still c/o abdominal  discomfort due to distension. Denies any headache, chest pain, N/V, fever/chills.  Objective: Vitals:   12/27/17 1421 12/27/17 1506 12/27/17 1522 12/27/17 1648  BP: 120/76 121/82 109/86 115/84  Pulse: 65 77 79 75  Resp:   20 20  Temp: 98 F (36.7 C)  (!) 97.5 F (36.4 C) 97.6 F (36.4 C)  TempSrc: Oral Oral Oral Oral  SpO2: 97% 95% 95% 95%  Weight:      Height:        Intake/Output Summary (Last 24 hours) at 12/27/2017 1718 Last data filed at 12/27/2017 1648 Gross per 24 hour  Intake 1745.88 ml  Output -  Net 1745.88 ml   Weight change:  Exam:  General: NAD, chronically ill appearing  Cardiovascular: S1, S2 present  Respiratory: CTAB  Abdomen: Soft, nontender, distended, bowel sounds present  Musculoskeletal: No bilateral pedal edema noted  Skin: Normal  Psychiatry: No new focal neurologic deficits noted     Data Reviewed: I have personally reviewed following labs and imaging studies Basic Metabolic Panel: Recent Labs  Lab 12/24/17 2141 12/25/17 0245 12/25/17 0619 12/26/17 0636 12/27/17 0509  NA 128*  --  130* 130* 132*  K 3.3*  --  3.3* 3.7 4.0  CL 93*  --  98 100 100  CO2 24  --  24 23 22   GLUCOSE 128*  --  145* 169* 197*  BUN 9  --  9 11 12   CREATININE 0.58  --  0.50 0.94 1.08*  CALCIUM 8.2*  --  7.7* 7.8* 8.1*  MG  --  1.3*  --  1.6* 1.0*  PHOS  --   --   --  2.3* 3.1   Liver Function Tests: Recent Labs  Lab 12/24/17 2141 12/25/17 0619 12/26/17 0636 12/27/17 0509  AST 47* 39 38  --   ALT 24 20 23   --   ALKPHOS 168* 138* 154*  --   BILITOT 1.4* 1.4* 1.6*  --   PROT 5.8* 5.0* 5.1*  --   ALBUMIN 2.0* 1.7* 1.8* 2.3*   Recent Labs  Lab 12/24/17 2141  LIPASE 18   Recent Labs  Lab 12/25/17 1215  AMMONIA 19   Coagulation Profile: Recent Labs  Lab  12/25/17 0047 12/25/17 1215 12/27/17 0509  INR 3.66 3.10 2.89   CBC: Recent Labs  Lab 12/24/17 2107 12/25/17 0047 12/25/17 0619 12/25/17 1215 12/26/17 0636 12/27/17 0509  WBC 24.1*  --  16.2*  --  20.4* 10.1  NEUTROABS  --   --  14.1*  --  17.4* 8.7*  HGB 8.0*  --  6.9*  --  8.9* 8.6*  HCT 27.2*  --  23.3*  --  28.8* 28.9*  MCV 89.8  --  90.0  --  90.0 91.2  PLT PLATELET CLUMPS NOTED ON SMEAR, COUNT APPEARS DECREASED 27* 30* 40* 28* 19*   Cardiac Enzymes: No results for input(s): CKTOTAL, CKMB, CKMBINDEX, TROPONINI in the last 168 hours. BNP: Invalid input(s): POCBNP CBG: Recent Labs  Lab 12/26/17 1553 12/26/17 2300 12/27/17 0745 12/27/17 1134 12/27/17 1637  GLUCAP 148* 202* 167* 219* 158*   HbA1C: No results for input(s): HGBA1C in the last 72 hours. Urine analysis:    Component Value Date/Time   COLORURINE BROWN (A) 12/25/2017 0630   APPEARANCEUR TURBID (A) 12/25/2017 0630   LABSPEC 1.020 12/25/2017 0630   PHURINE 6.0 12/25/2017 0630   GLUCOSEU 100 (A) 12/25/2017 0630   HGBUR NEGATIVE 12/25/2017 0630   BILIRUBINUR SMALL (A) 12/25/2017 0630  KETONESUR NEGATIVE 12/25/2017 0630   PROTEINUR NEGATIVE 12/25/2017 0630   UROBILINOGEN 0.2 11/27/2010 1122   NITRITE NEGATIVE 12/25/2017 0630   LEUKOCYTESUR NEGATIVE 12/25/2017 0630   Sepsis Labs: @LABRCNTIP (procalcitonin:4,lacticidven:4) ) Recent Results (from the past 240 hour(s))  Culture, blood (Routine X 2) w Reflex to ID Panel     Status: None (Preliminary result)   Collection Time: 12/25/17  2:19 AM  Result Value Ref Range Status   Specimen Description   Final    BLOOD RIGHT PORTA CATH Performed at [facility] [address] [place], [place] [address]    Special Requests   Final    BOTTLES DRAWN AEROBIC AND ANAEROBIC Blood Culture adequate volume Performed at [facility] [address], [place], [place] [address]    Culture   Final    NO GROWTH 2  DAYS Performed at [facility] [address], [place], [place] [address]    Report Status PENDING  Incomplete  Culture, blood (Routine X 2) w Reflex to ID Panel     Status: None (Preliminary result)   Collection Time: 12/25/17  2:19 AM  Result Value Ref Range Status   Specimen Description   Final    BLOOD LEFT FOREARM Performed at [facility] [address] [place], [place] [address]    Special Requests   Final    BOTTLES DRAWN AEROBIC AND ANAEROBIC Blood Culture adequate volume Performed at [facility] [address], [place], [place] [address]    Culture   Final    NO GROWTH 2 DAYS Performed at [facility] [address] [place], [place] [address]    Report Status PENDING  Incomplete  Urine Culture     Status: None   Collection Time: 12/25/17  6:30 AM  Result Value Ref Range Status   Specimen Description   Final    URINE, CLEAN CATCH Performed at [facility], [place] [address], [place], [place] [address]    Special Requests   Final    NONE Performed at [facility], [place] [address], [place], [place] [address]    Culture   Final    NO GROWTH Performed at [facility], [place] [address] [place], [place] [address]    Report Status 12/26/2017 FINAL  Final  C difficile quick scan w PCR reflex     Status: None   Collection Time: 12/25/17  3:47 PM  Result Value Ref Range Status   C Diff antigen NEGATIVE NEGATIVE Final   C Diff toxin NEGATIVE NEGATIVE Final   C Diff interpretation No C. difficile detected.  Final    Comment: Performed at [facility], [place] [address], [place], [place] [address]     Scheduled Meds: . sodium chloride   Intravenous Once  . citalopram  20 mg Oral Daily  . dexamethasone  4 mg Intravenous Q12H  . flecainide  50 mg Oral Daily  . insulin aspart  0-9 Units Subcutaneous TID WC  . metoCLOPramide (REGLAN) injection  10 mg Intravenous  TID  . OLANZapine zydis  2.5 mg Oral QHS  . ondansetron (ZOFRAN) IV  4 mg Intravenous TID  . pantoprazole  40 mg Oral Daily  . protein supplement shake  11 oz Oral BID BM   Continuous Infusions: . 0.9 % NaCl with KCl 20 mEq / L 100 mL/hr at 12/27/17 1500  . albumin human 12.5 g (12/27/17 1206)  .  magnesium sulfate 1 - 4 g bolus IVPB 2 g (12/27/17 1639)  . piperacillin-tazobactam (ZOSYN)  IV Stopped (12/27/17 1452)    Procedures/Studies: Dg Chest 2 View  Result Date: 12/24/2017 CLINICAL DATA:  Dizziness and lethargy EXAM: CHEST - 2 VIEW COMPARISON:  November 15, 2017 FINDINGS: Stable right Port-A-Cath. Elevation the right hemidiaphragm has worsened in the interval. The cardiomediastinal silhouette is normal. No other acute abnormalities. IMPRESSION: Elevation of the right hemidiaphragm, increased in the interval. No other acute abnormalities. Electronically Signed   By: Dorise Bullion III M.D   On: 12/24/2017 22:25   Ct Head Wo Contrast  Result Date: 12/25/2017 CLINICAL DATA:  Metastatic disease of unknown primary. Weakness and dizziness since 1600 hours. EXAM: CT HEAD WITHOUT CONTRAST TECHNIQUE: Contiguous axial images were obtained from the base of the skull through the vertex without intravenous contrast. COMPARISON:  MRI brain 10/24/2017 FINDINGS: Brain: No evidence of acute infarction, hemorrhage, hydrocephalus, extra-axial collection or mass lesion/mass effect. Vascular: No hyperdense vessel or unexpected calcification. Skull: Normal. Negative for fracture or focal lesion. Sinuses/Orbits: Retention cyst versus polyp noted within the right maxillary sinus as before. Other: None IMPRESSION: 1. No acute intracranial abnormalities. 2. No evidence for brain metastases. Electronically Signed   By: Kerby Moors M.D.   On: 12/25/2017 00:06   Ct Chest W Contrast  Result Date: 12/24/2017 CLINICAL DATA:  Metastatic cervical cancer.  Restaging. EXAM: CT CHEST, ABDOMEN, AND PELVIS WITH CONTRAST  TECHNIQUE: Multidetector CT imaging of the chest, abdomen and pelvis was performed following the standard protocol during bolus administration of intravenous contrast. CONTRAST:  184mL OMNIPAQUE IOHEXOL 300 MG/ML  SOLN COMPARISON:  None. CT AP 10/20/2017 and CT chest 03/03/2017 FINDINGS: CT CHEST FINDINGS Cardiovascular: There is a right chest wall port a catheter with tip at the cavoatrial junction. Normal heart size. No pericardial effusion. Mediastinum/Nodes: No enlarged mediastinal, hilar, or axillary lymph nodes. Small cardio phrenic angle lymph nodes appear increased from previous exam. The largest measures 1 cm, image 40/2. Previously 4 mm. Thyroid gland, trachea, and esophagus demonstrate no significant findings. Lungs/Pleura: There is a new small right pleural effusion. Asymmetric elevation of the right hemidiaphragm is again noted. There is overlying passive atelectasis and volume loss from the right lower lobe. Right apical lung nodule measures 5 mm and is new from previous exam, image 21/4. Tiny nodule within the medial right lower lobe is also new measuring 3 mm, image 75/4. Musculoskeletal: No chest wall mass or suspicious bone lesions identified. CT ABDOMEN PELVIS FINDINGS Hepatobiliary: Extensive liver metastases involving both lobes of the liver are identified the confluent mass within the right lobe of liver measures 14.7 x 6.6 by 18.7 cm, image 54/2. On the previous exam this measured 11.8 x 5.2 by 11.8 cm. Posterior segment 8 lesion measures 4.9 by 3.2 cm, image 47/2. Previously 3.6 x 2.5 cm. Multiple new lesions are identified. Lesion within segment 6 appears new measuring 2.7 x 2.4 cm, image 72/2. Lesion within the subcapsular left lobe is new measuring 3.8 x 2.8 cm, image 54/2. Mild pericholecystic fluid noted. No gallstones. No biliary dilatation. Pancreas: Unremarkable. No pancreatic ductal dilatation or surrounding inflammatory changes. Spleen: There are multiple new wedge-shaped areas of  peripheral low attenuation within the spleen compatible with splenic infarcts. Adrenals/Urinary Tract: The adrenal glands appear normal. New large area of low attenuation within the inferior pole of left kidney is new measuring 4.2 by 3.1 by 4.5 cm. Wedge-shaped area of low attenuation and cortical volume loss within  the posterior right kidney is new compatible with infarct, image 68/2. No hydronephrosis. Urinary bladder appears normal. Stomach/Bowel: Stomach is normal. No abnormal small bowel dilatation identified. No pathologic dilatation of the colon. Vascular/Lymphatic: Aortic atherosclerosis. Small retroperitoneal lymph nodes are identified no adenopathy within the abdomen. No pelvic or inguinal adenopathy. Reproductive: Anteverted uterus.  No discrete mass identified. Other: Enhancing lesion within the left pelvic sidewall is again noted measuring 4.9 by 1.9 cm, image 108/2. Interval development of moderate volume ascites. New soft tissue peritoneal nodule in the right upper quadrant of the abdomen measures 1.6 cm, image 77/2. New loculated ascites overlies the right lobe of liver, image 47/2. Musculoskeletal: Mild erosive changes involving the left sacrum secondary to the left pelvic sidewall mass identified, image 124/5. IMPRESSION: 1. Interval progression of disease. 2. Significant progression of liver metastasis. 3. New abdominal ascites with loculated fluid overlying the liver and at least 1 peritoneal nodule in the right upper quadrant of the abdomen compatible with peritoneal carcinomatosis. 4. New pulmonary nodule in the right upper lobe and right lower lobe compatible with metastatic disease. 5. Multiple splenic and renal infarcts.  New from previous exam. 6. Similar appearance of left pelvic sidewall tumor with mild erosive changes involving the left sacrum. 7. These results will be called to the ordering clinician or representative by the Radiologist Assistant, and communication documented in the  PACS or zVision Dashboard. Electronically Signed   By: Kerby Moors M.D.   On: 12/24/2017 03:02   Ct Abdomen Pelvis W Contrast  Result Date: 12/24/2017 CLINICAL DATA:  Metastatic cervical cancer.  Restaging. EXAM: CT CHEST, ABDOMEN, AND PELVIS WITH CONTRAST TECHNIQUE: Multidetector CT imaging of the chest, abdomen and pelvis was performed following the standard protocol during bolus administration of intravenous contrast. CONTRAST:  179mL OMNIPAQUE IOHEXOL 300 MG/ML  SOLN COMPARISON:  None. CT AP 10/20/2017 and CT chest 03/03/2017 FINDINGS: CT CHEST FINDINGS Cardiovascular: There is a right chest wall port a catheter with tip at the cavoatrial junction. Normal heart size. No pericardial effusion. Mediastinum/Nodes: No enlarged mediastinal, hilar, or axillary lymph nodes. Small cardio phrenic angle lymph nodes appear increased from previous exam. The largest measures 1 cm, image 40/2. Previously 4 mm. Thyroid gland, trachea, and esophagus demonstrate no significant findings. Lungs/Pleura: There is a new small right pleural effusion. Asymmetric elevation of the right hemidiaphragm is again noted. There is overlying passive atelectasis and volume loss from the right lower lobe. Right apical lung nodule measures 5 mm and is new from previous exam, image 21/4. Tiny nodule within the medial right lower lobe is also new measuring 3 mm, image 75/4. Musculoskeletal: No chest wall mass or suspicious bone lesions identified. CT ABDOMEN PELVIS FINDINGS Hepatobiliary: Extensive liver metastases involving both lobes of the liver are identified the confluent mass within the right lobe of liver measures 14.7 x 6.6 by 18.7 cm, image 54/2. On the previous exam this measured 11.8 x 5.2 by 11.8 cm. Posterior segment 8 lesion measures 4.9 by 3.2 cm, image 47/2. Previously 3.6 x 2.5 cm. Multiple new lesions are identified. Lesion within segment 6 appears new measuring 2.7 x 2.4 cm, image 72/2. Lesion within the subcapsular left  lobe is new measuring 3.8 x 2.8 cm, image 54/2. Mild pericholecystic fluid noted. No gallstones. No biliary dilatation. Pancreas: Unremarkable. No pancreatic ductal dilatation or surrounding inflammatory changes. Spleen: There are multiple new wedge-shaped areas of peripheral low attenuation within the spleen compatible with splenic infarcts. Adrenals/Urinary Tract: The adrenal glands appear normal.  New large area of low attenuation within the inferior pole of left kidney is new measuring 4.2 by 3.1 by 4.5 cm. Wedge-shaped area of low attenuation and cortical volume loss within the posterior right kidney is new compatible with infarct, image 68/2. No hydronephrosis. Urinary bladder appears normal. Stomach/Bowel: Stomach is normal. No abnormal small bowel dilatation identified. No pathologic dilatation of the colon. Vascular/Lymphatic: Aortic atherosclerosis. Small retroperitoneal lymph nodes are identified no adenopathy within the abdomen. No pelvic or inguinal adenopathy. Reproductive: Anteverted uterus.  No discrete mass identified. Other: Enhancing lesion within the left pelvic sidewall is again noted measuring 4.9 by 1.9 cm, image 108/2. Interval development of moderate volume ascites. New soft tissue peritoneal nodule in the right upper quadrant of the abdomen measures 1.6 cm, image 77/2. New loculated ascites overlies the right lobe of liver, image 47/2. Musculoskeletal: Mild erosive changes involving the left sacrum secondary to the left pelvic sidewall mass identified, image 124/5. IMPRESSION: 1. Interval progression of disease. 2. Significant progression of liver metastasis. 3. New abdominal ascites with loculated fluid overlying the liver and at least 1 peritoneal nodule in the right upper quadrant of the abdomen compatible with peritoneal carcinomatosis. 4. New pulmonary nodule in the right upper lobe and right lower lobe compatible with metastatic disease. 5. Multiple splenic and renal infarcts.  New  from previous exam. 6. Similar appearance of left pelvic sidewall tumor with mild erosive changes involving the left sacrum. 7. These results will be called to the ordering clinician or representative by the Radiologist Assistant, and communication documented in the PACS or zVision Dashboard. Electronically Signed   By: Kerby Moors M.D.   On: 12/24/2017 03:02    Alma Friendly, MD  [facility]   If 7PM-7AM, please contact night-coverage [email or web address] 12/27/2017, 5:18 PM   LOS: 2 days

## 2017-12-27 NOTE — Progress Notes (Signed)
Palliative Care Follow-up  Cindy Faulkner looks much better today-dramatic response to palliative regimen for nausea -likely mostly from the decadron and she slept really well last night after receiving the Zyprexa. She is asking for food today, has been OOB, smiling and interacting with her family. Lots of visitors at the bedside this afternoon.  We discussed next steps and plan: 1. For today will leave her regimen the same for another 24 hours 2. Continue to replete her electrolyte deficiencies (Mg lower-unusual-repeat lab) 3. Continue albumin for now 4. Awaiting US paracentesis, she is getting platelets- hopefully this will help.   Encouraged her to take this one day at a time- today is a much better day for her- will begin discussion about home soon, but she would greatly benefit from hospice care,   Cindy Faulkner, [place] [phone]  Time: 35 min Greater than 50%  of this time was spent counseling and coordinating care related to the above assessment and plan.

## 2017-12-27 NOTE — Progress Notes (Signed)
Inpatient Diabetes Program Recommendations  AACE/ADA: New Consensus Statement on Inpatient Glycemic Control (2015)  Target Ranges:  Prepandial:   less than 140 mg/dL      Peak postprandial:   less than 180 mg/dL (1-2 hours)      Critically ill patients:  140 - 180 mg/dL   Results for Cindy Faulkner, Cindy Faulkner (MRN [id number]) as of 12/27/2017 12:49  Ref. Range 12/26/2017 11:43 12/26/2017 15:53 12/26/2017 23:00 12/27/2017 07:45 12/27/2017 11:34  Glucose-Capillary Latest Ref Range: 70 - 99 mg/dL 140 (H) 148 (H) 202 (H) 167 (H) 219 (H)   Review of Glycemic Control  Diabetes history: DM 2 Outpatient Diabetes medications: Metformin 500 mg BID Current orders for Inpatient glycemic control: Novolog 0-9 units tid  Inpatient Diabetes Program Recommendations:    Patient received Decadron 10 mg x1 dose and is ordered Decadron 4 mg Q12 hours. Patient ordered a regular diet in addition to supplements, would not limit at this time. Glucose trends increased. If in the plan of care, consider increasing Novolog Correction to Novolog 0-15 units tid.  Thanks,  Tama Headings RN, MSN, BC-ADM Inpatient Diabetes Coordinator Team Pager [phone] (8a-5p)

## 2017-12-27 NOTE — Progress Notes (Signed)
Pharmacy Antibiotic Note  Cindy Faulkner is a 52 y.o. female admitted on 12/24/2017 with sepsis.  Pharmacy has been consulted for piperacillin/tazobactam and vancomycin dosing.  Pt has PMH significant for metastatic squamous cell carcinoma - no chemotherapy since May 2019. Broad spectrum antibiotics started on admission for sepsis of unknown origin.   Today, 12/27/17  WBC 10.1  Afebrile  SCr slightly increased, stable  Vancomycin trough x 2 at goal near steady state  PCT 0.85 initially  Lactate 1.7 initially  Plan:  Continue Zosyn 3.375 g EI q 8h  Continue vancomycin 750 mg iv q 12 hours  F/U plans for abx  Follow renal function, clinical course, culture data  Will consider steady-state levels depending on plans for abx    Height: 5\' 7"  (170.2 cm) Weight: 220 lb (99.8 kg) IBW/kg (Calculated) : 61.6  Temp (24hrs), Avg:98 F (36.7 C), Min:97.9 F (36.6 C), Max:98 F (36.7 C)  Recent Labs  Lab 12/24/17 2107 12/24/17 2141 12/25/17 0219 12/25/17 0619 12/26/17 0636 12/26/17 1654 12/27/17 0509  WBC 24.1*  --   --  16.2* 20.4*  --  10.1  CREATININE  --  0.58  --  0.50 0.94  --  1.08*  LATICACIDVEN  --   --  1.8 1.7  --   --   --   VANCORANDOM  --   --   --   --   --  16 18    Estimated Creatinine Clearance: 72.3 mL/min (A) (by C-G formula based on SCr of 1.08 mg/dL (H)).    No Known Allergies  Antimicrobials this admission: 11/3 piperaicillin/tazobactam >>  11/3 vancomycin >>   Dose adjustments this admission: 11/4 d/c vancomycin standing order for significantly increased SCr 11/4 @ 16:54 = 16 mcg/ml : Vanc 750mg  IV x 1 dose 11/5 @ 05:00 = 18 mcg/ml: vancomycin 750 iv x 1 11/5 resume vancomycin 750 mg iv q 12h  Microbiology results: 11/3 BCx: NGTD 11/3 UCx: NG (final) 11/3 C diff: negative 11/3 stool: sent  Thank you for allowing pharmacy to be a part of this patient's care.  Ulice Dash D, PharmD Clinical Pharmacist 12/27/2017 10:14 AM

## 2017-12-27 NOTE — Progress Notes (Signed)
Initial Nutrition Assessment  DOCUMENTATION CODES:   Non-severe (moderate) malnutrition in context of chronic illness, Obesity unspecified  INTERVENTION:   -Continue Premier Protein BID, each supplement provides 160 kcal and 30 grams of protein.  -Encouraged family to bring foods and snacks that patient would like  NUTRITION DIAGNOSIS:   Moderate Malnutrition related to cancer and cancer related treatments, chronic illness as evidenced by percent weight loss, energy intake < or equal to 75% for > or equal to 1 month.  GOAL:   Patient will meet greater than or equal to 90% of their needs  MONITOR:   PO intake, Supplement acceptance, Weight trends, Labs, I & O's, GOC  REASON FOR ASSESSMENT:   Malnutrition Screening Tool    ASSESSMENT:   52 y.o. female with medical history significant of metastatic squamous cell carcinoma involving liver & sacrum with unknown primary site (stoped chemo in May), hypertension, hyperlipidemia, stroke, GERD, depression, atrial fibrillation on Xarelto, who presents with altered mental status.  Patient in room sleeping, just received some medication. Family in room attempted to wake patient but was unable to waken. Per pt's daughter and niece, pt has been eating poorly. Sips on liquids throughout the day but certain foods make her sick based on their taste and smell. Pt with some mouth pain. Pt's daughter states she can bring Biotene for patient to use.  Pt has been having N/V for the past several days. Has also had diarrhea, BMs are continue to be loose. Pt has been more bloated as well after eating.  Pt drinks Premier Protein drinks at home, likes a variety of fruits and is tolerating these food items. RD to send some fruit options along with pudding, yogurt and Magic Cup for patient to try when more awake.  Pt's niece states pt is never hungry but having options nearby would be helpful. Family is storing foods for patient like watermelon on ICU unit.    Per chart review, palliative care is following patient. Pt with poor prognosis. Recommend paracentesis for comfort.     Medications: IV Reglan TID, IV Zofran PRN Labs reviewed: CBGs: 167-219 Low Na, Mg Phos WNL   NUTRITION - FOCUSED PHYSICAL EXAM:  Nutrition focused physical exam shows no sign of depletion of muscle mass or body fat.  Diet Order:   Diet Order            Diet regular Room service appropriate? Yes; Fluid consistency: Thin  Diet effective now              EDUCATION NEEDS:   No education needs have been identified at this time  Skin:  Skin Assessment: Reviewed RN Assessment  Last BM:  11/5  Height:   Ht Readings from Last 1 Encounters:  12/24/17 5\' 7"  (1.702 m)    Weight:   Wt Readings from Last 1 Encounters:  12/24/17 99.8 kg    Ideal Body Weight:  61.4 kg  BMI:  Body mass index is 34.46 kg/m.  Estimated Nutritional Needs:   Kcal:  2100-2300  Protein:  90-100g  Fluid:  2.1L/day  Clayton Bibles, MS, RD, LDN [facility] Pager: [phone] After Hours Pager: [phone]

## 2017-12-27 NOTE — Progress Notes (Signed)
Pharmacy Antibiotic Note  STARLINA LAPRE is a 52 y.o. female admitted on 12/24/2017 with sepsis.  Pharmacy has been consulted for vancomycin dosing.  Plan: Redose vancomycin 750mg  iv x1  Will continue to follow and check UOP  Height: 5\' 7"  (170.2 cm) Weight: 220 lb (99.8 kg) IBW/kg (Calculated) : 61.6  Temp (24hrs), Avg:98 F (36.7 C), Min:97.9 F (36.6 C), Max:98 F (36.7 C)  Recent Labs  Lab 12/24/17 2107 12/24/17 2141 12/25/17 0219 12/25/17 0619 12/26/17 0636 12/26/17 1654 12/27/17 0509  WBC 24.1*  --   --  16.2* 20.4*  --  10.1  CREATININE  --  0.58  --  0.50 0.94  --  1.08*  LATICACIDVEN  --   --  1.8 1.7  --   --   --   VANCORANDOM  --   --   --   --   --  16 18    Estimated Creatinine Clearance: 72.3 mL/min (A) (by C-G formula based on SCr of 1.08 mg/dL (H)).    No Known Allergies  Antimicrobials this admission: 11/3 piperaicillin/tazobactam >>  11/3 vancomycin >>   Dose adjustments this admission: -  Microbiology results: -  Thank you for allowing pharmacy to be a part of this patient's care.  Nani Skillern Crowford 12/27/2017 6:25 AM

## 2017-12-28 ENCOUNTER — Inpatient Hospital Stay (HOSPITAL_COMMUNITY): Payer: 59

## 2017-12-28 DIAGNOSIS — I6330 Cerebral infarction due to thrombosis of unspecified cerebral artery: Secondary | ICD-10-CM

## 2017-12-28 DIAGNOSIS — A419 Sepsis, unspecified organism: Principal | ICD-10-CM

## 2017-12-28 LAB — MAGNESIUM: Magnesium: 2.6 mg/dL — ABNORMAL HIGH (ref 1.7–2.4)

## 2017-12-28 LAB — RENAL FUNCTION PANEL
ANION GAP: 11 (ref 5–15)
Albumin: 2.5 g/dL — ABNORMAL LOW (ref 3.5–5.0)
BUN: 14 mg/dL (ref 6–20)
CHLORIDE: 106 mmol/L (ref 98–111)
CO2: 19 mmol/L — ABNORMAL LOW (ref 22–32)
Calcium: 8.4 mg/dL — ABNORMAL LOW (ref 8.9–10.3)
Creatinine, Ser: 1.06 mg/dL — ABNORMAL HIGH (ref 0.44–1.00)
GFR calc Af Amer: >60 mL/min (ref >60)
GFR calc non Af Amer: 58 mL/min — ABNORMAL LOW (ref >60)
GLUCOSE: 166 mg/dL — AB (ref 70–99)
POTASSIUM: 3.7 mmol/L (ref 3.5–5.1)
Phosphorus: 2.9 mg/dL (ref 2.5–4.6)
Sodium: 136 mmol/L (ref 135–145)

## 2017-12-28 LAB — CBC WITH DIFFERENTIAL/PLATELET
Abs Immature Granulocytes: 0.18 10*3/uL — ABNORMAL HIGH (ref 0.00–0.07)
BASOS PCT: 0 %
Basophils Absolute: 0 10*3/uL (ref 0.0–0.1)
EOS ABS: 0 10*3/uL (ref 0.0–0.5)
EOS PCT: 0 %
HCT: 26.3 % — ABNORMAL LOW (ref 36.0–46.0)
Hemoglobin: 7.8 g/dL — ABNORMAL LOW (ref 12.0–15.0)
Immature Granulocytes: 1 %
Lymphocytes Relative: 8 %
Lymphs Abs: 1.3 10*3/uL (ref 0.7–4.0)
MCH: 27.5 pg (ref 26.0–34.0)
MCHC: 29.7 g/dL — AB (ref 30.0–36.0)
MCV: 92.6 fL (ref 80.0–100.0)
MONO ABS: 0.9 10*3/uL (ref 0.1–1.0)
MONOS PCT: 5 %
NEUTROS PCT: 86 %
Neutro Abs: 14 10*3/uL — ABNORMAL HIGH (ref 1.7–7.7)
PLATELETS: 31 10*3/uL — AB (ref 150–400)
RBC: 2.84 MIL/uL — ABNORMAL LOW (ref 3.87–5.11)
RDW: 19.2 % — AB (ref 11.5–15.5)
WBC: 16.4 10*3/uL — ABNORMAL HIGH (ref 4.0–10.5)
nRBC: 0 % (ref 0.0–0.2)

## 2017-12-28 LAB — GLUCOSE, CAPILLARY
GLUCOSE-CAPILLARY: 169 mg/dL — AB (ref 70–99)
Glucose-Capillary: 115 mg/dL — ABNORMAL HIGH (ref 70–99)
Glucose-Capillary: 166 mg/dL — ABNORMAL HIGH (ref 70–99)
Glucose-Capillary: 149 mg/dL — ABNORMAL HIGH (ref 70–99)

## 2017-12-28 LAB — BPAM PLATELET PHERESIS
Blood Product Expiration Date: 201911072359
ISSUE DATE / TIME: 201911051500
UNIT TYPE AND RH: 6200

## 2017-12-28 LAB — PREPARE PLATELET PHERESIS: Unit division: 0

## 2017-12-28 LAB — METHYLMALONIC ACID, SERUM: METHYLMALONIC ACID, QUANTITATIVE: 501 nmol/L — AB (ref 0–378)

## 2017-12-28 LAB — PROTIME-INR
INR: 2.27
Prothrombin Time: 24.7 seconds — ABNORMAL HIGH (ref 11.4–15.2)

## 2017-12-28 MED ORDER — METOCLOPRAMIDE HCL 5 MG/ML IJ SOLN
10.0000 mg | Freq: Two times a day (BID) | INTRAMUSCULAR | Status: DC
  Administered 2017-12-29 – 2017-12-30 (×3): 10 mg via INTRAVENOUS
  Filled 2017-12-28 (×3): qty 2

## 2017-12-28 MED ORDER — CITALOPRAM HYDROBROMIDE 20 MG PO TABS
10.0000 mg | ORAL_TABLET | Freq: Every day | ORAL | Status: DC
  Administered 2017-12-30 – 2018-01-01 (×3): 10 mg via ORAL
  Filled 2017-12-28 (×6): qty 1

## 2017-12-28 MED ORDER — ONDANSETRON HCL 4 MG/2ML IJ SOLN
4.0000 mg | Freq: Three times a day (TID) | INTRAMUSCULAR | Status: DC | PRN
  Administered 2017-12-31: 4 mg via INTRAVENOUS
  Filled 2017-12-28: qty 2

## 2017-12-28 MED ORDER — FENTANYL CITRATE (PF) 100 MCG/2ML IJ SOLN
25.0000 ug | INTRAMUSCULAR | Status: DC | PRN
  Administered 2017-12-29: 25 ug via INTRAVENOUS
  Filled 2017-12-28: qty 2

## 2017-12-28 MED ORDER — DEXAMETHASONE SODIUM PHOSPHATE 10 MG/ML IJ SOLN
8.0000 mg | Freq: Every day | INTRAMUSCULAR | Status: DC
  Administered 2017-12-29 – 2017-12-30 (×2): 8 mg via INTRAVENOUS
  Filled 2017-12-28 (×2): qty 0.8

## 2017-12-28 MED ORDER — IOPAMIDOL (ISOVUE-370) INJECTION 76%
INTRAVENOUS | Status: AC
  Filled 2017-12-28: qty 100

## 2017-12-28 MED ORDER — SODIUM CHLORIDE 0.9 % IV SOLN: INTRAVENOUS | Status: DC | PRN

## 2017-12-28 MED ORDER — ROPINIROLE HCL 1 MG PO TABS
1.0000 mg | ORAL_TABLET | Freq: Once | ORAL | Status: AC
  Administered 2017-12-28: 1 mg via ORAL
  Filled 2017-12-28: qty 1

## 2017-12-28 MED ORDER — SODIUM CHLORIDE (PF) 0.9 % IJ SOLN
INTRAMUSCULAR | Status: AC
  Filled 2017-12-28: qty 50

## 2017-12-28 MED ORDER — IOPAMIDOL (ISOVUE-370) INJECTION 76%
100.0000 mL | Freq: Once | INTRAVENOUS | Status: AC | PRN
  Administered 2017-12-28: 100 mL via INTRAVENOUS

## 2017-12-28 MED ORDER — FUROSEMIDE 10 MG/ML IJ SOLN
40.0000 mg | Freq: Once | INTRAMUSCULAR | Status: AC
  Administered 2017-12-28: 40 mg via INTRAVENOUS
  Filled 2017-12-28: qty 4

## 2017-12-28 MED ORDER — IOHEXOL 300 MG/ML  SOLN
30.0000 mL | Freq: Once | INTRAMUSCULAR | Status: AC | PRN
  Administered 2017-12-28: 30 mL via ORAL

## 2017-12-28 NOTE — Progress Notes (Signed)
Cindy Faulkner was lying in bed and awake when I arrived. A co-worker was bedside during the initial part of our visit. She was very positive and her faith was very strong. She talked about her recent health report and how her cancer is in her stomach and liver. She still said that God is in control. We talked about her family and she spoke of their support and how they have been staying with her. We talked about our work together when she was in ICU.  She was very grateful of our visit and prayer. Please page whenever additional support is needed. [place], MDiv   12/28/17 2000  Clinical Encounter Type  Visited With Patient

## 2017-12-28 NOTE — Progress Notes (Signed)
Request for therapeutic paracentesis  today due to abdominal bloating and dyspnea in setting of advanced metastatic disease. Patient initially not a candidate for paracentesis due to coagulopathy however this has improved and patient was brought to ultrasound for evaluation.  Limited abdominal ultrasound performed showing small amount of fluid in the RUQ which is not amenable to safe drainage at this time. Discussed with patient that she is unlikely to gain any therapeutic benefit from procedure today given small amount of fluid - she states understanding and agrees with decision to cancel procedure at this time. All questions answered, patient returned to floor in stable condition.  Please reorder paracentesis in the future if it is felt this patient would benefit from a re-evaluation. Please call IR with questions or concerns.  Cindy Norse, PA-C

## 2017-12-28 NOTE — Progress Notes (Signed)
Patient was complaining of pressure in her chest, pain 10/10 & shortness breath. Vital signs are stable. Cardiac monitoring shows normal sinus rhythm. Schorr was notified. Will continue to monitor patient.

## 2017-12-28 NOTE — Progress Notes (Signed)
PROGRESS NOTE    Cindy Faulkner  OAC:[id number] DOB: December 16, 1965 DOA: 12/24/2017 PCP: Cindy Odor, PA-C  Brief Narrative:  The patient is a 52 y.o.femalewith medical history significant ofmetastatic squamous cell carcinoma involving liver & sacrum with unknown primary site(stoped chemo in May),hypertension, hyperlipidemia, stroke, GERD, depression, atrial fibrillation on Xarelto, who presents with altered mental status.  According to the patient's husband, the patient has had a functional decline over the past 2 weeks, now having difficulty getting out of bed without assistance.  In the past 2 to 3 days prior to admission, the patient has had worsening oral intake and increasing lethargy.  The patient has had on and off low-grade temperatures for the past week with a T-max of 101.0 F approximately 3 days prior to this admission.  There has been no headaches, neck pain, chest pain, coughing, hemoptysis, diarrhea, dysuria, hematuria, hematochezia, melena.  Upon presentation, the patient was noted to have sodium 128 and WBC 24.1 and hemoglobin 8.0.  There is concern for infectious source.  Patient was started on empiric vancomycin and Zosyn. Pt admitted for further management.  Encephalopathy improved some but patient feels more SOB and distended. IVF discontinued and Paracentesis attempted but did not have enough fluid to tap. Palliative Care and Hematology/Oncology to re-evaluate and make further recommendations.   Assessment & Plan:   Principal Problem:   Acute metabolic encephalopathy Active Problems:   Essential hypertension   AF (paroxysmal atrial fibrillation) (HCC)   Cerebral thrombosis with cerebral infarction   Stroke (cerebrum) (HCC)   Metastasis to liver (HCC)   Metastatic squamous cell carcinoma involving liver & sacrum with unknown primary site    Hypokalemia   Anemia due to antineoplastic chemotherapy   MDD (major depressive disorder), recurrent severe, without psychosis  ([place])   Severe protein-calorie malnutrition (HCC)   Sepsis (HCC)   Hyponatremia   Thrombocytopenia (HCC)  Acute metabolic encephalopathy likely 2/2 worsening metastatic CA with possible sepsis of unknown origin; slightly improved  -Multifactorial including infectious etiology, hyponatremia, dehydration, pancytopenia, cancer of unknown origin ?Cervical Ca as primary -Currently afebrile, with improved leukocytosis -Urinalysis negative for pyuria, UC with no growth -Stool culture for C.diff negative -BC X 2, NGTD at 3 days  -Chest x-ray personally reviewed--no consolidations -Personally reviewed EKG--sinus rhythm, no ST-T wave changes -CT head negative for any mets -Continued empiric IV Zosyn, d/c IV Vancomycin as no MRSA growing -Palliative consulted due to very poor prognosis, made DNR -Appreciate further evaluation and recommendations by Palliative; May end up becoming Comfort Care given Palliative and Oncology talks.   Coagulopathy/Thrombocytopenia -No signs of active bleeding -Likely due to sepsis resulting in ??DIC from Cancer -Presenting INR 3.66, PTT 51, fibrinogen 226, DAT negative -D-dimer 14.95 -S/P transfused 2 units PRBC, s/p 1U of platelet 12/27/17 -Hematology/oncology on board and to re-evaluate today -Platelet Count improved to 31 but Hb/Hct dropped to 7.8/26.3   Intractable nausea/vomiting/abdominal distension/fullness -12/23/2017 CT abdomen and pelvis--interval progression of malignancy with progression of liver metastasis; new abdominal ascites with loculated fluid over the liver and peritoneal nodule in the right upper quadrant; new right upper lobe and right lower lobe lung nodule, multiple splenic and renal infarcts -Scheduled reglan, zyprexa for nausea, decadron as per palliative and Decadron dose has been reduced  -Palliative recommending therapeutic/comfort paracentesis. IR consulted, unable to do 12/27/17 due to coagulopathy and unable to do today as not enough  fluid -Palliative started pt on IV albumin until after paracentesis -Pain management with fentanyl -Palliative following and appreciate  [place] discussion   Cancer of unknown origin likely ?cervical CA -Follows Dr. Alvy Faulkner -Has mets to liver and sacrum which have worsened  -last Keytruda 2-3 weeks prior to admission -Holding off all chemo for now and her conditioning is such that she may not tolerate even salvage Chemo  Paroxysmal atrial fibrillation -Holding Rivaroxaban secondary to thrombocytopenia -Continue Flecainide 50 mg po Daily  -Presently in sinus rhythm  Hypokalemia/Hypomagnesemia -Patient's potassium is now 3.7 and patient magnesium is now 2.6 -Continue monitor and replete as necessary -Repeat CMP magnesium level in a.m.  Hyponatremia -due to poor oral intake and dehydration, improved -Discontinued IVF due to Volume Overload and was given 1x of IV Lasix  -Na+ is now 136 -Repeat CMP in AM   Anemia due to antineoplastic chemotherapy -This is likely anemia of chronic disease -Serum W09 elevated, folic acid level low -Iron 25, sats 23, ferritin 4,414 -Hb/Hct went from 8.6/28.9 -> 7.8/26.3 -Continue to Monitor for S/Sx of Bleeding  -Repeat CMP in AM   MDD (major depressive disorder), recurrent severe, without psychosis ([place]): -Continue Citalopram 20 mg po Daily  -C/w Olanzapine 2.5 mg po qHS  Severe Protein-Calorie Malnutrition  -Appreciate Nutritionist Recc's -C/w Protein Supplement Liquid 11 ounces BID   Leukocytosis -The setting of IV steroid demargination with Decadron use -Continue monitor for signs and symptoms of infection -Continue with empiric antibiotics with IV Vancomycin and IV   DVT prophylaxis: SCDs due to Thrombocytopenia Code Status: DO NOT RESUSCITATE Family Communication: Discussed with Daughter and Grand-Daughter at bedside  Disposition Plan: Pending further workup and discussion; Possible Hospice  Consultants:  Palliative  Care Hematology   Procedures:  Attempted Paracentesis    Antimicrobials:  Anti-infectives (From admission, onward)   Start     Dose/Rate Route Frequency Ordered Stop   12/27/17 1900  vancomycin (VANCOCIN) IVPB 750 mg/150 ml premix  Status:  Discontinued     750 mg 150 mL/hr over 60 Minutes Intravenous Every 12 hours 12/27/17 1043 12/27/17 1416   12/27/17 0630  vancomycin (VANCOCIN) IVPB 750 mg/150 ml premix     750 mg 150 mL/hr over 60 Minutes Intravenous  Once 12/27/17 0624 12/27/17 0745   12/26/17 1800  vancomycin (VANCOCIN) IVPB 750 mg/150 ml premix  Status:  Discontinued     750 mg 150 mL/hr over 60 Minutes Intravenous Every 12 hours 12/26/17 1737 12/26/17 1741   12/26/17 1800  vancomycin (VANCOCIN) IVPB 750 mg/150 ml premix     750 mg 150 mL/hr over 60 Minutes Intravenous  Once 12/26/17 1742 12/26/17 1906   12/26/17 1800  vancomycin variable dose per unstable renal function (pharmacist dosing)  Status:  Discontinued      Does not apply See admin instructions 12/26/17 1747 12/27/17 1043   12/26/17 0852  vancomycin variable dose per unstable renal function (pharmacist dosing)  Status:  Discontinued      Does not apply See admin instructions 12/26/17 0853 12/26/17 1737   12/25/17 1600  vancomycin (VANCOCIN) IVPB 750 mg/150 ml premix  Status:  Discontinued     750 mg 150 mL/hr over 60 Minutes Intravenous Every 12 hours 12/25/17 0824 12/26/17 0853   12/25/17 1200  vancomycin (VANCOCIN) IVPB 750 mg/150 ml premix  Status:  Discontinued     750 mg 150 mL/hr over 60 Minutes Intravenous Every 8 hours 12/25/17 0358 12/25/17 0824   12/25/17 1000  piperacillin-tazobactam (ZOSYN) IVPB 3.375 g     3.375 g 12.5 mL/hr over 240 Minutes Intravenous Every 8 hours 12/25/17 0349  12/25/17 0215  vancomycin (VANCOCIN) 2,000 mg in sodium chloride 0.9 % 500 mL IVPB     2,000 mg 250 mL/hr over 120 Minutes Intravenous  Once 12/25/17 0154 12/25/17 0504   12/25/17 0200  piperacillin-tazobactam  (ZOSYN) IVPB 3.375 g     3.375 g 100 mL/hr over 30 Minutes Intravenous NOW 12/25/17 0154 12/25/17 0333     Subjective: Seen and examined at bedside and states that she is a little bit more short of breath.  Still remains somewhat nauseous.  Denies any lightheadedness or dizziness.  Family feels that her legs have gotten more swollen overnight.  No other concerns recommend at this time  Objective: Vitals:   12/27/17 1648 12/27/17 2026 12/28/17 0342 12/28/17 0559  BP: 115/84 120/80 121/74 131/74  Pulse: 75 78 74 77  Resp: 20 20 19 18   Temp: 97.6 F (36.4 C) 97.9 F (36.6 C)  (!) 97.5 F (36.4 C)  TempSrc: Oral Oral  Oral  SpO2: 95% 96% 95% 100%  Weight:      Height:        Intake/Output Summary (Last 24 hours) at 12/28/2017 1436 Last data filed at 12/28/2017 0930 Gross per 24 hour  Intake 2534.04 ml  Output 0 ml  Net 2534.04 ml   Filed Weights   12/24/17 2104  Weight: 99.8 kg   Examination: Physical Exam:  Constitutional: WN/WD obese Caucasian female NAD and appears calm but uncomfortable Eyes: Lids and conjunctivae normal, sclerae anicteric  ENMT: External Ears, Nose appear normal.  Posterior Neck: Appears normal, supple, no cervical masses, normal ROM, no appreciable thyromegaly; no JVD Respiratory: Diminished to auscultation bilaterally, no wheezing, rales, rhonchi or crackles. Normal respiratory effort and patient is not tachypenic. No accessory muscle use.  Cardiovascular: RRR, no murmurs / rubs / gallops. S1 and S2 auscultated. 1+ LE extremity edema.  Abdomen: Soft, tender somewhat to palpate, Distended. Bowel sounds positive x4.  GU: Deferred. Musculoskeletal: No clubbing / cyanosis of digits/nails. No joint deformity upper and lower extremities.  Skin: No rashes, lesions, ulcers on a limited skin evaluation. No induration; Warm and dry.  Neurologic: CN 2-12 grossly intact with no focal deficits. Romberg sign and cerebellar reflexes not assessed.  Psychiatric:  Slightly impaired judgment and insight as she was confused . Alert and oriented x 2. Anxious mood and appropriate affect.   Data Reviewed: I have personally reviewed following labs and imaging studies  CBC: Recent Labs  Lab 12/24/17 2107  12/25/17 0619 12/25/17 1215 12/26/17 0636 12/27/17 0509 12/28/17 0331  WBC 24.1*  --  16.2*  --  20.4* 10.1 16.4*  NEUTROABS  --   --  14.1*  --  17.4* 8.7* 14.0*  HGB 8.0*  --  6.9*  --  8.9* 8.6* 7.8*  HCT 27.2*  --  23.3*  --  28.8* 28.9* 26.3*  MCV 89.8  --  90.0  --  90.0 91.2 92.6  PLT PLATELET CLUMPS NOTED ON SMEAR, COUNT APPEARS DECREASED   < > 30* 40* 28* 19* 31*   < > = values in this interval not displayed.   Basic Metabolic Panel: Recent Labs  Lab 12/24/17 2141 12/25/17 0245 12/25/17 0619 12/26/17 0636 12/27/17 0509 12/28/17 0331  NA 128*  --  130* 130* 132* 136  K 3.3*  --  3.3* 3.7 4.0 3.7  CL 93*  --  98 100 100 106  CO2 24  --  24 23 22  19*  GLUCOSE 128*  --  145* 169* 197*  166*  BUN 9  --  9 11 12 14   CREATININE 0.58  --  0.50 0.94 1.08* 1.06*  CALCIUM 8.2*  --  7.7* 7.8* 8.1* 8.4*  MG  --  1.3*  --  1.6* 1.0* 2.6*  PHOS  --   --   --  2.3* 3.1 2.9   GFR: Estimated Creatinine Clearance: 73.7 mL/min (A) (by C-G formula based on SCr of 1.06 mg/dL (H)). Liver Function Tests: Recent Labs  Lab 12/24/17 2141 12/25/17 0619 12/26/17 0636 12/27/17 0509 12/28/17 0331  AST 47* 39 38  --   --   ALT 24 20 23   --   --   ALKPHOS 168* 138* 154*  --   --   BILITOT 1.4* 1.4* 1.6*  --   --   PROT 5.8* 5.0* 5.1*  --   --   ALBUMIN 2.0* 1.7* 1.8* 2.3* 2.5*   Recent Labs  Lab 12/24/17 2141  LIPASE 18   Recent Labs  Lab 12/25/17 1215  AMMONIA 19   Coagulation Profile: Recent Labs  Lab 12/25/17 0047 12/25/17 1215 12/27/17 0509 12/28/17 1111  INR 3.66 3.10 2.89 2.27   Cardiac Enzymes: No results for input(s): CKTOTAL, CKMB, CKMBINDEX, TROPONINI in the last 168 hours. BNP (last 3 results) No results for  input(s): PROBNP in the last 8760 hours. HbA1C: No results for input(s): HGBA1C in the last 72 hours. CBG: Recent Labs  Lab 12/27/17 1134 12/27/17 1637 12/27/17 2127 12/28/17 0736 12/28/17 1150  GLUCAP 219* 158* 207* 115* 169*   Lipid Profile: No results for input(s): CHOL, HDL, LDLCALC, TRIG, CHOLHDL, LDLDIRECT in the last 72 hours. Thyroid Function Tests: No results for input(s): TSH, T4TOTAL, FREET4, T3FREE, THYROIDAB in the last 72 hours. Anemia Panel: Recent Labs    12/26/17 0636  FERRITIN 4,414*  TIBC 108*  IRON 25*   Sepsis Labs: Recent Labs  Lab 12/25/17 0219 12/25/17 0619  PROCALCITON 0.85  --   LATICACIDVEN 1.8 1.7    Recent Results (from the past 240 hour(s))  Culture, blood (Routine X 2) w Reflex to ID Panel     Status: None (Preliminary result)   Collection Time: 12/25/17  2:19 AM  Result Value Ref Range Status   Specimen Description   Final    BLOOD RIGHT PORTA CATH Performed at [facility] [address], [place], [place] [address]    Special Requests   Final    BOTTLES DRAWN AEROBIC AND ANAEROBIC Blood Culture adequate volume Performed at [facility] [address], [place], [place] [address]    Culture   Final    NO GROWTH 3 DAYS Performed at [facility], [place] [address] [place], [place] [address]    Report Status PENDING  Incomplete  Culture, blood (Routine X 2) w Reflex to ID Panel     Status: None (Preliminary result)   Collection Time: 12/25/17  2:19 AM  Result Value Ref Range Status   Specimen Description   Final    BLOOD LEFT FOREARM Performed at [facility] [address] [place], [place] [address]    Special Requests   Final    BOTTLES DRAWN AEROBIC AND ANAEROBIC Blood Culture adequate volume Performed at [facility] [address] [place], [place] [address]    Culture   Final    NO GROWTH 3 DAYS Performed at [facility],  [place] [address] [place], [place] [address]  Report Status PENDING  Incomplete  Urine Culture     Status: None   Collection Time: 12/25/17  6:30 AM  Result Value Ref Range Status   Specimen Description   Final    URINE, CLEAN CATCH Performed at [facility], [place] [address], [place], [place] [address]    Special Requests   Final    NONE Performed at [facility], [place] [address], [place], [place] [address]    Culture   Final    NO GROWTH Performed at [facility], [place] [address], [place], [place] [address]    Report Status 12/26/2017 FINAL  Final  C difficile quick scan w PCR reflex     Status: None   Collection Time: 12/25/17  3:47 PM  Result Value Ref Range Status   C Diff antigen NEGATIVE NEGATIVE Final   C Diff toxin NEGATIVE NEGATIVE Final   C Diff interpretation No C. difficile detected.  Final    Comment: Performed at [facility], [place] [address], [place], [place] [address]  Stool culture (children & immunocomp patients)     Status: None (Preliminary result)   Collection Time: 12/25/17  3:47 PM  Result Value Ref Range Status   Salmonella/Shigella Screen PENDING  Incomplete   Campylobacter Culture PENDING  Incomplete   E coli, Shiga toxin Assay Negative Negative Final    Comment: (NOTE) Performed At: [facility] [place], [place] [id number] Rush Farmer MD NL:[id number]     Radiology Studies: No results found.   Scheduled Meds: . sodium chloride   Intravenous Once  . citalopram  20 mg Oral Daily  . dexamethasone  4 mg Intravenous Q12H  . flecainide  50 mg Oral Daily  . insulin aspart  0-9 Units Subcutaneous TID WC  . metoCLOPramide (REGLAN) injection  10 mg Intravenous TID  . OLANZapine zydis  2.5 mg Oral QHS  . pantoprazole  40 mg Oral Daily  . protein supplement shake  11 oz Oral BID BM   Continuous Infusions: . 0.9 % NaCl with KCl 20 mEq / L 100 mL/hr at 12/28/17 1435  .  albumin human 12.5 g (12/28/17 1222)  . piperacillin-tazobactam (ZOSYN)  IV 3.375 g (12/28/17 0956)    LOS: 3 days   Kerney Elbe, DO [facility] PAGER is on AMION  If 7PM-7AM, please contact night-coverage [email or web address] Password [email or web address] 12/28/2017, 2:36 PM

## 2017-12-29 LAB — MAGNESIUM: MAGNESIUM: 1.9 mg/dL (ref 1.7–2.4)

## 2017-12-29 LAB — COMPREHENSIVE METABOLIC PANEL
ALT: 21 U/L (ref 0–44)
AST: 40 U/L (ref 15–41)
Albumin: 3.1 g/dL — ABNORMAL LOW (ref 3.5–5.0)
Alkaline Phosphatase: 231 U/L — ABNORMAL HIGH (ref 38–126)
Anion gap: 12 (ref 5–15)
BUN: 14 mg/dL (ref 6–20)
CHLORIDE: 103 mmol/L (ref 98–111)
CO2: 21 mmol/L — ABNORMAL LOW (ref 22–32)
CREATININE: 1.1 mg/dL — AB (ref 0.44–1.00)
Calcium: 9 mg/dL (ref 8.9–10.3)
GFR calc Af Amer: >60 mL/min (ref >60)
GFR, EST NON AFRICAN AMERICAN: 56 mL/min — AB (ref >60)
Glucose, Bld: 132 mg/dL — ABNORMAL HIGH (ref 70–99)
POTASSIUM: 3.6 mmol/L (ref 3.5–5.1)
SODIUM: 136 mmol/L (ref 135–145)
Total Bilirubin: 1.4 mg/dL — ABNORMAL HIGH (ref 0.3–1.2)
Total Protein: 5.9 g/dL — ABNORMAL LOW (ref 6.5–8.1)

## 2017-12-29 LAB — CBC WITH DIFFERENTIAL/PLATELET
ABS IMMATURE GRANULOCYTES: 0.2 10*3/uL — AB (ref 0.00–0.07)
BASOS PCT: 0 %
Basophils Absolute: 0 10*3/uL (ref 0.0–0.1)
EOS ABS: 0 10*3/uL (ref 0.0–0.5)
Eosinophils Relative: 0 %
HCT: 27.4 % — ABNORMAL LOW (ref 36.0–46.0)
Hemoglobin: 8.2 g/dL — ABNORMAL LOW (ref 12.0–15.0)
Immature Granulocytes: 1 %
Lymphocytes Relative: 8 %
Lymphs Abs: 1.4 10*3/uL (ref 0.7–4.0)
MCH: 28.2 pg (ref 26.0–34.0)
MCHC: 29.9 g/dL — ABNORMAL LOW (ref 30.0–36.0)
MCV: 94.2 fL (ref 80.0–100.0)
Monocytes Absolute: 1.1 10*3/uL — ABNORMAL HIGH (ref 0.1–1.0)
Monocytes Relative: 6 %
NEUTROS PCT: 85 %
Neutro Abs: 16 10*3/uL — ABNORMAL HIGH (ref 1.7–7.7)
PLATELETS: 18 10*3/uL — AB (ref 150–400)
RBC: 2.91 MIL/uL — AB (ref 3.87–5.11)
RDW: 19.3 % — AB (ref 11.5–15.5)
WBC: 18.8 10*3/uL — AB (ref 4.0–10.5)
nRBC: 0 % (ref 0.0–0.2)

## 2017-12-29 LAB — GLUCOSE, CAPILLARY: GLUCOSE-CAPILLARY: 120 mg/dL — AB (ref 70–99)

## 2017-12-29 LAB — PHOSPHORUS: PHOSPHORUS: 3.2 mg/dL (ref 2.5–4.6)

## 2017-12-29 MED ORDER — HYDROMORPHONE HCL 1 MG/ML IJ SOLN
1.0000 mg | INTRAMUSCULAR | Status: DC | PRN
  Administered 2017-12-29: 1 mg via INTRAVENOUS
  Filled 2017-12-29: qty 1

## 2017-12-29 MED ORDER — OXYCODONE HCL 5 MG PO TABS
5.0000 mg | ORAL_TABLET | Freq: Four times a day (QID) | ORAL | Status: DC | PRN
  Administered 2017-12-29: 10 mg via ORAL
  Filled 2017-12-29: qty 2

## 2017-12-29 MED ORDER — OLANZAPINE 5 MG PO TBDP
5.0000 mg | ORAL_TABLET | Freq: Every day | ORAL | Status: DC
  Administered 2017-12-29 – 2018-01-05 (×6): 5 mg via ORAL
  Filled 2017-12-29 (×8): qty 1

## 2017-12-29 MED ORDER — FENTANYL CITRATE (PF) 100 MCG/2ML IJ SOLN
12.5000 ug | INTRAMUSCULAR | Status: DC | PRN
  Administered 2017-12-30: 12.5 ug via INTRAVENOUS
  Administered 2017-12-30: 25 ug via INTRAVENOUS
  Administered 2017-12-30: 12.5 ug via INTRAVENOUS
  Administered 2017-12-30: 25 ug via INTRAVENOUS
  Administered 2017-12-31 – 2018-01-01 (×3): 12.5 ug via INTRAVENOUS
  Administered 2018-01-01: 25 ug via INTRAVENOUS
  Administered 2018-01-02: 12.5 ug via INTRAVENOUS
  Administered 2018-01-04: 25 ug via INTRAVENOUS
  Administered 2018-01-04: 12.5 ug via INTRAVENOUS
  Administered 2018-01-04 (×2): 25 ug via INTRAVENOUS
  Administered 2018-01-04: 12.5 ug via INTRAVENOUS
  Administered 2018-01-05: 25 ug via INTRAVENOUS
  Administered 2018-01-05: 12.5 ug via INTRAVENOUS
  Administered 2018-01-05: 25 ug via INTRAVENOUS
  Filled 2017-12-29 (×18): qty 2

## 2017-12-29 NOTE — Progress Notes (Signed)
Pt was lying in bed when I arrived. Her husband, son, daughter-in-law, grandson and nieces were bedside. She said she had a much better day today and a good nap. She said she was feeling better. She wanted prayer, and her family joined as we prayed. We talked of how we first met - our names are in common and her daughter's name is [name]. Pt is very special and pleasant.  Please page if any changes or if she needs additional support.  [place], MDiv   12/29/17 1800  Clinical Encounter Type  Visited With Patient and family together

## 2017-12-29 NOTE — Progress Notes (Signed)
PROGRESS NOTE    Cindy Faulkner  MHD:[id number] DOB: 1965-08-03 DOA: 12/24/2017 PCP: Lennie Odor, PA-C  Brief Narrative:  The patient is a 52 y.o.femalewith medical history significant ofmetastatic squamous cell carcinoma involving liver & sacrum with unknown primary site(stoped chemo in May),hypertension, hyperlipidemia, stroke, GERD, depression, atrial fibrillation on Xarelto, who presents with altered mental status.  According to the patient's husband, the patient has had a functional decline over the past 2 weeks, now having difficulty getting out of bed without assistance.  In the past 2 to 3 days prior to admission, the patient has had worsening oral intake and increasing lethargy.  The patient has had on and off low-grade temperatures for the past week with a T-max of 101.0 F approximately 3 days prior to this admission.  There has been no headaches, neck pain, chest pain, coughing, hemoptysis, diarrhea, dysuria, hematuria, hematochezia, melena.  Upon presentation, the patient was noted to have sodium 128 and WBC 24.1 and hemoglobin 8.0.  There is concern for infectious source.  Patient was started on empiric vancomycin and Zosyn. Pt admitted for further management.  Encephalopathy improved some but patient feels more SOB and distended. IVF discontinued and Paracentesis attempted but did not have enough fluid to tap. Palliative Care and Hematology/Oncology to re-evaluate and make further recommendations. After exentensive discussion with Hematology/Oncology and Palliative, Goals of Care are going to be shifted to Comfort Care. Palliative helping in the process of arranging Hospice.   Assessment & Plan:   Principal Problem:   Acute metabolic encephalopathy Active Problems:   Essential hypertension   AF (paroxysmal atrial fibrillation) (HCC)   Cerebral thrombosis with cerebral infarction   Stroke (cerebrum) (HCC)   Metastasis to liver (HCC)   Metastatic squamous cell carcinoma  involving liver & sacrum with unknown primary site    Hypokalemia   Anemia due to antineoplastic chemotherapy   MDD (major depressive disorder), recurrent severe, without psychosis ([place])   Severe protein-calorie malnutrition (HCC)   Sepsis (HCC)   Hyponatremia   Thrombocytopenia (HCC)  Acute metabolic encephalopathy likely 2/2 worsening metastatic CA with possible sepsis of unknown origin; slightly improved  -Multifactorial including infectious etiology, hyponatremia, dehydration, pancytopenia, cancer of unknown origin ?Cervical Ca as primary -Currently afebrile, with improved leukocytosis -Urinalysis negative for pyuria, UC with no growth -Stool culture for C.diff negative -BC X 2, NGTD at 4 days  -Chest x-ray personally reviewed--no consolidations -Personally reviewed EKG--sinus rhythm, no ST-T wave changes -CT head negative for any mets -Continued empiric IV Zosyn, d/c IV Vancomycin as no MRSA growing -Palliative consulted due to very poor prognosis, made DNR -Appreciate further evaluation and recommendations by Palliative; May end up becoming Comfort Care given Palliative and Oncology talks.  -Family requested Repeat CT Scan for confirmation of disease and prognostication and was done last night -Showed Metastatic Disease   Coagulopathy/Thrombocytopenia -No signs of active bleeding -Likely due to sepsis resulting in ??DIC from Cancer -Presenting INR 3.66, PTT 51, fibrinogen 226, DAT negative -D-dimer 14.95 -S/P transfused 2 units PRBC, s/p 1U of platelet 12/27/17 -Hematology/oncology on board and to re-evaluate today -Platelet Count now 18 and  but Hb/Hct dropped to 8.2/27.4 -Will not check Bloodwork now that patient is transitioning to comfort  Intractable nausea/vomiting/abdominal distension/fullness -12/23/2017 CT abdomen and pelvis--interval progression of malignancy with progression of liver metastasis; new abdominal ascites with loculated fluid over the liver and  peritoneal nodule in the right upper quadrant; new right upper lobe and right lower lobe lung nodule, multiple splenic and  renal infarcts -Scheduled reglan, zyprexa for nausea, decadron as per palliative and Decadron dose has been reduced  -Palliative recommending therapeutic/comfort paracentesis. IR consulted, unable to do 12/27/17 due to coagulopathy and unable to do today as not enough fluid -Palliative started pt on IV albumin until after paracentesis -Pain management with fentanyl changed to Dilaudid and adde po Oxycodone -Palliative following and appreciate GOC discussion   Cancer of unknown origin likely ?cervical CA -Follows Dr. Alvy Bimler -Has mets to liver and sacrum which have worsened  -last Keytruda 2-3 weeks prior to admission -Holding off all chemo for now and her conditioning is such that she may not tolerate even salvage Chemo -Repeat CT Scan done at family request last night to show disease progression and prognostication -Palliative and Oncology working with patient and family and GOC is transitioning towards comfort care -Palliative helping arrange Hospice and continuing talks   Paroxysmal atrial fibrillation -Holding Rivaroxaban secondary to thrombocytopenia -Continue Flecainide 50 mg po Daily  -Presently in sinus rhythm  Hypokalemia/Hypomagnesemia -Patient's potassium is now 3.6 and patient magnesium is now 1.9 -Continue monitor and replete as necessary -Will not Repeat CMP as Goals are transitioning toward comfort  Hyponatremia -due to poor oral intake and dehydration, improved -Discontinued IVF due to Volume Overload and was given 1x of IV Lasix  -Na+ is now 136 -Will not Repeat CMP as Goals are transitioning toward comfort  Anemia due to antineoplastic chemotherapy -This is likely anemia of chronic disease -Serum F35 elevated, folic acid level low -Iron 25, sats 23, ferritin 4,414 -Hb/Hct went from 8.6/28.9 -> 7.8/26.3 -> 8.2/27.4 -Continue to Monitor  for S/Sx of Bleeding  -Will not Repeat CBC as Goals are transitioning toward comfort  MDD (major depressive disorder), recurrent severe, without psychosis ([place]): -Continue Citalopram 20 mg po Daily  -C/w Olanzapine 2.5 mg po qHS  Severe Protein-Calorie Malnutrition  -Appreciate Nutritionist Recc's -C/w Protein Supplement Liquid 11 ounces BID   Leukocytosis -The setting of IV steroid demargination with Decadron use; WBC trended up to 18.8 -Now on 8 mg of IV Decadron Daily -Continue monitor for signs and symptoms of infection -Continue with empiric antibiotics with IV Zosyn for now as patient wants to continue Abx but [place] discussion transitioning patient towards Full Comfort Care and Hospice   DVT prophylaxis: SCDs due to Thrombocytopenia Code Status: DO NOT RESUSCITATE Family Communication: Discussed with Daughter and Grand-Daughter at bedside  Disposition Plan: Pending further workup and discussion; Possible Hospice Residential vs. Home  Consultants:  Palliative Care Hematology   Procedures:  Attempted Paracentesis    Antimicrobials:  Anti-infectives (From admission, onward)   Start     Dose/Rate Route Frequency Ordered Stop   12/27/17 1900  vancomycin (VANCOCIN) IVPB 750 mg/150 ml premix  Status:  Discontinued     750 mg 150 mL/hr over 60 Minutes Intravenous Every 12 hours 12/27/17 1043 12/27/17 1416   12/27/17 0630  vancomycin (VANCOCIN) IVPB 750 mg/150 ml premix     750 mg 150 mL/hr over 60 Minutes Intravenous  Once 12/27/17 0624 12/27/17 0745   12/26/17 1800  vancomycin (VANCOCIN) IVPB 750 mg/150 ml premix  Status:  Discontinued     750 mg 150 mL/hr over 60 Minutes Intravenous Every 12 hours 12/26/17 1737 12/26/17 1741   12/26/17 1800  vancomycin (VANCOCIN) IVPB 750 mg/150 ml premix     750 mg 150 mL/hr over 60 Minutes Intravenous  Once 12/26/17 1742 12/26/17 1906   12/26/17 1800  vancomycin variable dose per unstable renal function (pharmacist  dosing)  Status:   Discontinued      Does not apply See admin instructions 12/26/17 1747 12/27/17 1043   12/26/17 0852  vancomycin variable dose per unstable renal function (pharmacist dosing)  Status:  Discontinued      Does not apply See admin instructions 12/26/17 0853 12/26/17 1737   12/25/17 1600  vancomycin (VANCOCIN) IVPB 750 mg/150 ml premix  Status:  Discontinued     750 mg 150 mL/hr over 60 Minutes Intravenous Every 12 hours 12/25/17 0824 12/26/17 0853   12/25/17 1200  vancomycin (VANCOCIN) IVPB 750 mg/150 ml premix  Status:  Discontinued     750 mg 150 mL/hr over 60 Minutes Intravenous Every 8 hours 12/25/17 0358 12/25/17 0824   12/25/17 1000  piperacillin-tazobactam (ZOSYN) IVPB 3.375 g     3.375 g 12.5 mL/hr over 240 Minutes Intravenous Every 8 hours 12/25/17 0349     12/25/17 0215  vancomycin (VANCOCIN) 2,000 mg in sodium chloride 0.9 % 500 mL IVPB     2,000 mg 250 mL/hr over 120 Minutes Intravenous  Once 12/25/17 0154 12/25/17 0504   12/25/17 0200  piperacillin-tazobactam (ZOSYN) IVPB 3.375 g     3.375 g 100 mL/hr over 30 Minutes Intravenous NOW 12/25/17 0154 12/25/17 0333     Subjective: Seen and examined at bedside and states she had a restful night.  Was complaining of lower abdominal cramping and pain and states that the oxycodone has helped a little bit.  Denies any chest pain, lightheadedness or dizziness.  Feels less swollen today after the Lasix.  No other concerns at this time and family met with palliative care later as well.  Objective: Vitals:   12/28/17 0559 12/28/17 1520 12/28/17 2014 12/29/17 0411  BP: 131/74 138/84 (!) 138/92 (!) 147/85  Pulse: 77 82 85 97  Resp: _0 Temp: (!) 97.5 F (36.4 C) (!) 97.5 F (36.4 C)  97.7 F (36.5 C)  TempSrc: Oral Oral  Oral  SpO2: 100% 98% 100% 100%  Weight:      Height:        Intake/Output Summary (Last 24 hours) at 12/29/2017 2000 Last data filed at 12/29/2017 7628 Gross per 24 hour  Intake 250 ml  Output 1452 ml    Net -1202 ml   Filed Weights   12/24/17 2104  Weight: 99.8 kg   Examination: Physical Exam:  Constitutional: WN/WD obese Caucasian female who appears calm and comfortable  Eyes: Lids and conjunctivae normal. Sclerae anicteric ENMT: External Ears and Nose appear normal. Grossly normally  Neck: Appears supple with no JVD Respiratory: Diminished to auscultation bilaterally. No appreciable wheezing/rales/rhonchi.  Cardiovascular: RRR; no m/r/r. 1+ LE edema  Abdomen: Soft, tender to palpate in the Lower Quadrants.  GU: Deferred.  Musculoskeletal: No contractures or cyanosis. No joint deformities noted.  Skin: No appreciable rashes or lesions on a limited skin evaluation   Neurologic: CN 2-12 grossly intact with no appreciable deficits.  Psychiatric: Pleasant mood and affect. Intact Judgement and Insight. Alert and Oriented x2.   Data Reviewed: I have personally reviewed following labs and imaging studies  CBC: Recent Labs  Lab 12/25/17 0619 12/25/17 1215 12/26/17 0636 12/27/17 0509 12/28/17 0331 12/29/17 0344  WBC 16.2*  --  20.4* 10.1 16.4* 18.8*  NEUTROABS 14.1*  --  17.4* 8.7* 14.0* 16.0*  HGB 6.9*  --  8.9* 8.6* 7.8* 8.2*  HCT 23.3*  --  28.8* 28.9* 26.3* 27.4*  MCV 90.0  --  90.0 91.2 92.6  94.2  PLT 30* 40* 28* 19* 31* 18*   Basic Metabolic Panel: Recent Labs  Lab 12/25/17 0245 12/25/17 0619 12/26/17 0636 12/27/17 0509 12/28/17 0331 12/29/17 0344  NA  --  130* 130* 132* 136 136  K  --  3.3* 3.7 4.0 3.7 3.6  CL  --  98 100 100 106 103  CO2  --  _0 19* 21*  GLUCOSE  --  145* 169* 197* 166* 132*  BUN  --  _1 CREATININE  --  0.50 0.94 1.08* 1.06* 1.10*  CALCIUM  --  7.7* 7.8* 8.1* 8.4* 9.0  MG 1.3*  --  1.6* 1.0* 2.6* 1.9  PHOS  --   --  2.3* 3.1 2.9 3.2   GFR: Estimated Creatinine Clearance: 71 mL/min (A) (by C-G formula based on SCr of 1.1 mg/dL (H)). Liver Function Tests: Recent Labs  Lab 12/24/17 2141 12/25/17 0619 12/26/17 0636  12/27/17 0509 12/28/17 0331 12/29/17 0344  AST 47* 39 38  --   --  40  ALT _2 --   --  21  ALKPHOS 168* 138* 154*  --   --  231*  BILITOT 1.4* 1.4* 1.6*  --   --  1.4*  PROT 5.8* 5.0* 5.1*  --   --  5.9*  ALBUMIN 2.0* 1.7* 1.8* 2.3* 2.5* 3.1*   Recent Labs  Lab 12/24/17 2141  LIPASE 18   Recent Labs  Lab 12/25/17 1215  AMMONIA 19   Coagulation Profile: Recent Labs  Lab 12/25/17 0047 12/25/17 1215 12/27/17 0509 12/28/17 1111  INR 3.66 3.10 2.89 2.27   Cardiac Enzymes: No results for input(s): CKTOTAL, CKMB, CKMBINDEX, TROPONINI in the last 168 hours. BNP (last 3 results) No results for input(s): PROBNP in the last 8760 hours. HbA1C: No results for input(s): HGBA1C in the last 72 hours. CBG: Recent Labs  Lab 12/28/17 0736 12/28/17 1150 12/28/17 1647 12/28/17 2104 12/29/17 0735  GLUCAP 115* 169* 166* 149* 120*   Lipid Profile: No results for input(s): CHOL, HDL, LDLCALC, TRIG, CHOLHDL, LDLDIRECT in the last 72 hours. Thyroid Function Tests: No results for input(s): TSH, T4TOTAL, FREET4, T3FREE, THYROIDAB in the last 72 hours. Anemia Panel: No results for input(s): VITAMINB12, FOLATE, FERRITIN, TIBC, IRON, RETICCTPCT in the last 72 hours. Sepsis Labs: Recent Labs  Lab 12/25/17 0219 12/25/17 0619  PROCALCITON 0.85  --   LATICACIDVEN 1.8 1.7    Recent Results (from the past 240 hour(s))  Culture, blood (Routine X 2) w Reflex to ID Panel     Status: None (Preliminary result)   Collection Time: 12/25/17  2:19 AM  Result Value Ref Range Status   Specimen Description   Final    BLOOD RIGHT PORTA CATH Performed at [facility] [address], [place], [place] [address]    Special Requests   Final    BOTTLES DRAWN AEROBIC AND ANAEROBIC Blood Culture adequate volume Performed at [facility] [address], [place], [place] [address]    Culture   Final    NO GROWTH 4 DAYS Performed at [facility],  [place] [address] [place], [place] [address]    Report Status PENDING  Incomplete  Culture, blood (Routine X 2) w Reflex to ID Panel     Status: None (Preliminary result)   Collection Time: 12/25/17  2:19 AM  Result Value Ref Range Status   Specimen Description   Final  BLOOD LEFT FOREARM Performed at [facility] [address] [place], [place] [address]    Special Requests   Final    BOTTLES DRAWN AEROBIC AND ANAEROBIC Blood Culture adequate volume Performed at [facility] [address] [place], [place] [address]    Culture   Final    NO GROWTH 4 DAYS Performed at [facility], [place] [address], [place], [place] [address]    Report Status PENDING  Incomplete  Urine Culture     Status: None   Collection Time: 12/25/17  6:30 AM  Result Value Ref Range Status   Specimen Description   Final    URINE, CLEAN CATCH Performed at [facility], [place] [address], [place], [place] [address]    Special Requests   Final    NONE Performed at [facility], [place] [address], [place], [place] [address]    Culture   Final    NO GROWTH Performed at [facility], [place] [address] [place], [place] [address]    Report Status 12/26/2017 FINAL  Final  C difficile quick scan w PCR reflex     Status: None   Collection Time: 12/25/17  3:47 PM  Result Value Ref Range Status   C Diff antigen NEGATIVE NEGATIVE Final   C Diff toxin NEGATIVE NEGATIVE Final   C Diff interpretation No C. difficile detected.  Final    Comment: Performed at [facility], [place] [address], [place], [place] [address]  Stool culture (children & immunocomp patients)     Status: None (Preliminary result)   Collection Time: 12/25/17  3:47 PM  Result Value Ref Range Status   Salmonella/Shigella Screen Final report  Final   Campylobacter Culture PENDING  Incomplete   E coli, Shiga toxin Assay Negative Negative Final    Comment:  (NOTE) Performed At: [facility] [place], [place] [id number] Rush Farmer MD MG:[id number]   STOOL CULTURE REFLEX - RSASHR     Status: None   Collection Time: 12/25/17  3:47 PM  Result Value Ref Range Status   Stool Culture result 1 (RSASHR) Comment  Final    Comment: (NOTE) No Salmonella or Shigella recovered. Performed At: [facility] [place], [place] [id number] Rush Farmer MD DX:[id number]     Radiology Studies: Ct Angio Chest Pe W Or Wo Contrast  Result Date: 12/28/2017 CLINICAL DATA:  Metastatic squamous cell carcinoma of unknown primary. Presenting with altered mental status, functional decline over 2 weeks, fevers. Increasing lethargy. EXAM: CT ANGIOGRAPHY CHEST CT ABDOMEN AND PELVIS WITH CONTRAST TECHNIQUE: Multidetector CT imaging of the chest was performed using the standard protocol during bolus administration of intravenous contrast. Multiplanar CT image reconstructions and MIPs were obtained to evaluate the vascular anatomy. Multidetector CT imaging of the abdomen and pelvis was performed using the standard protocol during bolus administration of intravenous contrast. CONTRAST:  19m ISOVUE-370 IOPAMIDOL (ISOVUE-370) INJECTION 76%, 359mOMNIPAQUE IOHEXOL 300 MG/ML SOLN COMPARISON:  CT chest abdomen and pelvis 12/23/2017 FINDINGS: CTA CHEST FINDINGS Cardiovascular: Moderately good opacification of the central and segmental pulmonary arteries. No focal filling defects. No evidence of significant pulmonary embolus. Normal caliber thoracic aorta. Great vessel origins are patent. Normal heart size. No pericardial effusion. Mediastinum/Nodes: Right central venous catheter with tip in the low SVC region. Esophagus is decompressed. No significant lymphadenopathy in the chest. Lungs/Pleura: Bilateral pleural effusions, larger on the right and increasing since previous study.  Bilateral basilar consolidation or atelectasis, also greater on  the right and also increasing. Possible superimposed pneumonia. Right upper lung nodule again demonstrated measuring 9 mm diameter. Evaluation of lungs is limited due to motion artifact. Airways are patent. No pneumothorax. Musculoskeletal: No destructive bone lesions in the chest. Review of the MIP images confirms the above findings. CT ABDOMEN and PELVIS FINDINGS Hepatobiliary: Multiple renal hand sing liver lesions demonstrated throughout the liver consistent with multiple hepatic metastasis. No significant change. Gallbladder is contracted. Gallbladder wall edema is likely related to liver disease. No bile duct dilatation. Pancreas: Unremarkable. No pancreatic ductal dilatation or surrounding inflammatory changes. Spleen: Wedge-shaped perfusion defects in the spleen likely represent splenic infarcts. No change. Adrenals/Urinary Tract: No adrenal gland nodules. Mild renal parenchymal scarring. Nonobstructing stone in the midpole right kidney. Heterogeneous left nephrogram with focal perfusion defect likely representing infarct. No change. Bladder is unremarkable. Stomach/Bowel: Stomach, small bowel, and colon are not abnormally distended. No wall thickening is appreciated. Appendix is not identified. Vascular/Lymphatic: Scattered aortic calcification. No aortic aneurysm. Scattered retroperitoneal lymph nodes are not pathologically enlarged. Reproductive: Uterus and ovaries are not enlarged. Other: Diffuse abdominal and pelvic ascites, mildly increased since previous study. Mesenteric and omental nodularity consistent with peritoneal carcinomatosis. Small periumbilical hernia containing fat. Edema throughout the subcutaneous fat. Musculoskeletal: Focal sacral bone lesion likely representing metastasis. Review of the MIP images confirms the above findings. IMPRESSION: 1. No evidence of significant pulmonary embolus. 2. Bilateral pleural effusions with basilar atelectasis and consolidation, greater on the right, and  increasing since previous study. 3. Right upper lung nodule, likely metastatic, no change. 4. Multiple diffuse liver metastases, no change. 5. Diffuse abdominal ascites and mesenteric/omental nodularity consistent with peritoneal carcinomatosis. Mildly increased ascites. 6. Focal sacral bone lesion as before. 7. 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  No change. Electronically Signed   By: Lucienne Capers M.D.   On: 12/28/2017 23:13   Ct Abdomen Pelvis W Contrast  Result Date: 12/28/2017 CLINICAL DATA:  Metastatic squamous cell carcinoma of unknown primary. Presenting with altered mental status, functional decline over 2 weeks, fevers. Increasing lethargy. EXAM: CT ANGIOGRAPHY CHEST CT ABDOMEN AND PELVIS WITH CONTRAST TECHNIQUE: Multidetector CT imaging of the chest was performed using the standard protocol during bolus administration of intravenous contrast. Multiplanar CT image reconstructions and MIPs were obtained to evaluate the vascular anatomy. Multidetector CT imaging of the abdomen and pelvis was performed using the standard protocol during bolus administration of intravenous contrast. CONTRAST:  121m ISOVUE-370 IOPAMIDOL (ISOVUE-370) INJECTION 76%, 373mOMNIPAQUE IOHEXOL 300 MG/ML SOLN COMPARISON:  CT chest abdomen and pelvis 12/23/2017 FINDINGS: CTA CHEST FINDINGS Cardiovascular: Moderately good opacification of the central and segmental pulmonary arteries. No focal filling defects. No evidence of significant pulmonary embolus. Normal caliber thoracic aorta. Great vessel origins are patent. Normal heart size. No pericardial effusion. Mediastinum/Nodes: Right central venous catheter with tip in the low SVC region. Esophagus is decompressed. No significant lymphadenopathy in the chest. Lungs/Pleura: Bilateral pleural effusions, larger on the right and increasing since previous study. Bilateral basilar consolidation or atelectasis, also greater on the right and also increasing. Possible  superimposed pneumonia. Right upper lung nodule again demonstrated measuring 9 mm diameter. Evaluation of lungs is limited due to motion artifact. Airways are patent. No pneumothorax. Musculoskeletal: No destructive bone lesions in the chest. Review of the MIP images confirms the above findings. CT ABDOMEN and PELVIS FINDINGS Hepatobiliary: Multiple renal hand sing liver lesions demonstrated throughout the liver consistent with multiple hepatic  metastasis. No significant change. Gallbladder is contracted. Gallbladder wall edema is likely related to liver disease. No bile duct dilatation. Pancreas: Unremarkable. No pancreatic ductal dilatation or surrounding inflammatory changes. Spleen: Wedge-shaped perfusion defects in the spleen likely represent splenic infarcts. No change. Adrenals/Urinary Tract: No adrenal gland nodules. Mild renal parenchymal scarring. Nonobstructing stone in the midpole right kidney. Heterogeneous left nephrogram with focal perfusion defect likely representing infarct. No change. Bladder is unremarkable. Stomach/Bowel: Stomach, small bowel, and colon are not abnormally distended. No wall thickening is appreciated. Appendix is not identified. Vascular/Lymphatic: Scattered aortic calcification. No aortic aneurysm. Scattered retroperitoneal lymph nodes are not pathologically enlarged. Reproductive: Uterus and ovaries are not enlarged. Other: Diffuse abdominal and pelvic ascites, mildly increased since previous study. Mesenteric and omental nodularity consistent with peritoneal carcinomatosis. Small periumbilical hernia containing fat. Edema throughout the subcutaneous fat. Musculoskeletal: Focal sacral bone lesion likely representing metastasis. Review of the MIP images confirms the above findings. IMPRESSION: 1. No evidence of significant pulmonary embolus. 2. Bilateral pleural effusions with basilar atelectasis and consolidation, greater on the right, and increasing since previous study. 3.  Right upper lung nodule, likely metastatic, no change. 4. Multiple diffuse liver metastases, no change. 5. Diffuse abdominal ascites and mesenteric/omental nodularity consistent with peritoneal carcinomatosis. Mildly increased ascites. 6. Focal sacral bone lesion as before. 7. 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  No change. Electronically Signed   By: Lucienne Capers M.D.   On: 12/28/2017 23:13   US Abdomen Limited  Result Date: 12/28/2017 CLINICAL DATA:  Metastatic cervical cancer. Evaluate for ascites and potential paracentesis. EXAM: LIMITED ABDOMEN ULTRASOUND FOR ASCITES TECHNIQUE: Limited ultrasound survey for ascites was performed in all four abdominal quadrants. COMPARISON:  CT 12/23/2017 FINDINGS: Heterogeneity in the liver compatible with known metastatic disease. Trace perihepatic fluid. Small amount of ascites in the right lower quadrant, left upper quadrant and left lower quadrant. IMPRESSION: Small ascites volume.  Paracentesis not performed. Electronically Signed   By: Markus Daft M.D.   On: 12/28/2017 17:11     Scheduled Meds: . sodium chloride   Intravenous Once  . citalopram  10 mg Oral Daily  . dexamethasone  8 mg Intravenous Daily  . flecainide  50 mg Oral Daily  . metoCLOPramide (REGLAN) injection  10 mg Intravenous Q12H  . OLANZapine zydis  5 mg Oral QHS  . pantoprazole  40 mg Oral Daily  . protein supplement shake  11 oz Oral BID BM   Continuous Infusions: . sodium chloride    . piperacillin-tazobactam (ZOSYN)  IV 3.375 g (12/29/17 1723)    LOS: 4 days   Kerney Elbe, DO [facility] PAGER is on AMION  If 7PM-7AM, please contact night-coverage [email or web address] Password [email or web address] 12/29/2017, 8:00 PM

## 2017-12-29 NOTE — Progress Notes (Addendum)
Extensive conversation with [name] this evening along with Dr. Truddie Coco. Her prognosis is very poor at this point and she continues to decline. We discussed hospice care -both home and hospice facility level care.Patient and family grieving.Goals are full comfort at this point. Will help with hospice arrangements tomorrow.   Lane Hacker, DO Palliative Medicine

## 2017-12-29 NOTE — Progress Notes (Signed)
CRITICAL VALUE ALERT  Critical Value: PLT:18  Date & Time Notied:  12/29/2017 @ 0530  Provider Notified: Lamar Blinks, NP notified via text page.  Orders Received/Actions taken: awaiting response.

## 2017-12-29 NOTE — Progress Notes (Addendum)
Extensive conversation with Cindy Faulkner and her family this evening along with Dr. Truddie Coco at bedside. Her prognosis is very poor at this point and she continues to decline. We continue to chase her "numbers" and she would not be able to tolerate any additional chemotherapy. She has severe electrolyte abnormalities, a profound coagulopathy and appears to now be developing shortness of breath and chest pain. Her nausea has improved and she is eating much better even in the setting of this illness. I discussed not missing an opportunity for her to go home and spend this time with her family and live each day the best she can in these circumstances. While there is prognostic uncertainty I shared with them I thought at best she only had a few weeks and that she is also fragile and may decline or die at anytime. We discussed hospice care -both home and hospice facility level care.Patient and family grieving.Goals are full comfort at this point. Will help with hospice arrangements tomorrow. Family requested a repeat CT for confirmation of disease progression and prognostication  Lane Hacker, DO Palliative Medicine

## 2017-12-30 ENCOUNTER — Inpatient Hospital Stay: Payer: 59 | Attending: Hematology and Oncology

## 2017-12-30 ENCOUNTER — Inpatient Hospital Stay: Payer: 59

## 2017-12-30 LAB — STOOL CULTURE: E coli, Shiga toxin Assay: NEGATIVE

## 2017-12-30 LAB — CBC
HEMATOCRIT: 27.6 % — AB (ref 36.0–46.0)
HEMOGLOBIN: 8.2 g/dL — AB (ref 12.0–15.0)
MCH: 28.4 pg (ref 26.0–34.0)
MCHC: 29.7 g/dL — ABNORMAL LOW (ref 30.0–36.0)
MCV: 95.5 fL (ref 80.0–100.0)
Platelets: 14 10*3/uL — CL (ref 150–400)
RBC: 2.89 MIL/uL — ABNORMAL LOW (ref 3.87–5.11)
RDW: 19.1 % — ABNORMAL HIGH (ref 11.5–15.5)
WBC: 22 10*3/uL — AB (ref 4.0–10.5)
nRBC: 0 % (ref 0.0–0.2)

## 2017-12-30 LAB — CULTURE, BLOOD (ROUTINE X 2)
CULTURE: NO GROWTH
Culture: NO GROWTH
SPECIAL REQUESTS: ADEQUATE
Special Requests: ADEQUATE

## 2017-12-30 LAB — STOOL CULTURE REFLEX - CMPCXR

## 2017-12-30 LAB — STOOL CULTURE REFLEX - RSASHR

## 2017-12-30 MED ORDER — OXYCODONE HCL 20 MG/ML PO CONC
10.0000 mg | ORAL | Status: DC | PRN
  Administered 2017-12-30: 10 mg via SUBLINGUAL
  Filled 2017-12-30: qty 1

## 2017-12-30 MED ORDER — MAGIC MOUTHWASH W/LIDOCAINE
15.0000 mL | Freq: Four times a day (QID) | ORAL | Status: DC | PRN
  Administered 2017-12-30: 15 mL via ORAL
  Filled 2017-12-30 (×2): qty 15

## 2017-12-30 MED ORDER — AMOXICILLIN-POT CLAVULANATE 500-125 MG PO TABS
1.0000 | ORAL_TABLET | Freq: Two times a day (BID) | ORAL | Status: DC
  Administered 2017-12-31 (×2): 500 mg via ORAL
  Filled 2017-12-30 (×4): qty 1

## 2017-12-30 MED ORDER — METOCLOPRAMIDE HCL 5 MG PO TABS
5.0000 mg | ORAL_TABLET | Freq: Three times a day (TID) | ORAL | Status: DC
  Administered 2017-12-30 – 2018-01-01 (×5): 5 mg via ORAL
  Filled 2017-12-30 (×5): qty 1

## 2017-12-30 MED ORDER — FUROSEMIDE 10 MG/ML IJ SOLN
40.0000 mg | Freq: Once | INTRAMUSCULAR | Status: AC
  Administered 2017-12-30: 40 mg via INTRAVENOUS
  Filled 2017-12-30: qty 4

## 2017-12-30 MED ORDER — MAGIC MOUTHWASH W/LIDOCAINE
15.0000 mL | Freq: Three times a day (TID) | ORAL | Status: DC | PRN
  Filled 2017-12-30: qty 15

## 2017-12-30 MED ORDER — FLUCONAZOLE 100 MG PO TABS
100.0000 mg | ORAL_TABLET | Freq: Every day | ORAL | Status: DC
  Administered 2017-12-30 – 2017-12-31 (×2): 100 mg via ORAL
  Filled 2017-12-30 (×4): qty 1

## 2017-12-30 MED ORDER — FENTANYL 12 MCG/HR TD PT72
25.0000 ug | MEDICATED_PATCH | TRANSDERMAL | Status: DC
  Administered 2017-12-30 – 2018-01-05 (×3): 25 ug via TRANSDERMAL
  Filled 2017-12-30 (×4): qty 2

## 2017-12-30 MED ORDER — DEXAMETHASONE 4 MG PO TABS
8.0000 mg | ORAL_TABLET | Freq: Every day | ORAL | Status: DC
  Administered 2018-01-01: 8 mg via ORAL
  Filled 2017-12-30 (×2): qty 2

## 2017-12-30 NOTE — Progress Notes (Signed)
Pharmacy Antibiotic Note  Cindy Faulkner is a 52 y.o. female admitted on 12/24/2017 with sepsis.  Pharmacy has been consulted for piperacillin/tazobactam dosing.  Pt has PMH significant for metastatic squamous cell carcinoma - no chemotherapy since May 2019. Broad spectrum antibiotics started on admission for sepsis of unknown origin.   Today, 12/30/17  WBC 18.8 (on dexamethasone)  Afebrile  SCr slightly increased, stable  Plan:  Continue Zosyn 3.375 g EI q 8h  F/U plans for abx  Follow renal function, clinical course, culture data    Height: 5\' 7"  (170.2 cm) Weight: 220 lb (99.8 kg) IBW/kg (Calculated) : 61.6  Temp (24hrs), Avg:97.7 F (36.5 C), Min:97.7 F (36.5 C), Max:97.7 F (36.5 C)  Recent Labs  Lab 12/25/17 0219 12/25/17 0619 12/26/17 0636 12/26/17 1654 12/27/17 0509 12/28/17 0331 12/29/17 0344  WBC  --  16.2* 20.4*  --  10.1 16.4* 18.8*  CREATININE  --  0.50 0.94  --  1.08* 1.06* 1.10*  LATICACIDVEN 1.8 1.7  --   --   --   --   --   VANCORANDOM  --   --   --  16 18  --   --     Estimated Creatinine Clearance: 71 mL/min (A) (by C-G formula based on SCr of 1.1 mg/dL (H)).    No Known Allergies  Antimicrobials this admission: 11/3 piperaicillin/tazobactam >>  11/3 vancomycin >>   Dose adjustments this admission: 11/4 d/c vancomycin standing order for significantly increased SCr 11/4 @ 16:54 = 16 mcg/ml : Vanc 750mg  IV x 1 dose 11/5 @ 05:00 = 18 mcg/ml: vancomycin 750 iv x 1 11/5 resume vancomycin 750 mg iv q 12h  Microbiology results: 11/3 BCx: NGTD 11/3 UCx: NG (final) 11/3 C diff: negative 11/3 stool: sent  Thank you for allowing pharmacy to be a part of this patient's care.  Ulice Dash D, PharmD Clinical Pharmacist 12/30/2017 7:09 AM

## 2017-12-30 NOTE — Progress Notes (Signed)
Pt was lying in bed, awake and alert when I arrived. Her daughter Cindy Faulkner was bedside. She said she felt better today and talked about her ride outside to get fresh air. Pt's attitude is very positive and she seems to remain spiritually sound and trusting God for a miracle and having peace with His will. Cindy Faulkner was very attentive to her mother. Please page of any changes. I had prayer w/her and Cindy Faulkner and offered additional support whenever needed.  [place], MDiv   12/30/17 2000  Clinical Encounter Type  Visited With Patient and family together

## 2017-12-30 NOTE — Progress Notes (Signed)
PROGRESS NOTE    Cindy Faulkner  GLO:[id number] DOB: 23-Sep-1965 DOA: 12/24/2017 PCP: Lennie Odor, PA-C  Brief Narrative:  The patient is a 52 y.o.femalewith medical history significant ofmetastatic squamous cell carcinoma involving liver & sacrum with unknown primary site(stoped chemo in May),hypertension, hyperlipidemia, stroke, GERD, depression, atrial fibrillation on Xarelto, who presents with altered mental status.  According to the patient's husband, the patient has had a functional decline over the past 2 weeks, now having difficulty getting out of bed without assistance.  In the past 2 to 3 days prior to admission, the patient has had worsening oral intake and increasing lethargy.  The patient has had on and off low-grade temperatures for the past week with a T-max of 101.0 F approximately 3 days prior to this admission.  There has been no headaches, neck pain, chest pain, coughing, hemoptysis, diarrhea, dysuria, hematuria, hematochezia, melena.  Upon presentation, the patient was noted to have sodium 128 and WBC 24.1 and hemoglobin 8.0.  There is concern for infectious source.  Patient was started on empiric vancomycin and Zosyn. Pt admitted for further management.  Encephalopathy improved some but patient feels more SOB and distended. IVF discontinued and Paracentesis attempted but did not have enough fluid to tap. Palliative Care and Hematology/Oncology to re-evaluate and make further recommendations. After exentensive discussion with Hematology/Oncology and Palliative, Goals of Care are going to be shifted to Comfort Care. Palliative helping in the process of arranging Hospice and patient has elected Home Hospice.  At least 24 hours to make sure she can transition to p.o. medications.  Dr. Hilma Favors discussed with the patient and she will be treated for few more days with an oral regimen of antibiotics and switch to Augmentin.  She also has oral thrush and she will be treated with  fluconazole as well as Magic mouthwash.  Likely going to be transitioned home with home hospice in the next 24 to 48 hours  Assessment & Plan:   Principal Problem:   Acute metabolic encephalopathy Active Problems:   Essential hypertension   AF (paroxysmal atrial fibrillation) (HCC)   Cerebral thrombosis with cerebral infarction   Stroke (cerebrum) (HCC)   Metastasis to liver (HCC)   Metastatic squamous cell carcinoma involving liver & sacrum with unknown primary site    Hypokalemia   Anemia due to antineoplastic chemotherapy   MDD (major depressive disorder), recurrent severe, without psychosis ([place])   Severe protein-calorie malnutrition (HCC)   Sepsis (HCC)   Hyponatremia   Thrombocytopenia (HCC)  Acute metabolic encephalopathy likely 2/2 worsening metastatic CA with possible sepsis of unknown origin; improved  -Multifactorial including infectious etiology, hyponatremia, dehydration, pancytopenia, cancer of unknown origin ?Cervical Ca as primary -Currently afebrile, with improved leukocytosis -Urinalysis negative for pyuria, UC with no growth -Stool culture for C.diff negative -BC X 2, NGTD at 4 days  -Chest x-ray personally reviewed--no consolidations -Personally reviewed EKG--sinus rhythm, no ST-T wave changes -CT head negative for any mets -Continued empiric IV Zosyn but now changed to po Augmentin, d/c IV Vancomycin as no MRSA growing -Palliative consulted due to very poor prognosis, made DNR -Appreciate further evaluation and recommendations by Palliative; she is being transitioned home with home hospice and will likely be discharged in the next 24 to 48 hours -Family requested Repeat CT Scan for confirmation of disease and prognostication and was done last night -Showed Metastatic Disease   Coagulopathy/Thrombocytopenia -No signs of active bleeding -Likely due to sepsis resulting in ??DIC from Cancer -Presenting INR 3.66, PTT 51,  fibrinogen 226, DAT negative -D-dimer  14.95 -S/P transfused 2 units PRBC, s/p 1U of platelet 12/27/17 -Hematology/oncology on board and to re-evaluate today -Platelet Count now 31 and  but Hb/Hct dropped to 8.2/27.6 -Will not check Bloodwork now that patient is transitioning to comfort and going to go home with Hospice   Intractable nausea/vomiting/abdominal distension/fullness -12/23/2017 CT abdomen and pelvis--interval progression of malignancy with progression of liver metastasis; new abdominal ascites with loculated fluid over the liver and peritoneal nodule in the right upper quadrant; new right upper lobe and right lower lobe lung nodule, multiple splenic and renal infarcts -Scheduled reglan, zyprexa for nausea, decadron as per palliative and Decadron dose has been reduced and is now on 8 mg po Daily -Palliative recommending therapeutic/comfort paracentesis. IR consulted, unable to do 12/27/17 due to coagulopathy and unable to do today as not enough fluid -Will try IV Lasix 40 mg x1 again today  -Palliative started pt on IV albumin until after paracentesis -Pain management with fentanyl changed to Dilaudid and adde po Oxycodone but changed again by Palliative in hopes of transitioning home; She is now on p.o. oxycodone 10 mg sublingually every 1 hour as needed for breakthrough pain and with IV fentanyl 12.5 to 25 mcg IV q. one hour PRN for moderate pain -Palliative is also added a fentanyl patch 25 mcg transdermally every 72 hours -Palliative following and appreciate GOC discussion   Cancer of unknown origin likely ?cervical CA -Follows Dr. Alvy Bimler -Has mets to liver and sacrum which have worsened  -last Keytruda 2-3 weeks prior to admission -Holding off all chemo for now and her conditioning is such that she may not tolerate even salvage Chemo -Repeat CT Scan done at family request last night to show disease progression and prognostication -Palliative and Oncology working with patient and family and GOC is transitioning  towards comfort care -Palliative helping arrange Hospice as Patient is going to transition home with Home Hospice   Paroxysmal atrial fibrillation -Holding Rivaroxaban secondary to thrombocytopenia -Continue Flecainide 50 mg po Daily for now  -Presently in sinus rhythm  Hypokalemia/Hypomagnesemia -Patient's potassium yesterday was 3.6 and patient's magnesium was 1.9 -Continue monitor and replete as necessary -Will not Repeat CMP as Goals are transitioning toward comfort  Hyponatremia -due to poor oral intake and dehydration, improved -Discontinued IVF due to Volume Overload and was given 1x of IV Lasix a few days ago and will give another dose today  -Na+ yesterday was 136 -Will not Repeat CMP as Goals are transitioning toward comfort  Anemia due to antineoplastic chemotherapy -This is likely anemia of chronic disease -Serum Y10 elevated, folic acid level low -Iron 25, sats 23, ferritin 4,414 -Hb/Hct now stable at 8.2/27.6 -Continue to Monitor for S/Sx of Bleeding  -Will not Repeat CBC as Goals are transitioning toward comfort  MDD (major depressive disorder), recurrent severe, without psychosis ([place]): -Continue Citalopram 20 mg po Daily  -C/w Olanzapine 2.5 mg po qHS  Severe Protein-Calorie Malnutrition  -Appreciate Nutritionist Recc's -C/w Protein Supplement Liquid 11 ounces BID   Leukocytosis -The setting of IV steroid demargination with Decadron use; WBC trended up to 22.0 -Now on 8 mg of po Daily -Continue monitor for signs and symptoms of infection -Continued with empiric antibiotics with IV Zosyn and changed to po Augmentin   DVT prophylaxis: SCDs due to Thrombocytopenia Code Status: DO NOT RESUSCITATE Family Communication: Discussed with Daughter and Grand-Daughter at bedside  Disposition Plan: Likely Home with Hospice in the   Consultants:  Palliative Care  Hematology Hospice   Procedures:  Attempted Paracentesis    Antimicrobials:    Anti-infectives (From admission, onward)   Start     Dose/Rate Route Frequency Ordered Stop   12/30/17 2200  amoxicillin-clavulanate (AUGMENTIN) 500-125 MG per tablet 500 mg     1 tablet Oral Every 12 hours 12/30/17 1232     12/30/17 1300  fluconazole (DIFLUCAN) tablet 100 mg     100 mg Oral Daily 12/30/17 1229     12/27/17 1900  vancomycin (VANCOCIN) IVPB 750 mg/150 ml premix  Status:  Discontinued     750 mg 150 mL/hr over 60 Minutes Intravenous Every 12 hours 12/27/17 1043 12/27/17 1416   12/27/17 0630  vancomycin (VANCOCIN) IVPB 750 mg/150 ml premix     750 mg 150 mL/hr over 60 Minutes Intravenous  Once 12/27/17 0624 12/27/17 0745   12/26/17 1800  vancomycin (VANCOCIN) IVPB 750 mg/150 ml premix  Status:  Discontinued     750 mg 150 mL/hr over 60 Minutes Intravenous Every 12 hours 12/26/17 1737 12/26/17 1741   12/26/17 1800  vancomycin (VANCOCIN) IVPB 750 mg/150 ml premix     750 mg 150 mL/hr over 60 Minutes Intravenous  Once 12/26/17 1742 12/26/17 1906   12/26/17 1800  vancomycin variable dose per unstable renal function (pharmacist dosing)  Status:  Discontinued      Does not apply See admin instructions 12/26/17 1747 12/27/17 1043   12/26/17 0852  vancomycin variable dose per unstable renal function (pharmacist dosing)  Status:  Discontinued      Does not apply See admin instructions 12/26/17 0853 12/26/17 1737   12/25/17 1600  vancomycin (VANCOCIN) IVPB 750 mg/150 ml premix  Status:  Discontinued     750 mg 150 mL/hr over 60 Minutes Intravenous Every 12 hours 12/25/17 0824 12/26/17 0853   12/25/17 1200  vancomycin (VANCOCIN) IVPB 750 mg/150 ml premix  Status:  Discontinued     750 mg 150 mL/hr over 60 Minutes Intravenous Every 8 hours 12/25/17 0358 12/25/17 0824   12/25/17 1000  piperacillin-tazobactam (ZOSYN) IVPB 3.375 g  Status:  Discontinued     3.375 g 12.5 mL/hr over 240 Minutes Intravenous Every 8 hours 12/25/17 0349 12/30/17 1232   12/25/17 0215  vancomycin (VANCOCIN)  2,000 mg in sodium chloride 0.9 % 500 mL IVPB     2,000 mg 250 mL/hr over 120 Minutes Intravenous  Once 12/25/17 0154 12/25/17 0504   12/25/17 0200  piperacillin-tazobactam (ZOSYN) IVPB 3.375 g     3.375 g 100 mL/hr over 30 Minutes Intravenous NOW 12/25/17 0154 12/25/17 0333     Subjective: Seen and examined at bedside and family thinks she is more alert today after not receiving the oxycodone or Dilaudid.  Patient was still complaining of some lower abdominal crampy pain.  Denies chest pain, lightheadedness or dizziness.  Has has come to the decision of going home with hospice and this will likely set up the next few days.  Patient feels at peace with this decision still request to be treated with p.o. antibiotics and this is been changed to p.o. Augmentin  Objective: Vitals:   12/28/17 1520 12/28/17 2014 12/29/17 0411 12/29/17 2200  BP: 138/84 (!) 138/92 (!) 147/85 130/87  Pulse: 82 85 97 83  Resp: 16 20 20    Temp: (!) 97.5 F (36.4 C)  97.7 F (36.5 C) 97.7 F (36.5 C)  TempSrc: Oral  Oral   SpO2: 98% 100% 100%   Weight:      Height:  Intake/Output Summary (Last 24 hours) at 12/30/2017 1706 Last data filed at 12/30/2017 1426 Gross per 24 hour  Intake 0 ml  Output 400 ml  Net -400 ml   Filed Weights   12/24/17 2104  Weight: 99.8 kg   Examination: Physical Exam:  Constitutional: Alert, well-developed obese Caucasian female currently appears slightly uncomfortable but does appear calm Eyes: Lids and conjunctive are normal.  Sclera anicteric ENMT: Sternal ears nose appear normal.  Grossly normal hearing Neck: Appears supple no JVD Respiratory: Diminished to auscultation bilaterally.  With no appreciable wheezing, rales, rhonchi.  Patient had unlabored breathing Cardiovascular: Regular rate and rhythm.  No appreciable murmurs, rubs, gallops 1+ lower extremity edema noted Abdomen: Soft, tender to palpate the lower quadrant.  Slightly distended.  Bowel sounds  present GU: Deferred Musculoskeletal: No contractures or cyanosis.  No joint swelling noted Skin: No appreciable rashes or lesions on limited skin evaluation Neurologic: Cranial nerves II through XII grossly intact no appreciable focal deficits Psychiatric: Positive mood and affect.  Intact judgment insight.  Patient is awake and alert  Data Reviewed: I have personally reviewed following labs and imaging studies  CBC: Recent Labs  Lab 12/25/17 0619  12/26/17 0636 12/27/17 0509 12/28/17 0331 12/29/17 0344 12/30/17 0825  WBC 16.2*  --  20.4* 10.1 16.4* 18.8* 22.0*  NEUTROABS 14.1*  --  17.4* 8.7* 14.0* 16.0*  --   HGB 6.9*  --  8.9* 8.6* 7.8* 8.2* 8.2*  HCT 23.3*  --  28.8* 28.9* 26.3* 27.4* 27.6*  MCV 90.0  --  90.0 91.2 92.6 94.2 95.5  PLT 30*   < > 28* 19* 31* 18* 14*   < > = values in this interval not displayed.   Basic Metabolic Panel: Recent Labs  Lab 12/25/17 0245 12/25/17 0619 12/26/17 0636 12/27/17 0509 12/28/17 0331 12/29/17 0344  NA  --  130* 130* 132* 136 136  K  --  3.3* 3.7 4.0 3.7 3.6  CL  --  98 100 100 106 103  CO2  --  24 23 22  19* 21*  GLUCOSE  --  145* 169* 197* 166* 132*  BUN  --  9 11 12 14 14   CREATININE  --  0.50 0.94 1.08* 1.06* 1.10*  CALCIUM  --  7.7* 7.8* 8.1* 8.4* 9.0  MG 1.3*  --  1.6* 1.0* 2.6* 1.9  PHOS  --   --  2.3* 3.1 2.9 3.2   GFR: Estimated Creatinine Clearance: 71 mL/min (A) (by C-G formula based on SCr of 1.1 mg/dL (H)). Liver Function Tests: Recent Labs  Lab 12/24/17 2141 12/25/17 0619 12/26/17 0636 12/27/17 0509 12/28/17 0331 12/29/17 0344  AST 47* 39 38  --   --  40  ALT 24 20 23   --   --  21  ALKPHOS 168* 138* 154*  --   --  231*  BILITOT 1.4* 1.4* 1.6*  --   --  1.4*  PROT 5.8* 5.0* 5.1*  --   --  5.9*  ALBUMIN 2.0* 1.7* 1.8* 2.3* 2.5* 3.1*   Recent Labs  Lab 12/24/17 2141  LIPASE 18   Recent Labs  Lab 12/25/17 1215  AMMONIA 19   Coagulation Profile: Recent Labs  Lab 12/25/17 0047 12/25/17 1215  12/27/17 0509 12/28/17 1111  INR 3.66 3.10 2.89 2.27   Cardiac Enzymes: No results for input(s): CKTOTAL, CKMB, CKMBINDEX, TROPONINI in the last 168 hours. BNP (last 3 results) No results for input(s): PROBNP in the last 8760 hours.  HbA1C: No results for input(s): HGBA1C in the last 72 hours. CBG: Recent Labs  Lab 12/28/17 0736 12/28/17 1150 12/28/17 1647 12/28/17 2104 12/29/17 0735  GLUCAP 115* 169* 166* 149* 120*   Lipid Profile: No results for input(s): CHOL, HDL, LDLCALC, TRIG, CHOLHDL, LDLDIRECT in the last 72 hours. Thyroid Function Tests: No results for input(s): TSH, T4TOTAL, FREET4, T3FREE, THYROIDAB in the last 72 hours. Anemia Panel: No results for input(s): VITAMINB12, FOLATE, FERRITIN, TIBC, IRON, RETICCTPCT in the last 72 hours. Sepsis Labs: Recent Labs  Lab 12/25/17 0219 12/25/17 0619  PROCALCITON 0.85  --   LATICACIDVEN 1.8 1.7    Recent Results (from the past 240 hour(s))  Culture, blood (Routine X 2) w Reflex to ID Panel     Status: None   Collection Time: 12/25/17  2:19 AM  Result Value Ref Range Status   Specimen Description   Final    BLOOD RIGHT PORTA CATH Performed at [facility] [address], [place], [place] [address]    Special Requests   Final    BOTTLES DRAWN AEROBIC AND ANAEROBIC Blood Culture adequate volume Performed at [facility] [address] [place], [place] [address]    Culture   Final    NO GROWTH 5 DAYS Performed at [facility], [place] [address] [place], [place] [address]    Report Status 12/30/2017 FINAL  Final  Culture, blood (Routine X 2) w Reflex to ID Panel     Status: None   Collection Time: 12/25/17  2:19 AM  Result Value Ref Range Status   Specimen Description   Final    BLOOD LEFT FOREARM Performed at [facility] [address], [place], [place] [address]    Special Requests   Final    BOTTLES DRAWN AEROBIC AND ANAEROBIC Blood Culture  adequate volume Performed at [facility] [address], [place], [place] [address]    Culture   Final    NO GROWTH 5 DAYS Performed at [facility], [place] [address] [place], [place] [address]    Report Status 12/30/2017 FINAL  Final  Urine Culture     Status: None   Collection Time: 12/25/17  6:30 AM  Result Value Ref Range Status   Specimen Description   Final    URINE, CLEAN CATCH Performed at [facility], [place] [address], [place], [place] [address]    Special Requests   Final    NONE Performed at [facility], [place] [address] [place], [place] [address]    Culture   Final    NO GROWTH Performed at [facility], [place] [address] [place], [place] [address]    Report Status 12/26/2017 FINAL  Final  C difficile quick scan w PCR reflex     Status: None   Collection Time: 12/25/17  3:47 PM  Result Value Ref Range Status   C Diff antigen NEGATIVE NEGATIVE Final   C Diff toxin NEGATIVE NEGATIVE Final   C Diff interpretation No C. difficile detected.  Final    Comment: Performed at [facility], [place] [address], [place], [place] [address]  Stool culture (children & immunocomp patients)     Status: None   Collection Time: 12/25/17  3:47 PM  Result Value Ref Range Status   Salmonella/Shigella Screen Final report  Final   Campylobacter Culture Final report  Final   E coli, Shiga toxin Assay  Negative Negative Final    Comment: (NOTE) Performed At: [facility] [place], [place] [id number] Rush Farmer MD [id number]   STOOL CULTURE REFLEX - RSASHR     Status: None   Collection Time: 12/25/17  3:47 PM  Result Value Ref Range Status   Stool Culture result 1 (RSASHR) Comment  Final    Comment: (NOTE) No Salmonella or Shigella recovered. Performed At: [facility] [address] [place] [address] [place], [place] [id number] Rush Farmer MD YW:[id number]   STOOL CULTURE  Reflex - CMPCXR     Status: None   Collection Time: 12/25/17  3:47 PM  Result Value Ref Range Status   Stool Culture result 1 (CMPCXR) Comment  Final    Comment: (NOTE) No Campylobacter species isolated. Performed At: [facility] [place], [place] [id number] Rush Farmer MD KK:[id number]     Radiology Studies: Ct Angio Chest Pe W Or Wo Contrast  Result Date: 12/28/2017 CLINICAL DATA:  Metastatic squamous cell carcinoma of unknown primary. Presenting with altered mental status, functional decline over 2 weeks, fevers. Increasing lethargy. EXAM: CT ANGIOGRAPHY CHEST CT ABDOMEN AND PELVIS WITH CONTRAST TECHNIQUE: Multidetector CT imaging of the chest was performed using the standard protocol during bolus administration of intravenous contrast. Multiplanar CT image reconstructions and MIPs were obtained to evaluate the vascular anatomy. Multidetector CT imaging of the abdomen and pelvis was performed using the standard protocol during bolus administration of intravenous contrast. CONTRAST:  166mL ISOVUE-370 IOPAMIDOL (ISOVUE-370) INJECTION 76%, 101mL OMNIPAQUE IOHEXOL 300 MG/ML SOLN COMPARISON:  CT chest abdomen and pelvis 12/23/2017 FINDINGS: CTA CHEST FINDINGS Cardiovascular: Moderately good opacification of the central and segmental pulmonary arteries. No focal filling defects. No evidence of significant pulmonary embolus. Normal caliber thoracic aorta. Great vessel origins are patent. Normal heart size. No pericardial effusion. Mediastinum/Nodes: Right central venous catheter with tip in the low SVC region. Esophagus is decompressed. No significant lymphadenopathy in the chest. Lungs/Pleura: Bilateral pleural effusions, larger on the right and increasing since previous study. Bilateral basilar consolidation or atelectasis, also greater on the right and also increasing. Possible superimposed pneumonia. Right upper lung nodule again demonstrated measuring 9 mm diameter. Evaluation  of lungs is limited due to motion artifact. Airways are patent. No pneumothorax. Musculoskeletal: No destructive bone lesions in the chest. Review of the MIP images confirms the above findings. CT ABDOMEN and PELVIS FINDINGS Hepatobiliary: Multiple renal hand sing liver lesions demonstrated throughout the liver consistent with multiple hepatic metastasis. No significant change. Gallbladder is contracted. Gallbladder wall edema is likely related to liver disease. No bile duct dilatation. Pancreas: Unremarkable. No pancreatic ductal dilatation or surrounding inflammatory changes. Spleen: Wedge-shaped perfusion defects in the spleen likely represent splenic infarcts. No change. Adrenals/Urinary Tract: No adrenal gland nodules. Mild renal parenchymal scarring. Nonobstructing stone in the midpole right kidney. Heterogeneous left nephrogram with focal perfusion defect likely representing infarct. No change. Bladder is unremarkable. Stomach/Bowel: Stomach, small bowel, and colon are not abnormally distended. No wall thickening is appreciated. Appendix is not identified. Vascular/Lymphatic: Scattered aortic calcification. No aortic aneurysm. Scattered retroperitoneal lymph nodes are not pathologically enlarged. Reproductive: Uterus and ovaries are not enlarged. Other: Diffuse abdominal and pelvic ascites, mildly increased since previous study. Mesenteric and omental nodularity consistent with peritoneal carcinomatosis. Small periumbilical hernia containing fat. Edema throughout the subcutaneous fat. Musculoskeletal: Focal sacral bone lesion likely representing metastasis. Review of the MIP images confirms the above findings. IMPRESSION: 1. No evidence of significant pulmonary embolus. 2. Bilateral pleural effusions with basilar  atelectasis and consolidation, greater on the right, and increasing since previous study. 3. Right upper lung nodule, likely metastatic, no change. 4. Multiple diffuse liver metastases, no change. 5.  Diffuse abdominal ascites and mesenteric/omental nodularity consistent with peritoneal carcinomatosis. Mildly increased ascites. 6. Focal sacral bone lesion as before. 7. 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  No change. Electronically Signed   By: Lucienne Capers M.D.   On: 12/28/2017 23:13   Ct Abdomen Pelvis W Contrast  Result Date: 12/28/2017 CLINICAL DATA:  Metastatic squamous cell carcinoma of unknown primary. Presenting with altered mental status, functional decline over 2 weeks, fevers. Increasing lethargy. EXAM: CT ANGIOGRAPHY CHEST CT ABDOMEN AND PELVIS WITH CONTRAST TECHNIQUE: Multidetector CT imaging of the chest was performed using the standard protocol during bolus administration of intravenous contrast. Multiplanar CT image reconstructions and MIPs were obtained to evaluate the vascular anatomy. Multidetector CT imaging of the abdomen and pelvis was performed using the standard protocol during bolus administration of intravenous contrast. CONTRAST:  120mL ISOVUE-370 IOPAMIDOL (ISOVUE-370) INJECTION 76%, 76mL OMNIPAQUE IOHEXOL 300 MG/ML SOLN COMPARISON:  CT chest abdomen and pelvis 12/23/2017 FINDINGS: CTA CHEST FINDINGS Cardiovascular: Moderately good opacification of the central and segmental pulmonary arteries. No focal filling defects. No evidence of significant pulmonary embolus. Normal caliber thoracic aorta. Great vessel origins are patent. Normal heart size. No pericardial effusion. Mediastinum/Nodes: Right central venous catheter with tip in the low SVC region. Esophagus is decompressed. No significant lymphadenopathy in the chest. Lungs/Pleura: Bilateral pleural effusions, larger on the right and increasing since previous study. Bilateral basilar consolidation or atelectasis, also greater on the right and also increasing. Possible superimposed pneumonia. Right upper lung nodule again demonstrated measuring 9 mm diameter. Evaluation of lungs is limited due to motion  artifact. Airways are patent. No pneumothorax. Musculoskeletal: No destructive bone lesions in the chest. Review of the MIP images confirms the above findings. CT ABDOMEN and PELVIS FINDINGS Hepatobiliary: Multiple renal hand sing liver lesions demonstrated throughout the liver consistent with multiple hepatic metastasis. No significant change. Gallbladder is contracted. Gallbladder wall edema is likely related to liver disease. No bile duct dilatation. Pancreas: Unremarkable. No pancreatic ductal dilatation or surrounding inflammatory changes. Spleen: Wedge-shaped perfusion defects in the spleen likely represent splenic infarcts. No change. Adrenals/Urinary Tract: No adrenal gland nodules. Mild renal parenchymal scarring. Nonobstructing stone in the midpole right kidney. Heterogeneous left nephrogram with focal perfusion defect likely representing infarct. No change. Bladder is unremarkable. Stomach/Bowel: Stomach, small bowel, and colon are not abnormally distended. No wall thickening is appreciated. Appendix is not identified. Vascular/Lymphatic: Scattered aortic calcification. No aortic aneurysm. Scattered retroperitoneal lymph nodes are not pathologically enlarged. Reproductive: Uterus and ovaries are not enlarged. Other: Diffuse abdominal and pelvic ascites, mildly increased since previous study. Mesenteric and omental nodularity consistent with peritoneal carcinomatosis. Small periumbilical hernia containing fat. Edema throughout the subcutaneous fat. Musculoskeletal: Focal sacral bone lesion likely representing metastasis. Review of the MIP images confirms the above findings. IMPRESSION: 1. No evidence of significant pulmonary embolus. 2. Bilateral pleural effusions with basilar atelectasis and consolidation, greater on the right, and increasing since previous study. 3. Right upper lung nodule, likely metastatic, no change. 4. Multiple diffuse liver metastases, no change. 5. Diffuse abdominal ascites and  mesenteric/omental nodularity consistent with peritoneal carcinomatosis. Mildly increased ascites. 6. Focal sacral bone lesion as before. 7. 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  No change. Electronically Signed   By: Lucienne Capers M.D.   On: 12/28/2017 23:13  Scheduled Meds: . sodium chloride   Intravenous Once  . amoxicillin-clavulanate  1 tablet Oral Q12H  . citalopram  10 mg Oral Daily  . dexamethasone  8 mg Oral Daily  . fentaNYL  25 mcg Transdermal Q72H  . flecainide  50 mg Oral Daily  . fluconazole  100 mg Oral Daily  . metoCLOPramide  5 mg Oral TID AC  . OLANZapine zydis  5 mg Oral QHS  . pantoprazole  40 mg Oral Daily  . protein supplement shake  11 oz Oral BID BM   Continuous Infusions: . sodium chloride      LOS: 5 days   Kerney Elbe, DO [facility] PAGER is on AMION  If 7PM-7AM, please contact night-coverage [email or web address] Password [email or web address] 12/30/2017, 5:06 PM

## 2017-12-30 NOTE — Progress Notes (Addendum)
Met with patient and family- will transition to duragesic patch and oxyfast SL for breakthrough pain. [name] wants to try to go home with hospice care at this point. Family in agreement. Discussed with CMRN and notified [place] liaison. Will need 24 hours to make sure we can transition her to a home regimen.  We discussed stopping IV antibiotics- I am ok with her being treated for a few more days with an oral regimen if it is contributing to her comfort and stability. Will switch to Augmentin.  She also has signs of oral thrush. Will treat with fluconazole and MMW.  Again provided support and reassurance at bedside.  Time: 35 min Greater than 50%  of this time was spent counseling and coordinating care related to the above assessment and plan.   Lane Hacker, DO Palliative Medicine

## 2017-12-30 NOTE — Progress Notes (Signed)
[facility]) - RN visit  Received request for possible interest in Hospice services for patient on 12/29/17. Several attempts made yesterday but unable to speak with patient. Chart and patient information under review for Hospice eligibility by [facility] physician. CMRN Suanne Marker confirmed patient's plan is to go home with Hospice upon discharge.  Checked in with bedside RN, then visited patient today with family at side. Spoke with patient to initiate education related to Hospice philosophy, services and team approach to care. Patient and family verbalized understanding of information given. Per discussion, patient verbalized that plan is transitioning off IV pain medication to PO medications then interested in Home with Hospice services. Discharge date unknown at this time. Patient given Hospice contact information and encouraged to call with further questions.  Please call with Hospice related questions,  Gar Ponto, RN [facility] Liaison [place] found on AMION

## 2017-12-30 NOTE — Progress Notes (Signed)
Pt has CPAP from home.  RT inspected machine for damages/defects, none were found.  Pt's machine working properly at this time.  RT to monitor and assess as needed.

## 2017-12-30 NOTE — Progress Notes (Signed)
[facility] [place] (late entry)  Received request to see patient and family for possible interest in home hospice and [facility]. Attempted to meet with family x3 throughout the day. Patient was sleeping and unable to participate at time of visits. Appreciate report from Dr. Hilma Favors and bedside RN. Family considering options based on how patient does. [facility] hospital team available as needed if services are requested.   Thank you,  Erling Conte, LCSW [phone]

## 2017-12-30 NOTE — Progress Notes (Signed)
   12/30/17 1400  Clinical Encounter Type  Visited With Patient and family together  Visit Type Follow-up;Psychological support;Spiritual support  Referral From Chaplain;Nurse;Family  Consult/Referral To Chaplain  Spiritual Encounters  Spiritual Needs Emotional;Other (Comment) (Spiritual Care Conversation/Support)  Stress Factors  Patient Stress Factors Health changes;Major life changes  Family Stress Factors Health changes;Major life changes   I visited with Cindy Faulkner per request to follow up with her from previous visits. 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 Tailor said that she is at peace with the decision to move towards hospice at home. She has good family support and wants to be in a place where they can visit easier than a hospice facility. Cindy Faulkner is also wanting to be in the comfort of her own home.  Cindy Faulkner remains optimistic through her faith, but is also realistic about her condition. She said that she was feeling much better today than yesterday and that she was glad to be able to talk with Leretha Pol from the Palliative Care team.  I will continue to follow-up.   Please, contact Spiritual Care for further assistance.   Chaplain Shanon Ace M.Div., [facility]

## 2017-12-30 NOTE — Progress Notes (Signed)
Met with [name] and her family, family concerned she was "overmedicated", positive for pinpoint pupils, but was able to stay awake for conversation, and was able to communicate clearly. Family trying to get some affairs in order today with attorney and she has had lots of visitors. At this time they cannot make a decision about hospice care or disposition-they are concerned she is declining rapidly and do not want her moved from the hospital-the place where she has so many people who love and care about her. [name] showed me her 30 year service pin that was presented to her today.  Will see how things go over the next 24-48 hours and readdress disposition and goals for her EOL care. Given her liver involvement I discontinued the hydromorphone and oxycodone and will use Fentanyl prn and then convert to a patch based on her usage of IV medication.  Lane Hacker, DO Palliative Medicine  Time 35 min Greater than 50%  of this time was spent counseling and coordinating care related to the above assessment and plan.

## 2017-12-31 DIAGNOSIS — Z515 Encounter for palliative care: Secondary | ICD-10-CM

## 2017-12-31 DIAGNOSIS — Z7189 Other specified counseling: Secondary | ICD-10-CM

## 2017-12-31 LAB — COMPREHENSIVE METABOLIC PANEL
ALK PHOS: 188 U/L — AB (ref 38–126)
ALT: 23 U/L (ref 0–44)
AST: 40 U/L (ref 15–41)
Albumin: 2.7 g/dL — ABNORMAL LOW (ref 3.5–5.0)
Anion gap: 12 (ref 5–15)
BILIRUBIN TOTAL: 1.1 mg/dL (ref 0.3–1.2)
BUN: 22 mg/dL — ABNORMAL HIGH (ref 6–20)
CALCIUM: 8.7 mg/dL — AB (ref 8.9–10.3)
CO2: 20 mmol/L — ABNORMAL LOW (ref 22–32)
Chloride: 102 mmol/L (ref 98–111)
Creatinine, Ser: 1.39 mg/dL — ABNORMAL HIGH (ref 0.44–1.00)
GFR, EST AFRICAN AMERICAN: 49 mL/min — AB (ref >60)
GFR, EST NON AFRICAN AMERICAN: 42 mL/min — AB (ref >60)
Glucose, Bld: 171 mg/dL — ABNORMAL HIGH (ref 70–99)
Potassium: 3.5 mmol/L (ref 3.5–5.1)
Sodium: 134 mmol/L — ABNORMAL LOW (ref 135–145)
TOTAL PROTEIN: 5.6 g/dL — AB (ref 6.5–8.1)

## 2017-12-31 LAB — CBC WITH DIFFERENTIAL/PLATELET
Abs Immature Granulocytes: 0.19 10*3/uL — ABNORMAL HIGH (ref 0.00–0.07)
BASOS ABS: 0 10*3/uL (ref 0.0–0.1)
Basophils Relative: 0 %
EOS ABS: 0 10*3/uL (ref 0.0–0.5)
EOS PCT: 0 %
HEMATOCRIT: 27.5 % — AB (ref 36.0–46.0)
Hemoglobin: 8.1 g/dL — ABNORMAL LOW (ref 12.0–15.0)
Immature Granulocytes: 1 %
Lymphocytes Relative: 7 %
Lymphs Abs: 1.5 10*3/uL (ref 0.7–4.0)
MCH: 27.6 pg (ref 26.0–34.0)
MCHC: 29.5 g/dL — ABNORMAL LOW (ref 30.0–36.0)
MCV: 93.5 fL (ref 80.0–100.0)
MONO ABS: 0.8 10*3/uL (ref 0.1–1.0)
Monocytes Relative: 4 %
NRBC: 0 % (ref 0.0–0.2)
Neutro Abs: 18.7 10*3/uL — ABNORMAL HIGH (ref 1.7–7.7)
Neutrophils Relative %: 88 %
Platelets: 18 10*3/uL — CL (ref 150–400)
RBC: 2.94 MIL/uL — AB (ref 3.87–5.11)
RDW: 19 % — AB (ref 11.5–15.5)
WBC: 21.2 10*3/uL — AB (ref 4.0–10.5)

## 2017-12-31 LAB — GLUCOSE, CAPILLARY
GLUCOSE-CAPILLARY: 148 mg/dL — AB (ref 70–99)
Glucose-Capillary: 182 mg/dL — ABNORMAL HIGH (ref 70–99)

## 2017-12-31 MED ORDER — FUROSEMIDE 40 MG PO TABS
40.0000 mg | ORAL_TABLET | Freq: Every day | ORAL | Status: DC
  Administered 2017-12-31 – 2018-01-01 (×2): 40 mg via ORAL
  Filled 2017-12-31 (×2): qty 1

## 2017-12-31 NOTE — Progress Notes (Signed)
Pt was lying in bed awake and alert when I arrived. Her husband was bedside. He talked about how her numbers were better today and he was happy for that and indicated that shows signs of hope. Pt was a part of the conversation during the visit. She talked about her sleep over w/her daughter last night and that [name] (her daughter) plans to spend the night again tonight. Pt's husband was supportive and attentive to pt. He talked about how they have been married for 10 years and were meant for each other. Pt's husband remains hopeful of a miracle of healing. We had prayer bedside w/pt. They were grateful for the visit and prayer. Please page if additional support is needed prior to the next visit. [place], [place]   12/31/17 1900  Clinical Encounter Type  Visited With Patient and family together

## 2017-12-31 NOTE — Progress Notes (Signed)
Palliative care progress note  Met with Cindy Faulkner, her husband, daughter, and mother.  She reports that she is having a pretty good day today.  Requests labwork and we discussed ordering labs every couple of days per her desire to "know what is going on."  Discussed actionable things with this (replace platelets, electrolytes, etc) and how these are things that may be helpful in short term, but overall would not change her underlying disease progression.  Was transitioned to duragesic patch and oxyfast SL for breakthrough pain.  Doing well on this regimen and will continue same.   Cindy Faulkner wants to try to go home with hospice care at this point but feels need to ensure that things are stable before doing so. Family in agreement and we discussed plan to transition home at beginning of the week once being sure of a stable regimen that can be duplicated at home. Discussed with [facility] liaison who will meet with family on Monday per family request.  Continue Augmentin and fluconazole.  Provided support and reassurance at bedside.  Time: 35 min Greater than 50%  of this time was spent counseling and coordinating care related to the above assessment and plan.  Micheline Rough, MD [place] Team [phone]

## 2017-12-31 NOTE — Progress Notes (Signed)
[facility] -- Hospice and [facility]) RN note at [place] by Dr. Micheline Rough patient desires no follow up this weekend regarding hospice services. She desires to see how the weekend goes and see Monday where things are with possible plan for working toward discharge to home Tuesday.   Respecting patient wishes, no visits to be made this weekend.  Please feel free to call with any hospice related questions or concerns.  Thank you, Margaretmary Eddy, RN, [facility] Liaison [phone]  [place] are on AMION.

## 2017-12-31 NOTE — Progress Notes (Signed)
Pt has been asleep since 2200 with her daughter in the bed with her. Pt was not awakened for 2200 meds

## 2017-12-31 NOTE — Progress Notes (Signed)
PROGRESS NOTE    Cindy Faulkner  NWG:[id number] DOB: January 04, 1966 DOA: 12/24/2017 PCP: Lennie Odor, PA-C  Brief Narrative:  The patient is a 52 y.o.femalewith medical history significant ofmetastatic squamous cell carcinoma involving liver & sacrum with unknown primary site(stoped chemo in May),hypertension, hyperlipidemia, stroke, GERD, depression, atrial fibrillation on Xarelto, who presents with altered mental status.  According to the patient's husband, the patient has had a functional decline over the past 2 weeks, now having difficulty getting out of bed without assistance.  In the past 2 to 3 days prior to admission, the patient has had worsening oral intake and increasing lethargy.  The patient has had on and off low-grade temperatures for the past week with a T-max of 101.0 F approximately 3 days prior to this admission.  There has been no headaches, neck pain, chest pain, coughing, hemoptysis, diarrhea, dysuria, hematuria, hematochezia, melena.  Upon presentation, the patient was noted to have sodium 128 and WBC 24.1 and hemoglobin 8.0.  There is concern for infectious source.  Patient was started on empiric vancomycin and Zosyn. Pt admitted for further management.  Encephalopathy improved some but patient feels more SOB and distended. IVF discontinued and Paracentesis attempted but did not have enough fluid to tap. Palliative Care and Hematology/Oncology to re-evaluate and make further recommendations. After exentensive discussion with Hematology/Oncology and Palliative, Goals of Care are going to be shifted to Comfort Care. Palliative helping in the process of arranging Hospice and patient has elected Home Hospice. Dr. Hilma Favors of palliative discussed with the patient and she will be treated for few more days with an oral regimen of antibiotics and switch to Augmentin.  She also has oral thrush and she will be treated with fluconazole as well as Magic mouthwash.  Dr. Domingo Cocking met with the  patient and the family today as she requested blood work to know "what was going on" wants to be stable before transitioning home with home hospice will plan is to reevaluate early next week about transitioning home.  Patient desires no follow-up this week and regarding hospice services and wants to see how things go this weekend and see about transitioning home next week when she feels she is stable to duplicate regimen at home.  Assessment & Plan:   Principal Problem:   Acute metabolic encephalopathy Active Problems:   Essential hypertension   AF (paroxysmal atrial fibrillation) (HCC)   Cerebral thrombosis with cerebral infarction   Stroke (cerebrum) (HCC)   Metastasis to liver (HCC)   Metastatic squamous cell carcinoma involving liver & sacrum with unknown primary site    Hypokalemia   Anemia due to antineoplastic chemotherapy   MDD (major depressive disorder), recurrent severe, without psychosis ([place])   Severe protein-calorie malnutrition (HCC)   Sepsis (HCC)   Hyponatremia   Thrombocytopenia (HCC)  Acute metabolic encephalopathy likely 2/2 worsening metastatic CA with possible sepsis of unknown origin; improved  -Multifactorial including infectious etiology, hyponatremia, dehydration, pancytopenia, cancer of unknown origin ?Cervical Ca as primary -Currently afebrile, with improved leukocytosis -Urinalysis negative for pyuria, UC with no growth -Stool culture for C.diff negative -BC X 2, NGTD at 5 days  -Chest x-ray personally reviewed--no consolidations -Personally reviewed EKG--sinus rhythm, no ST-T wave changes -CT head negative for any mets -Continued empiric IV Zosyn but now changed to po Augmentin, d/c IV Vancomycin as no MRSA growing -Palliative consulted due to very poor prognosis, made DNR -Appreciate further evaluation and recommendations by Palliative; she is being transitioned home with home hospice and will  likely be discharged in the next 24 to 48 hours -Family  requested Repeat CT Scan for confirmation of disease and prognostication and was done last night -Showed Metastatic Disease   Coagulopathy/Thrombocytopenia -No signs of active bleeding -Likely due to sepsis resulting in ??DIC from Cancer -Presenting INR 3.66, PTT 51, fibrinogen 226, DAT negative -D-dimer 14.95 -S/P transfused 2 units PRBC, s/p 1U of platelet 12/27/17 -Hematology/oncology on board and to re-evaluate today -Platelet Count now 18 and  but Hb/Hct dropped to 8.1/27.5 -Did not want to Repeat CBCas Goals are transitioning toward comfort but patient and family request Labwork every few days per her desire to "know what is going on." so it was checked this AM  Intractable nausea/vomiting/abdominal distension/fullness -12/23/2017 CT abdomen and pelvis--interval progression of malignancy with progression of liver metastasis; new abdominal ascites with loculated fluid over the liver and peritoneal nodule in the right upper quadrant; new right upper lobe and right lower lobe lung nodule, multiple splenic and renal infarcts -Scheduled reglan, zyprexa for nausea, decadron as per palliative and Decadron dose has been reduced and is now on 8 mg po Daily -Palliative recommending therapeutic/comfort paracentesis. IR consulted, unable to do 12/27/17 due to coagulopathy and unable to do today as not enough fluid -Tried IV Lasix 40 mg daily for the the last 2 days and this is helped with her abdominal distention.  Will transition to p.o. Lasix as creatinine worsening and place the patient on 40 mg of Lasix daily -Pain management with fentanyl changed to Dilaudid and adde po Oxycodone but changed again by Palliative in hopes of transitioning home; She is now on p.o. oxycodone 10 mg sublingually every 1 hour as needed for breakthrough pain and with IV fentanyl 12.5 to 25 mcg IV q. one hour PRN for moderate pain -Palliative is also added a fentanyl patch 25 mcg transdermally every 72 hours -Patient states  that pain is well controlled today -Palliative following and appreciate GOC discussion and likely transition to home with hospice sometime next week  Cancer of unknown origin likely ?cervical CA -Follows Dr. Alvy Bimler -Has mets to liver and sacrum which have worsened  -last Keytruda 2-3 weeks prior to admission -Holding off all chemo for now and her conditioning is such that she may not tolerate even salvage Chemo -Repeat CT Scan done at family request last night to show disease progression and prognostication -Palliative and Oncology working with patient and family and GOC is transitioning towards comfort care -Palliative helping arrange Hospice as Patient is going to transition home with Home Hospice; further discussion with Palliative Dr. Domingo Cocking revealed that the patient feels that she "wants to try to go with home with home hospice care but feels need to ensure that things are stable before doing so. ... Plan is to transition home with beginning a week was very short of a stable regimen that can be duplicated at home."  Paroxysmal atrial fibrillation -Holding Rivaroxaban secondary to thrombocytopenia -Continue Flecainide 50 mg po Daily for now  -Presently in sinus rhythm  Hypokalemia/Hypomagnesemia -Patient's potassium yesterday was 3.5 and patient's magnesium was not checked this AM  -Continue monitor and replete as necessary -Did not want to Repeat CMP as Goals are transitioning toward comfort but patient and family request Labwork every few days per her desire to "know what is going on." so it was checked this AM   Hyponatremia -due to poor oral intake and dehydration, improved -Discontinued IVF due to Volume Overload  -Given IV Lasix 40 mg Daily  x2 days and will t -Na+ was 136 -> now 134 -Did not want to Repeat CMP as Goals are transitioning toward comfort but patient and family request Labwork every few days per her desire to "know what is going on." so it was checked this AM     Anemia due to antineoplastic chemotherapy -This is likely anemia of chronic disease -Serum C78 elevated, folic acid level low -Iron 25, sats 23, ferritin 4,414 -Hb/Hct now stable at 8.1/27.5 -Continue to Monitor for S/Sx of Bleeding  -Did not want to Repeat CBC as Goals are transitioning toward comfort but patient and family request Labwork every few days per her desire to "know what is going on." so it was checked this AM  MDD (major depressive disorder), recurrent severe, without psychosis ([place]): -Continue Citalopram 20 mg po Daily  -C/w Olanzapine 2.5 mg po qHS  Severe Protein-Calorie Malnutrition  -Appreciate Nutritionist Recc's -C/w Protein Supplement Liquid 11 ounces BID   Leukocytosis -The setting of IV steroid demargination with Decadron use; WBC trended up to 22.0 and is now 21.2 -Now on 8 mg of po Daily Dexamethasone -Continue monitor for signs and symptoms of infection -Continued with empiric antibiotics with IV Zosyn and changed to po Augmentin after discussion with Palliative  AKI -Kidney Function is worsening and BUN/Cr went from 9/0.50 -> 22/1.39 -Worsened by IV Lasix and IV Zosyn probably -Zosyn changed to Augmenting and Lasix changed to po -Did not want to Repeat CMP as Goals are transitioning toward comfort but patient and family request Labwork every few days per her desire to "know what is going on." so labs were checked this AM  DVT prophylaxis: SCDs due to Thrombocytopenia Code Status: DO NOT RESUSCITATE Family Communication: Discussed with family at bedside Disposition Plan: Likely Home with Hospice in the   Consultants:  Palliative Care Hematology Hospice   Procedures:  Attempted Paracentesis    Antimicrobials:  Anti-infectives (From admission, onward)   Start     Dose/Rate Route Frequency Ordered Stop   12/30/17 2200  amoxicillin-clavulanate (AUGMENTIN) 500-125 MG per tablet 500 mg     1 tablet Oral Every 12 hours 12/30/17 1232      12/30/17 1300  fluconazole (DIFLUCAN) tablet 100 mg     100 mg Oral Daily 12/30/17 1229     12/27/17 1900  vancomycin (VANCOCIN) IVPB 750 mg/150 ml premix  Status:  Discontinued     750 mg 150 mL/hr over 60 Minutes Intravenous Every 12 hours 12/27/17 1043 12/27/17 1416   12/27/17 0630  vancomycin (VANCOCIN) IVPB 750 mg/150 ml premix     750 mg 150 mL/hr over 60 Minutes Intravenous  Once 12/27/17 0624 12/27/17 0745   12/26/17 1800  vancomycin (VANCOCIN) IVPB 750 mg/150 ml premix  Status:  Discontinued     750 mg 150 mL/hr over 60 Minutes Intravenous Every 12 hours 12/26/17 1737 12/26/17 1741   12/26/17 1800  vancomycin (VANCOCIN) IVPB 750 mg/150 ml premix     750 mg 150 mL/hr over 60 Minutes Intravenous  Once 12/26/17 1742 12/26/17 1906   12/26/17 1800  vancomycin variable dose per unstable renal function (pharmacist dosing)  Status:  Discontinued      Does not apply See admin instructions 12/26/17 1747 12/27/17 1043   12/26/17 0852  vancomycin variable dose per unstable renal function (pharmacist dosing)  Status:  Discontinued      Does not apply See admin instructions 12/26/17 0853 12/26/17 1737   12/25/17 1600  vancomycin (VANCOCIN) IVPB 750 mg/150  ml premix  Status:  Discontinued     750 mg 150 mL/hr over 60 Minutes Intravenous Every 12 hours 12/25/17 0824 12/26/17 0853   12/25/17 1200  vancomycin (VANCOCIN) IVPB 750 mg/150 ml premix  Status:  Discontinued     750 mg 150 mL/hr over 60 Minutes Intravenous Every 8 hours 12/25/17 0358 12/25/17 0824   12/25/17 1000  piperacillin-tazobactam (ZOSYN) IVPB 3.375 g  Status:  Discontinued     3.375 g 12.5 mL/hr over 240 Minutes Intravenous Every 8 hours 12/25/17 0349 12/30/17 1232   12/25/17 0215  vancomycin (VANCOCIN) 2,000 mg in sodium chloride 0.9 % 500 mL IVPB     2,000 mg 250 mL/hr over 120 Minutes Intravenous  Once 12/25/17 0154 12/25/17 0504   12/25/17 0200  piperacillin-tazobactam (ZOSYN) IVPB 3.375 g     3.375 g 100 mL/hr over 30  Minutes Intravenous NOW 12/25/17 0154 12/25/17 0333     Subjective: Seen and examined at bedside and she states that she had a good day and she appeared, comfortable.  States that the pain medication was not working and she had no complaints.  Thinks the Lasix helped yesterday.  No nausea or vomiting.  No other concerns or complaints at this time.   Objective: Vitals:   12/28/17 2014 12/29/17 0411 12/29/17 2200 12/30/17 1957  BP: (!) 138/92 (!) 147/85 130/87   Pulse: 85 97 83   Resp: _0 Temp:  97.7 F (36.5 C) 97.7 F (36.5 C)   TempSrc:  Oral    SpO2: 100% 100%    Weight:      Height:        Intake/Output Summary (Last 24 hours) at 12/31/2017 1250 Last data filed at 12/31/2017 0725 Gross per 24 hour  Intake 0 ml  Output 925 ml  Net -925 ml   Filed Weights   12/24/17 2104  Weight: 99.8 kg   Examination: Physical Exam:  Constitutional: Well-nourished, well-developed obese Caucasian female currently in no acute distress and appears calm and comfortable today. Eyes: Conjunctive are normal.  Sclera anicteric ENMT: External ears and nose appear normal.  Grossly normal hearing Neck: Appears supple with no JVD Respiratory: Initial auscultation bilaterally no appreciable wheezing, rales, rhonchi.  Had unlabored breathing and no accessory muscle usage Cardiovascular: Regular rate and rhythm.  No appreciable murmurs, rubs, gallops.  Has 1+ lower extremity edema noted today Abdomen: Soft, not tender to palpate today.  Patient's abdomen is distended.  Bowel sounds present GU: Deferred Musculoskeletal: No contractures or cyanosis.  No joint swelling noted Skin: No appreciable rashes or lesions on limited skin evaluation Neurologic: Renal nerves II through XII grossly intact no appreciable focal deficit Psychiatric: Pleasant mood and affect.  Intact judgment intact.  Patient is awake, alert and oriented  Data Reviewed: I have personally reviewed following labs and imaging  studies  CBC: Recent Labs  Lab 12/26/17 0636 12/27/17 0509 12/28/17 0331 12/29/17 0344 12/30/17 0825 12/31/17 1051  WBC 20.4* 10.1 16.4* 18.8* 22.0* 21.2*  NEUTROABS 17.4* 8.7* 14.0* 16.0*  --  18.7*  HGB 8.9* 8.6* 7.8* 8.2* 8.2* 8.1*  HCT 28.8* 28.9* 26.3* 27.4* 27.6* 27.5*  MCV 90.0 91.2 92.6 94.2 95.5 93.5  PLT 28* 19* 31* 18* 14* 18*   Basic Metabolic Panel: Recent Labs  Lab 12/25/17 0245  12/26/17 0636 12/27/17 0509 12/28/17 0331 12/29/17 0344 12/31/17 1051  NA  --    < > 130* 132* 136 136 134*  K  --    < >  3.7 4.0 3.7 3.6 3.5  CL  --    < > 100 100 106 103 102  CO2  --    < > 23 22 19* 21* 20*  GLUCOSE  --    < > 169* 197* 166* 132* 171*  BUN  --    < > _0 22*  CREATININE  --    < > 0.94 1.08* 1.06* 1.10* 1.39*  CALCIUM  --    < > 7.8* 8.1* 8.4* 9.0 8.7*  MG 1.3*  --  1.6* 1.0* 2.6* 1.9  --   PHOS  --   --  2.3* 3.1 2.9 3.2  --    < > = values in this interval not displayed.   GFR: Estimated Creatinine Clearance: 56.2 mL/min (A) (by C-G formula based on SCr of 1.39 mg/dL (H)). Liver Function Tests: Recent Labs  Lab 12/24/17 2141 12/25/17 0619 12/26/17 0636 12/27/17 0509 12/28/17 0331 12/29/17 0344 12/31/17 1051  AST 47* 39 38  --   --  40 40  ALT _1 --   --  21 23  ALKPHOS 168* 138* 154*  --   --  231* 188*  BILITOT 1.4* 1.4* 1.6*  --   --  1.4* 1.1  PROT 5.8* 5.0* 5.1*  --   --  5.9* 5.6*  ALBUMIN 2.0* 1.7* 1.8* 2.3* 2.5* 3.1* 2.7*   Recent Labs  Lab 12/24/17 2141  LIPASE 18   Recent Labs  Lab 12/25/17 1215  AMMONIA 19   Coagulation Profile: Recent Labs  Lab 12/25/17 0047 12/25/17 1215 12/27/17 0509 12/28/17 1111  INR 3.66 3.10 2.89 2.27   Cardiac Enzymes: No results for input(s): CKTOTAL, CKMB, CKMBINDEX, TROPONINI in the last 168 hours. BNP (last 3 results) No results for input(s): PROBNP in the last 8760 hours. HbA1C: No results for input(s): HGBA1C in the last 72 hours. CBG: Recent Labs  Lab  12/28/17 0736 12/28/17 1150 12/28/17 1647 12/28/17 2104 12/29/17 0735  GLUCAP 115* 169* 166* 149* 120*   Lipid Profile: No results for input(s): CHOL, HDL, LDLCALC, TRIG, CHOLHDL, LDLDIRECT in the last 72 hours. Thyroid Function Tests: No results for input(s): TSH, T4TOTAL, FREET4, T3FREE, THYROIDAB in the last 72 hours. Anemia Panel: No results for input(s): VITAMINB12, FOLATE, FERRITIN, TIBC, IRON, RETICCTPCT in the last 72 hours. Sepsis Labs: Recent Labs  Lab 12/25/17 0219 12/25/17 0619  PROCALCITON 0.85  --   LATICACIDVEN 1.8 1.7    Recent Results (from the past 240 hour(s))  Culture, blood (Routine X 2) w Reflex to ID Panel     Status: None   Collection Time: 12/25/17  2:19 AM  Result Value Ref Range Status   Specimen Description   Final    BLOOD RIGHT PORTA CATH Performed at [facility] [address] [place], [place] [address]    Special Requests   Final    BOTTLES DRAWN AEROBIC AND ANAEROBIC Blood Culture adequate volume Performed at [facility] [address], [place], [place] [address]    Culture   Final    NO GROWTH 5 DAYS Performed at [facility], [place] [address], [place], [place] [address]    Report Status 12/30/2017 FINAL  Final  Culture, blood (Routine X 2) w Reflex to ID Panel     Status: None   Collection Time: 12/25/17  2:19 AM  Result Value Ref Range Status   Specimen Description   Final  BLOOD LEFT FOREARM Performed at [facility] [address] [place], [place] [address]    Special Requests   Final    BOTTLES DRAWN AEROBIC AND ANAEROBIC Blood Culture adequate volume Performed at [facility] [address], [place], [place] [address]    Culture   Final    NO GROWTH 5 DAYS Performed at [facility], [place] [address], [place], [place] [address]    Report Status 12/30/2017 FINAL  Final  Urine Culture     Status: None   Collection Time: 12/25/17   6:30 AM  Result Value Ref Range Status   Specimen Description   Final    URINE, CLEAN CATCH Performed at [facility], [place] [address], [place], [place] [address]    Special Requests   Final    NONE Performed at [facility], [place] [address], [place], [place] [address]    Culture   Final    NO GROWTH Performed at [facility], [place] [address] [place], [place] [address]    Report Status 12/26/2017 FINAL  Final  C difficile quick scan w PCR reflex     Status: None   Collection Time: 12/25/17  3:47 PM  Result Value Ref Range Status   C Diff antigen NEGATIVE NEGATIVE Final   C Diff toxin NEGATIVE NEGATIVE Final   C Diff interpretation No C. difficile detected.  Final    Comment: Performed at [facility], [place] [address] [place], [place] [address]  Stool culture (children & immunocomp patients)     Status: None   Collection Time: 12/25/17  3:47 PM  Result Value Ref Range Status   Salmonella/Shigella Screen Final report  Final   Campylobacter Culture Final report  Final   E coli, Shiga toxin Assay Negative Negative Final    Comment: (NOTE) Performed At: [facility] [place], [place] [id number] Rush Farmer MD [id number]   STOOL CULTURE REFLEX - RSASHR     Status: None   Collection Time: 12/25/17  3:47 PM  Result Value Ref Range Status   Stool Culture result 1 (RSASHR) Comment  Final    Comment: (NOTE) No Salmonella or Shigella recovered. Performed At: [facility] [address] [place], [place] [id number] Rush Farmer MD [id number]   STOOL CULTURE Reflex - CMPCXR     Status: None   Collection Time: 12/25/17  3:47 PM  Result Value Ref Range Status   Stool Culture result 1 (CMPCXR) Comment  Final    Comment: (NOTE) No Campylobacter species isolated. Performed At: [facility] [place] [place], [place] [id number] Rush Farmer MD [id number]      Radiology Studies: No results found.  Scheduled Meds: . sodium chloride   Intravenous Once  . amoxicillin-clavulanate  1 tablet Oral Q12H  . citalopram  10 mg Oral Daily  . dexamethasone  8 mg Oral Daily  . fentaNYL  25 mcg Transdermal Q72H  . flecainide  50 mg Oral Daily  . fluconazole  100 mg Oral Daily  . furosemide  40 mg Oral Daily  . metoCLOPramide  5 mg Oral TID AC  . OLANZapine zydis  5 mg Oral QHS  . pantoprazole  40 mg Oral Daily  . protein supplement shake  11 oz Oral BID BM   Continuous Infusions: . sodium chloride      LOS: 6 days   Kerney Elbe, DO [facility] PAGER is  on AMION  If 7PM-7AM, please contact night-coverage [email or web address] Password [email or web address] 12/31/2017, 12:50 PM

## 2018-01-01 LAB — COMPREHENSIVE METABOLIC PANEL
ALBUMIN: 2.7 g/dL — AB (ref 3.5–5.0)
ALK PHOS: 181 U/L — AB (ref 38–126)
ALT: 21 U/L (ref 0–44)
AST: 36 U/L (ref 15–41)
Anion gap: 11 (ref 5–15)
BILIRUBIN TOTAL: 1.7 mg/dL — AB (ref 0.3–1.2)
BUN: 20 mg/dL (ref 6–20)
CO2: 23 mmol/L (ref 22–32)
Calcium: 9 mg/dL (ref 8.9–10.3)
Chloride: 99 mmol/L (ref 98–111)
Creatinine, Ser: 1.18 mg/dL — ABNORMAL HIGH (ref 0.44–1.00)
GFR calc Af Amer: 59 mL/min — ABNORMAL LOW (ref >60)
GFR calc non Af Amer: 51 mL/min — ABNORMAL LOW (ref >60)
GLUCOSE: 156 mg/dL — AB (ref 70–99)
Potassium: 3.5 mmol/L (ref 3.5–5.1)
Sodium: 133 mmol/L — ABNORMAL LOW (ref 135–145)
TOTAL PROTEIN: 5.7 g/dL — AB (ref 6.5–8.1)

## 2018-01-01 LAB — CBC WITH DIFFERENTIAL/PLATELET
ABS IMMATURE GRANULOCYTES: 0.3 10*3/uL — AB (ref 0.00–0.07)
BASOS ABS: 0 10*3/uL (ref 0.0–0.1)
Basophils Relative: 0 %
Eosinophils Absolute: 0 10*3/uL (ref 0.0–0.5)
Eosinophils Relative: 0 %
HEMATOCRIT: 28 % — AB (ref 36.0–46.0)
HEMOGLOBIN: 8.5 g/dL — AB (ref 12.0–15.0)
IMMATURE GRANULOCYTES: 1 %
LYMPHS ABS: 1.6 10*3/uL (ref 0.7–4.0)
LYMPHS PCT: 8 %
MCH: 28.4 pg (ref 26.0–34.0)
MCHC: 30.4 g/dL (ref 30.0–36.0)
MCV: 93.6 fL (ref 80.0–100.0)
Monocytes Absolute: 0.9 10*3/uL (ref 0.1–1.0)
Monocytes Relative: 4 %
NEUTROS ABS: 18.9 10*3/uL — AB (ref 1.7–7.7)
NEUTROS PCT: 87 %
NRBC: 0 % (ref 0.0–0.2)
Platelets: 11 10*3/uL — CL (ref 150–400)
RBC: 2.99 MIL/uL — ABNORMAL LOW (ref 3.87–5.11)
RDW: 19.3 % — ABNORMAL HIGH (ref 11.5–15.5)
WBC: 21.7 10*3/uL — ABNORMAL HIGH (ref 4.0–10.5)

## 2018-01-01 LAB — PHOSPHORUS: PHOSPHORUS: 3.3 mg/dL (ref 2.5–4.6)

## 2018-01-01 LAB — GLUCOSE, CAPILLARY: Glucose-Capillary: 310 mg/dL — ABNORMAL HIGH (ref 70–99)

## 2018-01-01 LAB — MAGNESIUM: Magnesium: 1.2 mg/dL — ABNORMAL LOW (ref 1.7–2.4)

## 2018-01-01 MED ORDER — FUROSEMIDE 10 MG/ML IJ SOLN
20.0000 mg | Freq: Every day | INTRAMUSCULAR | Status: DC
  Administered 2018-01-01 – 2018-01-06 (×6): 20 mg via INTRAVENOUS
  Filled 2018-01-01 (×6): qty 2

## 2018-01-01 MED ORDER — INSULIN ASPART 100 UNIT/ML ~~LOC~~ SOLN
5.0000 [IU] | Freq: Once | SUBCUTANEOUS | Status: AC
  Administered 2018-01-01: 5 [IU] via SUBCUTANEOUS

## 2018-01-01 MED ORDER — FLUCONAZOLE 100MG IVPB
100.0000 mg | INTRAVENOUS | Status: DC
  Administered 2018-01-01 – 2018-01-04 (×4): 100 mg via INTRAVENOUS
  Filled 2018-01-01 (×6): qty 50

## 2018-01-01 MED ORDER — DEXAMETHASONE SODIUM PHOSPHATE 4 MG/ML IJ SOLN
8.0000 mg | INTRAMUSCULAR | Status: DC
  Administered 2018-01-01 – 2018-01-06 (×6): 8 mg via INTRAVENOUS
  Filled 2018-01-01 (×6): qty 2

## 2018-01-01 MED ORDER — MAGNESIUM SULFATE 50 % IJ SOLN
3.0000 g | Freq: Once | INTRAVENOUS | Status: AC
  Administered 2018-01-01: 3 g via INTRAVENOUS
  Filled 2018-01-01 (×2): qty 6

## 2018-01-01 MED ORDER — PIPERACILLIN-TAZOBACTAM 3.375 G IVPB
3.3750 g | Freq: Three times a day (TID) | INTRAVENOUS | Status: DC
  Administered 2018-01-01 – 2018-01-02 (×5): 3.375 g via INTRAVENOUS
  Filled 2018-01-01 (×6): qty 50

## 2018-01-01 MED ORDER — METOCLOPRAMIDE HCL 5 MG/ML IJ SOLN
10.0000 mg | Freq: Three times a day (TID) | INTRAMUSCULAR | Status: DC
  Administered 2018-01-01 – 2018-01-05 (×16): 10 mg via INTRAVENOUS
  Filled 2018-01-01 (×17): qty 2

## 2018-01-01 MED ORDER — PANTOPRAZOLE SODIUM 40 MG IV SOLR
40.0000 mg | INTRAVENOUS | Status: DC
  Administered 2018-01-01 – 2018-01-06 (×6): 40 mg via INTRAVENOUS
  Filled 2018-01-01 (×6): qty 40

## 2018-01-01 NOTE — Progress Notes (Signed)
PROGRESS NOTE    Cindy Faulkner  QQI:[id number] DOB: 13-Aug-1965 DOA: 12/24/2017 PCP: Lennie Odor, PA-C  Brief Narrative:  The patient is a 52 y.o.femalewith medical history significant ofmetastatic squamous cell carcinoma involving liver & sacrum with unknown primary site(stoped chemo in May),hypertension, hyperlipidemia, stroke, GERD, depression, atrial fibrillation on Xarelto, who presents with altered mental status.  According to the patient's husband, the patient has had a functional decline over the past 2 weeks, now having difficulty getting out of bed without assistance.  In the past 2 to 3 days prior to admission, the patient has had worsening oral intake and increasing lethargy.  The patient has had on and off low-grade temperatures for the past week with a T-max of 101.0 F approximately 3 days prior to this admission.  There has been no headaches, neck pain, chest pain, coughing, hemoptysis, diarrhea, dysuria, hematuria, hematochezia, melena.  Upon presentation, the patient was noted to have sodium 128 and WBC 24.1 and hemoglobin 8.0.  There is concern for infectious source.  Patient was started on empiric vancomycin and Zosyn. Pt admitted for further management.  Encephalopathy improved some but patient feels more SOB and distended. IVF discontinued and Paracentesis attempted but did not have enough fluid to tap. Palliative Care and Hematology/Oncology to re-evaluate and make further recommendations. After exentensive discussion with Hematology/Oncology and Palliative, Goals of Care are going to be shifted to Comfort Care. Palliative helping in the process of arranging Hospice and patient has elected Home Hospice. Dr. Hilma Favors of palliative discussed with the patient and she will be treated for few more days with an oral regimen of antibiotics and switch to Augmentin.  She also has oral thrush and she will be treated with fluconazole as well as Magic mouthwash.    Dr. Domingo Cocking met with  the patient and the family yesterday as she requested blood work to know "what was going on" wants to be stable before transitioning home with home hospice will plan is to reevaluate early next week about transitioning home.  Patient desires no follow-up this week regarding hospice services and wants to see how things go this weekend and see about transitioning home next week when she feels she is stable to duplicate regimen at home.  Patient acutely worsened and reported nausea which worsened after attempting to take pills and patient vomited today.  Most of her p.o. medications will be changed back to IV form at this time as patient wants to stay in the hospital and see how things go and how she feels after being back on IV pain regimen.  Palliative continuing goals of care discussion with the patient and family.  Assessment & Plan:   Principal Problem:   Acute metabolic encephalopathy Active Problems:   Essential hypertension   AF (paroxysmal atrial fibrillation) (HCC)   Cerebral thrombosis with cerebral infarction   Stroke (cerebrum) (HCC)   Metastasis to liver (HCC)   Metastatic squamous cell carcinoma involving liver & sacrum with unknown primary site    Hypokalemia   Anemia due to antineoplastic chemotherapy   MDD (major depressive disorder), recurrent severe, without psychosis ([place])   Severe protein-calorie malnutrition (HCC)   Sepsis (HCC)   Hyponatremia   Thrombocytopenia (HCC)  Acute metabolic encephalopathy likely 2/2 worsening metastatic CA with possible sepsis of unknown origin; improved  -Multifactorial including infectious etiology, hyponatremia, dehydration, pancytopenia, cancer of unknown origin ?Cervical Ca as primary -Currently afebrile, with improved leukocytosis -Urinalysis negative for pyuria, UC with no growth -Stool culture for C.diff  negative -BC X 2, NGTD at 5 days  -Chest x-ray personally reviewed--no consolidations -Personally reviewed EKG--sinus rhythm, no  ST-T wave changes -CT head negative for any mets -Continued empiric IV Zosyn but now changed to po Augmentin patient unable to tolerate p.o. keep anything down due to vomiting so we will change back to IV Zosyn.D/C'd IV Vancomycin as no MRSA growing -Palliative consulted due to very poor prognosis, made DNR -Appreciate further evaluation and recommendations by Palliative; they are taking it day by day and continue to monitor patient's progress and readiness to transition home with home hospice.  At this time she wants to continue IV regimen and see how she feels rather than transitioning home to home hospice currently -Family requested Repeat CT Scan for confirmation of disease and prognostication and was done last night -Showed Metastatic Disease   Coagulopathy/Thrombocytopenia -No signs of active bleeding -Likely due to sepsis resulting in ??DIC from Cancer -Presenting INR 3.66, PTT 51, fibrinogen 226, DAT negative -D-dimer 14.95 -S/P transfused 2 units PRBC, s/p 1U of platelet 12/27/17 -Hematology/oncology on board and to re-evaluate today -Platelet Count now 11 and  but Hb/Hct now is 8.5/20.0 -Repeat CBC in AM   Intractable nausea/vomiting/abdominal distension/fullness -12/23/2017 CT abdomen and pelvis--interval progression of malignancy with progression of liver metastasis; new abdominal ascites with loculated fluid over the liver and peritoneal nodule in the right upper quadrant; new right upper lobe and right lower lobe lung nodule, multiple splenic and renal infarcts -Scheduled reglan, zyprexa for nausea, decadron as per palliative and Decadron dose has been reduced and is now on 8 mg po Daily but had to be changed back to IV given her nausea and vomiting -Palliative recommending therapeutic/comfort paracentesis. IR consulted, unable to do 12/27/17 due to coagulopathy and unable to do today as not enough fluid -Tried IV Lasix 40 mg daily for the the last 2 days and this is helped with  her abdominal distention.  Will transition to p.o. Lasix as creatinine worsening and place the patient on 40 mg of Lasix daily however patient unable to tolerate p.o. at this time so changes IV 20 mg daily -Pain management with fentanyl changed to Dilaudid and adde po Oxycodone but changed again by Palliative in hopes of transitioning home; She is now on p.o. oxycodone 10 mg sublingually every 1 hour as needed for breakthrough pain and with IV fentanyl 12.5 to 25 mcg IV q. one hour PRN for moderate pain -Palliative is also added a fentanyl patch 25 mcg transdermally every 72 hours -Patient states that pain is well controlled today -Palliative following and appreciate GOC discussion and likely transition to home with hospice sometime next week -Patient was unable to tolerate p.o. today all her medications were changed back to IV and palliative care has changed metoclopramide 10 mg IV 3 times daily before meals and bedtime  Cancer of unknown origin likely ?cervical CA -Follows Dr. Alvy Bimler -Has mets to liver and sacrum which have worsened  -last Keytruda 2-3 weeks prior to admission -Holding off all chemo for now and her conditioning is such that she may not tolerate even salvage Chemo -Repeat CT Scan done at family request last night to show disease progression and prognostication -Palliative and Oncology working with patient and family and GOC is transitioning towards comfort care -Palliative helping arrange Hospice as Patient is going to transition home with Home Hospice; further discussion with Palliative Dr. Domingo Cocking revealed that the patient feels that she "wants to try to go with home with  home hospice care but feels need to ensure that things are stable before doing so. ... Plan is to transition home with beginning a week was very short of a stable regimen that can be duplicated at home."  Paroxysmal Atrial Fibrillation -Holding Rivaroxaban secondary to thrombocytopenia -Continue Flecainide  50 mg po Daily for now if able -Presently in sinus rhythm but tachycardic   Hypokalemia/Hypomagnesemia -Patient's potassium this AM was 3.5 and Mag Level was 1.2 -Repelet with IV Mag Sulfate 3 Grams -Continue monitor and replete as necessary -Repeat CMP in AM   Hyponatremia -due to poor oral intake and dehydration, improved -Discontinued IVF due to Volume Overload  -Given IV Lasix 40 mg Daily x2 days and transitioned to po but unable to tolerate so will give IV 20 mg Daily  -Na+ was 136 -> now 133 -Repeat CMP in AM   Anemia due to antineoplastic chemotherapy -This is likely anemia of chronic disease -Serum X65 elevated, folic acid level low -Iron 25, sats 23, ferritin 4,414 -Hb/Hct now stable at 8.5./28.0 -Continue to Monitor for S/Sx of Bleeding  -Repeat CBC in AM   MDD (major depressive disorder), recurrent severe, without psychosis ([place]): -Continue Citalopram 20 mg po Daily if able -C/w Olanzapine 2.5 mg po qHS  Severe Protein-Calorie Malnutrition  -Appreciate Nutritionist Recc's -C/w Protein Supplement Liquid 11 ounces BID   Leukocytosis -The setting of IV steroid demargination with Decadron use; WBC trended up to 22.0 and is now 21.7 -Now on 8 mg of po Daily Dexamethasone but had to change to IV due to Nausea and Vomiting -Continue monitor for signs and symptoms of infection -Continued with empiric antibiotics with IV Zosyn and changed to po Augmentin but patient unable to tolerate so changed back to IV Zosyn -Repeat CBC in AM   AKI -Kidney Function is worsening and BUN/Cr went from 9/0.50 -> 22/1.39  -> 20/1.18 -Worsened by IV Lasix and IV Zosyn probably -Zosyn changed to Augmenting and Lasix changed to po but unable to tolerate so changed back to IV -Repeat CMP in AM   DVT prophylaxis: SCDs due to Thrombocytopenia Code Status: DO NOT RESUSCITATE Family Communication: Discussed with family at bedside Disposition Plan: Likely Home with Hospice in the    Consultants:  Palliative Care Hematology Hospice   Procedures:  Attempted Paracentesis    Antimicrobials:  Anti-infectives (From admission, onward)   Start     Dose/Rate Route Frequency Ordered Stop   01/01/18 1130  fluconazole (DIFLUCAN) IVPB 100 mg     100 mg 50 mL/hr over 60 Minutes Intravenous Every 24 hours 01/01/18 1108     01/01/18 1000  piperacillin-tazobactam (ZOSYN) IVPB 3.375 g     3.375 g 12.5 mL/hr over 240 Minutes Intravenous Every 8 hours 01/01/18 0959     12/30/17 2200  amoxicillin-clavulanate (AUGMENTIN) 500-125 MG per tablet 500 mg  Status:  Discontinued     1 tablet Oral Every 12 hours 12/30/17 1232 01/01/18 0938   12/30/17 1300  fluconazole (DIFLUCAN) tablet 100 mg  Status:  Discontinued     100 mg Oral Daily 12/30/17 1229 01/01/18 1108   12/27/17 1900  vancomycin (VANCOCIN) IVPB 750 mg/150 ml premix  Status:  Discontinued     750 mg 150 mL/hr over 60 Minutes Intravenous Every 12 hours 12/27/17 1043 12/27/17 1416   12/27/17 0630  vancomycin (VANCOCIN) IVPB 750 mg/150 ml premix     750 mg 150 mL/hr over 60 Minutes Intravenous  Once 12/27/17 0624 12/27/17 0745   12/26/17  1800  vancomycin (VANCOCIN) IVPB 750 mg/150 ml premix  Status:  Discontinued     750 mg 150 mL/hr over 60 Minutes Intravenous Every 12 hours 12/26/17 1737 12/26/17 1741   12/26/17 1800  vancomycin (VANCOCIN) IVPB 750 mg/150 ml premix     750 mg 150 mL/hr over 60 Minutes Intravenous  Once 12/26/17 1742 12/26/17 1906   12/26/17 1800  vancomycin variable dose per unstable renal function (pharmacist dosing)  Status:  Discontinued      Does not apply See admin instructions 12/26/17 1747 12/27/17 1043   12/26/17 0852  vancomycin variable dose per unstable renal function (pharmacist dosing)  Status:  Discontinued      Does not apply See admin instructions 12/26/17 0853 12/26/17 1737   12/25/17 1600  vancomycin (VANCOCIN) IVPB 750 mg/150 ml premix  Status:  Discontinued     750 mg 150 mL/hr over  60 Minutes Intravenous Every 12 hours 12/25/17 0824 12/26/17 0853   12/25/17 1200  vancomycin (VANCOCIN) IVPB 750 mg/150 ml premix  Status:  Discontinued     750 mg 150 mL/hr over 60 Minutes Intravenous Every 8 hours 12/25/17 0358 12/25/17 0824   12/25/17 1000  piperacillin-tazobactam (ZOSYN) IVPB 3.375 g  Status:  Discontinued     3.375 g 12.5 mL/hr over 240 Minutes Intravenous Every 8 hours 12/25/17 0349 12/30/17 1232   12/25/17 0215  vancomycin (VANCOCIN) 2,000 mg in sodium chloride 0.9 % 500 mL IVPB     2,000 mg 250 mL/hr over 120 Minutes Intravenous  Once 12/25/17 0154 12/25/17 0504   12/25/17 0200  piperacillin-tazobactam (ZOSYN) IVPB 3.375 g     3.375 g 100 mL/hr over 30 Minutes Intravenous NOW 12/25/17 0154 12/25/17 0333     Subjective: Seen and examined at bedside is resting but states she had a rough morning and states that she is unable to tolerate anything p.o. until all her pills this morning.  Requested that her medication be changed back to IV as she does not want an attempt to try to swallow.  Of nausea and vomiting.  Family still hoping for a miracle and palliative to continue goals of care discussion.  Patient wants to remain hospitalized currently rather than transferred to home hospice at this time as she wants to see if the IVs will help her.  Objective: Vitals:   12/31/17 1300 12/31/17 2049 01/01/18 0527 01/01/18 1353  BP: (!) 146/92 (!) 148/92 (!) 148/86 (!) 141/110  Pulse: (!) 115 (!) 110 (!) 110 (!) 160  Resp: '18 19 18 ' (!) 22  Temp: 98.1 F (36.7 C) 98.2 F (36.8 C) 98.3 F (36.8 C)   TempSrc: Oral Oral Oral   SpO2: 97% 98% 94% 96%  Weight:      Height:        Intake/Output Summary (Last 24 hours) at 01/01/2018 1659 Last data filed at 01/01/2018 1434 Gross per 24 hour  Intake -  Output 2800 ml  Net -2800 ml   Filed Weights   12/24/17 2104  Weight: 99.8 kg   Examination: Physical Exam:  Constitutional: Well-nourished, well-developed obese  Caucasian female currently no acute distress resting Eyes: Conjunctiva are normal.  Sclera anicteric ENMT: External ears and nose appear normal.  Grossly normal hearing Neck: Appears supple no JVD Respiratory: Diminished to auscultation bilaterally no appreciable wheezing, rales, rhonchi. patient was not tachypneic wheezing excess muscles to breathe Cardiovascular: Tachycardic rate and slightly irregular.  No appreciable murmurs, rubs or gallops.  Has trace lower extremity edema Abdomen:  Soft, nontender to palpate.  Abdomen is somewhat distended.  Bowel sounds present GU: Deferred Musculoskeletal: No contractures or cyanosis noted.  No joint deformities Skin: Skin is warm and dry no appreciable rashes or lesions limited skin evaluation.  Has some mild lower extremity edema Neurologic: Cranial nerves II through XII grossly intact no appreciable focal deficit Psychiatric: Somnolent but arousable.  Has intact judgment intact.  She is alert and oriented  Data Reviewed: I have personally reviewed following labs and imaging studies  CBC: Recent Labs  Lab 12/27/17 0509 12/28/17 0331 12/29/17 0344 12/30/17 0825 12/31/17 1051 01/01/18 0420  WBC 10.1 16.4* 18.8* 22.0* 21.2* 21.7*  NEUTROABS 8.7* 14.0* 16.0*  --  18.7* 18.9*  HGB 8.6* 7.8* 8.2* 8.2* 8.1* 8.5*  HCT 28.9* 26.3* 27.4* 27.6* 27.5* 28.0*  MCV 91.2 92.6 94.2 95.5 93.5 93.6  PLT 19* 31* 18* 14* 18* 11*   Basic Metabolic Panel: Recent Labs  Lab 12/26/17 0636 12/27/17 0509 12/28/17 0331 12/29/17 0344 12/31/17 1051 01/01/18 0420  NA 130* 132* 136 136 134* 133*  K 3.7 4.0 3.7 3.6 3.5 3.5  CL 100 100 106 103 102 99  CO2 23 22 19* 21* 20* 23  GLUCOSE 169* 197* 166* 132* 171* 156*  BUN '11 12 14 14 ' 22* 20  CREATININE 0.94 1.08* 1.06* 1.10* 1.39* 1.18*  CALCIUM 7.8* 8.1* 8.4* 9.0 8.7* 9.0  MG 1.6* 1.0* 2.6* 1.9  --  1.2*  PHOS 2.3* 3.1 2.9 3.2  --  3.3   GFR: Estimated Creatinine Clearance: 66.2 mL/min (A) (by C-G formula  based on SCr of 1.18 mg/dL (H)). Liver Function Tests: Recent Labs  Lab 12/26/17 0636 12/27/17 0509 12/28/17 0331 12/29/17 0344 12/31/17 1051 01/01/18 0420  AST 38  --   --  40 40 36  ALT 23  --   --  '21 23 21  ' ALKPHOS 154*  --   --  231* 188* 181*  BILITOT 1.6*  --   --  1.4* 1.1 1.7*  PROT 5.1*  --   --  5.9* 5.6* 5.7*  ALBUMIN 1.8* 2.3* 2.5* 3.1* 2.7* 2.7*   No results for input(s): LIPASE, AMYLASE in the last 168 hours. No results for input(s): AMMONIA in the last 168 hours. Coagulation Profile: Recent Labs  Lab 12/27/17 0509 12/28/17 1111  INR 2.89 2.27   Cardiac Enzymes: No results for input(s): CKTOTAL, CKMB, CKMBINDEX, TROPONINI in the last 168 hours. BNP (last 3 results) No results for input(s): PROBNP in the last 8760 hours. HbA1C: No results for input(s): HGBA1C in the last 72 hours. CBG: Recent Labs  Lab 12/28/17 1647 12/28/17 2104 12/29/17 0735 12/31/17 1419 12/31/17 2052  GLUCAP 166* 149* 120* 148* 182*   Lipid Profile: No results for input(s): CHOL, HDL, LDLCALC, TRIG, CHOLHDL, LDLDIRECT in the last 72 hours. Thyroid Function Tests: No results for input(s): TSH, T4TOTAL, FREET4, T3FREE, THYROIDAB in the last 72 hours. Anemia Panel: No results for input(s): VITAMINB12, FOLATE, FERRITIN, TIBC, IRON, RETICCTPCT in the last 72 hours. Sepsis Labs: No results for input(s): PROCALCITON, LATICACIDVEN in the last 168 hours.  Recent Results (from the past 240 hour(s))  Culture, blood (Routine X 2) w Reflex to ID Panel     Status: None   Collection Time: 12/25/17  2:19 AM  Result Value Ref Range Status   Specimen Description   Final    BLOOD RIGHT PORTA CATH Performed at [facility] [address], [place], [place] [address]  Special Requests   Final    BOTTLES DRAWN AEROBIC AND ANAEROBIC Blood Culture adequate volume Performed at [facility] [address], [place], [place] [address]    Culture   Final     NO GROWTH 5 DAYS Performed at [facility], [place] [address] [place], [place] [address]    Report Status 12/30/2017 FINAL  Final  Culture, blood (Routine X 2) w Reflex to ID Panel     Status: None   Collection Time: 12/25/17  2:19 AM  Result Value Ref Range Status   Specimen Description   Final    BLOOD LEFT FOREARM Performed at [facility] [address], [place], [place] [address]    Special Requests   Final    BOTTLES DRAWN AEROBIC AND ANAEROBIC Blood Culture adequate volume Performed at [facility] [address], [place], [place] [address]    Culture   Final    NO GROWTH 5 DAYS Performed at [facility], [place] [address] [place], [place] [address]    Report Status 12/30/2017 FINAL  Final  Urine Culture     Status: None   Collection Time: 12/25/17  6:30 AM  Result Value Ref Range Status   Specimen Description   Final    URINE, CLEAN CATCH Performed at [facility], [place] [address], [place], [place] [address]    Special Requests   Final    NONE Performed at [facility], [place] [address], [place], [place] [address]    Culture   Final    NO GROWTH Performed at [facility], [place] [address], [place],  [address]    Report Status 12/26/2017 FINAL  Final  C difficile quick scan w PCR reflex     Status: None   Collection Time: 12/25/17  3:47 PM  Result Value Ref Range Status   C Diff antigen NEGATIVE NEGATIVE Final   C Diff toxin NEGATIVE NEGATIVE Final   C Diff interpretation No C. difficile detected.  Final    Comment: Performed at [facility], [place] [address] [place],  [address]  Stool culture (children & immunocomp patients)     Status: None   Collection Time: 12/25/17  3:47 PM  Result Value Ref Range Status   Salmonella/Shigella Screen Final report  Final   Campylobacter Culture Final report  Final   E coli, Shiga toxin Assay Negative Negative  Final    Comment: (NOTE) Performed At: [facility] [place], [place] [id number] Rush Farmer MD [id number]   STOOL CULTURE REFLEX - RSASHR     Status: None   Collection Time: 12/25/17  3:47 PM  Result Value Ref Range Status   Stool Culture result 1 (RSASHR) Comment  Final    Comment: (NOTE) No Salmonella or Shigella recovered. Performed At: [facility] [address] [place], [place] [id number] Rush Farmer MD [id number]   STOOL CULTURE Reflex - CMPCXR     Status: None   Collection Time: 12/25/17  3:47 PM  Result Value Ref Range Status   Stool Culture result 1 (CMPCXR) Comment  Final    Comment: (NOTE) No Campylobacter species isolated. Performed At: [facility] [place], [place] [id number] Rush Farmer MD [id number]     Radiology Studies: No results found.  Scheduled Meds: . sodium chloride   Intravenous Once  . citalopram  10 mg Oral Daily  . dexamethasone  8 mg Intravenous Q24H  . fentaNYL  25 mcg Transdermal Q72H  . flecainide  50 mg Oral Daily  . furosemide  20 mg Intravenous Daily  . metoCLOPramide (REGLAN) injection  10 mg Intravenous TID AC & HS  . OLANZapine zydis  5 mg Oral QHS  . pantoprazole (PROTONIX) IV  40 mg Intravenous Q24H  . protein supplement shake  11 oz Oral BID BM   Continuous Infusions: . sodium chloride    . fluconazole (DIFLUCAN) IV 100 mg (01/01/18 1543)  . magnesium sulfate 1 - 4 g bolus IVPB    . piperacillin-tazobactam (ZOSYN)  IV 3.375 g (01/01/18 1037)    LOS: 7 days   Kerney Elbe, DO [facility] PAGER is on [place]  If 7PM-7AM, please contact night-coverage [email or web address] Password [email or web address] 01/01/2018, 4:59 PM

## 2018-01-01 NOTE — Progress Notes (Signed)
Palliative care progress note  Met with patinet's husband in waiting area today.  He reports continuing to be hopeful that she will improve and is praying for a miracle.  We discussed trying to balance desire to be at home with continued interventions that require hospitalization.  He continues to be supportive of Cindy Faulkner's wishes and states that if she wants to stay in the hospital to "keep fighting" he support that, but if she decides that she is ready to transition home, he supports this as well.  I met today with Cindy Faulkner alone in her room.  She reports that nausea worsened after attempting to take pills yesterday (particularly augmentin).  She has been feeling worse since that time.    She has been transitioned back to IV regimen for abx and steroids today.  I changed back scheduled reglan to IV and increased dose to 40m before meals and at bedtime.  We gently discussed concern that if goal is to be at home, we may need to consider trying to transition home sooner rather than later as her condition continues to change. She reports understanding this, but that she feels as though staying in the hospital and seeing how she feels after being back on IV regimen is her best plan at this time.   Provided support and reassurance at bedside.  Palliative to follow up again with family tomorrow.  Time: 45 min Greater than 50%  of this time was spent counseling and coordinating care related to the above assessment and plan.  GMicheline Rough MD [place]outh [place] [phone]

## 2018-01-01 NOTE — Progress Notes (Signed)
Cindy Faulkner was awake and alert when I arrived. She said she had had a good day. Her husband was bedside and plans to spend the night. He remains very optimistic about Cindy Faulkner getting well and possibly coming back to work. He remains faithful to expecting a miracle. In a private conversation Cindy Faulkner also has strong faith and is also looking forward to going home. She said she wants to continue to fight whereas earlier this week she said she was tired. While Cindy Faulkner and I had a few minutes alone we talked about her going to her home. She said she knows she has to option to come back here should she get worse or becomes unable to respond. She said she would take that option or go to Hospice rather than stay home saying she does not want that to be a burden to her family and for them to have to take her out of the house if something happens. It was unclear how her family feels about that with the exception of her daughter. She said her family said she would not be a burden. She said [name] understood.  She wants to be with family but knows the possible outcome may mean going to be with Jesus. Today we (the three of [place]) sang songs of hymns and praise and worship. Cindy Faulkner joined in and knew all the words and songs we sang. They requested prayer following which we continued to sing. Please page if additional support is needed.  [place], MDiv   01/01/18 2000  Clinical Encounter Type  Visited With Patient and family together

## 2018-01-01 NOTE — Progress Notes (Signed)
Pharmacy Antibiotic Note  Cindy Faulkner is a 52 y.o. female admitted on 12/24/2017 with sepsis.  Pharmacy has been consulted for piperacillin/tazobactam.  Pt has PMH significant for metastatic squamous cell carcinoma - no chemotherapy since May 2019. Broad spectrum antibiotics for possible IAI changing from augmentin to Zosyn.   Today, 01/01/18  WBC 21.7 (on decadron)  Afebrile  SCr improving, 1.18 with CrCl ~65 ml/hr  Plan:  Zosyn 3.375 g EI q 8h  No dose adjustments anticipated, Pharmacy will sign off note writing, f/u peripherally via electronic surveilance   Height: 5\' 7"  (170.2 cm) Weight: 220 lb (99.8 kg) IBW/kg (Calculated) : 61.6  Temp (24hrs), Avg:98.2 F (36.8 C), Min:98.1 F (36.7 C), Max:98.3 F (36.8 C)  Recent Labs  Lab 12/26/17 1654 12/27/17 0509 12/28/17 0331 12/29/17 0344 12/30/17 0825 12/31/17 1051 01/01/18 0420  WBC  --  10.1 16.4* 18.8* 22.0* 21.2* 21.7*  CREATININE  --  1.08* 1.06* 1.10*  --  1.39* 1.18*  VANCORANDOM 16 18  --   --   --   --   --     Estimated Creatinine Clearance: 66.2 mL/min (A) (by C-G formula based on SCr of 1.18 mg/dL (H)).    No Known Allergies  Antimicrobials this admission: 11/3 piperaicillin/tazobactam >> 11/8, resume 11/10 11/3 vancomycin >> 11/5 11/8 augmentin >> 11/10   Dose adjustments this admission: 11/4 d/c vancomycin standing order for significantly increased SCr 11/4 @ 16:54 = 16 : Vanc 750mg  IV x 1 11/5 @ 0500 = 18, vanc 750mg  IV x 1, continue 750 mg iv q12h  Microbiology results: 11/3 BCx: NGF 11/3 UCx: NG (final) 11/3 C diff: negative 11/3 stool: negative  Thank you for allowing pharmacy to be a part of this patient's care.  Peggyann Juba, PharmD, BCPS Pager: [phone] 01/01/2018 9:55 AM

## 2018-01-01 NOTE — Progress Notes (Signed)
  Vital Signs MEWS/VS Documentation      12/31/2017 2014 12/31/2017 2049 01/01/2018 0527 01/01/2018 1353   MEWS Score:  2  1  1  4    MEWS Score Color:  Yellow  Green  Green  Red   Resp:  -  19  18  (!) 22   Pulse:  -  (!) 110  (!) 110  (!) 160   BP:  -  (!) 148/92  (!) 148/86  (!) 141/110   Temp:  -  98.2 F (36.8 C)  98.3 F (36.8 C)  -   O2 Device:  -  Room Air  Room Air  -   Level of Consciousness:  Alert  -  -  -      RN and CN aware of MEWS score. Patient is comfort care.     Wells Guiles A [place] 01/01/2018,4:08 PM

## 2018-01-01 NOTE — Progress Notes (Signed)
SLP Cancellation Note  Patient Details Name: Cindy Faulkner MRN: [id number] DOB: 11-27-1965   Cancelled treatment:       Reason Eval/Treat Not Completed: Patient declined, no reason specified. Pt/husband defer swallowing evaluation today. I spoke with husband who reports pt saw SLP in [facility] recently, who felt pt's swallowing was functional and difficulties are more "mental" (per husband); reportedly she has an aversion to swallowing after attempting a large pill yesterday. SLP will follow up next date at his request.  Deneise Lever, [place], [place] Pager: [phone] Office: [phone]    Aliene Altes 01/01/2018, 1:08 PM

## 2018-01-01 NOTE — Progress Notes (Signed)
Introduced self to patient and offered services to assist with CPAP.  Pt. Stated that she only needed a new bottle of sterile which I provided for her.  Will be available if she requires additional assistance.

## 2018-01-02 ENCOUNTER — Inpatient Hospital Stay: Payer: 59 | Admitting: Hematology

## 2018-01-02 ENCOUNTER — Inpatient Hospital Stay: Payer: 59

## 2018-01-02 LAB — COMPREHENSIVE METABOLIC PANEL
ALK PHOS: 152 U/L — AB (ref 38–126)
ALT: 21 U/L (ref 0–44)
ANION GAP: 12 (ref 5–15)
AST: 37 U/L (ref 15–41)
Albumin: 2.6 g/dL — ABNORMAL LOW (ref 3.5–5.0)
BUN: 19 mg/dL (ref 6–20)
CALCIUM: 9.2 mg/dL (ref 8.9–10.3)
CO2: 26 mmol/L (ref 22–32)
CREATININE: 1.07 mg/dL — AB (ref 0.44–1.00)
Chloride: 98 mmol/L (ref 98–111)
GFR calc Af Amer: >60 mL/min (ref >60)
GFR calc non Af Amer: 58 mL/min — ABNORMAL LOW (ref >60)
GLUCOSE: 171 mg/dL — AB (ref 70–99)
Potassium: 3.3 mmol/L — ABNORMAL LOW (ref 3.5–5.1)
SODIUM: 136 mmol/L (ref 135–145)
Total Bilirubin: 1.3 mg/dL — ABNORMAL HIGH (ref 0.3–1.2)
Total Protein: 5.7 g/dL — ABNORMAL LOW (ref 6.5–8.1)

## 2018-01-02 LAB — CBC WITH DIFFERENTIAL/PLATELET
Abs Immature Granulocytes: 0.19 10*3/uL — ABNORMAL HIGH (ref 0.00–0.07)
BASOS PCT: 0 %
Basophils Absolute: 0 10*3/uL (ref 0.0–0.1)
EOS PCT: 0 %
Eosinophils Absolute: 0 10*3/uL (ref 0.0–0.5)
HCT: 26.1 % — ABNORMAL LOW (ref 36.0–46.0)
HEMOGLOBIN: 8 g/dL — AB (ref 12.0–15.0)
Immature Granulocytes: 2 %
LYMPHS PCT: 11 %
Lymphs Abs: 1.3 10*3/uL (ref 0.7–4.0)
MCH: 28.4 pg (ref 26.0–34.0)
MCHC: 30.7 g/dL (ref 30.0–36.0)
MCV: 92.6 fL (ref 80.0–100.0)
MONO ABS: 0.3 10*3/uL (ref 0.1–1.0)
MONOS PCT: 3 %
Neutro Abs: 10.2 10*3/uL — ABNORMAL HIGH (ref 1.7–7.7)
Neutrophils Relative %: 84 %
Platelets: 8 10*3/uL — CL (ref 150–400)
RBC: 2.82 MIL/uL — AB (ref 3.87–5.11)
RDW: 18.6 % — ABNORMAL HIGH (ref 11.5–15.5)
WBC: 12 10*3/uL — AB (ref 4.0–10.5)
nRBC: 0 % (ref 0.0–0.2)

## 2018-01-02 LAB — GLUCOSE, CAPILLARY: Glucose-Capillary: 256 mg/dL — ABNORMAL HIGH (ref 70–99)

## 2018-01-02 LAB — PHOSPHORUS: Phosphorus: 3.1 mg/dL (ref 2.5–4.6)

## 2018-01-02 LAB — MAGNESIUM: Magnesium: 1.6 mg/dL — ABNORMAL LOW (ref 1.7–2.4)

## 2018-01-02 MED ORDER — POTASSIUM CHLORIDE 10 MEQ/100ML IV SOLN
10.0000 meq | INTRAVENOUS | Status: AC
  Administered 2018-01-02 (×4): 10 meq via INTRAVENOUS
  Filled 2018-01-02 (×4): qty 100

## 2018-01-02 MED ORDER — OXYCODONE HCL 5 MG PO TABS: 10.0000 mg | ORAL_TABLET | ORAL | Status: DC | PRN

## 2018-01-02 MED ORDER — MAGNESIUM SULFATE 2 GM/50ML IV SOLN
2.0000 g | Freq: Once | INTRAVENOUS | Status: AC
  Administered 2018-01-02: 2 g via INTRAVENOUS
  Filled 2018-01-02: qty 50

## 2018-01-02 NOTE — Progress Notes (Signed)
Palliative Care Progress Note  Met with Cindy Faulkner, her daughter, husband and niece Cindy Faulkner today to readdress goals of care and to provide ongoing symptom management and care coordination. Overall Cindy Faulkner has been gradually improving each day with some minor set backs during the hospitalization- but overall trend is stable and better. Her appetite has improved dramatically and she is eating very well for the first time in over 3 weeks. Her energy is improving and she has tolerated the Duragesic patch for pain without any issues. She gets nausea relief and sleeps much better with the Zyprexa at night. From symptom management standpoint I believe we have worked out a good plan for her- if we look at the "numbers" her prognosis remains very poor- we have been chasing very low platelets for days, rapidly falling magnesium and potassium levels, an unknown infection (felt to be possible intra-abdominal but not confirmed, low protein, dehydration and volume status have been a daily challenge). Over the weekend the plan was to begin to get ready for discharge home with hospice, with the intention being to give Cindy Faulkner as much quality time as possible instead of being in the hospital. Her cancer has been rapidly progressing and Cindy Faulkner discussed with her that she is too weak and fragile to tolerate the treatment options and even then there are no guarantees.Today, their goals have changed to a desire to continue to pursue this cancer and to remain in the hospital until they have all the information they need and have a solid plan moving forward-per her husband one that will help her recover.  As Cindy Faulkner's functional status and energy levels improve, her husband Cindy Faulkner and her niece Cindy Faulkner are insisting on more information and they need absolute reassurance that every possible avenue for potentially helpful treatments have been explored for Cindy Faulkner. They are searching the Internet and exploring all possible ways to intervene with her cancer and  want answers to the many unknowns about her condition-" why her platelets are so low, where is the infection, why are her mag and K dropping and why does she seem short of breath and have effusions in her lungs".  They want the lab information daily because they feel informed by those.They are not unreasonable but need to feel confident that she has gotten the very best care and expert opinion on her cancer- they enquired about a second opinion at a tertiary center today-Cindy Faulkner requested before they pursue tertiary centers, that she would like Cindy Faulkner to review her case and offer his advice. Cindy Faulkner knows how serious things are for her and does her daughter Cindy Faulkner who was attentive during our discussion today, but also worried about additional aggressive approaches.  Recommendations:  1. I called Cindy Faulkner as a courtesy to [facility] and at her request. He will review her chart and will weigh in on options, recommendations and will address their questions and concerns related to her cancer and hematology issues.   2. Stop IV antibiotics-she has had a full course for a variety of possible infections. Monitor off abx for now.  3. Maintain current pain medications and anti-nausea medication.  4. Will continue to treat her oral thrush for 14 days minmum to prevent reoccurrence.  5. Cancelled SLP -no issues  6. Nutrition is much improved. Unclear why she is losing Mag and K so rapidly.  Will continue to follow Cindy Faulkner closely.  Lane Hacker, DO Palliative Medicine [phone] (cell)  Time: 35 min Greater than 50%  of this time was  spent counseling and coordinating care related to the above assessment and plan.

## 2018-01-02 NOTE — Progress Notes (Deleted)
Electrophysiology Office Note Date: 01/02/2018  ID:  Cindy Faulkner, DOB 1965-07-21, MRN [id number]  PCP: Lennie Odor, PA-C Electrophysiologist: Lovena Le  CC: follow up  Cindy Faulkner is a 52 y.o. female seen today for Dr Lovena Le.  She presents today for routine electrophysiology followup.  Since last being seen in our clinic, the patient reports doing very well.  She denies chest pain, palpitations, dyspnea, PND, orthopnea, nausea, vomiting, dizziness, syncope, edema, weight gain, or early satiety.  Past Medical History:  Diagnosis Date  . Atrial fibrillation ([place])    a. echo 4/07: EF 60%  . Atrial tachycardia ([place])    a. s/p EPS 4/09: no inducible SVT - med Tx continued  . Cancer ([place])   . Complication of anesthesia    age 27yo, woke during procedure with GA, paralysed   . Endometrial polyp   . Hyperlipidemia   . Hypertension   . PONV (postoperative nausea and vomiting)   . Rectocele    WITHOUT MENTION OF UTERINE PROLAPSE  . Stroke ([place])   . SVD (spontaneous vaginal delivery)    x 1   Past Surgical History:  Procedure Laterality Date  . COLONOSCOPY    . HYSTEROSCOPY    . IR FLUORO GUIDE PORT INSERTION RIGHT  03/23/2017  . IR US GUIDE VASC ACCESS RIGHT  03/23/2017  . LEEP    . Pylonidal cystect    . TUBAL LIGATION    . WISDOM TOOTH EXTRACTION      No current facility-administered medications for this visit.    No current outpatient medications on file.   Facility-Administered Medications Ordered in Other Visits  Medication Dose Route Frequency Provider Last Rate Last Dose  . 0.9 %  sodium chloride infusion   Intravenous PRN Sheikh, Georgina Quint Latif, DO      . citalopram (CELEXA) tablet 10 mg  10 mg Oral Daily Lane Hacker L, DO   10 mg at 01/01/18 3419  . dexamethasone (DECADRON) injection 8 mg  8 mg Intravenous Q24H Sheikh, Georgina Quint Latif, DO   8 mg at 01/02/18 0800  . fentaNYL (DURAGESIC - dosed mcg/hr) 25 mcg  25 mcg Transdermal Q72H Lane Hacker L, DO    25 mcg at 12/30/17 1329  . fentaNYL (SUBLIMAZE) injection 12.5-25 mcg  12.5-25 mcg Intravenous Q1H PRN Acquanetta Chain, DO   25 mcg at 01/01/18 2154  . flecainide (TAMBOCOR) tablet 50 mg  50 mg Oral Daily Ivor Costa, MD   50 mg at 12/31/17 1100  . fluconazole (DIFLUCAN) IVPB 100 mg  100 mg Intravenous Q24H Raiford Noble [place], DO 50 mL/hr at 01/02/18 1115 100 mg at 01/02/18 1115  . furosemide (LASIX) injection 20 mg  20 mg Intravenous Daily Raiford Noble [place], DO   20 mg at 01/02/18 6222  . lidocaine-prilocaine (EMLA) cream 1 application  1 application Topical Daily PRN Ivor Costa, MD      . LORazepam (ATIVAN) injection 0.25 mg  0.25 mg Intravenous Q4H PRN Lane Hacker L, DO      . magic mouthwash w/lidocaine  15 mL Oral QID PRN Lane Hacker L, DO   15 mL at 12/30/17 1329  . metoCLOPramide (REGLAN) injection 10 mg  10 mg Intravenous TID AC & HS Micheline Rough, MD   10 mg at 01/02/18 1113  . OLANZapine zydis (ZYPREXA) disintegrating tablet 5 mg  5 mg Oral QHS Lane Hacker L, DO   5 mg at 01/01/18 2140  . ondansetron (ZOFRAN) injection 4 mg  4 mg Intravenous TID PRN Raiford Noble Latif, DO   4 mg at 12/31/17 1449  . oxyCODONE (Oxy IR/ROXICODONE) immediate release tablet 10 mg  10 mg Oral Q1H PRN Sheikh, Omair Latif, DO      . pantoprazole (PROTONIX) injection 40 mg  40 mg Intravenous Q24H SheikhGeorgina Quint [place], DO   40 mg at 01/02/18 5852  . piperacillin-tazobactam (ZOSYN) IVPB 3.375 g  3.375 g Intravenous [address] [place], DO 12.5 mL/hr at 01/02/18 0911 3.375 g at 01/02/18 0911  . potassium chloride 10 mEq in 100 mL IVPB  10 mEq Intravenous Q1 Hr x 4 SheikhGeorgina Quint [place], DO 100 mL/hr at 01/02/18 1245 10 mEq at 01/02/18 1245  . promethazine (PHENERGAN) injection 12.5 mg  12.5 mg Intravenous Q6H PRN Orson Eva, MD   12.5 mg at 12/26/17 0929  . protein supplement (PREMIER PROTEIN) liquid - approved for s/p bariatric surgery  11 oz Oral BID BM Ivor Costa, MD   11 oz at 12/28/17  0959  . sodium chloride flush (NS) 0.9 % injection 10-40 mL  10-40 mL Intracatheter PRN Orson Eva, MD   10 mL at 12/29/17 0352    Allergies:   Patient has no known allergies.   Social History: Social History   Socioeconomic History  . Marital status: Married    Spouse name: Kasandra Knudsen  . Number of children: 1  . Years of education: Not on file  . Highest education level: Not on file  Occupational History  . Occupation: nurse  Social Needs  . Financial resource strain: Not on file  . Food insecurity:    Worry: Not on file    Inability: Not on file  . Transportation needs:    Medical: Not on file    Non-medical: Not on file  Tobacco Use  . Smoking status: Former Smoker    Packs/day: 0.50    Years: 10.00    Pack years: 5.00    Types: Cigarettes    Last attempt to quit: 02/22/1998    Years since quitting: 19.8  . Smokeless tobacco: Never Used  Substance and Sexual Activity  . Alcohol use: No  . Drug use: No  . Sexual activity: Not on file  Lifestyle  . Physical activity:    Days per week: Not on file    Minutes per session: Not on file  . Stress: Not on file  Relationships  . Social connections:    Talks on phone: Not on file    Gets together: Not on file    Attends religious service: Not on file    Active member of club or organization: Not on file    Attends meetings of clubs or organizations: Not on file    Relationship status: Not on file  . Intimate partner violence:    Fear of current or ex partner: Not on file    Emotionally abused: Not on file    Physically abused: Not on file    Forced sexual activity: Not on file  Other Topics Concern  . Not on file  Social History Narrative  . Not on file    Family History: Family History  Problem Relation Age of Onset  . Hypertension Father   . Diabetes Father   . Stroke Father   . Hypertension Mother   . Stroke Mother   . Cancer Mother        cervical ca  . Cancer Sister 60       uterine ca  .  Cancer  Maternal Grandmother        lung ca    Review of Systems: All other systems reviewed and are otherwise negative except as noted above.   Physical Exam: VS:  LMP 02/12/2012  , BMI There is no height or weight on file to calculate BMI. Wt Readings from Last 3 Encounters:  12/24/17 220 lb (99.8 kg)  12/16/17 221 lb 3.2 oz (100.3 kg)  12/12/17 226 lb 3.2 oz (102.6 kg)    GEN- The patient is well appearing, alert and oriented x 3 today.   HEENT: normocephalic, atraumatic; sclera clear, conjunctiva pink; hearing intact; oropharynx clear; neck supple Lungs- Clear to ausculation bilaterally, normal work of breathing.  No wheezes, rales, rhonchi Heart- Regular rate and rhythm, no murmurs, rubs or gallops GI- soft, non-tender, non-distended, bowel sounds present Extremities- no clubbing, cyanosis, or edema; DP/PT/radial pulses 2+ bilaterally MS- no significant deformity or atrophy Skin- warm and dry, no rash or lesion  Psych- euthymic mood, full affect Neuro- strength and sensation are intact   EKG:  EKG is ordered today. The ekg ordered today shows ***  Recent Labs: 12/25/2017: TSH 2.932 01/02/2018: ALT 21; BUN 19; Creatinine, Ser 1.07; Hemoglobin 8.0; Magnesium 1.6; Platelets 8; Potassium 3.3; Sodium 136    Other studies Reviewed: Additional studies/ records that were reviewed today include:Dr Winchester office notes  Assessment and Plan:  1.  Paroxysmal atrial fibrillation/atrial tachycardia Doing well on Flecainide EKG unchanged Continue Xarelto for CHADS2VASC of 4  2.  HTN Stable No change required today    Current medicines are reviewed at length with the patient today.   The patient does not have concerns regarding her medicines.  The following changes were made today:  none  Labs/ tests ordered today include: none No orders of the defined types were placed in this encounter.    Disposition:   Follow up with Dr Lovena Le 1 year    Signed, Chanetta Marshall,  NP 01/02/2018 12:56 PM   [place] [address] [place] [address] [phone] (office) [phone] (fax)

## 2018-01-02 NOTE — Progress Notes (Signed)
PROGRESS NOTE    Cindy Faulkner  TOI:[id number] DOB: 10-26-1965 DOA: 12/24/2017 PCP: Lennie Odor, PA-C  Brief Narrative:  The patient is a 52 y.o.femalewith medical history significant ofmetastatic squamous cell carcinoma involving liver & sacrum with unknown primary site(stoped chemo in May),hypertension, hyperlipidemia, stroke, GERD, depression, atrial fibrillation on Xarelto, who presents with altered mental status.  According to the patient's husband, the patient has had a functional decline over the past 2 weeks, now having difficulty getting out of bed without assistance.  In the past 2 to 3 days prior to admission, the patient has had worsening oral intake and increasing lethargy.  The patient has had on and off low-grade temperatures for the past week with a T-max of 101.0 F approximately 3 days prior to this admission.  There has been no headaches, neck pain, chest pain, coughing, hemoptysis, diarrhea, dysuria, hematuria, hematochezia, melena.  Upon presentation, the patient was noted to have sodium 128 and WBC 24.1 and hemoglobin 8.0.  There is concern for infectious source.  Patient was started on empiric vancomycin and Zosyn. Pt admitted for further management.  Encephalopathy improved some but patient feels more SOB and distended. IVF discontinued and Paracentesis attempted but did not have enough fluid to tap. Palliative Care and Hematology/Oncology to re-evaluate and make further recommendations. After exentensive discussion with Hematology/Oncology and Palliative, Goals of Care are going to be shifted to Comfort Care. Palliative helping in the process of arranging Hospice and patient has elected Home Hospice. Dr. Hilma Favors of palliative discussed with the patient and she will be treated for few more days with an oral regimen of antibiotics and switch to Augmentin.  She also has oral thrush and she will be treated with fluconazole as well as Magic mouthwash.  Patient acutely worsened  and reported nausea which worsened after attempting to take pills and patient vomited yesterday.  Most of her p.o. medications will be changed back to IV form at this time as patient wants to stay in the hospital and see how things go and how she feels after being back on IV pain regimen.  Palliative continuing goals of care discussion with the patient and family.  Patient wants to continue the current regimen at this time and husband hopeful that she will get better and get back to work.  Assessment & Plan:   Principal Problem:   Acute metabolic encephalopathy Active Problems:   Essential hypertension   AF (paroxysmal atrial fibrillation) (HCC)   Cerebral thrombosis with cerebral infarction   Stroke (cerebrum) (HCC)   Metastasis to liver (HCC)   Metastatic squamous cell carcinoma involving liver & sacrum with unknown primary site    Hypokalemia   Anemia due to antineoplastic chemotherapy   MDD (major depressive disorder), recurrent severe, without psychosis ([place])   Severe protein-calorie malnutrition (HCC)   Sepsis (HCC)   Hyponatremia   Thrombocytopenia (HCC)  Acute metabolic encephalopathy likely 2/2 worsening metastatic CA with possible sepsis of unknown origin; improved  -Multifactorial including infectious etiology, hyponatremia, dehydration, pancytopenia, cancer of unknown origin ?Cervical Ca as primary -Currently afebrile, with improved leukocytosis -Urinalysis negative for pyuria, UC with no growth -Stool culture for C.diff negative -BC X 2, NGTD at 5 days  -Chest x-ray personally reviewed--no consolidations -Personally reviewed EKG--sinus rhythm, no ST-T wave changes -CT head negative for any mets -Continued empiric IV Zosyn but now changed to po Augmentin patient unable to tolerate p.o. keep anything down due to vomiting so we will change back to IV Zosyn. D/C'd  IV Vancomycin as no MRSA growing; she is on day 8 of 10 for IV antibiotics. -Palliative consulted due to very poor  prognosis, made DNR -Appreciate further evaluation and recommendations by Palliative; they are taking it day by day and continue to monitor patient's progress and readiness to transition home with home hospice.  At this time she wants to continue IV regimen and see how she feels rather than transitioning home to home hospice currently -Family requested Repeat CT Scan for confirmation of disease and prognostication and was done last night -Showed Metastatic Disease   Coagulopathy/Thrombocytopenia -No signs of active bleeding -Likely due to sepsis resulting in ??DIC from Cancer -Presenting INR 3.66, PTT 51, fibrinogen 226, DAT negative -D-dimer 14.95 -S/P transfused 2 units PRBC, s/p 1U of platelet 12/27/17 -Hematology/oncology on board and to re-evaluate today -Platelet Count now 8 and  but Hb/Hct now is 8.0/26.1 -Repeat CBC in AM   Intractable nausea/vomiting/abdominal distension/fullness -12/23/2017 CT abdomen and pelvis--interval progression of malignancy with progression of liver metastasis; new abdominal ascites with loculated fluid over the liver and peritoneal nodule in the right upper quadrant; new right upper lobe and right lower lobe lung nodule, multiple splenic and renal infarcts -Scheduled reglan, zyprexa for nausea, decadron as per palliative and Decadron dose has been reduced and is now on 8 mg po Daily but had to be changed back to IV given her nausea and vomiting -Palliative recommending therapeutic/comfort paracentesis. IR consulted, unable to do 12/27/17 due to coagulopathy and unable to do today as not enough fluid -Tried IV Lasix 40 mg daily for the the last 2 days and this is helped with her abdominal distention.  Will transition to p.o. Lasix as creatinine worsening and place the patient on 40 mg of Lasix daily however patient unable to tolerate p.o. at this time so changes IV 20 mg daily -Pain management with fentanyl changed to Dilaudid and adde po Oxycodone but changed  again by Palliative in hopes of transitioning home; She is now on oxycodone 10 mg sublingually every 1 hour as needed for breakthrough pain changed to p.o. oxycodone and with IV fentanyl 12.5 to 25 mcg IV q. one hour PRN for moderate pain -Palliative is also added a fentanyl patch 25 mcg transdermally every 72 hours -Patient states that pain is well controlled today -Palliative following and appreciate GOC discussion and likely transition to home with hospice sometime next week -Patient was unable to tolerate p.o. yesterday all her medications were changed back to IV and palliative care has changed metoclopramide 10 mg IV 3 times daily before meals and bedtime.   -She is feeling better today with current regimen able to tolerate food and ate a chicken biscuit.  Cancer of unknown origin likely ?cervical CA -Follows Dr. Alvy Bimler -Has mets to liver and sacrum which have worsened  -last Keytruda 2-3 weeks prior to admission -Holding off all chemo for now and her conditioning is such that she may not tolerate even salvage Chemo -Repeat CT Scan done at family request last night to show disease progression and prognostication -Palliative and Oncology working with patient and family and GOC is transitioning towards comfort care -Palliative helping arrange Hospice as Patient is going to transition home with Home Hospice; further discussion with Palliative Dr. Domingo Cocking revealed that the patient feels that she "wants to try to go with home with home hospice care but feels need to ensure that things are stable before doing so. ... Plan is to transition home with beginning a week  was very short of a stable regimen that can be duplicated at home."  Paroxysmal Atrial Fibrillation -Holding Rivaroxaban secondary to thrombocytopenia -Continue Flecainide 50 mg po Daily for now if able -Presently in sinus rhythm but tachycardic   Hypokalemia/Hypomagnesemia -Patient's potassium this AM was 3.3 and Mag Level was  1.6 -Replete -Continue monitor and replete as necessary -Repeat CMP in AM   Hyponatremia -due to poor oral intake and dehydration, improved -Discontinued IVF due to Volume Overload  -Given IV Lasix 40 mg Daily x2 days and transitioned to po but unable to tolerate so will give IV 20 mg Daily  -Na+ back to 136 -Repeat CMP in AM   Anemia due to antineoplastic chemotherapy -This is likely anemia of chronic disease -Serum X32 elevated, folic acid level low -Iron 25, sats 23, ferritin 4,414 -Hb/Hct now stable at 8.0/26.1 -Continue to Monitor for S/Sx of Bleeding  -Repeat CBC in AM   MDD (major depressive disorder), recurrent severe, without psychosis ([place]): -Continue Citalopram 20 mg po Daily if able -C/w Olanzapine 2.5 mg po qHS  Severe Protein-Calorie Malnutrition  -Appreciate Nutritionist Recc's -C/w Protein Supplement Liquid 11 ounces BID   Leukocytosis -The setting of IV steroid demargination with Decadron use; WBC trended up to 22.0 and is now 12.0 -Now on 8 mg of po Daily Dexamethasone but had to change to IV due to Nausea and Vomiting -Continue monitor for signs and symptoms of infection -Continued with empiric antibiotics with IV Zosyn and changed to po Augmentin but patient unable to tolerate so changed back to IV Zosyn -Repeat CBC in AM   AKI -Kidney Function is worsening and BUN/Cr went from 9/0.50 -> 22/1.39  -> 20/1.18 -> 19/1.07 -Worsened by IV Lasix and IV Zosyn probably -Zosyn changed to Augmenting and Lasix changed to po but unable to tolerate so changed back to IV -Repeat CMP in AM   DVT prophylaxis: SCDs due to Thrombocytopenia Code Status: DO NOT RESUSCITATE Family Communication: Discussed with family at bedside Disposition Plan: Likely Home with Hospice likely versus continued treatment.  Patient undecided about what she wants to do and currently wants to continue treatment and see how clinical course goes.  Consultants:  Palliative  Care Hematology Hospice   Procedures:  Attempted Paracentesis    Antimicrobials:  Anti-infectives (From admission, onward)   Start     Dose/Rate Route Frequency Ordered Stop   01/01/18 1130  fluconazole (DIFLUCAN) IVPB 100 mg     100 mg 50 mL/hr over 60 Minutes Intravenous Every 24 hours 01/01/18 1108     01/01/18 1000  piperacillin-tazobactam (ZOSYN) IVPB 3.375 g     3.375 g 12.5 mL/hr over 240 Minutes Intravenous Every 8 hours 01/01/18 0959 01/04/18 2359   12/30/17 2200  amoxicillin-clavulanate (AUGMENTIN) 500-125 MG per tablet 500 mg  Status:  Discontinued     1 tablet Oral Every 12 hours 12/30/17 1232 01/01/18 0938   12/30/17 1300  fluconazole (DIFLUCAN) tablet 100 mg  Status:  Discontinued     100 mg Oral Daily 12/30/17 1229 01/01/18 1108   12/27/17 1900  vancomycin (VANCOCIN) IVPB 750 mg/150 ml premix  Status:  Discontinued     750 mg 150 mL/hr over 60 Minutes Intravenous Every 12 hours 12/27/17 1043 12/27/17 1416   12/27/17 0630  vancomycin (VANCOCIN) IVPB 750 mg/150 ml premix     750 mg 150 mL/hr over 60 Minutes Intravenous  Once 12/27/17 0624 12/27/17 0745   12/26/17 1800  vancomycin (VANCOCIN) IVPB 750 mg/150 ml premix  Status:  Discontinued     750 mg 150 mL/hr over 60 Minutes Intravenous Every 12 hours 12/26/17 1737 12/26/17 1741   12/26/17 1800  vancomycin (VANCOCIN) IVPB 750 mg/150 ml premix     750 mg 150 mL/hr over 60 Minutes Intravenous  Once 12/26/17 1742 12/26/17 1906   12/26/17 1800  vancomycin variable dose per unstable renal function (pharmacist dosing)  Status:  Discontinued      Does not apply See admin instructions 12/26/17 1747 12/27/17 1043   12/26/17 0852  vancomycin variable dose per unstable renal function (pharmacist dosing)  Status:  Discontinued      Does not apply See admin instructions 12/26/17 0853 12/26/17 1737   12/25/17 1600  vancomycin (VANCOCIN) IVPB 750 mg/150 ml premix  Status:  Discontinued     750 mg 150 mL/hr over 60 Minutes  Intravenous Every 12 hours 12/25/17 0824 12/26/17 0853   12/25/17 1200  vancomycin (VANCOCIN) IVPB 750 mg/150 ml premix  Status:  Discontinued     750 mg 150 mL/hr over 60 Minutes Intravenous Every 8 hours 12/25/17 0358 12/25/17 0824   12/25/17 1000  piperacillin-tazobactam (ZOSYN) IVPB 3.375 g  Status:  Discontinued     3.375 g 12.5 mL/hr over 240 Minutes Intravenous Every 8 hours 12/25/17 0349 12/30/17 1232   12/25/17 0215  vancomycin (VANCOCIN) 2,000 mg in sodium chloride 0.9 % 500 mL IVPB     2,000 mg 250 mL/hr over 120 Minutes Intravenous  Once 12/25/17 0154 12/25/17 0504   12/25/17 0200  piperacillin-tazobactam (ZOSYN) IVPB 3.375 g     3.375 g 100 mL/hr over 30 Minutes Intravenous NOW 12/25/17 0154 12/25/17 0333     Subjective: Seen and examined at bedside states that she had a good morning.  She was hungry and was able to eat a chicken biscuit with no issues.  I have any metabolic candy and she tolerated that well.  No chest pain, lightheadedness or dizziness.  Does not want to change her medications back to p.o. as she is worried that she will vomit them up again and wants to continue IV treatment.  No other concerns or quads at this time  Objective: Vitals:   01/01/18 0527 01/01/18 1353 01/01/18 2106 01/02/18 0456  BP: (!) 148/86 (!) 141/110 (!) 142/74 118/77  Pulse: (!) 110 (!) 160 86 82  Resp: 18 (!) 22 19 19   Temp: 98.3 F (36.8 C)  98.2 F (36.8 C) 98.3 F (36.8 C)  TempSrc: Oral  Oral Oral  SpO2: 94% 96% 97% 94%  Weight:      Height:        Intake/Output Summary (Last 24 hours) at 01/02/2018 2011 Last data filed at 01/02/2018 1706 Gross per 24 hour  Intake 630.09 ml  Output 2600 ml  Net -1969.91 ml   Filed Weights   12/24/17 2104  Weight: 99.8 kg   Examination: Physical Exam:  Constitutional: Well-nourished, well-developed obese Caucasian female currently no acute distress who is awake alert and feeling better today. Eyes: Conjunctive are normal.  Sclera  anicteric ENMT: External ears and nose appear normal.  Grossly normal hearing Neck: Supple no JVD Respiratory: Diminished to auscultation bilaterally no appreciable wheezing, rales, rhonchi.  Patient was not tachypneic or using any accessory muscles to breathe Cardiovascular: Tachycardic rate but slightly irregular.  No appreciable murmurs, rubs, gallops.  Has mild lower extremity edema Abdomen: Soft, slightly tender to palpate.  Abdomen is distended.  Bowel sounds present GU: Deferred Musculoskeletal: No contractures or cyanosis.  No joint family noted Skin: Skin is warm and dry no appreciable rashes or lesions limited skin evaluation.  Has some trace lower extremity edema Neurologic: Cranial nerves II through XII grossly intact no appreciable focal deficits Psychiatric: Awake, alert and oriented x3 feet as intact judgment insight.  Pleasant mood and affect  Data Reviewed: I have personally reviewed following labs and imaging studies  CBC: Recent Labs  Lab 12/28/17 0331 12/29/17 0344 12/30/17 0825 12/31/17 1051 01/01/18 0420 01/02/18 0432  WBC 16.4* 18.8* 22.0* 21.2* 21.7* 12.0*  NEUTROABS 14.0* 16.0*  --  18.7* 18.9* 10.2*  HGB 7.8* 8.2* 8.2* 8.1* 8.5* 8.0*  HCT 26.3* 27.4* 27.6* 27.5* 28.0* 26.1*  MCV 92.6 94.2 95.5 93.5 93.6 92.6  PLT 31* 18* 14* 18* 11* 8*   Basic Metabolic Panel: Recent Labs  Lab 12/27/17 0509 12/28/17 0331 12/29/17 0344 12/31/17 1051 01/01/18 0420 01/02/18 0432  NA 132* 136 136 134* 133* 136  K 4.0 3.7 3.6 3.5 3.5 3.3*  CL 100 106 103 102 99 98  CO2 22 19* 21* 20* 23 26  GLUCOSE 197* 166* 132* 171* 156* 171*  BUN 12 14 14  22* 20 19  CREATININE 1.08* 1.06* 1.10* 1.39* 1.18* 1.07*  CALCIUM 8.1* 8.4* 9.0 8.7* 9.0 9.2  MG 1.0* 2.6* 1.9  --  1.2* 1.6*  PHOS 3.1 2.9 3.2  --  3.3 3.1   GFR: Estimated Creatinine Clearance: 73 mL/min (A) (by C-G formula based on SCr of 1.07 mg/dL (H)). Liver Function Tests: Recent Labs  Lab 12/28/17 0331  12/29/17 0344 12/31/17 1051 01/01/18 0420 01/02/18 0432  AST  --  40 40 36 37  ALT  --  21 23 21 21   ALKPHOS  --  231* 188* 181* 152*  BILITOT  --  1.4* 1.1 1.7* 1.3*  PROT  --  5.9* 5.6* 5.7* 5.7*  ALBUMIN 2.5* 3.1* 2.7* 2.7* 2.6*   No results for input(s): LIPASE, AMYLASE in the last 168 hours. No results for input(s): AMMONIA in the last 168 hours. Coagulation Profile: Recent Labs  Lab 12/27/17 0509 12/28/17 1111  INR 2.89 2.27   Cardiac Enzymes: No results for input(s): CKTOTAL, CKMB, CKMBINDEX, TROPONINI in the last 168 hours. BNP (last 3 results) No results for input(s): PROBNP in the last 8760 hours. HbA1C: No results for input(s): HGBA1C in the last 72 hours. CBG: Recent Labs  Lab 12/28/17 2104 12/29/17 0735 12/31/17 1419 12/31/17 2052 01/01/18 2103  GLUCAP 149* 120* 148* 182* 310*   Lipid Profile: No results for input(s): CHOL, HDL, LDLCALC, TRIG, CHOLHDL, LDLDIRECT in the last 72 hours. Thyroid Function Tests: No results for input(s): TSH, T4TOTAL, FREET4, T3FREE, THYROIDAB in the last 72 hours. Anemia Panel: No results for input(s): VITAMINB12, FOLATE, FERRITIN, TIBC, IRON, RETICCTPCT in the last 72 hours. Sepsis Labs: No results for input(s): PROCALCITON, LATICACIDVEN in the last 168 hours.  Recent Results (from the past 240 hour(s))  Culture, blood (Routine X 2) w Reflex to ID Panel     Status: None   Collection Time: 12/25/17  2:19 AM  Result Value Ref Range Status   Specimen Description   Final    BLOOD RIGHT PORTA CATH Performed at [facility] [address] [place], [place] [address]    Special Requests   Final    BOTTLES DRAWN AEROBIC AND ANAEROBIC Blood Culture adequate volume Performed at [facility] [address], [place],  [address]    Culture   Final  NO GROWTH 5 DAYS Performed at [facility], [place] [address] [place], [place] [address]    Report Status 12/30/2017 FINAL   Final  Culture, blood (Routine X 2) w Reflex to ID Panel     Status: None   Collection Time: 12/25/17  2:19 AM  Result Value Ref Range Status   Specimen Description   Final    BLOOD LEFT FOREARM Performed at [facility] [address], [place], [place] [address]    Special Requests   Final    BOTTLES DRAWN AEROBIC AND ANAEROBIC Blood Culture adequate volume Performed at [facility] [address] [place], [place] [address]    Culture   Final    NO GROWTH 5 DAYS Performed at [facility], [place] [address], [place], [place] [address]    Report Status 12/30/2017 FINAL  Final  Urine Culture     Status: None   Collection Time: 12/25/17  6:30 AM  Result Value Ref Range Status   Specimen Description   Final    URINE, CLEAN CATCH Performed at [facility], [place] [address], [place], [place] [address]    Special Requests   Final    NONE Performed at [facility], [place] [address], [place], [place] [address]    Culture   Final    NO GROWTH Performed at [facility], [place] [address], [place], [place] [address]    Report Status 12/26/2017 FINAL  Final  C difficile quick scan w PCR reflex     Status: None   Collection Time: 12/25/17  3:47 PM  Result Value Ref Range Status   C Diff antigen NEGATIVE NEGATIVE Final   C Diff toxin NEGATIVE NEGATIVE Final   C Diff interpretation No C. difficile detected.  Final    Comment: Performed at [facility], [place] [address], [place], [place] [address]  Stool culture (children & immunocomp patients)     Status: None   Collection Time: 12/25/17  3:47 PM  Result Value Ref Range Status   Salmonella/Shigella Screen Final report  Final   Campylobacter Culture Final report  Final   E coli, Shiga toxin Assay Negative Negative Final    Comment: (NOTE) Performed At: [facility] [place], [place] [id number] Rush Farmer MD  [id number]   STOOL CULTURE REFLEX - RSASHR     Status: None   Collection Time: 12/25/17  3:47 PM  Result Value Ref Range Status   Stool Culture result 1 (RSASHR) Comment  Final    Comment: (NOTE) No Salmonella or Shigella recovered. Performed At: [facility] [address] [place], [place] [id number] Rush Farmer MD [id number]   STOOL CULTURE Reflex - CMPCXR     Status: None   Collection Time: 12/25/17  3:47 PM  Result Value Ref Range Status   Stool Culture result 1 (CMPCXR) Comment  Final    Comment: (NOTE) No Campylobacter species isolated. Performed At: [facility] [place], [place] [id number] Rush Farmer MD OM:[id number]     Radiology Studies: No results found.  Scheduled Meds: . citalopram  10 mg Oral Daily  . dexamethasone  8 mg Intravenous Q24H  . fentaNYL  25 mcg Transdermal Q72H  . flecainide  50 mg Oral Daily  . furosemide  20 mg Intravenous Daily  . metoCLOPramide (REGLAN) injection  10 mg Intravenous TID AC & HS  . OLANZapine zydis  5  mg Oral QHS  . pantoprazole (PROTONIX) IV  40 mg Intravenous Q24H  . protein supplement shake  11 oz Oral BID BM   Continuous Infusions: . sodium chloride    . fluconazole (DIFLUCAN) IV Stopped (01/02/18 1215)  . piperacillin-tazobactam (ZOSYN)  IV 3.375 g (01/02/18 1706)    LOS: 8 days   Kerney Elbe, DO [facility] PAGER is on [place]  If 7PM-7AM, please contact night-coverage [email or web address] Password [email or web address] 01/02/2018, 8:11 PM

## 2018-01-03 ENCOUNTER — Inpatient Hospital Stay (HOSPITAL_COMMUNITY): Payer: 59

## 2018-01-03 ENCOUNTER — Ambulatory Visit: Payer: Self-pay | Admitting: Nurse Practitioner

## 2018-01-03 LAB — COMPREHENSIVE METABOLIC PANEL
ALBUMIN: 2.6 g/dL — AB (ref 3.5–5.0)
ALT: 24 U/L (ref 0–44)
AST: 45 U/L — AB (ref 15–41)
Alkaline Phosphatase: 205 U/L — ABNORMAL HIGH (ref 38–126)
Anion gap: 12 (ref 5–15)
BUN: 21 mg/dL — AB (ref 6–20)
CHLORIDE: 96 mmol/L — AB (ref 98–111)
CO2: 27 mmol/L (ref 22–32)
Calcium: 9.2 mg/dL (ref 8.9–10.3)
Creatinine, Ser: 0.97 mg/dL (ref 0.44–1.00)
GFR calc Af Amer: >60 mL/min (ref >60)
GFR calc non Af Amer: >60 mL/min (ref >60)
GLUCOSE: 125 mg/dL — AB (ref 70–99)
POTASSIUM: 3.3 mmol/L — AB (ref 3.5–5.1)
Sodium: 135 mmol/L (ref 135–145)
Total Bilirubin: 1.4 mg/dL — ABNORMAL HIGH (ref 0.3–1.2)
Total Protein: 5.7 g/dL — ABNORMAL LOW (ref 6.5–8.1)

## 2018-01-03 LAB — CBC WITH DIFFERENTIAL/PLATELET
Abs Immature Granulocytes: 0.31 10*3/uL — ABNORMAL HIGH (ref 0.00–0.07)
BASOS ABS: 0 10*3/uL (ref 0.0–0.1)
BASOS PCT: 0 %
EOS ABS: 0 10*3/uL (ref 0.0–0.5)
Eosinophils Relative: 0 %
HCT: 26.7 % — ABNORMAL LOW (ref 36.0–46.0)
Hemoglobin: 8.2 g/dL — ABNORMAL LOW (ref 12.0–15.0)
IMMATURE GRANULOCYTES: 2 %
Lymphocytes Relative: 7 %
Lymphs Abs: 1.5 10*3/uL (ref 0.7–4.0)
MCH: 28.4 pg (ref 26.0–34.0)
MCHC: 30.7 g/dL (ref 30.0–36.0)
MCV: 92.4 fL (ref 80.0–100.0)
Monocytes Absolute: 0.8 10*3/uL (ref 0.1–1.0)
Monocytes Relative: 4 %
NEUTROS PCT: 87 %
NRBC: 0.1 % (ref 0.0–0.2)
Neutro Abs: 18.5 10*3/uL — ABNORMAL HIGH (ref 1.7–7.7)
PLATELETS: 10 10*3/uL — AB (ref 150–400)
RBC: 2.89 MIL/uL — ABNORMAL LOW (ref 3.87–5.11)
RDW: 19 % — AB (ref 11.5–15.5)
WBC: 21.2 10*3/uL — ABNORMAL HIGH (ref 4.0–10.5)

## 2018-01-03 LAB — MAGNESIUM: Magnesium: 1.5 mg/dL — ABNORMAL LOW (ref 1.7–2.4)

## 2018-01-03 LAB — PHOSPHORUS: Phosphorus: 2.9 mg/dL (ref 2.5–4.6)

## 2018-01-03 LAB — GLUCOSE, CAPILLARY: Glucose-Capillary: 173 mg/dL — ABNORMAL HIGH (ref 70–99)

## 2018-01-03 LAB — TECHNOLOGIST SMEAR REVIEW

## 2018-01-03 MED ORDER — SIMETHICONE 40 MG/0.6ML PO SUSP
80.0000 mg | Freq: Once | ORAL | Status: AC
  Administered 2018-01-03: 80 mg via ORAL
  Filled 2018-01-03: qty 1.2

## 2018-01-03 MED ORDER — MAGNESIUM SULFATE 50 % IJ SOLN
3.0000 g | Freq: Once | INTRAVENOUS | Status: AC
  Administered 2018-01-03: 3 g via INTRAVENOUS
  Filled 2018-01-03: qty 6

## 2018-01-03 MED ORDER — POTASSIUM CHLORIDE 10 MEQ/100ML IV SOLN
10.0000 meq | INTRAVENOUS | Status: AC
  Administered 2018-01-03 (×5): 10 meq via INTRAVENOUS
  Filled 2018-01-03 (×5): qty 100

## 2018-01-03 NOTE — Progress Notes (Signed)
PROGRESS NOTE    Cindy Faulkner  RDE:[id number] DOB: 06-18-1965 DOA: 12/24/2017 PCP: Lennie Odor, PA-C  Brief Narrative:  The patient is a 52 y.o.femalewith medical history significant ofmetastatic squamous cell carcinoma involving liver & sacrum with unknown primary site(stoped chemo in May),hypertension, hyperlipidemia, stroke, GERD, depression, atrial fibrillation on Xarelto, who presents with altered mental status.  According to the patient's husband, the patient has had a functional decline over the past 2 weeks, now having difficulty getting out of bed without assistance.  In the past 2 to 3 days prior to admission, the patient has had worsening oral intake and increasing lethargy.  The patient has had on and off low-grade temperatures for the past week with a T-max of 101.0 F approximately 3 days prior to this admission.  There has been no headaches, neck pain, chest pain, coughing, hemoptysis, diarrhea, dysuria, hematuria, hematochezia, melena.  Upon presentation, the patient was noted to have sodium 128 and WBC 24.1 and hemoglobin 8.0.  There is concern for infectious source.  Patient was started on empiric vancomycin and Zosyn. Pt admitted for further management.  Encephalopathy improved some but patient feels more SOB and distended. IVF discontinued and Paracentesis attempted but did not have enough fluid to tap. Palliative Care and Hematology/Oncology to re-evaluate and make further recommendations. After exentensive discussion with Hematology/Oncology and Palliative, Goals of Care are going to be shifted to Comfort Care. Palliative helping in the process of arranging Hospice and patient has elected Home Hospice. Dr. Hilma Favors of palliative discussed with the patient and she will be treated for few more days with an oral regimen of antibiotics and switch to Augmentin.  She also has oral thrush and she will be treated with fluconazole as well as Magic mouthwash.  Patient acutely worsened  and reported nausea which worsened after attempting to take pills and patient vomited yesterday.  Most of her p.o. medications will be changed back to IV form at this time as patient wants to stay in the hospital and see how things go and how she feels after being back on IV pain regimen.  Palliative continuing goals of care discussion with the patient and family.  Patient wants to continue the current regimen at this time and husband hopeful that she will get better and get back to work.  Patient and family want an opinion by Dr. Benay Spice and are hopeful that she will get better.  Patient wants to continue IV medications but did not want p.o. medications because she thinks she will throw up.  I discussed with the patient that we cannot discharge her home on IV medications that she will need to attempt and she will think about doing this the next few days.  Assessment & Plan:   Principal Problem:   Acute metabolic encephalopathy Active Problems:   Essential hypertension   AF (paroxysmal atrial fibrillation) (HCC)   Cerebral thrombosis with cerebral infarction   Stroke (cerebrum) (HCC)   Metastasis to liver (HCC)   Metastatic squamous cell carcinoma involving liver & sacrum with unknown primary site    Hypokalemia   Anemia due to antineoplastic chemotherapy   MDD (major depressive disorder), recurrent severe, without psychosis ([place])   Severe protein-calorie malnutrition (HCC)   Sepsis (HCC)   Hyponatremia   Thrombocytopenia (HCC)  Acute metabolic encephalopathy likely 2/2 worsening metastatic CA with possible sepsis of unknown origin; improved  -Multifactorial including infectious etiology, hyponatremia, dehydration, pancytopenia, cancer of unknown origin ?Cervical Ca as primary -Currently afebrile, with improved leukocytosis -  Urinalysis negative for pyuria, UC with no growth -Stool culture for C.diff negative -BC X 2, NGTD at 5 days  -Chest x-ray personally reviewed--no  consolidations -Personally reviewed EKG--sinus rhythm, no ST-T wave changes -CT head negative for any mets -IV Zosyn now discontinued by palliative -Appreciate further evaluation and recommendations by Palliative; they are taking it day by day and continue to monitor patient's progress and readiness to transition home with home hospice.  At this time she wants to continue IV regimen and see how she feels rather than transitioning home to home hospice currently -Family requested Repeat CT Scan for confirmation of disease and prognostication and was done last night -Showed Metastatic Disease   Coagulopathy/Thrombocytopenia -No signs of active bleeding -Likely due to sepsis resulting in ??DIC from Cancer -Presenting INR 3.66, PTT 51, fibrinogen 226, DAT negative -D-dimer 14.95 -S/P transfused 2 units PRBC, s/p 1U of platelet 12/27/17 -Hematology/oncology on board and to re-evaluate today -Platelet Count now 10 and  but Hb/Hct now is 8.2/26.7 -Repeat CBC in AM   Intractable nausea/vomiting/abdominal distension/fullness -12/23/2017 CT abdomen and pelvis--interval progression of malignancy with progression of liver metastasis; new abdominal ascites with loculated fluid over the liver and peritoneal nodule in the right upper quadrant; new right upper lobe and right lower lobe lung nodule, multiple splenic and renal infarcts -Scheduled reglan, zyprexa for nausea, decadron as per palliative and Decadron dose has been reduced and is now on 8 mg po Daily but had to be changed back to IV given her nausea and vomiting -Palliative recommending therapeutic/comfort paracentesis. IR consulted, unable to do 12/27/17 due to coagulopathy and unable to do today as not enough fluid -Tried IV Lasix 40 mg daily for the the last 2 days and this is helped with her abdominal distention.  Will transition to p.o. Lasix as creatinine worsening and place the patient on 40 mg of Lasix daily however patient unable to tolerate  p.o. at this time so changes IV 20 mg daily and will continue for now but positive discussed with the patient about stopping and patient has elected to continue it as it is helped her respiratory status and kidney function -Pain management with fentanyl changed to Dilaudid and adde po Oxycodone but changed again by Palliative in hopes of transitioning home; She is now on oxycodone 10 mg sublingually every 1 hour as needed for breakthrough pain changed to p.o. oxycodone and with IV fentanyl 12.5 to 25 mcg IV q. one hour PRN for moderate pain -Palliative is also added a fentanyl patch 25 mcg transdermally every 72 hours -Patient states that pain is well controlled today -Palliative following and appreciate [place] discussion and likely transition to home with hospice sometime next week -Patient was unable to tolerate p.o. 01/01/2018 and all her medications were changed back to IV and palliative care has changed metoclopramide 10 mg IV 3 times daily before meals and bedtime.   -She is feeling better yesterday but was nauseous today.  Does not want to attempt to take p.o. medications again at this time.  Cancer of unknown origin likely ?cervical CA -Follows Dr. Alvy Bimler -Has mets to liver and sacrum which have worsened  -last Keytruda 2-3 weeks prior to admission -Holding off all chemo for now and her conditioning is such that she may not tolerate even salvage Chemo -Repeat CT Scan done at family request last night to show disease progression and prognostication -Palliative and Oncology working with patient and family and GOC is transitioning towards comfort care -Palliative was  helping arrange Hospice as Patient was going to transition home with Home Hospice; further discussion with Palliative Dr. Domingo Cocking revealed that the patient feels that she "wants to try to go with home with home hospice care but feels need to ensure that things are stable before doing so. ... Plan is to transition home with beginning a  week was very short of a stable regimen that can be duplicated at home." -Family and patient have not changed her mind and did not want hospice currently I want to pursue every avenue possible including possible tertiary care center and second opinion.  Before this he would like to have Dr. Julieanne Manson weigh in  Paroxysmal Atrial Fibrillation -Holding Rivaroxaban secondary to thrombocytopenia -Continue Flecainide 50 mg po Daily for now if able -Presently in sinus rhythm but tachycardic   Hypokalemia/Hypomagnesemia -Patient's potassium this AM was 3.3 and Mag Level was 1.6 -Need to replete -Continue monitor and replete as necessary -Repeat CMP in AM   Hyponatremia -due to poor oral intake and dehydration, improved -Discontinued IVF due to Volume Overload  -Transitioned to IV Lasix mill grams p.o. daily -Na+ back to 136 -Repeat CMP in AM   Anemia due to antineoplastic chemotherapy -This is likely anemia of chronic disease -Serum N23 elevated, folic acid level low -Iron 25, sats 23, ferritin 4,414 -Hb/Hct now stable at 8.2/26.7 -Continue to Monitor for S/Sx of Bleeding  -Repeat CBC in AM   MDD (major depressive disorder), recurrent severe, without psychosis ([place]): -Continue Citalopram 20 mg po Daily if able -C/w Olanzapine 2.5 mg po qHS  Severe Protein-Calorie Malnutrition  -Appreciate Nutritionist Recc's -C/w Protein Supplement Liquid 11 ounces BID  -Nutritionist is encouraging the family to bring food and fax the patient would like  Leukocytosis -The setting of IV steroid demargination with Decadron use; WBC trended up to 22.0 and is now 21.2 -Now on 8 mg of po Daily Dexamethasone but had to change to IV due to Nausea and Vomiting -Continue monitor for signs and symptoms of infection -Continued with empiric antibiotics with IV Zosyn and changed to po Augmentin but patient unable to tolerate so changed back to IV Zosyn which is now been discontinued by palliative  care -Repeat CBC in AM   AKI improving -Kidney Function is worsening and BUN/Cr went from 9/0.50 -> 22/1.39  -> 20/1.18 -> 19/1.07 -> 21/0.97 -Initially worsened by IV Lasix and IV Zosyn probably patient had a lot of volume overload -Zosyn changed to Augmentin and Lasix changed to po but unable to tolerate so changed back to IV -6 doses low dose of 20 mg IV and patient's volume status is actually improved -Repeat CMP in AM   DVT prophylaxis: SCDs due to Thrombocytopenia Code Status: DO NOT RESUSCITATE Family Communication: Discussed with family at bedside and husband at bedside Disposition Plan: Initial plan was to go home with home hospice however patient wants to pursue every apnea continue to be treated aggressively.  Consultants:  Palliative Care Hematology Hospice   Procedures:  Attempted Paracentesis    Antimicrobials:  Anti-infectives (From admission, onward)   Start     Dose/Rate Route Frequency Ordered Stop   01/01/18 1130  fluconazole (DIFLUCAN) IVPB 100 mg     100 mg 50 mL/hr over 60 Minutes Intravenous Every 24 hours 01/01/18 1108     01/01/18 1000  piperacillin-tazobactam (ZOSYN) IVPB 3.375 g  Status:  Discontinued     3.375 g 12.5 mL/hr over 240 Minutes Intravenous Every 8 hours 01/01/18 0959 01/02/18  2029   12/30/17 2200  amoxicillin-clavulanate (AUGMENTIN) 500-125 MG per tablet 500 mg  Status:  Discontinued     1 tablet Oral Every 12 hours 12/30/17 1232 01/01/18 0938   12/30/17 1300  fluconazole (DIFLUCAN) tablet 100 mg  Status:  Discontinued     100 mg Oral Daily 12/30/17 1229 01/01/18 1108   12/27/17 1900  vancomycin (VANCOCIN) IVPB 750 mg/150 ml premix  Status:  Discontinued     750 mg 150 mL/hr over 60 Minutes Intravenous Every 12 hours 12/27/17 1043 12/27/17 1416   12/27/17 0630  vancomycin (VANCOCIN) IVPB 750 mg/150 ml premix     750 mg 150 mL/hr over 60 Minutes Intravenous  Once 12/27/17 0624 12/27/17 0745   12/26/17 1800  vancomycin (VANCOCIN) IVPB  750 mg/150 ml premix  Status:  Discontinued     750 mg 150 mL/hr over 60 Minutes Intravenous Every 12 hours 12/26/17 1737 12/26/17 1741   12/26/17 1800  vancomycin (VANCOCIN) IVPB 750 mg/150 ml premix     750 mg 150 mL/hr over 60 Minutes Intravenous  Once 12/26/17 1742 12/26/17 1906   12/26/17 1800  vancomycin variable dose per unstable renal function (pharmacist dosing)  Status:  Discontinued      Does not apply See admin instructions 12/26/17 1747 12/27/17 1043   12/26/17 0852  vancomycin variable dose per unstable renal function (pharmacist dosing)  Status:  Discontinued      Does not apply See admin instructions 12/26/17 0853 12/26/17 1737   12/25/17 1600  vancomycin (VANCOCIN) IVPB 750 mg/150 ml premix  Status:  Discontinued     750 mg 150 mL/hr over 60 Minutes Intravenous Every 12 hours 12/25/17 0824 12/26/17 0853   12/25/17 1200  vancomycin (VANCOCIN) IVPB 750 mg/150 ml premix  Status:  Discontinued     750 mg 150 mL/hr over 60 Minutes Intravenous Every 8 hours 12/25/17 0358 12/25/17 0824   12/25/17 1000  piperacillin-tazobactam (ZOSYN) IVPB 3.375 g  Status:  Discontinued     3.375 g 12.5 mL/hr over 240 Minutes Intravenous Every 8 hours 12/25/17 0349 12/30/17 1232   12/25/17 0215  vancomycin (VANCOCIN) 2,000 mg in sodium chloride 0.9 % 500 mL IVPB     2,000 mg 250 mL/hr over 120 Minutes Intravenous  Once 12/25/17 0154 12/25/17 0504   12/25/17 0200  piperacillin-tazobactam (ZOSYN) IVPB 3.375 g     3.375 g 100 mL/hr over 30 Minutes Intravenous NOW 12/25/17 0154 12/25/17 0333     Subjective: Seen and examined at bedside and states that she is a little bit nauseous this morning feeling slightly dyspneic.  No chest pain, lightheadedness or dizziness.  Did have some abdominal discomfort earlier.  No other concerns or complaints at this time and wants to continue IV medications and does not attempt p.o. medications at this time.  Objective: Vitals:   01/02/18 0456 01/02/18 2025  01/03/18 1044 01/03/18 1348  BP: 118/77 (!) 144/82 131/80 132/83  Pulse: 82 92 (!) 101 93  Resp: 19 20 19 18   Temp: 98.3 F (36.8 C) 98.5 F (36.9 C) (!) 97.5 F (36.4 C) (!) 97.5 F (36.4 C)  TempSrc: Oral Oral Oral Oral  SpO2: 94% 99% 91% 96%  Weight:      Height:        Intake/Output Summary (Last 24 hours) at 01/03/2018 1956 Last data filed at 01/03/2018 1500 Gross per 24 hour  Intake 1019.18 ml  Output -  Net 1019.18 ml   Filed Weights   12/24/17 2104  Weight: 99.8 kg   Examination: Physical Exam:  Constitutional: Well-nourished, well-developed obese Caucasian female currently no acute distress appears slightly nauseous and mildly uncomfortable but says she is doing okay Eyes: Lids and conjunctive are normal.  Sclera anicteric. ENMT: External ears and nose appear normal.  Grossly normal hearing Neck: Supple no JVD Respiratory: Diminished to auscultation bilaterally no appreciable wheezing, rales, rhonchi.  Patient not tachypneic wheezing excess muscle breathe Cardiovascular: Slightly tachycardic but no appreciable murmurs, rubs, gallops.  Has trace lower extremity edema today Abdomen: Soft, slightly tender to palpate.  Abdomen is mildly distended.  Bowel sounds present all 4 quadrants, GU: Deferred Musculoskeletal: No contractures cyanosis.  No joint deformities noted Skin: Skin is warm and dry no appreciable rashes or lesions limited skin evaluation Neurologic: Cranial II through XII grossly intact no appreciable focal deficits Psychiatric: Is awake, alert and oriented x3.  Intact judgment insight.  Patient has a normal mood and affect  Data Reviewed: I have personally reviewed following labs and imaging studies  CBC: Recent Labs  Lab 12/29/17 0344 12/30/17 0825 12/31/17 1051 01/01/18 0420 01/02/18 0432 01/03/18 0511  WBC 18.8* 22.0* 21.2* 21.7* 12.0* 21.2*  NEUTROABS 16.0*  --  18.7* 18.9* 10.2* 18.5*  HGB 8.2* 8.2* 8.1* 8.5* 8.0* 8.2*  HCT 27.4* 27.6*  27.5* 28.0* 26.1* 26.7*  MCV 94.2 95.5 93.5 93.6 92.6 92.4  PLT 18* 14* 18* 11* 8* 10*   Basic Metabolic Panel: Recent Labs  Lab 12/28/17 0331 12/29/17 0344 12/31/17 1051 01/01/18 0420 01/02/18 0432 01/03/18 0511  NA 136 136 134* 133* 136 135  K 3.7 3.6 3.5 3.5 3.3* 3.3*  CL 106 103 102 99 98 96*  CO2 19* 21* 20* 23 26 27   GLUCOSE 166* 132* 171* 156* 171* 125*  BUN 14 14 22* 20 19 21*  CREATININE 1.06* 1.10* 1.39* 1.18* 1.07* 0.97  CALCIUM 8.4* 9.0 8.7* 9.0 9.2 9.2  MG 2.6* 1.9  --  1.2* 1.6* 1.5*  PHOS 2.9 3.2  --  3.3 3.1 2.9   GFR: Estimated Creatinine Clearance: 80.5 mL/min (by C-G formula based on SCr of 0.97 mg/dL). Liver Function Tests: Recent Labs  Lab 12/29/17 0344 12/31/17 1051 01/01/18 0420 01/02/18 0432 01/03/18 0511  AST 40 40 36 37 45*  ALT 21 23 21 21 24   ALKPHOS 231* 188* 181* 152* 205*  BILITOT 1.4* 1.1 1.7* 1.3* 1.4*  PROT 5.9* 5.6* 5.7* 5.7* 5.7*  ALBUMIN 3.1* 2.7* 2.7* 2.6* 2.6*   No results for input(s): LIPASE, AMYLASE in the last 168 hours. No results for input(s): AMMONIA in the last 168 hours. Coagulation Profile: Recent Labs  Lab 12/28/17 1111  INR 2.27   Cardiac Enzymes: No results for input(s): CKTOTAL, CKMB, CKMBINDEX, TROPONINI in the last 168 hours. BNP (last 3 results) No results for input(s): PROBNP in the last 8760 hours. HbA1C: No results for input(s): HGBA1C in the last 72 hours. CBG: Recent Labs  Lab 12/31/17 1419 12/31/17 2052 01/01/18 2103 01/02/18 2027 01/03/18 1311  GLUCAP 148* 182* 310* 256* 173*   Lipid Profile: No results for input(s): CHOL, HDL, LDLCALC, TRIG, CHOLHDL, LDLDIRECT in the last 72 hours. Thyroid Function Tests: No results for input(s): TSH, T4TOTAL, FREET4, T3FREE, THYROIDAB in the last 72 hours. Anemia Panel: No results for input(s): VITAMINB12, FOLATE, FERRITIN, TIBC, IRON, RETICCTPCT in the last 72 hours. Sepsis Labs: No results for input(s): PROCALCITON, LATICACIDVEN in the last 168  hours.  Recent Results (from the past 240 hour(s))  Culture, blood (  Routine X 2) w Reflex to ID Panel     Status: None   Collection Time: 12/25/17  2:19 AM  Result Value Ref Range Status   Specimen Description   Final    BLOOD RIGHT PORTA CATH Performed at [facility] [address], [place], [place] [address]    Special Requests   Final    BOTTLES DRAWN AEROBIC AND ANAEROBIC Blood Culture adequate volume Performed at [facility] [address], [place], [place] [address]    Culture   Final    NO GROWTH 5 DAYS Performed at [facility], [place] [address], [place], [place] [address]    Report Status 12/30/2017 FINAL  Final  Culture, blood (Routine X 2) w Reflex to ID Panel     Status: None   Collection Time: 12/25/17  2:19 AM  Result Value Ref Range Status   Specimen Description   Final    BLOOD LEFT FOREARM Performed at [facility] [address], [place], [place] [address]    Special Requests   Final    BOTTLES DRAWN AEROBIC AND ANAEROBIC Blood Culture adequate volume Performed at [facility] [address], [place], [place] [address]    Culture   Final    NO GROWTH 5 DAYS Performed at [facility], [place] [address], [place], [place] [address]    Report Status 12/30/2017 FINAL  Final  Urine Culture     Status: None   Collection Time: 12/25/17  6:30 AM  Result Value Ref Range Status   Specimen Description   Final    URINE, CLEAN CATCH Performed at [facility], [place] [address], [place], [place] [address]    Special Requests   Final    NONE Performed at [facility], [place] [address] [place], [place] [address]    Culture   Final    NO GROWTH Performed at [facility], [place] [address], [place], [place] [address]    Report Status 12/26/2017 FINAL  Final  C difficile quick scan w PCR reflex     Status: None   Collection Time: 12/25/17   3:47 PM  Result Value Ref Range Status   C Diff antigen NEGATIVE NEGATIVE Final   C Diff toxin NEGATIVE NEGATIVE Final   C Diff interpretation No C. difficile detected.  Final    Comment: Performed at [facility], [place] [address], [place], [place] [address]  Stool culture (children & immunocomp patients)     Status: None   Collection Time: 12/25/17  3:47 PM  Result Value Ref Range Status   Salmonella/Shigella Screen Final report  Final   Campylobacter Culture Final report  Final   E coli, Shiga toxin Assay Negative Negative Final    Comment: (NOTE) Performed At: [facility] [place], [place] [id number] Rush Farmer MD [id number]   STOOL CULTURE REFLEX - RSASHR     Status: None   Collection Time: 12/25/17  3:47 PM  Result Value Ref Range Status   Stool Culture result 1 (RSASHR) Comment  Final    Comment: (NOTE) No Salmonella or Shigella recovered. Performed At: [facility] [address] [place], [place] [id number] Rush Farmer MD [id number]   STOOL CULTURE Reflex - CMPCXR     Status: None   Collection Time: 12/25/17  3:47 PM  Result Value Ref Range Status   Stool Culture result 1 (CMPCXR)  Comment  Final    Comment: (NOTE) No Campylobacter species isolated. Performed At: [facility] [place], [place] [id number] Rush Farmer MD [id number]     Radiology Studies: No results found.  Scheduled Meds: . citalopram  10 mg Oral Daily  . dexamethasone  8 mg Intravenous Q24H  . fentaNYL  25 mcg Transdermal Q72H  . flecainide  50 mg Oral Daily  . furosemide  20 mg Intravenous Daily  . metoCLOPramide (REGLAN) injection  10 mg Intravenous TID AC & HS  . OLANZapine zydis  5 mg Oral QHS  . pantoprazole (PROTONIX) IV  40 mg Intravenous Q24H  . protein supplement shake  11 oz Oral BID BM   Continuous Infusions: . sodium chloride    . fluconazole (DIFLUCAN) IV 100 mg (01/03/18 1155)    LOS: 9  days   Kerney Elbe, DO [facility] PAGER is on [place]  If 7PM-7AM, please contact night-coverage [email or web address] Password [facility] 01/03/2018, 7:56 PM

## 2018-01-03 NOTE — Progress Notes (Signed)
Nutrition Follow-up  DOCUMENTATION CODES:   Non-severe (moderate) malnutrition in context of chronic illness, Obesity unspecified  INTERVENTION:    Continue Premier Protein if pt asks, each supplement provides 160kcal and 30g protein.   Continue to encourage family to bring foods and snacks that patient would like  NUTRITION DIAGNOSIS:   Moderate Malnutrition related to cancer and cancer related treatments, chronic illness as evidenced by percent weight loss, energy intake < or equal to 75% for > or equal to 1 month.  Ongoing  GOAL:   Patient will meet greater than or equal to 90% of their needs  Progressing slowly  MONITOR:   PO intake, Supplement acceptance, Weight trends, Labs, I & O's  REASON FOR ASSESSMENT:   Malnutrition Screening Tool    ASSESSMENT:   52 y.o. female with medical history significant of metastatic squamous cell carcinoma involving liver & sacrum with unknown primary site (stoped chemo in May), hypertension, hyperlipidemia, stroke, GERD, depression, atrial fibrillation on Xarelto, who presents with altered mental status.   Pt started on lasix for increased work of breathing.   Pt's appetite has slowly progressed over the last two days. She was able to eat sausage, eggs, and grits for the first time in three months per husband. She is craving popsicles but was told by the kitchen they could not been delivered. They have popsicles on the unit, relayed this to nursing to provide her one when she asks. She hasn't consumed much Premier as they fill her up quickly. Offered support to her and family. Asked pt is she needed any additional snacks or supplements. She declined. Will continue to monitor.   Per palliative, the "family's goals have changed to a desire to continue to pursue this cancer and to remain in the hospital until they have all the information they need and have a solid plan moving forward" They are speaking with Dr. Benay Spice for possible  recommendations.   Medications reviewed and include: decadron, lasix once daily, Mg sulfate, 10 mEq KCl Labs reviewed: K 3.3 (L) Mg 1.5 (L)  Diet Order:   Diet Order            Diet regular Room service appropriate? Yes; Fluid consistency: Thin  Diet effective now              EDUCATION NEEDS:   No education needs have been identified at this time  Skin:  Skin Assessment: Reviewed RN Assessment  Last BM:  01/02/18  Height:   Ht Readings from Last 1 Encounters:  12/24/17 5\' 7"  (1.702 m)    Weight:   Wt Readings from Last 1 Encounters:  12/24/17 99.8 kg    Ideal Body Weight:  61.4 kg  BMI:  Body mass index is 34.46 kg/m.  Estimated Nutritional Needs:   Kcal:  2100-2300  Protein:  90-100g  Fluid:  2.1L/day  Mariana Single RD, LDN Clinical Nutrition Pager # [phone]

## 2018-01-03 NOTE — Progress Notes (Signed)
   01/03/18 1000  Clinical Encounter Type  Visited With Family  Visit Type Follow-up;Psychological support;Spiritual support  Referral From Family  Consult/Referral To Chaplain  Spiritual Encounters  Spiritual Needs Emotional;Other (Comment) (Spiritual Care Conversation/Support)  Stress Factors  Family Stress Factors Health changes;Major life changes   I visited briefly with the patient's husband. The past few times that I have seen him he remains positive about [name]'s condition and about her prognosis. He is relying on his spiritual belief system of "God working miracles" to help him cope with pam's condition. He stated that [name] had been eating better and feeling better. This gives him hope for a better outcome to [name]'s condition.  I continue to provide Spiritual support for he, [name] and their family as they are going through this.  I will follow-up.  Please, contact Spiritual Care for further assistance.   Chaplain Shanon Ace M.Div., [facility]

## 2018-01-03 NOTE — Progress Notes (Signed)
Cindy Faulkner was resting when I arrived. Her oldest niece was bedside. She said she was doing ok today. We talked about what a wonderful woman she is. Her niece stated that she raised her. Our visit was brief as I did not want to disturb Cindy Faulkner's rest. Please page if there are any changes or if support is needed.  [place], MDiv   01/03/18 2100  Clinical Encounter Type  Visited With Family

## 2018-01-04 DIAGNOSIS — G893 Neoplasm related pain (acute) (chronic): Secondary | ICD-10-CM

## 2018-01-04 DIAGNOSIS — R627 Adult failure to thrive: Secondary | ICD-10-CM

## 2018-01-04 DIAGNOSIS — I480 Paroxysmal atrial fibrillation: Secondary | ICD-10-CM

## 2018-01-04 DIAGNOSIS — D6481 Anemia due to antineoplastic chemotherapy: Secondary | ICD-10-CM

## 2018-01-04 DIAGNOSIS — E871 Hypo-osmolality and hyponatremia: Secondary | ICD-10-CM

## 2018-01-04 DIAGNOSIS — D649 Anemia, unspecified: Secondary | ICD-10-CM

## 2018-01-04 DIAGNOSIS — T451X5A Adverse effect of antineoplastic and immunosuppressive drugs, initial encounter: Secondary | ICD-10-CM

## 2018-01-04 DIAGNOSIS — E876 Hypokalemia: Secondary | ICD-10-CM

## 2018-01-04 LAB — CBC WITH DIFFERENTIAL/PLATELET
Abs Immature Granulocytes: 0.37 10*3/uL — ABNORMAL HIGH (ref 0.00–0.07)
BASOS ABS: 0 10*3/uL (ref 0.0–0.1)
Basophils Relative: 0 %
Eosinophils Absolute: 0.1 10*3/uL (ref 0.0–0.5)
Eosinophils Relative: 0 %
HEMATOCRIT: 26.4 % — AB (ref 36.0–46.0)
HEMOGLOBIN: 8 g/dL — AB (ref 12.0–15.0)
Immature Granulocytes: 2 %
LYMPHS ABS: 1.5 10*3/uL (ref 0.7–4.0)
LYMPHS PCT: 7 %
MCH: 28.1 pg (ref 26.0–34.0)
MCHC: 30.3 g/dL (ref 30.0–36.0)
MCV: 92.6 fL (ref 80.0–100.0)
MONO ABS: 0.9 10*3/uL (ref 0.1–1.0)
Monocytes Relative: 4 %
NEUTROS ABS: 17.6 10*3/uL — AB (ref 1.7–7.7)
Neutrophils Relative %: 87 %
Platelets: 8 10*3/uL — CL (ref 150–400)
RBC: 2.85 MIL/uL — AB (ref 3.87–5.11)
RDW: 19 % — ABNORMAL HIGH (ref 11.5–15.5)
WBC: 20.4 10*3/uL — AB (ref 4.0–10.5)
nRBC: 0.2 % (ref 0.0–0.2)

## 2018-01-04 LAB — COMPREHENSIVE METABOLIC PANEL
ALK PHOS: 197 U/L — AB (ref 38–126)
ALT: 24 U/L (ref 0–44)
AST: 45 U/L — ABNORMAL HIGH (ref 15–41)
Albumin: 2.7 g/dL — ABNORMAL LOW (ref 3.5–5.0)
Anion gap: 12 (ref 5–15)
BUN: 24 mg/dL — ABNORMAL HIGH (ref 6–20)
CALCIUM: 9.1 mg/dL (ref 8.9–10.3)
CO2: 27 mmol/L (ref 22–32)
Chloride: 94 mmol/L — ABNORMAL LOW (ref 98–111)
Creatinine, Ser: 0.95 mg/dL (ref 0.44–1.00)
GFR calc Af Amer: >60 mL/min (ref >60)
GFR calc non Af Amer: >60 mL/min (ref >60)
GLUCOSE: 138 mg/dL — AB (ref 70–99)
Potassium: 4 mmol/L (ref 3.5–5.1)
SODIUM: 133 mmol/L — AB (ref 135–145)
Total Bilirubin: 1.5 mg/dL — ABNORMAL HIGH (ref 0.3–1.2)
Total Protein: 5.6 g/dL — ABNORMAL LOW (ref 6.5–8.1)

## 2018-01-04 LAB — GLUCOSE, CAPILLARY: Glucose-Capillary: 256 mg/dL — ABNORMAL HIGH (ref 70–99)

## 2018-01-04 LAB — MAGNESIUM: Magnesium: 1.6 mg/dL — ABNORMAL LOW (ref 1.7–2.4)

## 2018-01-04 LAB — PHOSPHORUS: Phosphorus: 3.1 mg/dL (ref 2.5–4.6)

## 2018-01-04 MED ORDER — MAGNESIUM SULFATE 4 GM/100ML IV SOLN
4.0000 g | Freq: Once | INTRAVENOUS | Status: AC
  Administered 2018-01-04: 4 g via INTRAVENOUS
  Filled 2018-01-04: qty 100

## 2018-01-04 NOTE — Progress Notes (Signed)
IP PROGRESS NOTE  Subjective:   Cindy Faulkner was diagnosed with metastatic squamous cell carcinoma of unknown primary earlier this year.  She was admitted on 12/25/2017 with altered mental status and failure to thrive. I was asked by the palliative care service and Cindy Faulkner to evaluate her for another oncology opinion. She was initially treated with Taxol/carboplatin chemotherapy and radiation to a pelvic mass.  There was clinical improvement following this therapy.  She was treated with maintenance bevacizumab beginning in June.  She remained on bevacizumab until a CT scan showed progression of liver metastases on 10/20/2017.  Treatment was changed to pembrolizumab beginning 10/31/2017.  She has completed 3 treatments with the most recent cycle given on 12/12/2017. She reports increased generalized weakness and confusion prior to hospital admission.  The weakness persists. Restaging CTs on 12/23/2017 revealed progression of liver metastases, new ascites, and new pulmonary nodules.  Stable left pelvic sidewall tumor with erosive changes of the left sacrum. She was noted to have thrombocytopenia and severe anemia on hospital admission.  The thrombocytopenia and anemia persist.  Cindy Faulkner reports mild abdominal pain.  She has chronic numbness in the left leg following radiation treatment of the pelvic mass.  Objective: Vital signs in last 24 hours: Blood pressure 132/83, pulse 93, temperature (!) 97.5 F (36.4 C), temperature source Oral, resp. rate 18, height 5\' 7"  (1.702 m), weight 220 lb (99.8 kg), last menstrual period 02/12/2012, SpO2 96 %.  Intake/Output from previous day: 11/12 0701 - 11/13 0700 In: 1019.2 [P.O.:430; IV Piggyback:589.2] Out: -   Physical Exam:  HEENT: No thrush or bleeding Lungs: Clear anteriorly, no respiratory distress Cardiac: Regular rate and rhythm Abdomen: Mildly distended, the liver is palpable in the right abdomen, tender over the liver Extremities: No leg  edema Skin: Ecchymosis at the left arm, left leg, and abdominal wall  Portacath/PICC-without erythema  Lab Results: Recent Labs    01/03/18 0511 01/04/18 0430  WBC 21.2* 20.4*  HGB 8.2* 8.0*  HCT 26.7* 26.4*  PLT 10* 8*    BMET Recent Labs    01/03/18 0511 01/04/18 0430  NA 135 133*  K 3.3* 4.0  CL 96* 94*  CO2 27 27  GLUCOSE 125* 138*  BUN 21* 24*  CREATININE 0.97 0.95  CALCIUM 9.2 9.1    Peripheral blood smear 01/03/2018: The polychromasia is increased.  Few teardrops, helmets, and schistocytes.  The white cells are increased in number.  The majority of the white cells are mature neutrophils.  There are bands and a rare myelocyte.  The platelets are markedly decreased in number.  No platelet clumps.  Some of the platelets are large.  Studies/Results: Dg Chest Port 1 View  Result Date: 01/03/2018 CLINICAL DATA:  Short of breath EXAM: PORTABLE CHEST 1 VIEW COMPARISON:  12/28/2017 FINDINGS: Right jugular Port-A-Cath is stable with its tip at the cavoatrial junction. Large right pleural effusion and basilar consolidation. Left lung is clear. No pneumothorax. IMPRESSION: Right pleural effusion and basilar consolidation are not significantly changed. Electronically Signed   By: Marybelle Killings M.D.   On: 01/03/2018 20:07    Medications: I have reviewed the patient's current medications.  Assessment/Plan:  1.  Metastatic squamous cell carcinoma of unknown primary- disease progression documented during this admission following recent pembrolizumab therapy 2.  Pain secondary to #1 3.  Deconditioning/failure to thrive 4.  Anemia/thrombocytopenia 5.  Leukocytosis 6.  Coagulopathy 7.  Malnutrition   Cindy Faulkner has metastatic squamous cell carcinoma of unknown primary.  She has been treated with multiple systemic therapies including Taxol/carboplatin, bevacizumab, and pembrolizumab.  She is currently admitted with failure to thrive.  I have a low clinical suspicion for a systemic  infection.  I suspect the leukocytosis is related to a "leukemoid "reaction to the metastatic tumor burden.  The severe anemia and thrombocytopenia are most likely related to cancer related DIC, though she could have bone marrow involvement with tumor.  The anemia is also secondary to chronic disease.  I discussed the prognosis and treatment options with Cindy Faulkner and her husband.  She understands no therapy will be curative.  There was an initial response with Taxol/carboplatin chemotherapy and the disease did not progress while on chemotherapy.  If she had a good performance status and adequate hematologic parameters I would consider treatment with weekly Taxol or weekly cisplatin.  However she is at increased risk for toxicity with chemotherapy due to her poor performance status and the severe thrombocytopenia.  She is not ambulatory and it would be difficult for her to return to the Cancer center for treatment.  She and her husband will consider her options.  I will return for further discussion on 01/05/2018.  LOS: 10 days   Betsy Coder, MD   01/04/2018, 3:27 PM

## 2018-01-04 NOTE — Progress Notes (Signed)
PROGRESS NOTE    Cindy Faulkner  XTG:[id number] DOB: 05-Feb-1966 DOA: 12/24/2017 PCP: Lennie Odor, PA-C    Brief Narrative:  The patient is a 52 y.o.femalewith medical history significant ofmetastatic squamous cell carcinoma involving liver & sacrum with unknown primary site(stoped chemo in May),hypertension, hyperlipidemia, stroke, GERD, depression, atrial fibrillation on Xarelto, who presents with altered mental status. According to the patient's husband, the patient has had a functional decline over the past 2 weeks, now having difficulty getting out of bed without assistance. In the past 2 to 3 days prior to admission, the patient has had worsening oral intake and increasing lethargy. The patient has had on and off low-grade temperatures for the past week with a T-max of 101.0 F approximately 3 days prior to this admission. There has been no headaches, neck pain, chest pain, coughing, hemoptysis, diarrhea, dysuria, hematuria, hematochezia, melena. Upon presentation, the patient was noted to have sodium 128 and WBC 24.1 and hemoglobin 8.0. There is concern for infectious source. Patient was started on empiric vancomycin and Zosyn. Pt admitted for further management.  Encephalopathy improved some but patient feels more SOB and distended. IVF discontinued and Paracentesis attempted but did not have enough fluid to tap. Palliative Care and Hematology/Oncology to re-evaluate and make further recommendations. After exentensive discussion with Hematology/Oncology and Palliative, Goals of Care are going to be shifted to Comfort Care. Palliative helping in the process of arranging Hospice and patient has elected Home Hospice. Dr. Hilma Favors of palliative discussed with the patient and she will be treated for few more days with an oral regimen of antibiotics and switch to Augmentin.  She also has oral thrush and she will be treated with fluconazole as well as Magic mouthwash.  Patient acutely  worsened and reported nausea which worsened after attempting to take pills and patient vomited yesterday.  Most of her p.o. medications will be changed back to IV form at this time as patient wants to stay in the hospital and see how things go and how she feels after being back on IV pain regimen.  Palliative continuing goals of care discussion with the patient and family.  Patient wants to continue the current regimen at this time and husband hopeful that she will get better and get back to work.  Patient and family want an opinion by Dr. Benay Spice and are hopeful that she will get better.  Patient wants to continue IV medications but did not want p.o. medications because she thinks she will throw up.  I discussed with the patient that we cannot discharge her home on IV medications that she will need to attempt and she will think about doing this the next few days.    Assessment & Plan:   Principal Problem:   Acute metabolic encephalopathy Active Problems:   Essential hypertension   AF (paroxysmal atrial fibrillation) (HCC)   Cerebral thrombosis with cerebral infarction   Stroke (cerebrum) (HCC)   Metastasis to liver (HCC)   Metastatic squamous cell carcinoma involving liver & sacrum with unknown primary site    Hypokalemia   Anemia due to antineoplastic chemotherapy   MDD (major depressive disorder), recurrent severe, without psychosis ([place])   Severe protein-calorie malnutrition (HCC)   Sepsis (HCC)   Hyponatremia   Thrombocytopenia (HCC)   Acute metabolic encephalopathy likely 2/2 worsening metastatic CA with possible sepsis of unknown origin; improved  -Multifactorial including infectious etiology, hyponatremia, dehydration, pancytopenia, cancer of unknown origin ?Cervical Ca as primary -Currently afebrile, withimprovedleukocytosis -Urinalysis negative for pyuria,  UC with no growth -Stool culturefor C.diff negative -BC X 2, NGTD at 5 days  -Chest x-ray reviewed--no  consolidations -Personally reviewed EKG--sinus rhythm, no ST-T wave changes -CT head negative for any mets -IV Zosyn now discontinued by palliative -Appreciate further evaluation and recommendations by Palliative; they are taking it day by day and continue to monitor patient's progress and readiness to transition home with home hospice.  At this time she wants to continue IV regimen and see how she feels rather than transitioning home to home hospice currently -Family requested Repeat CT Scan for confirmation of disease and prognostication and was done which showed progression of metastatic disease.  -Oncology and palliative care following.  Coagulopathy/Thrombocytopenia -No signs of active bleeding -Likely due to sepsis resulting in ??DIC from Cancer -Presenting INR 3.66, PTT 51, fibrinogen 226, DAT negative -D-dimer 14.95 -S/P transfused 2 units PRBC, s/p 1U of platelet 12/27/17 -Hematology/oncology on board and following.  -Platelet Count now 8 and  but Hb/Hct now is 8.0/26.4 -Repeat CBC in AM   Intractable nausea/vomiting/abdominal distension/fullness -12/23/2017 CT abdomen and pelvis--interval progression of malignancy with progression of liver metastasis; new abdominal ascites with loculated fluid over the liver and peritoneal nodule in the right upper quadrant; new right upper lobe and right lower lobe lung nodule, multiple splenic and renal infarcts -Scheduled reglan, zyprexa for nausea, decadron as per palliative and Decadron dose has been reduced and is now on 8 mg po Daily but had to be changed back to IV given her nausea and vomiting -Palliative recommending therapeutic/comfort paracentesis. IR consulted, unable to do 12/27/17 due to coagulopathy and unable to do today as not enough fluid -Tried IV Lasix 40 mg daily for the the last 2 days and this is helped with her abdominal distention.  Will transition to p.o. Lasix as creatinine worsening and place the patient on 40 mg of Lasix  daily however patient unable to tolerate p.o. at this time so changes IV 20 mg daily and will continue for now but positive discussed with the patient about stopping and patient has elected to continue it as it is helped her respiratory status and kidney function -Pain management with fentanyl changed to Dilaudid and adde po Oxycodone but changed again by Palliative in hopes of transitioning home; She is now on oxycodone 10 mg sublingually every 1 hour as needed for breakthrough pain changed to p.o. oxycodone and with IV fentanyl 12.5 to 25 mcg IV q. one hour PRN for moderate pain -Palliative is also added a fentanyl patch 25 mcg transdermally every 72 hours -Patient states that pain is well controlled today -Palliative following and appreciate [place] discussion and likely transition to home with hospice sometime next week -Patient was unable to tolerate p.o. 01/01/2018 and all her medications were changed back to IV and palliative care has changed metoclopramide 10 mg IV 3 times daily before meals and bedtime.   -She is feeling better yesterday but was nauseous today.  Does not want to attempt to take p.o. medications again at this time.  Cancer of unknown origin likely ?cervical CA -Follows Dr. Alvy Bimler -Has mets to liver and sacrum which have worsened  -last Keytruda 2-3 weeks prior to admission -Holding off all chemo for now and her conditioning is such that she may not tolerate even salvage Chemo -Repeat CT Scan done at family request showed disease progression and prognostication -Palliative and Oncology working with patient and family and GOC is transitioning towards comfort care -Palliative was helping arrange Hospice as  Patient was going to transition home with Home Hospice; further discussion with Palliative Dr. Domingo Cocking revealed that the patient feels that she "wants to try to go with home with home hospice care but feels need to ensure that things are stable before doing so. ... Plan was to  transition home with beginning a week was very short of a stable regimen that can be duplicated at home." -Family and patient have changed their mind and did not want hospice currently and want to pursue every avenue possible including possible tertiary care center and second opinion.  Before this he would like to have Dr. Julieanne Manson weigh in. Patient was seen by Dr. Benay Spice early this morning per patient and patient states Dr. Benay Spice will be back this afternoon to assess her.  Paroxysmal Atrial Fibrillation -Continue flecainide 50 mg daily.  Patient currently in sinus rhythm.  Xarelto on hold secondary to severe thrombocytopenia.   Right pleural effusion Questionable etiology.  Likely secondary to metastatic disease.  Patient with a platelet count of 8.  Patient on nasal cannula and not in extremis.  Follow for now.  Hypokalemia/Hypomagnesemia -Hypokalemia improved.  Potassium at 4.0.  Patient still hypomagnesemic with a magnesium of 1.6.  We will give magnesium 4 g IV x1.  Repeat labs in the morning.   Hyponatremia -due to poororalintake and dehydration, improved -Discontinued IVF due to Volume Overload  -Continue IV Lasix.  Sodium level at 133.  Follow.    Anemia due to antineoplastic chemotherapy -This is likely anemia of chronic disease -Serum Y09 elevated, folic acid level low -Iron 25, sats 23, ferritin 4,414 -Hb/Hct now stable at  8.0 -Continue to Monitor for S/Sx of Bleeding  -Repeat CBC in AM   MDD (major depressive disorder), recurrent severe, without psychosis ([place]): -Currently stable.  Continue citalopram 20 mg daily.  Continue Zyprexa 2.5 mg p.o. nightly.  Follow.    Severe Protein-Calorie Malnutrition  -Appreciate Nutritionist Recc's -C/w Protein Supplement Liquid 11 ounces BID  -Nutritionist is encouraging the family to bring food the patient would like.  Leukocytosis -The setting of IV steroid demargination with Decadron use; WBC trended up to 22.0  and is now 20.4. -Now on 8 mg of po Daily Dexamethasone but had to change to IV due to Nausea and Vomiting -Continue monitor for signs and symptoms of infection -Patient was on IV Zosyn and subsequently changed to oral Augmentin and then back to IV Zosyn.  Patient completed a full course of antibiotic treatments and antibiotics have been discontinued.  Patient will be monitored off antibiotics.   AKI improving -Initially on presentation patient had worsening kidney function which has subsequently improved.  Creatinine down to 0.95.  -Initially worsened by IV Lasix and IV Zosyn probably patient had a lot of volume overload -Zosyn changed to Augmentin and Lasix changed to po but unable to tolerate so changed back to IV. - monitor renal function with IV Lasix.  Follow.  DVT prophylaxis: SCDs Code Status: DNR Family Communication: Updated patient and husband at bedside. Disposition Plan: To be determined.   Consultants:   Palliative care: Dr. Hilma Favors 12/26/2017  Oncology second opinion: Dr. Benay Spice 01/04/2018  Hematology/oncology: Dr. Audelia Hives 12/25/2017  Procedures:   CT abdomen and pelvis 12/23/2017, 12/28/2017  CT chest 12/23/2017  CT angiogram chest 12/28/2017  CT head 12/24/2017  Chest x-ray 12/25/2017, 01/03/2018  Abdominal ultrasound 12/28/2017    Antimicrobials:   IV fluconazole 01/01/2018  Augmentin 12/30/2017>>>> 01/01/2018  Oral Diflucan 12/30/2017>>>> 01/01/2018  IV vancomycin 12/26/2017>>>>>  12/27/2017  IV Zosyn 12/25/2017>>>>>> 01/02/2018     Subjective: Patient sitting up in chair.  Denies any chest pain.  Still with some shortness of breath however states some improvement when sitting up as opposed to laying down.  Mentation improving.  Objective: Vitals:   01/02/18 0456 01/02/18 2025 01/03/18 1044 01/03/18 1348  BP: 118/77 (!) 144/82 131/80 132/83  Pulse: 82 92 (!) 101 93  Resp: 19 20 19 18   Temp: 98.3 F (36.8 C) 98.5 F (36.9 C) (!) 97.5 F (36.4 C)  (!) 97.5 F (36.4 C)  TempSrc: Oral Oral Oral Oral  SpO2: 94% 99% 91% 96%  Weight:      Height:        Intake/Output Summary (Last 24 hours) at 01/04/2018 1315 Last data filed at 01/03/2018 1500 Gross per 24 hour  Intake 589.18 ml  Output -  Net 589.18 ml   Filed Weights   12/24/17 2104  Weight: 99.8 kg    Examination:  General exam: Appears calm and comfortable  Respiratory system: Decreased breath sounds on the right.  No wheezes, no crackles.  Respiratory effort normal. Cardiovascular system: S1 & S2 heard, RRR. No JVD, murmurs, rubs, gallops or clicks. No pedal edema. Gastrointestinal system: Abdomen is nondistended, soft and nontender. No organomegaly or masses felt. Normal bowel sounds heard. Central nervous system: Alert and oriented. No focal neurological deficits. Extremities: Symmetric 5 x 5 power. Skin: No rashes, lesions or ulcers Psychiatry: Judgement and insight appear normal. Mood & affect appropriate.     Data Reviewed: I have personally reviewed following labs and imaging studies  CBC: Recent Labs  Lab 12/31/17 1051 01/01/18 0420 01/02/18 0432 01/03/18 0511 01/04/18 0430  WBC 21.2* 21.7* 12.0* 21.2* 20.4*  NEUTROABS 18.7* 18.9* 10.2* 18.5* 17.6*  HGB 8.1* 8.5* 8.0* 8.2* 8.0*  HCT 27.5* 28.0* 26.1* 26.7* 26.4*  MCV 93.5 93.6 92.6 92.4 92.6  PLT 18* 11* 8* 10* 8*   Basic Metabolic Panel: Recent Labs  Lab 12/29/17 0344 12/31/17 1051 01/01/18 0420 01/02/18 0432 01/03/18 0511 01/04/18 0430  NA 136 134* 133* 136 135 133*  K 3.6 3.5 3.5 3.3* 3.3* 4.0  CL 103 102 99 98 96* 94*  CO2 21* 20* 23 26 27 27   GLUCOSE 132* 171* 156* 171* 125* 138*  BUN 14 22* 20 19 21* 24*  CREATININE 1.10* 1.39* 1.18* 1.07* 0.97 0.95  CALCIUM 9.0 8.7* 9.0 9.2 9.2 9.1  MG 1.9  --  1.2* 1.6* 1.5* 1.6*  PHOS 3.2  --  3.3 3.1 2.9 3.1   GFR: Estimated Creatinine Clearance: 82.2 mL/min (by C-G formula based on SCr of 0.95 mg/dL). Liver Function Tests: Recent Labs   Lab 12/31/17 1051 01/01/18 0420 01/02/18 0432 01/03/18 0511 01/04/18 0430  AST 40 36 37 45* 45*  ALT 23 21 21 24 24   ALKPHOS 188* 181* 152* 205* 197*  BILITOT 1.1 1.7* 1.3* 1.4* 1.5*  PROT 5.6* 5.7* 5.7* 5.7* 5.6*  ALBUMIN 2.7* 2.7* 2.6* 2.6* 2.7*   No results for input(s): LIPASE, AMYLASE in the last 168 hours. No results for input(s): AMMONIA in the last 168 hours. Coagulation Profile: No results for input(s): INR, PROTIME in the last 168 hours. Cardiac Enzymes: No results for input(s): CKTOTAL, CKMB, CKMBINDEX, TROPONINI in the last 168 hours. BNP (last 3 results) No results for input(s): PROBNP in the last 8760 hours. HbA1C: No results for input(s): HGBA1C in the last 72 hours. CBG: Recent Labs  Lab 12/31/17 1419 12/31/17 2052  01/01/18 2103 01/02/18 2027 01/03/18 1311  GLUCAP 148* 182* 310* 256* 173*   Lipid Profile: No results for input(s): CHOL, HDL, LDLCALC, TRIG, CHOLHDL, LDLDIRECT in the last 72 hours. Thyroid Function Tests: No results for input(s): TSH, T4TOTAL, FREET4, T3FREE, THYROIDAB in the last 72 hours. Anemia Panel: No results for input(s): VITAMINB12, FOLATE, FERRITIN, TIBC, IRON, RETICCTPCT in the last 72 hours. Sepsis Labs: No results for input(s): PROCALCITON, LATICACIDVEN in the last 168 hours.  Recent Results (from the past 240 hour(s))  C difficile quick scan w PCR reflex     Status: None   Collection Time: 12/25/17  3:47 PM  Result Value Ref Range Status   C Diff antigen NEGATIVE NEGATIVE Final   C Diff toxin NEGATIVE NEGATIVE Final   C Diff interpretation No C. difficile detected.  Final    Comment: Performed at [facility], [place] [address] [place], [place] [address]  Stool culture (children & immunocomp patients)     Status: None   Collection Time: 12/25/17  3:47 PM  Result Value Ref Range Status   Salmonella/Shigella Screen Final report  Final   Campylobacter Culture Final report  Final   E coli, Shiga toxin  Assay Negative Negative Final    Comment: (NOTE) Performed At: [facility] [place], [place] [id number] Rush Farmer MD GY:[id number]   STOOL CULTURE REFLEX - RSASHR     Status: None   Collection Time: 12/25/17  3:47 PM  Result Value Ref Range Status   Stool Culture result 1 (RSASHR) Comment  Final    Comment: (NOTE) No Salmonella or Shigella recovered. Performed At: [facility] [address] [place], [place] [id number] Rush Farmer MD EX:[id number]   STOOL CULTURE Reflex - CMPCXR     Status: None   Collection Time: 12/25/17  3:47 PM  Result Value Ref Range Status   Stool Culture result 1 (CMPCXR) Comment  Final    Comment: (NOTE) No Campylobacter species isolated. Performed At: [facility] [place], [place] [id number] Rush Farmer MD CH:[id number]          Radiology Studies: Dg Chest Port 1 View  Result Date: 01/03/2018 CLINICAL DATA:  Short of breath EXAM: PORTABLE CHEST 1 VIEW COMPARISON:  12/28/2017 FINDINGS: Right jugular Port-A-Cath is stable with its tip at the cavoatrial junction. Large right pleural effusion and basilar consolidation. Left lung is clear. No pneumothorax. IMPRESSION: Right pleural effusion and basilar consolidation are not significantly changed. Electronically Signed   By: Marybelle Killings M.D.   On: 01/03/2018 20:07        Scheduled Meds: . citalopram  10 mg Oral Daily  . dexamethasone  8 mg Intravenous Q24H  . fentaNYL  25 mcg Transdermal Q72H  . flecainide  50 mg Oral Daily  . furosemide  20 mg Intravenous Daily  . metoCLOPramide (REGLAN) injection  10 mg Intravenous TID AC & HS  . OLANZapine zydis  5 mg Oral QHS  . pantoprazole (PROTONIX) IV  40 mg Intravenous Q24H  . protein supplement shake  11 oz Oral BID BM   Continuous Infusions: . sodium chloride    . fluconazole (DIFLUCAN) IV 100 mg (01/03/18 1155)     LOS: 10 days    Time spent: 40 minutes    Irine Seal, MD [facility] Pager [phone]  If 7PM-7AM, please contact night-coverage [email or web address] Password [email or web address] 01/04/2018, 1:15 PM

## 2018-01-05 LAB — COMPREHENSIVE METABOLIC PANEL
ALT: 27 U/L (ref 0–44)
ANION GAP: 12 (ref 5–15)
AST: 54 U/L — ABNORMAL HIGH (ref 15–41)
Albumin: 2.6 g/dL — ABNORMAL LOW (ref 3.5–5.0)
Alkaline Phosphatase: 188 U/L — ABNORMAL HIGH (ref 38–126)
BILIRUBIN TOTAL: 1.4 mg/dL — AB (ref 0.3–1.2)
BUN: 23 mg/dL — AB (ref 6–20)
CO2: 28 mmol/L (ref 22–32)
Calcium: 9.1 mg/dL (ref 8.9–10.3)
Chloride: 92 mmol/L — ABNORMAL LOW (ref 98–111)
Creatinine, Ser: 0.91 mg/dL (ref 0.44–1.00)
GFR calc Af Amer: >60 mL/min (ref >60)
Glucose, Bld: 179 mg/dL — ABNORMAL HIGH (ref 70–99)
POTASSIUM: 3.8 mmol/L (ref 3.5–5.1)
Sodium: 132 mmol/L — ABNORMAL LOW (ref 135–145)
TOTAL PROTEIN: 5.4 g/dL — AB (ref 6.5–8.1)

## 2018-01-05 LAB — MAGNESIUM: Magnesium: 1.8 mg/dL (ref 1.7–2.4)

## 2018-01-05 LAB — CBC WITH DIFFERENTIAL/PLATELET
Abs Immature Granulocytes: 0.39 10*3/uL — ABNORMAL HIGH (ref 0.00–0.07)
Basophils Absolute: 0 10*3/uL (ref 0.0–0.1)
Basophils Relative: 0 %
EOS ABS: 0 10*3/uL (ref 0.0–0.5)
Eosinophils Relative: 0 %
HEMATOCRIT: 25.2 % — AB (ref 36.0–46.0)
Hemoglobin: 7.8 g/dL — ABNORMAL LOW (ref 12.0–15.0)
Immature Granulocytes: 2 %
LYMPHS ABS: 1.6 10*3/uL (ref 0.7–4.0)
Lymphocytes Relative: 8 %
MCH: 28.2 pg (ref 26.0–34.0)
MCHC: 31 g/dL (ref 30.0–36.0)
MCV: 91 fL (ref 80.0–100.0)
MONO ABS: 0.7 10*3/uL (ref 0.1–1.0)
Monocytes Relative: 4 %
Neutro Abs: 17.9 10*3/uL — ABNORMAL HIGH (ref 1.7–7.7)
Neutrophils Relative %: 86 %
Platelets: 8 10*3/uL — CL (ref 150–400)
RBC: 2.77 MIL/uL — ABNORMAL LOW (ref 3.87–5.11)
RDW: 18.8 % — AB (ref 11.5–15.5)
WBC: 20.6 10*3/uL — ABNORMAL HIGH (ref 4.0–10.5)
nRBC: 0.2 % (ref 0.0–0.2)

## 2018-01-05 LAB — GLUCOSE, CAPILLARY: GLUCOSE-CAPILLARY: 304 mg/dL — AB (ref 70–99)

## 2018-01-05 MED ORDER — LORAZEPAM 0.5 MG PO TABS: 0.5000 mg | ORAL_TABLET | Freq: Four times a day (QID) | ORAL | Status: DC | PRN

## 2018-01-05 MED ORDER — OXYCODONE HCL 5 MG/5ML PO SOLN: 10.0000 mg | ORAL | Status: DC | PRN

## 2018-01-05 MED ORDER — SIMETHICONE 80 MG PO CHEW
160.0000 mg | CHEWABLE_TABLET | Freq: Four times a day (QID) | ORAL | Status: DC | PRN
  Administered 2018-01-05: 160 mg via ORAL
  Filled 2018-01-05: qty 2

## 2018-01-05 MED ORDER — MAGNESIUM SULFATE 4 GM/100ML IV SOLN
4.0000 g | Freq: Once | INTRAVENOUS | Status: AC
  Administered 2018-01-05: 4 g via INTRAVENOUS
  Filled 2018-01-05: qty 100

## 2018-01-05 NOTE — Progress Notes (Signed)
IP PROGRESS NOTE  Subjective:   Cindy Faulkner appears unchanged.  Her family is at the bedside.  She remains weak.  She is not ambulating.  Objective: Vital signs in last 24 hours: Blood pressure 133/81, pulse 87, temperature 98.2 F (36.8 C), resp. rate 16, height 5\' 7"  (1.702 m), weight 220 lb (99.8 kg), last menstrual period 02/12/2012, SpO2 94 %.  Intake/Output from previous day: 11/13 0701 - 11/14 0700 In: 108.9 [IV Piggyback:108.9] Out: -   Physical Exam: Not performed today  Lab Results: Recent Labs    01/04/18 0430 01/05/18 0531  WBC 20.4* 20.6*  HGB 8.0* 7.8*  HCT 26.4* 25.2*  PLT 8* 8*    BMET Recent Labs    01/04/18 0430 01/05/18 0531  NA 133* 132*  K 4.0 3.8  CL 94* 92*  CO2 27 28  GLUCOSE 138* 179*  BUN 24* 23*  CREATININE 0.95 0.91  CALCIUM 9.1 9.1    Peripheral blood smear 01/03/2018: The polychromasia is increased.  Few teardrops, helmets, and schistocytes.  The white cells are increased in number.  The majority of the white cells are mature neutrophils.  There are bands and a rare myelocyte.  The platelets are markedly decreased in number.  No platelet clumps.  Some of the platelets are large.  Studies/Results: Dg Chest Port 1 View  Result Date: 01/03/2018 CLINICAL DATA:  Short of breath EXAM: PORTABLE CHEST 1 VIEW COMPARISON:  12/28/2017 FINDINGS: Right jugular Port-A-Cath is stable with its tip at the cavoatrial junction. Large right pleural effusion and basilar consolidation. Left lung is clear. No pneumothorax. IMPRESSION: Right pleural effusion and basilar consolidation are not significantly changed. Electronically Signed   By: Marybelle Killings M.D.   On: 01/03/2018 20:07    Medications: I have reviewed the patient's current medications.  Assessment/Plan:  1.  Metastatic squamous cell carcinoma of unknown primary- disease progression documented during this admission following recent pembrolizumab therapy 2.  Pain secondary to #1 3.   Deconditioning/failure to thrive 4.  Anemia/thrombocytopenia 5.  Leukocytosis 6.  Coagulopathy 7.  Malnutrition 8.  History of atrial fibrillation 9.  History of a CVA   Cindy Faulkner has metastatic squamous cell carcinoma of unknown primary.  I had further discussions with Cindy Faulkner and her family.  Her performance status is not adequate to consider chemotherapy at present.  I recommend home Hospice care.  I will schedule outpatient follow-up at the Cancer center in 1-2 weeks to reassess her status. She has difficulty taking pills.  I will consolidate her medical regimen and try a sublingual narcotic for pain.  Recommendations: 1.  Home hospice care 2.  Transition to oral narcotic for breakthrough pain, I will add oxycodone liquid 3.  Sublingual Ativan for nausea or anxiety 4.  Discontinue daily labs   LOS: 11 days   Betsy Coder, MD   01/05/2018, 3:03 PM

## 2018-01-05 NOTE — Progress Notes (Signed)
PT Cancellation Note  Patient Details Name: Cindy Faulkner MRN: [id number] DOB: [birth year]   Cancelled Treatment:    Reason Eval/Treat Not Completed: Medical issues which prohibited therapy, note patient to rgo home with Hospice. PLTs low and PT clinical resource recommends limited activity. Will check back tomorrow for  Any needs. Claretha Cooper 01/05/2018, 3:31 PM  Tresa Endo PT Acute Rehabilitation Services Pager [phone] Office [phone]

## 2018-01-05 NOTE — Progress Notes (Signed)
PROGRESS NOTE    Cindy Faulkner  DQQ:[id number] DOB: September 08, 1965 DOA: 12/24/2017 PCP: Lennie Odor, PA-C    Brief Narrative:  The patient is a 52 y.o.femalewith medical history significant ofmetastatic squamous cell carcinoma involving liver & sacrum with unknown primary site(stoped chemo in May),hypertension, hyperlipidemia, stroke, GERD, depression, atrial fibrillation on Xarelto, who presents with altered mental status. According to the patient's husband, the patient has had a functional decline over the past 2 weeks, now having difficulty getting out of bed without assistance. In the past 2 to 3 days prior to admission, the patient has had worsening oral intake and increasing lethargy. The patient has had on and off low-grade temperatures for the past week with a T-max of 101.0 F approximately 3 days prior to this admission. There has been no headaches, neck pain, chest pain, coughing, hemoptysis, diarrhea, dysuria, hematuria, hematochezia, melena. Upon presentation, the patient was noted to have sodium 128 and WBC 24.1 and hemoglobin 8.0. There is concern for infectious source. Patient was started on empiric vancomycin and Zosyn. Pt admitted for further management.  Encephalopathy improved some but patient feels more SOB and distended. IVF discontinued and Paracentesis attempted but did not have enough fluid to tap. Palliative Care and Hematology/Oncology to re-evaluate and make further recommendations. After exentensive discussion with Hematology/Oncology and Palliative, Goals of Care are going to be shifted to Comfort Care. Palliative helping in the process of arranging Hospice and patient has elected Home Hospice. Dr. Hilma Favors of palliative discussed with the patient and she will be treated for few more days with an oral regimen of antibiotics and switch to Augmentin.  She also has oral thrush and she will be treated with fluconazole as well as Magic mouthwash.  Patient acutely  worsened and reported nausea which worsened after attempting to take pills and patient vomited yesterday.  Most of her p.o. medications will be changed back to IV form at this time as patient wants to stay in the hospital and see how things go and how she feels after being back on IV pain regimen.  Palliative continuing goals of care discussion with the patient and family.  Patient wants to continue the current regimen at this time and husband hopeful that she will get better and get back to work.  Patient and family want an opinion by Dr. Benay Spice and are hopeful that she will get better.  Patient wants to continue IV medications but did not want p.o. medications because she thinks she will throw up.  I discussed with the patient that we cannot discharge her home on IV medications that she will need to attempt and she will think about doing this the next few days.    Assessment & Plan:   Principal Problem:   Acute metabolic encephalopathy Active Problems:   Essential hypertension   AF (paroxysmal atrial fibrillation) (HCC)   Cerebral thrombosis with cerebral infarction   Stroke (cerebrum) (HCC)   Metastasis to liver (HCC)   Metastatic squamous cell carcinoma involving liver & sacrum with unknown primary site    Hypokalemia   Anemia due to antineoplastic chemotherapy   MDD (major depressive disorder), recurrent severe, without psychosis ([place])   Severe protein-calorie malnutrition (HCC)   Sepsis (HCC)   Hyponatremia   Thrombocytopenia (HCC)   Acute metabolic encephalopathy likely 2/2 worsening metastatic CA with possible sepsis of unknown origin; improved  -Multifactorial including infectious etiology, hyponatremia, dehydration, pancytopenia, cancer of unknown origin ?Cervical Ca as primary -Currently afebrile, withimprovedleukocytosis -Urinalysis negative for pyuria,  UC with no growth -Stool culturefor C.diff negative -BC X 2, NGTD at 5 days  -Chest x-ray reviewed--no  consolidations -Personally reviewed EKG--sinus rhythm, no ST-T wave changes -CT head negative for any mets -IV Zosyn now discontinued by palliative -Appreciate further evaluation and recommendations by Palliative; they are taking it day by day and continue to monitor patient's progress and readiness to transition home with home hospice.  At this time she wants to continue IV regimen and see how she feels rather than transitioning home to home hospice currently -Family requested Repeat CT Scan for confirmation of disease and prognostication and was done which showed progression of metastatic disease.  -Oncology and palliative care following.  Coagulopathy/Thrombocytopenia -No signs of active bleeding -Likely due to sepsis resulting in ??DIC from Cancer -Presenting INR 3.66, PTT 51, fibrinogen 226, DAT negative -D-dimer 14.95 -S/P transfused 2 units PRBC, s/p 1U of platelet 12/27/17 -Hematology/oncology on board and following.  -Platelet Count now 8 and  but Hb/Hct now is 7.8 -Repeat CBC in AM   Intractable nausea/vomiting/abdominal distension/fullness -12/23/2017 CT abdomen and pelvis--interval progression of malignancy with progression of liver metastasis; new abdominal ascites with loculated fluid over the liver and peritoneal nodule in the right upper quadrant; new right upper lobe and right lower lobe lung nodule, multiple splenic and renal infarcts -Scheduled reglan, zyprexa for nausea, decadron as per palliative and Decadron dose has been reduced and is now on 8 mg po Daily but had to be changed back to IV given her nausea and vomiting -Palliative recommending therapeutic/comfort paracentesis. IR consulted, unable to do 12/27/17 due to coagulopathy and unable to do today as not enough fluid -Tried IV Lasix 40 mg daily for the the last 2 days and this is helped with her abdominal distention.  Will transition to p.o. Lasix as creatinine worsening and place the patient on 40 mg of Lasix daily  however patient unable to tolerate p.o. at this time so changes IV 20 mg daily and will continue for now but positive discussed with the patient about stopping and patient has elected to continue it as it is helped her respiratory status and kidney function -Pain management with fentanyl changed to Dilaudid and added po Oxycodone but changed again by Palliative in hopes of transitioning home; She is now on oxycodone 10 mg sublingually every 1 hour as needed for breakthrough pain changed to p.o. oxycodone and with IV fentanyl 12.5 to 25 mcg IV q. one hour PRN for moderate pain -Palliative is also added a fentanyl patch 25 mcg transdermally every 72 hours -Palliative following and appreciate GOC discussion and likely transition to home with hospice. -Patient was unable to tolerate p.o. 01/01/2018 and all her medications were changed back to IV and palliative care has changed metoclopramide 10 mg IV 3 times daily before meals and bedtime.   -She is feeling better yesterday but was nauseous today.  Does not want to attempt to take p.o. medications again at this time.  Cancer of unknown origin likely ?cervical CA -Follows Dr. Alvy Bimler -Has mets to liver and sacrum which have worsened  -last Keytruda 2-3 weeks prior to admission -Holding off all chemo for now and her conditioning is such that she may not tolerate even salvage Chemo -Repeat CT Scan done at family request showed disease progression and prognostication -Palliative and Oncology working with patient and family and GOC is transitioning towards comfort care -Palliative was helping arrange Hospice as Patient was going to transition home with Home Hospice; further discussion  with Palliative Dr. Domingo Cocking revealed that the patient feels that she "wants to try to go with home with home hospice care but feels need to ensure that things are stable before doing so. ... Plan was to transition home with beginning a week was very short of a stable regimen that  can be duplicated at home." -Family and patient have changed their mind and did not want hospice currently and want to pursue every avenue possible including possible tertiary care center and second opinion.  Before this he would like to have Dr. Julieanne Manson weigh in. Patient was seen by Dr. Benay Spice who feels patient's performance status not adequate at this time to consider chemotherapy at present and recommending hospice care.  Oncology recommending outpatient follow-up in the cancer center in 1 to 2 weeks to reassess patient status.   Paroxysmal Atrial Fibrillation -Continue flecainide 50 mg daily.  Patient currently in sinus rhythm.  Xarelto on hold secondary to severe thrombocytopenia.  Will likely not resume Xarelto due to thrombocytopenia.  Right pleural effusion Questionable etiology.  Likely secondary to metastatic disease.  Patient with a platelet count of 8.  Patient on nasal cannula and not in extremis.  Follow for now.  Hypokalemia/Hypomagnesemia -Hypokalemia improved.  Potassium at 3.8.  Patient still hypomagnesemic with a magnesium of 1.8 from 1.6.  Give a dose of IV magnesium to keep magnesium greater than 2.  Follow.  Hyponatremia -due to poororalintake and dehydration, improved -Discontinued IVF due to Volume Overload  -Continue IV Lasix.  Sodium level at 132.  Follow.    Anemia due to antineoplastic chemotherapy -This is likely anemia of chronic disease -Serum O87 elevated, folic acid level low -Iron 25, sats 23, ferritin 4,414 -Hb/Hct now stable at  8.0 -Continue to Monitor for S/Sx of Bleeding  -Repeat CBC in AM   MDD (major depressive disorder), recurrent severe, without psychosis ([place]): -Stable.  Continue current regimen of citalopram 25 mg daily and Zyprexa 2.5 mg nightly.      Severe Protein-Calorie Malnutrition  -Appreciate Nutritionist Recc's -C/w Protein Supplement Liquid 11 ounces BID  -Nutritionist is encouraging the family to bring food the  patient would like.  Leukocytosis -The setting of IV steroid demargination with Decadron use secondary to malignancy; WBC trended up to 22.0 and is now 20.6. -Now on 8 mg of po Daily Dexamethasone but had to change to IV due to Nausea and Vomiting -Continue monitor for signs and symptoms of infection -Patient was on IV Zosyn and subsequently changed to oral Augmentin and then back to IV Zosyn.  Patient completed a full course of antibiotic treatments and antibiotics have been discontinued.  Patient will be monitored off antibiotics.  Patient currently afebrile.  Follow.  AKI improving -Initially on presentation patient had worsening kidney function which has subsequently improved.  Creatinine down to 0.95.  -Initially worsened by IV Lasix and IV Zosyn probably patient had a lot of volume overload -Zosyn changed to Augmentin and Lasix changed to po but unable to tolerate so changed back to IV. - monitor renal function with IV Lasix.  Renal function stable.  Follow.  DVT prophylaxis: SCDs Code Status: DNR Family Communication: Updated patient and daughter at bedside. Disposition Plan: To be determined.  Likely home with home health versus home with hospice.   Consultants:   Palliative care: Dr. Hilma Favors 12/26/2017  Oncology second opinion: Dr. Benay Spice 01/04/2018  Hematology/oncology: Dr. Audelia Hives 12/25/2017  Procedures:   CT abdomen and pelvis 12/23/2017, 12/28/2017  CT chest 12/23/2017  CT angiogram chest 12/28/2017  CT head 12/24/2017  Chest x-ray 12/25/2017, 01/03/2018  Abdominal ultrasound 12/28/2017    Antimicrobials:   IV fluconazole 01/01/2018  Augmentin 12/30/2017>>>> 01/01/2018  Oral Diflucan 12/30/2017>>>> 01/01/2018  IV vancomycin 12/26/2017>>>>> 12/27/2017  IV Zosyn 12/25/2017>>>>>> 01/02/2018     Subjective: Patient sitting up in chair.  Denies any chest pain.  Still with some complaints of shortness of breath.  Mentation improving.  Afebrile.    Objective: Vitals:   01/02/18 2025 01/03/18 1044 01/03/18 1348 01/05/18 0547  BP: (!) 144/82 131/80 132/83 133/81  Pulse: 92 (!) 101 93 87  Resp: 20 19 18 16   Temp: 98.5 F (36.9 C) (!) 97.5 F (36.4 C) (!) 97.5 F (36.4 C) 98.2 F (36.8 C)  TempSrc: Oral Oral Oral   SpO2: 99% 91% 96% 94%  Weight:      Height:        Intake/Output Summary (Last 24 hours) at 01/05/2018 1841 Last data filed at 01/05/2018 1500 Gross per 24 hour  Intake 381.18 ml  Output -  Net 381.18 ml   Filed Weights   12/24/17 2104  Weight: 99.8 kg    Examination:  General exam: Appears calm and comfortable  Respiratory system: Decreased breath sounds on the right.  No wheezes, no crackles.  Respiratory effort normal. Cardiovascular system: S1 & S2 heard, RRR. No JVD, murmurs, rubs, gallops or clicks. No pedal edema. Gastrointestinal system: Abdomen is nondistended, soft and nontender. No organomegaly or masses felt. Normal bowel sounds heard. Central nervous system: Alert and oriented. No focal neurological deficits. Extremities: Symmetric 5 x 5 power. Skin: No rashes, lesions or ulcers Psychiatry: Judgement and insight appear normal. Mood & affect appropriate.     Data Reviewed: I have personally reviewed following labs and imaging studies  CBC: Recent Labs  Lab 01/01/18 0420 01/02/18 0432 01/03/18 0511 01/04/18 0430 01/05/18 0531  WBC 21.7* 12.0* 21.2* 20.4* 20.6*  NEUTROABS 18.9* 10.2* 18.5* 17.6* 17.9*  HGB 8.5* 8.0* 8.2* 8.0* 7.8*  HCT 28.0* 26.1* 26.7* 26.4* 25.2*  MCV 93.6 92.6 92.4 92.6 91.0  PLT 11* 8* 10* 8* 8*   Basic Metabolic Panel: Recent Labs  Lab 01/01/18 0420 01/02/18 0432 01/03/18 0511 01/04/18 0430 01/05/18 0531  NA 133* 136 135 133* 132*  K 3.5 3.3* 3.3* 4.0 3.8  CL 99 98 96* 94* 92*  CO2 23 26 27 27 28   GLUCOSE 156* 171* 125* 138* 179*  BUN 20 19 21* 24* 23*  CREATININE 1.18* 1.07* 0.97 0.95 0.91  CALCIUM 9.0 9.2 9.2 9.1 9.1  MG 1.2* 1.6* 1.5* 1.6*  1.8  PHOS 3.3 3.1 2.9 3.1  --    GFR: Estimated Creatinine Clearance: 85.8 mL/min (by C-G formula based on SCr of 0.91 mg/dL). Liver Function Tests: Recent Labs  Lab 01/01/18 0420 01/02/18 0432 01/03/18 0511 01/04/18 0430 01/05/18 0531  AST 36 37 45* 45* 54*  ALT 21 21 24 24 27   ALKPHOS 181* 152* 205* 197* 188*  BILITOT 1.7* 1.3* 1.4* 1.5* 1.4*  PROT 5.7* 5.7* 5.7* 5.6* 5.4*  ALBUMIN 2.7* 2.6* 2.6* 2.7* 2.6*   No results for input(s): LIPASE, AMYLASE in the last 168 hours. No results for input(s): AMMONIA in the last 168 hours. Coagulation Profile: No results for input(s): INR, PROTIME in the last 168 hours. Cardiac Enzymes: No results for input(s): CKTOTAL, CKMB, CKMBINDEX, TROPONINI in the last 168 hours. BNP (last 3 results) No results for input(s): PROBNP in the last 8760 hours. HbA1C: No  results for input(s): HGBA1C in the last 72 hours. CBG: Recent Labs  Lab 12/31/17 2052 01/01/18 2103 01/02/18 2027 01/03/18 1311 01/04/18 2214  GLUCAP 182* 310* 256* 173* 256*   Lipid Profile: No results for input(s): CHOL, HDL, LDLCALC, TRIG, CHOLHDL, LDLDIRECT in the last 72 hours. Thyroid Function Tests: No results for input(s): TSH, T4TOTAL, FREET4, T3FREE, THYROIDAB in the last 72 hours. Anemia Panel: No results for input(s): VITAMINB12, FOLATE, FERRITIN, TIBC, IRON, RETICCTPCT in the last 72 hours. Sepsis Labs: No results for input(s): PROCALCITON, LATICACIDVEN in the last 168 hours.  No results found for this or any previous visit (from the past 240 hour(s)).       Radiology Studies: Dg Chest Port 1 View  Result Date: 01/03/2018 CLINICAL DATA:  Short of breath EXAM: PORTABLE CHEST 1 VIEW COMPARISON:  12/28/2017 FINDINGS: Right jugular Port-A-Cath is stable with its tip at the cavoatrial junction. Large right pleural effusion and basilar consolidation. Left lung is clear. No pneumothorax. IMPRESSION: Right pleural effusion and basilar consolidation are not  significantly changed. Electronically Signed   By: Marybelle Killings M.D.   On: 01/03/2018 20:07        Scheduled Meds: . citalopram  10 mg Oral Daily  . dexamethasone  8 mg Intravenous Q24H  . fentaNYL  25 mcg Transdermal Q72H  . flecainide  50 mg Oral Daily  . furosemide  20 mg Intravenous Daily  . OLANZapine zydis  5 mg Oral QHS  . pantoprazole (PROTONIX) IV  40 mg Intravenous Q24H  . protein supplement shake  11 oz Oral BID BM   Continuous Infusions: . sodium chloride    . fluconazole (DIFLUCAN) IV Stopped (01/04/18 1611)     LOS: 11 days    Time spent: 40 minutes    Irine Seal, MD [facility] Pager [phone]  If 7PM-7AM, please contact night-coverage [email or web address] Password [facility] 01/05/2018, 6:41 PM

## 2018-01-05 NOTE — Care Management Note (Signed)
Case Management Note  Patient Details  Name: Cindy Faulkner MRN: [id number] Date of Birth: Dec 02, 1965  Subjective/Objective:                  Discharge Planning  Action/Plan: Patient states she would like to go home possibly Friday Nov. 15th. DME needs-hospital bed, 3 in 1, shower stool,  HHC- RN, PT,OT, Aide Alert placed on md sticky note. Expected Discharge Date:                  Expected Discharge Plan:     In-House Referral:     Discharge planning Services     Post Acute Care Choice:    Choice offered to:     DME Arranged:    DME Agency:     HH Arranged:    HH Agency:     Status of Service:     If discussed at [employer], dates discussed:    Additional Comments:  Leeroy Cha, RN 01/05/2018, 10:17 AM

## 2018-01-05 NOTE — Progress Notes (Signed)
OT Cancellation Note  Patient Details Name: Cindy Faulkner MRN: [id number] DOB: 08-19-1965   Cancelled Treatment:    Reason Eval/Treat Not Completed: Other (comment).  I checked in with [name].  She doesn't feel she needs OT/PT in the hospital. Family has been helping her to the chair and BSC.  CM is ordering DME for home.  Will sign off  [name] 01/05/2018, 3:58 PM  [name], OTR/L Acute Rehabilitation Services [phone] [facility] pager [phone] office 01/05/2018

## 2018-01-05 NOTE — Progress Notes (Signed)
   01/05/18 1000  Clinical Encounter Type  Visited With Family  Visit Type Follow-up;Spiritual support;Psychological support  Referral From Family  Consult/Referral To Chaplain  Spiritual Encounters  Spiritual Needs Emotional;Other (Comment) (Spiritual Care Conversation/Support)  Stress Factors  Family Stress Factors Health changes;Major life changes    I visited with the patient's husband as a follow-up from previous visits. He remains optimistic about Noha's current health state. He stated that "she has to gain weight in order to receive a round of chemotherapy." At this point, he and [name] are weighing their options. He stated that right now the goal is to get her home. His Spirituality is very important to him and gives him hope. He appreciates the regular visits from [place].   I will follow up with Shanta and her family.   Chaplain Shanon Ace M.Div., [facility]

## 2018-01-06 ENCOUNTER — Ambulatory Visit (HOSPITAL_COMMUNITY): Payer: 59

## 2018-01-06 ENCOUNTER — Telehealth: Payer: Self-pay | Admitting: *Deleted

## 2018-01-06 ENCOUNTER — Other Ambulatory Visit: Payer: Self-pay | Admitting: Physician Assistant

## 2018-01-06 DIAGNOSIS — R180 Malignant ascites: Secondary | ICD-10-CM

## 2018-01-06 DIAGNOSIS — D61818 Other pancytopenia: Secondary | ICD-10-CM

## 2018-01-06 LAB — MAGNESIUM: Magnesium: 1.8 mg/dL (ref 1.7–2.4)

## 2018-01-06 LAB — GLUCOSE, CAPILLARY
GLUCOSE-CAPILLARY: 238 mg/dL — AB (ref 70–99)
Glucose-Capillary: 138 mg/dL — ABNORMAL HIGH (ref 70–99)

## 2018-01-06 MED ORDER — OXYCODONE HCL 5 MG PO TABS: 5.0000 mg | ORAL_TABLET | ORAL | 0 refills | Status: AC | PRN

## 2018-01-06 MED ORDER — OLANZAPINE 5 MG PO TBDP: 5.0000 mg | ORAL_TABLET | Freq: Every day | ORAL | 0 refills | Status: DC

## 2018-01-06 MED ORDER — OXYCODONE HCL 20 MG/ML PO CONC: 6.0000 mg | ORAL | 0 refills | Status: DC | PRN

## 2018-01-06 MED ORDER — ALUM & MAG HYDROXIDE-SIMETH 400-400-40 MG/5ML PO SUSP: 5.0000 mL | Freq: Three times a day (TID) | ORAL | 0 refills | Status: AC

## 2018-01-06 MED ORDER — METOCLOPRAMIDE HCL 5 MG/5ML PO SOLN: 5.0000 mg | Freq: Three times a day (TID) | ORAL | 0 refills | Status: AC

## 2018-01-06 MED ORDER — DEXAMETHASONE 1 MG/ML PO CONC: 8.0000 mg | Freq: Every day | ORAL | 0 refills | Status: AC

## 2018-01-06 MED ORDER — FENTANYL 25 MCG/HR TD PT72: 25.0000 ug | MEDICATED_PATCH | TRANSDERMAL | 0 refills | Status: AC

## 2018-01-06 MED ORDER — FUROSEMIDE 40 MG PO TABS: ORAL_TABLET | ORAL | 0 refills | Status: AC

## 2018-01-06 MED ORDER — FAMOTIDINE 40 MG/5ML PO SUSR: 40.0000 mg | Freq: Two times a day (BID) | ORAL | 0 refills | Status: AC

## 2018-01-06 MED ORDER — LORAZEPAM 2 MG/ML PO CONC: 1.0000 mg | Freq: Four times a day (QID) | ORAL | 0 refills | Status: AC | PRN

## 2018-01-06 MED FILL — FUROSEMIDE 40 MG TAB: 40 | 15 days supply | Qty: 15 | Fill #0

## 2018-01-06 MED FILL — FAMOTIDINE 40 MG/5 ML SUSP: 40 | 5 days supply | Qty: 50 | Fill #0

## 2018-01-06 MED FILL — LORazepam 2 MG/ML CONC: 2 | 15 days supply | Qty: 30 | Fill #0

## 2018-01-06 MED FILL — oxyCODONE HCL 5 MG TABS: 5 | 5 days supply | Qty: 30 | Fill #0

## 2018-01-06 MED FILL — METOCLOPRAMIDE 5 MG/5 ML SY: 5 | 12 days supply | Qty: 240 | Fill #0

## 2018-01-06 MED FILL — FENTANYL 25 MCG/HR PT72: 25 | 30 days supply | Qty: 10 | Fill #0

## 2018-01-06 NOTE — Progress Notes (Signed)
[facility] Liaison: RN visit   Notified by Velva Harman,  [facility] of patient/family request for [facility] services at home after discharge. Chart and patient information under review by [facility] physician. Hospice eligibility pending at this time.   Writer spoke with patient and husband at bedside to initiate education related to hospice philosophy, services and team approach to care. [place] and [place] verbalized understanding of information given. Per discussion, plan is for discharge to home by private vehicle today.   Please send signed and completed DNR form home with patient/family. Patient will need prescriptions for discharge comfort medications.   DME needs have been discussed, patient currently has the following equipment in the home: walker.  Patient/family requests the following DME for delivery to the home: Oxygen, hospital bed, 3N1 and shower chair . [facility] equipment manager has been notified and will contact [place] to arrange delivery to the home. Home address has been verified and is correct in the chart.         Kasandra Knudsen is the family member to contact to arrange time of delivery.   [facility] aware of the above. Please notify [facility] when patient is ready to leave the unit at discharge. (Call [phone] or [phone] after 5pm.) [facility] information and contact numbers given to Danny at time of visit. Above information shared with Velva Harman, [facility].   Please call with any hospice related questions.   Thank you for this referral.   Farrel Gordon, RN, [facility] Liaison [phone] ? Hospital liaisons are now on [place].

## 2018-01-06 NOTE — Telephone Encounter (Signed)
Provided patient appointment on 11/22 with Dr. Benay Spice at 2:30 pm. Per Dr. Stevphen Meuse to cancel the appointment with [facility]. They agree to call GI and cancel.

## 2018-01-06 NOTE — Progress Notes (Signed)
PT Cancellation Note  Patient Details Name: Cindy Faulkner MRN: [id number] DOB: 1965-07-26   Cancelled Treatment:    Reason Eval/Treat Not Completed: PT screened, no needs identified, will sign off. Per OT communication with patient.   Claretha Cooper 01/06/2018, 6:55 AM [place] Pager [phone] Office [phone]

## 2018-01-06 NOTE — Discharge Summary (Signed)
Physician Discharge Summary  FABIENNE NOLASCO RJJ:[id number] DOB: 1965-04-16 DOA: 12/24/2017  PCP: Lennie Odor, PA-C  Admit date: 12/24/2017 Discharge date: 01/06/2018  Time spent: 60 minutes  Recommendations for Outpatient Follow-up:  1. Patient to be discharged home with hospice. 2. Follow-up with Dr. Benay Spice in 1 week.   Discharge Diagnoses:  Principal Problem:   Acute metabolic encephalopathy Active Problems:   Essential hypertension   AF (paroxysmal atrial fibrillation) (HCC)   Cerebral thrombosis with cerebral infarction   Stroke (cerebrum) (HCC)   Metastasis to liver (HCC)   Metastatic squamous cell carcinoma involving liver & sacrum with unknown primary site    Hypokalemia   Anemia due to antineoplastic chemotherapy   MDD (major depressive disorder), recurrent severe, without psychosis ([place])   Severe protein-calorie malnutrition ([place])   Sepsis ([place])   Hyponatremia   Thrombocytopenia ([place])   Discharge Condition: Stable  Diet recommendation: Regular  Filed Weights   12/24/17 2104  Weight: 99.8 kg    History of present illness:  Per. Dr Melton Alar is a 52 y.o. female with medical history significant of metastatic squamous cell carcinoma involving liver & sacrum with unknown primary site (stoped chemo in May), hypertension, hyperlipidemia, stroke, GERD, depression, atrial fibrillation on Xarelto, who presents with altered mental status.  Per patient's husband, in the past several days, patient had been very lethargic, mildly confused.  She did not have unilateral weakness in extremities with no facial droop or slurred speech.  Patient has intermittent fever with temperature around 100.  Patient did not have chest pain or shortness of breath, but has a mild cough with a white-colored mucus production.  Patient has mild chronic diarrhea, which has not changed.  He had a one loose stool bowel movement today.  Patient nausea and vomited several times recently, but  currently no nausea vomiting.  Denied abdominal pain pain no symptoms of UTI.  When patient was seen patient in ED, patient was mildly confused, oriented to place and person, but not to time.  She moved all extremities normally.  No neck rigidity.  ED Course: pt was found to have WBC 24.1, hemoglobin dropped from 9.2 on 12/16/2017 to 8.0, thrombocytopenia with platelet 27 which was 103 on 12/16/2017, INR 3.66, PTT 55, negative pregnancy test, lipase 18, sodium 128, creatinine and BUN normal, abnormal liver function with ALP 168, AST 47, ALT 24, total bilirubin 1.4), d-dimer 14.95, rectal temperature 100, tachycardia, tachypnea, oxygen saturation 93% on room air.  Chest x-ray is negative for infiltration, but with right hemi-diaphragmatic elevation, CT head is negative for acute intracranial abnormalities.  Patient is admitted to telemetry bed as inpatient.  Hospital Course:  Acute metabolic encephalopathy likely 2/2 worsening metastatic CA with possible sepsis of unknown origin; improved  -Multifactorial including infectious etiology, hyponatremia, dehydration, pancytopenia, cancer of unknown origin ?Cervical Ca as primary -Currently afebrile, withimprovedleukocytosis -Urinalysis negative for pyuria, UC with no growth -Stool culturefor C.diff negative -BC X 2, NGTD at 5 days  -Chest x-ray reviewed--no consolidations -Personally reviewed EKG--sinus rhythm, no ST-T wave changes -CT head negative for any mets -IV Zosyn now discontinued by palliative -Appreciate further evaluation and recommendations by Palliative; they are taking it day by day and continue to monitor patient's progress and readiness to transition home with home hospice. At this time she wants to continue IV regimen and see how she feels rather than transitioning home to home hospice currently -Family requested Repeat CT Scan for confirmation of disease and prognostication and was  done which showed progression of metastatic disease.   -Oncology and palliative care following. -Patient to be discharged home with hospice.  Coagulopathy/Thrombocytopenia -No signs of active bleeding -Likely due to sepsis resulting in ??DIC from Cancer -Presenting INR 3.66, PTT 51, fibrinogen 226, DAT negative -D-dimer 14.95 -S/P transfused 2 units PRBC, s/p 1U of platelet 12/27/17 -Hematology/oncology on board and following.  -Platelet Count now8and but Hb/Hct now is 7.8 -Patient to be discharged home with hospice.  Intractable nausea/vomiting/abdominal distension/fullness -12/23/2017 CT abdomen and pelvis--interval progression of malignancy with progression of liver metastasis; new abdominal ascites with loculated fluid over the liver and peritoneal nodule in the right upper quadrant; new right upper lobe and right lower lobe lung nodule, multiple splenic and renal infarcts -Scheduled reglan, zyprexa for nausea, decadron as per palliative and Decadron dose has been reduced and is now on 8 mg po Daily but had to be changed back to IV given her nausea and vomiting -Palliative recommended therapeutic/comfort paracentesis. IR consulted, unable to do 12/27/17 due to coagulopathy and unable to do today as not enough fluid -Tried IV Lasix 40 mg daily for the the last 2 days and this is helped with her abdominal distention. Will transition to p.o. Lasix as creatinine worsening and place the patient on 40 mg of Lasix daily however patient unable to tolerate p.o. at this time so changes IV 20 mg dailyand will continue for now but positive discussed with the patient about stopping and patient has elected to continue it as it is helped her respiratory status and kidney function -Pain management with fentanyl changed to Dilaudid and added po Oxycodone but changed again by Palliative in hopes of transitioning home; She is now on oxycodone 10 mg sublingually every 1 hour as needed for breakthrough pain changed to p.o. oxycodone and with IV fentanyl 12.5 to  25 mcg IV q. one hour PRN for moderate pain -Palliative is also added a fentanyl patch 25 mcg transdermally every 72 hours -Palliative following and appreciate GOC discussion and likely transition to home with hospice. -Patient was unable to tolerate p.o.01/01/2018 andall her medications were changed back to IV and palliative care has changed metoclopramide 10 mg IV 3 times daily before meals and bedtime.  -Patient improved in terms of her oral intake.  Patient will be discharged home with hospice.   Cancer of unknown origin likely ?cervical CA -Follows Dr. Alvy Bimler -Has mets to liver and sacrum which had worsened.  -last Keytruda 2-3 weeks prior to admission -Holding off all chemo for now and her conditioning is such that she may not tolerate even salvage Chemo -Repeat CT Scan done at family request showed disease progression and prognostication -Palliative and Oncology working with patient and family and GOC is transitioning towards comfort care -Palliativewashelping arrange Hospice as Patientwasgoing to transition home with Home Hospice; further discussion with Palliative Dr. Domingo Cocking revealed that the patient feels that she "wants to try to go with home with home hospice care but feels need to ensure that things are stable before doing so. ... Plan was to transition home with beginning a week was very short of a stable regimen that can be duplicated at home." -Family and patient have changed their mind and did not want hospice and wanted to pursue every avenue possible including possible tertiary care center and second opinion. Before this he would like to have Dr. Louie Casa in. Patient was seen by Dr. Benay Spice who feels patient's performance status not adequate at this time to consider  chemotherapy at present and recommended hospice care.  Oncology recommended outpatient follow-up in the cancer center in 1 to 2 weeks to reassess patient status.  Patient was discharged home with  hospice.  Paroxysmal Atrial Fibrillation -Patient maintained on flecainide 50 mg daily.  Patient was in sinus rhythm during the hospitalization.  Anticoagulation of Xarelto was discontinued due to severe thrombocytopenia.  Anticoagulation not resumed on discharge.   Right pleural effusion Questionable etiology.  Likely secondary to metastatic disease.  Patient with a platelet count of 8.  Patient on nasal cannula and not in extremis.  Follow for now.  Hypokalemia/Hypomagnesemia -Hypokalemia improved.  Potassium at 3.8.  Patient still hypomagnesemic with a magnesium of 1.8 from 1.6.  Give a dose of IV magnesium to keep magnesium greater than 2.   Hyponatremia -due to poororalintake and dehydration, improved with hydration.  IV fluids subsequently discontinued.  Patient received a few doses of IV Lasix.  Sodium level stabilized at 132 by day of discharge.   Anemia due to antineoplastic chemotherapy -This is likely anemia of chronic disease -Serum Z02 elevated, folic acid level low -Iron 25, sats 23, ferritin 4,414 -Hb/Hct now stable at 8.0 -Patient had no signs or symptoms of bleeding.  Hemoglobin stabilized.   MDD (major depressive disorder), recurrent severe, without psychosis ([place]): -Remains stable.  Patient maintained on citalopram 25 mg daily and Zyprexa 2.5 mg nightly.      Severe Protein-Calorie Malnutrition  -Appreciate Nutritionist Recc's -C/w Protein Supplement Liquid 11 ounces BID -Nutritionist is encouraging the family to bring food the patient would like.  Leukocytosis -In setting of IV steroid demargination with Decadron use secondary to malignancy; WBC trended up to 22.0and fluctuated at 20.6.   -Patient was initially on IV Decadron then oral Decadron and subsequently back to IV Decadron due to nausea and vomiting.  Patient was empirically on IV antibiotics of Zosyn and transition to oral Augmentin and back to IV Zosyn.  Patient complete a full course of  antibiotic treatment during the hospitalization.  Antibiotics were discontinued and patient remained afebrile.   AKIimproving -Initially on presentation patient had worsening kidney function which had subsequently improved.  Creatinine down to 0.95.  -Initially worsened by IV Lasix and IV Zosyn probablypatient had a lot of volume overload -Zosyn changed to Augmentin and Lasix changed to po but unable to tolerate so changed back to IV. -Renal function stabilized by day of discharge.    Procedures:  CT abdomen and pelvis 12/23/2017, 12/28/2017  CT chest 12/23/2017  CT angiogram chest 12/28/2017  CT head 12/24/2017  Chest x-ray 12/25/2017, 01/03/2018  Abdominal ultrasound 12/28/2017  Consultations:  Palliative care: Dr. Hilma Favors 12/26/2017  Oncology second opinion: Dr. Benay Spice 01/04/2018  Hematology/oncology: Dr. Audelia Hives 12/25/2017  Discharge Exam: Vitals:   01/05/18 0547 01/05/18 2042  BP: 133/81 137/67  Pulse: 87 87  Resp: 16 16  Temp: 98.2 F (36.8 C) 98.7 F (37.1 C)  SpO2: 94% 99%    General: NAD Cardiovascular: RRR Respiratory: CTAB  Discharge Instructions   Discharge Instructions    Diet general   Complete by:  As directed    Increase activity slowly   Complete by:  As directed      Allergies as of 01/06/2018   No Known Allergies     Medication List    STOP taking these medications   cholecalciferol 1000 units tablet Commonly known as:  VITAMIN D   citalopram 40 MG tablet Commonly known as:  CELEXA   dronabinol 2.5 MG  capsule Commonly known as:  MARINOL   magnesium oxide 400 (241.3 Mg) MG tablet Commonly known as:  MAG-OX   metFORMIN 500 MG tablet Commonly known as:  GLUCOPHAGE   ondansetron 8 MG tablet Commonly known as:  ZOFRAN   pantoprazole 40 MG tablet Commonly known as:  PROTONIX   polyethylene glycol packet Commonly known as:  MIRALAX / GLYCOLAX   rivaroxaban 20 MG Tabs tablet Commonly known as:  XARELTO     TAKE these  medications   alum & mag hydroxide-simeth 193-790-24 MG/5ML suspension Commonly known as:  MAALOX PLUS Take 5 mLs by mouth 3 (three) times daily before meals.   dexamethasone 1 MG/ML solution Commonly known as:  DECADRON Take 8 mLs (8 mg total) by mouth daily.   famotidine 40 MG/5ML suspension Commonly known as:  PEPCID Take 5 mLs (40 mg total) by mouth 2 (two) times daily.   fentaNYL 25 MCG/HR patch Commonly known as:  DURAGESIC - dosed mcg/hr Place 1 patch (25 mcg total) onto the skin every 3 (three) days. Start taking on:  01/08/2018   flecainide 50 MG tablet Commonly known as:  TAMBOCOR Take 1 tablet (50 mg total) by mouth daily. Please make overdue appt with Dr. Lovena Le before anymore refills. 1st attempt What changed:  additional instructions   furosemide 40 MG tablet Commonly known as:  LASIX Take one tab daily as needed for shortness or breath or as directed.   lidocaine-prilocaine cream Commonly known as:  EMLA APPLY 1 APPLICATION TO AFFECTED AREA(S) TOPICALLY AS NEEDED What changed:  See the new instructions.   LORazepam 2 MG/ML concentrated solution Commonly known as:  ATIVAN Take 0.5 mLs (1 mg total) by mouth every 6 (six) hours as needed for anxiety, sedation or sleep.   metoCLOPramide 5 MG/5ML solution Commonly known as:  REGLAN Take 5 mLs (5 mg total) by mouth 4 (four) times daily -  before meals and at bedtime.   OLANZapine zydis 5 MG disintegrating tablet Commonly known as:  ZYPREXA Take 1 tablet (5 mg total) by mouth at bedtime. Ok to crush or dissolve tablet   oxyCODONE 20 MG/ML concentrated solution Commonly known as:  ROXICODONE INTENSOL Take 0.3-0.5 mLs (6-10 mg total) by mouth every 4 (four) hours as needed for breakthrough pain.   protein supplement shake Liqd Commonly known as:  PREMIER PROTEIN Take 325 mLs (11 oz total) by mouth 2 (two) times daily between meals.            Durable Medical Equipment  (From admission, onward)          Start     Ordered   01/06/18 0859  For home use only DME Hospital bed  Once    Question Answer Comment  Patient has (list medical condition): metastatic cancer   The above medical condition requires: Patient requires the ability to reposition frequently   Bed type Semi-electric   Support Surface: Gel Overlay      01/06/18 0858   01/06/18 0859  For home use only DME 3 n 1  Once     01/06/18 0858   01/06/18 0859  For home use only DME Shower stool  Once     01/06/18 0858         No Known Allergies Follow-up Information    Ladell Pier, MD. Schedule an appointment as soon as possible for a visit in 1 week(s).   Specialty:  Oncology Contact information: [place] [address] [phone]  The results of significant diagnostics from this hospitalization (including imaging, microbiology, ancillary and laboratory) are listed below for reference.    Significant Diagnostic Studies: Dg Chest 2 View  Result Date: 12/24/2017 CLINICAL DATA:  Dizziness and lethargy EXAM: CHEST - 2 VIEW COMPARISON:  November 15, 2017 FINDINGS: Stable right Port-A-Cath. Elevation the right hemidiaphragm has worsened in the interval. The cardiomediastinal silhouette is normal. No other acute abnormalities. IMPRESSION: Elevation of the right hemidiaphragm, increased in the interval. No other acute abnormalities. Electronically Signed   By: Dorise Bullion III M.D   On: 12/24/2017 22:25   Ct Head Wo Contrast  Result Date: 12/25/2017 CLINICAL DATA:  Metastatic disease of unknown primary. Weakness and dizziness since 1600 hours. EXAM: CT HEAD WITHOUT CONTRAST TECHNIQUE: Contiguous axial images were obtained from the base of the skull through the vertex without intravenous contrast. COMPARISON:  MRI brain 10/24/2017 FINDINGS: Brain: No evidence of acute infarction, hemorrhage, hydrocephalus, extra-axial collection or mass lesion/mass effect. Vascular: No hyperdense  vessel or unexpected calcification. Skull: Normal. Negative for fracture or focal lesion. Sinuses/Orbits: Retention cyst versus polyp noted within the right maxillary sinus as before. Other: None IMPRESSION: 1. No acute intracranial abnormalities. 2. No evidence for brain metastases. Electronically Signed   By: Kerby Moors M.D.   On: 12/25/2017 00:06   Ct Chest W Contrast  Result Date: 12/24/2017 CLINICAL DATA:  Metastatic cervical cancer.  Restaging. EXAM: CT CHEST, ABDOMEN, AND PELVIS WITH CONTRAST TECHNIQUE: Multidetector CT imaging of the chest, abdomen and pelvis was performed following the standard protocol during bolus administration of intravenous contrast. CONTRAST:  12mL OMNIPAQUE IOHEXOL 300 MG/ML  SOLN COMPARISON:  None. CT AP 10/20/2017 and CT chest 03/03/2017 FINDINGS: CT CHEST FINDINGS Cardiovascular: There is a right chest wall port a catheter with tip at the cavoatrial junction. Normal heart size. No pericardial effusion. Mediastinum/Nodes: No enlarged mediastinal, hilar, or axillary lymph nodes. Small cardio phrenic angle lymph nodes appear increased from previous exam. The largest measures 1 cm, image 40/2. Previously 4 mm. Thyroid gland, trachea, and esophagus demonstrate no significant findings. Lungs/Pleura: There is a new small right pleural effusion. Asymmetric elevation of the right hemidiaphragm is again noted. There is overlying passive atelectasis and volume loss from the right lower lobe. Right apical lung nodule measures 5 mm and is new from previous exam, image 21/4. Tiny nodule within the medial right lower lobe is also new measuring 3 mm, image 75/4. Musculoskeletal: No chest wall mass or suspicious bone lesions identified. CT ABDOMEN PELVIS FINDINGS Hepatobiliary: Extensive liver metastases involving both lobes of the liver are identified the confluent mass within the right lobe of liver measures 14.7 x 6.6 by 18.7 cm, image 54/2. On the previous exam this measured 11.8 x  5.2 by 11.8 cm. Posterior segment 8 lesion measures 4.9 by 3.2 cm, image 47/2. Previously 3.6 x 2.5 cm. Multiple new lesions are identified. Lesion within segment 6 appears new measuring 2.7 x 2.4 cm, image 72/2. Lesion within the subcapsular left lobe is new measuring 3.8 x 2.8 cm, image 54/2. Mild pericholecystic fluid noted. No gallstones. No biliary dilatation. Pancreas: Unremarkable. No pancreatic ductal dilatation or surrounding inflammatory changes. Spleen: There are multiple new wedge-shaped areas of peripheral low attenuation within the spleen compatible with splenic infarcts. Adrenals/Urinary Tract: The adrenal glands appear normal. New large area of low attenuation within the inferior pole of left kidney is new measuring 4.2 by 3.1 by 4.5 cm. Wedge-shaped area of low attenuation and cortical volume loss within  the posterior right kidney is new compatible with infarct, image 68/2. No hydronephrosis. Urinary bladder appears normal. Stomach/Bowel: Stomach is normal. No abnormal small bowel dilatation identified. No pathologic dilatation of the colon. Vascular/Lymphatic: Aortic atherosclerosis. Small retroperitoneal lymph nodes are identified no adenopathy within the abdomen. No pelvic or inguinal adenopathy. Reproductive: Anteverted uterus.  No discrete mass identified. Other: Enhancing lesion within the left pelvic sidewall is again noted measuring 4.9 by 1.9 cm, image 108/2. Interval development of moderate volume ascites. New soft tissue peritoneal nodule in the right upper quadrant of the abdomen measures 1.6 cm, image 77/2. New loculated ascites overlies the right lobe of liver, image 47/2. Musculoskeletal: Mild erosive changes involving the left sacrum secondary to the left pelvic sidewall mass identified, image 124/5. IMPRESSION: 1. Interval progression of disease. 2. Significant progression of liver metastasis. 3. New abdominal ascites with loculated fluid overlying the liver and at least 1  peritoneal nodule in the right upper quadrant of the abdomen compatible with peritoneal carcinomatosis. 4. New pulmonary nodule in the right upper lobe and right lower lobe compatible with metastatic disease. 5. Multiple splenic and renal infarcts.  New from previous exam. 6. Similar appearance of left pelvic sidewall tumor with mild erosive changes involving the left sacrum. 7. These results will be called to the ordering clinician or representative by the Radiologist Assistant, and communication documented in the PACS or zVision Dashboard. Electronically Signed   By: Kerby Moors M.D.   On: 12/24/2017 03:02   Ct Angio Chest Pe W Or Wo Contrast  Result Date: 12/28/2017 CLINICAL DATA:  Metastatic squamous cell carcinoma of unknown primary. Presenting with altered mental status, functional decline over 2 weeks, fevers. Increasing lethargy. EXAM: CT ANGIOGRAPHY CHEST CT ABDOMEN AND PELVIS WITH CONTRAST TECHNIQUE: Multidetector CT imaging of the chest was performed using the standard protocol during bolus administration of intravenous contrast. Multiplanar CT image reconstructions and MIPs were obtained to evaluate the vascular anatomy. Multidetector CT imaging of the abdomen and pelvis was performed using the standard protocol during bolus administration of intravenous contrast. CONTRAST:  187mL ISOVUE-370 IOPAMIDOL (ISOVUE-370) INJECTION 76%, 47mL OMNIPAQUE IOHEXOL 300 MG/ML SOLN COMPARISON:  CT chest abdomen and pelvis 12/23/2017 FINDINGS: CTA CHEST FINDINGS Cardiovascular: Moderately good opacification of the central and segmental pulmonary arteries. No focal filling defects. No evidence of significant pulmonary embolus. Normal caliber thoracic aorta. Great vessel origins are patent. Normal heart size. No pericardial effusion. Mediastinum/Nodes: Right central venous catheter with tip in the low SVC region. Esophagus is decompressed. No significant lymphadenopathy in the chest. Lungs/Pleura: Bilateral pleural  effusions, larger on the right and increasing since previous study. Bilateral basilar consolidation or atelectasis, also greater on the right and also increasing. Possible superimposed pneumonia. Right upper lung nodule again demonstrated measuring 9 mm diameter. Evaluation of lungs is limited due to motion artifact. Airways are patent. No pneumothorax. Musculoskeletal: No destructive bone lesions in the chest. Review of the MIP images confirms the above findings. CT ABDOMEN and PELVIS FINDINGS Hepatobiliary: Multiple renal hand sing liver lesions demonstrated throughout the liver consistent with multiple hepatic metastasis. No significant change. Gallbladder is contracted. Gallbladder wall edema is likely related to liver disease. No bile duct dilatation. Pancreas: Unremarkable. No pancreatic ductal dilatation or surrounding inflammatory changes. Spleen: Wedge-shaped perfusion defects in the spleen likely represent splenic infarcts. No change. Adrenals/Urinary Tract: No adrenal gland nodules. Mild renal parenchymal scarring. Nonobstructing stone in the midpole right kidney. Heterogeneous left nephrogram with focal perfusion defect likely representing infarct. No change.  Bladder is unremarkable. Stomach/Bowel: Stomach, small bowel, and colon are not abnormally distended. No wall thickening is appreciated. Appendix is not identified. Vascular/Lymphatic: Scattered aortic calcification. No aortic aneurysm. Scattered retroperitoneal lymph nodes are not pathologically enlarged. Reproductive: Uterus and ovaries are not enlarged. Other: Diffuse abdominal and pelvic ascites, mildly increased since previous study. Mesenteric and omental nodularity consistent with peritoneal carcinomatosis. Small periumbilical hernia containing fat. Edema throughout the subcutaneous fat. Musculoskeletal: Focal sacral bone lesion likely representing metastasis. Review of the MIP images confirms the above findings. IMPRESSION: 1. No evidence of  significant pulmonary embolus. 2. Bilateral pleural effusions with basilar atelectasis and consolidation, greater on the right, and increasing since previous study. 3. Right upper lung nodule, likely metastatic, no change. 4. Multiple diffuse liver metastases, no change. 5. Diffuse abdominal ascites and mesenteric/omental nodularity consistent with peritoneal carcinomatosis. Mildly increased ascites. 6. Focal sacral bone lesion as before. 7. 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  No change. Electronically Signed   By: Lucienne Capers M.D.   On: 12/28/2017 23:13   Ct Abdomen Pelvis W Contrast  Result Date: 12/28/2017 CLINICAL DATA:  Metastatic squamous cell carcinoma of unknown primary. Presenting with altered mental status, functional decline over 2 weeks, fevers. Increasing lethargy. EXAM: CT ANGIOGRAPHY CHEST CT ABDOMEN AND PELVIS WITH CONTRAST TECHNIQUE: Multidetector CT imaging of the chest was performed using the standard protocol during bolus administration of intravenous contrast. Multiplanar CT image reconstructions and MIPs were obtained to evaluate the vascular anatomy. Multidetector CT imaging of the abdomen and pelvis was performed using the standard protocol during bolus administration of intravenous contrast. CONTRAST:  170mL ISOVUE-370 IOPAMIDOL (ISOVUE-370) INJECTION 76%, 47mL OMNIPAQUE IOHEXOL 300 MG/ML SOLN COMPARISON:  CT chest abdomen and pelvis 12/23/2017 FINDINGS: CTA CHEST FINDINGS Cardiovascular: Moderately good opacification of the central and segmental pulmonary arteries. No focal filling defects. No evidence of significant pulmonary embolus. Normal caliber thoracic aorta. Great vessel origins are patent. Normal heart size. No pericardial effusion. Mediastinum/Nodes: Right central venous catheter with tip in the low SVC region. Esophagus is decompressed. No significant lymphadenopathy in the chest. Lungs/Pleura: Bilateral pleural effusions, larger on the right and  increasing since previous study. Bilateral basilar consolidation or atelectasis, also greater on the right and also increasing. Possible superimposed pneumonia. Right upper lung nodule again demonstrated measuring 9 mm diameter. Evaluation of lungs is limited due to motion artifact. Airways are patent. No pneumothorax. Musculoskeletal: No destructive bone lesions in the chest. Review of the MIP images confirms the above findings. CT ABDOMEN and PELVIS FINDINGS Hepatobiliary: Multiple renal hand sing liver lesions demonstrated throughout the liver consistent with multiple hepatic metastasis. No significant change. Gallbladder is contracted. Gallbladder wall edema is likely related to liver disease. No bile duct dilatation. Pancreas: Unremarkable. No pancreatic ductal dilatation or surrounding inflammatory changes. Spleen: Wedge-shaped perfusion defects in the spleen likely represent splenic infarcts. No change. Adrenals/Urinary Tract: No adrenal gland nodules. Mild renal parenchymal scarring. Nonobstructing stone in the midpole right kidney. Heterogeneous left nephrogram with focal perfusion defect likely representing infarct. No change. Bladder is unremarkable. Stomach/Bowel: Stomach, small bowel, and colon are not abnormally distended. No wall thickening is appreciated. Appendix is not identified. Vascular/Lymphatic: Scattered aortic calcification. No aortic aneurysm. Scattered retroperitoneal lymph nodes are not pathologically enlarged. Reproductive: Uterus and ovaries are not enlarged. Other: Diffuse abdominal and pelvic ascites, mildly increased since previous study. Mesenteric and omental nodularity consistent with peritoneal carcinomatosis. Small periumbilical hernia containing fat. Edema throughout the subcutaneous fat. Musculoskeletal: Focal sacral bone lesion  likely representing metastasis. Review of the MIP images confirms the above findings. IMPRESSION: 1. No evidence of significant pulmonary embolus. 2.  Bilateral pleural effusions with basilar atelectasis and consolidation, greater on the right, and increasing since previous study. 3. Right upper lung nodule, likely metastatic, no change. 4. Multiple diffuse liver metastases, no change. 5. Diffuse abdominal ascites and mesenteric/omental nodularity consistent with peritoneal carcinomatosis. Mildly increased ascites. 6. Focal sacral bone lesion as before. 7. 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  No change. Electronically Signed   By: Lucienne Capers M.D.   On: 12/28/2017 23:13   Ct Abdomen Pelvis W Contrast  Result Date: 12/24/2017 CLINICAL DATA:  Metastatic cervical cancer.  Restaging. EXAM: CT CHEST, ABDOMEN, AND PELVIS WITH CONTRAST TECHNIQUE: Multidetector CT imaging of the chest, abdomen and pelvis was performed following the standard protocol during bolus administration of intravenous contrast. CONTRAST:  162mL OMNIPAQUE IOHEXOL 300 MG/ML  SOLN COMPARISON:  None. CT AP 10/20/2017 and CT chest 03/03/2017 FINDINGS: CT CHEST FINDINGS Cardiovascular: There is a right chest wall port a catheter with tip at the cavoatrial junction. Normal heart size. No pericardial effusion. Mediastinum/Nodes: No enlarged mediastinal, hilar, or axillary lymph nodes. Small cardio phrenic angle lymph nodes appear increased from previous exam. The largest measures 1 cm, image 40/2. Previously 4 mm. Thyroid gland, trachea, and esophagus demonstrate no significant findings. Lungs/Pleura: There is a new small right pleural effusion. Asymmetric elevation of the right hemidiaphragm is again noted. There is overlying passive atelectasis and volume loss from the right lower lobe. Right apical lung nodule measures 5 mm and is new from previous exam, image 21/4. Tiny nodule within the medial right lower lobe is also new measuring 3 mm, image 75/4. Musculoskeletal: No chest wall mass or suspicious bone lesions identified. CT ABDOMEN PELVIS FINDINGS Hepatobiliary: Extensive  liver metastases involving both lobes of the liver are identified the confluent mass within the right lobe of liver measures 14.7 x 6.6 by 18.7 cm, image 54/2. On the previous exam this measured 11.8 x 5.2 by 11.8 cm. Posterior segment 8 lesion measures 4.9 by 3.2 cm, image 47/2. Previously 3.6 x 2.5 cm. Multiple new lesions are identified. Lesion within segment 6 appears new measuring 2.7 x 2.4 cm, image 72/2. Lesion within the subcapsular left lobe is new measuring 3.8 x 2.8 cm, image 54/2. Mild pericholecystic fluid noted. No gallstones. No biliary dilatation. Pancreas: Unremarkable. No pancreatic ductal dilatation or surrounding inflammatory changes. Spleen: There are multiple new wedge-shaped areas of peripheral low attenuation within the spleen compatible with splenic infarcts. Adrenals/Urinary Tract: The adrenal glands appear normal. New large area of low attenuation within the inferior pole of left kidney is new measuring 4.2 by 3.1 by 4.5 cm. Wedge-shaped area of low attenuation and cortical volume loss within the posterior right kidney is new compatible with infarct, image 68/2. No hydronephrosis. Urinary bladder appears normal. Stomach/Bowel: Stomach is normal. No abnormal small bowel dilatation identified. No pathologic dilatation of the colon. Vascular/Lymphatic: Aortic atherosclerosis. Small retroperitoneal lymph nodes are identified no adenopathy within the abdomen. No pelvic or inguinal adenopathy. Reproductive: Anteverted uterus.  No discrete mass identified. Other: Enhancing lesion within the left pelvic sidewall is again noted measuring 4.9 by 1.9 cm, image 108/2. Interval development of moderate volume ascites. New soft tissue peritoneal nodule in the right upper quadrant of the abdomen measures 1.6 cm, image 77/2. New loculated ascites overlies the right lobe of liver, image 47/2. Musculoskeletal: Mild erosive changes involving the left sacrum  secondary to the left pelvic sidewall mass  identified, image 124/5. IMPRESSION: 1. Interval progression of disease. 2. Significant progression of liver metastasis. 3. New abdominal ascites with loculated fluid overlying the liver and at least 1 peritoneal nodule in the right upper quadrant of the abdomen compatible with peritoneal carcinomatosis. 4. New pulmonary nodule in the right upper lobe and right lower lobe compatible with metastatic disease. 5. Multiple splenic and renal infarcts.  New from previous exam. 6. Similar appearance of left pelvic sidewall tumor with mild erosive changes involving the left sacrum. 7. These results will be called to the ordering clinician or representative by the Radiologist Assistant, and communication documented in the PACS or zVision Dashboard. Electronically Signed   By: Kerby Moors M.D.   On: 12/24/2017 03:02   US Abdomen Limited  Result Date: 12/28/2017 CLINICAL DATA:  Metastatic cervical cancer. Evaluate for ascites and potential paracentesis. EXAM: LIMITED ABDOMEN ULTRASOUND FOR ASCITES TECHNIQUE: Limited ultrasound survey for ascites was performed in all four abdominal quadrants. COMPARISON:  CT 12/23/2017 FINDINGS: Heterogeneity in the liver compatible with known metastatic disease. Trace perihepatic fluid. Small amount of ascites in the right lower quadrant, left upper quadrant and left lower quadrant. IMPRESSION: Small ascites volume.  Paracentesis not performed. Electronically Signed   By: Markus Daft M.D.   On: 12/28/2017 17:11   Dg Chest Port 1 View  Result Date: 01/03/2018 CLINICAL DATA:  Short of breath EXAM: PORTABLE CHEST 1 VIEW COMPARISON:  12/28/2017 FINDINGS: Right jugular Port-A-Cath is stable with its tip at the cavoatrial junction. Large right pleural effusion and basilar consolidation. Left lung is clear. No pneumothorax. IMPRESSION: Right pleural effusion and basilar consolidation are not significantly changed. Electronically Signed   By: Marybelle Killings M.D.   On: 01/03/2018 20:07     Microbiology: No results found for this or any previous visit (from the past 240 hour(s)).   Labs: Basic Metabolic Panel: Recent Labs  Lab 01/01/18 0420 01/02/18 0432 01/03/18 0511 01/04/18 0430 01/05/18 0531 01/06/18 0612  NA 133* 136 135 133* 132*  --   K 3.5 3.3* 3.3* 4.0 3.8  --   CL 99 98 96* 94* 92*  --   CO2 23 26 27 27 28   --   GLUCOSE 156* 171* 125* 138* 179*  --   BUN 20 19 21* 24* 23*  --   CREATININE 1.18* 1.07* 0.97 0.95 0.91  --   CALCIUM 9.0 9.2 9.2 9.1 9.1  --   MG 1.2* 1.6* 1.5* 1.6* 1.8 1.8  PHOS 3.3 3.1 2.9 3.1  --   --    Liver Function Tests: Recent Labs  Lab 01/01/18 0420 01/02/18 0432 01/03/18 0511 01/04/18 0430 01/05/18 0531  AST 36 37 45* 45* 54*  ALT 21 21 24 24 27   ALKPHOS 181* 152* 205* 197* 188*  BILITOT 1.7* 1.3* 1.4* 1.5* 1.4*  PROT 5.7* 5.7* 5.7* 5.6* 5.4*  ALBUMIN 2.7* 2.6* 2.6* 2.7* 2.6*   No results for input(s): LIPASE, AMYLASE in the last 168 hours. No results for input(s): AMMONIA in the last 168 hours. CBC: Recent Labs  Lab 01/01/18 0420 01/02/18 0432 01/03/18 0511 01/04/18 0430 01/05/18 0531  WBC 21.7* 12.0* 21.2* 20.4* 20.6*  NEUTROABS 18.9* 10.2* 18.5* 17.6* 17.9*  HGB 8.5* 8.0* 8.2* 8.0* 7.8*  HCT 28.0* 26.1* 26.7* 26.4* 25.2*  MCV 93.6 92.6 92.4 92.6 91.0  PLT 11* 8* 10* 8* 8*   Cardiac Enzymes: No results for input(s): CKTOTAL, CKMB, CKMBINDEX, TROPONINI in  the last 168 hours. BNP: BNP (last 3 results) No results for input(s): BNP in the last 8760 hours.  ProBNP (last 3 results) No results for input(s): PROBNP in the last 8760 hours.  CBG: Recent Labs  Lab 01/03/18 1311 01/04/18 2214 01/05/18 2045 01/06/18 0816 01/06/18 1217  GLUCAP 173* 256* 304* 138* 238*       Signed:  Irine Seal MD.  [facility] 01/06/2018, 12:29 PM

## 2018-01-06 NOTE — Care Management Note (Signed)
Case Management Note  Patient Details  Name: KADASIA KASSING MRN: [id number] Date of Birth: 15-Sep-1965  Subjective/Objective:                  Discharge planning  Action/Plan: Going home with hospice per Dr. Nancy Nordmann, tct-[facility] and Palliative care-Maryanne-will take care of is aware that patient to be dcd today.  Expected Discharge Date:                  Expected Discharge Plan:  Home w Hospice Care  In-House Referral:     Discharge planning Services  CM Consult  Post Acute Care Choice:  Durable Medical Equipment, Home Health Choice offered to:  Patient  DME Arranged:  3-N-1, Shower stool, Hospital bed DME Agency:  Hospice and [place]:    [facility] Agency:     Status of Service:  Completed, signed off  If discussed at [employer] of Stay Meetings, dates discussed:    Additional Comments:  Leeroy Cha, RN 01/06/2018, 10:39 AM

## 2018-01-06 NOTE — Progress Notes (Signed)
Palliative Care Progress Note  Discussed current state and anticipated disease trajectory with Cindy Faulkner and her daughter. She continues to have band like tightness around her upper abdomen. A CXR showed a very large right pleural effusion, I discussed this with Pul;monary in detail and they reviewed her CT scan -it appears that this is atelectasis from a massively enlarged liver with diffuse metastatic disease, while there is pleural fluid it would be too difficult to tap without significant risk.  I believe we have exhausted all aggressive intervention options for her during this hospitalization. We have made improvements in her symptoms but unfortunately her cancer is progressing rapidly- I shared with them the best plan is home with hospice, take it one day at a time and they can still follow-up with oncology if she is strong enough. While we all hope for the best for Cindy Faulkner she has very limited time-I encouraged her to focus on spending time with her family and we will support her in having the best QOL for the time that she has left. Her husband Cindy Faulkner will need extra support during this very difficult time and in bereavement-this is the second wife he will lose to cancer. Her daughter Cindy Faulkner is coping exceptionally well and she and Cindy Faulkner have had meaningful conversation and are at peace with her situation.  Lane Hacker, DO Palliative Medicine [phone]

## 2018-01-06 NOTE — Progress Notes (Signed)
[facility] by Dr. Hilma Favors to assess for possible thoracentesis for dyspnea relief. Chart reviewed to include CT and X-ray images with Dr. Nelda Marseille.  She has a small right sided pleural effusion. After imaging review, her symptom burden is likely related to significant hepatomegaly.  Given her profound thrombocytopenia, enlarged liver and risk for bleeding it was felt risks outweigh benefits of thoracentesis at this time.  Appreciate Palliative Care, [facility] for their excellent care of Cindy Faulkner.    Noe Gens, NP-C  Pulmonary & Critical Care Pgr: [phone] or if no answer [phone] 01/06/2018, 12:18 PM

## 2018-01-06 NOTE — Progress Notes (Signed)
Palliative Care  I have met with Cindy Faulkner and Cindy Faulkner and discussed in detail the discharge plan including medical management, symptom management and the interface with hospice services at home. I have completed the hospital medication reconciliation for discharge and at the request of Cindy Faulkner converted when possible her medications to a liquid form-she has had difficulty swallowing pills.  Plan:   Duragesic   Oxyfast for breakthrough  PRNs for Anxiety (Ativan intensol)  Zyprexa ODT for nausea-scheduled QHS  Reglan Suspension TID for nausea/gastroparesis  Continue Decadron-suspension 63m daily  Scripts have been sent electronically to [facility]    Hospice has approved her for services and will assist in transition home.  ELane Hacker DO Palliative Medicine [phone] Time: 90 minutes Greater than 50%  of this time was spent counseling and coordinating care related to the above assessment and plan.

## 2018-01-07 ENCOUNTER — Other Ambulatory Visit: Payer: Self-pay

## 2018-01-07 ENCOUNTER — Inpatient Hospital Stay (HOSPITAL_COMMUNITY)
Admission: EM | Admit: 2018-01-07 | Discharge: 2018-01-22 | DRG: 948 | Disposition: E | Payer: 59 | Attending: Internal Medicine | Admitting: Internal Medicine

## 2018-01-07 ENCOUNTER — Encounter (HOSPITAL_COMMUNITY): Payer: Self-pay

## 2018-01-07 DIAGNOSIS — D518 Other vitamin B12 deficiency anemias: Secondary | ICD-10-CM

## 2018-01-07 DIAGNOSIS — R7989 Other specified abnormal findings of blood chemistry: Secondary | ICD-10-CM | POA: Diagnosis present

## 2018-01-07 DIAGNOSIS — D61818 Other pancytopenia: Secondary | ICD-10-CM | POA: Diagnosis not present

## 2018-01-07 DIAGNOSIS — Z7952 Long term (current) use of systemic steroids: Secondary | ICD-10-CM

## 2018-01-07 DIAGNOSIS — Z8249 Family history of ischemic heart disease and other diseases of the circulatory system: Secondary | ICD-10-CM

## 2018-01-07 DIAGNOSIS — C801 Malignant (primary) neoplasm, unspecified: Secondary | ICD-10-CM | POA: Diagnosis not present

## 2018-01-07 DIAGNOSIS — C7800 Secondary malignant neoplasm of unspecified lung: Secondary | ICD-10-CM | POA: Diagnosis not present

## 2018-01-07 DIAGNOSIS — Z79891 Long term (current) use of opiate analgesic: Secondary | ICD-10-CM

## 2018-01-07 DIAGNOSIS — I10 Essential (primary) hypertension: Secondary | ICD-10-CM | POA: Diagnosis present

## 2018-01-07 DIAGNOSIS — C786 Secondary malignant neoplasm of retroperitoneum and peritoneum: Secondary | ICD-10-CM | POA: Diagnosis present

## 2018-01-07 DIAGNOSIS — Z8673 Personal history of transient ischemic attack (TIA), and cerebral infarction without residual deficits: Secondary | ICD-10-CM

## 2018-01-07 DIAGNOSIS — C787 Secondary malignant neoplasm of liver and intrahepatic bile duct: Secondary | ICD-10-CM | POA: Diagnosis present

## 2018-01-07 DIAGNOSIS — E785 Hyperlipidemia, unspecified: Secondary | ICD-10-CM | POA: Diagnosis present

## 2018-01-07 DIAGNOSIS — Z515 Encounter for palliative care: Secondary | ICD-10-CM | POA: Diagnosis present

## 2018-01-07 DIAGNOSIS — R000 Tachycardia, unspecified: Secondary | ICD-10-CM | POA: Diagnosis not present

## 2018-01-07 DIAGNOSIS — C799 Secondary malignant neoplasm of unspecified site: Secondary | ICD-10-CM | POA: Diagnosis not present

## 2018-01-07 DIAGNOSIS — Z833 Family history of diabetes mellitus: Secondary | ICD-10-CM

## 2018-01-07 DIAGNOSIS — R0902 Hypoxemia: Secondary | ICD-10-CM | POA: Diagnosis not present

## 2018-01-07 DIAGNOSIS — C7951 Secondary malignant neoplasm of bone: Secondary | ICD-10-CM | POA: Diagnosis not present

## 2018-01-07 DIAGNOSIS — Z808 Family history of malignant neoplasm of other organs or systems: Secondary | ICD-10-CM

## 2018-01-07 DIAGNOSIS — Z823 Family history of stroke: Secondary | ICD-10-CM

## 2018-01-07 DIAGNOSIS — I4891 Unspecified atrial fibrillation: Secondary | ICD-10-CM | POA: Diagnosis present

## 2018-01-07 DIAGNOSIS — Z66 Do not resuscitate: Secondary | ICD-10-CM | POA: Diagnosis not present

## 2018-01-07 DIAGNOSIS — G9340 Encephalopathy, unspecified: Secondary | ICD-10-CM | POA: Diagnosis present

## 2018-01-07 DIAGNOSIS — G893 Neoplasm related pain (acute) (chronic): Principal | ICD-10-CM | POA: Diagnosis present

## 2018-01-07 DIAGNOSIS — Z801 Family history of malignant neoplasm of trachea, bronchus and lung: Secondary | ICD-10-CM

## 2018-01-07 DIAGNOSIS — D696 Thrombocytopenia, unspecified: Secondary | ICD-10-CM | POA: Diagnosis present

## 2018-01-07 DIAGNOSIS — K7290 Hepatic failure, unspecified without coma: Secondary | ICD-10-CM | POA: Diagnosis present

## 2018-01-07 DIAGNOSIS — Z87891 Personal history of nicotine dependence: Secondary | ICD-10-CM

## 2018-01-07 DIAGNOSIS — E538 Deficiency of other specified B group vitamins: Secondary | ICD-10-CM | POA: Diagnosis present

## 2018-01-07 DIAGNOSIS — R945 Abnormal results of liver function studies: Secondary | ICD-10-CM | POA: Diagnosis present

## 2018-01-07 DIAGNOSIS — R4182 Altered mental status, unspecified: Secondary | ICD-10-CM | POA: Diagnosis not present

## 2018-01-07 LAB — COMPREHENSIVE METABOLIC PANEL
ALK PHOS: 254 U/L — AB (ref 38–126)
ALT: 47 U/L — ABNORMAL HIGH (ref 0–44)
AST: 74 U/L — AB (ref 15–41)
Albumin: 2.5 g/dL — ABNORMAL LOW (ref 3.5–5.0)
Anion gap: 17 — ABNORMAL HIGH (ref 5–15)
BILIRUBIN TOTAL: 2.1 mg/dL — AB (ref 0.3–1.2)
BUN: 24 mg/dL — AB (ref 6–20)
CO2: 24 mmol/L (ref 22–32)
CREATININE: 0.94 mg/dL (ref 0.44–1.00)
Calcium: 9 mg/dL (ref 8.9–10.3)
Chloride: 91 mmol/L — ABNORMAL LOW (ref 98–111)
GFR calc Af Amer: >60 mL/min (ref >60)
Glucose, Bld: 138 mg/dL — ABNORMAL HIGH (ref 70–99)
Potassium: 4.1 mmol/L (ref 3.5–5.1)
Sodium: 132 mmol/L — ABNORMAL LOW (ref 135–145)
Total Protein: 5.9 g/dL — ABNORMAL LOW (ref 6.5–8.1)

## 2018-01-07 LAB — CBC WITH DIFFERENTIAL/PLATELET
Abs Immature Granulocytes: 1.18 10*3/uL — ABNORMAL HIGH (ref 0.00–0.07)
BASOS PCT: 0 %
Basophils Absolute: 0.1 10*3/uL (ref 0.0–0.1)
EOS PCT: 0 %
Eosinophils Absolute: 0 10*3/uL (ref 0.0–0.5)
HCT: 27.1 % — ABNORMAL LOW (ref 36.0–46.0)
HEMOGLOBIN: 8.4 g/dL — AB (ref 12.0–15.0)
Immature Granulocytes: 3 %
LYMPHS PCT: 8 %
Lymphs Abs: 3.1 10*3/uL (ref 0.7–4.0)
MCH: 28.1 pg (ref 26.0–34.0)
MCHC: 31 g/dL (ref 30.0–36.0)
MCV: 90.6 fL (ref 80.0–100.0)
MONO ABS: 1.3 10*3/uL — AB (ref 0.1–1.0)
Monocytes Relative: 3 %
Neutro Abs: 35.6 10*3/uL — ABNORMAL HIGH (ref 1.7–7.7)
Neutrophils Relative %: 86 %
Platelets: 12 10*3/uL — CL (ref 150–400)
RBC: 2.99 MIL/uL — AB (ref 3.87–5.11)
RDW: 19.6 % — ABNORMAL HIGH (ref 11.5–15.5)
WBC: 41.3 10*3/uL — AB (ref 4.0–10.5)
nRBC: 0.5 % — ABNORMAL HIGH (ref 0.0–0.2)

## 2018-01-07 LAB — AMMONIA: AMMONIA: 22 umol/L (ref 9–35)

## 2018-01-07 MED ORDER — FENTANYL CITRATE (PF) 100 MCG/2ML IJ SOLN: 25.0000 ug | Freq: Once | INTRAMUSCULAR | Status: DC

## 2018-01-07 MED ORDER — METOCLOPRAMIDE HCL 5 MG/5ML PO SOLN
5.0000 mg | Freq: Three times a day (TID) | ORAL | Status: DC
  Filled 2018-01-07 (×2): qty 5

## 2018-01-07 MED ORDER — GLYCOPYRROLATE 1 MG PO TABS: 1.0000 mg | ORAL_TABLET | ORAL | Status: DC | PRN

## 2018-01-07 MED ORDER — GLYCOPYRROLATE 0.2 MG/ML IJ SOLN: 0.2000 mg | INTRAMUSCULAR | Status: DC | PRN

## 2018-01-07 MED ORDER — METOCLOPRAMIDE HCL 10 MG/10ML PO SOLN
10.0000 mg | Freq: Three times a day (TID) | ORAL | Status: DC
  Filled 2018-01-07: qty 10

## 2018-01-07 MED ORDER — FENTANYL CITRATE (PF) 100 MCG/2ML IJ SOLN
25.0000 ug | INTRAMUSCULAR | Status: DC | PRN
  Administered 2018-01-07: 25 ug via INTRAVENOUS
  Filled 2018-01-07: qty 2

## 2018-01-07 MED ORDER — ONDANSETRON HCL 4 MG/2ML IJ SOLN: 4.0000 mg | Freq: Four times a day (QID) | INTRAMUSCULAR | Status: DC | PRN

## 2018-01-07 MED ORDER — RIFAXIMIN 550 MG PO TABS
550.0000 mg | ORAL_TABLET | Freq: Two times a day (BID) | ORAL | Status: DC
  Administered 2018-01-07 – 2018-01-08 (×3): 550 mg via ORAL
  Filled 2018-01-07 (×4): qty 1

## 2018-01-07 MED ORDER — BIOTENE DRY MOUTH MT LIQD: 15.0000 mL | OROMUCOSAL | Status: DC | PRN

## 2018-01-07 MED ORDER — MORPHINE SULFATE (PF) 4 MG/ML IV SOLN
4.0000 mg | Freq: Once | INTRAVENOUS | Status: DC
  Filled 2018-01-07: qty 1

## 2018-01-07 MED ORDER — LORAZEPAM 2 MG/ML IJ SOLN
1.0000 mg | Freq: Once | INTRAMUSCULAR | Status: DC
  Filled 2018-01-07: qty 1

## 2018-01-07 MED ORDER — POLYVINYL ALCOHOL 1.4 % OP SOLN
1.0000 [drp] | Freq: Four times a day (QID) | OPHTHALMIC | Status: DC | PRN
  Filled 2018-01-07: qty 15

## 2018-01-07 MED ORDER — ACETAMINOPHEN 650 MG RE SUPP: 650.0000 mg | Freq: Four times a day (QID) | RECTAL | Status: DC | PRN

## 2018-01-07 MED ORDER — METOCLOPRAMIDE HCL 5 MG/ML IJ SOLN: 10.0000 mg | Freq: Four times a day (QID) | INTRAMUSCULAR | Status: DC

## 2018-01-07 MED ORDER — DIPHENHYDRAMINE HCL 50 MG/ML IJ SOLN: 12.5000 mg | Freq: Four times a day (QID) | INTRAMUSCULAR | Status: DC | PRN

## 2018-01-07 MED ORDER — HALOPERIDOL 0.5 MG PO TABS: 0.5000 mg | ORAL_TABLET | ORAL | Status: DC | PRN

## 2018-01-07 MED ORDER — MAGNESIUM OXIDE 400 MG PO TABS: 400.0000 mg | ORAL_TABLET | Freq: Every day | ORAL | Status: DC

## 2018-01-07 MED ORDER — FENTANYL 25 MCG/HR TD PT72: 25.0000 ug | MEDICATED_PATCH | TRANSDERMAL | Status: DC

## 2018-01-07 MED ORDER — METOCLOPRAMIDE HCL 5 MG/5ML PO SOLN
5.0000 mg | Freq: Three times a day (TID) | ORAL | Status: DC
  Filled 2018-01-07: qty 10

## 2018-01-07 MED ORDER — FENTANYL CITRATE (PF) 100 MCG/2ML IJ SOLN
25.0000 ug | Freq: Once | INTRAMUSCULAR | Status: AC
  Administered 2018-01-07: 25 ug via INTRAVENOUS
  Filled 2018-01-07: qty 2

## 2018-01-07 MED ORDER — HALOPERIDOL LACTATE 2 MG/ML PO CONC
0.5000 mg | ORAL | Status: DC | PRN
  Filled 2018-01-07: qty 0.3

## 2018-01-07 MED ORDER — ACETAMINOPHEN 325 MG PO TABS: 650.0000 mg | ORAL_TABLET | Freq: Four times a day (QID) | ORAL | Status: DC | PRN

## 2018-01-07 MED ORDER — NALOXONE HCL 0.4 MG/ML IJ SOLN: 0.4000 mg | INTRAMUSCULAR | Status: DC | PRN

## 2018-01-07 MED ORDER — HALOPERIDOL LACTATE 5 MG/ML IJ SOLN
0.5000 mg | INTRAMUSCULAR | Status: DC | PRN
  Administered 2018-01-08 (×2): 0.5 mg via INTRAVENOUS
  Filled 2018-01-07 (×2): qty 1

## 2018-01-07 MED ORDER — DIPHENHYDRAMINE HCL 12.5 MG/5ML PO ELIX: 12.5000 mg | ORAL_SOLUTION | Freq: Four times a day (QID) | ORAL | Status: DC | PRN

## 2018-01-07 MED ORDER — LORAZEPAM 2 MG/ML PO CONC: 1.0000 mg | Freq: Four times a day (QID) | ORAL | Status: DC | PRN

## 2018-01-07 MED ORDER — MAGNESIUM OXIDE 400 (241.3 MG) MG PO TABS: 400.0000 mg | ORAL_TABLET | Freq: Every day | ORAL | Status: DC

## 2018-01-07 MED ORDER — FENTANYL 40 MCG/ML IV SOLN
INTRAVENOUS | Status: DC
  Administered 2018-01-07: 17:00:00 via INTRAVENOUS
  Administered 2018-01-07: 30 ug via INTRAVENOUS
  Administered 2018-01-07: 60 ug via INTRAVENOUS
  Administered 2018-01-08: 120 ug via INTRAVENOUS
  Administered 2018-01-08: 45 ug via INTRAVENOUS

## 2018-01-07 MED ORDER — FLECAINIDE ACETATE 50 MG PO TABS
50.0000 mg | ORAL_TABLET | Freq: Every day | ORAL | Status: DC
  Administered 2018-01-07 – 2018-01-08 (×2): 50 mg via ORAL
  Filled 2018-01-07 (×2): qty 1

## 2018-01-07 MED ORDER — SODIUM CHLORIDE 0.9% FLUSH: 9.0000 mL | INTRAVENOUS | Status: DC | PRN

## 2018-01-07 MED ORDER — METOCLOPRAMIDE HCL 5 MG/ML IJ SOLN
10.0000 mg | Freq: Three times a day (TID) | INTRAMUSCULAR | Status: DC
  Administered 2018-01-07 – 2018-01-08 (×5): 10 mg via INTRAVENOUS
  Filled 2018-01-07 (×5): qty 2

## 2018-01-07 MED ORDER — ONDANSETRON 4 MG PO TBDP: 4.0000 mg | ORAL_TABLET | Freq: Four times a day (QID) | ORAL | Status: DC | PRN

## 2018-01-07 MED ORDER — LIDOCAINE-PRILOCAINE 2.5-2.5 % EX CREA: 1.0000 "application " | TOPICAL_CREAM | Freq: Every day | CUTANEOUS | Status: DC | PRN

## 2018-01-07 MED ORDER — LORAZEPAM 1 MG PO TABS: 1.0000 mg | ORAL_TABLET | Freq: Four times a day (QID) | ORAL | Status: DC | PRN

## 2018-01-07 NOTE — ED Notes (Signed)
Bed: WP10 Expected date:  Expected time:  Means of arrival:  Comments: AMS Cancer pt

## 2018-01-07 NOTE — ED Triage Notes (Signed)
Her husband observed pt. To become more drowsy and confused over about the pst two hours. She has cancer; and has recently been made a hospice pt. Her husband and daughter are with  Her. She is in no distress.

## 2018-01-07 NOTE — H&P (Signed)
History and Physical    Cindy Faulkner SWN:[id number] DOB: [birth year] DOA: 12/30/2017  PCP: Lennie Odor, PA-C   Patient coming from: Home.  I have personally briefly reviewed patient's old medical records in [place]  Chief Complaint: AMS.  HPI: Cindy Faulkner is a 52 y.o. female with medical history significant of atrial fibrillation, SVT, endometrial polyp, hyperlipidemia, hypertension, radiocephalic, history of CVA, metastatic to liver and sacrum squamous cell carcinoma of unknown origin who was admitted for 2 weeks in the hospital due to acute encephalopathy and discharged yesterday back to her baseline, but returns today after her family found her to be altered and unable to follow commands.  She was acting like herself last night per family members.  She did not have any medications earlier today.  She has not overused her medications.  However, upon arrival, the patient has been with significant pain despite having a 25 mcg Duragesic patch and given 25 mcg of fentanyl IVP.  Dr. Vanita Panda spoke to the palliative care team which will evaluate the patient and admit to hospice care.  ED Course: Initial vital signs temperature 97.6 F, pulse 118, respirations 21, blood pressure 134/96 mmHg and O2 sat 97% on room air.  Her white count increased to 41.3 from 20.62 days ago with 86% neutrophils, hemoglobin was 8.4 g/dL and platelets 12.  Her transaminases, alkaline phosphatase and bilirubin have worsened.  Review of Systems: Unable to fully obtain.   Past Medical History:  Diagnosis Date  . Atrial fibrillation ([place])    a. echo 4/07: EF 60%  . Atrial tachycardia ([place])    a. s/p EPS 4/09: no inducible SVT - med Tx continued  . Cancer ([place])   . Complication of anesthesia    age 48yo, woke during procedure with GA, paralysed   . Endometrial polyp   . Hyperlipidemia   . Hypertension   . PONV (postoperative nausea and vomiting)   . Rectocele    WITHOUT MENTION OF UTERINE PROLAPSE    . Stroke ([place])   . SVD (spontaneous vaginal delivery)    x 1    Past Surgical History:  Procedure Laterality Date  . COLONOSCOPY    . HYSTEROSCOPY    . IR FLUORO GUIDE PORT INSERTION RIGHT  03/23/2017  . IR US GUIDE VASC ACCESS RIGHT  03/23/2017  . LEEP    . Pylonidal cystect    . TUBAL LIGATION    . WISDOM TOOTH EXTRACTION       reports that she quit smoking about 19 years ago. Her smoking use included cigarettes. She has a 5.00 pack-year smoking history. She has never used smokeless tobacco. She reports that she does not drink alcohol or use drugs.  No Known Allergies  Family History  Problem Relation Age of Onset  . Hypertension Father   . Diabetes Father   . Stroke Father   . Hypertension Mother   . Stroke Mother   . Cancer Mother        cervical ca  . Cancer Sister 33       uterine ca  . Cancer Maternal Grandmother        lung ca   Prior to Admission medications   Medication Sig Start Date End Date Taking? Authorizing Provider  alum & mag hydroxide-simeth (MAALOX PLUS) 400-400-40 MG/5ML suspension Take 5 mLs by mouth 3 (three) times daily before meals. 01/06/18 02/05/18 Yes Lane Hacker L, DO  dexamethasone (DEXAMETHASONE INTENSOL) 1 MG/ML solution Take 8 mLs (  8 mg total) by mouth daily. 01/06/18  Yes Acquanetta Chain, DO  famotidine (PEPCID) 40 MG/5ML suspension Take 5 mLs (40 mg total) by mouth 2 (two) times daily. 01/06/18 02/05/18 Yes Acquanetta Chain, DO  fentaNYL (DURAGESIC - DOSED MCG/HR) 25 MCG/HR patch Place 1 patch (25 mcg total) onto the skin every 3 (three) days. 01/08/18 02/07/18 Yes Acquanetta Chain, DO  Ferrous Sulfate (IRON) 325 (65 Fe) MG TABS Take 325 mg by mouth daily.   Yes [provider]  flecainide (TAMBOCOR) 50 MG tablet Take 1 tablet (50 mg total) by mouth daily. Please make overdue appt with Dr. Lovena Le before anymore refills. 1st attempt Patient taking differently: Take 50 mg by mouth daily.  11/29/17  Yes Evans Lance, MD  furosemide (LASIX) 40 MG tablet Take one tab daily as needed for shortness or breath or as directed. Patient taking differently: Take 10 mg by mouth daily. Take one tab daily as needed for shortness or breath or as directed. 01/06/18  Yes Lane Hacker L, DO  lidocaine-prilocaine (EMLA) cream APPLY 1 APPLICATION TO AFFECTED AREA(S) TOPICALLY AS NEEDED Patient taking differently: Apply 1 application topically daily as needed (port).  11/14/17  Yes Gorsuch, Ni, MD  LORazepam (ATIVAN) 2 MG/ML concentrated solution Take 0.5 mLs (1 mg total) by mouth every 6 (six) hours as needed for anxiety, sedation or sleep. 01/06/18  Yes Lane Hacker L, DO  magnesium oxide (MAG-OX) 400 MG tablet Take 400 mg by mouth daily.   Yes [provider]  metoCLOPramide (REGLAN) 5 MG/5ML solution Take 5 mLs (5 mg total) by mouth 4 (four) times daily -  before meals and at bedtime. 01/06/18 02/05/18 Yes Acquanetta Chain, DO  oxyCODONE (ROXICODONE) 5 MG immediate release tablet Take 1 tablet (5 mg total) by mouth every 4 (four) hours as needed for severe pain. 01/06/18  Yes Dellinger, Bobby Rumpf, PA-C    Physical Exam: Vitals:   01/16/2018 1240 01/11/2018 1326 01/01/2018 1443 12/27/2017 1521  BP: 126/73  (!) 151/59 (!) 151/78  Pulse: (!) 120 (!) 120 (!) 121 (!) 120  Resp: (!) 21 (!) 23 (!) 24 (!) 24  Temp:      TempSrc:      SpO2: 98% 95% 95% 98%    Constitutional: Looks chronically ill, but in NAD. Eyes: PERRL, lids and conjunctivae normal. ENMT: Mucous membranes are mildly dry. Posterior pharynx clear of any exudate or lesions. Neck: normal, supple, no masses, no thyromegaly Respiratory: Decreased breath sounds on bases, otherwise clear to auscultation bilaterally, no wheezing, no crackles. Normal respiratory effort. No accessory muscle use.  Cardiovascular: Regular rate and rhythm, no murmurs / rubs / gallops. No extremity edema. 2+ pedal pulses. No carotid bruits.  Abdomen: Obese, soft,  positive RUQ tenderness, no guarding or rebound, no masses palpated. No hepatosplenomegaly. Bowel sounds positive.  Musculoskeletal: no clubbing / cyanosis.  Good ROM, no contractures. Normal muscle tone.  Skin: Multiple areas of ecchymosis.  Unable to fully evaluate. Neurologic: CN 2-12 grossly intact. Sensation intact, DTR normal. Strength 5/5 in all 4.  Psychiatric: Normal judgment and insight. Alert and oriented x 3. Normal mood.   Labs on Admission: I have personally reviewed following labs and imaging studies  CBC: Recent Labs  Lab 01/02/18 0432 01/03/18 0511 01/04/18 0430 01/05/18 0531 12/25/2017 1207  WBC 12.0* 21.2* 20.4* 20.6* 41.3*  NEUTROABS 10.2* 18.5* 17.6* 17.9* 35.6*  HGB 8.0* 8.2* 8.0* 7.8* 8.4*  HCT 26.1* 26.7* 26.4* 25.2* 27.1*  MCV 92.6 92.4 92.6 91.0 90.6  PLT 8* 10* 8* 8* 12*   Basic Metabolic Panel: Recent Labs  Lab 01/01/18 0420 01/02/18 0432 01/03/18 0511 01/04/18 0430 01/05/18 0531 01/06/18 0612 01/15/2018 1207  NA 133* 136 135 133* 132*  --  132*  K 3.5 3.3* 3.3* 4.0 3.8  --  4.1  CL 99 98 96* 94* 92*  --  91*  CO2 23 26 27 27 28   --  24  GLUCOSE 156* 171* 125* 138* 179*  --  138*  BUN 20 19 21* 24* 23*  --  24*  CREATININE 1.18* 1.07* 0.97 0.95 0.91  --  0.94  CALCIUM 9.0 9.2 9.2 9.1 9.1  --  9.0  MG 1.2* 1.6* 1.5* 1.6* 1.8 1.8  --   PHOS 3.3 3.1 2.9 3.1  --   --   --    GFR: Estimated Creatinine Clearance: 83.1 mL/min (by C-G formula based on SCr of 0.94 mg/dL). Liver Function Tests: Recent Labs  Lab 01/02/18 0432 01/03/18 0511 01/04/18 0430 01/05/18 0531 01/18/2018 1207  AST 37 45* 45* 54* 74*  ALT 21 24 24 27  47*  ALKPHOS 152* 205* 197* 188* 254*  BILITOT 1.3* 1.4* 1.5* 1.4* 2.1*  PROT 5.7* 5.7* 5.6* 5.4* 5.9*  ALBUMIN 2.6* 2.6* 2.7* 2.6* 2.5*   No results for input(s): LIPASE, AMYLASE in the last 168 hours. Recent Labs  Lab 01/10/2018 1207  AMMONIA 22   Coagulation Profile: No results for input(s): INR, PROTIME in the last  168 hours. Cardiac Enzymes: No results for input(s): CKTOTAL, CKMB, CKMBINDEX, TROPONINI in the last 168 hours. BNP (last 3 results) No results for input(s): PROBNP in the last 8760 hours. HbA1C: No results for input(s): HGBA1C in the last 72 hours. CBG: Recent Labs  Lab 01/03/18 1311 01/04/18 2214 01/05/18 2045 01/06/18 0816 01/06/18 1217  GLUCAP 173* 256* 304* 138* 238*   Lipid Profile: No results for input(s): CHOL, HDL, LDLCALC, TRIG, CHOLHDL, LDLDIRECT in the last 72 hours. Thyroid Function Tests: No results for input(s): TSH, T4TOTAL, FREET4, T3FREE, THYROIDAB in the last 72 hours. Anemia Panel: No results for input(s): VITAMINB12, FOLATE, FERRITIN, TIBC, IRON, RETICCTPCT in the last 72 hours. Urine analysis:    Component Value Date/Time   COLORURINE BROWN (A) 12/25/2017 0630   APPEARANCEUR TURBID (A) 12/25/2017 0630   LABSPEC 1.020 12/25/2017 0630   PHURINE 6.0 12/25/2017 0630   GLUCOSEU 100 (A) 12/25/2017 0630   HGBUR NEGATIVE 12/25/2017 0630   BILIRUBINUR SMALL (A) 12/25/2017 0630   KETONESUR NEGATIVE 12/25/2017 0630   PROTEINUR NEGATIVE 12/25/2017 0630   UROBILINOGEN 0.2 11/27/2010 1122   NITRITE NEGATIVE 12/25/2017 0630   LEUKOCYTESUR NEGATIVE 12/25/2017 0630    Radiological Exams on Admission: No results found.  EKG: Independently reviewed.    Assessment/Plan Principal Problem:   Metastatic squamous cell carcinoma involving liver with unknown primary site [facility]) Observation/MedSurg for palliative care purposes. Continue Duragesic patches 25 mcg every 72 hours.  However, will use fentanyl PCA for better pain control.  Palliative care will evaluate the patient in the morning.  Active Problems:   Pancytopenia (HCC)   Abnormal LFTs I will not order further blood work.   DVT prophylaxis:  Code Status: Full code. Family Communication: Her husband, mother and daughter were in the ED. Disposition Plan: Observation for comfort care. Consults called:  Palliative care will come to evaluate. Admission status: Observation/MedSurg.   Reubin Milan MD [facility] Pager [phone].  If 7PM-7AM, please contact  night-coverage [email or web address] Password [facility]  01/17/2018, 3:47 PM

## 2018-01-07 NOTE — ED Provider Notes (Signed)
[place] DEPT Provider Note   CSN: [id number] Arrival date & time: 12/31/2017  1112     History   Chief Complaint Chief Complaint  Patient presents with  . Altered Mental Status  . Cancer    HPI Cindy Faulkner is a 52 y.o. female with a PMH of metastatic squamous cell carcinoma presenting with altered mental status. Patient's husband and daughters are the historians. Family describes patient was admitted to the hospital for 2 weeks and was discharged yesterday. According to family, patient was acting back to baseline last night, but started to become confused today at around 8am. Family reports patient was unable to follow commands, stand for long periods of time, or have a coherent conversation. Family denies any new medications and reports patient has not had any medications today yet. Patient is unable to provide a coherent history due to altered mental status.  Level 5 caveat.   HPI  Past Medical History:  Diagnosis Date  . Atrial fibrillation ([place])    a. echo 4/07: EF 60%  . Atrial tachycardia ([place])    a. s/p EPS 4/09: no inducible SVT - med Tx continued  . Cancer ([place])   . Complication of anesthesia    age 21yo, woke during procedure with GA, paralysed   . Endometrial polyp   . Hyperlipidemia   . Hypertension   . PONV (postoperative nausea and vomiting)   . Rectocele    WITHOUT MENTION OF UTERINE PROLAPSE  . Stroke ([place])   . SVD (spontaneous vaginal delivery)    x 1    Patient Active Problem List   Diagnosis Date Noted  . Acute metabolic encephalopathy 91/47/8295  . Pancytopenia ([place]) 12/25/2017  . Sepsis ([place]) 12/25/2017  . Hyponatremia 12/25/2017  . Thrombocytopenia ([place]) 12/25/2017  . Confusion   . Generalized weakness   . Severe protein-calorie malnutrition ([place]) 12/13/2017  . Malnutrition of moderate degree 11/17/2017  . MDD (major depressive disorder), recurrent severe, without psychosis ([place])   . Fever 11/15/2017  .  Intractable nausea and vomiting 11/15/2017  . UTI (urinary tract infection) 10/25/2017  . Other fatigue 10/13/2017  . Deficiency anemia 10/13/2017  . Nausea with vomiting 09/22/2017  . Cervical cancer ([place]) 07/20/2017  . Anemia due to antineoplastic chemotherapy 07/19/2017  . Hypomagnesemia 06/28/2017  . Syncopal episodes 06/28/2017  . Hypokalemia 06/20/2017  . 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([place]) 04/26/2017  . Physical deconditioning 04/26/2017  . Night sweats 04/12/2017  . Port-A-Cath in place 04/04/2017  . Neuropathy associated with cancer ([place]) 04/04/2017  . Goals of care, counseling/discussion 04/04/2017  . Metastatic squamous cell carcinoma involving liver with unknown primary site ([place]) 03/30/2017  . Metastasis to liver of unknown origin ([place]) 03/18/2017  . Dysuria 03/11/2017  . Cancer of unknown origin ([place]) 03/11/2017  . Metastasis to liver ([place]) 03/03/2017  . Sacral mass 03/02/2017  . Cancer associated pain 03/02/2017  . Mild single current episode of major depressive disorder ([place]) 08/23/2016  . Cerebral thrombosis with cerebral infarction 12/05/2015  . Stroke (cerebrum) ([place]) 12/05/2015  . Obstructive sleep apnea 07/24/2015  . Preoperative evaluation to rule out surgical contraindication 10/04/2011  . Chest pain, unspecified 06/24/2010  . Shortness of breath 06/24/2010  . Essential hypertension 10/20/2008  . RECTOCELE WITHOUT MENTION OF UTERINE PROLAPSE 10/20/2008  . ENDOMETRIAL POLYP 10/20/2008  . AF (paroxysmal atrial fibrillation) ([place]) 10/20/2008    Past Surgical History:  Procedure Laterality Date  . COLONOSCOPY    . HYSTEROSCOPY    . IR  FLUORO GUIDE PORT INSERTION RIGHT  03/23/2017  . IR US GUIDE VASC ACCESS RIGHT  03/23/2017  . LEEP    . Pylonidal cystect    . TUBAL LIGATION    . WISDOM TOOTH EXTRACTION       OB History   None      Home Medications    Prior to Admission medications   Medication Sig Start Date End Date Taking? Authorizing Provider    alum & mag hydroxide-simeth (MAALOX PLUS) 400-400-40 MG/5ML suspension Take 5 mLs by mouth 3 (three) times daily before meals. 01/06/18 02/05/18 Yes Lane Hacker L, DO  dexamethasone (DEXAMETHASONE INTENSOL) 1 MG/ML solution Take 8 mLs (8 mg total) by mouth daily. 01/06/18  Yes Acquanetta Chain, DO  famotidine (PEPCID) 40 MG/5ML suspension Take 5 mLs (40 mg total) by mouth 2 (two) times daily. 01/06/18 02/05/18 Yes Acquanetta Chain, DO  fentaNYL (DURAGESIC - DOSED MCG/HR) 25 MCG/HR patch Place 1 patch (25 mcg total) onto the skin every 3 (three) days. 01/08/18 02/07/18 Yes Acquanetta Chain, DO  Ferrous Sulfate (IRON) 325 (65 Fe) MG TABS Take 325 mg by mouth daily.   Yes [provider]  flecainide (TAMBOCOR) 50 MG tablet Take 1 tablet (50 mg total) by mouth daily. Please make overdue appt with Dr. Lovena Le before anymore refills. 1st attempt Patient taking differently: Take 50 mg by mouth daily.  11/29/17  Yes Evans Lance, MD  furosemide (LASIX) 40 MG tablet Take one tab daily as needed for shortness or breath or as directed. Patient taking differently: Take 10 mg by mouth daily. Take one tab daily as needed for shortness or breath or as directed. 01/06/18  Yes Lane Hacker L, DO  lidocaine-prilocaine (EMLA) cream APPLY 1 APPLICATION TO AFFECTED AREA(S) TOPICALLY AS NEEDED Patient taking differently: Apply 1 application topically daily as needed (port).  11/14/17  Yes Gorsuch, Ni, MD  LORazepam (ATIVAN) 2 MG/ML concentrated solution Take 0.5 mLs (1 mg total) by mouth every 6 (six) hours as needed for anxiety, sedation or sleep. 01/06/18  Yes Lane Hacker L, DO  magnesium oxide (MAG-OX) 400 MG tablet Take 400 mg by mouth daily.   Yes [provider]  metoCLOPramide (REGLAN) 5 MG/5ML solution Take 5 mLs (5 mg total) by mouth 4 (four) times daily -  before meals and at bedtime. 01/06/18 02/05/18 Yes Acquanetta Chain, DO  oxyCODONE (ROXICODONE) 5 MG  immediate release tablet Take 1 tablet (5 mg total) by mouth every 4 (four) hours as needed for severe pain. 01/06/18  Yes Dellinger, Bobby Rumpf, PA-C    Family History Family History  Problem Relation Age of Onset  . Hypertension Father   . Diabetes Father   . Stroke Father   . Hypertension Mother   . Stroke Mother   . Cancer Mother        cervical ca  . Cancer Sister 42       uterine ca  . Cancer Maternal Grandmother        lung ca    Social History Social History   Tobacco Use  . Smoking status: Former Smoker    Packs/day: 0.50    Years: 10.00    Pack years: 5.00    Types: Cigarettes    Last attempt to quit: 02/22/1998    Years since quitting: 19.8  . Smokeless tobacco: Never Used  Substance Use Topics  . Alcohol use: No  . Drug use: No     Allergies  Patient has no known allergies.   Review of Systems Review of Systems  Unable to perform ROS: Mental status change     Physical Exam Updated Vital Signs BP 126/73   Pulse (!) 120   Temp 97.6 F (36.4 C) (Oral)   Resp (!) 23   LMP 02/12/2012   SpO2 95%   Physical Exam  Constitutional: She is oriented to person, place, and time. She appears well-developed and well-nourished. No distress.  Patient makes poor eye contact and appears confused at times.   HENT:  Head: Normocephalic and atraumatic.  Eyes: Pupils are equal, round, and reactive to light. Scleral icterus (Slight scleral icterus noted. ) is present.  Neck: Normal range of motion.  Cardiovascular: Regular rhythm and normal heart sounds. Tachycardia present. Exam reveals no gallop and no friction rub.  No murmur heard. Pulmonary/Chest: Effort normal and breath sounds normal. No respiratory distress. She has no wheezes. She has no rales.  Abdominal: Soft. She exhibits no distension. There is tenderness in the right upper quadrant and epigastric area. There is no rigidity, no rebound and no guarding.  Musculoskeletal: Normal range of motion.    Neurological: She is alert and oriented to person, place, and time.  Skin: Skin is warm. No rash noted. She is not diaphoretic. No erythema.  Psychiatric: She has a normal mood and affect. Her speech is normal. Cognition and memory are impaired.  Nursing note and vitals reviewed.  Mental Status:  Alert and oriented to person and place, but not alert to time. Patient is not able to provide a coherent history. Speech fluent without evidence of aphasia. Patient is not able to follow 2 step commands without difficulty. Patient is confused throughout the exam. Cranial Nerves:  II: Pupils equal, round, reactive to light III,IV, VI: ptosis not present, not able to assess extra-ocular motions since patient is unable to follow some commands V,VII: smile symmetric, facial light touch sensation equal VIII: hearing grossly normal to voice  X: uvula elevates symmetrically  XI: bilateral shoulder shrug symmetric and strong XII: midline tongue extension without fassiculations Motor:  Normal tone. 5/5 in upper and lower extremities bilaterally including strong and equal grip strength and dorsiflexion/plantar flexion Sensory: light touch normal in all extremities.  Cerebellar: patient is unable to follow commands to perform finger-to-nose with bilateral upper extremities CV: distal pulses palpable throughout      ED Treatments / Results  Labs (all labs ordered are listed, but only abnormal results are displayed) Labs Reviewed  COMPREHENSIVE METABOLIC PANEL - Abnormal; Notable for the following components:      Result Value   Sodium 132 (*)    Chloride 91 (*)    Glucose, Bld 138 (*)    BUN 24 (*)    Total Protein 5.9 (*)    Albumin 2.5 (*)    AST 74 (*)    ALT 47 (*)    Alkaline Phosphatase 254 (*)    Total Bilirubin 2.1 (*)    Anion gap 17 (*)    All other components within normal limits  CBC WITH DIFFERENTIAL/PLATELET - Abnormal; Notable for the following components:   WBC 41.3 (*)     RBC 2.99 (*)    Hemoglobin 8.4 (*)    HCT 27.1 (*)    RDW 19.6 (*)    Platelets 12 (*)    nRBC 0.5 (*)    Neutro Abs 35.6 (*)    Monocytes Absolute 1.3 (*)    Abs Immature Granulocytes 1.18 (*)  All other components within normal limits  AMMONIA    EKG EKG Interpretation  Date/Time:  Saturday January 07 2018 11:24:35 EST Ventricular Rate:  127 PR Interval:    QRS Duration: 85 QT Interval:  323 QTC Calculation: 470 R Axis:   22 Text Interpretation:  Sinus tachycardia Abnormal ekg Confirmed by Carmin Muskrat [phone]) on 01/18/2018 11:28:11 AM   Radiology No results found.  Procedures Procedures (including critical care time)  Medications Ordered in ED Medications  fentaNYL (SUBLIMAZE) injection 25 mcg (has no administration in time range)  acetaminophen (TYLENOL) tablet 650 mg (has no administration in time range)    Or  acetaminophen (TYLENOL) suppository 650 mg (has no administration in time range)  haloperidol (HALDOL) tablet 0.5 mg (has no administration in time range)    Or  haloperidol (HALDOL) 2 MG/ML solution 0.5 mg (has no administration in time range)    Or  haloperidol lactate (HALDOL) injection 0.5 mg (has no administration in time range)  ondansetron (ZOFRAN-ODT) disintegrating tablet 4 mg (has no administration in time range)    Or  ondansetron (ZOFRAN) injection 4 mg (has no administration in time range)  glycopyrrolate (ROBINUL) tablet 1 mg (has no administration in time range)    Or  glycopyrrolate (ROBINUL) injection 0.2 mg (has no administration in time range)    Or  glycopyrrolate (ROBINUL) injection 0.2 mg (has no administration in time range)  antiseptic oral rinse (BIOTENE) solution 15 mL (has no administration in time range)  polyvinyl alcohol (LIQUIFILM TEARS) 1.4 % ophthalmic solution 1 drop (has no administration in time range)  fentaNYL (SUBLIMAZE) injection 25 mcg (25 mcg Intravenous Given 01/18/2018 1237)     Initial Impression /  Assessment and Plan / ED Course  I have reviewed the triage vital signs and the nursing notes.  Pertinent labs & imaging results that were available during my care of the patient were reviewed by me and considered in my medical decision making (see chart for details).  Clinical Course as of Jan 08 1416  Sat Jan 07, 2018  1221 Tachycardia likely due to intermittent confusion and agitation.   Pulse Rate(!): 118 [AH]  1351 Leukocytosis doubled since discharge yesterday.  Platelets are also critically low. Patient will need to be admitted.   WBC(!): 41.3 [AH]  1354 Ammonia level is within normal limits.   Ammonia [AH]  1354 Liver enzymes elevated likely due to squamous cell carcinoma metastasis.  Comprehensive metabolic panel(!) [AH]    Clinical Course User Index [AH] Arville Lime, PA-C   Patient presents with complaint of altered mental status. Patient is intermittently alert and does not provide a coherent history or an appropriate physical exam. Lab values are highly abnormal and patient will benefit from being admitted for further care.   Labs: Ordered CMP, CBC, and ammonia level to evaluate for electrolyte abnormalities, liver function, and change in WBCs from previous visit. Patient's labs are worse than at discharge yesterday. Will admit patient to stabilize and provide care.   Therapeutics: Provided Fentanyl to provide some pain relief. Patient uses Fentanyl at home for agitation and pain control.   Assessment/Plan: Patient is a 1F with metastatic cancer. Suspect abnormal lab values and altered mental status are due to cancer. Will consult palliative care and hospice. Dr. Vanita Panda discussed case with patient's family. Family appears to understand and agree with plan. Discussed case with hospitalist and hospitalist has agreed to admit patient.      Final Clinical Impressions(s) / ED Diagnoses  Final diagnoses:  Metastatic cancer [facility])    ED Discharge Orders    None        Arville Lime, [place] 01/04/2018 1418    Carmin Muskrat, MD 01/08/18 1549

## 2018-01-07 NOTE — ED Notes (Signed)
ED TO INPATIENT HANDOFF REPORT  Name/Age/Gender Cindy Faulkner 52 y.o. female  Code Status    Code Status Orders  (From admission, onward)         Start     Ordered   12/30/2017 1411  Do not attempt resuscitation (DNR)  Continuous    Question Answer Comment  In the event of cardiac or respiratory ARREST Do not call a "code blue"   In the event of cardiac or respiratory ARREST Do not perform Intubation, CPR, defibrillation or ACLS   In the event of cardiac or respiratory ARREST Use medication by any route, position, wound care, and other measures to relive pain and suffering. May use oxygen, suction and manual treatment of airway obstruction as needed for comfort.      01/05/2018 1412        Code Status History    Date Active Date Inactive Code Status Order ID Comments User Context   01/17/2018 1407 01/20/2018 1412 DNR [id number]  Carmin Muskrat, MD ED   12/26/2017 1520 01/06/2018 1603 DNR [id number]  Acquanetta Chain, DO Inpatient   12/25/2017 0250 12/26/2017 1519 Full Code [id number]  Ivor Costa, MD ED   11/15/2017 1437 11/18/2017 1736 Full Code [id number]  [name], MD Inpatient   03/21/2017 1031 04/01/2017 1552 Full Code [id number]  [name], MD Inpatient   12/05/2015 2346 12/06/2015 2130 Full Code [id number]  Rise Patience, MD ED      Home/SNF/Other Home  Chief Complaint Altered mental status  Level of Care/Admitting Diagnosis ED Disposition    ED Disposition Condition [facility] Area: [facility] [[id number]]  Level of Care: Med-Surg [16]  Diagnosis: Metastatic squamous cell carcinoma involving liver with unknown primary site [facility]) [[id number]]  Admitting Physician: Reubin Milan [[id number]]  Attending Physician: Reubin Milan [[id number]]  PT Class (Do Not Modify): Observation [104]  PT Acc Code (Do Not Modify): Observation [10022]       Medical History Past Medical History:  Diagnosis Date  . Atrial fibrillation  ([place])    a. echo 4/07: EF 60%  . Atrial tachycardia ([place])    a. s/p EPS 4/09: no inducible SVT - med Tx continued  . Cancer ([place])   . Complication of anesthesia    age 46yo, woke during procedure with GA, paralysed   . Endometrial polyp   . Hyperlipidemia   . Hypertension   . PONV (postoperative nausea and vomiting)   . Rectocele    WITHOUT MENTION OF UTERINE PROLAPSE  . Stroke ([place])   . SVD (spontaneous vaginal delivery)    x 1    Allergies No Known Allergies  IV Location/Drains/Wounds Patient Lines/Drains/Airways Status   Active Line/Drains/Airways    Name:   Placement date:   Placement time:   Site:   Days:   Implanted Port 05/17/17 Right Chest   05/17/17    0916    Chest   235   External Urinary Catheter   01/19/2018    1126    -   less than 1          Labs/Imaging Results for orders placed or performed during the hospital encounter of 01/11/2018 (from the past 48 hour(s))  Comprehensive metabolic panel     Status: Abnormal   Collection Time: 01/16/2018 12:07 PM  Result Value Ref Range   Sodium 132 (L) 135 - 145 mmol/L   Potassium 4.1 3.5 - 5.1 mmol/L   Chloride 91 (L)  98 - 111 mmol/L   CO2 24 22 - 32 mmol/L   Glucose, Bld 138 (H) 70 - 99 mg/dL   BUN 24 (H) 6 - 20 mg/dL   Creatinine, Ser 0.94 0.44 - 1.00 mg/dL   Calcium 9.0 8.9 - 10.3 mg/dL   Total Protein 5.9 (L) 6.5 - 8.1 g/dL   Albumin 2.5 (L) 3.5 - 5.0 g/dL   AST 74 (H) 15 - 41 U/L   ALT 47 (H) 0 - 44 U/L   Alkaline Phosphatase 254 (H) 38 - 126 U/L   Total Bilirubin 2.1 (H) 0.3 - 1.2 mg/dL   GFR calc non Af Amer >60 >60 mL/min   GFR calc Af Amer >60 >60 mL/min    Comment: (NOTE) The eGFR has been calculated using the CKD EPI equation. This calculation has not been validated in all clinical situations. eGFR's persistently <60 mL/min signify possible Chronic Kidney Disease.    Anion gap 17 (H) 5 - 15    Comment: Performed at [facility], [address] [place], [place] [address]  CBC  with Differential     Status: Abnormal   Collection Time: 12/24/2017 12:07 PM  Result Value Ref Range   WBC 41.3 (H) 4.0 - 10.5 K/uL   RBC 2.99 (L) 3.87 - 5.11 MIL/uL   Hemoglobin 8.4 (L) 12.0 - 15.0 g/dL   HCT 27.1 (L) 36.0 - 46.0 %   MCV 90.6 80.0 - 100.0 fL   MCH 28.1 26.0 - 34.0 pg   MCHC 31.0 30.0 - 36.0 g/dL   RDW 19.6 (H) 11.5 - 15.5 %   Platelets 12 (LL) 150 - 400 K/uL    Comment: Immature Platelet Fraction may be clinically indicated, consider ordering this additional test [id number] THIS CRITICAL RESULT HAS VERIFIED AND BEEN CALLED TO [facility] BY MARINDA BLACK ON 11 16 2019 AT 1337, AND HAS BEEN READ BACK.     nRBC 0.5 (H) 0.0 - 0.2 %   Neutrophils Relative % 86 %   Neutro Abs 35.6 (H) 1.7 - 7.7 K/uL   Lymphocytes Relative 8 %   Lymphs Abs 3.1 0.7 - 4.0 K/uL   Monocytes Relative 3 %   Monocytes Absolute 1.3 (H) 0.1 - 1.0 K/uL   Eosinophils Relative 0 %   Eosinophils Absolute 0.0 0.0 - 0.5 K/uL   Basophils Relative 0 %   Basophils Absolute 0.1 0.0 - 0.1 K/uL   Immature Granulocytes 3 %   Abs Immature Granulocytes 1.18 (H) 0.00 - 0.07 K/uL    Comment: Performed at [facility], [address] [place], [place] [address]  Ammonia     Status: None   Collection Time: 12/28/2017 12:07 PM  Result Value Ref Range   Ammonia 22 9 - 35 umol/L    Comment: Performed at [facility], [address] ,  [address]   No results found. EKG Interpretation  Date/Time:  Saturday January 07 2018 11:24:35 EST Ventricular Rate:  127 PR Interval:    QRS Duration: 85 QT Interval:  323 QTC Calculation: 470 R Axis:   22 Text Interpretation:  Sinus tachycardia Abnormal ekg Confirmed by Lockwood, Robert ([id number]) on 01/06/2018 11:28:11 AM   Pending Labs Unresulted Labs (From admission, onward)   None      Vitals/Pain Today's Vitals   01/21/2018 1326 01/10/2018 1423 01/21/2018 1443 01/13/2018 1521  BP:   (!) 151/59 (!) 151/78  Pulse: (!) 120   (!) 121 (!) 120    Resp: (!) 23  (!) 24 (!) 24  Temp:      TempSrc:      SpO2: 95%  95% 98%  PainSc:  4       Isolation Precautions No active isolations  Medications Medications  fentaNYL (SUBLIMAZE) injection 25 mcg (25 mcg Intravenous Given 12/31/2017 1423)  acetaminophen (TYLENOL) tablet 650 mg (has no administration in time range)    Or  acetaminophen (TYLENOL) suppository 650 mg (has no administration in time range)  haloperidol (HALDOL) tablet 0.5 mg (has no administration in time range)    Or  haloperidol (HALDOL) 2 MG/ML solution 0.5 mg (has no administration in time range)    Or  haloperidol lactate (HALDOL) injection 0.5 mg (has no administration in time range)  ondansetron (ZOFRAN-ODT) disintegrating tablet 4 mg (has no administration in time range)    Or  ondansetron (ZOFRAN) injection 4 mg (has no administration in time range)  glycopyrrolate (ROBINUL) tablet 1 mg (has no administration in time range)    Or  glycopyrrolate (ROBINUL) injection 0.2 mg (has no administration in time range)    Or  glycopyrrolate (ROBINUL) injection 0.2 mg (has no administration in time range)  antiseptic oral rinse (BIOTENE) solution 15 mL (has no administration in time range)  polyvinyl alcohol (LIQUIFILM TEARS) 1.4 % ophthalmic solution 1 drop (has no administration in time range)  fentaNYL (SUBLIMAZE) injection 25 mcg (has no administration in time range)  fentaNYL (SUBLIMAZE) injection 25 mcg (25 mcg Intravenous Given 12/31/2017 1237)    Mobility walks with person assist 

## 2018-01-08 DIAGNOSIS — R4182 Altered mental status, unspecified: Secondary | ICD-10-CM | POA: Diagnosis present

## 2018-01-08 DIAGNOSIS — C801 Malignant (primary) neoplasm, unspecified: Secondary | ICD-10-CM

## 2018-01-08 DIAGNOSIS — Z8673 Personal history of transient ischemic attack (TIA), and cerebral infarction without residual deficits: Secondary | ICD-10-CM | POA: Diagnosis not present

## 2018-01-08 DIAGNOSIS — D61818 Other pancytopenia: Secondary | ICD-10-CM | POA: Diagnosis not present

## 2018-01-08 DIAGNOSIS — C787 Secondary malignant neoplasm of liver and intrahepatic bile duct: Secondary | ICD-10-CM | POA: Diagnosis not present

## 2018-01-08 DIAGNOSIS — C786 Secondary malignant neoplasm of retroperitoneum and peritoneum: Secondary | ICD-10-CM | POA: Diagnosis present

## 2018-01-08 DIAGNOSIS — I4891 Unspecified atrial fibrillation: Secondary | ICD-10-CM | POA: Diagnosis present

## 2018-01-08 DIAGNOSIS — E785 Hyperlipidemia, unspecified: Secondary | ICD-10-CM | POA: Diagnosis present

## 2018-01-08 DIAGNOSIS — Z79891 Long term (current) use of opiate analgesic: Secondary | ICD-10-CM | POA: Diagnosis not present

## 2018-01-08 DIAGNOSIS — Z8249 Family history of ischemic heart disease and other diseases of the circulatory system: Secondary | ICD-10-CM | POA: Diagnosis not present

## 2018-01-08 DIAGNOSIS — Z823 Family history of stroke: Secondary | ICD-10-CM | POA: Diagnosis not present

## 2018-01-08 DIAGNOSIS — Z87891 Personal history of nicotine dependence: Secondary | ICD-10-CM | POA: Diagnosis not present

## 2018-01-08 DIAGNOSIS — Z833 Family history of diabetes mellitus: Secondary | ICD-10-CM | POA: Diagnosis not present

## 2018-01-08 DIAGNOSIS — E538 Deficiency of other specified B group vitamins: Secondary | ICD-10-CM | POA: Diagnosis present

## 2018-01-08 DIAGNOSIS — C7800 Secondary malignant neoplasm of unspecified lung: Secondary | ICD-10-CM | POA: Diagnosis present

## 2018-01-08 DIAGNOSIS — Z801 Family history of malignant neoplasm of trachea, bronchus and lung: Secondary | ICD-10-CM | POA: Diagnosis not present

## 2018-01-08 DIAGNOSIS — D518 Other vitamin B12 deficiency anemias: Secondary | ICD-10-CM | POA: Diagnosis not present

## 2018-01-08 DIAGNOSIS — Z515 Encounter for palliative care: Secondary | ICD-10-CM | POA: Diagnosis present

## 2018-01-08 DIAGNOSIS — I10 Essential (primary) hypertension: Secondary | ICD-10-CM | POA: Diagnosis present

## 2018-01-08 DIAGNOSIS — Z808 Family history of malignant neoplasm of other organs or systems: Secondary | ICD-10-CM | POA: Diagnosis not present

## 2018-01-08 DIAGNOSIS — G9340 Encephalopathy, unspecified: Secondary | ICD-10-CM | POA: Diagnosis present

## 2018-01-08 DIAGNOSIS — G893 Neoplasm related pain (acute) (chronic): Secondary | ICD-10-CM | POA: Diagnosis present

## 2018-01-08 DIAGNOSIS — Z66 Do not resuscitate: Secondary | ICD-10-CM | POA: Diagnosis not present

## 2018-01-08 DIAGNOSIS — K7290 Hepatic failure, unspecified without coma: Secondary | ICD-10-CM | POA: Diagnosis present

## 2018-01-08 DIAGNOSIS — C7951 Secondary malignant neoplasm of bone: Secondary | ICD-10-CM | POA: Diagnosis present

## 2018-01-08 MED ORDER — FOLIC ACID 1 MG PO TABS
1.0000 mg | ORAL_TABLET | Freq: Every day | ORAL | Status: DC
  Administered 2018-01-08: 1 mg via ORAL
  Filled 2018-01-08: qty 1

## 2018-01-08 MED ORDER — DEXAMETHASONE SODIUM PHOSPHATE 10 MG/ML IJ SOLN
8.0000 mg | INTRAMUSCULAR | Status: DC
  Administered 2018-01-08: 8 mg via INTRAVENOUS
  Filled 2018-01-08: qty 1

## 2018-01-08 MED ORDER — FENTANYL CITRATE (PF) 100 MCG/2ML IJ SOLN: 50.0000 ug | INTRAMUSCULAR | Status: DC | PRN

## 2018-01-08 MED ORDER — OXYCODONE HCL 5 MG PO TABS
10.0000 mg | ORAL_TABLET | Freq: Once | ORAL | Status: AC
  Administered 2018-01-08: 10 mg via ORAL
  Filled 2018-01-08: qty 2

## 2018-01-08 MED ORDER — FENTANYL BOLUS VIA INFUSION
50.0000 ug | INTRAVENOUS | Status: DC | PRN
  Administered 2018-01-09: 50 ug via INTRAVENOUS
  Filled 2018-01-08: qty 100

## 2018-01-08 MED ORDER — FENTANYL 2500MCG IN NS 250ML (10MCG/ML) PREMIX INFUSION
25.0000 ug/h | INTRAVENOUS | Status: DC
  Administered 2018-01-08: 25 ug/h via INTRAVENOUS
  Filled 2018-01-08: qty 250

## 2018-01-08 MED ORDER — SODIUM CHLORIDE 0.9 % IV SOLN
25.0000 ug/h | INTRAVENOUS | Status: DC
  Administered 2018-01-08 (×2): 25 ug/h via INTRAVENOUS
  Filled 2018-01-08 (×2): qty 1

## 2018-01-08 MED ORDER — CYANOCOBALAMIN 1000 MCG/ML IJ SOLN: 1000.0000 ug | Freq: Once | INTRAMUSCULAR | Status: DC

## 2018-01-08 MED ORDER — CYANOCOBALAMIN 1000 MCG/ML IJ SOLN: 1000.0000 ug | Freq: Every day | INTRAMUSCULAR | Status: DC

## 2018-01-08 MED ORDER — CYANOCOBALAMIN 1000 MCG/ML IJ SOLN
1000.0000 ug | Freq: Every day | INTRAMUSCULAR | Status: DC
  Filled 2018-01-08: qty 1

## 2018-01-08 MED ORDER — CYANOCOBALAMIN 1000 MCG/ML IJ SOLN
1000.0000 ug | Freq: Every day | INTRAMUSCULAR | Status: DC
  Administered 2018-01-08: 1000 ug via SUBCUTANEOUS
  Filled 2018-01-08 (×2): qty 1

## 2018-01-08 MED ORDER — OLANZAPINE 5 MG PO TBDP
5.0000 mg | ORAL_TABLET | Freq: Every day | ORAL | Status: DC
  Administered 2018-01-08: 5 mg via ORAL
  Filled 2018-01-08 (×2): qty 1

## 2018-01-08 MED ORDER — LIP MEDEX EX OINT
TOPICAL_OINTMENT | CUTANEOUS | Status: AC
  Administered 2018-01-08: 12:00:00
  Filled 2018-01-08: qty 7

## 2018-01-08 NOTE — Progress Notes (Signed)
Palliative Care Re-admission Note  Cindy Faulkner's condition deteriorated 24 hours after being at home. Hospice was unable to admit her to services on the day of her discharge on 11/15 and she returned to the before they could see her.I expressed my concerns about this plan to the [facility] liaison. Cindy Faulkner has worsening live failure from metastatic disease, her pain has dramatically escalated and she is more confused/encephalopathic.  I discussed situation with EDP and given Cindy Faulkner's history and recent prolonged hospital stay - we will admit her for comfort care.   I recommend the following for symptom management:  1. Initiate Fentanyl infusion, remove transdermal, will use bolus and titrate as needed for pain relief.  2. Persistent and worsening nausea, missed steroid dose today. She has symptomatic liver mets and nausea and it has largely been unresponsive to most nausea medications without significant over sedation. I am recommending we start an octreotide infusion to control her abdominal pain and nausea symptoms- worth doing at least a trial to see if this helps. She will be very sensitive to sedating drugs given her liver involvement. Resume steroids in AM.  3, Confusion/Hepatic Encephalopathy- For encephalopathy started Rifaxamin.  4,. I stopped benzos in case those caused confusion- she seemed to handle haldol very well- this will help with her agitation and and nausea too.  5. Cindy Faulkner is at EOL- will continue to support intervention aimed at complete comfort and QOL.  Lane Hacker, DO Palliative Medicine [phone]

## 2018-01-08 NOTE — Progress Notes (Signed)
Daily Progress Note   Patient Name: Cindy Faulkner       Date: 01/08/2018 DOB: [birth year]  Age: 52 y.o. MRN#: [id number] Attending Physician: Acquanetta Chain, DO Primary Care Physician: Lennie Odor, PA-C Admit Date: 12/30/2017  Reason for Consultation/Follow-up: Terminal Care  Subjective: Cindy Faulkner is confused, grimacing. Family gathered around the bedside. Re-admitted yesterday after declining rapidly at home. I have discussed with Cindy Faulkner and the family at bedside that a complete focus on comfort is the best we can do for Cindy Faulkner at this point.   Length of Stay: 0  Current Medications: Scheduled Meds:  . dexamethasone  8 mg Intravenous Q24H  . lip balm      . metoCLOPramide (REGLAN) injection  10 mg Intravenous TID  . OLANZapine zydis  5 mg Oral QHS  . rifaximin  550 mg Oral BID    Continuous Infusions: . fentaNYL infusion INTRAVENOUS 50 mcg/hr (01/08/18 1222)  . octreotide  (SANDOSTATIN)    IV infusion 25 mcg/hr (01/08/18 7096)    PRN Meds: acetaminophen **OR** acetaminophen, antiseptic oral rinse, fentaNYL, glycopyrrolate **OR** glycopyrrolate **OR** glycopyrrolate, haloperidol **OR** haloperidol **OR** haloperidol lactate, lidocaine-prilocaine, polyvinyl alcohol  Physical Exam          Vital Signs: BP 124/60 (BP Location: Left Arm)   Pulse 98   Temp 98.4 F (36.9 C) (Oral)   Resp 14   LMP 02/12/2012   SpO2 96%  SpO2: SpO2: 96 % O2 Device: O2 Device: Nasal Cannula O2 Flow Rate: O2 Flow Rate (L/min): 2.5 L/min  Intake/output summary:   Intake/Output Summary (Last 24 hours) at 01/08/2018 1235 Last data filed at 01/08/2018 2836 Gross per 24 hour  Intake 59.75 ml  Output -  Net 59.75 ml   LBM: Last BM Date: 01/01/2018 Baseline Weight:   Most recent weight:           Palliative Assessment/Data:      Patient Active Problem List   Diagnosis Date Noted  . Metastatic squamous cell carcinoma involving liver with unknown primary site ([place]) 01/06/2018  . Abnormal LFTs 12/23/2017  . Acute metabolic encephalopathy 62/94/7654  . Pancytopenia ([place]) 12/25/2017  . Sepsis ([place]) 12/25/2017  . Hyponatremia 12/25/2017  . Thrombocytopenia ([place]) 12/25/2017  . Confusion   . Generalized weakness   . Severe protein-calorie malnutrition ([place]) 12/13/2017  .  Malnutrition of moderate degree 11/17/2017  . MDD (major depressive disorder), recurrent severe, without psychosis ([place])   . Fever 11/15/2017  . Intractable nausea and vomiting 11/15/2017  . UTI (urinary tract infection) 10/25/2017  . Other fatigue 10/13/2017  . Deficiency anemia 10/13/2017  . Nausea with vomiting 09/22/2017  . Cervical cancer ([place]) 07/20/2017  . Anemia due to antineoplastic chemotherapy 07/19/2017  . Hypomagnesemia 06/28/2017  . Syncopal episodes 06/28/2017  . Hypokalemia 06/20/2017  . 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([place]) 04/26/2017  . Physical deconditioning 04/26/2017  . Night sweats 04/12/2017  . Port-A-Cath in place 04/04/2017  . Neuropathy associated with cancer ([place]) 04/04/2017  . Goals of care, counseling/discussion 04/04/2017  . Metastasis to liver of unknown origin ([place]) 03/18/2017  . Dysuria 03/11/2017  . Cancer of unknown origin ([place]) 03/11/2017  . Metastasis to liver ([place]) 03/03/2017  . Sacral mass 03/02/2017  . Cancer associated pain 03/02/2017  . Mild single current episode of major depressive disorder ([place]) 08/23/2016  . Cerebral thrombosis with cerebral infarction 12/05/2015  . Stroke (cerebrum) ([place]) 12/05/2015  . Obstructive sleep apnea 07/24/2015  . Preoperative evaluation to rule out surgical contraindication 10/04/2011  . Chest pain, unspecified 06/24/2010  . Shortness of breath 06/24/2010  . Essential hypertension 10/20/2008  . RECTOCELE WITHOUT MENTION OF UTERINE  PROLAPSE 10/20/2008  . ENDOMETRIAL POLYP 10/20/2008  . AF (paroxysmal atrial fibrillation) ([place]) 10/20/2008    Palliative Care Assessment & Plan   Patient Profile: 52 yo with metastatic cancer of unknown primary, end stage now in need of terminal care.  Assessment: Cindy Faulkner is dramatically different than on Friday. She is actively dying. Family are in the room saying goodbye and making peace.We can do better on her symptom control. I have discussed a detailed plan with Cindy Griffins RN for infusion titration. Will continue octreotide until Fentanyl has adequately controlled her symptoms. Recommendations/Plan:  Comfort Care  Fentanyl infusion titration and bolus dosing.  Octreotide infusion for now for liver pain-symptoms  Haldol for agitation  Goals of Care and Additional Recommendations:  Limitations on Scope of Treatment: Full Comfort Care  Code Status:    Code Status Orders  (From admission, onward)         Start     Ordered   01/11/2018 1935  Do not attempt resuscitation (DNR)  Continuous    Question Answer Comment  In the event of cardiac or respiratory ARREST Do not call a "code blue"   In the event of cardiac or respiratory ARREST Do not perform Intubation, CPR, defibrillation or ACLS   In the event of cardiac or respiratory ARREST Use medication by any route, position, wound care, and other measures to relive pain and suffering. May use oxygen, suction and manual treatment of airway obstruction as needed for comfort.      01/18/2018 1934        Code Status History    Date Active Date Inactive Code Status Order ID Comments User Context   01/12/2018 1412 12/30/2017 1934 DNR [id number]  Reubin Milan, MD ED   01/12/2018 1407 01/15/2018 1412 DNR [id number]  Carmin Muskrat, MD ED   12/26/2017 1520 01/06/2018 1603 DNR [id number]  Acquanetta Chain, DO Inpatient   12/25/2017 0250 12/26/2017 1519 Full Code [id number]  Ivor Costa, MD ED   11/15/2017 1437 11/18/2017 1736 Full Code  [id number]  [name], MD Inpatient   03/21/2017 1031 04/01/2017 1552 Full Code [id number]  [name], MD Inpatient   12/05/2015 2346 12/06/2015 2130 Full Code [id number]  Hal Hope,  Doreatha Lew, MD ED       Prognosis:   Hours - Days  Discharge Planning:  I anticipate a hospital death-we discussed hospice facility but family does not want her moved at this time given her level of symptoms  Care plan was discussed with family, RN. I also discussed with [facility] assuming attending given complete palliative care status.  Thank you for allowing the Palliative Medicine Team to assist in the care of this patient.   Time: 35 min    Greater than 50%  of this time was spent counseling and coordinating care related to the above assessment and plan.  Lane Hacker, DO  Please contact Palliative Medicine Team phone at [phone] for questions and concerns.

## 2018-01-08 NOTE — Progress Notes (Signed)
Cindy Faulkner is well-known to our oncology service. She carries a diagnosis of widely metastatic squamous cell carcinoma, of likely cervical origin. She was readmitted yesterday for confusion after being home just 24 hours. My understanding is that she did well for the first 12-24 hours and became agitated and confused. At the family's request, I reviewed her laboratory studies from yesterday. A review of the laboratory studies suggest a mild elevation in hepatic transaminases and increasing total bilirubin. Her renal function is normal. Her ammonia level is normal. Although she had a difficult night, she is considerably more comfortable on the fentanyl PCA.  Haloperidol was given in between for agitation. She appears significantly better even lucid, but drowsy. She recognized me and answered questions in appropriate fashion. Ongoing symptom management is well managed by Dr. Hilma Favors. The family's primary concern is comfort and allowing her to be discharged as soon as she is stable. They, the family, would like to see some form of physical therapy while hospitalized. They also confided that they would prefer supportive measures including blood work and nutritional support.  Her vitamin B12 level was previously high. This likely reflects an acute phase-type reaction, because her vitamin B12 metabolite, methylmalonic acid was low and that generally reflects a deficiency which may be contributing to her low platelet count. I am comfortable starting vitamin B12 once daily for 6 days and folic acid as an adjunct in the hope of stimulating hematopoiesis. The risk is inconsequential since both vitamins are soluble and nontoxic. We will continue to follow her progress while hospitalized and recommend further as appropriate.  Cindy Leber, MD Hematology/Oncology

## 2018-01-09 DIAGNOSIS — R180 Malignant ascites: Secondary | ICD-10-CM

## 2018-01-09 MED ORDER — LORAZEPAM 2 MG/ML IJ SOLN
0.5000 mg | Freq: Once | INTRAMUSCULAR | Status: AC
  Administered 2018-01-09: 0.5 mg via INTRAVENOUS
  Filled 2018-01-09: qty 1

## 2018-01-09 MED ORDER — LORAZEPAM 2 MG/ML IJ SOLN
1.0000 mg | Freq: Once | INTRAMUSCULAR | Status: DC
  Filled 2018-01-09: qty 1

## 2018-01-11 ENCOUNTER — Other Ambulatory Visit: Payer: Self-pay | Admitting: *Deleted

## 2018-01-11 ENCOUNTER — Ambulatory Visit (HOSPITAL_COMMUNITY): Payer: 59

## 2018-01-11 NOTE — Patient Outreach (Signed)
[facility]) Care Management  01/11/2018  Cindy Faulkner [facility] 1965/07/24 [id number]   Received [facility] Transition of care referral on 01/06/18.  Per chart review patient expired on January 19, 2018 and will proceed with case closure.        [name] H. Annia Friendly, BSN, [place] Management [facility] Telephonic CM Phone: [phone] Fax: [phone]

## 2018-01-13 ENCOUNTER — Ambulatory Visit: Payer: Self-pay | Admitting: Oncology

## 2018-01-13 ENCOUNTER — Ambulatory Visit: Payer: Self-pay | Admitting: Physician Assistant

## 2018-01-22 NOTE — Progress Notes (Addendum)
Patient expired at [place] following an episode of terminal agitation.  Patient continued to report increased anxiety, was extremely restless and uncomfortable. Comfort care was provided during this time and MD was notified for symptom relief.   Patient and family agreed with DNR plan prior to patient's death.  Death was witnessed by patient's husband, niece and this Therapist, sports.  Time of death was called and witnessed by 2 RN's.  Patient had absence of respirations, pulse, and responsiveness.  Pupils remained fixed.  MD and [facility] were notified.  Organ donation services was called.  Family is now in room.  After family leaves, patient will receive post-mortem care/eye prep and be transported to morgue.Roderick Pee

## 2018-01-22 NOTE — Discharge Summary (Signed)
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 Recently discharged home on 11/15 with a plan for home hospice services, but she returned the following day to [facility] with altered mental status worsening pain and confusion before hospice could admit her for home services. She was found to have worsening LFTs, elevated WBC and clinical findings significant for hepatic encephalopathy, cancer related pain and terminal agitation.  Hospital Course:  Patient was admitted from the ED after a discussion was had with the family and she was transitioned to full comfort care. She was placed on an IV Fentanyl infusion, IV decadron and octreotide for severe symptomatic abdominal pain.Attempted to give Rifaximin for hepatic encephalopathy for symptom management, but she was unable to swallow pills without vomiting. She expired on 11/17 at 0148 with family and RN at the bedside. RN notified Attending Service and pronounced patient.   I reached out to family to express condolences, provider bereavement support and to answer their questions .  Time: 30 min- No charge  Signed:  Lane Hacker  [facility] 2018-01-15, 11:16 AM

## 2018-01-22 NOTE — Plan of Care (Signed)
Patient expired 0148 this am.

## 2018-01-22 NOTE — Progress Notes (Signed)
Patient is experiencing a rapid heart rate suddenly.  Manual pulse check was 130 beats per minute. Sandostatin held.  Bolus of pain medication given. MD notified regarding elevated pulse and anxiety.  Waiting for a response at this time. Roderick Pee

## 2018-01-22 DEATH — deceased

## 2018-01-23 ENCOUNTER — Ambulatory Visit: Payer: Self-pay | Admitting: Hematology and Oncology

## 2018-01-23 ENCOUNTER — Other Ambulatory Visit: Payer: Self-pay

## 2018-01-23 ENCOUNTER — Ambulatory Visit: Payer: Self-pay

## 2018-03-06 ENCOUNTER — Ambulatory Visit: Payer: 59 | Admitting: Neurology

## 2018-03-13 ENCOUNTER — Ambulatory Visit: Payer: 59 | Admitting: Neurology

## 2018-03-13 ENCOUNTER — Encounter

## 2018-03-16 ENCOUNTER — Ambulatory Visit: Payer: 59 | Admitting: Neurology

## 2018-03-16 ENCOUNTER — Encounter

## 2020-03-01 IMAGING — MR MR HEAD WO/W CM
12 of 14 series · 39 of 48 positions shown · IV contrast (multihance)
Comparison: CT HEAD October 24, 2017 and MRI of the [facility]

CLINICAL DATA: Feeling unwell, symptoms similar to prior stroke.
Nausea and vomiting for 3 weeks. History of metastatic cervical
cancer, on chemotherapy, hypertension, hyperlipidemia.

EXAM:
MRI HEAD WITHOUT AND WITH CONTRAST
TECHNIQUE: Multiplanar, multiecho pulse sequences of the brain and surrounding
structures were obtained without and with intravenous contrast.
CONTRAST:  20mL MULTIHANCE GADOBENATE DIMEGLUMINE 529 MG/ML IV SOLN

[Series 5: ax dwi_tracew · axial · 3.0mm · 1.50mm/px · z∈[-80,+64]mm · 7 of 100 slices shown]
[im 1/100]
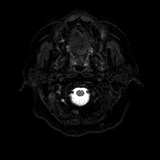
[im 17/100]
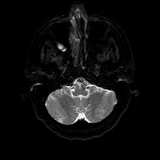
[im 34/100]
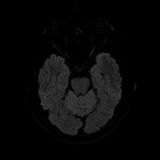
[im 50/100]
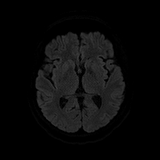
[im 67/100]
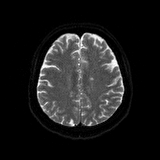
[im 83/100]
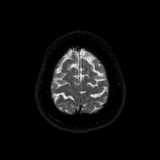
[im 100/100]
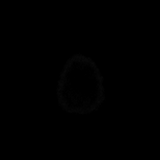

[Series 6: ax dwi_adc · axial · 3.0mm · 1.50mm/px · z∈[-80,+64]mm · 4 of 50 slices shown]
[im 1/50]
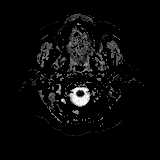
[im 17/50]
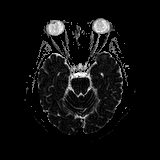
[im 33/50]
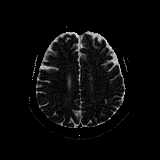
[im 50/50]
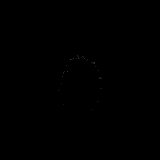

[Series 7: cor dwi_tracew · coronal · 5.0mm · 1.44mm/px · 5 of 64 slices shown]
[im 1/64]
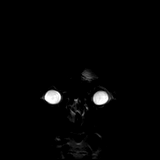
[im 16/64]
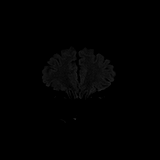
[im 32/64]
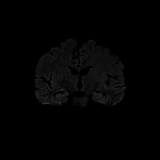
[im 48/64]
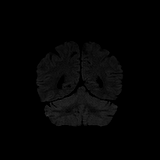
[im 64/64]
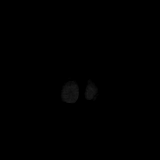

[Series 8: cor dwi_adc · coronal · 5.0mm · 1.44mm/px · 2 of 31 slices shown]
[im 1/31]
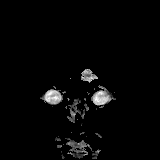
[im 31/31]
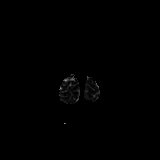

[Series 9: T1 · sagittal · 5.0mm · 0.75mm/px · 2 of 25 slices shown]
[im 1/25]
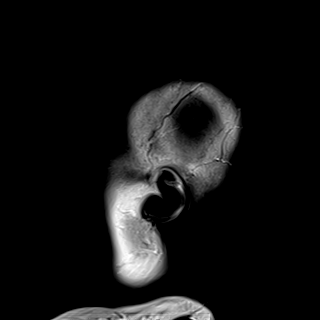
[im 25/25]
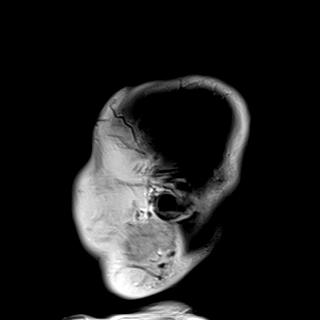

[Series 10: T2 · axial · 5.0mm · 0.72mm/px · z∈[-82,+60]mm · 2 of 25 slices shown]
[im 1/25]
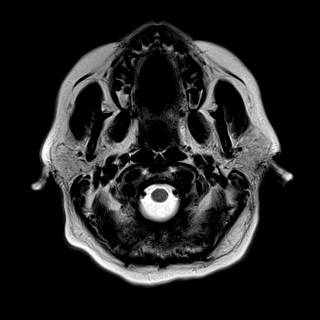
[im 25/25]
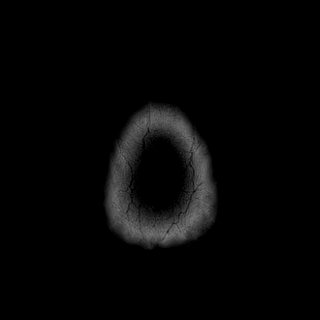

[Series 12: swi_images · axial · 3.0mm · 0.90mm/px · z∈[-95,+79]mm · 5 of 60 slices shown]
[im 1/60]
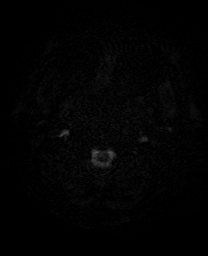
[im 15/60]
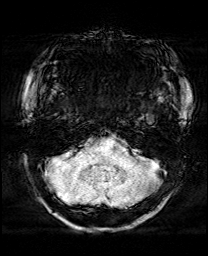
[im 30/60]
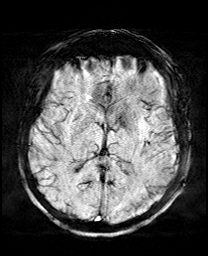
[im 45/60]
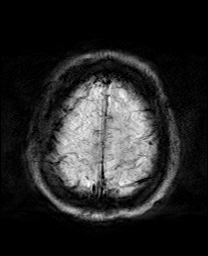
[im 60/60]
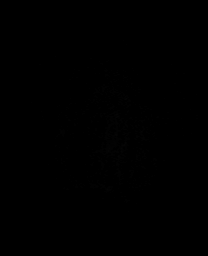

[Series 13: mip_images(sw) · axial · 24.0mm · 0.90mm/px · z∈[-85,+68]mm · 4 of 53 slices shown]
[im 1/53]
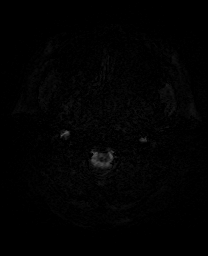
[im 18/53]
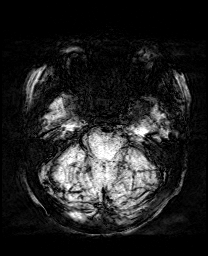
[im 35/53]
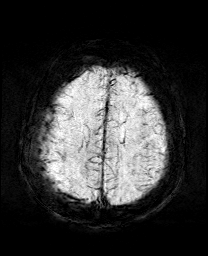
[im 53/53]
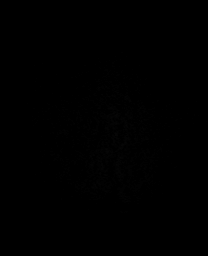

[Series 21: FLAIR · axial · 5.0mm · 0.45mm/px · z∈[-87,+55]mm · 2 of 25 slices shown]
[im 1/25]
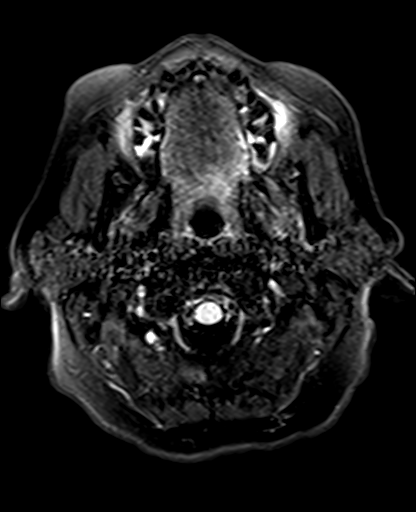
[im 25/25]
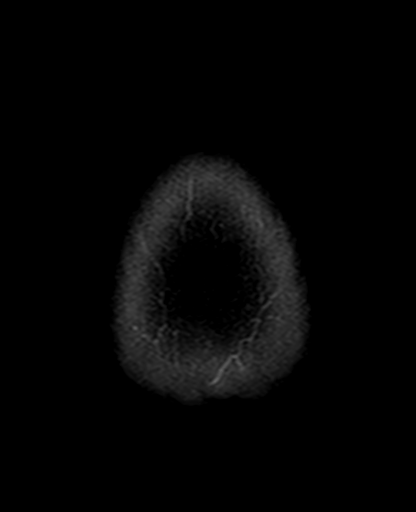

[Series 22: T2 post-contrast · coronal · 5.0mm · 0.72mm/px · 2 of 28 slices shown]
[im 1/28]
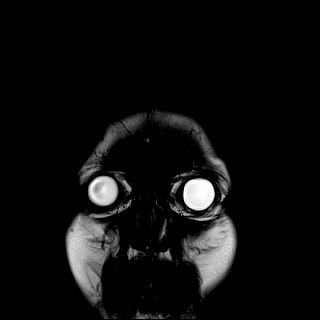
[im 28/28]
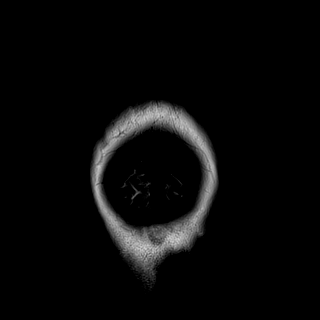

[Series 25: T1 post-contrast · coronal · 5.0mm · 0.34mm/px · 2 of 28 slices shown (1 of 2)]
[im 1/28]
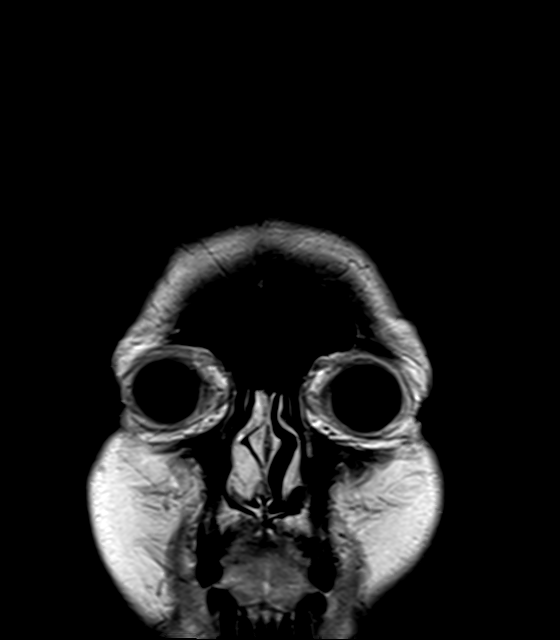
[im 28/28]
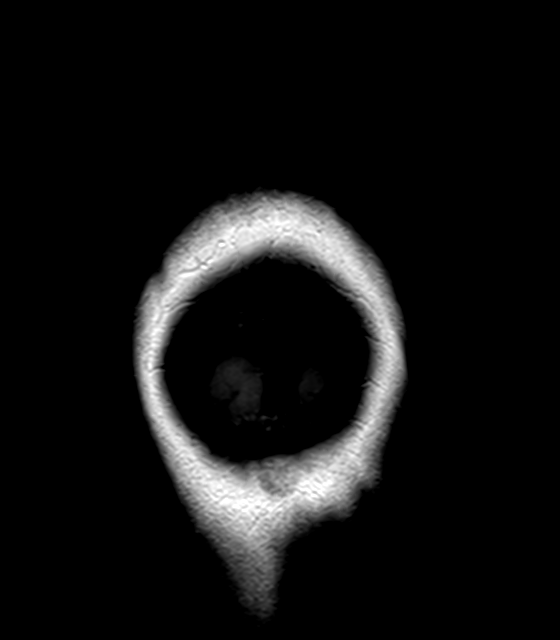

[Series 26: T1 post-contrast · sagittal · 5.0mm · 0.72mm/px · 2 of 25 slices shown (2 of 2)]
[im 1/25]
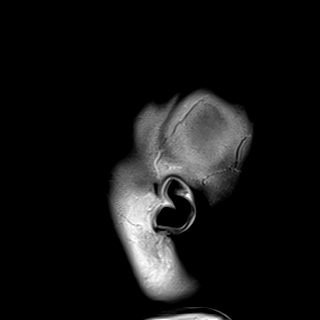
[im 25/25]
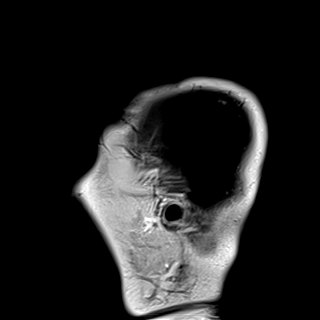

[39 of 48 positions shown; findings below may reference images not displayed]

FINDINGS: INTRACRANIAL CONTENTS: No reduced diffusion to suggest acute
ischemia or hypercellular tumor. No susceptibility artifact to
suggest hemorrhage. Mild parenchymal brain volume loss. No
hydrocephalus. Old bilateral basal ganglia lacunar infarcts. A few
scattered subcentimeter supratentorial white matter FLAIR T2
hyperintensities compatible with mild chronic small vessel ischemic
changes. No suspicious parenchymal signal, masses, mass effect. No
abnormal intraparenchymal or extra-axial enhancement. No abnormal
extra-axial fluid collections. No extra-axial masses.

VASCULAR: Normal major intracranial vascular flow voids present at
skull base.

SKULL AND UPPER CERVICAL SPINE: No abnormal sellar expansion.
Similar heterogeneous bone marrow signal, potentially treatment
related. Craniocervical junction maintained.

SINUSES/ORBITS: RIGHT maxillary mucosal retention cyst. Minimal
fluid signal LEFT mastoid air cell..The included ocular globes and
orbital contents are non-suspicious.

OTHER: None.
IMPRESSION: 1. No acute intracranial process or metastatic disease.
2. Old basal ganglia lacunar infarcts and mild chronic small vessel
ischemic changes.
3. Mild parenchymal brain volume loss.
